# Patient Record
Sex: Male | Born: 1950 | Race: Black or African American | Hispanic: No | Smoking: Current some day smoker
Health system: Southern US, Community
[De-identification: ages and names within clinical notes are randomized; demographics above are authoritative.]

## PROBLEM LIST (undated history)

## (undated) DIAGNOSIS — I82402 Acute embolism and thrombosis of unspecified deep veins of left lower extremity: Secondary | ICD-10-CM

## (undated) DIAGNOSIS — R0902 Hypoxemia: Secondary | ICD-10-CM

## (undated) DIAGNOSIS — M549 Dorsalgia, unspecified: Secondary | ICD-10-CM

## (undated) DIAGNOSIS — G8929 Other chronic pain: Secondary | ICD-10-CM

## (undated) DIAGNOSIS — J439 Emphysema, unspecified: Secondary | ICD-10-CM

## (undated) DIAGNOSIS — G4700 Insomnia, unspecified: Secondary | ICD-10-CM

## (undated) DIAGNOSIS — J9620 Acute and chronic respiratory failure, unspecified whether with hypoxia or hypercapnia: Secondary | ICD-10-CM

## (undated) DIAGNOSIS — M25559 Pain in unspecified hip: Secondary | ICD-10-CM

## (undated) DIAGNOSIS — S069X9A Unspecified intracranial injury with loss of consciousness of unspecified duration, initial encounter: Secondary | ICD-10-CM

## (undated) DIAGNOSIS — M109 Gout, unspecified: Secondary | ICD-10-CM

## (undated) DIAGNOSIS — J302 Other seasonal allergic rhinitis: Secondary | ICD-10-CM

## (undated) DIAGNOSIS — I2510 Atherosclerotic heart disease of native coronary artery without angina pectoris: Secondary | ICD-10-CM

## (undated) DIAGNOSIS — K5900 Constipation, unspecified: Secondary | ICD-10-CM

## (undated) DIAGNOSIS — C801 Malignant (primary) neoplasm, unspecified: Secondary | ICD-10-CM

## (undated) DIAGNOSIS — Z87442 Personal history of urinary calculi: Secondary | ICD-10-CM

## (undated) DIAGNOSIS — G4730 Sleep apnea, unspecified: Secondary | ICD-10-CM

## (undated) DIAGNOSIS — N400 Enlarged prostate without lower urinary tract symptoms: Secondary | ICD-10-CM

## (undated) DIAGNOSIS — M1990 Unspecified osteoarthritis, unspecified site: Secondary | ICD-10-CM

## (undated) DIAGNOSIS — R0600 Dyspnea, unspecified: Secondary | ICD-10-CM

## (undated) DIAGNOSIS — Z87891 Personal history of nicotine dependence: Secondary | ICD-10-CM

## (undated) DIAGNOSIS — I2699 Other pulmonary embolism without acute cor pulmonale: Secondary | ICD-10-CM

## (undated) DIAGNOSIS — F419 Anxiety disorder, unspecified: Secondary | ICD-10-CM

## (undated) DIAGNOSIS — Z9981 Dependence on supplemental oxygen: Secondary | ICD-10-CM

## (undated) DIAGNOSIS — N2889 Other specified disorders of kidney and ureter: Secondary | ICD-10-CM

## (undated) DIAGNOSIS — D649 Anemia, unspecified: Secondary | ICD-10-CM

## (undated) DIAGNOSIS — J9601 Acute respiratory failure with hypoxia: Secondary | ICD-10-CM

## (undated) DIAGNOSIS — R310 Gross hematuria: Secondary | ICD-10-CM

## (undated) DIAGNOSIS — R0603 Acute respiratory distress: Secondary | ICD-10-CM

## (undated) DIAGNOSIS — I10 Essential (primary) hypertension: Secondary | ICD-10-CM

## (undated) DIAGNOSIS — I700 Atherosclerosis of aorta: Secondary | ICD-10-CM

## (undated) DIAGNOSIS — J441 Chronic obstructive pulmonary disease with (acute) exacerbation: Secondary | ICD-10-CM

## (undated) DIAGNOSIS — K219 Gastro-esophageal reflux disease without esophagitis: Secondary | ICD-10-CM

## (undated) DIAGNOSIS — J189 Pneumonia, unspecified organism: Secondary | ICD-10-CM

## (undated) DIAGNOSIS — R7303 Prediabetes: Secondary | ICD-10-CM

## (undated) HISTORY — DX: Other pulmonary embolism without acute cor pulmonale: I26.99

## (undated) HISTORY — DX: Gross hematuria: R31.0

## (undated) HISTORY — DX: Chronic obstructive pulmonary disease with (acute) exacerbation: J44.1

## (undated) HISTORY — DX: Essential (primary) hypertension: I10

## (undated) HISTORY — DX: Sleep apnea, unspecified: G47.30

## (undated) HISTORY — DX: Other specified disorders of kidney and ureter: N28.89

## (undated) HISTORY — DX: Other seasonal allergic rhinitis: J30.2

## (undated) HISTORY — DX: Hypoxemia: R09.02

## (undated) HISTORY — DX: Atherosclerosis of aorta: I70.0

## (undated) HISTORY — DX: Acute respiratory failure with hypoxia: J96.01

## (undated) HISTORY — DX: Anxiety disorder, unspecified: F41.9

## (undated) HISTORY — DX: Personal history of nicotine dependence: Z87.891

## (undated) HISTORY — DX: Acute and chronic respiratory failure, unspecified whether with hypoxia or hypercapnia: J96.20

## (undated) HISTORY — DX: Atherosclerotic heart disease of native coronary artery without angina pectoris: I25.10

## (undated) HISTORY — DX: Gastro-esophageal reflux disease without esophagitis: K21.9

## (undated) HISTORY — DX: Acute respiratory distress: R06.03

## (undated) HISTORY — DX: Acute embolism and thrombosis of unspecified deep veins of left lower extremity: I82.402

## (undated) HISTORY — DX: Benign prostatic hyperplasia without lower urinary tract symptoms: N40.0

## (undated) HISTORY — DX: Gout, unspecified: M10.9

---

## 1974-07-02 HISTORY — PX: MANDIBLE SURGERY: SHX707

## 1975-07-03 HISTORY — PX: HERNIA REPAIR: SHX51

## 2014-04-22 ENCOUNTER — Emergency Department (HOSPITAL_COMMUNITY)
Admission: EM | Admit: 2014-04-22 | Discharge: 2014-04-22 | Discharge status: Absent without leave | Payer: No Typology Code available for payment source | Source: Home / Self Care | Attending: Emergency Medicine | Admitting: Emergency Medicine

## 2014-04-22 ENCOUNTER — Emergency Department (HOSPITAL_COMMUNITY): Payer: No Typology Code available for payment source

## 2014-04-22 ENCOUNTER — Emergency Department (HOSPITAL_COMMUNITY)
Admission: EM | Admit: 2014-04-22 | Discharge: 2014-04-22 | Disposition: A | Discharge status: Home / Self Care | Payer: No Typology Code available for payment source | Attending: Emergency Medicine | Admitting: Emergency Medicine

## 2014-04-22 ENCOUNTER — Encounter (HOSPITAL_COMMUNITY): Payer: Self-pay | Admitting: Emergency Medicine

## 2014-04-22 DIAGNOSIS — R079 Chest pain, unspecified: Secondary | ICD-10-CM | POA: Insufficient documentation

## 2014-04-22 DIAGNOSIS — J441 Chronic obstructive pulmonary disease with (acute) exacerbation: Secondary | ICD-10-CM | POA: Insufficient documentation

## 2014-04-22 DIAGNOSIS — Z8739 Personal history of other diseases of the musculoskeletal system and connective tissue: Secondary | ICD-10-CM | POA: Diagnosis not present

## 2014-04-22 DIAGNOSIS — G8929 Other chronic pain: Secondary | ICD-10-CM | POA: Insufficient documentation

## 2014-04-22 DIAGNOSIS — Z720 Tobacco use: Secondary | ICD-10-CM | POA: Diagnosis not present

## 2014-04-22 DIAGNOSIS — R0602 Shortness of breath: Secondary | ICD-10-CM | POA: Diagnosis present

## 2014-04-22 HISTORY — DX: Unspecified osteoarthritis, unspecified site: M19.90

## 2014-04-22 HISTORY — DX: Other chronic pain: G89.29

## 2014-04-22 HISTORY — DX: Pain in unspecified hip: M25.559

## 2014-04-22 HISTORY — DX: Dorsalgia, unspecified: M54.9

## 2014-04-22 LAB — I-STAT TROPONIN, ED
Troponin i, poc: 0 ng/mL (ref 0.00–0.08)
Troponin i, poc: 0 ng/mL (ref 0.00–0.08)

## 2014-04-22 LAB — BASIC METABOLIC PANEL
Anion gap: 13 (ref 5–15)
BUN: 16 mg/dL (ref 6–23)
CALCIUM: 9.2 mg/dL (ref 8.4–10.5)
CO2: 19 mEq/L (ref 19–32)
CREATININE: 1.2 mg/dL (ref 0.50–1.35)
Chloride: 102 mEq/L (ref 96–112)
GFR calc Af Amer: 73 mL/min — ABNORMAL LOW (ref >90)
GFR, EST NON AFRICAN AMERICAN: 63 mL/min — AB (ref >90)
GLUCOSE: 113 mg/dL — AB (ref 70–99)
Potassium: 4.4 mEq/L (ref 3.7–5.3)
Sodium: 134 mEq/L — ABNORMAL LOW (ref 137–147)

## 2014-04-22 LAB — CBC
HEMATOCRIT: 48.7 % (ref 39.0–52.0)
Hemoglobin: 16.6 g/dL (ref 13.0–17.0)
MCH: 30 pg (ref 26.0–34.0)
MCHC: 34.1 g/dL (ref 30.0–36.0)
MCV: 87.9 fL (ref 78.0–100.0)
Platelets: 134 10*3/uL — ABNORMAL LOW (ref 150–400)
RBC: 5.54 MIL/uL (ref 4.22–5.81)
RDW: 13.4 % (ref 11.5–15.5)
WBC: 4.1 10*3/uL (ref 4.0–10.5)

## 2014-04-22 MED ORDER — AZITHROMYCIN 250 MG PO TABS: 250.0000 mg | ORAL_TABLET | Freq: Every day | ORAL | Status: DC

## 2014-04-22 MED ORDER — ALBUTEROL (5 MG/ML) CONTINUOUS INHALATION SOLN
10.0000 mg/h | INHALATION_SOLUTION | Freq: Once | RESPIRATORY_TRACT | Status: AC
  Administered 2014-04-22: 10 mg/h via RESPIRATORY_TRACT
  Filled 2014-04-22: qty 20

## 2014-04-22 MED ORDER — ALBUTEROL SULFATE HFA 108 (90 BASE) MCG/ACT IN AERS
INHALATION_SPRAY | RESPIRATORY_TRACT | Status: AC
  Filled 2014-04-22: qty 6.7

## 2014-04-22 MED ORDER — IPRATROPIUM BROMIDE 0.02 % IN SOLN: 0.5000 mg | Freq: Once | RESPIRATORY_TRACT | Status: DC

## 2014-04-22 MED ORDER — PREDNISONE 20 MG PO TABS
60.0000 mg | ORAL_TABLET | Freq: Once | ORAL | Status: AC
  Administered 2014-04-22: 60 mg via ORAL
  Filled 2014-04-22: qty 3

## 2014-04-22 MED ORDER — PREDNISONE 20 MG PO TABS: 60.0000 mg | ORAL_TABLET | Freq: Once | ORAL | Status: DC

## 2014-04-22 MED ORDER — IPRATROPIUM BROMIDE 0.02 % IN SOLN
0.5000 mg | Freq: Once | RESPIRATORY_TRACT | Status: AC
  Administered 2014-04-22: 0.5 mg via RESPIRATORY_TRACT
  Filled 2014-04-22: qty 2.5

## 2014-04-22 MED ORDER — ALBUTEROL (5 MG/ML) CONTINUOUS INHALATION SOLN: 10.0000 mg/h | INHALATION_SOLUTION | Freq: Once | RESPIRATORY_TRACT | Status: DC

## 2014-04-22 MED ORDER — PREDNISONE 50 MG PO TABS: 50.0000 mg | ORAL_TABLET | Freq: Every day | ORAL | Status: DC

## 2014-04-22 NOTE — Discharge Instructions (Signed)

## 2014-04-22 NOTE — ED Notes (Signed)
Did a EKG when patient arrived and shoe it to the MD however I had to redo it

## 2014-04-22 NOTE — ED Notes (Addendum)
Pt reports productive cough, SOB, and left side chest pain for 2 weeks. Pt reports when he states "sometimes sees black spots." Pt reports hx of strained chest wall muscle in 1997/98.

## 2014-04-22 NOTE — Progress Notes (Signed)
Pt confirms pcp is [facility] [place] Dr Dorrene German

## 2014-04-22 NOTE — ED Notes (Signed)
Respiratory called and verbalizes will administer breathing treatment.

## 2014-04-22 NOTE — ED Notes (Signed)
Pt states he was on his way to the [place] for his appt but called them to tell them that he was having CP and SOB x 2 wks.  PT was advised to come to the ER.  Denies NVD.  Pt appears to be in NAD.  Breathing easily in triage.  Completing sentences.

## 2014-04-22 NOTE — ED Notes (Signed)
Pt states he [facility] way to [facility] when he called them and told them that he has been having CP and SOB x 2 wks.  Pt states that they told him to come to the ER instead.
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
## 2014-04-24 NOTE — ED Provider Notes (Signed)
CSN: [id number]     Arrival date & time 04/22/14  1030 History   First MD Initiated Contact with Patient 04/22/14 1036     Chief Complaint  Patient presents with  . Chest Pain  . Shortness of Breath     (Consider location/radiation/quality/duration/timing/severity/associated sxs/prior Treatment) HPI Comments: Pt comes to the ER with cc of chest pain and dib. He is an active smoker, heavy. Pt has no known CAD hx. Reports that over the past few days, he has been having some wheezing and dib, worse with inspiration. There is chest pain - midsternal, worse with inspiration. + cough. No fevers, chills.  Patient is a 63 y.o. male presenting with chest pain and shortness of breath. The history is provided by the patient.  Chest Pain Associated symptoms: cough and shortness of breath   Associated symptoms: no abdominal pain   Shortness of Breath Associated symptoms: chest pain and cough   Associated symptoms: no abdominal pain     Past Medical History  Diagnosis Date  . Chronic back pain   . Chronic hip pain   . Arthritis    History reviewed. No pertinent past surgical history. History reviewed. No pertinent family history. History  Substance Use Topics  . Smoking status: Current Every Day Smoker  . Smokeless tobacco: Not on file  . Alcohol Use: Yes    Review of Systems  Constitutional: Negative for activity change and appetite change.  Respiratory: Positive for cough and shortness of breath.   Cardiovascular: Positive for chest pain.  Gastrointestinal: Negative for abdominal pain.  Genitourinary: Negative for dysuria.  Allergic/Immunologic: Negative for immunocompromised state.      Allergies  Review of patient's allergies indicates no known allergies.  Home Medications   Prior to Admission medications   Not on File   BP 151/98  Pulse 99  Temp(Src) 97.4 F (36.3 C) (Oral)  Resp 18  SpO2 100% Physical Exam  Nursing note and vitals reviewed. Constitutional: He  is oriented to person, place, and time. He appears well-developed.  HENT:  Head: Normocephalic and atraumatic.  Eyes: Conjunctivae and EOM are normal. Pupils are equal, round, and reactive to light.  Neck: Normal range of motion. Neck supple. No JVD present.  Cardiovascular: Normal rate and regular rhythm.   Pulmonary/Chest: Effort normal. He has wheezes. He has no rales.  Abdominal: Soft. Bowel sounds are normal. He exhibits no distension. There is no tenderness. There is no rebound and no guarding.  Neurological: He is alert and oriented to person, place, and time.  Skin: Skin is warm.    ED Course  Procedures (including critical care time) Labs Review Labs Reviewed - No data to display  Imaging Review No results found.   EKG Interpretation   Date/Time:  Thursday April 22 2014 10:34:22 EDT Ventricular Rate:  101 PR Interval:  153 QRS Duration: 84 QT Interval:  353 QTC Calculation: 457 R Axis:   -64 Text Interpretation:  Sinus tachycardia Biatrial enlargement Abnormal  R-wave progression, late transition Nonspecific T abnrm, anterolateral  leads Confirmed by Kathrynn Humble, MD, Thelma Comp [id number]) on 04/22/2014 10:37:34 AM      MDM   Final diagnoses:  None  COPD exacerbation  PT comes in with cc of dyspnea. With wheezing, and pleuritic type chest pain and neg CXR, appears to be COPD - and in our patient's case, new diagnosis. Doctors are at the [place]. PT given breathing tx here, and his lung exam improved. No resp distress, hypoxia. Will treat as  COPD, and return precautions discusses. A albuterol inhaler was provided, along with COPD exacerbation meds.    Varney Biles, MD 04/24/14 [phone]

## 2015-05-14 ENCOUNTER — Emergency Department (HOSPITAL_COMMUNITY): Payer: No Typology Code available for payment source

## 2015-05-14 ENCOUNTER — Emergency Department (HOSPITAL_COMMUNITY)
Admission: EM | Admit: 2015-05-14 | Discharge: 2015-05-15 | Disposition: A | Discharge status: Home / Self Care | Payer: No Typology Code available for payment source | Attending: Emergency Medicine | Admitting: Emergency Medicine

## 2015-05-14 ENCOUNTER — Encounter (HOSPITAL_COMMUNITY): Payer: Self-pay | Admitting: Emergency Medicine

## 2015-05-14 DIAGNOSIS — S20212A Contusion of left front wall of thorax, initial encounter: Secondary | ICD-10-CM | POA: Insufficient documentation

## 2015-05-14 DIAGNOSIS — G8929 Other chronic pain: Secondary | ICD-10-CM | POA: Insufficient documentation

## 2015-05-14 DIAGNOSIS — Y9289 Other specified places as the place of occurrence of the external cause: Secondary | ICD-10-CM | POA: Insufficient documentation

## 2015-05-14 DIAGNOSIS — Z7952 Long term (current) use of systemic steroids: Secondary | ICD-10-CM | POA: Insufficient documentation

## 2015-05-14 DIAGNOSIS — Y9389 Activity, other specified: Secondary | ICD-10-CM | POA: Insufficient documentation

## 2015-05-14 DIAGNOSIS — Z79899 Other long term (current) drug therapy: Secondary | ICD-10-CM | POA: Insufficient documentation

## 2015-05-14 DIAGNOSIS — Y998 Other external cause status: Secondary | ICD-10-CM | POA: Insufficient documentation

## 2015-05-14 DIAGNOSIS — Z720 Tobacco use: Secondary | ICD-10-CM | POA: Insufficient documentation

## 2015-05-14 DIAGNOSIS — W500XXA Accidental hit or strike by another person, initial encounter: Secondary | ICD-10-CM | POA: Insufficient documentation

## 2015-05-14 DIAGNOSIS — J441 Chronic obstructive pulmonary disease with (acute) exacerbation: Secondary | ICD-10-CM | POA: Insufficient documentation

## 2015-05-14 LAB — BASIC METABOLIC PANEL
Anion gap: 10 (ref 5–15)
BUN: 15 mg/dL (ref 6–20)
CO2: 22 mmol/L (ref 22–32)
Calcium: 9.4 mg/dL (ref 8.9–10.3)
Chloride: 103 mmol/L (ref 101–111)
Creatinine, Ser: 1.1 mg/dL (ref 0.61–1.24)
GFR calc Af Amer: >60 mL/min (ref >60)
GFR calc non Af Amer: >60 mL/min (ref >60)
Glucose, Bld: 119 mg/dL — ABNORMAL HIGH (ref 65–99)
POTASSIUM: 4.8 mmol/L (ref 3.5–5.1)
Sodium: 135 mmol/L (ref 135–145)

## 2015-05-14 LAB — CBC
HCT: 48.1 % (ref 39.0–52.0)
HEMOGLOBIN: 16.9 g/dL (ref 13.0–17.0)
MCH: 31.6 pg (ref 26.0–34.0)
MCHC: 35.1 g/dL (ref 30.0–36.0)
MCV: 89.9 fL (ref 78.0–100.0)
Platelets: 153 10*3/uL (ref 150–400)
RBC: 5.35 MIL/uL (ref 4.22–5.81)
RDW: 14.3 % (ref 11.5–15.5)
WBC: 4.2 10*3/uL (ref 4.0–10.5)

## 2015-05-14 LAB — I-STAT TROPONIN, ED: Troponin i, poc: 0 ng/mL (ref 0.00–0.08)

## 2015-05-14 MED ORDER — KETOROLAC TROMETHAMINE 60 MG/2ML IM SOLN
60.0000 mg | Freq: Once | INTRAMUSCULAR | Status: AC
  Administered 2015-05-15: 60 mg via INTRAMUSCULAR
  Filled 2015-05-14: qty 2

## 2015-05-14 MED ORDER — ALBUTEROL SULFATE (2.5 MG/3ML) 0.083% IN NEBU
5.0000 mg | INHALATION_SOLUTION | Freq: Once | RESPIRATORY_TRACT | Status: AC
  Administered 2015-05-14: 5 mg via RESPIRATORY_TRACT
  Filled 2015-05-14: qty 6

## 2015-05-14 MED ORDER — PREDNISONE 20 MG PO TABS
60.0000 mg | ORAL_TABLET | Freq: Once | ORAL | Status: AC
  Administered 2015-05-15: 60 mg via ORAL
  Filled 2015-05-14: qty 3

## 2015-05-14 MED ORDER — METHOCARBAMOL 500 MG PO TABS
1000.0000 mg | ORAL_TABLET | Freq: Once | ORAL | Status: AC
  Administered 2015-05-15: 1000 mg via ORAL
  Filled 2015-05-14: qty 2

## 2015-05-14 MED ORDER — IPRATROPIUM BROMIDE 0.02 % IN SOLN
0.5000 mg | Freq: Once | RESPIRATORY_TRACT | Status: AC
  Administered 2015-05-14: 0.5 mg via RESPIRATORY_TRACT
  Filled 2015-05-14: qty 2.5
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
## 2015-05-14 NOTE — ED Notes (Signed)
Pt from home c/o chest pain x 3 weeks. Pt in lobby "passing out in lobby" while sitting in a wheelchair pt awakes to sternal rubs. Pt was taken back to triage room and "passes out" again and but proceeds to scoot feet and bottom out of wheel chair. Pt arouses to name being called and asking if he had alcohol. He loudly states "I only had 6 drinks today" and stands self up and sits in chair. Pt answering questions sarcastically. He reports he was seen at [facility] on Wednesday for same.

## 2015-05-15 ENCOUNTER — Encounter (HOSPITAL_COMMUNITY): Payer: Self-pay | Admitting: Emergency Medicine

## 2015-05-15 MED ORDER — PREDNISONE 20 MG PO TABS: ORAL_TABLET | ORAL | Status: DC

## 2015-05-15 MED ORDER — MELOXICAM 7.5 MG PO TABS: 7.5000 mg | ORAL_TABLET | Freq: Every day | ORAL | Status: DC

## 2015-05-15 MED ORDER — DOXYCYCLINE HYCLATE 100 MG PO CAPS: 100.0000 mg | ORAL_CAPSULE | Freq: Two times a day (BID) | ORAL | Status: DC

## 2015-05-15 MED ORDER — IPRATROPIUM-ALBUTEROL 18-103 MCG/ACT IN AERO: 2.0000 | INHALATION_SPRAY | RESPIRATORY_TRACT | Status: DC | PRN

## 2015-05-18 ENCOUNTER — Encounter (HOSPITAL_COMMUNITY): Payer: Self-pay | Admitting: Emergency Medicine

## 2015-05-18 ENCOUNTER — Emergency Department (HOSPITAL_COMMUNITY)
Admission: EM | Admit: 2015-05-18 | Discharge: 2015-05-18 | Disposition: A | Discharge status: Home / Self Care | Payer: No Typology Code available for payment source | Attending: Emergency Medicine | Admitting: Emergency Medicine

## 2015-05-18 ENCOUNTER — Emergency Department (HOSPITAL_COMMUNITY): Payer: No Typology Code available for payment source

## 2015-05-18 DIAGNOSIS — S29001A Unspecified injury of muscle and tendon of front wall of thorax, initial encounter: Secondary | ICD-10-CM | POA: Insufficient documentation

## 2015-05-18 DIAGNOSIS — Z79899 Other long term (current) drug therapy: Secondary | ICD-10-CM | POA: Insufficient documentation

## 2015-05-18 DIAGNOSIS — R062 Wheezing: Secondary | ICD-10-CM

## 2015-05-18 DIAGNOSIS — Z7951 Long term (current) use of inhaled steroids: Secondary | ICD-10-CM | POA: Insufficient documentation

## 2015-05-18 DIAGNOSIS — R0781 Pleurodynia: Secondary | ICD-10-CM

## 2015-05-18 DIAGNOSIS — W51XXXA Accidental striking against or bumped into by another person, initial encounter: Secondary | ICD-10-CM | POA: Insufficient documentation

## 2015-05-18 DIAGNOSIS — Y9289 Other specified places as the place of occurrence of the external cause: Secondary | ICD-10-CM | POA: Insufficient documentation

## 2015-05-18 DIAGNOSIS — Y998 Other external cause status: Secondary | ICD-10-CM | POA: Insufficient documentation

## 2015-05-18 DIAGNOSIS — Z791 Long term (current) use of non-steroidal anti-inflammatories (NSAID): Secondary | ICD-10-CM | POA: Insufficient documentation

## 2015-05-18 DIAGNOSIS — M1990 Unspecified osteoarthritis, unspecified site: Secondary | ICD-10-CM | POA: Insufficient documentation

## 2015-05-18 DIAGNOSIS — G8929 Other chronic pain: Secondary | ICD-10-CM | POA: Insufficient documentation

## 2015-05-18 DIAGNOSIS — Z7952 Long term (current) use of systemic steroids: Secondary | ICD-10-CM | POA: Insufficient documentation

## 2015-05-18 DIAGNOSIS — F17200 Nicotine dependence, unspecified, uncomplicated: Secondary | ICD-10-CM | POA: Insufficient documentation

## 2015-05-18 DIAGNOSIS — Y9389 Activity, other specified: Secondary | ICD-10-CM | POA: Insufficient documentation

## 2015-05-18 DIAGNOSIS — J439 Emphysema, unspecified: Secondary | ICD-10-CM | POA: Insufficient documentation

## 2015-05-18 LAB — BASIC METABOLIC PANEL
ANION GAP: 10 (ref 5–15)
BUN: 23 mg/dL — AB (ref 6–20)
CHLORIDE: 105 mmol/L (ref 101–111)
CO2: 24 mmol/L (ref 22–32)
Calcium: 9.8 mg/dL (ref 8.9–10.3)
Creatinine, Ser: 1.48 mg/dL — ABNORMAL HIGH (ref 0.61–1.24)
GFR calc Af Amer: 56 mL/min — ABNORMAL LOW (ref >60)
GFR calc non Af Amer: 49 mL/min — ABNORMAL LOW (ref >60)
GLUCOSE: 186 mg/dL — AB (ref 65–99)
POTASSIUM: 4.4 mmol/L (ref 3.5–5.1)
Sodium: 139 mmol/L (ref 135–145)

## 2015-05-18 LAB — CBC
HEMATOCRIT: 48.7 % (ref 39.0–52.0)
HEMOGLOBIN: 16.4 g/dL (ref 13.0–17.0)
MCH: 30.8 pg (ref 26.0–34.0)
MCHC: 33.7 g/dL (ref 30.0–36.0)
MCV: 91.4 fL (ref 78.0–100.0)
Platelets: 149 10*3/uL — ABNORMAL LOW (ref 150–400)
RBC: 5.33 MIL/uL (ref 4.22–5.81)
RDW: 14.2 % (ref 11.5–15.5)
WBC: 6.6 10*3/uL (ref 4.0–10.5)

## 2015-05-18 LAB — I-STAT TROPONIN, ED: Troponin i, poc: 0 ng/mL (ref 0.00–0.08)

## 2015-05-18 MED ORDER — HYDROCODONE-ACETAMINOPHEN 5-325 MG PO TABS
2.0000 | ORAL_TABLET | Freq: Once | ORAL | Status: AC
  Administered 2015-05-18: 2 via ORAL
  Filled 2015-05-18: qty 2

## 2015-05-18 MED ORDER — ALBUTEROL SULFATE (2.5 MG/3ML) 0.083% IN NEBU
5.0000 mg | INHALATION_SOLUTION | Freq: Once | RESPIRATORY_TRACT | Status: AC
  Administered 2015-05-18: 5 mg via RESPIRATORY_TRACT
  Filled 2015-05-18: qty 6

## 2015-05-18 MED ORDER — PREDNISONE 20 MG PO TABS
60.0000 mg | ORAL_TABLET | Freq: Once | ORAL | Status: AC
  Administered 2015-05-18: 60 mg via ORAL
  Filled 2015-05-18: qty 3

## 2015-05-18 MED ORDER — IPRATROPIUM BROMIDE 0.02 % IN SOLN
0.5000 mg | Freq: Once | RESPIRATORY_TRACT | Status: AC
  Administered 2015-05-18: 0.5 mg via RESPIRATORY_TRACT
  Filled 2015-05-18: qty 2.5

## 2015-05-18 MED ORDER — HYDROCODONE-ACETAMINOPHEN 5-325 MG PO TABS: 1.0000 | ORAL_TABLET | Freq: Four times a day (QID) | ORAL | Status: DC | PRN
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
## 2015-05-18 NOTE — Discharge Instructions (Signed)
Bronchospasm, Adult A bronchospasm is when the tubes that carry air in and out of your lungs (airways) spasm or tighten. During a bronchospasm it is hard to breathe. This is because the airways get smaller. A bronchospasm can be triggered by:  Allergies. These may be to animals, pollen, food, or mold.  Infection. This is a common cause of bronchospasm.  Exercise.  Irritants. These include pollution, cigarette smoke, strong odors, aerosol sprays, and paint fumes.  Weather changes.  Stress.  Being emotional. HOME CARE   Always have a plan for getting help. Know when to call your doctor and local emergency services (911 in the [place] Know where you can get emergency care.  Only take medicines as told by your doctor.  If you were prescribed an inhaler or nebulizer machine, ask your doctor how to use it correctly. Always use a spacer with your inhaler if you were given one.  Stay calm during an attack. Try to relax and breathe more slowly.  Control your home environment:  Change your heating and air conditioning filter at least once a month.  Limit your use of fireplaces and wood stoves.  Do not  smoke. Do not  allow smoking in your home.  Avoid perfumes and fragrances.  Get rid of pests (such as roaches and mice) and their droppings.  Throw away plants if you see mold on them.  Keep your house clean and dust free.  Replace carpet with wood, tile, or vinyl flooring. Carpet can trap dander and dust.  Use allergy-proof pillows, mattress covers, and box spring covers.  Wash bed sheets and blankets every week in hot water. Dry them in a dryer.  Use blankets that are made of polyester or cotton.  Wash hands frequently. GET HELP IF:  You have muscle aches.  You have chest pain.  The thick spit you spit or cough up (sputum) changes from clear or white to yellow, green, gray, or bloody.  The thick spit you spit or cough up gets thicker.  There are problems that may be  related to the medicine you are given such as:  A rash.  Itching.  Swelling.  Trouble breathing. GET HELP RIGHT AWAY IF:  You feel you cannot breathe or catch your breath.  You cannot stop coughing.  Your treatment is not helping you breathe better.  You have very bad chest pain. MAKE SURE YOU:   Understand these instructions.  Will watch your condition.  Will get help right away if you are not doing well or get worse.   This information is not intended to replace advice given to you by your health care provider. Make sure you discuss any questions you have with your health care provider.   Document Released: 04/15/2009 Document Revised: 07/09/2014 Document Reviewed: 12/09/2012 Elsevier Interactive Patient Education 2016 [employer]. Rib Fracture A rib fracture is a break or crack in one of the bones of the ribs. The ribs are a group of long, curved bones that wrap around your chest and attach to your spine. They protect your lungs and other organs in the chest cavity. A broken or cracked rib is often painful, but most do not cause other problems. Most rib fractures heal on their own over time. However, rib fractures can be more serious if multiple ribs are broken or if broken ribs move out of place and push against other structures. CAUSES   A direct blow to the chest. For example, this could happen during contact sports,  a car accident, or a fall against a hard object.  Repetitive movements with high force, such as pitching a baseball or having severe coughing spells. SYMPTOMS   Pain when you breathe in or cough.  Pain when someone presses on the injured area. DIAGNOSIS  Your caregiver will perform a physical exam. Various imaging tests may be ordered to confirm the diagnosis and to look for related injuries. These tests may include a chest X-ray, computed tomography (CT), magnetic resonance imaging (MRI), or a bone scan. TREATMENT  Rib fractures usually heal on their  own in 1-3 months. The longer healing period is often associated with a continued cough or other aggravating activities. During the healing period, pain control is very important. Medication is usually given to control pain. Hospitalization or surgery may be needed for more severe injuries, such as those in which multiple ribs are broken or the ribs have moved out of place.  HOME CARE INSTRUCTIONS   Avoid strenuous activity and any activities or movements that cause pain. Be careful during activities and avoid bumping the injured rib.  Gradually increase activity as directed by your caregiver.  Only take over-the-counter or prescription medications as directed by your caregiver. Do not take other medications without asking your caregiver first.  Apply ice to the injured area for the first 1-2 days after you have been treated or as directed by your caregiver. Applying ice helps to reduce inflammation and pain.  Put ice in a plastic bag.  Place a towel between your skin and the bag.   Leave the ice on for 15-20 minutes at a time, every 2 hours while you are awake.  Perform deep breathing as directed by your caregiver. This will help prevent pneumonia, which is a common complication of a broken rib. Your caregiver may instruct you to:  Take deep breaths several times a day.  Try to cough several times a day, holding a pillow against the injured area.  Use a device called an incentive spirometer to practice deep breathing several times a day.  Drink enough fluids to keep your urine clear or pale yellow. This will help you avoid constipation.   Do not wear a rib belt or binder. These restrict breathing, which can lead to pneumonia.  SEEK IMMEDIATE MEDICAL CARE IF:   You have a fever.   You have difficulty breathing or shortness of breath.   You develop a continual cough, or you cough up thick or bloody sputum.  You feel sick to your stomach (nausea), throw up (vomit), or have  abdominal pain.   You have worsening pain not controlled with medications.  MAKE SURE YOU:  Understand these instructions.  Will watch your condition.  Will get help right away if you are not doing well or get worse.   This information is not intended to replace advice given to you by your health care provider. Make sure you discuss any questions you have with your health care provider.   Document Released: 06/18/2005 Document Revised: 02/18/2013 Document Reviewed: 08/20/2012 Elsevier Interactive Patient Education [employer].

## 2015-05-18 NOTE — ED Notes (Signed)
AVS explained in detail. Knows to rest/use ice/use heat. Knows not to drive/drink ETOH with Vicodin prescription. No other c/c. RR even/unlabored. Ambulatory with steady gait.

## 2016-06-11 DIAGNOSIS — R938 Abnormal findings on diagnostic imaging of other specified body structures: Secondary | ICD-10-CM | POA: Diagnosis not present

## 2016-06-11 DIAGNOSIS — I10 Essential (primary) hypertension: Secondary | ICD-10-CM | POA: Diagnosis not present

## 2016-06-11 DIAGNOSIS — J449 Chronic obstructive pulmonary disease, unspecified: Secondary | ICD-10-CM | POA: Diagnosis not present

## 2016-06-11 DIAGNOSIS — Z720 Tobacco use: Secondary | ICD-10-CM | POA: Diagnosis not present

## 2016-06-11 DIAGNOSIS — M1990 Unspecified osteoarthritis, unspecified site: Secondary | ICD-10-CM | POA: Diagnosis not present

## 2016-07-18 ENCOUNTER — Ambulatory Visit: Payer: No Typology Code available for payment source | Admitting: Internal Medicine

## 2016-11-29 ENCOUNTER — Emergency Department (HOSPITAL_COMMUNITY)
Admission: EM | Admit: 2016-11-29 | Discharge: 2016-11-29 | Disposition: A | Discharge status: Home / Self Care | Payer: Medicare HMO | Attending: Emergency Medicine | Admitting: Emergency Medicine

## 2016-11-29 ENCOUNTER — Emergency Department (HOSPITAL_COMMUNITY): Payer: Medicare HMO

## 2016-11-29 ENCOUNTER — Encounter (HOSPITAL_COMMUNITY): Payer: Self-pay

## 2016-11-29 DIAGNOSIS — F17200 Nicotine dependence, unspecified, uncomplicated: Secondary | ICD-10-CM | POA: Diagnosis not present

## 2016-11-29 DIAGNOSIS — R1013 Epigastric pain: Secondary | ICD-10-CM

## 2016-11-29 DIAGNOSIS — R0789 Other chest pain: Secondary | ICD-10-CM | POA: Diagnosis not present

## 2016-11-29 DIAGNOSIS — K30 Functional dyspepsia: Secondary | ICD-10-CM | POA: Insufficient documentation

## 2016-11-29 DIAGNOSIS — R079 Chest pain, unspecified: Secondary | ICD-10-CM | POA: Diagnosis not present

## 2016-11-29 DIAGNOSIS — R0602 Shortness of breath: Secondary | ICD-10-CM | POA: Diagnosis not present

## 2016-11-29 DIAGNOSIS — Z720 Tobacco use: Secondary | ICD-10-CM | POA: Diagnosis not present

## 2016-11-29 DIAGNOSIS — J441 Chronic obstructive pulmonary disease with (acute) exacerbation: Secondary | ICD-10-CM

## 2016-11-29 LAB — BASIC METABOLIC PANEL
Anion gap: 7 (ref 5–15)
BUN: 10 mg/dL (ref 6–20)
CO2: 24 mmol/L (ref 22–32)
CREATININE: 1.13 mg/dL (ref 0.61–1.24)
Calcium: 9 mg/dL (ref 8.9–10.3)
Chloride: 107 mmol/L (ref 101–111)
Glucose, Bld: 118 mg/dL — ABNORMAL HIGH (ref 65–99)
Potassium: 4.3 mmol/L (ref 3.5–5.1)
SODIUM: 138 mmol/L (ref 135–145)

## 2016-11-29 LAB — HEPATIC FUNCTION PANEL
ALBUMIN: 3.7 g/dL (ref 3.5–5.0)
ALK PHOS: 41 U/L (ref 38–126)
ALT: 22 U/L (ref 17–63)
AST: 29 U/L (ref 15–41)
Bilirubin, Direct: <0.1 mg/dL — ABNORMAL LOW (ref 0.1–0.5)
TOTAL PROTEIN: 7.3 g/dL (ref 6.5–8.1)
Total Bilirubin: 0.5 mg/dL (ref 0.3–1.2)

## 2016-11-29 LAB — I-STAT TROPONIN, ED: TROPONIN I, POC: 0 ng/mL (ref 0.00–0.08)

## 2016-11-29 LAB — CBC
HCT: 43.7 % (ref 39.0–52.0)
Hemoglobin: 14.3 g/dL (ref 13.0–17.0)
MCH: 30.5 pg (ref 26.0–34.0)
MCHC: 32.7 g/dL (ref 30.0–36.0)
MCV: 93.2 fL (ref 78.0–100.0)
Platelets: 122 10*3/uL — ABNORMAL LOW (ref 150–400)
RBC: 4.69 MIL/uL (ref 4.22–5.81)
RDW: 14.6 % (ref 11.5–15.5)
WBC: 5.7 10*3/uL (ref 4.0–10.5)

## 2016-11-29 MED ORDER — PREDNISONE 10 MG PO TABS: 60.0000 mg | ORAL_TABLET | Freq: Every day | ORAL | 0 refills | Status: AC

## 2016-11-29 MED ORDER — GI COCKTAIL ~~LOC~~
30.0000 mL | Freq: Once | ORAL | Status: AC
  Administered 2016-11-29: 30 mL via ORAL
  Filled 2016-11-29: qty 30

## 2016-11-29 MED ORDER — PREDNISONE 20 MG PO TABS
60.0000 mg | ORAL_TABLET | Freq: Once | ORAL | Status: AC
  Administered 2016-11-29: 60 mg via ORAL
  Filled 2016-11-29: qty 3

## 2016-11-29 MED ORDER — IPRATROPIUM-ALBUTEROL 0.5-2.5 (3) MG/3ML IN SOLN
3.0000 mL | Freq: Once | RESPIRATORY_TRACT | Status: AC
Start: 2016-11-29 — End: 2016-11-29
  Administered 2016-11-29: 3 mL via RESPIRATORY_TRACT
  Filled 2016-11-29: qty 3

## 2016-11-29 MED ORDER — PANTOPRAZOLE SODIUM 20 MG PO TBEC: 40.0000 mg | DELAYED_RELEASE_TABLET | Freq: Every day | ORAL | 0 refills | Status: DC

## 2016-11-29 NOTE — ED Provider Notes (Signed)
[facility] DEPT Provider Note   CSN: [id number] Arrival date & time: 11/29/16  1231     History   Chief Complaint Chief Complaint  Patient presents with  . Chest Pain    HPI Gregory Christensen is a 66 y.o. male.  Patient with a history of COPD. Not well controlled. Currently on Symbicort and albuterol. Reports that he has been coughing more recently, wheezing. Uses inhalers with improvement in symptoms. Change in mucous production. Denies any fevers or chills. Over the last 4 days, the patient has had right sided chest pain. Sharp in nature, radiates into his back. Reproducible. He also mentions that the pain is worse after he eats, and gets better after taking Pepcid. Denies any nausea, vomiting, diaphoresis. He reports that the chest pain has been constant since it first started 4 days ago.   The history is provided by the patient and a relative.  Illness  This is a new problem. Episode onset: 4 days. The problem occurs constantly. The problem has not changed since onset.Associated symptoms include chest pain and shortness of breath. Pertinent negatives include no abdominal pain and no headaches. Relieved by: pepcid, not eating.    Past Medical History:  Diagnosis Date  . Arthritis   . Chronic back pain   . Chronic hip pain     There are no active problems to display for this patient.   No past surgical history on file.     Home Medications    Prior to Admission medications   Medication Sig Start Date End Date Taking? Authorizing Provider  acetaminophen (TYLENOL) 500 MG tablet Take 1,000 mg by mouth every 6 (six) hours as needed for moderate pain or headache.    [provider]  albuterol (PROVENTIL HFA;VENTOLIN HFA) 108 (90 BASE) MCG/ACT inhaler Inhale 2 puffs into the lungs every 6 (six) hours as needed for wheezing or shortness of breath.    [provider]  albuterol-ipratropium (COMBIVENT) 18-103 MCG/ACT inhaler Inhale 2 puffs into the lungs  every 4 (four) hours as needed for wheezing or shortness of breath. 05/15/15   Palumbo, April, MD  azithromycin (ZITHROMAX) 250 MG tablet Take 1 tablet (250 mg total) by mouth daily. Take first 2 tablets together, then 1 every day until finished. Patient not taking: Reported on 05/18/2015 04/22/14   Varney Biles, MD  budesonide-formoterol [facility]) 160-4.5 MCG/ACT inhaler Inhale 1 puff into the lungs 2 (two) times daily.    [provider]  cetirizine (ZYRTEC) 10 MG tablet Take 10 mg by mouth daily as needed for allergies.    [provider]  doxycycline (VIBRAMYCIN) 100 MG capsule Take 1 capsule (100 mg total) by mouth 2 (two) times daily. One po bid x 7 days 05/15/15   Palumbo, April, MD  fluticasone [facility]) 50 MCG/ACT nasal spray Place 1 spray into both nostrils daily as needed for allergies or rhinitis.    [provider]  HYDROcodone-acetaminophen (NORCO/VICODIN) 5-325 MG tablet Take 1-2 tablets by mouth every 6 (six) hours as needed. 05/18/15   Montine Circle, PA-C  ibuprofen (ADVIL,MOTRIN) 200 MG tablet Take 800 mg by mouth every 6 (six) hours as needed for moderate pain.    [provider]  meloxicam (MOBIC) 7.5 MG tablet Take 1 tablet (7.5 mg total) by mouth daily. 05/15/15   Palumbo, April, MD  methocarbamol (ROBAXIN) 500 MG tablet Take 500 mg by mouth at bedtime as needed for muscle spasms.    [provider]  pantoprazole (PROTONIX) 20 MG  tablet Take 2 tablets (40 mg total) by mouth daily. 11/29/16 12/29/16  Maryan Puls, MD  predniSONE (DELTASONE) 10 MG tablet Take 6 tablets (60 mg total) by mouth daily. 11/29/16 12/04/16  Maryan Puls, MD    Family History No family history on file.  Social History Social History  Substance Use Topics  . Smoking status: Current Every Day Smoker  . Smokeless tobacco: Never Used  . Alcohol use Yes     Allergies   Patient has no known allergies.   Review of Systems Review of Systems    Constitutional: Negative for chills and fever.  Respiratory: Positive for cough, shortness of breath and wheezing.   Cardiovascular: Positive for chest pain. Negative for palpitations and leg swelling.  Gastrointestinal: Negative for abdominal pain, blood in stool, diarrhea, nausea and vomiting.  Genitourinary: Negative for dysuria and frequency.  Neurological: Negative for light-headedness and headaches.  All other systems reviewed and are negative.    Physical Exam Updated Vital Signs BP (!) 150/91 (BP Location: Right Arm)   Pulse 60   Temp 97.6 F (36.4 C) (Oral)   Resp 19   Ht 6\' 1"  (1.854 m)   Wt 69.9 kg (154 lb 1.6 oz)   SpO2 97%   BMI 20.33 kg/m   Physical Exam  Constitutional: He appears well-developed and well-nourished.  HENT:  Head: Normocephalic and atraumatic.  Eyes: Conjunctivae are normal.  Neck: Neck supple.  Cardiovascular: Normal rate and regular rhythm.   No murmur heard. Pulmonary/Chest: Effort normal. No respiratory distress. He has wheezes. He has no rhonchi.  Abdominal: Soft. There is no tenderness. There is no rebound, no guarding, no tenderness at McBurney's point and negative Murphy's sign.  Musculoskeletal: He exhibits no edema.       Right lower leg: He exhibits no swelling and no edema.       Left lower leg: He exhibits no swelling and no edema.  Neurological: He is alert.  Skin: Skin is warm and dry.  Psychiatric: He has a normal mood and affect.  Nursing note and vitals reviewed.    ED Treatments / Results  Labs (all labs ordered are listed, but only abnormal results are displayed) Labs Reviewed  BASIC METABOLIC PANEL - Abnormal; Notable for the following:       Result Value   Glucose, Bld 118 (*)    All other components within normal limits  CBC - Abnormal; Notable for the following:    Platelets 122 (*)    All other components within normal limits  HEPATIC FUNCTION PANEL  I-STAT TROPOININ, ED    EKG  EKG  Interpretation  Date/Time:  Thursday Nov 29 2016 13:28:28 EDT Ventricular Rate:  76 PR Interval:    QRS Duration: 96 QT Interval:  404 QTC Calculation: 455 R Axis:   59 Text Interpretation:  Sinus rhythm No significant change since last tracing Confirmed by [facility] MD, [place] ([id number]) on 11/29/2016 2:56:28 PM       Radiology Dg Chest Portable 1 View  Result Date: 11/29/2016 CLINICAL DATA:  Midline to right sided CP with right arm numbness and posterior left neck pain x 1030 am today, SOB x 3 years, HTN, smoker EXAM: PORTABLE CHEST 1 VIEW COMPARISON:  05/18/2015 FINDINGS: The heart size and mediastinal contours are within normal limits. Both lungs are clear. The visualized skeletal structures are unremarkable. IMPRESSION: No active disease. Electronically Signed   By: Kathreen Devoid   On: 11/29/2016 13:03    Procedures Procedures (including  critical care time)  Medications Ordered in ED Medications  gi cocktail (Maalox,Lidocaine,Donnatal) (30 mLs Oral Given 11/29/16 1326)  ipratropium-albuterol (DUONEB) 0.5-2.5 (3) MG/3ML nebulizer solution 3 mL (3 mLs Nebulization Given 11/29/16 1326)  predniSONE (DELTASONE) tablet 60 mg (60 mg Oral Given 11/29/16 1326)     Initial Impression / Assessment and Plan / ED Course  I have reviewed the triage vital signs and the nursing notes.  Pertinent labs & imaging results that were available during my care of the patient were reviewed by me and considered in my medical decision making (see chart for details).     Pain has been constant for the last 4 days. Sharp in nature. Nonexertional. Worse after eating. Better after taking Pepcid. Feel patient's pain is likely related to dyspepsia. Give the patient a GI cocktail here with improvement in symptoms. We'll start him on Protonix.  Patient was also wheezing diffusely here. Denies shortness of breath at this time. The patient some DuoNeb times and steroids with improvement in symptoms. Chest x-ray  without evidence of focal consolidation. Doubt pneumonia. Encouraged him to follow up with his primary care doctor to change his COPD medications, and gave him a prescription for prednisone. He also does have some tenderness to palpation over his right chest wall. Likely musculoskeletal in nature in the setting of cough and a COPD exacerbation. Given the symptoms of dyspepsia, did not encouraged Motrin, encouraged him to use Tylenol.  EKG here without evidence of ischemic changes or interval abnormalities. Troponin is negative. Given that the patient reports he has had constant pain for 4 days, will not pursue a delta troponin. Doubt ACS in this patient.   CBC without leukocytosis or anemia. Electrolytes within normal limits. Chest x-ray without evidence of pneumothorax or pneumonia. Mediastinum is narrow. Doubt dissection. Description of symptoms without pleuritic component, and vital signs including heart rate and respiratory rate are normal. Doubt pulmonary embolism.  Final diagnoses:  Chest pain, unspecified type  COPD exacerbation (HCC)  Dyspepsia    New Prescriptions New Prescriptions   PANTOPRAZOLE (PROTONIX) 20 MG TABLET    Take 2 tablets (40 mg total) by mouth daily.   PREDNISONE (DELTASONE) 10 MG TABLET    Take 6 tablets (60 mg total) by mouth daily.     Maryan Puls, MD 11/29/16 1551    Gareth Morgan, MD 12/02/16 2224

## 2016-11-29 NOTE — ED Triage Notes (Signed)
Pt reports intermittent right sided chest pain that's described as a sharp stabbing pain.  Pain is non-radiating and pt describes it as gas pain.  Pt given 3 nitro by [facility] and bp dropped to 60 systolic.  Pt given 500cc NS PTA and bp normalized.  PT A&Ox4

## 2017-04-15 ENCOUNTER — Other Ambulatory Visit: Payer: Self-pay | Admitting: Physical Medicine and Rehabilitation

## 2017-04-15 DIAGNOSIS — J449 Chronic obstructive pulmonary disease, unspecified: Secondary | ICD-10-CM

## 2017-05-02 ENCOUNTER — Ambulatory Visit: Payer: Disability Insurance | Attending: Physical Medicine and Rehabilitation

## 2017-05-02 ENCOUNTER — Ambulatory Visit: Payer: No Typology Code available for payment source

## 2017-05-02 DIAGNOSIS — J449 Chronic obstructive pulmonary disease, unspecified: Secondary | ICD-10-CM | POA: Insufficient documentation

## 2017-05-02 MED ORDER — ALBUTEROL SULFATE (2.5 MG/3ML) 0.083% IN NEBU
2.5000 mg | INHALATION_SOLUTION | Freq: Once | RESPIRATORY_TRACT | Status: AC
  Administered 2017-05-02: 2.5 mg via RESPIRATORY_TRACT
  Filled 2017-05-02: qty 3

## 2017-08-23 ENCOUNTER — Other Ambulatory Visit: Payer: Self-pay | Admitting: Internal Medicine

## 2017-08-23 DIAGNOSIS — M4726 Other spondylosis with radiculopathy, lumbar region: Secondary | ICD-10-CM

## 2017-09-03 ENCOUNTER — Ambulatory Visit
Admission: RE | Admit: 2017-09-03 | Discharge: 2017-09-03 | Disposition: A | Discharge status: Home / Self Care | Payer: No Typology Code available for payment source | Source: Ambulatory Visit | Attending: Internal Medicine | Admitting: Internal Medicine

## 2017-09-03 DIAGNOSIS — M4726 Other spondylosis with radiculopathy, lumbar region: Secondary | ICD-10-CM

## 2017-10-18 ENCOUNTER — Emergency Department (HOSPITAL_COMMUNITY): Payer: Medicare HMO

## 2017-10-18 ENCOUNTER — Inpatient Hospital Stay (HOSPITAL_COMMUNITY)
Admission: EM | Admit: 2017-10-18 | Discharge: 2017-10-24 | DRG: 191 | Disposition: A | Discharge status: Home / Self Care | Payer: Medicare HMO | Attending: Internal Medicine | Admitting: Internal Medicine

## 2017-10-18 ENCOUNTER — Encounter (HOSPITAL_COMMUNITY): Payer: Self-pay | Admitting: Emergency Medicine

## 2017-10-18 DIAGNOSIS — Z7951 Long term (current) use of inhaled steroids: Secondary | ICD-10-CM

## 2017-10-18 DIAGNOSIS — Z791 Long term (current) use of non-steroidal anti-inflammatories (NSAID): Secondary | ICD-10-CM

## 2017-10-18 DIAGNOSIS — I129 Hypertensive chronic kidney disease with stage 1 through stage 4 chronic kidney disease, or unspecified chronic kidney disease: Secondary | ICD-10-CM | POA: Diagnosis present

## 2017-10-18 DIAGNOSIS — N401 Enlarged prostate with lower urinary tract symptoms: Secondary | ICD-10-CM | POA: Diagnosis not present

## 2017-10-18 DIAGNOSIS — R338 Other retention of urine: Secondary | ICD-10-CM | POA: Diagnosis not present

## 2017-10-18 DIAGNOSIS — J441 Chronic obstructive pulmonary disease with (acute) exacerbation: Principal | ICD-10-CM | POA: Diagnosis present

## 2017-10-18 DIAGNOSIS — Z79899 Other long term (current) drug therapy: Secondary | ICD-10-CM | POA: Diagnosis not present

## 2017-10-18 DIAGNOSIS — I10 Essential (primary) hypertension: Secondary | ICD-10-CM | POA: Diagnosis not present

## 2017-10-18 DIAGNOSIS — F17210 Nicotine dependence, cigarettes, uncomplicated: Secondary | ICD-10-CM | POA: Diagnosis not present

## 2017-10-18 DIAGNOSIS — R0603 Acute respiratory distress: Secondary | ICD-10-CM

## 2017-10-18 DIAGNOSIS — J9621 Acute and chronic respiratory failure with hypoxia: Secondary | ICD-10-CM | POA: Diagnosis not present

## 2017-10-18 DIAGNOSIS — N182 Chronic kidney disease, stage 2 (mild): Secondary | ICD-10-CM | POA: Diagnosis not present

## 2017-10-18 DIAGNOSIS — N179 Acute kidney failure, unspecified: Secondary | ICD-10-CM | POA: Diagnosis present

## 2017-10-18 DIAGNOSIS — E1159 Type 2 diabetes mellitus with other circulatory complications: Secondary | ICD-10-CM

## 2017-10-18 DIAGNOSIS — R0902 Hypoxemia: Secondary | ICD-10-CM | POA: Diagnosis not present

## 2017-10-18 DIAGNOSIS — R0602 Shortness of breath: Secondary | ICD-10-CM | POA: Diagnosis not present

## 2017-10-18 DIAGNOSIS — R7989 Other specified abnormal findings of blood chemistry: Secondary | ICD-10-CM

## 2017-10-18 DIAGNOSIS — J9611 Chronic respiratory failure with hypoxia: Secondary | ICD-10-CM | POA: Diagnosis not present

## 2017-10-18 HISTORY — DX: Chronic obstructive pulmonary disease with (acute) exacerbation: J44.1

## 2017-10-18 LAB — BASIC METABOLIC PANEL
ANION GAP: 9 (ref 5–15)
BUN: 15 mg/dL (ref 6–20)
CO2: 22 mmol/L (ref 22–32)
Calcium: 9 mg/dL (ref 8.9–10.3)
Chloride: 106 mmol/L (ref 101–111)
Creatinine, Ser: 1.14 mg/dL (ref 0.61–1.24)
GFR calc non Af Amer: >60 mL/min (ref >60)
Glucose, Bld: 109 mg/dL — ABNORMAL HIGH (ref 65–99)
POTASSIUM: 4.3 mmol/L (ref 3.5–5.1)
Sodium: 137 mmol/L (ref 135–145)

## 2017-10-18 LAB — I-STAT TROPONIN, ED: Troponin i, poc: 0 ng/mL (ref 0.00–0.08)

## 2017-10-18 LAB — CBC
HEMATOCRIT: 42.5 % (ref 39.0–52.0)
HEMOGLOBIN: 14.2 g/dL (ref 13.0–17.0)
MCH: 30.1 pg (ref 26.0–34.0)
MCHC: 33.4 g/dL (ref 30.0–36.0)
MCV: 90 fL (ref 78.0–100.0)
Platelets: 174 10*3/uL (ref 150–400)
RBC: 4.72 MIL/uL (ref 4.22–5.81)
RDW: 14.8 % (ref 11.5–15.5)
WBC: 5.3 10*3/uL (ref 4.0–10.5)

## 2017-10-18 MED ORDER — IPRATROPIUM BROMIDE 0.02 % IN SOLN
1.0000 mg | Freq: Once | RESPIRATORY_TRACT | Status: AC
  Administered 2017-10-18: 1 mg via RESPIRATORY_TRACT

## 2017-10-18 MED ORDER — AZITHROMYCIN 250 MG PO TABS
250.0000 mg | ORAL_TABLET | Freq: Every day | ORAL | Status: DC
  Administered 2017-10-18 – 2017-10-22 (×5): 250 mg via ORAL
  Filled 2017-10-18 (×5): qty 1

## 2017-10-18 MED ORDER — SODIUM CHLORIDE 0.9 % IJ SOLN
INTRAMUSCULAR | Status: AC
Start: 2017-10-18 — End: 2017-10-19
  Filled 2017-10-18: qty 50

## 2017-10-18 MED ORDER — ENOXAPARIN SODIUM 40 MG/0.4ML ~~LOC~~ SOLN
40.0000 mg | SUBCUTANEOUS | Status: DC
  Administered 2017-10-18 – 2017-10-23 (×6): 40 mg via SUBCUTANEOUS
  Filled 2017-10-18 (×6): qty 0.4

## 2017-10-18 MED ORDER — MORPHINE SULFATE (PF) 4 MG/ML IV SOLN
4.0000 mg | Freq: Once | INTRAVENOUS | Status: AC
  Administered 2017-10-18: 4 mg via INTRAVENOUS
  Filled 2017-10-18: qty 1

## 2017-10-18 MED ORDER — SODIUM CHLORIDE 3 % IN NEBU
4.0000 mL | INHALATION_SOLUTION | Freq: Three times a day (TID) | RESPIRATORY_TRACT | Status: AC
  Administered 2017-10-18 – 2017-10-21 (×8): 4 mL via RESPIRATORY_TRACT
  Filled 2017-10-18 (×9): qty 4

## 2017-10-18 MED ORDER — SODIUM CHLORIDE 0.9 % IV SOLN
Freq: Once | INTRAVENOUS | Status: AC
  Administered 2017-10-18: 12:00:00 via INTRAVENOUS

## 2017-10-18 MED ORDER — IOPAMIDOL (ISOVUE-370) INJECTION 76%
INTRAVENOUS | Status: AC
  Filled 2017-10-18: qty 100

## 2017-10-18 MED ORDER — IPRATROPIUM-ALBUTEROL 0.5-2.5 (3) MG/3ML IN SOLN: 3.0000 mL | RESPIRATORY_TRACT | Status: DC | PRN

## 2017-10-18 MED ORDER — SODIUM CHLORIDE 0.9 % IV SOLN: Freq: Once | INTRAVENOUS | Status: DC

## 2017-10-18 MED ORDER — BUTALBITAL-APAP-CAFFEINE 50-325-40 MG PO TABS
2.0000 | ORAL_TABLET | Freq: Once | ORAL | Status: AC
  Administered 2017-10-18: 2 via ORAL
  Filled 2017-10-18: qty 2

## 2017-10-18 MED ORDER — ACETAMINOPHEN 325 MG PO TABS
650.0000 mg | ORAL_TABLET | Freq: Four times a day (QID) | ORAL | Status: DC | PRN
Start: 2017-10-18 — End: 2017-10-24
  Administered 2017-10-19 – 2017-10-21 (×6): 650 mg via ORAL
  Filled 2017-10-18 (×6): qty 2

## 2017-10-18 MED ORDER — GUAIFENESIN ER 600 MG PO TB12
1200.0000 mg | ORAL_TABLET | Freq: Two times a day (BID) | ORAL | Status: DC
  Administered 2017-10-18 – 2017-10-24 (×12): 1200 mg via ORAL
  Filled 2017-10-18 (×13): qty 2

## 2017-10-18 MED ORDER — ALBUTEROL (5 MG/ML) CONTINUOUS INHALATION SOLN
10.0000 mg/h | INHALATION_SOLUTION | Freq: Once | RESPIRATORY_TRACT | Status: AC
  Administered 2017-10-18: 10 mg/h via RESPIRATORY_TRACT

## 2017-10-18 MED ORDER — ALBUTEROL SULFATE (2.5 MG/3ML) 0.083% IN NEBU
5.0000 mg | INHALATION_SOLUTION | Freq: Once | RESPIRATORY_TRACT | Status: AC
  Administered 2017-10-18: 5 mg via RESPIRATORY_TRACT
  Filled 2017-10-18: qty 6

## 2017-10-18 MED ORDER — ALBUTEROL (5 MG/ML) CONTINUOUS INHALATION SOLN: 10.0000 mg/h | INHALATION_SOLUTION | Freq: Once | RESPIRATORY_TRACT | Status: DC

## 2017-10-18 MED ORDER — ALBUTEROL (5 MG/ML) CONTINUOUS INHALATION SOLN
10.0000 mg/h | INHALATION_SOLUTION | RESPIRATORY_TRACT | Status: DC
  Administered 2017-10-18: 10 mg/h via RESPIRATORY_TRACT
  Filled 2017-10-18: qty 20

## 2017-10-18 MED ORDER — METHYLPREDNISOLONE SODIUM SUCC 125 MG IJ SOLR
125.0000 mg | Freq: Once | INTRAMUSCULAR | Status: AC
  Administered 2017-10-18: 125 mg via INTRAVENOUS
  Filled 2017-10-18: qty 2

## 2017-10-18 MED ORDER — IPRATROPIUM-ALBUTEROL 0.5-2.5 (3) MG/3ML IN SOLN
3.0000 mL | Freq: Four times a day (QID) | RESPIRATORY_TRACT | Status: DC
  Administered 2017-10-18 – 2017-10-23 (×21): 3 mL via RESPIRATORY_TRACT
  Filled 2017-10-18 (×21): qty 3

## 2017-10-18 MED ORDER — IOPAMIDOL (ISOVUE-370) INJECTION 76%
100.0000 mL | Freq: Once | INTRAVENOUS | Status: AC | PRN
  Administered 2017-10-18: 100 mL via INTRAVENOUS

## 2017-10-18 MED ORDER — MAGNESIUM SULFATE 2 GM/50ML IV SOLN
2.0000 g | Freq: Once | INTRAVENOUS | Status: AC
  Administered 2017-10-18: 2 g via INTRAVENOUS
  Filled 2017-10-18: qty 50

## 2017-10-18 MED ORDER — METHYLPREDNISOLONE SODIUM SUCC 125 MG IJ SOLR
60.0000 mg | Freq: Three times a day (TID) | INTRAMUSCULAR | Status: DC
  Administered 2017-10-18 – 2017-10-23 (×14): 60 mg via INTRAVENOUS
  Filled 2017-10-18 (×14): qty 2

## 2017-10-18 MED ORDER — ONDANSETRON HCL 4 MG/2ML IJ SOLN
4.0000 mg | Freq: Once | INTRAMUSCULAR | Status: AC
  Administered 2017-10-18: 4 mg via INTRAVENOUS
  Filled 2017-10-18: qty 2

## 2017-10-18 NOTE — ED Triage Notes (Signed)
Pt comes in very SOB. Wife states he came in off porch into house and was SOB then again from car into hospital.

## 2017-10-18 NOTE — ED Notes (Signed)
Patient assisted by myself and Martin,NT to a standing position so he could use restroom. Patient seemed very short of breath.

## 2017-10-18 NOTE — H&P (Signed)
History and Physical  Gregory Christensen DPO:[id number] DOB: [birth year] DOA: 10/18/2017  Referring physician: Dr Ellender Hose PCP: Leighton Ruff, MD  Outpatient Specialists:  Patient coming from: Home Chief Complaint: Sudden shortness of breath early this morning  HPI: Gregory Christensen is a 67 y.o. male with medical history significant for chronic tobacco use, COPD, who presented to [facility] ED with complaints of sudden onset dyspnea since 3 AM this morning.  Upon presentation to the ED a CTA PE was done which was negative.  Patient was given DuoNeb treatments with mild improvement of his symptoms.  Significant wheezes on auscultation.  Chest x-ray done in the ED and and personally reviewed revealed no sign of lobular infiltrates.  Admits to persistent nonproductive cough and dyspnea at rest.  Denies any chest pain.  Admitted for acute COPD exacerbation.  ED course: Accelerated hypertension.  EKG personally reviewed revealed sinus rhythm with rate of 83.  Afebrile with no leukocytosis.  Negative troponin x1.  O2 saturation post breathing treatment 98% on 2 L.  No use of O2 supplementation at home.  Review of Systems: Review of system as noted in the HPI. All other systems reviewed and are negative.  Past Medical History:  Diagnosis Date  . Arthritis   . Chronic back pain   . Chronic hip pain   . COPD (chronic obstructive pulmonary disease) ([place])    History reviewed. No pertinent surgical history.  Social History:  reports that he has been smoking cigarettes.  He has never used smokeless tobacco. He reports that he drinks alcohol. He reports that he does not use drugs.   No Known Allergies  No family history on file.    Prior to Admission medications   Medication Sig Start Date End Date Taking? Authorizing Provider  acetaminophen (TYLENOL) 500 MG tablet Take 1,000 mg by mouth every 6 (six) hours as needed for moderate pain or headache.   Yes [provider]  albuterol (PROVENTIL  HFA;VENTOLIN HFA) 108 (90 BASE) MCG/ACT inhaler Inhale 2 puffs into the lungs every 6 (six) hours as needed for wheezing or shortness of breath.   Yes [provider]  amLODipine (NORVASC) 2.5 MG tablet Take 2.5 mg by mouth daily.   Yes [provider]  budesonide-formoterol (SYMBICORT) 160-4.5 MCG/ACT inhaler Inhale 1 puff into the lungs 2 (two) times daily.   Yes [provider]  cetirizine (ZYRTEC) 10 MG tablet Take 10 mg by mouth daily as needed for allergies.   Yes [provider]  diclofenac (VOLTAREN) 75 MG EC tablet Take 75 mg by mouth 2 (two) times daily.   Yes [provider]  fluticasone (FLONASE) 50 MCG/ACT nasal spray Place 1 spray into both nostrils daily as needed for allergies or rhinitis.   Yes [provider]  gabapentin (NEURONTIN) 300 MG capsule Take 300 mg by mouth 3 (three) times daily.   Yes [provider]  ibuprofen (ADVIL,MOTRIN) 200 MG tablet Take 800 mg by mouth every 6 (six) hours as needed for moderate pain.   Yes [provider]  meloxicam (MOBIC) 7.5 MG tablet Take 1 tablet (7.5 mg total) by mouth daily. 05/15/15  Yes Palumbo, April, MD  methocarbamol (ROBAXIN) 500 MG tablet Take 500 mg by mouth at bedtime as needed for muscle spasms.   Yes [provider]  tamsulosin (FLOMAX) 0.4 MG CAPS capsule Take 0.4 mg by mouth daily after supper.   Yes [provider]  albuterol-ipratropium (COMBIVENT) 18-103 MCG/ACT inhaler Inhale 2 puffs into  the lungs every 4 (four) hours as needed for wheezing or shortness of breath. Patient not taking: Reported on 10/18/2017 05/15/15   Palumbo, April, MD  azithromycin (ZITHROMAX) 250 MG tablet Take 1 tablet (250 mg total) by mouth daily. Take first 2 tablets together, then 1 every day until finished. Patient not taking: Reported on 05/18/2015 04/22/14   Varney Biles, MD  doxycycline (VIBRAMYCIN) 100 MG capsule Take 1 capsule (100 mg total) by mouth 2  (two) times daily. One po bid x 7 days Patient not taking: Reported on 10/18/2017 05/15/15   Palumbo, April, MD  HYDROcodone-acetaminophen (NORCO/VICODIN) 5-325 MG tablet Take 1-2 tablets by mouth every 6 (six) hours as needed. Patient not taking: Reported on 10/18/2017 05/18/15   Montine Circle, PA-C  pantoprazole (PROTONIX) 20 MG tablet Take 2 tablets (40 mg total) by mouth daily. 11/29/16 12/29/16  Maryan Puls, MD    Physical Exam: BP (!) 170/89 (BP Location: Left Arm)   Pulse 95   Temp (!) 97.4 F (36.3 C) (Oral)   Resp 16   Ht 6\' 1"  (1.854 m)   Wt 69.9 kg (154 lb)   SpO2 99%   BMI 20.32 kg/m   General: 67 year old African-American male well-developed well-nourished.  Mildly uncomfortable due to dyspnea.  Alert and oriented x3 Eyes: Anicteric sclera ENT: Mucous membranes dry with no erythema or exudates Neck: No JVD or thyromegaly Cardiovascular: Regular rate and rhythm with no rubs or gallops. Respiratory: Diffuse wheezes bilaterally Abdomen: Soft nontender nondistended normal bowel sounds x4 Skin: No ulcerative lesions Musculoskeletal: No focal motor deficits.  No lower extremity edema. Psychiatric: Mood is appropriate for condition and setting. Neurologic: Alert and oriented x3.  No focal motor deficit.          Labs on Admission:  Basic Metabolic Panel: Recent Labs  Lab 10/18/17 1015  NA 137  K 4.3  CL 106  CO2 22  GLUCOSE 109*  BUN 15  CREATININE 1.14  CALCIUM 9.0   Liver Function Tests: No results for input(s): AST, ALT, ALKPHOS, BILITOT, PROT, ALBUMIN in the last 168 hours. No results for input(s): LIPASE, AMYLASE in the last 168 hours. No results for input(s): AMMONIA in the last 168 hours. CBC: Recent Labs  Lab 10/18/17 1015  WBC 5.3  HGB 14.2  HCT 42.5  MCV 90.0  PLT 174   Cardiac Enzymes: No results for input(s): CKTOTAL, CKMB, CKMBINDEX, TROPONINI in the last 168 hours.  BNP (last 3 results) No results for input(s): BNP in the last  8760 hours.  ProBNP (last 3 results) No results for input(s): PROBNP in the last 8760 hours.  CBG: No results for input(s): GLUCAP in the last 168 hours.  Radiological Exams on Admission: Dg Chest 2 View  Result Date: 10/18/2017 CLINICAL DATA:  Shortness of breath and chest pain EXAM: CHEST - 2 VIEW COMPARISON:  Nov 29, 2016. FINDINGS: There is no edema or consolidation. The heart size and pulmonary vascularity are normal. No adenopathy. No bone lesions. No pneumothorax. IMPRESSION: No edema or consolidation. Electronically Signed   By: Lowella Grip III M.D.   On: 10/18/2017 09:57   Ct Angio Chest Pe W And/or Wo Contrast  Result Date: 10/18/2017 CLINICAL DATA:  Short of breath. EXAM: CT ANGIOGRAPHY CHEST WITH CONTRAST TECHNIQUE: Multidetector CT imaging of the chest was performed using the standard protocol during bolus administration of intravenous contrast. Multiplanar CT image reconstructions and MIPs were obtained to evaluate the vascular anatomy. CONTRAST:  129mL ISOVUE-370 IOPAMIDOL (ISOVUE-370)  INJECTION 76% COMPARISON:  Chest radiograph earlier today. FINDINGS: Considerable respiratory motion. Cardiovascular: Satisfactory opacification of the pulmonary arteries to the segmental level. No evidence of pulmonary embolism. Normal heart size. No pericardial effusion. Aortic atherosclerosis. Coronary artery calcification. Mediastinum/Nodes: No enlarged mediastinal, hilar, or axillary lymph nodes. Thyroid gland, trachea, and esophagus demonstrate no significant findings. Lungs/Pleura: Lungs are clear. No pleural effusion or pneumothorax. Emphysema. Upper Abdomen: No acute abnormality. Musculoskeletal: No chest wall abnormality. No acute or significant osseous findings. Review of the MIP images confirms the above findings. IMPRESSION: Considerable respiratory motion. No evidence of segmental pulmonary embolus. No pneumonia or pneumothorax. Aortic Atherosclerosis (ICD10-I70.0) and Emphysema  (ICD10-J43.9). Electronically Signed   By: Staci Righter M.D.   On: 10/18/2017 14:26    EKG: Independently reviewed.  Personally reviewed and reveals sinus rhythm with rate of 83.  Assessment/Plan Present on Admission: . COPD exacerbation (HCC)  Active Problems:   COPD exacerbation (HCC)  Acute COPD exacerbation secondary to persistent tobacco use vs others Afebrile no leukocytosis Continue DuoNeb every 6 hours and every 2 hours as needed Continue IV steroids Solu-Medrol 60 mg 3 times daily PO azithromycin times 5 days Pulmonary toilet with hypersaline nebs 3 times daily and chest PT 3 times daily  Acute hypoxic respiratory failure secondary to acute COPD exacerbation O2 supplementation to maintain O2 saturation 92% or greater Management as stated above  Tobacco use disorder Tobacco cessation counseling Nicotine patch  Accelerated hypertension Continue home medications IV hydralazine 10 mg every 6 hours as needed for systolic blood pressure >335 or dBP>105   DVT prophylaxis: Lovenox subcu daily  Code Status: Full code  Family Communication: None at bedside  Disposition Plan: Admit to [place] called: None  Admission status: Inpatient    Kayleen Memos MD [facility] Pager [phone]  If 7PM-7AM, please contact night-coverage [email or web address] Password [facility]  10/18/2017, 5:12 PM

## 2017-10-18 NOTE — ED Provider Notes (Signed)
[facility] DEPT Provider Note   CSN: [id number] Arrival date & time: 10/18/17  6734     History   Chief Complaint Chief Complaint  Patient presents with  . Shortness of Breath    HPI Gregory Christensen is a 67 y.o. male.  HPI 67 year old male with past medical history of COPD and chronic pain here with shortness of breath but the patient states he woke up around 3:00 this morning with severe shortness of breath and wheezing.  He believes this was due to the weather changes.  He states that his symptoms started fairly acutely.  He woke up from sleep when outside when the symptoms began.  He does continue to smoke and was smoking last night.  He denies any fevers or sputum production.  He is tried inhaler without significant relief.  Denies any associated symptoms.  He has some mild, right-sided chest pain after coughing, but this is not atypical for him.  Has not recently been on steroids.  No alleviating factors other than brief relief after the inhaler  Past Medical History:  Diagnosis Date  . Arthritis   . Chronic back pain   . Chronic hip pain   . COPD (chronic obstructive pulmonary disease) (HCC)     There are no active problems to display for this patient.   History reviewed. No pertinent surgical history.      Home Medications    Prior to Admission medications   Medication Sig Start Date End Date Taking? Authorizing Provider  acetaminophen (TYLENOL) 500 MG tablet Take 1,000 mg by mouth every 6 (six) hours as needed for moderate pain or headache.   Yes [provider]  albuterol (PROVENTIL HFA;VENTOLIN HFA) 108 (90 BASE) MCG/ACT inhaler Inhale 2 puffs into the lungs every 6 (six) hours as needed for wheezing or shortness of breath.   Yes [provider]  amLODipine (NORVASC) 2.5 MG tablet Take 2.5 mg by mouth daily.   Yes [provider]  budesonide-formoterol (SYMBICORT) 160-4.5 MCG/ACT inhaler Inhale 1 puff into  the lungs 2 (two) times daily.   Yes [provider]  cetirizine (ZYRTEC) 10 MG tablet Take 10 mg by mouth daily as needed for allergies.   Yes [provider]  diclofenac (VOLTAREN) 75 MG EC tablet Take 75 mg by mouth 2 (two) times daily.   Yes [provider]  fluticasone (FLONASE) 50 MCG/ACT nasal spray Place 1 spray into both nostrils daily as needed for allergies or rhinitis.   Yes [provider]  gabapentin (NEURONTIN) 300 MG capsule Take 300 mg by mouth 3 (three) times daily.   Yes [provider]  ibuprofen (ADVIL,MOTRIN) 200 MG tablet Take 800 mg by mouth every 6 (six) hours as needed for moderate pain.   Yes [provider]  meloxicam (MOBIC) 7.5 MG tablet Take 1 tablet (7.5 mg total) by mouth daily. 05/15/15  Yes Palumbo, April, MD  methocarbamol (ROBAXIN) 500 MG tablet Take 500 mg by mouth at bedtime as needed for muscle spasms.   Yes [provider]  tamsulosin (FLOMAX) 0.4 MG CAPS capsule Take 0.4 mg by mouth daily after supper.   Yes [provider]  albuterol-ipratropium (COMBIVENT) 18-103 MCG/ACT inhaler Inhale 2 puffs into the lungs every 4 (four) hours as needed for wheezing or shortness of breath. Patient not taking: Reported on 10/18/2017 05/15/15   Palumbo, April, MD  azithromycin (ZITHROMAX) 250 MG tablet Take 1 tablet (250 mg total) by mouth daily. Take first  2 tablets together, then 1 every day until finished. Patient not taking: Reported on 05/18/2015 04/22/14   Varney Biles, MD  doxycycline (VIBRAMYCIN) 100 MG capsule Take 1 capsule (100 mg total) by mouth 2 (two) times daily. One po bid x 7 days Patient not taking: Reported on 10/18/2017 05/15/15   Palumbo, April, MD  HYDROcodone-acetaminophen (NORCO/VICODIN) 5-325 MG tablet Take 1-2 tablets by mouth every 6 (six) hours as needed. Patient not taking: Reported on 10/18/2017 05/18/15   Montine Circle, PA-C  pantoprazole (PROTONIX) 20 MG tablet Take 2  tablets (40 mg total) by mouth daily. 11/29/16 12/29/16  Maryan Puls, MD    Family History No family history on file.  Social History Social History   Tobacco Use  . Smoking status: Current Every Day Smoker    Types: Cigarettes  . Smokeless tobacco: Never Used  Substance Use Topics  . Alcohol use: Yes  . Drug use: No     Allergies   Patient has no known allergies.   Review of Systems Review of Systems  Constitutional: Positive for fatigue.  Respiratory: Positive for cough, shortness of breath and wheezing.   All other systems reviewed and are negative.    Physical Exam Updated Vital Signs BP (!) 158/88   Pulse 99   Resp 19   Ht 6\' 1"  (1.854 m)   Wt 69.9 kg (154 lb)   SpO2 98%   BMI 20.32 kg/m   Physical Exam  Constitutional: He is oriented to person, place, and time. He appears well-developed and well-nourished. He appears distressed.  HENT:  Head: Normocephalic and atraumatic.  Eyes: Conjunctivae are normal.  Neck: Neck supple.  Cardiovascular: Normal rate, regular rhythm and normal heart sounds. Exam reveals no friction rub.  No murmur heard. Pulmonary/Chest: Tachypnea noted. He is in respiratory distress. He has decreased breath sounds. He has wheezes. He has no rales.  Abdominal: He exhibits no distension.  Musculoskeletal: He exhibits no edema.  Neurological: He is alert and oriented to person, place, and time. He exhibits normal muscle tone.  Skin: Skin is warm. Capillary refill takes less than 2 seconds.  Psychiatric: He has a normal mood and affect.  Nursing note and vitals reviewed.    ED Treatments / Results  Labs (all labs ordered are listed, but only abnormal results are displayed) Labs Reviewed  BASIC METABOLIC PANEL - Abnormal; Notable for the following components:      Result Value   Glucose, Bld 109 (*)    All other components within normal limits  CBC  I-STAT TROPONIN, ED    EKG EKG Interpretation  Date/Time:  Friday October 18 2017 09:07:48 EDT Ventricular Rate:  93 PR Interval:    QRS Duration: 99 QT Interval:  372 QTC Calculation: 463 R Axis:   59 Text Interpretation:  Sinus rhythm Consider right atrial enlargement No significant change since last tracing Confirmed by Duffy Bruce [phone]) on 10/18/2017 9:21:34 AM Also confirmed by Duffy Bruce [phone]), editor Lynder Parents [phone])  on 10/18/2017 1:33:09 PM   Radiology Dg Chest 2 View  Result Date: 10/18/2017 CLINICAL DATA:  Shortness of breath and chest pain EXAM: CHEST - 2 VIEW COMPARISON:  Nov 29, 2016. FINDINGS: There is no edema or consolidation. The heart size and pulmonary vascularity are normal. No adenopathy. No bone lesions. No pneumothorax. IMPRESSION: No edema or consolidation. Electronically Signed   By: Lowella Grip III M.D.   On: 10/18/2017 09:57   Ct Angio Chest Pe W And/or Wo  Contrast  Result Date: 10/18/2017 CLINICAL DATA:  Short of breath. EXAM: CT ANGIOGRAPHY CHEST WITH CONTRAST TECHNIQUE: Multidetector CT imaging of the chest was performed using the standard protocol during bolus administration of intravenous contrast. Multiplanar CT image reconstructions and MIPs were obtained to evaluate the vascular anatomy. CONTRAST:  154mL ISOVUE-370 IOPAMIDOL (ISOVUE-370) INJECTION 76% COMPARISON:  Chest radiograph earlier today. FINDINGS: Considerable respiratory motion. Cardiovascular: Satisfactory opacification of the pulmonary arteries to the segmental level. No evidence of pulmonary embolism. Normal heart size. No pericardial effusion. Aortic atherosclerosis. Coronary artery calcification. Mediastinum/Nodes: No enlarged mediastinal, hilar, or axillary lymph nodes. Thyroid gland, trachea, and esophagus demonstrate no significant findings. Lungs/Pleura: Lungs are clear. No pleural effusion or pneumothorax. Emphysema. Upper Abdomen: No acute abnormality. Musculoskeletal: No chest wall abnormality. No acute or significant osseous findings.  Review of the MIP images confirms the above findings. IMPRESSION: Considerable respiratory motion. No evidence of segmental pulmonary embolus. No pneumonia or pneumothorax. Aortic Atherosclerosis (ICD10-I70.0) and Emphysema (ICD10-J43.9). Electronically Signed   By: Staci Righter M.D.   On: 10/18/2017 14:26    Procedures .Critical Care Performed by: Duffy Bruce, MD Authorized by: Duffy Bruce, MD   Critical care provider statement:    Critical care time (minutes):  35   Critical care time was exclusive of:  Separately billable procedures and treating other patients and teaching time   Critical care was necessary to treat or prevent imminent or life-threatening deterioration of the following conditions:  Respiratory failure and circulatory failure   Critical care was time spent personally by me on the following activities:  Development of treatment plan with patient or surrogate, discussions with consultants, evaluation of patient's response to treatment, examination of patient, obtaining history from patient or surrogate, ordering and performing treatments and interventions, ordering and review of laboratory studies, ordering and review of radiographic studies, pulse oximetry, re-evaluation of patient's condition and review of old charts   I assumed direction of critical care for this patient from another provider in my specialty: no     (including critical care time)  Medications Ordered in ED Medications  albuterol (PROVENTIL,VENTOLIN) solution continuous neb (10 mg/hr Nebulization New Bag/Given 10/18/17 1207)  sodium chloride 0.9 % injection (has no administration in time range)  iopamidol (ISOVUE-370) 76 % injection (has no administration in time range)  albuterol (PROVENTIL) (2.5 MG/3ML) 0.083% nebulizer solution 5 mg (5 mg Nebulization Given 10/18/17 0902)  methylPREDNISolone sodium succinate (SOLU-MEDROL) 125 mg/2 mL injection 125 mg (125 mg Intravenous Given 10/18/17 1014)    magnesium sulfate IVPB 2 g 50 mL (0 g Intravenous Stopped 10/18/17 1158)  albuterol (PROVENTIL,VENTOLIN) solution continuous neb (10 mg/hr Nebulization Given by Other 10/18/17 1014)  ipratropium (ATROVENT) nebulizer solution 1 mg (1 mg Nebulization Given by Other 10/18/17 1014)  morphine 4 MG/ML injection 4 mg (4 mg Intravenous Given 10/18/17 1225)  ondansetron (ZOFRAN) injection 4 mg (4 mg Intravenous Given 10/18/17 1225)  0.9 %  sodium chloride infusion ( Intravenous New Bag/Given 10/18/17 1225)  iopamidol (ISOVUE-370) 76 % injection 100 mL (100 mLs Intravenous Contrast Given 10/18/17 1402)     Initial Impression / Assessment and Plan / ED Course  I have reviewed the triage vital signs and the nursing notes.  Pertinent labs & imaging results that were available during my care of the patient were reviewed by me and considered in my medical decision making (see chart for details).  Clinical Course as of Oct 18 1524  Fri Oct 18, 6353  3959 67 year old male here with  severe shortness of breath.  On exam, he is markedly tachypneic with diffuse wheezing and increased work of breathing.  Will place on a continuous nebulizer with steroids.  No sputum production, fever, or signs of pneumonia.  Chest x-ray reviewed by myself and shows no acute normality.  His EKG is nonischemic.  No signs of alternative etiologies such as PE or pneumonia.   [CI]  1256 Patient with persistent profound hypoxia as well as increased work of breathing after 2 continuous nebs.  While he seems to be mentating well with no signs of CO2 retention clinically, concern for possible PE given the degree of his hypoxia.  Will check CT angios.  Otherwise, will plan to need admission.  His work of breathing does seem to be improved after the recent treatment, so will continue to monitor.  Is been given steroids and magnesium.   [CI]    Clinical Course User Index [CI] Duffy Bruce, MD    CT angio neg. Suspect COPD exacerbation. Admit  to medicine. Improved after nebs x 2.   Final Clinical Impressions(s) / ED Diagnoses   Final diagnoses:  COPD exacerbation [facility])  Respiratory distress  Hypoxia    ED Discharge Orders    None       Duffy Bruce, MD 10/18/17 1526

## 2017-10-19 ENCOUNTER — Other Ambulatory Visit: Payer: Self-pay

## 2017-10-19 DIAGNOSIS — J9621 Acute and chronic respiratory failure with hypoxia: Secondary | ICD-10-CM

## 2017-10-19 DIAGNOSIS — F17210 Nicotine dependence, cigarettes, uncomplicated: Secondary | ICD-10-CM

## 2017-10-19 LAB — CBC
HCT: 41.1 % (ref 39.0–52.0)
Hemoglobin: 13.7 g/dL (ref 13.0–17.0)
MCH: 30.2 pg (ref 26.0–34.0)
MCHC: 33.3 g/dL (ref 30.0–36.0)
MCV: 90.5 fL (ref 78.0–100.0)
Platelets: 173 10*3/uL (ref 150–400)
RBC: 4.54 MIL/uL (ref 4.22–5.81)
RDW: 14.9 % (ref 11.5–15.5)
WBC: 7.2 10*3/uL (ref 4.0–10.5)

## 2017-10-19 LAB — COMPREHENSIVE METABOLIC PANEL
ALBUMIN: 4 g/dL (ref 3.5–5.0)
ALT: 17 U/L (ref 17–63)
ANION GAP: 10 (ref 5–15)
AST: 26 U/L (ref 15–41)
Alkaline Phosphatase: 59 U/L (ref 38–126)
BUN: 14 mg/dL (ref 6–20)
CO2: 23 mmol/L (ref 22–32)
Calcium: 9.3 mg/dL (ref 8.9–10.3)
Chloride: 105 mmol/L (ref 101–111)
Creatinine, Ser: 1.14 mg/dL (ref 0.61–1.24)
GLUCOSE: 147 mg/dL — AB (ref 65–99)
POTASSIUM: 4.6 mmol/L (ref 3.5–5.1)
Sodium: 138 mmol/L (ref 135–145)
TOTAL PROTEIN: 8.3 g/dL — AB (ref 6.5–8.1)
Total Bilirubin: 0.6 mg/dL (ref 0.3–1.2)

## 2017-10-19 LAB — RESPIRATORY PANEL BY PCR
ADENOVIRUS-RVPPCR: NOT DETECTED
Bordetella pertussis: NOT DETECTED
CORONAVIRUS HKU1-RVPPCR: NOT DETECTED
CORONAVIRUS OC43-RVPPCR: NOT DETECTED
Chlamydophila pneumoniae: NOT DETECTED
Coronavirus 229E: NOT DETECTED
Coronavirus NL63: NOT DETECTED
Influenza A: NOT DETECTED
Influenza B: NOT DETECTED
METAPNEUMOVIRUS-RVPPCR: NOT DETECTED
Mycoplasma pneumoniae: NOT DETECTED
PARAINFLUENZA VIRUS 1-RVPPCR: NOT DETECTED
PARAINFLUENZA VIRUS 2-RVPPCR: NOT DETECTED
PARAINFLUENZA VIRUS 3-RVPPCR: NOT DETECTED
Parainfluenza Virus 4: NOT DETECTED
RHINOVIRUS / ENTEROVIRUS - RVPPCR: NOT DETECTED
Respiratory Syncytial Virus: NOT DETECTED

## 2017-10-19 LAB — EXPECTORATED SPUTUM ASSESSMENT W REFEX TO RESP CULTURE

## 2017-10-19 LAB — EXPECTORATED SPUTUM ASSESSMENT W GRAM STAIN, RFLX TO RESP C

## 2017-10-19 LAB — MRSA PCR SCREENING: MRSA by PCR: NEGATIVE

## 2017-10-19 MED ORDER — MONTELUKAST SODIUM 10 MG PO TABS
10.0000 mg | ORAL_TABLET | Freq: Every day | ORAL | Status: DC
  Administered 2017-10-19 – 2017-10-23 (×5): 10 mg via ORAL
  Filled 2017-10-19 (×5): qty 1

## 2017-10-19 MED ORDER — FLUTICASONE PROPIONATE 50 MCG/ACT NA SUSP
1.0000 | Freq: Every day | NASAL | Status: DC | PRN
Start: 2017-10-19 — End: 2017-10-24
  Filled 2017-10-19: qty 16

## 2017-10-19 MED ORDER — TAMSULOSIN HCL 0.4 MG PO CAPS
0.4000 mg | ORAL_CAPSULE | Freq: Every day | ORAL | Status: DC
  Administered 2017-10-19 – 2017-10-21 (×3): 0.4 mg via ORAL
  Filled 2017-10-19 (×3): qty 1

## 2017-10-19 MED ORDER — GABAPENTIN 300 MG PO CAPS
300.0000 mg | ORAL_CAPSULE | Freq: Three times a day (TID) | ORAL | Status: DC
  Administered 2017-10-19 – 2017-10-24 (×16): 300 mg via ORAL
  Filled 2017-10-19 (×16): qty 1

## 2017-10-19 MED ORDER — AMLODIPINE BESYLATE 5 MG PO TABS
2.5000 mg | ORAL_TABLET | Freq: Every day | ORAL | Status: DC
  Administered 2017-10-19 – 2017-10-21 (×3): 2.5 mg via ORAL
  Filled 2017-10-19 (×3): qty 1

## 2017-10-19 MED ORDER — LIP MEDEX EX OINT
TOPICAL_OINTMENT | CUTANEOUS | Status: DC | PRN
  Administered 2017-10-19: 19:00:00 via TOPICAL
  Filled 2017-10-19: qty 7

## 2017-10-19 NOTE — Progress Notes (Addendum)
PROGRESS NOTE  Gregory Christensen VQQ:[id number] DOB: 09-01-1950 DOA: 10/18/2017 PCP: Leighton Ruff, MD  HPI/Recap of past 24 hours:  He continue to have diffuse wheezing, dry cough   Assessment/Plan: Active Problems:   COPD exacerbation (HCC)   Hypoxia   Respiratory distress   Essential hypertension  Acute hypoxic respiratory failure due to COPD exacerbation -Chest x-ray no acute findings, no fever, no leukocytosis, troponin negative -MRSA screen negative, respiratory viral panel negative, sputum culture in process -Remains significant bilateral wheezing, continued Zithromax, steroid, nebulizer, Mucinex -Wean oxygen as tolerated -Report brother and pollen cause copd Exacerbation, start singulair. -He is advised to have pulmonology follow-up on outpatient basis.    Smoking cessation education provided greater than 3 minutes He reported he has cut down from 4 packs a day to 1 pack a day  HTN: continue home med norvasc  BPH continue flomax  Code Status: full  Family Communication: patient   Disposition Plan: home in 1-2 days pending cr and improvement in wheezing   Consultants:  none  Procedures:  none  Antibiotics:  As above   Objective: BP (!) 144/89 (BP Location: Left Arm)   Pulse 74   Temp 98 F (36.7 C) (Oral)   Resp 18   Ht 6\' 1"  (1.854 m)   Wt 69.9 kg (154 lb)   SpO2 97%   BMI 20.32 kg/m   Intake/Output Summary (Last 24 hours) at 10/19/2017 1043 Last data filed at 10/19/2017 0854 Gross per 24 hour  Intake 930 ml  Output 1375 ml  Net -445 ml   Filed Weights   10/18/17 0856  Weight: 69.9 kg (154 lb)    Exam: Patient is examined daily including today on 10/19/2017, exams remain the same as of yesterday except that has changed    General:  NAD  Cardiovascular: RRR  Respiratory: diffuse wheezing bilaterally  Abdomen: Soft/ND/NT, positive BS  Musculoskeletal: No Edema  Neuro: alert, oriented   Data Reviewed: Basic Metabolic  Panel: Recent Labs  Lab 10/18/17 1015 10/19/17 0624  NA 137 138  K 4.3 4.6  CL 106 105  CO2 22 23  GLUCOSE 109* 147*  BUN 15 14  CREATININE 1.14 1.14  CALCIUM 9.0 9.3   Liver Function Tests: Recent Labs  Lab 10/19/17 0624  AST 26  ALT 17  ALKPHOS 59  BILITOT 0.6  PROT 8.3*  ALBUMIN 4.0   No results for input(s): LIPASE, AMYLASE in the last 168 hours. No results for input(s): AMMONIA in the last 168 hours. CBC: Recent Labs  Lab 10/18/17 1015 10/19/17 0624  WBC 5.3 7.2  HGB 14.2 13.7  HCT 42.5 41.1  MCV 90.0 90.5  PLT 174 173   Cardiac Enzymes:   No results for input(s): CKTOTAL, CKMB, CKMBINDEX, TROPONINI in the last 168 hours. BNP (last 3 results) No results for input(s): BNP in the last 8760 hours.  ProBNP (last 3 results) No results for input(s): PROBNP in the last 8760 hours.  CBG: No results for input(s): GLUCAP in the last 168 hours.  No results found for this or any previous visit (from the past 240 hour(s)).   Studies: Ct Angio Chest Pe W And/or Wo Contrast  Result Date: 10/18/2017 CLINICAL DATA:  Short of breath. EXAM: CT ANGIOGRAPHY CHEST WITH CONTRAST TECHNIQUE: Multidetector CT imaging of the chest was performed using the standard protocol during bolus administration of intravenous contrast. Multiplanar CT image reconstructions and MIPs were obtained to evaluate the vascular anatomy. CONTRAST:  161mL ISOVUE-370 IOPAMIDOL (  ISOVUE-370) INJECTION 76% COMPARISON:  Chest radiograph earlier today. FINDINGS: Considerable respiratory motion. Cardiovascular: Satisfactory opacification of the pulmonary arteries to the segmental level. No evidence of pulmonary embolism. Normal heart size. No pericardial effusion. Aortic atherosclerosis. Coronary artery calcification. Mediastinum/Nodes: No enlarged mediastinal, hilar, or axillary lymph nodes. Thyroid gland, trachea, and esophagus demonstrate no significant findings. Lungs/Pleura: Lungs are clear. No pleural  effusion or pneumothorax. Emphysema. Upper Abdomen: No acute abnormality. Musculoskeletal: No chest wall abnormality. No acute or significant osseous findings. Review of the MIP images confirms the above findings. IMPRESSION: Considerable respiratory motion. No evidence of segmental pulmonary embolus. No pneumonia or pneumothorax. Aortic Atherosclerosis (ICD10-I70.0) and Emphysema (ICD10-J43.9). Electronically Signed   By: Staci Righter M.D.   On: 10/18/2017 14:26    Scheduled Meds: . azithromycin  250 mg Oral QHS  . enoxaparin (LOVENOX) injection  40 mg Subcutaneous Q24H  . guaiFENesin  1,200 mg Oral BID  . ipratropium-albuterol  3 mL Nebulization Q6H  . methylPREDNISolone (SOLU-MEDROL) injection  60 mg Intravenous Q8H  . sodium chloride HYPERTONIC  4 mL Nebulization TID    Continuous Infusions: . sodium chloride 75 mL/hr at 10/18/17 2016  . albuterol 10 mg/hr (10/18/17 1207)     Time spent: 30mins I have personally reviewed and interpreted on  10/19/2017 daily labs, tele strips, imagings as discussed above under date review session and assessment and plans.  I reviewed all nursing notes,  vitals, pertinent old records  I have discussed plan of care as described above with RN , patient  on 10/19/2017   Florencia Reasons MD, PhD  [facility] Pager [phone]. If 7PM-7AM, please contact night-coverage at [email or web address], password [facility] 10/19/2017, 10:43 AM  LOS: 1 day

## 2017-10-20 DIAGNOSIS — J9611 Chronic respiratory failure with hypoxia: Secondary | ICD-10-CM

## 2017-10-20 LAB — URINALYSIS, ROUTINE W REFLEX MICROSCOPIC
BILIRUBIN URINE: NEGATIVE
Glucose, UA: NEGATIVE mg/dL
Ketones, ur: NEGATIVE mg/dL
NITRITE: NEGATIVE
Protein, ur: NEGATIVE mg/dL
SPECIFIC GRAVITY, URINE: 1.011 (ref 1.005–1.030)
pH: 6 (ref 5.0–8.0)

## 2017-10-20 LAB — MAGNESIUM: Magnesium: 2.3 mg/dL (ref 1.7–2.4)

## 2017-10-20 LAB — BASIC METABOLIC PANEL
ANION GAP: 9 (ref 5–15)
BUN: 20 mg/dL (ref 6–20)
CO2: 23 mmol/L (ref 22–32)
Calcium: 9.4 mg/dL (ref 8.9–10.3)
Chloride: 102 mmol/L (ref 101–111)
Creatinine, Ser: 1.36 mg/dL — ABNORMAL HIGH (ref 0.61–1.24)
GFR calc Af Amer: >60 mL/min (ref >60)
GFR calc non Af Amer: 53 mL/min — ABNORMAL LOW (ref >60)
Glucose, Bld: 164 mg/dL — ABNORMAL HIGH (ref 65–99)
POTASSIUM: 4.2 mmol/L (ref 3.5–5.1)
SODIUM: 134 mmol/L — AB (ref 135–145)

## 2017-10-20 MED ORDER — SODIUM CHLORIDE 0.9 % IV SOLN
INTRAVENOUS | Status: AC
  Administered 2017-10-20 – 2017-10-21 (×2): via INTRAVENOUS

## 2017-10-20 MED ORDER — SODIUM CHLORIDE 0.9 % IV BOLUS
1000.0000 mL | Freq: Once | INTRAVENOUS | Status: AC
  Administered 2017-10-20: 1000 mL via INTRAVENOUS

## 2017-10-20 NOTE — Progress Notes (Signed)
PROGRESS NOTE  Gregory Christensen [id number]:[id number] DOB: [birth year] DOA: 10/18/2017 PCP: Leighton Ruff, MD  HPI/Recap of past 24 hours:  Remain wheezing, on oxygen, denies chest pain, no edema, no fever Report cough is dry,  Assessment/Plan: Active Problems:   COPD exacerbation (HCC)   Hypoxia   Respiratory distress   Essential hypertension  Acute hypoxic respiratory failure due to COPD exacerbation -Chest x-ray no acute findings, no fever, no leukocytosis, troponin negative -MRSA screen negative, respiratory viral panel in process, sputum culture in process -Remains significant bilateral wheezing, continued Zithromax, steroid, nebulizer, Mucinex -Wean oxygen as tolerated -Report brother and pollen cause copd Exacerbation, start singulair. -He is advised to have pulmonology follow-up on outpatient basis.   AKI on CKDII -will get ua, patient denies urinary symptom -start hydration, repeat labs in am Renal dosing meds  Smoking cessation education provided greater than 3 minutes He reported he has cut down from 4 packs a day to 1 pack a day  HTN: continue home med norvasc  BPH continue flomax  Code Status: full  Family Communication: patient   Disposition Plan: home in 1-2 days   Consultants:  none  Procedures:  none  Antibiotics:  As above   Objective: BP 128/84 (BP Location: Right Arm)   Pulse 91   Temp 98 F (36.7 C) (Oral)   Resp 18   Ht 6\' 1"  (1.854 m)   Wt 69.9 kg (154 lb)   SpO2 94%   BMI 20.32 kg/m   Intake/Output Summary (Last 24 hours) at 10/20/2017 1153 Last data filed at 10/20/2017 1056 Gross per 24 hour  Intake 11228.75 ml  Output 1950 ml  Net 9278.75 ml   Filed Weights   10/18/17 0856  Weight: 69.9 kg (154 lb)    Exam: Patient is examined daily including today on 10/20/2017, exams remain the same as of yesterday except that has changed    General:  NAD  Cardiovascular: RRR  Respiratory: diffuse wheezing  bilaterally  Abdomen: Soft/ND/NT, positive BS  Musculoskeletal: No Edema  Neuro: alert, oriented   Data Reviewed: Basic Metabolic Panel: Recent Labs  Lab 10/18/17 1015 10/19/17 0624 10/20/17 0550  NA 137 138 134*  K 4.3 4.6 4.2  CL 106 105 102  CO2 22 23 23   GLUCOSE 109* 147* 164*  BUN 15 14 20   CREATININE 1.14 1.14 1.36*  CALCIUM 9.0 9.3 9.4  MG  --   --  2.3   Liver Function Tests: Recent Labs  Lab 10/19/17 0624  AST 26  ALT 17  ALKPHOS 59  BILITOT 0.6  PROT 8.3*  ALBUMIN 4.0   No results for input(s): LIPASE, AMYLASE in the last 168 hours. No results for input(s): AMMONIA in the last 168 hours. CBC: Recent Labs  Lab 10/18/17 1015 10/19/17 0624  WBC 5.3 7.2  HGB 14.2 13.7  HCT 42.5 41.1  MCV 90.0 90.5  PLT 174 173   Cardiac Enzymes:   No results for input(s): CKTOTAL, CKMB, CKMBINDEX, TROPONINI in the last 168 hours. BNP (last 3 results) No results for input(s): BNP in the last 8760 hours.  ProBNP (last 3 results) No results for input(s): PROBNP in the last 8760 hours.  CBG: No results for input(s): GLUCAP in the last 168 hours.  Recent Results (from the past 240 hour(s))  Culture, expectorated sputum-assessment     Status: None   Collection Time: 10/19/17 10:09 AM  Result Value Ref Range Status   Specimen Description SPUTUM  Final   Special  Requests NONE  Final   Sputum evaluation   Final    THIS SPECIMEN IS ACCEPTABLE FOR SPUTUM CULTURE Performed at [facility] [address], [place], [place] [address]    Report Status 10/19/2017 FINAL  Final  Culture, respiratory (NON-Expectorated)     Status: None (Preliminary result)   Collection Time: 10/19/17 10:09 AM  Result Value Ref Range Status   Specimen Description   Final    SPUTUM Performed at [facility] [address] [place], [place] [address]    Special Requests   Final    NONE Reflexed from [id number] Performed at [facility] [address], [place], [place] [address]    Gram Stain   Final    RARE WBC PRESENT, PREDOMINANTLY PMN FEW SQUAMOUS EPITHELIAL CELLS PRESENT FEW GRAM POSITIVE COCCI    Culture   Final    CULTURE REINCUBATED FOR BETTER GROWTH Performed at [facility], [place] [address] [place], [place] [address]    Report Status PENDING  Incomplete  Respiratory Panel by PCR     Status: None   Collection Time: 10/19/17 11:07 AM  Result Value Ref Range Status   Adenovirus NOT DETECTED NOT DETECTED Final   Coronavirus 229E NOT DETECTED NOT DETECTED Final   Coronavirus HKU1 NOT DETECTED NOT DETECTED Final   Coronavirus NL63 NOT DETECTED NOT DETECTED Final   Coronavirus OC43 NOT DETECTED NOT DETECTED Final   Metapneumovirus NOT DETECTED NOT DETECTED Final   Rhinovirus / Enterovirus NOT DETECTED NOT DETECTED Final   Influenza A NOT DETECTED NOT DETECTED Final   Influenza B NOT DETECTED NOT DETECTED Final   Parainfluenza Virus 1 NOT DETECTED NOT DETECTED Final   Parainfluenza Virus 2 NOT DETECTED NOT DETECTED Final   Parainfluenza Virus 3 NOT DETECTED NOT DETECTED Final   Parainfluenza Virus 4 NOT DETECTED NOT DETECTED Final   Respiratory Syncytial Virus NOT DETECTED NOT DETECTED Final   Bordetella pertussis NOT DETECTED NOT DETECTED Final   Chlamydophila pneumoniae NOT DETECTED NOT DETECTED Final   Mycoplasma pneumoniae NOT DETECTED NOT DETECTED Final    Comment: Performed at [facility], [place] [address] [place], [place] [address]  MRSA PCR Screening     Status: None   Collection Time: 10/19/17 11:07 AM  Result Value Ref Range Status   MRSA by PCR NEGATIVE NEGATIVE Final    Comment:        The GeneXpert MRSA Assay (FDA approved for NASAL specimens only), is one component of a comprehensive MRSA colonization surveillance program. It is not intended to diagnose MRSA infection nor to guide or monitor treatment for MRSA infections. Performed at [facility], [place] [address], [place], [place] [address]      Studies: No results found.  Scheduled Meds: . amLODipine  2.5 mg Oral Daily  . azithromycin  250 mg Oral QHS  . enoxaparin (LOVENOX) injection  40 mg Subcutaneous Q24H  . gabapentin  300 mg Oral TID  . guaiFENesin  1,200 mg Oral BID  . ipratropium-albuterol  3 mL Nebulization Q6H  . methylPREDNISolone (SOLU-MEDROL) injection  60 mg Intravenous Q8H  . montelukast  10 mg Oral QHS  . sodium chloride HYPERTONIC  4 mL Nebulization TID  . tamsulosin  0.4 mg Oral QPC supper    Continuous Infusions: . sodium chloride Stopped (10/19/17 1511)  . sodium chloride 75 mL/hr at 10/20/17 1054  . albuterol 10 mg/hr (10/18/17 1207)  Time spent: 64mins I have personally reviewed and interpreted on  10/20/2017 daily labs, tele strips, imagings as discussed above under date review session and assessment and plans.  I reviewed all nursing notes,  vitals, pertinent old records  I have discussed plan of care as described above with RN , patient  on 10/20/2017   Florencia Reasons MD, PhD  [facility] Pager [phone]. If 7PM-7AM, please contact night-coverage at [email or web address], password [facility] 10/20/2017, 11:53 AM  LOS: 2 days

## 2017-10-21 ENCOUNTER — Inpatient Hospital Stay (HOSPITAL_COMMUNITY): Payer: Medicare HMO

## 2017-10-21 LAB — BASIC METABOLIC PANEL
ANION GAP: 11 (ref 5–15)
BUN: 19 mg/dL (ref 6–20)
CHLORIDE: 104 mmol/L (ref 101–111)
CO2: 21 mmol/L — AB (ref 22–32)
CREATININE: 1.64 mg/dL — AB (ref 0.61–1.24)
Calcium: 8.8 mg/dL — ABNORMAL LOW (ref 8.9–10.3)
GFR calc non Af Amer: 42 mL/min — ABNORMAL LOW (ref >60)
GFR, EST AFRICAN AMERICAN: 49 mL/min — AB (ref >60)
Glucose, Bld: 124 mg/dL — ABNORMAL HIGH (ref 65–99)
POTASSIUM: 4.5 mmol/L (ref 3.5–5.1)
Sodium: 136 mmol/L (ref 135–145)

## 2017-10-21 MED ORDER — TAMSULOSIN HCL 0.4 MG PO CAPS
0.8000 mg | ORAL_CAPSULE | Freq: Every day | ORAL | Status: DC
  Administered 2017-10-22 – 2017-10-23 (×2): 0.8 mg via ORAL
  Filled 2017-10-21 (×2): qty 2

## 2017-10-21 MED ORDER — SODIUM CHLORIDE 0.9 % IV SOLN
INTRAVENOUS | Status: AC
  Administered 2017-10-21: 16:00:00 via INTRAVENOUS

## 2017-10-21 MED ORDER — HYDRALAZINE HCL 20 MG/ML IJ SOLN
10.0000 mg | Freq: Four times a day (QID) | INTRAMUSCULAR | Status: DC | PRN
  Administered 2017-10-21: 10 mg via INTRAVENOUS
  Filled 2017-10-21: qty 1

## 2017-10-21 MED ORDER — AMLODIPINE BESYLATE 5 MG PO TABS
5.0000 mg | ORAL_TABLET | Freq: Every day | ORAL | Status: DC
  Administered 2017-10-22 – 2017-10-23 (×2): 5 mg via ORAL
  Filled 2017-10-21 (×2): qty 1

## 2017-10-21 MED ORDER — SODIUM CHLORIDE 0.9 % IV SOLN
1.0000 g | INTRAVENOUS | Status: DC
  Administered 2017-10-21 – 2017-10-22 (×2): 1 g via INTRAVENOUS
  Filled 2017-10-21: qty 1
  Filled 2017-10-21: qty 10
  Filled 2017-10-21: qty 1

## 2017-10-21 NOTE — Progress Notes (Signed)
PROGRESS NOTE  Gregory Christensen HER:[id number] DOB: 08-12-1950 DOA: 10/18/2017 PCP: Leighton Ruff, MD  HPI/Recap of past 24 hours:   there is no significant change in diffuse wheezing and dyspnea His blood pressure is elevated Cr worsening He  denies chest pain, no edema, no fever Report cough is dry,  Assessment/Plan: Active Problems:   COPD exacerbation (HCC)   Hypoxia   Respiratory distress   Essential hypertension  Acute hypoxic respiratory failure due to COPD exacerbation -Chest x-ray no acute findings, no fever, no leukocytosis, troponin negative -CTA on admission on PE -MRSA screen negative, respiratory viral panel in process, sputum culture in process -Remains significant bilateral wheezing, continued Zithromax, steroid, nebulizer, Mucinex -Wean oxygen as tolerated -Report brother and pollen cause copd Exacerbation, start singulair. -not improving, will add rocephin, continue zithromax -He is advised to have pulmonology follow-up on outpatient basis.   AKI on CKDII -cr worsening -ua unremarkable, patient denies urinary symptom -he received cta initially on presentation, contrast nephropathy? -continue hydration, repeat labs in am -Renal dosing meds  Smoking cessation education provided  He reported he has cut down from 4 packs a day to 1 pack a day  HTN: continue home med norvasc bp elevated, increase norvasc, add prn hydralazine  BPH has urinary retention, increase flomax  Code Status: full  Family Communication: patient   Disposition Plan: not ready to discharge,    Consultants:  none  Procedures:  none  Antibiotics:  As above   Objective: BP (!) 178/101   Pulse (!) 56   Temp 97.6 F (36.4 C)   Resp 17   Ht 6\' 1"  (1.854 m)   Wt 69.9 kg (154 lb)   SpO2 99%   BMI 20.32 kg/m   Intake/Output Summary (Last 24 hours) at 10/21/2017 1654 Last data filed at 10/21/2017 1602 Gross per 24 hour  Intake 1601.5 ml  Output 1800 ml  Net  -198.5 ml   Filed Weights   10/18/17 0856  Weight: 69.9 kg (154 lb)    Exam: Patient is examined daily including today on 10/21/2017, exams remain the same as of yesterday except that has changed    General:  NAD  Cardiovascular: RRR  Respiratory: diffuse wheezing bilaterally  Abdomen: Soft/ND/NT, positive BS  Musculoskeletal: No Edema  Neuro: alert, oriented   Data Reviewed: Basic Metabolic Panel: Recent Labs  Lab 10/18/17 1015 10/19/17 0624 10/20/17 0550 10/21/17 0556  NA 137 138 134* 136  K 4.3 4.6 4.2 4.5  CL 106 105 102 104  CO2 22 23 23  21*  GLUCOSE 109* 147* 164* 124*  BUN 15 14 20 19   CREATININE 1.14 1.14 1.36* 1.64*  CALCIUM 9.0 9.3 9.4 8.8*  MG  --   --  2.3  --    Liver Function Tests: Recent Labs  Lab 10/19/17 0624  AST 26  ALT 17  ALKPHOS 59  BILITOT 0.6  PROT 8.3*  ALBUMIN 4.0   No results for input(s): LIPASE, AMYLASE in the last 168 hours. No results for input(s): AMMONIA in the last 168 hours. CBC: Recent Labs  Lab 10/18/17 1015 10/19/17 0624  WBC 5.3 7.2  HGB 14.2 13.7  HCT 42.5 41.1  MCV 90.0 90.5  PLT 174 173   Cardiac Enzymes:   No results for input(s): CKTOTAL, CKMB, CKMBINDEX, TROPONINI in the last 168 hours. BNP (last 3 results) No results for input(s): BNP in the last 8760 hours.  ProBNP (last 3 results) No results for input(s): PROBNP in the last  8760 hours.  CBG: No results for input(s): GLUCAP in the last 168 hours.  Recent Results (from the past 240 hour(s))  Culture, expectorated sputum-assessment     Status: None   Collection Time: 10/19/17 10:09 AM  Result Value Ref Range Status   Specimen Description SPUTUM  Final   Special Requests NONE  Final   Sputum evaluation   Final    THIS SPECIMEN IS ACCEPTABLE FOR SPUTUM CULTURE Performed at [facility], [place] [address], [place], [place] [address]    Report Status 10/19/2017 FINAL  Final  Culture, respiratory (NON-Expectorated)      Status: None (Preliminary result)   Collection Time: 10/19/17 10:09 AM  Result Value Ref Range Status   Specimen Description   Final    SPUTUM Performed at [facility] [address], [place], [place] [address]    Special Requests   Final    NONE Reflexed from [id number] Performed at [facility] [address] [place], [place] [address]    Gram Stain   Final    RARE WBC PRESENT, PREDOMINANTLY PMN FEW SQUAMOUS EPITHELIAL CELLS PRESENT FEW GRAM POSITIVE COCCI    Culture   Final    CULTURE REINCUBATED FOR BETTER GROWTH Performed at [facility], [place] [address], [place], [place] [address]    Report Status PENDING  Incomplete  Respiratory Panel by PCR     Status: None   Collection Time: 10/19/17 11:07 AM  Result Value Ref Range Status   Adenovirus NOT DETECTED NOT DETECTED Final   Coronavirus 229E NOT DETECTED NOT DETECTED Final   Coronavirus HKU1 NOT DETECTED NOT DETECTED Final   Coronavirus NL63 NOT DETECTED NOT DETECTED Final   Coronavirus OC43 NOT DETECTED NOT DETECTED Final   Metapneumovirus NOT DETECTED NOT DETECTED Final   Rhinovirus / Enterovirus NOT DETECTED NOT DETECTED Final   Influenza A NOT DETECTED NOT DETECTED Final   Influenza B NOT DETECTED NOT DETECTED Final   Parainfluenza Virus 1 NOT DETECTED NOT DETECTED Final   Parainfluenza Virus 2 NOT DETECTED NOT DETECTED Final   Parainfluenza Virus 3 NOT DETECTED NOT DETECTED Final   Parainfluenza Virus 4 NOT DETECTED NOT DETECTED Final   Respiratory Syncytial Virus NOT DETECTED NOT DETECTED Final   Bordetella pertussis NOT DETECTED NOT DETECTED Final   Chlamydophila pneumoniae NOT DETECTED NOT DETECTED Final   Mycoplasma pneumoniae NOT DETECTED NOT DETECTED Final    Comment: Performed at [facility], [place] [address], [place], [place] [address]  MRSA PCR Screening     Status: None   Collection Time: 10/19/17 11:07 AM  Result Value Ref Range Status   MRSA by PCR  NEGATIVE NEGATIVE Final    Comment:        The GeneXpert MRSA Assay (FDA approved for NASAL specimens only), is one component of a comprehensive MRSA colonization surveillance program. It is not intended to diagnose MRSA infection nor to guide or monitor treatment for MRSA infections. Performed at [facility], [place] [address], [place],  [address]      Studies: US Renal  Result Date: 10/21/2017 CLINICAL DATA:  Elevated creatinine. EXAM: RENAL / URINARY TRACT ULTRASOUND COMPLETE COMPARISON:  None. FINDINGS: Right Kidney: Length: 10.6 cm. Echogenicity within normal limits. No mass or hydronephrosis visualized. Left Kidney: Length: 10.7 cm. Echogenicity within normal limits. No mass or hydronephrosis visualized. Bladder: Appears normal for degree of bladder distention. The prostate gland is mildly enlarged 4.8 x  3.6 x 4.4 cm. IMPRESSION: Negative for hydronephrosis.  No acute abnormality. Prostatomegaly. Electronically Signed   By: Inge Rise M.D.   On: 10/21/2017 12:39    Scheduled Meds: . [START ON 10/22/2017] amLODipine  5 mg Oral Daily  . azithromycin  250 mg Oral QHS  . enoxaparin (LOVENOX) injection  40 mg Subcutaneous Q24H  . gabapentin  300 mg Oral TID  . guaiFENesin  1,200 mg Oral BID  . ipratropium-albuterol  3 mL Nebulization Q6H  . methylPREDNISolone (SOLU-MEDROL) injection  60 mg Intravenous Q8H  . montelukast  10 mg Oral QHS  . tamsulosin  0.4 mg Oral QPC supper    Continuous Infusions: . sodium chloride Stopped (10/19/17 1511)  . sodium chloride 75 mL/hr at 10/21/17 1602  . albuterol 10 mg/hr (10/18/17 1207)  . cefTRIAXone (ROCEPHIN)  IV 1 g (10/21/17 1602)     Time spent: 26mins I have personally reviewed and interpreted on  10/21/2017 daily labs, tele strips, imagings as discussed above under date review session and assessment and plans.  I reviewed all nursing notes,  vitals, pertinent old records  I have discussed plan of  care as described above with RN , patient  on 10/21/2017   Florencia Reasons MD, PhD  [facility] Pager [phone]. If 7PM-7AM, please contact night-coverage at [email or web address], password [facility] 10/21/2017, 4:54 PM  LOS: 3 days

## 2017-10-22 LAB — CBC WITH DIFFERENTIAL/PLATELET
BASOS ABS: 0 10*3/uL (ref 0.0–0.1)
BASOS PCT: 0 %
EOS ABS: 0 10*3/uL (ref 0.0–0.7)
EOS PCT: 0 %
HCT: 44 % (ref 39.0–52.0)
Hemoglobin: 14.5 g/dL (ref 13.0–17.0)
Lymphocytes Relative: 10 %
Lymphs Abs: 0.8 10*3/uL (ref 0.7–4.0)
MCH: 29.8 pg (ref 26.0–34.0)
MCHC: 33 g/dL (ref 30.0–36.0)
MCV: 90.3 fL (ref 78.0–100.0)
Monocytes Absolute: 0.5 10*3/uL (ref 0.1–1.0)
Monocytes Relative: 7 %
NEUTROS ABS: 6.3 10*3/uL (ref 1.7–7.7)
Neutrophils Relative %: 83 %
PLATELETS: 192 10*3/uL (ref 150–400)
RBC: 4.87 MIL/uL (ref 4.22–5.81)
RDW: 14.5 % (ref 11.5–15.5)
WBC: 7.6 10*3/uL (ref 4.0–10.5)

## 2017-10-22 LAB — PSA: PROSTATIC SPECIFIC ANTIGEN: 2.8 ng/mL (ref 0.00–4.00)

## 2017-10-22 LAB — CULTURE, RESPIRATORY W GRAM STAIN

## 2017-10-22 LAB — CULTURE, RESPIRATORY: CULTURE: NORMAL

## 2017-10-22 LAB — BASIC METABOLIC PANEL
ANION GAP: 11 (ref 5–15)
BUN: 20 mg/dL (ref 6–20)
CALCIUM: 9.4 mg/dL (ref 8.9–10.3)
CO2: 23 mmol/L (ref 22–32)
Chloride: 102 mmol/L (ref 101–111)
Creatinine, Ser: 1.11 mg/dL (ref 0.61–1.24)
Glucose, Bld: 123 mg/dL — ABNORMAL HIGH (ref 65–99)
Potassium: 4 mmol/L (ref 3.5–5.1)
SODIUM: 136 mmol/L (ref 135–145)

## 2017-10-22 MED ORDER — LORATADINE 10 MG PO TABS
10.0000 mg | ORAL_TABLET | Freq: Every day | ORAL | Status: DC
  Administered 2017-10-22 – 2017-10-24 (×3): 10 mg via ORAL
  Filled 2017-10-22 (×3): qty 1

## 2017-10-22 NOTE — Care Management Important Message (Signed)
Important Message  Patient Details  Name: Gregory Christensen MRN: [id number] Date of Birth: 02/13/1951   Medicare Important Message Given:  Yes    Kerin Salen 10/22/2017, 1:06 [place] Message  Patient Details  Name: Gregory Christensen MRN: [id number] Date of Birth: 30-Mar-1951   Medicare Important Message Given:  Yes    Kerin Salen 10/22/2017, 1:06 PM

## 2017-10-22 NOTE — Progress Notes (Signed)
PROGRESS NOTE  Gregory Christensen NTI:[id number] DOB: [birth year] DOA: 10/18/2017 PCP: Leighton Ruff, MD  HPI/Recap of past 24 hours:   there is no significant change in diffuse wheezing and dyspnea, nonproductive cough His blood pressure is elevated Cr improved He  denies chest pain, no edema, no fever   Assessment/Plan: Active Problems:   COPD exacerbation (HCC)   Hypoxia   Respiratory distress   Essential hypertension  Acute hypoxic respiratory failure due to COPD exacerbation -Chest x-ray no acute findings, no fever, no leukocytosis, troponin negative -CTA on admission on PE -MRSA screen negative, respiratory viral panel in process, sputum culture in process -Remains significant bilateral wheezing, continued Zithromax, steroid, nebulizer, Mucinex -Wean oxygen as tolerated -Report brother and pollen cause copd Exacerbation, start singulair. -not improving, started on rocephin on 4/22, continue zithromax -He is advised to have pulmonology follow-up on outpatient basis.   AKI on CKDII --ua unremarkable, patient denies urinary symptom -he received cta initially on presentation, contrast nephropathy? -cr peaked at 1.64, trending down, continue hydration, repeat labs in am -Renal dosing meds  Smoking cessation education provided  He reported he has cut down from 4 packs a day to 1 pack a day  HTN: continue home med norvasc bp elevated, increase norvasc, add prn hydralazine  BPH has urinary retention, increase flomax  Code Status: full  Family Communication: patient   Disposition Plan: not ready to discharge,    Consultants:  none  Procedures:  none  Antibiotics:  As above   Objective: BP (!) 161/94 (BP Location: Left Arm) Comment: nurse notified, recheck.  Pulse 77   Temp 98 F (36.7 C)   Resp (!) 21   Ht 6\' 1"  (1.854 m)   Wt 69.9 kg (154 lb)   SpO2 99%   BMI 20.32 kg/m   Intake/Output Summary (Last 24 hours) at 10/22/2017 1050 Last data filed  at 10/22/2017 0745 Gross per 24 hour  Intake 1841.25 ml  Output 1275 ml  Net 566.25 ml   Filed Weights   10/18/17 0856  Weight: 69.9 kg (154 lb)    Exam: Patient is examined daily including today on 10/22/2017, exams remain the same as of yesterday except that has changed    General:  NAD  Cardiovascular: RRR  Respiratory: diffuse wheezing bilaterally  Abdomen: Soft/ND/NT, positive BS  Musculoskeletal: No Edema  Neuro: alert, oriented   Data Reviewed: Basic Metabolic Panel: Recent Labs  Lab 10/18/17 1015 10/19/17 0624 10/20/17 0550 10/21/17 0556 10/22/17 0635  NA 137 138 134* 136 136  K 4.3 4.6 4.2 4.5 4.0  CL 106 105 102 104 102  CO2 22 23 23  21* 23  GLUCOSE 109* 147* 164* 124* 123*  BUN 15 14 20 19 20   CREATININE 1.14 1.14 1.36* 1.64* 1.11  CALCIUM 9.0 9.3 9.4 8.8* 9.4  MG  --   --  2.3  --   --    Liver Function Tests: Recent Labs  Lab 10/19/17 0624  AST 26  ALT 17  ALKPHOS 59  BILITOT 0.6  PROT 8.3*  ALBUMIN 4.0   No results for input(s): LIPASE, AMYLASE in the last 168 hours. No results for input(s): AMMONIA in the last 168 hours. CBC: Recent Labs  Lab 10/18/17 1015 10/19/17 0624 10/22/17 0635  WBC 5.3 7.2 7.6  NEUTROABS  --   --  6.3  HGB 14.2 13.7 14.5  HCT 42.5 41.1 44.0  MCV 90.0 90.5 90.3  PLT 174 173 192   Cardiac Enzymes:  No results for input(s): CKTOTAL, CKMB, CKMBINDEX, TROPONINI in the last 168 hours. BNP (last 3 results) No results for input(s): BNP in the last 8760 hours.  ProBNP (last 3 results) No results for input(s): PROBNP in the last 8760 hours.  CBG: No results for input(s): GLUCAP in the last 168 hours.  Recent Results (from the past 240 hour(s))  Culture, expectorated sputum-assessment     Status: None   Collection Time: 10/19/17 10:09 AM  Result Value Ref Range Status   Specimen Description SPUTUM  Final   Special Requests NONE  Final   Sputum evaluation   Final    THIS SPECIMEN IS ACCEPTABLE FOR  SPUTUM CULTURE Performed at [facility], [place] [address], [place], [place] [address]    Report Status 10/19/2017 FINAL  Final  Culture, respiratory (NON-Expectorated)     Status: None (Preliminary result)   Collection Time: 10/19/17 10:09 AM  Result Value Ref Range Status   Specimen Description   Final    SPUTUM Performed at [facility] [address], [place], [place] [address]    Special Requests   Final    NONE Reflexed from [id number] Performed at [facility] [address], [place], [place] [address]    Gram Stain   Final    RARE WBC PRESENT, PREDOMINANTLY PMN FEW SQUAMOUS EPITHELIAL CELLS PRESENT FEW GRAM POSITIVE COCCI    Culture   Final    CULTURE REINCUBATED FOR BETTER GROWTH Performed at [facility], [place] [address], [place], [place] [address]    Report Status PENDING  Incomplete  Respiratory Panel by PCR     Status: None   Collection Time: 10/19/17 11:07 AM  Result Value Ref Range Status   Adenovirus NOT DETECTED NOT DETECTED Final   Coronavirus 229E NOT DETECTED NOT DETECTED Final   Coronavirus HKU1 NOT DETECTED NOT DETECTED Final   Coronavirus NL63 NOT DETECTED NOT DETECTED Final   Coronavirus OC43 NOT DETECTED NOT DETECTED Final   Metapneumovirus NOT DETECTED NOT DETECTED Final   Rhinovirus / Enterovirus NOT DETECTED NOT DETECTED Final   Influenza A NOT DETECTED NOT DETECTED Final   Influenza B NOT DETECTED NOT DETECTED Final   Parainfluenza Virus 1 NOT DETECTED NOT DETECTED Final   Parainfluenza Virus 2 NOT DETECTED NOT DETECTED Final   Parainfluenza Virus 3 NOT DETECTED NOT DETECTED Final   Parainfluenza Virus 4 NOT DETECTED NOT DETECTED Final   Respiratory Syncytial Virus NOT DETECTED NOT DETECTED Final   Bordetella pertussis NOT DETECTED NOT DETECTED Final   Chlamydophila pneumoniae NOT DETECTED NOT DETECTED Final   Mycoplasma pneumoniae NOT DETECTED NOT DETECTED Final    Comment: Performed  at [facility], [place] [address], [place], [place] [address]  MRSA PCR Screening     Status: None   Collection Time: 10/19/17 11:07 AM  Result Value Ref Range Status   MRSA by PCR NEGATIVE NEGATIVE Final    Comment:        The GeneXpert MRSA Assay (FDA approved for NASAL specimens only), is one component of a comprehensive MRSA colonization surveillance program. It is not intended to diagnose MRSA infection nor to guide or monitor treatment for MRSA infections. Performed at [facility], [place] [address], [place], [place] [address]      Studies: US Renal  Result Date: 10/21/2017 CLINICAL DATA:  Elevated creatinine. EXAM: RENAL / URINARY TRACT ULTRASOUND COMPLETE COMPARISON:  None. FINDINGS: Right Kidney: Length: 10.6  cm. Echogenicity within normal limits. No mass or hydronephrosis visualized. Left Kidney: Length: 10.7 cm. Echogenicity within normal limits. No mass or hydronephrosis visualized. Bladder: Appears normal for degree of bladder distention. The prostate gland is mildly enlarged 4.8 x 3.6 x 4.4 cm. IMPRESSION: Negative for hydronephrosis.  No acute abnormality. Prostatomegaly. Electronically Signed   By: Inge Rise M.D.   On: 10/21/2017 12:39    Scheduled Meds: . amLODipine  5 mg Oral Daily  . azithromycin  250 mg Oral QHS  . enoxaparin (LOVENOX) injection  40 mg Subcutaneous Q24H  . gabapentin  300 mg Oral TID  . guaiFENesin  1,200 mg Oral BID  . ipratropium-albuterol  3 mL Nebulization Q6H  . loratadine  10 mg Oral Daily  . methylPREDNISolone (SOLU-MEDROL) injection  60 mg Intravenous Q8H  . montelukast  10 mg Oral QHS  . tamsulosin  0.8 mg Oral QPC supper    Continuous Infusions: . sodium chloride Stopped (10/19/17 1511)  . albuterol 10 mg/hr (10/18/17 1207)  . cefTRIAXone (ROCEPHIN)  IV Stopped (10/21/17 1632)     Time spent: 67mins I have personally reviewed and interpreted on  10/22/2017 daily labs, tele strips, imagings as  discussed above under date review session and assessment and plans.  I reviewed all nursing notes,  vitals, pertinent old records  I have discussed plan of care as described above with RN , patient  on 10/22/2017   Florencia Reasons MD, PhD  [facility] Pager [phone]. If 7PM-7AM, please contact night-coverage at [email or web address], password [facility] 10/22/2017, 10:50 AM  LOS: 4 days

## 2017-10-23 DIAGNOSIS — R0902 Hypoxemia: Secondary | ICD-10-CM

## 2017-10-23 DIAGNOSIS — I10 Essential (primary) hypertension: Secondary | ICD-10-CM

## 2017-10-23 DIAGNOSIS — N179 Acute kidney failure, unspecified: Secondary | ICD-10-CM

## 2017-10-23 DIAGNOSIS — J441 Chronic obstructive pulmonary disease with (acute) exacerbation: Principal | ICD-10-CM

## 2017-10-23 LAB — BASIC METABOLIC PANEL
Anion gap: 13 (ref 5–15)
BUN: 20 mg/dL (ref 6–20)
CALCIUM: 9 mg/dL (ref 8.9–10.3)
CO2: 22 mmol/L (ref 22–32)
CREATININE: 1.13 mg/dL (ref 0.61–1.24)
Chloride: 99 mmol/L — ABNORMAL LOW (ref 101–111)
GFR calc Af Amer: >60 mL/min (ref >60)
GFR calc non Af Amer: >60 mL/min (ref >60)
GLUCOSE: 175 mg/dL — AB (ref 65–99)
Potassium: 3.8 mmol/L (ref 3.5–5.1)
SODIUM: 134 mmol/L — AB (ref 135–145)

## 2017-10-23 MED ORDER — METHYLPREDNISOLONE SODIUM SUCC 40 MG IJ SOLR
40.0000 mg | Freq: Three times a day (TID) | INTRAMUSCULAR | Status: DC
  Administered 2017-10-23 – 2017-10-24 (×4): 40 mg via INTRAVENOUS
  Filled 2017-10-23 (×4): qty 1

## 2017-10-23 MED ORDER — AMLODIPINE BESYLATE 10 MG PO TABS
10.0000 mg | ORAL_TABLET | Freq: Every day | ORAL | Status: DC
  Administered 2017-10-24: 10 mg via ORAL
  Filled 2017-10-23: qty 1

## 2017-10-23 MED ORDER — IPRATROPIUM-ALBUTEROL 0.5-2.5 (3) MG/3ML IN SOLN
3.0000 mL | Freq: Three times a day (TID) | RESPIRATORY_TRACT | Status: DC
  Administered 2017-10-24: 3 mL via RESPIRATORY_TRACT
  Filled 2017-10-23: qty 3

## 2017-10-23 MED ORDER — MOMETASONE FURO-FORMOTEROL FUM 100-5 MCG/ACT IN AERO
2.0000 | INHALATION_SPRAY | Freq: Two times a day (BID) | RESPIRATORY_TRACT | Status: DC
  Administered 2017-10-23 – 2017-10-24 (×3): 2 via RESPIRATORY_TRACT
  Filled 2017-10-23: qty 8.8

## 2017-10-23 MED ORDER — IPRATROPIUM-ALBUTEROL 0.5-2.5 (3) MG/3ML IN SOLN: 3.0000 mL | RESPIRATORY_TRACT | Status: DC | PRN

## 2017-10-23 NOTE — Evaluation (Signed)
Physical Therapy Evaluation-1x Patient Details Name: Gregory Christensen MRN: [id number] DOB: Nov 10, 1950 Today's Date: 10/23/2017   History of Present Illness  67 yo male admitted with COPD exac. Hx of COPD, chronic pain, arthritis, CKD  Clinical Impression  On eval, pt was Supervision level assist for mobility. He walked ~150 feet while using a straight cane. O2 sats 97% on RA at rest, 93% on RA during ambulation. Dyspnea 2/4 and audible wheezing noted during ambulation. Do not anticipate any f/u PT needs. Recommend daily ambulation in hallway with nursing supervision. 1x eval. Will sign off.     Follow Up Recommendations No PT follow up    Equipment Recommendations  None recommended by PT    Recommendations for Other Services       Precautions / Restrictions        Mobility  Bed Mobility Overal bed mobility: Modified Independent                Transfers Overall transfer level: Modified independent                  Ambulation/Gait Ambulation/Gait assistance: Supervision Ambulation Distance (Feet): 150 Feet Assistive device: Straight cane Gait Pattern/deviations: Step-through pattern     General Gait Details: cues for pacing. 1 standing rest break. O2 sats 93% on RA, dyspnea 2/4. Audible wheezing noted as well.   Stairs            Wheelchair Mobility    Modified Rankin (Stroke Patients Only)       Balance Overall balance assessment: Mild deficits observed, not formally tested                                           Pertinent Vitals/Pain Pain Assessment: Faces Faces Pain Scale: Hurts little more Pain Location: bil LEs (chronic pain/arthritis) Pain Descriptors / Indicators: Sore;Aching Pain Intervention(s): Monitored during session    Home Living Family/patient expects to be discharged to:: Private residence Living Arrangements: Parent(mom-"40 years old")   Type of Home: House Home Access: Stairs to enter Entrance  Stairs-Rails: None Entrance Stairs-Number of Steps: 1-2 small steps Home Layout: One level Home Equipment: Cane - single point Additional Comments: pt has borrowed cane from his mom???    Prior Function Level of Independence: Independent with assistive device(s)         Comments: uses cane sometimes. can walk without it.     Hand Dominance        Extremity/Trunk Assessment   Upper Extremity Assessment Upper Extremity Assessment: Overall WFL for tasks assessed    Lower Extremity Assessment Lower Extremity Assessment: Overall WFL for tasks assessed    Cervical / Trunk Assessment Cervical / Trunk Assessment: Normal  Communication   Communication: No difficulties  Cognition Arousal/Alertness: Awake/alert Behavior During Therapy: WFL for tasks assessed/performed Overall Cognitive Status: Within Functional Limits for tasks assessed                                        General Comments      Exercises     Assessment/Plan    PT Assessment Patent does not need any further PT services  PT Problem List         PT Treatment Interventions      PT Goals (Current goals can be  found in the Care Plan section)  Acute Rehab PT Goals Patient Stated Goal: to get better PT Goal Formulation: All assessment and education complete, DC therapy    Frequency     Barriers to discharge        Co-evaluation               AM-PAC PT "6 Clicks" Daily Activity  Outcome Measure Difficulty turning over in bed (including adjusting bedclothes, sheets and blankets)?: None Difficulty moving from lying on back to sitting on the side of the bed? : None Difficulty sitting down on and standing up from a chair with arms (e.g., wheelchair, bedside commode, etc,.)?: None Help needed moving to and from a bed to chair (including a wheelchair)?: None Help needed walking in hospital room?: None Help needed climbing 3-5 steps with a railing? : A Little 6 Click Score:  23    End of Session   Activity Tolerance: Patient tolerated treatment well Patient left: in bed;with call bell/phone within reach        Time: 1417-1435 PT Time Calculation (min) (ACUTE ONLY): 18 min   Charges:   PT Evaluation $PT Eval Moderate Complexity: 1 Mod     PT G Codes:          Weston Anna, MPT Pager: [phone]

## 2017-10-23 NOTE — Progress Notes (Signed)
Patient ID: Gregory Christensen, male   DOB: 1951/01/20, 67 y.o.   MRN: [id number]  PROGRESS NOTE    Gregory Christensen  YQM:[id number] DOB: 09/11/1950 DOA: 10/18/2017 PCP: Leighton Ruff, MD   Brief Narrative:  67 year old male with history of chronic tobacco use, COPD presented on 10/18/2017 with shortness of breath.  CTA was negative for PE.  He was admitted with acute COPD exacerbation and started on intravenous Solu-Medrol.  Assessment & Plan:   Active Problems:   COPD exacerbation (HCC)   Hypoxia   Respiratory distress   Essential hypertension   COPD exacerbation causing hypoxia -Acute hypoxic restaurant failure has been ruled out -Currently requiring 1 L oxygen via nasal cannula.  Wean off as able -Still wheezing but patient feels a little better.  Decrease Solu-Medrol to 40 mg IV every 8 hours.  Continue duo nebs and Singulair.  Add Dulera -Discontinue antibiotics as admission CT chest was negative for infiltrates -Outpatient follow-up with pulmonary  Acute kidney injury on chronic kidney disease stage II -Resolved.  Creatinine stable.  Renal ultrasound was negative for hydronephrosis -Repeat a.m. labs  Chronic tobacco abuse -Counseled about tobacco cessation  Hypertension -Monitor blood pressure.  Increase amlodipine to 10 mill grams daily.  Blood pressure still elevated  BPH -Continue Flomax    DVT prophylaxis: Lovenox Code Status: Full  family Communication: None at bedside Disposition Plan: Home probably in a.m.  Consultants: None  Procedures: None  Antimicrobials:  Anti-infectives (From admission, onward)   Start     Dose/Rate Route Frequency Ordered Stop   10/21/17 1200  cefTRIAXone (ROCEPHIN) 1 g in sodium chloride 0.9 % 100 mL IVPB  Status:  Discontinued     1 g 200 mL/hr over 30 Minutes Intravenous Every 24 hours 10/21/17 0941 10/23/17 0902   10/18/17 2200  azithromycin (ZITHROMAX) tablet 250 mg  Status:  Discontinued     250 mg Oral Daily at bedtime  10/18/17 1933 10/23/17 0902         Subjective: Patient seen and examined at bedside.  He feels slightly better than yesterday but is still short of breath and coughing.  No overnight fever or vomiting  Objective: Vitals:   10/22/17 2044 10/23/17 0153 10/23/17 0524 10/23/17 0806  BP: (!) 175/87  (!) 152/88   Pulse: 88  92   Resp: 19  18   Temp: 97.7 F (36.5 C)  98.2 F (36.8 C)   TempSrc: Oral  Oral   SpO2: 99% 99% 99% 99%  Weight:      Height:        Intake/Output Summary (Last 24 hours) at 10/23/2017 1144 Last data filed at 10/23/2017 0524 Gross per 24 hour  Intake 580 ml  Output 1850 ml  Net -1270 ml   Filed Weights   10/18/17 0856  Weight: 69.9 kg (154 lb)    Examination:  General exam: Appears calm and comfortable  Respiratory system: Bilateral decreased breath sound at bases with scattered mild wheezing Cardiovascular system: S1 & S2 heard, rate controlled  gastrointestinal system: Abdomen is nondistended, soft and nontender. Normal bowel sounds heard. Central nervous system: Alert and oriented. No focal neurological deficits. Moving extremities Extremities: No cyanosis, clubbing, edema  Skin: No rashes, lesions or ulcers Psychiatry: Judgement and insight appear normal. Mood & affect appropriate.     Data Reviewed: I have personally reviewed following labs and imaging studies  CBC: Recent Labs  Lab 10/18/17 1015 10/19/17 0624 10/22/17 0635  WBC 5.3 7.2 7.6  NEUTROABS  --   --  6.3  HGB 14.2 13.7 14.5  HCT 42.5 41.1 44.0  MCV 90.0 90.5 90.3  PLT 174 173 196   Basic Metabolic Panel: Recent Labs  Lab 10/19/17 0624 10/20/17 0550 10/21/17 0556 10/22/17 0635 10/23/17 0618  NA 138 134* 136 136 134*  K 4.6 4.2 4.5 4.0 3.8  CL 105 102 104 102 99*  CO2 23 23 21* 23 22  GLUCOSE 147* 164* 124* 123* 175*  BUN 14 20 19 20 20   CREATININE 1.14 1.36* 1.64* 1.11 1.13  CALCIUM 9.3 9.4 8.8* 9.4 9.0  MG  --  2.3  --   --   --    GFR: Estimated  Creatinine Clearance: 63.6 mL/min (by C-G formula based on SCr of 1.13 mg/dL). Liver Function Tests: Recent Labs  Lab 10/19/17 0624  AST 26  ALT 17  ALKPHOS 59  BILITOT 0.6  PROT 8.3*  ALBUMIN 4.0   No results for input(s): LIPASE, AMYLASE in the last 168 hours. No results for input(s): AMMONIA in the last 168 hours. Coagulation Profile: No results for input(s): INR, PROTIME in the last 168 hours. Cardiac Enzymes: No results for input(s): CKTOTAL, CKMB, CKMBINDEX, TROPONINI in the last 168 hours. BNP (last 3 results) No results for input(s): PROBNP in the last 8760 hours. HbA1C: No results for input(s): HGBA1C in the last 72 hours. CBG: No results for input(s): GLUCAP in the last 168 hours. Lipid Profile: No results for input(s): CHOL, HDL, LDLCALC, TRIG, CHOLHDL, LDLDIRECT in the last 72 hours. Thyroid Function Tests: No results for input(s): TSH, T4TOTAL, FREET4, T3FREE, THYROIDAB in the last 72 hours. Anemia Panel: No results for input(s): VITAMINB12, FOLATE, FERRITIN, TIBC, IRON, RETICCTPCT in the last 72 hours. Sepsis Labs: No results for input(s): PROCALCITON, LATICACIDVEN in the last 168 hours.  Recent Results (from the past 240 hour(s))  Culture, expectorated sputum-assessment     Status: None   Collection Time: 10/19/17 10:09 AM  Result Value Ref Range Status   Specimen Description SPUTUM  Final   Special Requests NONE  Final   Sputum evaluation   Final    THIS SPECIMEN IS ACCEPTABLE FOR SPUTUM CULTURE Performed at [facility], [place] [address] [place], [place] [address]    Report Status 10/19/2017 FINAL  Final  Culture, respiratory (NON-Expectorated)     Status: None   Collection Time: 10/19/17 10:09 AM  Result Value Ref Range Status   Specimen Description   Final    SPUTUM Performed at [facility] [address], [place], [place] [address]    Special Requests   Final    NONE Reflexed from [id number] Performed at  [facility] [address], [place], [place] [address]    Gram Stain   Final    RARE WBC PRESENT, PREDOMINANTLY PMN FEW SQUAMOUS EPITHELIAL CELLS PRESENT FEW GRAM POSITIVE COCCI    Culture   Final    MODERATE Consistent with normal respiratory flora. Performed at [facility], [place] [address], [place], [place] [address]    Report Status 10/22/2017 FINAL  Final  Respiratory Panel by PCR     Status: None   Collection Time: 10/19/17 11:07 AM  Result Value Ref Range Status   Adenovirus NOT DETECTED NOT DETECTED Final   Coronavirus 229E NOT DETECTED NOT DETECTED Final   Coronavirus HKU1 NOT DETECTED NOT DETECTED Final   Coronavirus NL63 NOT DETECTED NOT DETECTED Final   Coronavirus OC43 NOT DETECTED NOT DETECTED Final  Metapneumovirus NOT DETECTED NOT DETECTED Final   Rhinovirus / Enterovirus NOT DETECTED NOT DETECTED Final   Influenza A NOT DETECTED NOT DETECTED Final   Influenza B NOT DETECTED NOT DETECTED Final   Parainfluenza Virus 1 NOT DETECTED NOT DETECTED Final   Parainfluenza Virus 2 NOT DETECTED NOT DETECTED Final   Parainfluenza Virus 3 NOT DETECTED NOT DETECTED Final   Parainfluenza Virus 4 NOT DETECTED NOT DETECTED Final   Respiratory Syncytial Virus NOT DETECTED NOT DETECTED Final   Bordetella pertussis NOT DETECTED NOT DETECTED Final   Chlamydophila pneumoniae NOT DETECTED NOT DETECTED Final   Mycoplasma pneumoniae NOT DETECTED NOT DETECTED Final    Comment: Performed at [facility], [place] [address], [place], [place] [address]  MRSA PCR Screening     Status: None   Collection Time: 10/19/17 11:07 AM  Result Value Ref Range Status   MRSA by PCR NEGATIVE NEGATIVE Final    Comment:        The GeneXpert MRSA Assay (FDA approved for NASAL specimens only), is one component of a comprehensive MRSA colonization surveillance program. It is not intended to diagnose MRSA infection nor to guide or monitor treatment for MRSA  infections. Performed at [facility], [place] [address], [place], [place] [address]          Radiology Studies: US Renal  Result Date: 10/21/2017 CLINICAL DATA:  Elevated creatinine. EXAM: RENAL / URINARY TRACT ULTRASOUND COMPLETE COMPARISON:  None. FINDINGS: Right Kidney: Length: 10.6 cm. Echogenicity within normal limits. No mass or hydronephrosis visualized. Left Kidney: Length: 10.7 cm. Echogenicity within normal limits. No mass or hydronephrosis visualized. Bladder: Appears normal for degree of bladder distention. The prostate gland is mildly enlarged 4.8 x 3.6 x 4.4 cm. IMPRESSION: Negative for hydronephrosis.  No acute abnormality. Prostatomegaly. Electronically Signed   By: Inge Rise M.D.   On: 10/21/2017 12:39        Scheduled Meds: . amLODipine  5 mg Oral Daily  . enoxaparin (LOVENOX) injection  40 mg Subcutaneous Q24H  . gabapentin  300 mg Oral TID  . guaiFENesin  1,200 mg Oral BID  . ipratropium-albuterol  3 mL Nebulization Q6H  . loratadine  10 mg Oral Daily  . methylPREDNISolone (SOLU-MEDROL) injection  40 mg Intravenous Q8H  . montelukast  10 mg Oral QHS  . tamsulosin  0.8 mg Oral QPC supper   Continuous Infusions: . albuterol 10 mg/hr (10/18/17 1207)     LOS: 5 days        Aline [place], MD [facility] Pager [phone]  If 7PM-7AM, please contact night-coverage [email or web address] Password [facility] [place] 10/23/2017, 11:44 AM

## 2017-10-24 LAB — CBC WITH DIFFERENTIAL/PLATELET
BASOS ABS: 0 10*3/uL (ref 0.0–0.1)
Basophils Relative: 0 %
Eosinophils Absolute: 0 10*3/uL (ref 0.0–0.7)
Eosinophils Relative: 0 %
HEMATOCRIT: 42.4 % (ref 39.0–52.0)
Hemoglobin: 14 g/dL (ref 13.0–17.0)
LYMPHS PCT: 14 %
Lymphs Abs: 1.2 10*3/uL (ref 0.7–4.0)
MCH: 30 pg (ref 26.0–34.0)
MCHC: 33 g/dL (ref 30.0–36.0)
MCV: 90.8 fL (ref 78.0–100.0)
MONO ABS: 0.9 10*3/uL (ref 0.1–1.0)
Monocytes Relative: 10 %
NEUTROS ABS: 7 10*3/uL (ref 1.7–7.7)
NEUTROS PCT: 76 %
Platelets: 218 10*3/uL (ref 150–400)
RBC: 4.67 MIL/uL (ref 4.22–5.81)
RDW: 14.7 % (ref 11.5–15.5)
WBC: 9.1 10*3/uL (ref 4.0–10.5)

## 2017-10-24 LAB — MAGNESIUM: Magnesium: 2.7 mg/dL — ABNORMAL HIGH (ref 1.7–2.4)

## 2017-10-24 LAB — BASIC METABOLIC PANEL
Anion gap: 11 (ref 5–15)
BUN: 21 mg/dL — ABNORMAL HIGH (ref 6–20)
CO2: 25 mmol/L (ref 22–32)
Calcium: 9.3 mg/dL (ref 8.9–10.3)
Chloride: 100 mmol/L — ABNORMAL LOW (ref 101–111)
Creatinine, Ser: 1.32 mg/dL — ABNORMAL HIGH (ref 0.61–1.24)
GFR, EST NON AFRICAN AMERICAN: 55 mL/min — AB (ref >60)
Glucose, Bld: 107 mg/dL — ABNORMAL HIGH (ref 65–99)
POTASSIUM: 4.2 mmol/L (ref 3.5–5.1)
Sodium: 136 mmol/L (ref 135–145)

## 2017-10-24 MED ORDER — PREDNISONE 20 MG PO TABS: 40.0000 mg | ORAL_TABLET | Freq: Every day | ORAL | 0 refills | Status: AC

## 2017-10-24 MED ORDER — IPRATROPIUM-ALBUTEROL 0.5-2.5 (3) MG/3ML IN SOLN: 3.0000 mL | Freq: Two times a day (BID) | RESPIRATORY_TRACT | Status: DC

## 2017-10-24 MED ORDER — AMLODIPINE BESYLATE 10 MG PO TABS: 10.0000 mg | ORAL_TABLET | Freq: Every day | ORAL | 0 refills | Status: DC

## 2017-10-24 MED ORDER — MONTELUKAST SODIUM 10 MG PO TABS: 10.0000 mg | ORAL_TABLET | Freq: Every day | ORAL | 0 refills | Status: DC

## 2017-10-24 MED ORDER — GUAIFENESIN ER 600 MG PO TB12: 1200.0000 mg | ORAL_TABLET | Freq: Two times a day (BID) | ORAL | 0 refills | Status: AC

## 2017-10-24 NOTE — Discharge Summary (Signed)
Physician Discharge Summary  Gregory Christensen WJX:[id number] DOB: [birth year] DOA: 10/18/2017  PCP: Leighton Ruff, MD  Admit date: 10/18/2017 Discharge date: 10/24/2017  Admitted From: Home Disposition:  Home  Recommendations for Outpatient Follow-up:  1. Follow up with PCP in 1 week 2. Outpatient evaluation and follow-up with pulmonary  Home Health: No Equipment/Devices: None Discharge Condition: Stable CODE STATUS: Full Diet recommendation: Heart Healthy   Brief/Interim Summary: 67 year old male with history of chronic tobacco use, COPD presented on 10/18/2017 with shortness of breath.  CTA was negative for PE.  He was admitted with acute COPD exacerbation and started on intravenous Solu-Medrol.  Patient had a long hospitalization and slow recovery.  CT chest was negative for infiltrates.  Initially patient was started on antibiotics which were discontinued.  Respiratory status improved.  Currently on room air.  He will be discharged home on oral prednisone with outpatient follow-up with primary care provider and may need outpatient pulmonary evaluation.    Discharge Diagnoses:  Active Problems:   COPD exacerbation (HCC)   Hypoxia   Respiratory distress   Essential hypertension  COPD exacerbation causing hypoxia -Acute hypoxic respiratory failure has been ruled out -Hypoxia has resolved.  Currently on room air -Still wheezing but much better.    Currently on Solu-Medrol to 40 mg IV every 8 hours.   -Discharged on oral prednisone 40 mg daily for 7 days.  Continue Symbicort, Singulair and as needed albuterol at home -Discontinued antibiotics as admission CT chest was negative for infiltrates -Outpatient follow-up with pulmonary  Acute kidney injury on chronic kidney disease stage II -Resolved.  Creatinine stable.  Renal ultrasound was negative for hydronephrosis -Patient follow-up with primary care provider  Chronic tobacco abuse -Counseled about tobacco  cessation  Hypertension -Outpatient follow-up.  Continue amlodipine  BPH -Continue Flomax    Discharge Instructions  Discharge Instructions    Ambulatory referral to Pulmonology   Complete by:  As directed    COPD exacerbation   Call MD for:  difficulty breathing, headache or visual disturbances   Complete by:  As directed    Call MD for:  extreme fatigue   Complete by:  As directed    Call MD for:  hives   Complete by:  As directed    Call MD for:  persistant dizziness or light-headedness   Complete by:  As directed    Call MD for:  persistant nausea and vomiting   Complete by:  As directed    Call MD for:  severe uncontrolled pain   Complete by:  As directed    Call MD for:  temperature >100.4   Complete by:  As directed    Diet - low sodium heart healthy   Complete by:  As directed    Increase activity slowly   Complete by:  As directed      Allergies as of 10/24/2017   No Known Allergies     Medication List    STOP taking these medications   albuterol-ipratropium 18-103 MCG/ACT inhaler Commonly known as:  COMBIVENT   azithromycin 250 MG tablet Commonly known as:  ZITHROMAX   diclofenac 75 MG EC tablet Commonly known as:  VOLTAREN   doxycycline 100 MG capsule Commonly known as:  VIBRAMYCIN   HYDROcodone-acetaminophen 5-325 MG tablet Commonly known as:  NORCO/VICODIN   ibuprofen 200 MG tablet Commonly known as:  ADVIL,MOTRIN   meloxicam 7.5 MG tablet Commonly known as:  MOBIC   pantoprazole 20 MG tablet Commonly known as:  PROTONIX  TAKE these medications   acetaminophen 500 MG tablet Commonly known as:  TYLENOL Take 1,000 mg by mouth every 6 (six) hours as needed for moderate pain or headache.   albuterol 108 (90 Base) MCG/ACT inhaler Commonly known as:  PROVENTIL HFA;VENTOLIN HFA Inhale 2 puffs into the lungs every 6 (six) hours as needed for wheezing or shortness of breath.   amLODipine 10 MG tablet Commonly known as:   NORVASC Take 1 tablet (10 mg total) by mouth daily. Start taking on:  10/25/2017 What changed:    medication strength  how much to take   budesonide-formoterol 160-4.5 MCG/ACT inhaler Commonly known as:  SYMBICORT Inhale 1 puff into the lungs 2 (two) times daily.   cetirizine 10 MG tablet Commonly known as:  ZYRTEC Take 10 mg by mouth daily as needed for allergies.   fluticasone 50 MCG/ACT nasal spray Commonly known as:  FLONASE Place 1 spray into both nostrils daily as needed for allergies or rhinitis.   gabapentin 300 MG capsule Commonly known as:  NEURONTIN Take 300 mg by mouth 3 (three) times daily.   guaiFENesin 600 MG 12 hr tablet Commonly known as:  MUCINEX Take 2 tablets (1,200 mg total) by mouth 2 (two) times daily for 10 days.   methocarbamol 500 MG tablet Commonly known as:  ROBAXIN Take 500 mg by mouth at bedtime as needed for muscle spasms.   montelukast 10 MG tablet Commonly known as:  SINGULAIR Take 1 tablet (10 mg total) by mouth at bedtime.   predniSONE 20 MG tablet Commonly known as:  DELTASONE Take 2 tablets (40 mg total) by mouth daily for 7 days.   tamsulosin 0.4 MG Caps capsule Commonly known as:  FLOMAX Take 0.4 mg by mouth daily after supper.      Follow-up Information    Leighton Ruff, MD. Schedule an appointment as soon as possible for a visit in 1 week(s).   Specialty:  Family Medicine Contact information: [place] [address] [phone]          No Known Allergies  Consultations:  None   Procedures/Studies: Dg Chest 2 View  Result Date: 10/18/2017 CLINICAL DATA:  Shortness of breath and chest pain EXAM: CHEST - 2 VIEW COMPARISON:  Nov 29, 2016. FINDINGS: There is no edema or consolidation. The heart size and pulmonary vascularity are normal. No adenopathy. No bone lesions. No pneumothorax. IMPRESSION: No edema or consolidation. Electronically Signed   By: Lowella Grip III M.D.   On:  10/18/2017 09:57   Ct Angio Chest Pe W And/or Wo Contrast  Result Date: 10/18/2017 CLINICAL DATA:  Short of breath. EXAM: CT ANGIOGRAPHY CHEST WITH CONTRAST TECHNIQUE: Multidetector CT imaging of the chest was performed using the standard protocol during bolus administration of intravenous contrast. Multiplanar CT image reconstructions and MIPs were obtained to evaluate the vascular anatomy. CONTRAST:  116mL ISOVUE-370 IOPAMIDOL (ISOVUE-370) INJECTION 76% COMPARISON:  Chest radiograph earlier today. FINDINGS: Considerable respiratory motion. Cardiovascular: Satisfactory opacification of the pulmonary arteries to the segmental level. No evidence of pulmonary embolism. Normal heart size. No pericardial effusion. Aortic atherosclerosis. Coronary artery calcification. Mediastinum/Nodes: No enlarged mediastinal, hilar, or axillary lymph nodes. Thyroid gland, trachea, and esophagus demonstrate no significant findings. Lungs/Pleura: Lungs are clear. No pleural effusion or pneumothorax. Emphysema. Upper Abdomen: No acute abnormality. Musculoskeletal: No chest wall abnormality. No acute or significant osseous findings. Review of the MIP images confirms the above findings. IMPRESSION: Considerable respiratory motion. No evidence of segmental pulmonary embolus.  No pneumonia or pneumothorax. Aortic Atherosclerosis (ICD10-I70.0) and Emphysema (ICD10-J43.9). Electronically Signed   By: Staci Righter M.D.   On: 10/18/2017 14:26   US Renal  Result Date: 10/21/2017 CLINICAL DATA:  Elevated creatinine. EXAM: RENAL / URINARY TRACT ULTRASOUND COMPLETE COMPARISON:  None. FINDINGS: Right Kidney: Length: 10.6 cm. Echogenicity within normal limits. No mass or hydronephrosis visualized. Left Kidney: Length: 10.7 cm. Echogenicity within normal limits. No mass or hydronephrosis visualized. Bladder: Appears normal for degree of bladder distention. The prostate gland is mildly enlarged 4.8 x 3.6 x 4.4 cm. IMPRESSION: Negative for  hydronephrosis.  No acute abnormality. Prostatomegaly. Electronically Signed   By: Inge Rise M.D.   On: 10/21/2017 12:39     Subjective: Patient seen and examined at bedside.  He feels much better and wants to go home.  His breathing has improved.  No overnight fever, nausea or vomiting.  Discharge Exam: Vitals:   10/24/17 0451 10/24/17 0804  BP: (!) 158/92   Pulse: 75 90  Resp: (!) 22 18  Temp: 98 F (36.7 C)   SpO2: 98% 98%   Vitals:   10/23/17 1925 10/23/17 1941 10/24/17 0451 10/24/17 0804  BP:  (!) 152/81 (!) 158/92   Pulse:  91 75 90  Resp:  (!) 21 (!) 22 18  Temp:  98.7 F (37.1 C) 98 F (36.7 C)   TempSrc:  Oral Oral   SpO2: 97% 97% 98% 98%  Weight:      Height:        General: Pt is alert, awake, not in acute distress Cardiovascular: Rate controlled, S1/S2 + Respiratory: Bilateral decreased breath sounds at bases with very minimal wheezing Abdominal: Soft, NT, ND, bowel sounds + Extremities: no edema, no cyanosis    The results of significant diagnostics from this hospitalization (including imaging, microbiology, ancillary and laboratory) are listed below for reference.     Microbiology: Recent Results (from the past 240 hour(s))  Culture, expectorated sputum-assessment     Status: None   Collection Time: 10/19/17 10:09 AM  Result Value Ref Range Status   Specimen Description SPUTUM  Final   Special Requests NONE  Final   Sputum evaluation   Final    THIS SPECIMEN IS ACCEPTABLE FOR SPUTUM CULTURE Performed at [facility], [place] [address] [place], [place] [address]    Report Status 10/19/2017 FINAL  Final  Culture, respiratory (NON-Expectorated)     Status: None   Collection Time: 10/19/17 10:09 AM  Result Value Ref Range Status   Specimen Description   Final    SPUTUM Performed at [place] [address], [place], [place] [address]    Special Requests   Final    NONE Reflexed from  [phone] Performed at [place] [address] [place], [place] [address]    Gram Stain   Final    RARE WBC PRESENT, PREDOMINANTLY PMN FEW SQUAMOUS EPITHELIAL CELLS PRESENT FEW GRAM POSITIVE COCCI    Culture   Final    MODERATE Consistent with normal respiratory flora. Performed at [facility], [place] [address], [place], [place] [address]    Report Status 10/22/2017 FINAL  Final  Respiratory Panel by PCR     Status: None   Collection Time: 10/19/17 11:07 AM  Result Value Ref Range Status   Adenovirus NOT DETECTED NOT DETECTED Final   Coronavirus 229E NOT DETECTED NOT DETECTED Final   Coronavirus HKU1 NOT DETECTED NOT DETECTED Final  Coronavirus NL63 NOT DETECTED NOT DETECTED Final   Coronavirus OC43 NOT DETECTED NOT DETECTED Final   Metapneumovirus NOT DETECTED NOT DETECTED Final   Rhinovirus / Enterovirus NOT DETECTED NOT DETECTED Final   Influenza A NOT DETECTED NOT DETECTED Final   Influenza B NOT DETECTED NOT DETECTED Final   Parainfluenza Virus 1 NOT DETECTED NOT DETECTED Final   Parainfluenza Virus 2 NOT DETECTED NOT DETECTED Final   Parainfluenza Virus 3 NOT DETECTED NOT DETECTED Final   Parainfluenza Virus 4 NOT DETECTED NOT DETECTED Final   Respiratory Syncytial Virus NOT DETECTED NOT DETECTED Final   Bordetella pertussis NOT DETECTED NOT DETECTED Final   Chlamydophila pneumoniae NOT DETECTED NOT DETECTED Final   Mycoplasma pneumoniae NOT DETECTED NOT DETECTED Final    Comment: Performed at [facility], [place] [address] [place], [place] [address]  MRSA PCR Screening     Status: None   Collection Time: 10/19/17 11:07 AM  Result Value Ref Range Status   MRSA by PCR NEGATIVE NEGATIVE Final    Comment:        The GeneXpert MRSA Assay (FDA approved for NASAL specimens only), is one component of a comprehensive MRSA colonization surveillance program. It is not intended to diagnose MRSA infection nor to guide or monitor treatment  for MRSA infections. Performed at [facility], [place] [address], [place], [place] [address]      Labs: BNP (last 3 results) No results for input(s): BNP in the last 8760 hours. Basic Metabolic Panel: Recent Labs  Lab 10/20/17 0550 10/21/17 0556 10/22/17 0635 10/23/17 0618 10/24/17 0602  NA 134* 136 136 134* 136  K 4.2 4.5 4.0 3.8 4.2  CL 102 104 102 99* 100*  CO2 23 21* 23 22 25   GLUCOSE 164* 124* 123* 175* 107*  BUN 20 19 20 20  21*  CREATININE 1.36* 1.64* 1.11 1.13 1.32*  CALCIUM 9.4 8.8* 9.4 9.0 9.3  MG 2.3  --   --   --  2.7*   Liver Function Tests: Recent Labs  Lab 10/19/17 0624  AST 26  ALT 17  ALKPHOS 59  BILITOT 0.6  PROT 8.3*  ALBUMIN 4.0   No results for input(s): LIPASE, AMYLASE in the last 168 hours. No results for input(s): AMMONIA in the last 168 hours. CBC: Recent Labs  Lab 10/18/17 1015 10/19/17 0624 10/22/17 0635 10/24/17 0602  WBC 5.3 7.2 7.6 9.1  NEUTROABS  --   --  6.3 7.0  HGB 14.2 13.7 14.5 14.0  HCT 42.5 41.1 44.0 42.4  MCV 90.0 90.5 90.3 90.8  PLT 174 173 192 218   Cardiac Enzymes: No results for input(s): CKTOTAL, CKMB, CKMBINDEX, TROPONINI in the last 168 hours. BNP: Invalid input(s): POCBNP CBG: No results for input(s): GLUCAP in the last 168 hours. D-Dimer No results for input(s): DDIMER in the last 72 hours. Hgb A1c No results for input(s): HGBA1C in the last 72 hours. Lipid Profile No results for input(s): CHOL, HDL, LDLCALC, TRIG, CHOLHDL, LDLDIRECT in the last 72 hours. Thyroid function studies No results for input(s): TSH, T4TOTAL, T3FREE, THYROIDAB in the last 72 hours.  Invalid input(s): FREET3 Anemia work up No results for input(s): VITAMINB12, FOLATE, FERRITIN, TIBC, IRON, RETICCTPCT in the last 72 hours. Urinalysis    Component Value Date/Time   COLORURINE YELLOW 10/20/2017 [place] 10/20/2017 0824   LABSPEC 1.011 10/20/2017 0824   PHURINE 6.0 10/20/2017 0824    GLUCOSEU NEGATIVE 10/20/2017 9678  HGBUR SMALL (A) 10/20/2017 0824   BILIRUBINUR NEGATIVE 10/20/2017 0824   KETONESUR NEGATIVE 10/20/2017 0824   PROTEINUR NEGATIVE 10/20/2017 0824   NITRITE NEGATIVE 10/20/2017 0824   LEUKOCYTESUR LARGE (A) 10/20/2017 0824   Sepsis Labs Invalid input(s): PROCALCITONIN,  WBC,  LACTICIDVEN Microbiology Recent Results (from the past 240 hour(s))  Culture, expectorated sputum-assessment     Status: None   Collection Time: 10/19/17 10:09 AM  Result Value Ref Range Status   Specimen Description SPUTUM  Final   Special Requests NONE  Final   Sputum evaluation   Final    THIS SPECIMEN IS ACCEPTABLE FOR SPUTUM CULTURE Performed at [facility], [place] [address] [place], [place] [address]    Report Status 10/19/2017 FINAL  Final  Culture, respiratory (NON-Expectorated)     Status: None   Collection Time: 10/19/17 10:09 AM  Result Value Ref Range Status   Specimen Description   Final    SPUTUM Performed at [place] [address] [place], [place] [address]    Special Requests   Final    NONE Reflexed from [id number] Performed at [place] [address] [place], [place] [address]    Gram Stain   Final    RARE WBC PRESENT, PREDOMINANTLY PMN FEW SQUAMOUS EPITHELIAL CELLS PRESENT FEW GRAM POSITIVE COCCI    Culture   Final    MODERATE Consistent with normal respiratory flora. Performed at [facility], [place] [address], [place], [place] [address]    Report Status 10/22/2017 FINAL  Final  Respiratory Panel by PCR     Status: None   Collection Time: 10/19/17 11:07 AM  Result Value Ref Range Status   Adenovirus NOT DETECTED NOT DETECTED Final   Coronavirus 229E NOT DETECTED NOT DETECTED Final   Coronavirus HKU1 NOT DETECTED NOT DETECTED Final   Coronavirus NL63 NOT DETECTED NOT DETECTED Final   Coronavirus OC43 NOT DETECTED NOT DETECTED Final   Metapneumovirus NOT DETECTED NOT  DETECTED Final   Rhinovirus / Enterovirus NOT DETECTED NOT DETECTED Final   Influenza A NOT DETECTED NOT DETECTED Final   Influenza B NOT DETECTED NOT DETECTED Final   Parainfluenza Virus 1 NOT DETECTED NOT DETECTED Final   Parainfluenza Virus 2 NOT DETECTED NOT DETECTED Final   Parainfluenza Virus 3 NOT DETECTED NOT DETECTED Final   Parainfluenza Virus 4 NOT DETECTED NOT DETECTED Final   Respiratory Syncytial Virus NOT DETECTED NOT DETECTED Final   Bordetella pertussis NOT DETECTED NOT DETECTED Final   Chlamydophila pneumoniae NOT DETECTED NOT DETECTED Final   Mycoplasma pneumoniae NOT DETECTED NOT DETECTED Final    Comment: Performed at [facility], [place] [address], [place], [place] [address]  MRSA PCR Screening     Status: None   Collection Time: 10/19/17 11:07 AM  Result Value Ref Range Status   MRSA by PCR NEGATIVE NEGATIVE Final    Comment:        The GeneXpert MRSA Assay (FDA approved for NASAL specimens only), is one component of a comprehensive MRSA colonization surveillance program. It is not intended to diagnose MRSA infection nor to guide or monitor treatment for MRSA infections. Performed at [facility], [place] [address] [place], [place] [address]      Time coordinating discharge: 35 minutes  SIGNED:   Aline August, MD  [facility] 10/24/2017, 10:37 AM Pager: [phone]  If 7PM-7AM, please contact night-coverage [email or web address] Password [email or web address]

## 2017-10-24 NOTE — Progress Notes (Signed)
SATURATION QUALIFICATIONS: (This note is used to comply with regulatory documentation for home oxygen)  Patient Saturations on Room Air at Rest = 97%  Patient Saturations on Room Air while Ambulating = 95%    Please briefly explain why patient needs home oxygen: Patient does not need home oxygen 

## 2017-10-24 NOTE — Progress Notes (Signed)
Patient verbalized understanding of discharge instructions. Patient is stable at discharge. Patient given AVS.

## 2017-10-31 DIAGNOSIS — E878 Other disorders of electrolyte and fluid balance, not elsewhere classified: Secondary | ICD-10-CM | POA: Diagnosis not present

## 2017-10-31 DIAGNOSIS — J441 Chronic obstructive pulmonary disease with (acute) exacerbation: Secondary | ICD-10-CM | POA: Diagnosis not present

## 2017-10-31 DIAGNOSIS — J449 Chronic obstructive pulmonary disease, unspecified: Secondary | ICD-10-CM | POA: Diagnosis not present

## 2017-10-31 DIAGNOSIS — Z87891 Personal history of nicotine dependence: Secondary | ICD-10-CM | POA: Diagnosis not present

## 2017-11-28 ENCOUNTER — Encounter: Payer: Self-pay | Admitting: Pulmonary Disease

## 2017-11-28 ENCOUNTER — Ambulatory Visit (INDEPENDENT_AMBULATORY_CARE_PROVIDER_SITE_OTHER): Payer: Medicare HMO | Admitting: Pulmonary Disease

## 2017-11-28 ENCOUNTER — Other Ambulatory Visit: Payer: No Typology Code available for payment source

## 2017-11-28 VITALS — BP 138/78 | HR 85 | Ht 73.0 in | Wt 162.2 lb

## 2017-11-28 DIAGNOSIS — J449 Chronic obstructive pulmonary disease, unspecified: Secondary | ICD-10-CM | POA: Diagnosis not present

## 2017-11-28 NOTE — Progress Notes (Signed)
Gregory Christensen    [id number]    December 22, 1950  Primary Care Physician:Barnes, Benjamine Mola, MD  Referring Physician: Aline August, MD [place], [place] [address]  Chief complaint: Consult for COPD  HPI: 67 year old with history of COPD [CAT Score 29], CKD, hypertension.  Admitted in April 2019 for COPD exacerbation treated with steroids.  He was initially on antibiotics which was stopped after CTA did not show any infiltrates or pulmonary embolism. Maintained on Symbicort and Spiriva.  After discharge she has been referred to pulmonary for further management.  He has heavy smoking history.  Quit after this hospitalization and has not started yet. States that he has chronic dyspnea on exertion, cough with sputum production. He usually follows up with the [facility] for all his medical care.  Pets: No pets, birds, farm animals Occupation: Retired Geographical information systems officer Exposures: No known exposures, no mold, hot tub, Jacuzzi Smoking history: 5200-pack-year smoking history.  Quit in April 2019 Travel history: No significant travel  Outpatient Encounter Medications as of 11/28/2017  Medication Sig  . acetaminophen (TYLENOL) 500 MG tablet Take 1,000 mg by mouth every 6 (six) hours as needed for moderate pain or headache.  . albuterol (PROVENTIL HFA;VENTOLIN HFA) 108 (90 BASE) MCG/ACT inhaler Inhale 2 puffs into the lungs every 6 (six) hours as needed for wheezing or shortness of breath.  Marland Kitchen amLODipine (NORVASC) 10 MG tablet Take 1 tablet (10 mg total) by mouth daily.  . budesonide-formoterol (SYMBICORT) 160-4.5 MCG/ACT inhaler Inhale 1 puff into the lungs 2 (two) times daily.  . cetirizine (ZYRTEC) 10 MG tablet Take 10 mg by mouth daily as needed for allergies.  . fluticasone (FLONASE) 50 MCG/ACT nasal spray Place 1 spray into both nostrils daily as needed for allergies or rhinitis.  Marland Kitchen gabapentin (NEURONTIN) 300 MG capsule Take 300 mg by mouth 3 (three) times daily.    . methocarbamol (ROBAXIN) 500 MG tablet Take 500 mg by mouth at bedtime as needed for muscle spasms.  . montelukast (SINGULAIR) 10 MG tablet Take 1 tablet (10 mg total) by mouth at bedtime.  . tamsulosin (FLOMAX) 0.4 MG CAPS capsule Take 0.4 mg by mouth daily after supper.  . Tiotropium Bromide Monohydrate (SPIRIVA RESPIMAT) 1.25 MCG/ACT AERS Inhale 2 puffs into the lungs daily.   No facility-administered encounter medications on file as of 11/28/2017.     Allergies as of 11/28/2017  . (No Known Allergies)    Past Medical History:  Diagnosis Date  . Arthritis   . Chronic back pain   . Chronic hip pain   . COPD (chronic obstructive pulmonary disease) (HCC)     No past surgical history on file.  No family history on file.  Social History   Socioeconomic History  . Marital status: Divorced    Spouse name: Not on file  . Number of children: Not on file  . Years of education: Not on file  . Highest education level: Not on file  Occupational History  . Not on file  Social Needs  . Financial resource strain: Not on file  . Food insecurity:    Worry: Not on file    Inability: Not on file  . Transportation needs:    Medical: Not on file    Non-medical: Not on file  Tobacco Use  . Smoking status: Former Smoker    Packs/day: 1.00    Years: 26.00    Pack years: 26.00  Types: Cigarettes    Last attempt to quit: 10/18/2017    Years since quitting: 0.1  . Smokeless tobacco: Never Used  Substance and Sexual Activity  . Alcohol use: Yes  . Drug use: No  . Sexual activity: Not on file  Lifestyle  . Physical activity:    Days per week: Not on file    Minutes per session: Not on file  . Stress: Not on file  Relationships  . Social connections:    Talks on phone: Not on file    Gets together: Not on file    Attends religious service: Not on file    Active member of club or organization: Not on file    Attends meetings of clubs or organizations: Not on file     Relationship status: Not on file  . Intimate partner violence:    Fear of current or ex partner: Not on file    Emotionally abused: Not on file    Physically abused: Not on file    Forced sexual activity: Not on file  Other Topics Concern  . Not on file  Social History Narrative  . Not on file    Review of systems: Review of Systems  Constitutional: Negative for fever and chills.  HENT: Negative.   Eyes: Negative for blurred vision.  Respiratory: as per HPI  Cardiovascular: Negative for chest pain and palpitations.  Gastrointestinal: Negative for vomiting, diarrhea, blood per rectum. Genitourinary: Negative for dysuria, urgency, frequency and hematuria.  Musculoskeletal: Negative for myalgias, back pain and joint pain.  Skin: Negative for itching and rash.  Neurological: Negative for dizziness, tremors, focal weakness, seizures and loss of consciousness.  Endo/Heme/Allergies: Negative for environmental allergies.  Psychiatric/Behavioral: Negative for depression, suicidal ideas and hallucinations.  All other systems reviewed and are negative.  Physical Exam: Blood pressure 138/78, pulse 85, height 6\' 1"  (1.854 m), weight 162 lb 3.2 oz (73.6 kg), SpO2 98 %. Gen:      No acute distress HEENT:  EOMI, sclera anicteric Neck:     No masses; no thyromegaly Lungs:    Clear to auscultation bilaterally; normal respiratory effort CV:         Regular rate and rhythm; no murmurs Abd:      + bowel sounds; soft, non-tender; no palpable masses, no distension Ext:    No edema; adequate peripheral perfusion Skin:      Warm and dry; no rash Neuro: alert and oriented x 3 Psych: normal mood and affect  Data Reviewed: CT angio 10/18/2017- no pulmonary embolism, severe paraseptal and centrilobular emphysema.  I have reviewed the images personally  Assessment:  Severe COPD, Emphysema Review of CT scan shows severe emphysematous changes and he has a extensive smoking history He had a recent  hospitalization for COPD exacerbation.  Symptoms are currently stable on Symbicort and Spiriva.  We will continue the same Check PFTs, CBC differential, alpha-1 antitrypsin levels and phenotype He did not desat on exertion today.  Health maintenance States that he is up-to-date with his immunizations at the [facility].  Plan/Recommendations: - Continue Symbicort, Spiriva - Check PFTs, CBC, alpha-1 antitrypsin levels and phenotype  Marshell Garfinkel MD [facility] 11/28/2017, 10:44 AM  CC: Aline August, MD

## 2017-11-28 NOTE — Patient Instructions (Signed)
Continue your Symbicort and Spiriva We will check alpha-1 antitrypsin levels, phenotype and schedule you for pulmonary function test We will check your oxygen levels on exertion today Check with your primary care at the [facility] or at [facility] about your pneumonia vaccination Follow-up in 3 months.

## 2017-12-03 DIAGNOSIS — Z23 Encounter for immunization: Secondary | ICD-10-CM | POA: Diagnosis not present

## 2017-12-03 DIAGNOSIS — J449 Chronic obstructive pulmonary disease, unspecified: Secondary | ICD-10-CM | POA: Diagnosis not present

## 2017-12-03 DIAGNOSIS — I10 Essential (primary) hypertension: Secondary | ICD-10-CM | POA: Diagnosis not present

## 2017-12-03 DIAGNOSIS — Z0000 Encounter for general adult medical examination without abnormal findings: Secondary | ICD-10-CM | POA: Diagnosis not present

## 2017-12-03 DIAGNOSIS — Z87891 Personal history of nicotine dependence: Secondary | ICD-10-CM | POA: Diagnosis not present

## 2017-12-03 LAB — ALPHA-1 ANTITRYPSIN PHENOTYPE: A1 ANTITRYPSIN SER: 121 mg/dL (ref 83–199)

## 2018-02-27 ENCOUNTER — Ambulatory Visit: Payer: No Typology Code available for payment source | Admitting: Pulmonary Disease

## 2018-04-09 DIAGNOSIS — J449 Chronic obstructive pulmonary disease, unspecified: Secondary | ICD-10-CM | POA: Diagnosis not present

## 2018-04-09 DIAGNOSIS — M5416 Radiculopathy, lumbar region: Secondary | ICD-10-CM | POA: Diagnosis not present

## 2018-04-09 DIAGNOSIS — F17210 Nicotine dependence, cigarettes, uncomplicated: Secondary | ICD-10-CM | POA: Diagnosis not present

## 2018-04-09 DIAGNOSIS — Z7951 Long term (current) use of inhaled steroids: Secondary | ICD-10-CM | POA: Diagnosis not present

## 2018-04-09 DIAGNOSIS — Z79899 Other long term (current) drug therapy: Secondary | ICD-10-CM | POA: Diagnosis not present

## 2018-04-09 DIAGNOSIS — M47896 Other spondylosis, lumbar region: Secondary | ICD-10-CM | POA: Diagnosis not present

## 2018-04-09 DIAGNOSIS — M79605 Pain in left leg: Secondary | ICD-10-CM | POA: Diagnosis not present

## 2018-04-09 DIAGNOSIS — M5136 Other intervertebral disc degeneration, lumbar region: Secondary | ICD-10-CM | POA: Diagnosis not present

## 2018-04-09 DIAGNOSIS — M25552 Pain in left hip: Secondary | ICD-10-CM | POA: Diagnosis not present

## 2018-04-09 DIAGNOSIS — M545 Low back pain: Secondary | ICD-10-CM | POA: Diagnosis not present

## 2018-04-09 DIAGNOSIS — M160 Bilateral primary osteoarthritis of hip: Secondary | ICD-10-CM | POA: Diagnosis not present

## 2018-05-02 ENCOUNTER — Encounter (HOSPITAL_COMMUNITY): Payer: Self-pay

## 2018-05-02 ENCOUNTER — Inpatient Hospital Stay (HOSPITAL_COMMUNITY): Payer: Medicare HMO

## 2018-05-02 ENCOUNTER — Inpatient Hospital Stay (HOSPITAL_COMMUNITY)
Admission: EM | Admit: 2018-05-02 | Discharge: 2018-05-04 | DRG: 190 | Disposition: A | Discharge status: Home / Self Care | Payer: Medicare HMO | Attending: Internal Medicine | Admitting: Internal Medicine

## 2018-05-02 ENCOUNTER — Emergency Department (HOSPITAL_COMMUNITY): Payer: Medicare HMO

## 2018-05-02 ENCOUNTER — Other Ambulatory Visit: Payer: Self-pay

## 2018-05-02 DIAGNOSIS — F17210 Nicotine dependence, cigarettes, uncomplicated: Secondary | ICD-10-CM | POA: Diagnosis present

## 2018-05-02 DIAGNOSIS — R402362 Coma scale, best motor response, obeys commands, at arrival to emergency department: Secondary | ICD-10-CM | POA: Diagnosis not present

## 2018-05-02 DIAGNOSIS — R0603 Acute respiratory distress: Secondary | ICD-10-CM

## 2018-05-02 DIAGNOSIS — J441 Chronic obstructive pulmonary disease with (acute) exacerbation: Principal | ICD-10-CM | POA: Diagnosis present

## 2018-05-02 DIAGNOSIS — I10 Essential (primary) hypertension: Secondary | ICD-10-CM | POA: Diagnosis present

## 2018-05-02 DIAGNOSIS — Z7951 Long term (current) use of inhaled steroids: Secondary | ICD-10-CM | POA: Diagnosis not present

## 2018-05-02 DIAGNOSIS — R402142 Coma scale, eyes open, spontaneous, at arrival to emergency department: Secondary | ICD-10-CM | POA: Diagnosis not present

## 2018-05-02 DIAGNOSIS — R402252 Coma scale, best verbal response, oriented, at arrival to emergency department: Secondary | ICD-10-CM | POA: Diagnosis not present

## 2018-05-02 DIAGNOSIS — E1159 Type 2 diabetes mellitus with other circulatory complications: Secondary | ICD-10-CM | POA: Diagnosis present

## 2018-05-02 DIAGNOSIS — F17200 Nicotine dependence, unspecified, uncomplicated: Secondary | ICD-10-CM | POA: Diagnosis not present

## 2018-05-02 DIAGNOSIS — R0689 Other abnormalities of breathing: Secondary | ICD-10-CM | POA: Diagnosis not present

## 2018-05-02 DIAGNOSIS — R0602 Shortness of breath: Secondary | ICD-10-CM | POA: Diagnosis not present

## 2018-05-02 DIAGNOSIS — J9601 Acute respiratory failure with hypoxia: Secondary | ICD-10-CM | POA: Diagnosis not present

## 2018-05-02 DIAGNOSIS — J9621 Acute and chronic respiratory failure with hypoxia: Secondary | ICD-10-CM | POA: Diagnosis not present

## 2018-05-02 DIAGNOSIS — Z79899 Other long term (current) drug therapy: Secondary | ICD-10-CM | POA: Diagnosis not present

## 2018-05-02 DIAGNOSIS — J80 Acute respiratory distress syndrome: Secondary | ICD-10-CM | POA: Diagnosis not present

## 2018-05-02 DIAGNOSIS — R062 Wheezing: Secondary | ICD-10-CM | POA: Diagnosis not present

## 2018-05-02 HISTORY — DX: Emphysema, unspecified: J43.9

## 2018-05-02 LAB — BASIC METABOLIC PANEL
ANION GAP: 8 (ref 5–15)
BUN: 12 mg/dL (ref 8–23)
CO2: 24 mmol/L (ref 22–32)
Calcium: 9.1 mg/dL (ref 8.9–10.3)
Chloride: 105 mmol/L (ref 98–111)
Creatinine, Ser: 1.13 mg/dL (ref 0.61–1.24)
GFR calc Af Amer: >60 mL/min (ref >60)
GFR calc non Af Amer: >60 mL/min (ref >60)
GLUCOSE: 112 mg/dL — AB (ref 70–99)
POTASSIUM: 3.6 mmol/L (ref 3.5–5.1)
Sodium: 137 mmol/L (ref 135–145)

## 2018-05-02 LAB — CBC WITH DIFFERENTIAL/PLATELET
ABS IMMATURE GRANULOCYTES: 0.02 10*3/uL (ref 0.00–0.07)
Basophils Absolute: 0 10*3/uL (ref 0.0–0.1)
Basophils Relative: 0 %
Eosinophils Absolute: 0.2 10*3/uL (ref 0.0–0.5)
Eosinophils Relative: 3 %
HEMATOCRIT: 44.7 % (ref 39.0–52.0)
Hemoglobin: 14.1 g/dL (ref 13.0–17.0)
IMMATURE GRANULOCYTES: 0 %
LYMPHS PCT: 21 %
Lymphs Abs: 1.4 10*3/uL (ref 0.7–4.0)
MCH: 29.1 pg (ref 26.0–34.0)
MCHC: 31.5 g/dL (ref 30.0–36.0)
MCV: 92.2 fL (ref 80.0–100.0)
MONO ABS: 0.6 10*3/uL (ref 0.1–1.0)
MONOS PCT: 9 %
NEUTROS ABS: 4.4 10*3/uL (ref 1.7–7.7)
NEUTROS PCT: 67 %
PLATELETS: 190 10*3/uL (ref 150–400)
RBC: 4.85 MIL/uL (ref 4.22–5.81)
RDW: 14.2 % (ref 11.5–15.5)
WBC: 6.7 10*3/uL (ref 4.0–10.5)
nRBC: 0 % (ref 0.0–0.2)

## 2018-05-02 LAB — D-DIMER, QUANTITATIVE (NOT AT ARMC): D DIMER QUANT: 2.8 ug{FEU}/mL — AB (ref 0.00–0.50)

## 2018-05-02 LAB — INFLUENZA PANEL BY PCR (TYPE A & B)
INFLAPCR: NEGATIVE
INFLBPCR: NEGATIVE

## 2018-05-02 LAB — TROPONIN I: Troponin I: <0.03 ng/mL (ref <0.03)

## 2018-05-02 MED ORDER — NICOTINE 21 MG/24HR TD PT24
21.0000 mg | MEDICATED_PATCH | Freq: Every day | TRANSDERMAL | Status: DC
  Administered 2018-05-03 – 2018-05-04 (×2): 21 mg via TRANSDERMAL
  Filled 2018-05-02 (×2): qty 1

## 2018-05-02 MED ORDER — GABAPENTIN 300 MG PO CAPS
300.0000 mg | ORAL_CAPSULE | Freq: Two times a day (BID) | ORAL | Status: DC
  Administered 2018-05-02 – 2018-05-04 (×4): 300 mg via ORAL
  Filled 2018-05-02 (×4): qty 1

## 2018-05-02 MED ORDER — ALBUTEROL SULFATE (2.5 MG/3ML) 0.083% IN NEBU: 3.0000 mL | INHALATION_SOLUTION | Freq: Four times a day (QID) | RESPIRATORY_TRACT | Status: DC | PRN

## 2018-05-02 MED ORDER — FLUTICASONE PROPIONATE 50 MCG/ACT NA SUSP
1.0000 | Freq: Two times a day (BID) | NASAL | Status: DC
  Administered 2018-05-03 – 2018-05-04 (×2): 1 via NASAL
  Filled 2018-05-02: qty 16

## 2018-05-02 MED ORDER — ACETAMINOPHEN 500 MG PO TABS: 1000.0000 mg | ORAL_TABLET | Freq: Four times a day (QID) | ORAL | Status: DC | PRN

## 2018-05-02 MED ORDER — TAMSULOSIN HCL 0.4 MG PO CAPS
0.4000 mg | ORAL_CAPSULE | Freq: Every day | ORAL | Status: DC
  Administered 2018-05-03: 0.4 mg via ORAL
  Filled 2018-05-02: qty 1

## 2018-05-02 MED ORDER — SODIUM CHLORIDE 0.9 % IJ SOLN
INTRAMUSCULAR | Status: AC
  Filled 2018-05-02: qty 50

## 2018-05-02 MED ORDER — LEVOFLOXACIN IN D5W 500 MG/100ML IV SOLN
500.0000 mg | INTRAVENOUS | Status: DC
  Administered 2018-05-02 – 2018-05-03 (×2): 500 mg via INTRAVENOUS
  Filled 2018-05-02 (×3): qty 100

## 2018-05-02 MED ORDER — AMLODIPINE BESYLATE 10 MG PO TABS
10.0000 mg | ORAL_TABLET | Freq: Every day | ORAL | Status: DC
  Administered 2018-05-03 – 2018-05-04 (×2): 10 mg via ORAL
  Filled 2018-05-02 (×2): qty 1

## 2018-05-02 MED ORDER — ALBUTEROL (5 MG/ML) CONTINUOUS INHALATION SOLN
INHALATION_SOLUTION | RESPIRATORY_TRACT | Status: AC
  Administered 2018-05-02: 17:00:00
  Filled 2018-05-02: qty 20

## 2018-05-02 MED ORDER — MONTELUKAST SODIUM 10 MG PO TABS
10.0000 mg | ORAL_TABLET | Freq: Every day | ORAL | Status: DC
  Administered 2018-05-02 – 2018-05-03 (×2): 10 mg via ORAL
  Filled 2018-05-02 (×2): qty 1

## 2018-05-02 MED ORDER — MOMETASONE FURO-FORMOTEROL FUM 200-5 MCG/ACT IN AERO
2.0000 | INHALATION_SPRAY | Freq: Two times a day (BID) | RESPIRATORY_TRACT | Status: DC
  Administered 2018-05-02 – 2018-05-04 (×4): 2 via RESPIRATORY_TRACT
  Filled 2018-05-02: qty 8.8

## 2018-05-02 MED ORDER — SODIUM CHLORIDE 0.9% FLUSH: 3.0000 mL | INTRAVENOUS | Status: DC | PRN

## 2018-05-02 MED ORDER — SODIUM CHLORIDE 0.9% FLUSH
3.0000 mL | Freq: Two times a day (BID) | INTRAVENOUS | Status: DC
  Administered 2018-05-02 – 2018-05-04 (×3): 3 mL via INTRAVENOUS

## 2018-05-02 MED ORDER — MAGNESIUM SULFATE 2 GM/50ML IV SOLN
2.0000 g | Freq: Once | INTRAVENOUS | Status: AC
  Administered 2018-05-02: 2 g via INTRAVENOUS
  Filled 2018-05-02: qty 50

## 2018-05-02 MED ORDER — ENOXAPARIN SODIUM 40 MG/0.4ML ~~LOC~~ SOLN
40.0000 mg | Freq: Every day | SUBCUTANEOUS | Status: DC
  Administered 2018-05-02 – 2018-05-03 (×2): 40 mg via SUBCUTANEOUS
  Filled 2018-05-02 (×2): qty 0.4

## 2018-05-02 MED ORDER — IOPAMIDOL (ISOVUE-370) INJECTION 76%
INTRAVENOUS | Status: AC
  Filled 2018-05-02: qty 100

## 2018-05-02 MED ORDER — SODIUM CHLORIDE 0.9 % IV SOLN
250.0000 mL | INTRAVENOUS | Status: DC | PRN
  Administered 2018-05-02: 250 mL via INTRAVENOUS

## 2018-05-02 MED ORDER — METHOCARBAMOL 500 MG PO TABS
500.0000 mg | ORAL_TABLET | Freq: Every evening | ORAL | Status: DC | PRN
  Administered 2018-05-03: 500 mg via ORAL
  Filled 2018-05-02: qty 1

## 2018-05-02 MED ORDER — IOPAMIDOL (ISOVUE-370) INJECTION 76%
100.0000 mL | Freq: Once | INTRAVENOUS | Status: AC | PRN
  Administered 2018-05-02: 100 mL via INTRAVENOUS

## 2018-05-02 MED ORDER — IPRATROPIUM-ALBUTEROL 0.5-2.5 (3) MG/3ML IN SOLN
3.0000 mL | Freq: Four times a day (QID) | RESPIRATORY_TRACT | Status: DC
  Administered 2018-05-02 – 2018-05-04 (×7): 3 mL via RESPIRATORY_TRACT
  Filled 2018-05-02 (×6): qty 3

## 2018-05-02 MED ORDER — PREDNISONE 20 MG PO TABS
40.0000 mg | ORAL_TABLET | Freq: Every day | ORAL | Status: DC
  Administered 2018-05-03: 40 mg via ORAL
  Filled 2018-05-02: qty 2

## 2018-05-02 MED ORDER — ALBUTEROL (5 MG/ML) CONTINUOUS INHALATION SOLN
10.0000 mg/h | INHALATION_SOLUTION | RESPIRATORY_TRACT | Status: DC
  Filled 2018-05-02: qty 20

## 2018-05-02 MED ORDER — LORATADINE 10 MG PO TABS
10.0000 mg | ORAL_TABLET | Freq: Every day | ORAL | Status: DC
  Administered 2018-05-03 – 2018-05-04 (×2): 10 mg via ORAL
  Filled 2018-05-02 (×2): qty 1

## 2018-05-02 NOTE — ED Provider Notes (Signed)
[place] DEPT Provider Note   CSN: [id number] Arrival date & time: 05/02/18  1543     History   Chief Complaint Chief Complaint  Patient presents with  . Shortness of Breath    HPI Gregory Christensen is a 67 y.o. male.  He has a history of COPD and continues to smoke.  He does not use home oxygen.  He is complaining of increased shortness of breath that started around 1 AM this morning.  He is tried to use his inhalers without any improvement.  He says his chest feels tight and he is been coughing but has been nonproductive.  No fevers no chills no chest pain no abdominal pain nausea vomiting leg swelling.  EMS found him he was in moderate respiratory distress and gave him 125 of Solu-Medrol 10 of albuterol and 1 mg of Atrovent.  The patient is improving upon arrival.  The history is provided by the patient.  Shortness of Breath  This is a recurrent problem. The problem occurs intermittently.The current episode started 12 to 24 hours ago. The problem has been rapidly improving. Associated symptoms include cough and wheezing. Pertinent negatives include no fever, no headaches, no rhinorrhea, no sore throat, no neck pain, no sputum production, no hemoptysis, no chest pain, no syncope, no vomiting, no abdominal pain, no rash, no leg pain and no leg swelling. The problem's precipitants include weather/humidity. He has tried inhaled steroids and beta-agonist inhalers for the symptoms. The treatment provided mild relief.    Past Medical History:  Diagnosis Date  . Arthritis   . Chronic back pain   . Chronic hip pain   . COPD (chronic obstructive pulmonary disease) [facility])     Patient Active Problem List   Diagnosis Date Noted  . COPD exacerbation ([place]) 10/18/2017  . Hypoxia   . Respiratory distress   . Essential hypertension     No past surgical history on file.      Home Medications    Prior to Admission medications   Medication Sig Start Date End  Date Taking? Authorizing Provider  acetaminophen (TYLENOL) 500 MG tablet Take 1,000 mg by mouth every 6 (six) hours as needed for moderate pain or headache.    [provider]  albuterol (PROVENTIL HFA;VENTOLIN HFA) 108 (90 BASE) MCG/ACT inhaler Inhale 2 puffs into the lungs every 6 (six) hours as needed for wheezing or shortness of breath.    [provider]  amLODipine (NORVASC) 10 MG tablet Take 1 tablet (10 mg total) by mouth daily. 10/25/17   Aline August, MD  budesonide-formoterol (SYMBICORT) 160-4.5 MCG/ACT inhaler Inhale 1 puff into the lungs 2 (two) times daily.    [provider]  cetirizine (ZYRTEC) 10 MG tablet Take 10 mg by mouth daily as needed for allergies.    [provider]  fluticasone (FLONASE) 50 MCG/ACT nasal spray Place 1 spray into both nostrils daily as needed for allergies or rhinitis.    [provider]  gabapentin (NEURONTIN) 300 MG capsule Take 300 mg by mouth 3 (three) times daily.    [provider]  methocarbamol (ROBAXIN) 500 MG tablet Take 500 mg by mouth at bedtime as needed for muscle spasms.    [provider]  montelukast (SINGULAIR) 10 MG tablet Take 1 tablet (10 mg total) by mouth at bedtime. 10/24/17   Aline August, MD  tamsulosin (FLOMAX) 0.4 MG CAPS capsule Take 0.4 mg by mouth daily after supper.    [provider]  Tiotropium Bromide Monohydrate (SPIRIVA RESPIMAT) 1.25 MCG/ACT AERS Inhale 2 puffs into the lungs daily.    [provider]    Family History No family history on file.  Social History Social History   Tobacco Use  . Smoking status: Former Smoker    Packs/day: 1.00    Years: 26.00    Pack years: 26.00    Types: Cigarettes    Last attempt to quit: 10/18/2017    Years since quitting: 0.5  . Smokeless tobacco: Never Used  Substance Use Topics  . Alcohol use: Yes  . Drug use: No     Allergies   Patient has no known allergies.   Review of  Systems Review of Systems  Constitutional: Negative for fever.  HENT: Negative for rhinorrhea and sore throat.   Eyes: Negative for visual disturbance.  Respiratory: Positive for cough, shortness of breath and wheezing. Negative for hemoptysis and sputum production.   Cardiovascular: Negative for chest pain, leg swelling and syncope.  Gastrointestinal: Negative for abdominal pain and vomiting.  Genitourinary: Negative for dysuria.  Musculoskeletal: Negative for neck pain.  Skin: Negative for rash.  Neurological: Negative for headaches.     Physical Exam Updated Vital Signs Ht 6\' 1"  (1.854 m)   Wt 72.6 kg   BMI 21.11 kg/m   Physical Exam  Constitutional: He appears well-developed and well-nourished.  HENT:  Head: Normocephalic and atraumatic.  Eyes: Conjunctivae are normal.  Neck: Neck supple.  Cardiovascular: Normal rate and regular rhythm.  No murmur heard. Pulmonary/Chest: Accessory muscle usage present. No stridor. Tachypnea noted. He has wheezes.  Abdominal: Soft. There is no tenderness.  Musculoskeletal: He exhibits no edema.       Right lower leg: Normal. He exhibits no tenderness and no edema.       Left lower leg: Normal. He exhibits no tenderness and no edema.  Neurological: He is alert. He has normal strength. No sensory deficit. GCS eye subscore is 4. GCS verbal subscore is 5. GCS motor subscore is 6.  Skin: Skin is warm and dry. Capillary refill takes less than 2 seconds.  Psychiatric: He has a normal mood and affect.  Nursing note and vitals reviewed.    ED Treatments / Results  Labs (all labs ordered are listed, but only abnormal results are displayed) Labs Reviewed  BASIC METABOLIC PANEL - Abnormal; Notable for the following components:      Result Value   Glucose, Bld 112 (*)    All other components within normal limits  CBC WITH DIFFERENTIAL/PLATELET  D-DIMER, QUANTITATIVE (NOT AT [facility])  INFLUENZA PANEL BY PCR (TYPE A & B)  TROPONIN I     EKG EKG Interpretation  Date/Time:  Friday May 02 2018 16:28:42 EDT Ventricular Rate:  90 PR Interval:    QRS Duration: 84 QT Interval:  379 QTC Calculation: 464 R Axis:   26 Text Interpretation:  Sinus rhythm Borderline T abnormalities, anterior leads similar to prior 4/19 Confirmed by Aletta Edouard [phone]) on 05/02/2018 4:41:31 PM   Radiology Dg Chest 2 View  Result Date: 05/02/2018 CLINICAL DATA:  EMS reports from home, increasing SOB x several days, hx of COPD and emphysema. Smoker, HTN EXAM: CHEST - 2 VIEW COMPARISON:  10/18/2017 FINDINGS: The heart size and mediastinal contours are within normal limits. Both lungs are clear. The visualized skeletal structures are unremarkable. IMPRESSION: No active cardiopulmonary disease. Electronically Signed   By: Nolon Nations M.D.   On: 05/02/2018 16:40    Procedures Procedures (including  critical care time)  Medications Ordered in ED Medications  albuterol (PROVENTIL,VENTOLIN) solution continuous neb (10 mg/hr Nebulization Not Given 05/02/18 1654)  levofloxacin (LEVAQUIN) IVPB 500 mg (has no administration in time range)  magnesium sulfate IVPB 2 g 50 mL (0 g Intravenous Stopped 05/02/18 1725)  albuterol (PROVENTIL, VENTOLIN) (5 MG/ML) 0.5% continuous inhalation solution (  Given 05/02/18 1654)     Initial Impression / Assessment and Plan / ED Course  I have reviewed the triage vital signs and the nursing notes.  Pertinent labs & imaging results that were available during my care of the patient were reviewed by me and considered in my medical decision making (see chart for details).  Clinical Course as of May 03 2107  Fri May 02, 2018  1658 Respiratory therapy started the patient on another hour-long neb after he returned from x-ray because he was more short of breath and needed to go on a couple liters of oxygen.   [MB]  1806 Reevaluated patient.  He still is dyspneic and is on a continuous neb.  He says he feels about  the same.  His work-up is been fairly unremarkable and I hope that he will be able to be discharged when his breathing is eased up.   [MB]  9480 Reevaluated patient.  He still is quite tachypneic and currently requiring oxygen.  I think he may need to be admitted so we will have the nurse during ambulation trial and see what his sats do.   [MB]  1952 Off oxygen patient desatted into the 80s just trying to stand at the side of the bed to urinate.  I have paged the hospitalist for admission.   [MB]  2015 Reviewed with tried hospitalist who is accepted the patient for admission.   [MB]    Clinical Course User Index [MB] Hayden Rasmussen, MD     Final Clinical Impressions(s) / ED Diagnoses   Final diagnoses:  COPD exacerbation [facility])    ED Discharge Orders    None       Hayden Rasmussen, MD 05/02/18 2108

## 2018-05-02 NOTE — ED Notes (Signed)
Called transport for update. Transport states she is busy and unsure when she will arrive.

## 2018-05-02 NOTE — ED Notes (Signed)
Transport called to take patient upstairs 

## 2018-05-02 NOTE — ED Notes (Signed)
Bed: UL84 Expected date:  Expected time:  Means of arrival:  Comments: EMS- 67 yo male, resp distress

## 2018-05-02 NOTE — H&P (Addendum)
History and Physical    Gregory Christensen DEY:[id number] DOB: [birth year] DOA: 05/02/2018  PCP: Gregory Ruff, MD  Patient coming from: home   Chief Complaint: shortness of breath  HPI: Gregory Christensen is a 67 y.o. male with medical history significant for copd,tobacco abuse,hypertension,presenting with above  Says symptoms began yesterday relatively abruptly (per ED provider patient said symptoms began about a week ago). Says developed relatively significant shortness of breath yesterday, at rest but worse with ambulation. Denies fevers, denies cough or increased sputumproduction. Not on oxygen at home. Continues to smoke 1 ppd; says iscompliant with home meds including inhalers. Does  Have some mild chest pressure in center ofchest, does not radiate. Says had some left calf swelling about a week ago that resolved with baclofen. No hemoptysis. Denies recent antibiotics. Denies drugs; occasional etoh.  In ED after treatment per ED provider pt developed severe sob when got up to ambulate, unable to ambulate a significant distance,with o2 sat dropping to 80s.  ED Course: magnesium, albuterol. Received solumedrol via ems. Cxr.   Review of Systems: As per HPI otherwise 10 point review of systems negative.    Past Medical History:  Diagnosis Date  . Arthritis   . Chronic back pain   . Chronic hip pain   . COPD (chronic obstructive pulmonary disease) ([place])   . Emphysema lung ([place])     History reviewed. No pertinent surgical history.   reports that he quit smoking about 6 months ago. His smoking use included cigarettes. He has a 26.00 pack-year smoking history. He has never used smokeless tobacco. He reports that he drinks alcohol. He reports that he does not use drugs.  No Known Allergies  History reviewed. No pertinent family history.  Prior to Admission medications   Medication Sig Start Date End Date Taking? Authorizing Provider  acetaminophen (TYLENOL) 500 MG tablet Take 1,000 mg  by mouth every 6 (six) hours as needed for moderate pain or headache.   Yes [provider]  albuterol (PROVENTIL HFA;VENTOLIN HFA) 108 (90 BASE) MCG/ACT inhaler Inhale 2 puffs into the lungs every 6 (six) hours as needed for wheezing or shortness of breath.   Yes [provider]  amLODipine (NORVASC) 10 MG tablet Take 1 tablet (10 mg total) by mouth daily. 10/25/17  Yes Aline August, MD  budesonide-formoterol (SYMBICORT) 160-4.5 MCG/ACT inhaler Inhale 1 puff into the lungs 2 (two) times daily.   Yes [provider]  cetirizine (ZYRTEC) 10 MG tablet Take 10 mg by mouth daily.    Yes [provider]  fluticasone (FLONASE) 50 MCG/ACT nasal spray Place 1 spray into both nostrils 2 (two) times daily.    Yes [provider]  gabapentin (NEURONTIN) 300 MG capsule Take 300 mg by mouth 2 (two) times daily.    Yes [provider]  methocarbamol (ROBAXIN) 500 MG tablet Take 500 mg by mouth at bedtime as needed for muscle spasms.   Yes [provider]  montelukast (SINGULAIR) 10 MG tablet Take 1 tablet (10 mg total) by mouth at bedtime. 10/24/17  Yes Aline August, MD  tamsulosin (FLOMAX) 0.4 MG CAPS capsule Take 0.4 mg by mouth daily after supper.   Yes [provider]  Tiotropium Bromide Monohydrate (SPIRIVA RESPIMAT) 1.25 MCG/ACT AERS Inhale 2 puffs into the lungs daily.   Yes [provider]    Physical Exam: Vitals:   05/02/18 1735 05/02/18 1800 05/02/18 1830 05/02/18 1844  BP: (!) 184/85 (!) 158/85 (!) 161/94 (!) 161/94  Pulse:  89 97 99 99  Resp: 20   20  Temp:      TempSrc:      SpO2: 96% 94% 94% 94%  Weight:      Height:        Constitutional: No acute distress Head: Atraumatic Eyes: Conjunctiva clear ENM: Moist mucous membranes. poordentition.  Neck: Supple Respiratory: expiratory wheezes throughout,no rhonchi, no focal findings Cardiovascular: Regular rate and rhythm. No murmurs/rubs/gallops. Abdomen:  Non-tender, non-distended. No masses. No rebound or guarding. Positive bowel sounds. Musculoskeletal: No joint deformity upper and lower extremities. Normal ROM, no contractures. Normal muscle tone.  Skin: No rashes, lesions, or ulcers.  Extremities: No peripheral edema. Palpable peripheral pulses. Neurologic: Alert, moving all 4 extremities. Psychiatric: Normal insight and judgement.   Labs on Admission: I have personally reviewed following labs and imaging studies  CBC: Recent Labs  Lab 05/02/18 1619  WBC 6.7  NEUTROABS 4.4  HGB 14.1  HCT 44.7  MCV 92.2  PLT 638   Basic Metabolic Panel: Recent Labs  Lab 05/02/18 1619  NA 137  K 3.6  CL 105  CO2 24  GLUCOSE 112*  BUN 12  CREATININE 1.13  CALCIUM 9.1   GFR: Estimated Creatinine Clearance: 66 mL/min (by C-G formula based on SCr of 1.13 mg/dL). Liver Function Tests: No results for input(s): AST, ALT, ALKPHOS, BILITOT, PROT, ALBUMIN in the last 168 hours. No results for input(s): LIPASE, AMYLASE in the last 168 hours. No results for input(s): AMMONIA in the last 168 hours. Coagulation Profile: No results for input(s): INR, PROTIME in the last 168 hours. Cardiac Enzymes: No results for input(s): CKTOTAL, CKMB, CKMBINDEX, TROPONINI in the last 168 hours. BNP (last 3 results) No results for input(s): PROBNP in the last 8760 hours. HbA1C: No results for input(s): HGBA1C in the last 72 hours. CBG: No results for input(s): GLUCAP in the last 168 hours. Lipid Profile: No results for input(s): CHOL, HDL, LDLCALC, TRIG, CHOLHDL, LDLDIRECT in the last 72 hours. Thyroid Function Tests: No results for input(s): TSH, T4TOTAL, FREET4, T3FREE, THYROIDAB in the last 72 hours. Anemia Panel: No results for input(s): VITAMINB12, FOLATE, FERRITIN, TIBC, IRON, RETICCTPCT in the last 72 hours. Urine analysis:    Component Value Date/Time   COLORURINE YELLOW 10/20/2017 0824   APPEARANCEUR CLEAR 10/20/2017 0824   LABSPEC 1.011  10/20/2017 0824   PHURINE 6.0 10/20/2017 0824   GLUCOSEU NEGATIVE 10/20/2017 0824   HGBUR SMALL (A) 10/20/2017 0824   BILIRUBINUR NEGATIVE 10/20/2017 0824   KETONESUR NEGATIVE 10/20/2017 0824   PROTEINUR NEGATIVE 10/20/2017 0824   NITRITE NEGATIVE 10/20/2017 0824   LEUKOCYTESUR LARGE (A) 10/20/2017 0824    Radiological Exams on Admission: Dg Chest 2 View  Result Date: 05/02/2018 CLINICAL DATA:  EMS reports from home, increasing SOB x several days, hx of COPD and emphysema. Smoker, HTN EXAM: CHEST - 2 VIEW COMPARISON:  10/18/2017 FINDINGS: The heart size and mediastinal contours are within normal limits. Both lungs are clear. The visualized skeletal structures are unremarkable. IMPRESSION: No active cardiopulmonary disease. Electronically Signed   By: Nolon Nations M.D.   On: 05/02/2018 16:40    EKG: Independently reviewed. NSR, LAD, TWI V2  Assessment/Plan Active Problems:   COPD exacerbation (H[facility])   Respiratory distress   Essential hypertension   # COPD exacerbation - pt with significantly increased shortness of breath and wheezing, though without increased cough or sputum production. CXR unremarkable. Hemodynamically stable. EKG w/ TWI v2. Does complain of some mild chest pressure. Also reports  leg swelling recently,though no signs of DVT on exam today. Severe SOB withambulation is main indication for admission. Not hypoxic at rest. - copd treatment with duonebs, continue home laba/steroid, daily prednisone, and levofloxacin - wells score is low risk, will check d-dimer, CTA if elevated for age - given chest pressure and not clear-cut signs of copd exacerbation also checking troponin - monitor o2, supplemental oxygen if needed - f/u flu panel - if not already received, consider influenza and pneumococcal vaccination prior to d/c - smoking cessation counseling  **Addendum 21:40 - D dimer elevated, order CTA chest PE protocol  # HTN - here bp mildly elevated - ctm, resume home  meds  # tobacco abuse - nicotine patch  # health care maintenance - f/u hiv, hcv  DVT prophylaxis: lovenox Code Status: full  Family Communication: daughter tiffany Castrejon [phone]  Disposition Plan: tbd  Consults called: none  Admission status: med/surg    Desma Maxim MD [facility] Pager [phone]  If 7PM-7AM, please contact night-coverage [email or web address] Password [facility]  05/02/2018, 8:35 PM

## 2018-05-02 NOTE — ED Triage Notes (Signed)
EMS reports from home, increasing SOB x several days, hx of COPD and emphysema.  BP 140/100 HR 100 RR 24 Sp02 100 8 ltrs  10mg  Albuterol 1mg  Atrovent 125mg  Solumedrol enroute  18 LAC

## 2018-05-02 NOTE — ED Notes (Addendum)
Patient at 87% attempting to use urinal. Unable to ambulate further. Audible wheezing. EDP notified.

## 2018-05-02 NOTE — Progress Notes (Signed)
Pharmacy Antibiotic Note  Gregory Christensen is a 67 y.o. male admitted on 05/02/2018 with severe SOB.  Pt with medical history significant for copd,tobacco abuse,hypertension. Pharmacy has been consulted for levaquin dosing for COPD exacerabation.  Plan: levaquin 500mg  IV q24h Follow renal function and clinical course   Height: 6\' 1"  (185.4 cm) Weight: 160 lb (72.6 kg) IBW/kg (Calculated) : 79.9  Temp (24hrs), Avg:97.8 F (36.6 C), Min:97.8 F (36.6 C), Max:97.8 F (36.6 C)  Recent Labs  Lab 05/02/18 1619  WBC 6.7  CREATININE 1.13    Estimated Creatinine Clearance: 66 mL/min (by C-G formula based on SCr of 1.13 mg/dL).    No Known Allergies  Antimicrobials this admission: 11/1 levaquin >> Dose adjustments this admission:   Microbiology results:  Thank you for allowing pharmacy to be a part of this patient's care.  Dolly Rias RPh 05/02/2018, 8:49 PM Pager [phone]

## 2018-05-02 NOTE — ED Notes (Signed)
Patient transported to CT 

## 2018-05-02 NOTE — ED Notes (Signed)
ED TO INPATIENT HANDOFF REPORT  Name/Age/Gender Gregory Christensen 67 y.o. male  Code Status Code Status History    Date Active Date Inactive Code Status Order ID Comments User Context   10/18/2017 1932 10/24/2017 1449 Full Code [id number]  Kayleen Memos, DO Inpatient      Home/SNF/Other Home  Chief Complaint Respiratory Distress  Level of Care/Admitting Diagnosis ED Disposition    ED Disposition Condition [facility] Area: [facility] [[id number]]  Level of Care: Med-Surg [16]  Diagnosis: COPD exacerbation [facility]) [[id number]]  Admitting Physician: Eston Esters  Attending Physician: Eston Esters  Estimated length of stay: past midnight tomorrow  Certification:: I certify this patient will need inpatient services for at least 2 midnights  PT Class (Do Not Modify): Inpatient [101]  PT Acc Code (Do Not Modify): Private [1]       Medical History Past Medical History:  Diagnosis Date  . Arthritis   . Chronic back pain   . Chronic hip pain   . COPD (chronic obstructive pulmonary disease) ([place])   . Emphysema lung ([place])     Allergies No Known Allergies  IV Location/Drains/Wounds Patient Lines/Drains/Airways Status   Active Line/Drains/Airways    Name:   Placement date:   Placement time:   Site:   Days:   Peripheral IV 05/02/18 Left Antecubital   05/02/18    1558    Antecubital   less than 1          Labs/Imaging Results for orders placed or performed during the hospital encounter of 05/02/18 (from the past 48 hour(s))  Basic metabolic panel     Status: Abnormal   Collection Time: 05/02/18  4:19 PM  Result Value Ref Range   Sodium 137 135 - 145 mmol/L   Potassium 3.6 3.5 - 5.1 mmol/L   Chloride 105 98 - 111 mmol/L   CO2 24 22 - 32 mmol/L   Glucose, Bld 112 (H) 70 - 99 mg/dL   BUN 12 8 - 23 mg/dL   Creatinine, Ser 1.13 0.61 - 1.24 mg/dL   Calcium 9.1 8.9 - 10.3 mg/dL   GFR calc non Af Amer >60 >60 mL/min   GFR calc Af Amer >60 >60 mL/min    Comment: (NOTE) The eGFR has been calculated using the CKD EPI equation. This calculation has not been validated in all clinical situations. eGFR's persistently <60 mL/min signify possible Chronic Kidney Disease.    Anion gap 8 5 - 15    Comment: Performed at [facility], [place] [address] [place], [place] [address]  CBC with Differential/Platelet     Status: None   Collection Time: 05/02/18  4:19 PM  Result Value Ref Range   WBC 6.7 4.0 - 10.5 K/uL   RBC 4.85 4.22 - 5.81 MIL/uL   Hemoglobin 14.1 13.0 - 17.0 g/dL   HCT 44.7 39.0 - 52.0 %   MCV 92.2 80.0 - 100.0 fL   MCH 29.1 26.0 - 34.0 pg   MCHC 31.5 30.0 - 36.0 g/dL   RDW 14.2 11.5 - 15.5 %   Platelets 190 150 - 400 K/uL   nRBC 0.0 0.0 - 0.2 %   Neutrophils Relative % 67 %   Neutro Abs 4.4 1.7 - 7.7 K/uL   Lymphocytes Relative 21 %   Lymphs Abs 1.4 0.7 - 4.0 K/uL   Monocytes Relative 9 %   Monocytes Absolute 0.6 0.1 - 1.0 K/uL  Eosinophils Relative 3 %   Eosinophils Absolute 0.2 0.0 - 0.5 K/uL   Basophils Relative 0 %   Basophils Absolute 0.0 0.0 - 0.1 K/uL   Immature Granulocytes 0 %   Abs Immature Granulocytes 0.02 0.00 - 0.07 K/uL    Comment: Performed at [facility], [place] [address], [place], [place] [address]   Dg Chest 2 View  Result Date: 05/02/2018 CLINICAL DATA:  EMS reports from home, increasing SOB x several days, hx of COPD and emphysema. Smoker, HTN EXAM: CHEST - 2 VIEW COMPARISON:  10/18/2017 FINDINGS: The heart size and mediastinal contours are within normal limits. Both lungs are clear. The visualized skeletal structures are unremarkable. IMPRESSION: No active cardiopulmonary disease. Electronically Signed   By: Nolon Nations M.D.   On: 05/02/2018 16:40    Pending Labs Unresulted Labs (From admission, onward)    Start     Ordered   05/02/18 2032  Influenza panel by PCR (type A & B)  Once,   R     05/02/18 2031   05/02/18 2025   D-dimer, quantitative (not at [facility])  Add-on,   R     05/02/18 2024   Signed and Held  Troponin I  STAT,   R     Signed and Held   Signed and Held  HIV antibody (Routine Testing)  Once,   R     Signed and Held   Signed and Held  CBC  (enoxaparin (LOVENOX)    CrCl >/= 30 ml/min)  Once,   R    Comments:  Baseline for enoxaparin therapy IF NOT ALREADY DRAWN.  Notify MD if PLT < 100 K.    Signed and Held   Signed and Held  Creatinine, serum  (enoxaparin (LOVENOX)    CrCl >/= 30 ml/min)  Once,   R    Comments:  Baseline for enoxaparin therapy IF NOT ALREADY DRAWN.    Signed and Held   Signed and Held  Creatinine, serum  (enoxaparin (LOVENOX)    CrCl >/= 30 ml/min)  Weekly,   R    Comments:  while on enoxaparin therapy    Signed and Held          Vitals/Pain Today's Vitals   05/02/18 1800 05/02/18 1830 05/02/18 1844 05/02/18 1957  BP: (!) 158/85 (!) 161/94 (!) 161/94   Pulse: 97 99 99   Resp:   20   Temp:      TempSrc:      SpO2: 94% 94% 94%   Weight:      Height:      PainSc:    8     Isolation Precautions No active isolations  Medications Medications  albuterol (PROVENTIL,VENTOLIN) solution continuous neb (10 mg/hr Nebulization Not Given 05/02/18 1654)  magnesium sulfate IVPB 2 g 50 mL (0 g Intravenous Stopped 05/02/18 1725)  albuterol (PROVENTIL, VENTOLIN) (5 MG/ML) 0.5% continuous inhalation solution (  Given 05/02/18 1654)    Mobility walks

## 2018-05-03 LAB — HIV ANTIBODY (ROUTINE TESTING W REFLEX): HIV Screen 4th Generation wRfx: NONREACTIVE

## 2018-05-03 MED ORDER — ALUM & MAG HYDROXIDE-SIMETH 200-200-20 MG/5ML PO SUSP
30.0000 mL | Freq: Four times a day (QID) | ORAL | Status: DC | PRN
  Administered 2018-05-03: 30 mL via ORAL
  Filled 2018-05-03: qty 30

## 2018-05-03 MED ORDER — LIP MEDEX EX OINT
TOPICAL_OINTMENT | CUTANEOUS | Status: DC | PRN
  Administered 2018-05-03: 18:00:00 via TOPICAL

## 2018-05-03 MED ORDER — METHYLPREDNISOLONE SODIUM SUCC 40 MG IJ SOLR
40.0000 mg | Freq: Every day | INTRAMUSCULAR | Status: DC
  Filled 2018-05-03: qty 1

## 2018-05-03 MED ORDER — LIP MEDEX EX OINT
TOPICAL_OINTMENT | CUTANEOUS | Status: AC
  Filled 2018-05-03: qty 7

## 2018-05-03 NOTE — Progress Notes (Signed)
PROGRESS NOTE    Gregory Christensen  HQI:[id number] DOB: 04-24-1951 DOA: 05/02/2018 PCP: Leighton Ruff, MD    Brief Narrative:  67 y.o. male with medical history significant for copd,tobacco abuse,hypertension,presenting with above  Says symptoms began yesterday relatively abruptly (per ED provider patient said symptoms began about a week ago). Says developed relatively significant shortness of breath yesterday, at rest but worse with ambulation. Denies fevers, denies cough or increased sputumproduction. Not on oxygen at home. Continues to smoke 1 ppd; says iscompliant with home meds including inhalers. Does  Have some mild chest pressure in center ofchest, does not radiate. Says had some left calf swelling about a week ago that resolved with baclofen. No hemoptysis. Denies recent antibiotics. Denies drugs; occasional etoh.  In ED after treatment per ED provider pt developed severe sob when got up to ambulate, unable to ambulate a significant distance,with o2 sat dropping to 80s  Assessment & Plan:   Active Problems:   COPD exacerbation ([place])   Respiratory distress   Essential hypertension  # COPD exacerbation - Pt presented with marked wheezing and sob, still remains on supplemental O2 - Remains with marked wheezing on exam and subjective sob over baseline -Will continue on scheduled bronchodilators and will increase steroids to scheduled IV solumedrol daily - continue to wean O2 as tolerated -CTA chest reviewed, findings of emphysema. No PE  # HTN - here bp mildly elevated - Continue home meds as tolerated  # tobacco abuse - Continued on nicotine patch - Cessation done face to face. Patient reports feeling motivated to quit  # health care maintenance -hiv, hcv serologies pending  DVT prophylaxis: Lovenox subQ Code Status: Full Family Communication: Pt in room, family not at bedside Disposition Plan: Possible d/c in 24-48hrs when weaned off O2  Consultants:      Procedures:     Antimicrobials: Anti-infectives (From admission, onward)   Start     Dose/Rate Route Frequency Ordered Stop   05/02/18 2100  levofloxacin (LEVAQUIN) IVPB 500 mg     500 mg 100 mL/hr over 60 Minutes Intravenous Every 24 hours 05/02/18 2051         Subjective: Reports feeling better, however still sob and wheezing  Objective: Vitals:   05/03/18 0448 05/03/18 0724 05/03/18 0727 05/03/18 1426  BP: 118/69   136/76  Pulse: 90   96  Resp: 18   18  Temp: 97.6 F (36.4 C)   97.8 F (36.6 C)  TempSrc: Oral   Oral  SpO2: 98% 98% 98% 100%  Weight:      Height:        Intake/Output Summary (Last 24 hours) at 05/03/2018 1502 Last data filed at 05/03/2018 0800 Gross per 24 hour  Intake 392.48 ml  Output 700 ml  Net -307.52 ml   Filed Weights   05/02/18 1557  Weight: 72.6 kg    Examination:  General exam: Appears calm and comfortable  Respiratory system: Normal resp effort, end-expiratory wheezing Cardiovascular system: S1 & S2 heard, RRR. Gastrointestinal system: Abdomen is nondistended, soft and nontender. No organomegaly or masses felt. Normal bowel sounds heard. Central nervous system: Alert and oriented. No focal neurological deficits. Extremities: Symmetric 5 x 5 power. Skin: No rashes, lesions Psychiatry: Judgement and insight appear normal. Mood & affect appropriate.   Data Reviewed: I have personally reviewed following labs and imaging studies  CBC: Recent Labs  Lab 05/02/18 1619  WBC 6.7  NEUTROABS 4.4  HGB 14.1  HCT 44.7  MCV 92.2  PLT 235   Basic Metabolic Panel: Recent Labs  Lab 05/02/18 1619  NA 137  K 3.6  CL 105  CO2 24  GLUCOSE 112*  BUN 12  CREATININE 1.13  CALCIUM 9.1   GFR: Estimated Creatinine Clearance: 66 mL/min (by C-G formula based on SCr of 1.13 mg/dL). Liver Function Tests: No results for input(s): AST, ALT, ALKPHOS, BILITOT, PROT, ALBUMIN in the last 168 hours. No results for input(s): LIPASE,  AMYLASE in the last 168 hours. No results for input(s): AMMONIA in the last 168 hours. Coagulation Profile: No results for input(s): INR, PROTIME in the last 168 hours. Cardiac Enzymes: Recent Labs  Lab 05/02/18 2117  TROPONINI <0.03   BNP (last 3 results) No results for input(s): PROBNP in the last 8760 hours. HbA1C: No results for input(s): HGBA1C in the last 72 hours. CBG: No results for input(s): GLUCAP in the last 168 hours. Lipid Profile: No results for input(s): CHOL, HDL, LDLCALC, TRIG, CHOLHDL, LDLDIRECT in the last 72 hours. Thyroid Function Tests: No results for input(s): TSH, T4TOTAL, FREET4, T3FREE, THYROIDAB in the last 72 hours. Anemia Panel: No results for input(s): VITAMINB12, FOLATE, FERRITIN, TIBC, IRON, RETICCTPCT in the last 72 hours. Sepsis Labs: No results for input(s): PROCALCITON, LATICACIDVEN in the last 168 hours.  No results found for this or any previous visit (from the past 240 hour(s)).   Radiology Studies: Dg Chest 2 View  Result Date: 05/02/2018 CLINICAL DATA:  EMS reports from home, increasing SOB x several days, hx of COPD and emphysema. Smoker, HTN EXAM: CHEST - 2 VIEW COMPARISON:  10/18/2017 FINDINGS: The heart size and mediastinal contours are within normal limits. Both lungs are clear. The visualized skeletal structures are unremarkable. IMPRESSION: No active cardiopulmonary disease. Electronically Signed   By: Nolon Nations M.D.   On: 05/02/2018 16:40   Ct Angio Chest Pe W Or Wo Contrast  Result Date: 05/02/2018 CLINICAL DATA:  67 y/o  M; shortness of breath and elevated D-dimer. EXAM: CT ANGIOGRAPHY CHEST WITH CONTRAST TECHNIQUE: Multidetector CT imaging of the chest was performed using the standard protocol during bolus administration of intravenous contrast. Multiplanar CT image reconstructions and MIPs were obtained to evaluate the vascular anatomy. CONTRAST:  140mL ISOVUE-370 IOPAMIDOL (ISOVUE-370) INJECTION 76% COMPARISON:  10/18/2017  CTA chest. FINDINGS: Cardiovascular: Satisfactory opacification of the pulmonary arteries to the segmental level. No evidence of pulmonary embolism. Normal heart size. No pericardial effusion. Mild aortic and coronary artery calcific atherosclerosis. Mediastinum/Nodes: No enlarged mediastinal, hilar, or axillary lymph nodes. Thyroid gland, trachea, and esophagus demonstrate no significant findings. Lungs/Pleura: Lungs are clear. No pleural effusion or pneumothorax. Moderate to severe emphysema of the lungs. Upper Abdomen: No acute abnormality. Musculoskeletal: No chest wall abnormality. No acute or significant osseous findings. Multiple chronic left anterior rib fractures. Review of the MIP images confirms the above findings. IMPRESSION: 1. No pulmonary embolus identified.  No acute pulmonary process. 2. Moderate to severe emphysema of the lungs. 3. Mild aortic and coronary artery calcific atherosclerosis. Aortic Atherosclerosis (ICD10-I70.0) and Emphysema (ICD10-J43.9). Electronically Signed   By: Kristine Garbe M.D.   On: 05/02/2018 22:04    Scheduled Meds: . amLODipine  10 mg Oral Daily  . enoxaparin (LOVENOX) injection  40 mg Subcutaneous QHS  . fluticasone  1 spray Each Nare BID  . gabapentin  300 mg Oral BID  . ipratropium-albuterol  3 mL Nebulization Q6H  . loratadine  10 mg Oral Daily  . mometasone-formoterol  2 puff Inhalation BID  .  montelukast  10 mg Oral QHS  . nicotine  21 mg Transdermal Daily  . predniSONE  40 mg Oral Q breakfast  . sodium chloride flush  3 mL Intravenous Q12H  . tamsulosin  0.4 mg Oral QPC supper   Continuous Infusions: . sodium chloride Stopped (05/03/18 0031)  . levofloxacin (LEVAQUIN) IV Stopped (05/03/18 0017)     LOS: 1 day   Marylu Lund, MD [facility] Pager On Amion  If 7PM-7AM, please contact night-coverage 05/03/2018, 3:02 PM

## 2018-05-04 DIAGNOSIS — F17200 Nicotine dependence, unspecified, uncomplicated: Secondary | ICD-10-CM

## 2018-05-04 DIAGNOSIS — J9621 Acute and chronic respiratory failure with hypoxia: Secondary | ICD-10-CM

## 2018-05-04 LAB — HCV INTERPRETATION

## 2018-05-04 LAB — HCV AB W REFLEX TO QUANT PCR: HCV Ab: <0.1 s/co ratio (ref 0.0–0.9)

## 2018-05-04 MED ORDER — ALBUTEROL SULFATE HFA 108 (90 BASE) MCG/ACT IN AERS: 2.0000 | INHALATION_SPRAY | RESPIRATORY_TRACT | 2 refills | Status: DC | PRN

## 2018-05-04 MED ORDER — PREDNISONE 10 MG PO TABS: 10.0000 mg | ORAL_TABLET | Freq: Every day | ORAL | 0 refills | Status: DC

## 2018-05-04 MED ORDER — AZITHROMYCIN 250 MG PO TABS: ORAL_TABLET | ORAL | 0 refills | Status: DC

## 2018-05-04 NOTE — Discharge Summary (Signed)
Physician Discharge Summary  [name] WEX:[id number] DOB: [birth year] DOA: 05/02/2018  PCP: Leighton Ruff, MD  Admit date: 05/02/2018 Discharge date: 05/04/2018  Time spent: 45 minutes  Recommendations for Outpatient Follow-up:  -To be discharged home today. -We will have oxygen assessment to see if he qualifies for home oxygen, if he does we will arrange prior to discharge. -Advised to follow-up with PCP in 2 weeks. -We will complete a course of a azithromycin and a prednisone taper for his COPD exacerbation.  Discharge Diagnoses:  Active Problems:   COPD exacerbation ([place])   Respiratory distress   Essential hypertension   Discharge Condition: Stable and improved  Filed Weights   05/02/18 1557  Weight: 72.6 kg    History of present illness:  As per Dr. Si Raider on 11/1: Gregory Christensen is a 67 y.o. male with medical history significant for copd,tobacco abuse,hypertension,presenting with above  Says symptoms began yesterday relatively abruptly (per ED provider patient said symptoms began about a week ago). Says developed relatively significant shortness of breath yesterday, at rest but worse with ambulation. Denies fevers, denies cough or increased sputumproduction. Not on oxygen at home. Continues to smoke 1 ppd; says iscompliant with home meds including inhalers. Does  Have some mild chest pressure in center ofchest, does not radiate. Says had some left calf swelling about a week ago that resolved with baclofen. No hemoptysis. Denies recent antibiotics. Denies drugs; occasional etoh.  In ED after treatment per ED provider pt developed severe sob when got up to ambulate, unable to ambulate a significant distance,with o2 sat dropping to 80s.  Hospital Course:   Acute hypoxemic respiratory failure -Due to COPD with acute exacerbation. -He has clinically improved, he states he is back at his baseline. -Will continue home Spiriva and Symbicort. -Will continue course of  azithromycin for 5 days and a prednisone taper. -Have continued to encourage tobacco cessation. -Will perform oxygen assessment prior to discharge to see if he warrants home oxygen, so we will arrange.  Tobacco abuse -I have discussed tobacco cessation with the patient.  I have counseled the patient regarding the negative impacts of continued tobacco use including but not limited to lung cancer, COPD, and cardiovascular disease.  I have discussed alternatives to tobacco and modalities that may help facilitate tobacco cessation including but not limited to biofeedback, hypnosis, and medications.  Total time spent with tobacco counseling was 4 minutes.    Procedures:  None  Consultations:  None  Discharge Instructions  Discharge Instructions    Diet - low sodium heart healthy   Complete by:  As directed    Increase activity slowly   Complete by:  As directed      Allergies as of 05/04/2018   No Known Allergies     Medication List    TAKE these medications   acetaminophen 500 MG tablet Commonly known as:  TYLENOL Take 1,000 mg by mouth every 6 (six) hours as needed for moderate pain or headache.   albuterol 108 (90 Base) MCG/ACT inhaler Commonly known as:  PROVENTIL HFA;VENTOLIN HFA Inhale 2 puffs into the lungs every 2 (two) hours as needed for wheezing or shortness of breath. What changed:  when to take this   amLODipine 10 MG tablet Commonly known as:  NORVASC Take 1 tablet (10 mg total) by mouth daily.   azithromycin 250 MG tablet Commonly known as:  ZITHROMAX Use one tablet daily.   budesonide-formoterol 160-4.5 MCG/ACT inhaler Commonly known as:  SYMBICORT Inhale 1  puff into the lungs 2 (two) times daily.   cetirizine 10 MG tablet Commonly known as:  ZYRTEC Take 10 mg by mouth daily.   fluticasone 50 MCG/ACT nasal spray Commonly known as:  FLONASE Place 1 spray into both nostrils 2 (two) times daily.   gabapentin 300 MG capsule Commonly known as:   NEURONTIN Take 300 mg by mouth 2 (two) times daily.   methocarbamol 500 MG tablet Commonly known as:  ROBAXIN Take 500 mg by mouth at bedtime as needed for muscle spasms.   montelukast 10 MG tablet Commonly known as:  SINGULAIR Take 1 tablet (10 mg total) by mouth at bedtime.   predniSONE 10 MG tablet Commonly known as:  DELTASONE Take 1 tablet (10 mg total) by mouth daily with breakfast. Take 6 tablets today and then decrease by 1 tablet daily until none are left.   SPIRIVA RESPIMAT 1.25 MCG/ACT Aers Generic drug:  Tiotropium Bromide Monohydrate Inhale 2 puffs into the lungs daily.   tamsulosin 0.4 MG Caps capsule Commonly known as:  FLOMAX Take 0.4 mg by mouth daily after supper.      No Known Allergies Follow-up Information    Leighton Ruff, MD. Schedule an appointment as soon as possible for a visit in 2 week(s).   Specialty:  Family Medicine Contact information: [address] [phone]            The results of significant diagnostics from this hospitalization (including imaging, microbiology, ancillary and laboratory) are listed below for reference.    Significant Diagnostic Studies: Dg Chest 2 View  Result Date: 05/02/2018 CLINICAL DATA:  EMS reports from home, increasing SOB x several days, hx of COPD and emphysema. Smoker, HTN EXAM: CHEST - 2 VIEW COMPARISON:  10/18/2017 FINDINGS: The heart size and mediastinal contours are within normal limits. Both lungs are clear. The visualized skeletal structures are unremarkable. IMPRESSION: No active cardiopulmonary disease. Electronically Signed   By: Nolon Nations M.D.   On: 05/02/2018 16:40   Ct Angio Chest Pe W Or Wo Contrast  Result Date: 05/02/2018 CLINICAL DATA:  67 y/o  M; shortness of breath and elevated D-dimer. EXAM: CT ANGIOGRAPHY CHEST WITH CONTRAST TECHNIQUE: Multidetector CT imaging of the chest was performed using the standard protocol during bolus administration of  intravenous contrast. Multiplanar CT image reconstructions and MIPs were obtained to evaluate the vascular anatomy. CONTRAST:  138mL ISOVUE-370 IOPAMIDOL (ISOVUE-370) INJECTION 76% COMPARISON:  10/18/2017 CTA chest. FINDINGS: Cardiovascular: Satisfactory opacification of the pulmonary arteries to the segmental level. No evidence of pulmonary embolism. Normal heart size. No pericardial effusion. Mild aortic and coronary artery calcific atherosclerosis. Mediastinum/Nodes: No enlarged mediastinal, hilar, or axillary lymph nodes. Thyroid gland, trachea, and esophagus demonstrate no significant findings. Lungs/Pleura: Lungs are clear. No pleural effusion or pneumothorax. Moderate to severe emphysema of the lungs. Upper Abdomen: No acute abnormality. Musculoskeletal: No chest wall abnormality. No acute or significant osseous findings. Multiple chronic left anterior rib fractures. Review of the MIP images confirms the above findings. IMPRESSION: 1. No pulmonary embolus identified.  No acute pulmonary process. 2. Moderate to severe emphysema of the lungs. 3. Mild aortic and coronary artery calcific atherosclerosis. Aortic Atherosclerosis (ICD10-I70.0) and Emphysema (ICD10-J43.9). Electronically Signed   By: Kristine Garbe M.D.   On: 05/02/2018 22:04    Microbiology: No results found for this or any previous visit (from the past 240 hour(s)).   Labs: Basic Metabolic Panel: Recent Labs  Lab 05/02/18 1619  NA 137  K 3.6  CL 105  CO2 24  GLUCOSE 112*  BUN 12  CREATININE 1.13  CALCIUM 9.1   Liver Function Tests: No results for input(s): AST, ALT, ALKPHOS, BILITOT, PROT, ALBUMIN in the last 168 hours. No results for input(s): LIPASE, AMYLASE in the last 168 hours. No results for input(s): AMMONIA in the last 168 hours. CBC: Recent Labs  Lab 05/02/18 1619  WBC 6.7  NEUTROABS 4.4  HGB 14.1  HCT 44.7  MCV 92.2  PLT 190   Cardiac Enzymes: Recent Labs  Lab 05/02/18 2117  TROPONINI <0.03    BNP: BNP (last 3 results) No results for input(s): BNP in the last 8760 hours.  ProBNP (last 3 results) No results for input(s): PROBNP in the last 8760 hours.  CBG: No results for input(s): GLUCAP in the last 168 hours.     Signed:  Lelon Frohlich  [facility] Pager: [phone] 05/04/2018, 11:02 AM

## 2018-05-04 NOTE — Progress Notes (Signed)
Discharge instructions given. Pt verbalized understanding and all questions were answered.  

## 2018-05-04 NOTE — Progress Notes (Signed)
SATURATION QUALIFICATIONS:   Patient Saturations on Room Air at Rest = 98%  Patient Saturations on Room Air while Ambulating = 96% 

## 2018-05-06 DIAGNOSIS — I700 Atherosclerosis of aorta: Secondary | ICD-10-CM | POA: Diagnosis not present

## 2018-05-06 DIAGNOSIS — I10 Essential (primary) hypertension: Secondary | ICD-10-CM | POA: Diagnosis not present

## 2018-05-06 DIAGNOSIS — I2510 Atherosclerotic heart disease of native coronary artery without angina pectoris: Secondary | ICD-10-CM | POA: Diagnosis not present

## 2018-05-06 DIAGNOSIS — Z87891 Personal history of nicotine dependence: Secondary | ICD-10-CM | POA: Diagnosis not present

## 2018-05-06 DIAGNOSIS — J449 Chronic obstructive pulmonary disease, unspecified: Secondary | ICD-10-CM | POA: Diagnosis not present

## 2018-05-31 ENCOUNTER — Other Ambulatory Visit: Payer: Self-pay

## 2018-05-31 ENCOUNTER — Emergency Department (HOSPITAL_COMMUNITY): Payer: Medicare HMO

## 2018-05-31 ENCOUNTER — Encounter (HOSPITAL_COMMUNITY): Payer: Self-pay

## 2018-05-31 ENCOUNTER — Inpatient Hospital Stay (HOSPITAL_COMMUNITY)
Admission: EM | Admit: 2018-05-31 | Discharge: 2018-06-04 | DRG: 175 | Disposition: A | Discharge status: Home Health | Payer: Medicare HMO | Attending: Internal Medicine | Admitting: Internal Medicine

## 2018-05-31 DIAGNOSIS — I2699 Other pulmonary embolism without acute cor pulmonale: Secondary | ICD-10-CM | POA: Diagnosis not present

## 2018-05-31 DIAGNOSIS — N1330 Unspecified hydronephrosis: Secondary | ICD-10-CM | POA: Diagnosis not present

## 2018-05-31 DIAGNOSIS — F17211 Nicotine dependence, cigarettes, in remission: Secondary | ICD-10-CM | POA: Diagnosis present

## 2018-05-31 DIAGNOSIS — Z79899 Other long term (current) drug therapy: Secondary | ICD-10-CM | POA: Diagnosis not present

## 2018-05-31 DIAGNOSIS — J9601 Acute respiratory failure with hypoxia: Secondary | ICD-10-CM | POA: Diagnosis not present

## 2018-05-31 DIAGNOSIS — C61 Malignant neoplasm of prostate: Secondary | ICD-10-CM | POA: Diagnosis present

## 2018-05-31 DIAGNOSIS — I82402 Acute embolism and thrombosis of unspecified deep veins of left lower extremity: Secondary | ICD-10-CM

## 2018-05-31 DIAGNOSIS — N182 Chronic kidney disease, stage 2 (mild): Secondary | ICD-10-CM | POA: Diagnosis present

## 2018-05-31 DIAGNOSIS — I129 Hypertensive chronic kidney disease with stage 1 through stage 4 chronic kidney disease, or unspecified chronic kidney disease: Secondary | ICD-10-CM | POA: Diagnosis present

## 2018-05-31 DIAGNOSIS — I152 Hypertension secondary to endocrine disorders: Secondary | ICD-10-CM | POA: Diagnosis present

## 2018-05-31 DIAGNOSIS — I82432 Acute embolism and thrombosis of left popliteal vein: Secondary | ICD-10-CM | POA: Diagnosis not present

## 2018-05-31 DIAGNOSIS — R0602 Shortness of breath: Secondary | ICD-10-CM | POA: Diagnosis not present

## 2018-05-31 DIAGNOSIS — I10 Essential (primary) hypertension: Secondary | ICD-10-CM | POA: Diagnosis not present

## 2018-05-31 DIAGNOSIS — J431 Panlobular emphysema: Secondary | ICD-10-CM | POA: Diagnosis present

## 2018-05-31 DIAGNOSIS — Z791 Long term (current) use of non-steroidal anti-inflammatories (NSAID): Secondary | ICD-10-CM

## 2018-05-31 DIAGNOSIS — Z6821 Body mass index (BMI) 21.0-21.9, adult: Secondary | ICD-10-CM

## 2018-05-31 DIAGNOSIS — R0902 Hypoxemia: Secondary | ICD-10-CM | POA: Diagnosis present

## 2018-05-31 DIAGNOSIS — Z79890 Hormone replacement therapy (postmenopausal): Secondary | ICD-10-CM | POA: Diagnosis not present

## 2018-05-31 DIAGNOSIS — M545 Low back pain: Secondary | ICD-10-CM | POA: Diagnosis present

## 2018-05-31 DIAGNOSIS — N179 Acute kidney failure, unspecified: Secondary | ICD-10-CM | POA: Diagnosis present

## 2018-05-31 DIAGNOSIS — J441 Chronic obstructive pulmonary disease with (acute) exacerbation: Secondary | ICD-10-CM | POA: Diagnosis not present

## 2018-05-31 DIAGNOSIS — R627 Adult failure to thrive: Secondary | ICD-10-CM | POA: Diagnosis present

## 2018-05-31 DIAGNOSIS — Z7951 Long term (current) use of inhaled steroids: Secondary | ICD-10-CM | POA: Diagnosis not present

## 2018-05-31 DIAGNOSIS — I82442 Acute embolism and thrombosis of left tibial vein: Secondary | ICD-10-CM | POA: Diagnosis not present

## 2018-05-31 DIAGNOSIS — Z66 Do not resuscitate: Secondary | ICD-10-CM | POA: Diagnosis present

## 2018-05-31 DIAGNOSIS — Z716 Tobacco abuse counseling: Secondary | ICD-10-CM | POA: Diagnosis not present

## 2018-05-31 DIAGNOSIS — R079 Chest pain, unspecified: Secondary | ICD-10-CM | POA: Diagnosis not present

## 2018-05-31 DIAGNOSIS — E1159 Type 2 diabetes mellitus with other circulatory complications: Secondary | ICD-10-CM | POA: Diagnosis present

## 2018-05-31 DIAGNOSIS — R0603 Acute respiratory distress: Secondary | ICD-10-CM

## 2018-05-31 DIAGNOSIS — G8929 Other chronic pain: Secondary | ICD-10-CM | POA: Diagnosis present

## 2018-05-31 DIAGNOSIS — I82412 Acute embolism and thrombosis of left femoral vein: Secondary | ICD-10-CM | POA: Diagnosis not present

## 2018-05-31 DIAGNOSIS — R0789 Other chest pain: Secondary | ICD-10-CM | POA: Diagnosis not present

## 2018-05-31 HISTORY — DX: Other pulmonary embolism without acute cor pulmonale: I26.99

## 2018-05-31 LAB — CBC WITH DIFFERENTIAL/PLATELET
ABS IMMATURE GRANULOCYTES: 0.04 10*3/uL (ref 0.00–0.07)
Basophils Absolute: 0.1 10*3/uL (ref 0.0–0.1)
Basophils Relative: 1 %
Eosinophils Absolute: 0.2 10*3/uL (ref 0.0–0.5)
Eosinophils Relative: 3 %
HEMATOCRIT: 44.2 % (ref 39.0–52.0)
HEMOGLOBIN: 13.9 g/dL (ref 13.0–17.0)
Immature Granulocytes: 1 %
LYMPHS PCT: 22 %
Lymphs Abs: 1.7 10*3/uL (ref 0.7–4.0)
MCH: 29.9 pg (ref 26.0–34.0)
MCHC: 31.4 g/dL (ref 30.0–36.0)
MCV: 95.1 fL (ref 80.0–100.0)
MONO ABS: 0.8 10*3/uL (ref 0.1–1.0)
MONOS PCT: 10 %
NEUTROS ABS: 5.1 10*3/uL (ref 1.7–7.7)
Neutrophils Relative %: 63 %
Platelets: 234 10*3/uL (ref 150–400)
RBC: 4.65 MIL/uL (ref 4.22–5.81)
RDW: 13.5 % (ref 11.5–15.5)
WBC: 7.9 10*3/uL (ref 4.0–10.5)
nRBC: 0 % (ref 0.0–0.2)

## 2018-05-31 LAB — COMPREHENSIVE METABOLIC PANEL
ALT: 18 U/L (ref 0–44)
AST: 19 U/L (ref 15–41)
Albumin: 3.8 g/dL (ref 3.5–5.0)
Alkaline Phosphatase: 81 U/L (ref 38–126)
Anion gap: 9 (ref 5–15)
BILIRUBIN TOTAL: 0.7 mg/dL (ref 0.3–1.2)
BUN: 14 mg/dL (ref 8–23)
CHLORIDE: 109 mmol/L (ref 98–111)
CO2: 24 mmol/L (ref 22–32)
CREATININE: 1.13 mg/dL (ref 0.61–1.24)
Calcium: 9.1 mg/dL (ref 8.9–10.3)
GFR calc Af Amer: >60 mL/min (ref >60)
Glucose, Bld: 98 mg/dL (ref 70–99)
Potassium: 4 mmol/L (ref 3.5–5.1)
Sodium: 142 mmol/L (ref 135–145)
TOTAL PROTEIN: 8.2 g/dL — AB (ref 6.5–8.1)

## 2018-05-31 LAB — D-DIMER, QUANTITATIVE (NOT AT ARMC): D DIMER QUANT: 2.62 ug{FEU}/mL — AB (ref 0.00–0.50)

## 2018-05-31 LAB — HEPARIN LEVEL (UNFRACTIONATED): HEPARIN UNFRACTIONATED: 0.69 [IU]/mL (ref 0.30–0.70)

## 2018-05-31 LAB — PROTIME-INR
INR: 1.12
Prothrombin Time: 14.3 seconds (ref 11.4–15.2)

## 2018-05-31 LAB — I-STAT TROPONIN, ED: Troponin i, poc: 0 ng/mL (ref 0.00–0.08)

## 2018-05-31 LAB — APTT: aPTT: 34 seconds (ref 24–36)

## 2018-05-31 MED ORDER — IPRATROPIUM-ALBUTEROL 0.5-2.5 (3) MG/3ML IN SOLN: 3.0000 mL | RESPIRATORY_TRACT | Status: DC | PRN

## 2018-05-31 MED ORDER — GABAPENTIN 300 MG PO CAPS
300.0000 mg | ORAL_CAPSULE | Freq: Two times a day (BID) | ORAL | Status: DC
  Administered 2018-05-31 – 2018-06-04 (×8): 300 mg via ORAL
  Filled 2018-05-31 (×8): qty 1

## 2018-05-31 MED ORDER — TIOTROPIUM BROMIDE MONOHYDRATE 1.25 MCG/ACT IN AERS: 2.0000 | INHALATION_SPRAY | Freq: Every day | RESPIRATORY_TRACT | Status: DC

## 2018-05-31 MED ORDER — FLUTICASONE PROPIONATE 50 MCG/ACT NA SUSP
1.0000 | Freq: Two times a day (BID) | NASAL | Status: DC
  Administered 2018-05-31 – 2018-06-03 (×7): 1 via NASAL
  Filled 2018-05-31: qty 16

## 2018-05-31 MED ORDER — METHYLPREDNISOLONE SODIUM SUCC 125 MG IJ SOLR
125.0000 mg | Freq: Once | INTRAMUSCULAR | Status: AC
  Administered 2018-05-31: 125 mg via INTRAVENOUS
  Filled 2018-05-31: qty 2

## 2018-05-31 MED ORDER — METHOCARBAMOL 500 MG PO TABS: 500.0000 mg | ORAL_TABLET | Freq: Every evening | ORAL | Status: DC | PRN

## 2018-05-31 MED ORDER — ALBUTEROL (5 MG/ML) CONTINUOUS INHALATION SOLN
15.0000 mg/h | INHALATION_SOLUTION | RESPIRATORY_TRACT | Status: DC
  Administered 2018-05-31: 15 mg/h via RESPIRATORY_TRACT
  Filled 2018-05-31: qty 20

## 2018-05-31 MED ORDER — SODIUM CHLORIDE (PF) 0.9 % IJ SOLN
INTRAMUSCULAR | Status: AC
  Filled 2018-05-31: qty 50

## 2018-05-31 MED ORDER — MELATONIN 3 MG PO TABS
3.0000 mg | ORAL_TABLET | Freq: Every day | ORAL | Status: DC
  Administered 2018-05-31 – 2018-06-03 (×4): 3 mg via ORAL
  Filled 2018-05-31 (×4): qty 1

## 2018-05-31 MED ORDER — IOPAMIDOL (ISOVUE-370) INJECTION 76%
100.0000 mL | Freq: Once | INTRAVENOUS | Status: AC | PRN
  Administered 2018-05-31: 100 mL via INTRAVENOUS

## 2018-05-31 MED ORDER — METHYLPREDNISOLONE SODIUM SUCC 125 MG IJ SOLR
125.0000 mg | Freq: Two times a day (BID) | INTRAMUSCULAR | Status: DC
  Administered 2018-05-31 – 2018-06-04 (×8): 125 mg via INTRAVENOUS
  Filled 2018-05-31 (×8): qty 2

## 2018-05-31 MED ORDER — AZITHROMYCIN 250 MG PO TABS
500.0000 mg | ORAL_TABLET | Freq: Every day | ORAL | Status: AC
  Administered 2018-06-01 – 2018-06-04 (×4): 500 mg via ORAL
  Filled 2018-05-31 (×4): qty 2

## 2018-05-31 MED ORDER — LORATADINE 10 MG PO TABS
10.0000 mg | ORAL_TABLET | Freq: Every day | ORAL | Status: DC
  Administered 2018-06-01 – 2018-06-04 (×4): 10 mg via ORAL
  Filled 2018-05-31 (×4): qty 1

## 2018-05-31 MED ORDER — TAMSULOSIN HCL 0.4 MG PO CAPS
0.4000 mg | ORAL_CAPSULE | Freq: Every day | ORAL | Status: DC
  Administered 2018-05-31 – 2018-06-03 (×4): 0.4 mg via ORAL
  Filled 2018-05-31 (×4): qty 1

## 2018-05-31 MED ORDER — HEPARIN BOLUS VIA INFUSION
2300.0000 [IU] | Freq: Once | INTRAVENOUS | Status: AC
  Administered 2018-05-31: 2300 [IU] via INTRAVENOUS
  Filled 2018-05-31: qty 2300

## 2018-05-31 MED ORDER — DICLOFENAC SODIUM 75 MG PO TBEC
75.0000 mg | DELAYED_RELEASE_TABLET | Freq: Two times a day (BID) | ORAL | Status: DC
  Administered 2018-05-31 – 2018-06-02 (×5): 75 mg via ORAL
  Filled 2018-05-31 (×5): qty 1

## 2018-05-31 MED ORDER — UMECLIDINIUM BROMIDE 62.5 MCG/INH IN AEPB
1.0000 | INHALATION_SPRAY | Freq: Every day | RESPIRATORY_TRACT | Status: DC
  Administered 2018-06-01: 1 via RESPIRATORY_TRACT
  Filled 2018-05-31: qty 7

## 2018-05-31 MED ORDER — ACETAMINOPHEN 500 MG PO TABS
1000.0000 mg | ORAL_TABLET | Freq: Four times a day (QID) | ORAL | Status: DC | PRN
  Administered 2018-06-03 – 2018-06-04 (×3): 1000 mg via ORAL
  Filled 2018-05-31 (×3): qty 2

## 2018-05-31 MED ORDER — MONTELUKAST SODIUM 10 MG PO TABS
10.0000 mg | ORAL_TABLET | Freq: Every day | ORAL | Status: DC
  Administered 2018-05-31 – 2018-06-03 (×4): 10 mg via ORAL
  Filled 2018-05-31 (×4): qty 1

## 2018-05-31 MED ORDER — AMLODIPINE BESYLATE 5 MG PO TABS
2.5000 mg | ORAL_TABLET | Freq: Every day | ORAL | Status: DC
  Administered 2018-05-31 – 2018-06-04 (×5): 2.5 mg via ORAL
  Filled 2018-05-31 (×5): qty 1

## 2018-05-31 MED ORDER — SODIUM CHLORIDE 0.9 % IV SOLN
500.0000 mg | INTRAVENOUS | Status: AC
  Administered 2018-05-31: 500 mg via INTRAVENOUS
  Filled 2018-05-31: qty 500

## 2018-05-31 MED ORDER — HEPARIN (PORCINE) 25000 UT/250ML-% IV SOLN
1400.0000 [IU]/h | INTRAVENOUS | Status: DC
  Administered 2018-05-31: 1400 [IU]/h via INTRAVENOUS
  Filled 2018-05-31: qty 250

## 2018-05-31 MED ORDER — IOPAMIDOL (ISOVUE-370) INJECTION 76%
INTRAVENOUS | Status: AC
  Filled 2018-05-31: qty 100

## 2018-05-31 MED ORDER — IPRATROPIUM-ALBUTEROL 0.5-2.5 (3) MG/3ML IN SOLN
3.0000 mL | Freq: Once | RESPIRATORY_TRACT | Status: AC
  Administered 2018-05-31: 3 mL via RESPIRATORY_TRACT
  Filled 2018-05-31: qty 3

## 2018-05-31 NOTE — ED Provider Notes (Signed)
Emergency Department Provider Note   I have reviewed the triage vital signs and the nursing notes.   HISTORY  Chief Complaint Chest Pain   HPI Gregory Christensen is a 67 y.o. male with PMH of COPD and arthritis presents to the emergency department with acute onset right-sided chest pain.  Patient has associated COPD symptoms and has been taking his albuterol inhaler.  Patient woke up shortly before 3 AM with acute onset pain.  He has never had pain like this before.  Patient states he feels like he is having trouble getting a deep breath because of discomfort.  No fevers or chills.  No abdominal pain, nausea, vomiting, diarrhea.  No radiation of symptoms or other modifying factors.  Past Medical History:  Diagnosis Date  . Arthritis   . Chronic back pain   . Chronic hip pain   . COPD (chronic obstructive pulmonary disease) ([place])   . Emphysema lung [facility])     Patient Active Problem List   Diagnosis Date Noted  . COPD exacerbation ([place]) 10/18/2017  . Hypoxia   . Respiratory distress   . Essential hypertension     History reviewed. No pertinent surgical history.   Allergies Patient has no known allergies.  No family history on file.  Social History Social History   Tobacco Use  . Smoking status: Former Smoker    Packs/day: 1.00    Years: 26.00    Pack years: 26.00    Types: Cigarettes    Last attempt to quit: 10/18/2017    Years since quitting: 0.6  . Smokeless tobacco: Never Used  Substance Use Topics  . Alcohol use: Yes  . Drug use: No    Review of Systems  Constitutional: No fever/chills Eyes: No visual changes. ENT: No sore throat. Cardiovascular: Positive chest pain. Respiratory: Positive shortness of breath. Gastrointestinal: No abdominal pain.  No nausea, no vomiting.  No diarrhea.  No constipation. Genitourinary: Negative for dysuria. Musculoskeletal: Negative for back pain. Skin: Negative for rash. Neurological: Negative for headaches, focal  weakness or numbness.  10-point ROS otherwise negative.  ____________________________________________   PHYSICAL EXAM:  VITAL SIGNS: ED Triage Vitals  Enc Vitals Group     BP 05/31/18 0800 136/83     Pulse Rate 05/31/18 0800 94     Resp 05/31/18 0800 20     SpO2 05/31/18 0800 97 %     Weight 05/31/18 0800 160 lb (72.6 kg)     Height 05/31/18 0800 6\' 1"  (1.854 m)     Pain Score 05/31/18 0758 6   Constitutional: Alert and oriented. Well appearing and in no acute distress. Eyes: Conjunctivae are normal.  Head: Atraumatic. Nose: No congestion/rhinnorhea. Mouth/Throat: Mucous membranes are moist.  Oropharynx non-erythematous. Neck: No stridor.   Cardiovascular: Normal rate, regular rhythm. Good peripheral circulation. Grossly normal heart sounds.   Respiratory: Increased respiratory effort.  No retractions. Lungs with bilateral end-expiratory wheezing. Gastrointestinal: Soft and nontender. No distention.  Musculoskeletal: No lower extremity tenderness nor edema. No gross deformities of extremities. Neurologic:  Normal speech and language. No gross focal neurologic deficits are appreciated.  Skin:  Skin is warm, dry and intact. No rash noted.  ____________________________________________   LABS (all labs ordered are listed, but only abnormal results are displayed)  Labs Reviewed  COMPREHENSIVE METABOLIC PANEL - Abnormal; Notable for the following components:      Result Value   Total Protein 8.2 (*)    All other components within normal limits  D-DIMER, QUANTITATIVE (  NOT AT [facility]) - Abnormal; Notable for the following components:   D-Dimer, Quant 2.62 (*)    All other components within normal limits  CBC WITH DIFFERENTIAL/PLATELET  PROTIME-INR  APTT  HEPARIN LEVEL (UNFRACTIONATED)  I-STAT TROPONIN, ED   ____________________________________________  EKG   EKG Interpretation  Date/Time:  Saturday May 31 2018 07:56:15 EST Ventricular Rate:  92 PR Interval:      QRS Duration: 81 QT Interval:  370 QTC Calculation: 458 R Axis:   14 Text Interpretation:  Sinus rhythm Consider right atrial enlargement Borderline repolarization abnormality No STEMI.  Confirmed by Nanda Quinton [phone]) on 05/31/2018 8:32:15 AM      ____________________________________________  RADIOLOGY  Dg Chest 2 View  Result Date: 05/31/2018 CLINICAL DATA:  "sharp" right-sided chest pains at ~0300 hours today. He also tells me he has "COPD" and had a three day hospitalization for COPD at the beginning of this month. He also tells me he had a nebulizer at home "but I'm out of that medication". He states he is not O2 dependent. He is short of breath; but not dyspneic. EXAM: CHEST - 2 VIEW COMPARISON:  none FINDINGS: Lungs are clear. Heart size and mediastinal contours are within normal limits. No effusion.  No pneumothorax. Visualized bones unremarkable. IMPRESSION: No acute cardiopulmonary disease. Electronically Signed   By: Lucrezia Europe M.D.   On: 05/31/2018 09:06   Ct Angio Chest Pe W And/or Wo Contrast  Result Date: 05/31/2018 CLINICAL DATA:  Chest pain EXAM: CT ANGIOGRAPHY CHEST WITH CONTRAST TECHNIQUE: Multidetector CT imaging of the chest was performed using the standard protocol during bolus administration of intravenous contrast. Multiplanar CT image reconstructions and MIPs were obtained to evaluate the vascular anatomy. CONTRAST:  11mL ISOVUE-370 IOPAMIDOL (ISOVUE-370) INJECTION 76% COMPARISON:  05/02/2018 FINDINGS: Cardiovascular: The study is positive for acute pulmonary thromboembolism. There is low-density filling defect extending from the right upper lobe lobar pulmonary artery branch extending into segmental branches. Pulmonary arterial tree is otherwise free of emboli. No evidence of aortic aneurysm. No obvious aortic dissection or acute intramural hematoma. Atherosclerotic vascular calcifications of the aorta and great vessels are noted. Mild 3 vessel coronary artery  calcification. The heart is normal in size. Mediastinum/Nodes: No abnormal mediastinal adenopathy or pericardial effusion. Lungs/Pleura: Severe panlobular emphysema throughout the lungs. Reticulonodular opacities in the anterior right middle lobe and right upper lobe are likely related to scarring. No mass or consolidation. No pneumothorax. No pleural effusion. Upper Abdomen: Mild left hydronephrosis without delayed nephrogram phase is of unknown significance. Musculoskeletal: No vertebral compression deformity. C6-7 degenerative disc disease is partially imaged. Review of the MIP images confirms the above findings. IMPRESSION: The study is positive for lobar and segmental right upper lobe pulmonary thromboembolism. Critical Value/emergent results were called by telephone at the time of interpretation on 05/31/2018 at 10:29 am to Dr. Nanda Quinton , who verbally acknowledged these results. Mild left hydronephrosis of unknown significance. Aortic Atherosclerosis (ICD10-I70.0) and Emphysema (ICD10-J43.9). Electronically Signed   By: Marybelle Killings M.D.   On: 05/31/2018 10:29    ____________________________________________   PROCEDURES  Procedure(s) performed:   .Critical Care Performed by: Margette Fast, MD Authorized by: Margette Fast, MD   Critical care provider statement:    Critical care time (minutes):  35   Critical care time was exclusive of:  Separately billable procedures and treating other patients and teaching time   Critical care was necessary to treat or prevent imminent or life-threatening deterioration of the following conditions:  Respiratory failure   Critical care was time spent personally by me on the following activities:  Blood draw for specimens, development of treatment plan with patient or surrogate, evaluation of patient's response to treatment, examination of patient, obtaining history from patient or surrogate, ordering and performing treatments and interventions, ordering  and review of laboratory studies, ordering and review of radiographic studies, pulse oximetry, re-evaluation of patient's condition and review of old charts   I assumed direction of critical care for this patient from another provider in my specialty: no       ____________________________________________   INITIAL IMPRESSION / [place] / ED COURSE  Pertinent labs & imaging results that were available during my care of the patient were reviewed by me and considered in my medical decision making (see chart for details).  Patient presents to the emergency department for evaluation of acute onset chest pain.  Shortness of breath is near baseline with significant wheezing.  His pain is pleuritic and right-sided.  Admission for PE is somewhat elevated.  I will add on a d-dimer in addition to troponin and obtain a chest x-ray.  Patient will receive a DuoNeb with his wheezing.   10:35 AM Lab work reviewed.  Patient with normal troponin but elevated d-dimer similar to his prior levels.  CT angios obtained with normal chest x-ray which does show acute PE on the right which corresponds with the patient's area of discomfort.  Patient is feeling better after continuous nebulizer.  I feel that most of his shortness of breath is related to COPD exacerbation rather than acute PE although this may be contributing.  Plan for heparin here and admit for further management and evaluation of PE as well as COPD. Wheezing is decreased with neb treatments.   Discussed patient's case with Hospitalist to request admission. Patient and family (if present) updated with plan. Care transferred to Hospitalist service.  I reviewed all nursing notes, vitals, pertinent old records, EKGs, labs, imaging (as available).  ____________________________________________  FINAL CLINICAL IMPRESSION(S) / ED DIAGNOSES  Final diagnoses:  Acute pulmonary embolism without acute cor pulmonale, unspecified pulmonary embolism  type (HCC)  COPD exacerbation ([place])     MEDICATIONS GIVEN DURING THIS VISIT:  Medications  sodium chloride (PF) 0.9 % injection (0 mLs  Hold 05/31/18 1034)  iopamidol (ISOVUE-370) 76 % injection (  Hold 05/31/18 1035)  albuterol (PROVENTIL,VENTOLIN) solution continuous neb (15 mg/hr Nebulization New Bag/Given 05/31/18 1018)  heparin ADULT infusion 100 units/mL (25000 units/228mL sodium chloride 0.45%) (1,400 Units/hr Intravenous New Bag/Given 05/31/18 1102)  ipratropium-albuterol (DUONEB) 0.5-2.5 (3) MG/3ML nebulizer solution 3 mL (3 mLs Nebulization Given 05/31/18 0832)  iopamidol (ISOVUE-370) 76 % injection 100 mL (100 mLs Intravenous Contrast Given 05/31/18 1001)  methylPREDNISolone sodium succinate (SOLU-MEDROL) 125 mg/2 mL injection 125 mg (125 mg Intravenous Given 05/31/18 0957)  heparin bolus via infusion 2,300 Units (2,300 Units Intravenous Bolus from Bag 05/31/18 1103)    Note:  This document was prepared using Dragon voice recognition software and may include unintentional dictation errors.  Nanda Quinton, MD Emergency Medicine    , [facility], MD 05/31/18 1106

## 2018-05-31 NOTE — ED Notes (Signed)
Hospitalist at bedside 

## 2018-05-31 NOTE — Progress Notes (Signed)
ANTICOAGULATION CONSULT NOTE - Follow Up Consult  Pharmacy Consult for Heparin Indication: pulmonary embolus  No Known Allergies  Patient Measurements: Height: 6\' 1"  (185.4 cm) Weight: 160 lb (72.6 kg) IBW/kg (Calculated) : 79.9 Heparin Dosing Weight: TBW  Vital Signs: Temp: 98.2 F (36.8 C) (11/30 1244) Temp Source: Oral (11/30 1244) BP: 156/80 (11/30 1244) Pulse Rate: 100 (11/30 1244)  Labs: Recent Labs    05/31/18 0827  HGB 13.9  HCT 44.2  PLT 234  APTT 34  LABPROT 14.3  INR 1.12  CREATININE 1.13    Estimated Creatinine Clearance: 66 mL/min (by C-G formula based on SCr of 1.13 mg/dL).   Medications:  Infusions:  . heparin 1,400 Units/hr (05/31/18 1102)    Assessment: Gregory Christensen admitted on 11/30 with acute onset of right-sided chest pain 05/31/18. CTA revealed PE.  PMH of COPD, arthritis, of note recent CTA (05/02/18) did not show any evidence of PE.  No PTA anticoagulation.  Pharmacy is consulted to dose Heparin IV.  Today, 05/31/2018: Baseline APTT and INR WNL First heparin level 0.69, therapeutic on heparin at 1400 units/hr. CBC:  Hgb 13.9, Plt 234 No bleeding or complications reported.  Goal of Therapy:  Heparin level 0.3-0.7 units/ml Monitor platelets by anticoagulation protocol: Yes   Plan:   Continue heparin IV infusion at 1400 units/hr  Heparin level in 6 hours to confirm therapeutic rate.    Daily heparin level and CBC when stable.  [name] PharmD, BCPS Pager [phone] 05/31/2018 5:16 PM

## 2018-05-31 NOTE — ED Notes (Signed)
ED TO INPATIENT HANDOFF REPORT  Name/Age/Gender Gregory Christensen 67 y.o. male  Code Status Code Status History    Date Active Date Inactive Code Status Order ID Comments User Context   05/02/2018 2211 05/04/2018 1735 Full Code [id number]  Gwynne Edinger, MD Inpatient   10/18/2017 1932 10/24/2017 1449 Full Code [id number]  Kayleen Memos, DO Inpatient    Advance Directive Documentation     Most Recent Value  Type of Advance Directive  Healthcare Power of Attorney  Pre-existing out of facility DNR order (yellow form or pink MOST form)  -  "MOST" Form in Place?  -      Home/SNF/Other Home  Chief Complaint chest pain/shob  Level of Care/Admitting Diagnosis ED Disposition    ED Disposition Condition [place]: [place] [[id number]]  Level of Care: Telemetry [5]  Admit to tele based on following criteria: Other see comments  Comments: Pulmonary embolism  Diagnosis: Pulmonary embolism [facility]) [[id number]]  Admitting Physician: Astrid Drafts  Attending Physician: Cristal Deer [[id number]]  Estimated length of stay: 3 - 4 days  Certification:: I certify this patient will need inpatient services for at least 2 midnights  PT Class (Do Not Modify): Inpatient [101]  PT Acc Code (Do Not Modify): Private [1]       Medical History Past Medical History:  Diagnosis Date  . Arthritis   . Chronic back pain   . Chronic hip pain   . COPD (chronic obstructive pulmonary disease) ([place])   . Emphysema lung ([place])     Allergies No Known Allergies  IV Location/Drains/Wounds Patient Lines/Drains/Airways Status   Active Line/Drains/Airways    Name:   Placement date:   Placement time:   Site:   Days:   Peripheral IV 05/31/18 Right Forearm   05/31/18    0942    Forearm   less than 1   Peripheral IV 05/31/18 Right;Upper Arm   05/31/18    1055    Arm   less than 1          Labs/Imaging Results for orders placed or performed during the hospital  encounter of 05/31/18 (from the past 48 hour(s))  Comprehensive metabolic panel     Status: Abnormal   Collection Time: 05/31/18  8:27 AM  Result Value Ref Range   Sodium 142 135 - 145 mmol/L   Potassium 4.0 3.5 - 5.1 mmol/L   Chloride 109 98 - 111 mmol/L   CO2 24 22 - 32 mmol/L   Glucose, Bld 98 70 - 99 mg/dL   BUN 14 8 - 23 mg/dL   Creatinine, Ser 1.13 0.61 - 1.24 mg/dL   Calcium 9.1 8.9 - 10.3 mg/dL   Total Protein 8.2 (H) 6.5 - 8.1 g/dL   Albumin 3.8 3.5 - 5.0 g/dL   AST 19 15 - 41 U/L   ALT 18 0 - 44 U/L   Alkaline Phosphatase 81 38 - 126 U/L   Total Bilirubin 0.7 0.3 - 1.2 mg/dL   GFR calc non Af Amer >60 >60 mL/min   GFR calc Af Amer >60 >60 mL/min   Anion gap 9 5 - 15    Comment: Performed at [facility], [place] [address] [place], [place] [address]  CBC with Differential     Status: None   Collection Time: 05/31/18  8:27 AM  Result Value Ref Range   WBC 7.9 4.0 - 10.5 K/uL   RBC 4.65 4.22 -  5.81 MIL/uL   Hemoglobin 13.9 13.0 - 17.0 g/dL   HCT 44.2 39.0 - 52.0 %   MCV 95.1 80.0 - 100.0 fL   MCH 29.9 26.0 - 34.0 pg   MCHC 31.4 30.0 - 36.0 g/dL   RDW 13.5 11.5 - 15.5 %   Platelets 234 150 - 400 K/uL   nRBC 0.0 0.0 - 0.2 %   Neutrophils Relative % 63 %   Neutro Abs 5.1 1.7 - 7.7 K/uL   Lymphocytes Relative 22 %   Lymphs Abs 1.7 0.7 - 4.0 K/uL   Monocytes Relative 10 %   Monocytes Absolute 0.8 0.1 - 1.0 K/uL   Eosinophils Relative 3 %   Eosinophils Absolute 0.2 0.0 - 0.5 K/uL   Basophils Relative 1 %   Basophils Absolute 0.1 0.0 - 0.1 K/uL   Immature Granulocytes 1 %   Abs Immature Granulocytes 0.04 0.00 - 0.07 K/uL    Comment: Performed at [facility], [place] [address], [place], [place] [address]  D-dimer, quantitative     Status: Abnormal   Collection Time: 05/31/18  8:27 AM  Result Value Ref Range   D-Dimer, Quant 2.62 (H) 0.00 - 0.50 ug/mL-FEU    Comment: (NOTE) At the manufacturer cut-off of 0.50 ug/mL FEU, this assay  has been documented to exclude PE with a sensitivity and negative predictive value of 97 to 99%.  At this time, this assay has not been approved by the FDA to exclude DVT/VTE. Results should be correlated with clinical presentation. Performed at [facility], [place] [address], [place], [place] [address]   Protime-INR     Status: None   Collection Time: 05/31/18  8:27 AM  Result Value Ref Range   Prothrombin Time 14.3 11.4 - 15.2 seconds   INR 1.12     Comment: Performed at [facility], [place] [address] [place], [place] [address]  APTT     Status: None   Collection Time: 05/31/18  8:27 AM  Result Value Ref Range   aPTT 34 24 - 36 seconds    Comment: Performed at [facility], [place] [address], [place], [place] [address]  I-stat troponin, ED     Status: None   Collection Time: 05/31/18  8:29 AM  Result Value Ref Range   Troponin i, poc 0.00 0.00 - 0.08 ng/mL   Comment 3            Comment: Due to the release kinetics of cTnI, a negative result within the first hours of the onset of symptoms does not rule out myocardial infarction with certainty. If myocardial infarction is still suspected, repeat the test at appropriate intervals.    Dg Chest 2 View  Result Date: 05/31/2018 CLINICAL DATA:  "sharp" right-sided chest pains at ~0300 hours today. He also tells me he has "COPD" and had a three day hospitalization for COPD at the beginning of this month. He also tells me he had a nebulizer at home "but I'm out of that medication". He states he is not O2 dependent. He is short of breath; but not dyspneic. EXAM: CHEST - 2 VIEW COMPARISON:  none FINDINGS: Lungs are clear. Heart size and mediastinal contours are within normal limits. No effusion.  No pneumothorax. Visualized bones unremarkable. IMPRESSION: No acute cardiopulmonary disease. Electronically Signed   By: Lucrezia Europe M.D.   On: 05/31/2018 09:06   Ct Angio Chest Pe W And/or Wo  Contrast  Result Date: 05/31/2018  CLINICAL DATA:  Chest pain EXAM: CT ANGIOGRAPHY CHEST WITH CONTRAST TECHNIQUE: Multidetector CT imaging of the chest was performed using the standard protocol during bolus administration of intravenous contrast. Multiplanar CT image reconstructions and MIPs were obtained to evaluate the vascular anatomy. CONTRAST:  13mL ISOVUE-370 IOPAMIDOL (ISOVUE-370) INJECTION 76% COMPARISON:  05/02/2018 FINDINGS: Cardiovascular: The study is positive for acute pulmonary thromboembolism. There is low-density filling defect extending from the right upper lobe lobar pulmonary artery branch extending into segmental branches. Pulmonary arterial tree is otherwise free of emboli. No evidence of aortic aneurysm. No obvious aortic dissection or acute intramural hematoma. Atherosclerotic vascular calcifications of the aorta and great vessels are noted. Mild 3 vessel coronary artery calcification. The heart is normal in size. Mediastinum/Nodes: No abnormal mediastinal adenopathy or pericardial effusion. Lungs/Pleura: Severe panlobular emphysema throughout the lungs. Reticulonodular opacities in the anterior right middle lobe and right upper lobe are likely related to scarring. No mass or consolidation. No pneumothorax. No pleural effusion. Upper Abdomen: Mild left hydronephrosis without delayed nephrogram phase is of unknown significance. Musculoskeletal: No vertebral compression deformity. C6-7 degenerative disc disease is partially imaged. Review of the MIP images confirms the above findings. IMPRESSION: The study is positive for lobar and segmental right upper lobe pulmonary thromboembolism. Critical Value/emergent results were called by telephone at the time of interpretation on 05/31/2018 at 10:29 am to Dr. Nanda Quinton , who verbally acknowledged these results. Mild left hydronephrosis of unknown significance. Aortic Atherosclerosis (ICD10-I70.0) and Emphysema (ICD10-J43.9). Electronically Signed    By: Marybelle Killings M.D.   On: 05/31/2018 10:29   EKG Interpretation  Date/Time:  Saturday May 31 2018 07:56:15 EST Ventricular Rate:  92 PR Interval:    QRS Duration: 81 QT Interval:  370 QTC Calculation: 458 R Axis:   14 Text Interpretation:  Sinus rhythm Consider right atrial enlargement Borderline repolarization abnormality No STEMI.  Confirmed by Nanda Quinton [phone] on 05/31/2018 8:32:15 AM   Pending Labs Unresulted Labs (From admission, onward)    Start     Ordered   05/31/18 1700  Heparin level (unfractionated)  Once-Timed,   R     05/31/18 1043   Signed and Held  HIV antibody  Once,   R     Signed and Held   Signed and Held  Basic metabolic panel  Tomorrow morning,   R     Signed and Held   Signed and Held  CBC  Tomorrow morning,   R     Signed and Held          Vitals/Pain Today's Vitals   05/31/18 1049 05/31/18 1100 05/31/18 1130 05/31/18 1132  BP: 138/83 140/87 (!) 147/78 (!) 147/78  Pulse: (!) 101 (!) 103 (!) 101 (!) 103  Resp: (!) 22 (!) 26 16 (!) 23  SpO2: 100% 100% 100% 100%  Weight:      Height:      PainSc:        Isolation Precautions No active isolations  Medications Medications  sodium chloride (PF) 0.9 % injection (0 mLs  Hold 05/31/18 1034)  iopamidol (ISOVUE-370) 76 % injection (  Hold 05/31/18 1035)  albuterol (PROVENTIL,VENTOLIN) solution continuous neb (15 mg/hr Nebulization New Bag/Given 05/31/18 1018)  heparin ADULT infusion 100 units/mL (25000 units/292mL sodium chloride 0.45%) (1,400 Units/hr Intravenous New Bag/Given 05/31/18 1102)  ipratropium-albuterol (DUONEB) 0.5-2.5 (3) MG/3ML nebulizer solution 3 mL (3 mLs Nebulization Given 05/31/18 0832)  iopamidol (ISOVUE-370) 76 % injection 100 mL (100 mLs Intravenous Contrast Given 05/31/18 1001)  methylPREDNISolone sodium succinate (SOLU-MEDROL) 125 mg/2 mL injection 125 mg (125 mg Intravenous Given 05/31/18 0957)  heparin bolus via infusion 2,300 Units (2,300 Units Intravenous Bolus  from Bag 05/31/18 1103)    Mobility Walks

## 2018-05-31 NOTE — ED Notes (Signed)
Patient has verbally stated and confirmed with Hospitalist at bedside that patient is a DO NOT RESUSCITATE.

## 2018-05-31 NOTE — H&P (Signed)
History and Physical  Gregory Christensen XTK:[id number] DOB: Mar 11, 1951 DOA: 05/31/2018  Referring physician: Dr. Laverta Baltimore PCP: Leighton Ruff, MD  Outpatient Specialists: None Patient coming from: Home & is able to ambulate yes  Chief Complaint: Chest pain  HPI: Gregory Christensen is a 67 y.o. male with medical history significant for COPD not on home O2, former 26-year pack history who recently quit tobacco use, arthritis chronic low back pain who presented to the emergency room this morning because he stated that about 3 AM he was woken up by a very sharp pain to the right side he has never had a pain like that before he has some shortness of breath and he had quite some wheezing.  He stated that he has a COPD and he has been using his inhaler regularly he denies any fever or chills no abdominal pain nausea vomiting diarrhea.  Chest  pain is present when he takes a deep breath.  His last admission to the emergency room was May 02, 2018 when he presented with shortness of breath a CT scan then was negative for PE even though his d-dimer was 2.8   ED Course: In the emergency room he was given continuous nebulizer treatment for 3 hours.  IV Solu-Medrol was also started.  D-dimer was positive at 6 2 but is noted to be less than it was when he was last seen 3 weeks ago May 02, 2018 it was 2.8 and CT scan was negative for PE troponin I was less than 0.  03  Review of Systems:  Pt complains of right-sided chest pain wheezing coughing shortness of breath   Pt denies any fever or chills no abdominal pain nausea vomiting or diarrhea no dysuria..  Review of systems are otherwise negative   Past Medical History:  Diagnosis Date  . Arthritis   . Chronic back pain   . Chronic hip pain   . COPD (chronic obstructive pulmonary disease) ([place])   . Emphysema lung ([place])    History reviewed. No pertinent surgical history.  Social History:  reports that he quit smoking about 7 months ago. His smoking use  included cigarettes. He has a 26.00 pack-year smoking history. He has never used smokeless tobacco. He reports that he drinks alcohol. He reports that he does not use drugs.   No Known Allergies  No family history on file.    Prior to Admission medications   Medication Sig Start Date End Date Taking? Authorizing Provider  acetaminophen (TYLENOL) 500 MG tablet Take 1,000 mg by mouth every 6 (six) hours as needed for moderate pain or headache.   Yes [provider]  albuterol (PROVENTIL HFA;VENTOLIN HFA) 108 (90 Base) MCG/ACT inhaler Inhale 2 puffs into the lungs every 2 (two) hours as needed for wheezing or shortness of breath. 05/04/18  Yes Isaac Bliss, Rayford Halsted, MD  Albuterol Sulfate (PROAIR HFA IN) Inhale 2 puffs into the lungs every 4 (four) hours as needed (wheezing and SOB).   Yes [provider]  amLODipine (NORVASC) 2.5 MG tablet Take 2.5 mg by mouth daily.   Yes [provider]  budesonide-formoterol (SYMBICORT) 160-4.5 MCG/ACT inhaler Inhale 1 puff into the lungs 2 (two) times daily.   Yes [provider]  cetirizine (ZYRTEC) 10 MG tablet Take 10 mg by mouth daily.    Yes [provider]  diclofenac (VOLTAREN) 75 MG EC tablet Take 75 mg by mouth 2 (two) times daily.   Yes [provider]  fluticasone (FLONASE) 50  MCG/ACT nasal spray Place 1 spray into both nostrils 2 (two) times daily.    Yes [provider]  gabapentin (NEURONTIN) 300 MG capsule Take 300 mg by mouth 2 (two) times daily.    Yes [provider]  Melatonin 3 MG TABS Take 3 mg by mouth at bedtime.   Yes [provider]  methocarbamol (ROBAXIN) 500 MG tablet Take 500 mg by mouth at bedtime as needed for muscle spasms.   Yes [provider]  tamsulosin (FLOMAX) 0.4 MG CAPS capsule Take 0.4 mg by mouth daily after supper.   Yes [provider]  Tiotropium Bromide Monohydrate (SPIRIVA RESPIMAT) 1.25 MCG/ACT AERS Inhale 2  puffs into the lungs daily.   Yes [provider]  amLODipine (NORVASC) 10 MG tablet Take 1 tablet (10 mg total) by mouth daily. Patient not taking: Reported on 05/31/2018 10/25/17   Aline August, MD  azithromycin (ZITHROMAX) 250 MG tablet Use one tablet daily. Patient not taking: Reported on 05/31/2018 05/04/18   Isaac Bliss, Rayford Halsted, MD  montelukast (SINGULAIR) 10 MG tablet Take 1 tablet (10 mg total) by mouth at bedtime. Patient not taking: Reported on 05/31/2018 10/24/17   Aline August, MD  predniSONE (DELTASONE) 10 MG tablet Take 1 tablet (10 mg total) by mouth daily with breakfast. Take 6 tablets today and then decrease by 1 tablet daily until none are left. Patient not taking: Reported on 05/31/2018 05/04/18   Isaac Bliss, Rayford Halsted, MD    Physical Exam: BP 138/83   Pulse (!) 101   Resp (!) 22   Ht 6\' 1"  (1.854 m)   Wt 72.6 kg   SpO2 100%   BMI 21.11 kg/m   Exam:  . General: 67 y.o. year-old male well developed well nourished in no acute distress.  Alert and oriented x3. . Cardiovascular: Regular rate and rhythm with no rubs or gallops.  No thyromegaly or JVD noted.   Marland Kitchen Respiratory: Bilateral wheezing throughout all lung fields.  Increased inspiratory effort. . Abdomen: Soft nontender nondistended with normal bowel sounds x4 quadrants. . Musculoskeletal: No lower extremity edema. 2/4 pulses in all 4 extremities.  Slight tenderness on the right chest wall pain is increased with deep breathing . Skin: No ulcerative lesions noted or rashes, . Psychiatry: Mood is appropriate for condition and setting           Labs on Admission:  Basic Metabolic Panel: Recent Labs  Lab 05/31/18 0827  NA 142  K 4.0  CL 109  CO2 24  GLUCOSE 98  BUN 14  CREATININE 1.13  CALCIUM 9.1   Liver Function Tests: Recent Labs  Lab 05/31/18 0827  AST 19  ALT 18  ALKPHOS 81  BILITOT 0.7  PROT 8.2*  ALBUMIN 3.8   No results for input(s): LIPASE, AMYLASE in the last  168 hours. No results for input(s): AMMONIA in the last 168 hours. CBC: Recent Labs  Lab 05/31/18 0827  WBC 7.9  NEUTROABS 5.1  HGB 13.9  HCT 44.2  MCV 95.1  PLT 234   Cardiac Enzymes: No results for input(s): CKTOTAL, CKMB, CKMBINDEX, TROPONINI in the last 168 hours.  BNP (last 3 results) No results for input(s): BNP in the last 8760 hours.  ProBNP (last 3 results) No results for input(s): PROBNP in the last 8760 hours.  CBG: No results for input(s): GLUCAP in the last 168 hours.  Radiological Exams on Admission: Dg Chest 2 View  Result Date: 05/31/2018 CLINICAL DATA:  "sharp"  right-sided chest pains at ~0300 hours today. He also tells me he has "COPD" and had a three day hospitalization for COPD at the beginning of this month. He also tells me he had a nebulizer at home "but I'm out of that medication". He states he is not O2 dependent. He is short of breath; but not dyspneic. EXAM: CHEST - 2 VIEW COMPARISON:  none FINDINGS: Lungs are clear. Heart size and mediastinal contours are within normal limits. No effusion.  No pneumothorax. Visualized bones unremarkable. IMPRESSION: No acute cardiopulmonary disease. Electronically Signed   By: Lucrezia Europe M.D.   On: 05/31/2018 09:06   Ct Angio Chest Pe W And/or Wo Contrast  Result Date: 05/31/2018 CLINICAL DATA:  Chest pain EXAM: CT ANGIOGRAPHY CHEST WITH CONTRAST TECHNIQUE: Multidetector CT imaging of the chest was performed using the standard protocol during bolus administration of intravenous contrast. Multiplanar CT image reconstructions and MIPs were obtained to evaluate the vascular anatomy. CONTRAST:  170mL ISOVUE-370 IOPAMIDOL (ISOVUE-370) INJECTION 76% COMPARISON:  05/02/2018 FINDINGS: Cardiovascular: The study is positive for acute pulmonary thromboembolism. There is low-density filling defect extending from the right upper lobe lobar pulmonary artery branch extending into segmental branches. Pulmonary arterial tree is otherwise  free of emboli. No evidence of aortic aneurysm. No obvious aortic dissection or acute intramural hematoma. Atherosclerotic vascular calcifications of the aorta and great vessels are noted. Mild 3 vessel coronary artery calcification. The heart is normal in size. Mediastinum/Nodes: No abnormal mediastinal adenopathy or pericardial effusion. Lungs/Pleura: Severe panlobular emphysema throughout the lungs. Reticulonodular opacities in the anterior right middle lobe and right upper lobe are likely related to scarring. No mass or consolidation. No pneumothorax. No pleural effusion. Upper Abdomen: Mild left hydronephrosis without delayed nephrogram phase is of unknown significance. Musculoskeletal: No vertebral compression deformity. C6-7 degenerative disc disease is partially imaged. Review of the MIP images confirms the above findings. IMPRESSION: The study is positive for lobar and segmental right upper lobe pulmonary thromboembolism. Critical Value/emergent results were called by telephone at the time of interpretation on 05/31/2018 at 10:29 am to Dr. Nanda Quinton , who verbally acknowledged these results. Mild left hydronephrosis of unknown significance. Aortic Atherosclerosis (ICD10-I70.0) and Emphysema (ICD10-J43.9). Electronically Signed   By: Marybelle Killings M.D.   On: 05/31/2018 10:29    EKG: Independently reviewed.  Per ER MD no STEMI left atrial enlargement  Assessment/Plan Present on Admission: . Pulmonary embolism ([place]) . COPD exacerbation ([place]) . Hypoxia . Respiratory distress . Essential hypertension  Principal Problem:   Pulmonary embolism ([facility]) Active Problems:   COPD exacerbation ([facility])   Hypoxia   Respiratory distress   Essential hypertension  1.  Right-sided pulmonary embolism.  Of note is that his CTA that was done May 02, 2018 did not show any evidence of PE.  Patient has been started on IV heparin per pharmacy  2.  COPD exacerbation patient will be started on continuous neb  this shortness of breath is much improved ,we will continue with the nebulizer treatment as needed Solu-Medrol IV was started in the emergency room I will continue with that and I will add Zithromax and ceftriaxone for COPD exacerbation O2 nasal cannula to maintain oxygen saturation of above 90  3.  Hypertension continue home medication  4.  Low back pain ,chronic.  Continue Robaxin gabapentin and Tylenol per home  5.  Tachycardia, most likely from nebulizer treatment and his presentation of pulmonary embolism with shortness of breath we will continue to monitor  Severity of  Illness: The appropriate patient status for this patient is INPATIENT. Inpatient status is judged to be reasonable and necessary in order to provide the required intensity of service to ensure the patient's safety. The patient's presenting symptoms, physical exam findings, and initial radiographic and laboratory data in the context of their chronic comorbidities is felt to place them at high risk for further clinical deterioration. Furthermore, it is not anticipated that the patient will be medically stable for discharge from the hospital within 2 midnights of admission. The following factors support the patient status of inpatient.   " The patient's presenting symptoms include chest pain shortness of breath hypoxia pulmonary embolism. " The worrisome physical exam findings include pulmonary embolism COPD exacerbation. " The initial radiographic and laboratory data are worrisome because of pulmonary embolism COPD exacerbation. " The chronic co-morbidities include OPD.   * I certify that at the point of admission it is my clinical judgment that the patient will require inpatient hospital care spanning beyond 2 midnights from the point of admission due to high intensity of service, high risk for further deterioration and high frequency of surveillance required.*    DVT prophylaxis: Strength full dose heparin for PE  Code  Status: DNR his daughter Jonelle Sidle Ginty who is at bedside is his power of attorney.  Patient and daughter confirm he wants to be made DNR  Family Communication: Daughter Tiffany Helf who is at bedside  Disposition Plan: Home when stable  Consults called: None  Admission status: Inpatient    Cristal Deer MD [facility] Pager [phone]  If 7PM-7AM, please contact night-coverage [email or web address] Password [email or web address]  05/31/2018, 11:30 AM

## 2018-05-31 NOTE — Progress Notes (Signed)
ANTICOAGULATION CONSULT NOTE - Initial Consult  Pharmacy Consult for heparin Indication: pulmonary embolus  No Known Allergies  Patient Measurements: Height: 6\' 1"  (185.4 cm) Weight: 160 lb (72.6 kg) IBW/kg (Calculated) : 79.9 Heparin Dosing Weight: 72.6 kg  Vital Signs: BP: 136/83 (11/30 0800) Pulse Rate: 94 (11/30 0800)  Labs: Recent Labs    05/31/18 0827  HGB 13.9  HCT 44.2  PLT 234  CREATININE 1.13    Estimated Creatinine Clearance: 66 mL/min (by C-G formula based on SCr of 1.13 mg/dL).   Medical History: Past Medical History:  Diagnosis Date  . Arthritis   . Chronic back pain   . Chronic hip pain   . COPD (chronic obstructive pulmonary disease) ([place])   . Emphysema lung ([facility])     Medications:   (Not in a hospital admission) Scheduled:  . heparin  2,300 Units Intravenous Once  . iopamidol      . sodium chloride (PF)       Infusions:  . albuterol 15 mg/hr (05/31/18 1018)  . heparin      Assessment: 67yo M with acute onset of right-sided chest pain 05/31/18. CTA revealed PE.    Goal of Therapy:  Heparin level 0.3-0.7 units/ml Monitor platelets by anticoagulation protocol: Yes   Plan:  Draw baseline aPTT and PT/INR. Give heparin 2300 units IV bolus then start infusion at 1400 units/hr. Check heparin level in 6hrs and daily thereafter. Check CBC q24h while on heparin. F/u daily.  Romeo Rabon, PharmD. Mobile: [phone]. 05/31/2018,10:39 AM.

## 2018-05-31 NOTE — ED Triage Notes (Signed)
He tells me he was awakened by "sharp" right-sided chest pains at ~0300 hours today. He also tells me he has "COPD" and had a three day hospitalization for COPD at the beginning of this month. He also tells me he had a nebulizer at home "but I'm out of that medication". He states he is not O2 dependent. He is short of breath; but not dyspneic. RN meets pt. In room and performs v.s/EKG.

## 2018-05-31 NOTE — Progress Notes (Signed)
PT Cancellation Note  Patient Details Name: Gregory Christensen MRN: [id number] DOB: 09-16-1950   Cancelled Treatment:    Reason Eval/Treat Not Completed: Medical issues which prohibited therapy; started on Heparin today, will defer until therapeutic level.    [facility] 05/31/2018, 2:46 PM

## 2018-06-01 ENCOUNTER — Inpatient Hospital Stay (HOSPITAL_COMMUNITY): Payer: Medicare HMO

## 2018-06-01 DIAGNOSIS — I2699 Other pulmonary embolism without acute cor pulmonale: Secondary | ICD-10-CM

## 2018-06-01 DIAGNOSIS — R079 Chest pain, unspecified: Secondary | ICD-10-CM

## 2018-06-01 DIAGNOSIS — J9601 Acute respiratory failure with hypoxia: Secondary | ICD-10-CM

## 2018-06-01 LAB — CBC
HEMATOCRIT: 41 % (ref 39.0–52.0)
Hemoglobin: 12.6 g/dL — ABNORMAL LOW (ref 13.0–17.0)
MCH: 29.2 pg (ref 26.0–34.0)
MCHC: 30.7 g/dL (ref 30.0–36.0)
MCV: 94.9 fL (ref 80.0–100.0)
NRBC: 0 % (ref 0.0–0.2)
Platelets: 232 10*3/uL (ref 150–400)
RBC: 4.32 MIL/uL (ref 4.22–5.81)
RDW: 13.6 % (ref 11.5–15.5)
WBC: 11.1 10*3/uL — ABNORMAL HIGH (ref 4.0–10.5)

## 2018-06-01 LAB — BASIC METABOLIC PANEL
Anion gap: 9 (ref 5–15)
BUN: 17 mg/dL (ref 8–23)
CO2: 21 mmol/L — AB (ref 22–32)
Calcium: 9.1 mg/dL (ref 8.9–10.3)
Chloride: 109 mmol/L (ref 98–111)
Creatinine, Ser: 1.2 mg/dL (ref 0.61–1.24)
GFR calc Af Amer: >60 mL/min (ref >60)
GFR calc non Af Amer: >60 mL/min (ref >60)
Glucose, Bld: 180 mg/dL — ABNORMAL HIGH (ref 70–99)
Potassium: 4.7 mmol/L (ref 3.5–5.1)
Sodium: 139 mmol/L (ref 135–145)

## 2018-06-01 LAB — ECHOCARDIOGRAM COMPLETE
Height: 73 in
WEIGHTICAEL: 2488 [oz_av]

## 2018-06-01 LAB — HEPARIN LEVEL (UNFRACTIONATED)
Heparin Unfractionated: 0.62 IU/mL (ref 0.30–0.70)
Heparin Unfractionated: 0.73 IU/mL — ABNORMAL HIGH (ref 0.30–0.70)
Heparin Unfractionated: 0.79 IU/mL — ABNORMAL HIGH (ref 0.30–0.70)

## 2018-06-01 LAB — HIV ANTIBODY (ROUTINE TESTING W REFLEX): HIV Screen 4th Generation wRfx: NONREACTIVE

## 2018-06-01 MED ORDER — IPRATROPIUM-ALBUTEROL 0.5-2.5 (3) MG/3ML IN SOLN
3.0000 mL | Freq: Four times a day (QID) | RESPIRATORY_TRACT | Status: DC
  Administered 2018-06-01 – 2018-06-03 (×9): 3 mL via RESPIRATORY_TRACT
  Filled 2018-06-01 (×9): qty 3

## 2018-06-01 MED ORDER — HEPARIN (PORCINE) 25000 UT/250ML-% IV SOLN
1200.0000 [IU]/h | INTRAVENOUS | Status: DC
  Administered 2018-06-01: 1300 [IU]/h via INTRAVENOUS
  Administered 2018-06-01 – 2018-06-02 (×2): 1200 [IU]/h via INTRAVENOUS
  Filled 2018-06-01 (×3): qty 250

## 2018-06-01 NOTE — Evaluation (Signed)
Physical Therapy Evaluation Patient Details Name: Gregory Christensen MRN: [id number] DOB: 20-Oct-1950 Today's Date: 06/01/2018   History of Present Illness  Gregory Christensen is a 67 y.o. male with medical history significant for COPD not on home O2, former 26-year pack history who recently quit tobacco use, arthritis chronic low back pain admitted due to SOB R side pain found to be positive for R PE and L LE DVT.  Clinical Impression  Patient presents close to functional baseline without current need for PT.  Utilized IV pole to ambulate this session with positive dyspnea and wheezing, though SpO2 90% most of the time ambulating on RA.  Normally at home uses cane intermittently.  Feel safe for d/c home without follow up PT needs and no acute PT needs.  Did encourage ambulation in hallway to maximize safety and cardiorespiratory endurance.  Also recommend RN check SpO2 when ambulating to ensure no home O2 needs.    Follow Up Recommendations No PT follow up    Equipment Recommendations  None recommended by PT    Recommendations for Other Services       Precautions / Restrictions Precautions Precautions: None      Mobility  Bed Mobility Overal bed mobility: Modified Independent                Transfers Overall transfer level: Modified independent                  Ambulation/Gait Ambulation/Gait assistance: Independent Gait Distance (Feet): 200 Feet Assistive device: IV Pole Gait Pattern/deviations: Step-through pattern;Decreased stride length     General Gait Details: shortening steps due to IV pole.  Noted SpO2 on RA throughout ambulation 89-91% though dyspneic and wheezing.   Stairs            Wheelchair Mobility    Modified Rankin (Stroke Patients Only)       Balance Overall balance assessment: No apparent balance deficits (not formally assessed)                                           Pertinent Vitals/Pain Pain Assessment:  No/denies pain    Home Living Family/patient expects to be discharged to:: Private residence Living Arrangements: Parent Available Help at Discharge: Family;Available PRN/intermittently Type of Home: House Home Access: Stairs to enter Entrance Stairs-Rails: None Entrance Stairs-Number of Steps: 1 Home Layout: One level Home Equipment: Cane - single point(mother has multiple walkers, seat in shower, etc)      Prior Function Level of Independence: Independent with assistive device(s)         Comments: uses cane sometimes. can walk without it.     Hand Dominance        Extremity/Trunk Assessment   Upper Extremity Assessment Upper Extremity Assessment: Overall WFL for tasks assessed    Lower Extremity Assessment Lower Extremity Assessment: Overall WFL for tasks assessed       Communication   Communication: No difficulties  Cognition Arousal/Alertness: Awake/alert Behavior During Therapy: WFL for tasks assessed/performed Overall Cognitive Status: Within Functional Limits for tasks assessed                                        General Comments      Exercises     Assessment/Plan    PT Assessment  Patent does not need any further PT services  PT Problem List         PT Treatment Interventions      PT Goals (Current goals can be found in the Care Plan section)  Acute Rehab PT Goals PT Goal Formulation: All assessment and education complete, DC therapy    Frequency     Barriers to discharge        Co-evaluation               AM-PAC PT "6 Clicks" Mobility  Outcome Measure Help needed turning from your back to your side while in a flat bed without using bedrails?: None Help needed moving from lying on your back to sitting on the side of a flat bed without using bedrails?: None Help needed moving to and from a bed to a chair (including a wheelchair)?: None Help needed standing up from a chair using your arms (e.g., wheelchair or  bedside chair)?: None Help needed to walk in hospital room?: A Little Help needed climbing 3-5 steps with a railing? : A Little 6 Click Score: 22    End of Session Equipment Utilized During Treatment: Gait belt Activity Tolerance: Patient tolerated treatment well Patient left: in bed;with call bell/phone within reach   PT Visit Diagnosis: Muscle weakness (generalized) (M62.81)    Time: 1624-4695 PT Time Calculation (min) (ACUTE ONLY): 20 min   Charges:   PT Evaluation $PT Eval Low Complexity: [place], PT Acute Rehabilitation Services [phone] 06/01/2018   Reginia Naas 06/01/2018, 5:03 PM

## 2018-06-01 NOTE — Progress Notes (Signed)
Nutrition Brief Note  RD consulted via COPD gold protocol.  Patient's weight is stable. Patient is eating 100% of meals.  Wt Readings from Last 15 Encounters:  06/01/18 70.5 kg  05/02/18 72.6 kg  11/28/17 73.6 kg  10/18/17 69.9 kg  11/29/16 69.9 kg    Body mass index is 20.52 kg/m. Patient meets criteria for normal based on current BMI.   Current diet order is CHO modified, patient is consuming approximately 100% of meals at this time. Labs and medications reviewed.   No nutrition interventions warranted at this time. If nutrition issues arise, please consult RD.   Clayton Bibles, MS, RD, [place] Dietitian Pager: [phone] After Hours Pager: [phone]

## 2018-06-01 NOTE — Progress Notes (Signed)
  Echocardiogram 2D Echocardiogram has been performed.  Gregory Christensen M 06/01/2018, 2:02 PM

## 2018-06-01 NOTE — Progress Notes (Signed)
ANTICOAGULATION CONSULT NOTE - Follow Up Consult  Pharmacy Consult for Heparin Indication: pulmonary embolus  No Known Allergies  Patient Measurements: Height: 6\' 1"  (185.4 cm) Weight: 160 lb (72.6 kg) IBW/kg (Calculated) : 79.9 Heparin Dosing Weight: TBW  Vital Signs: Temp: 97.8 F (36.6 C) (11/30 2145) Temp Source: Oral (11/30 2145) BP: 133/72 (11/30 2145) Pulse Rate: 80 (11/30 2145)  Labs: Recent Labs    05/31/18 0827 05/31/18 1650 05/31/18 2335  HGB 13.9  --   --   HCT 44.2  --   --   PLT 234  --   --   APTT 34  --   --   LABPROT 14.3  --   --   INR 1.12  --   --   HEPARINUNFRC  --  0.69 0.79*  CREATININE 1.13  --   --     Estimated Creatinine Clearance: 66 mL/min (by C-G formula based on SCr of 1.13 mg/dL).   Medications:  Infusions:  . heparin      Assessment: 42 yoM admitted on 11/30 with acute onset of right-sided chest pain 05/31/18. CTA revealed PE.  PMH of COPD, arthritis, of note recent CTA (05/02/18) did not show any evidence of PE.  No PTA anticoagulation.  Pharmacy is consulted to dose Heparin IV.  11/30 Baseline APTT and INR WNL First heparin level 0.69, therapeutic on heparin at 1400 units/hr. CBC:  Hgb 13.9, Plt 234 No bleeding or complications reported. 2349 HL=0.79 slightly above goal, no infusion or bleeding issues per RN.   Goal of Therapy:  Heparin level 0.3-0.7 units/ml Monitor platelets by anticoagulation protocol: Yes   Plan:   Decrease heparin drip to 1300 units/hr  Recheck HL in 6 hours   Daily heparin level and CBC when stable.   Dorrene German 06/01/2018 12:42 AM

## 2018-06-01 NOTE — Progress Notes (Signed)
  Echocardiogram 2D Echocardiogram has been performed.  Gregory Christensen M 06/01/2018, 2:03 PM

## 2018-06-01 NOTE — Progress Notes (Addendum)
PROGRESS NOTE  Gregory Christensen EQA:[id number] DOB: 01/01/1951 DOA: 05/31/2018 PCP: Leighton Ruff, MD  HPI/Recap of past 24 hours:  Wheezing, denies chest pain No fever Daughter at bedside  Assessment/Plan: Principal Problem:   Pulmonary embolism ([place]) Active Problems:   COPD exacerbation ([place])   Hypoxia   Respiratory distress   Essential hypertension   Acute hypoxic respiratory failure -Due to PE and COPD exacerbation -Report baseline not O2 dependent, suspect will need home O2  PE/DVT -Reports h/o left leg edema in 12/2017 , venous US "DVT located in the left proximal femoral vein to the popliteal vein and also extends to the Posterior Tibial Vein." -troponin negative -continue heparin drip Get echocardiogram   COPD exacerbation -[facility] -He run out his nebs meds in 07/2017 -Has diffuse wheezing, continue steroid, /zithromax, scheduled nebs   Mild left hydronephrosis of unknown significance. Denies flank pain, denies urinary symptom PVR 73cc 27mins after voiding 450cc Cr unremarkable Continue home meds flomax   Smoking I have discussed tobacco cessation with the patient.  I have counseled the patient regarding the negative impacts of continued tobacco use including but not limited to lung cancer, COPD, and cardiovascular disease.  I have discussed alternatives to tobacco and modalities that may help facilitate tobacco cessation including but not limited to biofeedback, hypnosis, and medications.  Total time spent with tobacco counseling was 4 minutes.   Alcohol Denies h/o alcohol withdrawal , will monitor  FTT: PT eval  Per daughter's reports, patient has prostate cancer, he is followed by urology at [facility], he has an appointment with urology  December. 9th  Code Status: DNR  Family Communication: patient and daughter at bedside  Disposition Plan: home in 1-2 days Likely will need home health and home o2 when medically ready to  discharge   Consultants:  none  Procedures:  none  Antibiotics:  zithromax   Objective: BP (!) 141/81 (BP Location: Left Arm)   Pulse 72   Temp 97.8 F (36.6 C) (Oral)   Resp 18   Ht 6\' 1"  (1.854 m)   Wt 70.5 kg   SpO2 98%   BMI 20.52 kg/m   Intake/Output Summary (Last 24 hours) at 06/01/2018 1129 Last data filed at 06/01/2018 0602 Gross per 24 hour  Intake -  Output 601 ml  Net -601 ml   Filed Weights   05/31/18 0800 06/01/18 0602  Weight: 72.6 kg 70.5 kg    Exam: Patient is examined daily including today on 06/01/2018, exams remain the same as of yesterday except that has changed    General:  NAD  Cardiovascular: RRR  Respiratory: diffuse wheezing bilaterally  Abdomen: Soft/ND/NT, positive BS  Musculoskeletal: No Edema  Neuro: alert, oriented   Data Reviewed: Basic Metabolic Panel: Recent Labs  Lab 05/31/18 0827 06/01/18 0549  NA 142 139  K 4.0 4.7  CL 109 109  CO2 24 21*  GLUCOSE 98 180*  BUN 14 17  CREATININE 1.13 1.20  CALCIUM 9.1 9.1   Liver Function Tests: Recent Labs  Lab 05/31/18 0827  AST 19  ALT 18  ALKPHOS 81  BILITOT 0.7  PROT 8.2*  ALBUMIN 3.8   No results for input(s): LIPASE, AMYLASE in the last 168 hours. No results for input(s): AMMONIA in the last 168 hours. CBC: Recent Labs  Lab 05/31/18 0827 06/01/18 0549  WBC 7.9 11.1*  NEUTROABS 5.1  --   HGB 13.9 12.6*  HCT 44.2 41.0  MCV 95.1 94.9  PLT  234 232   Cardiac Enzymes:   No results for input(s): CKTOTAL, CKMB, CKMBINDEX, TROPONINI in the last 168 hours. BNP (last 3 results) No results for input(s): BNP in the last 8760 hours.  ProBNP (last 3 results) No results for input(s): PROBNP in the last 8760 hours.  CBG: No results for input(s): GLUCAP in the last 168 hours.  No results found for this or any previous visit (from the past 240 hour(s)).   Studies: No results found.  Scheduled Meds: . amLODipine  2.5 mg Oral Daily  . azithromycin  500  mg Oral Daily  . diclofenac  75 mg Oral BID  . fluticasone  1 spray Each Nare BID  . gabapentin  300 mg Oral BID  . loratadine  10 mg Oral Daily  . Melatonin  3 mg Oral QHS  . methylPREDNISolone (SOLU-MEDROL) injection  125 mg Intravenous Q12H  . montelukast  10 mg Oral QHS  . tamsulosin  0.4 mg Oral QPC supper  . umeclidinium bromide  1 puff Inhalation Daily    Continuous Infusions: . heparin 1,200 Units/hr (06/01/18 0948)     Time spent: 39mins I have personally reviewed and interpreted on  06/01/2018 daily labs, tele strips, imagings as discussed above under date review session and assessment and plans.  I reviewed all nursing notes, pharmacy notes,  vitals, pertinent old records  I have discussed plan of care as described above with RN , patient and family on 06/01/2018   Florencia Reasons MD, PhD  [facility] Pager [phone]. If 7PM-7AM, please contact night-coverage at [email or web address], password [email or web address] 06/01/2018, 11:29 AM  LOS: 1 day

## 2018-06-01 NOTE — Progress Notes (Addendum)
ANTICOAGULATION CONSULT NOTE - Follow Up Consult  Pharmacy Consult for Heparin Indication: pulmonary embolus /DVT  No Known Allergies  Patient Measurements: Height: 6\' 1"  (185.4 cm) Weight: 155 lb 8 oz (70.5 kg) IBW/kg (Calculated) : 79.9 Heparin Dosing Weight: TBW  Vital Signs: Temp: 97.8 F (36.6 C) (12/01 0602) Temp Source: Oral (12/01 0602) BP: 141/81 (12/01 0602) Pulse Rate: 72 (12/01 0602)  Labs: Recent Labs    05/31/18 0827 05/31/18 1650 05/31/18 2335 06/01/18 0549 06/01/18 0746  HGB 13.9  --   --  12.6*  --   HCT 44.2  --   --  41.0  --   PLT 234  --   --  232  --   APTT 34  --   --   --   --   LABPROT 14.3  --   --   --   --   INR 1.12  --   --   --   --   HEPARINUNFRC  --  0.69 0.79*  --  0.73*  CREATININE 1.13  --   --  1.20  --     Estimated Creatinine Clearance: 60.4 mL/min (by C-G formula based on SCr of 1.2 mg/dL).   Medications:  Infusions:  . heparin 1,300 Units/hr (06/01/18 0108)    Assessment: 16 yoM admitted on 11/30 with acute onset of right-sided chest pain 05/31/18. CTA revealed PE.  PMH of COPD, arthritis, of note recent CTA (05/02/18) did not show any evidence of PE.  No PTA anticoagulation.  Pharmacy is consulted to dose Heparin IV.  Baseline APTT and INR WNL.  Today, 06/01/2018:  Heparin level remains slightly above goal range (0.73) on 1300 units/hr.  CBC:  WNL  No bleeding or complications reported by RN  Goal of Therapy:  Heparin level 0.3-0.7 units/ml Monitor platelets by anticoagulation protocol: Yes   Plan:   Decrease heparin IV infusion to 1200 units/hr  Heparin level in 6 hours    Daily heparin level and CBC when stable.  Netta Cedars, PharmD, BCPS Pager: [phone] 06/01/2018 8:57 AM  Addendum:   Heparin level WNL (0.61) on 1200 units/hr.  Continue current rate & f/u AM Labs.   F/U plans to transition to oral anticoagulation Netta Cedars, PharmD, BCPS 06/01/2018@4 :56 PM

## 2018-06-01 NOTE — Progress Notes (Signed)
Pt voided 450 ml using urinal- 10 mins later PVR shower 73 ml in bladder. MD Erlinda Hong paged to be made aware.

## 2018-06-01 NOTE — Progress Notes (Addendum)
Evidence of DVT located in the left proximal femoral vein to the popliteal vein and also extends to the Posterior Tibial Vein. No evidence of DVT within the right lower extremity. No cystic structure found in the popliteal fossa bilaterally.   Darlina Sicilian RDCS

## 2018-06-02 LAB — BASIC METABOLIC PANEL
Anion gap: 10 (ref 5–15)
BUN: 18 mg/dL (ref 8–23)
CO2: 21 mmol/L — ABNORMAL LOW (ref 22–32)
Calcium: 8.8 mg/dL — ABNORMAL LOW (ref 8.9–10.3)
Chloride: 107 mmol/L (ref 98–111)
Creatinine, Ser: 1.25 mg/dL — ABNORMAL HIGH (ref 0.61–1.24)
GFR calc non Af Amer: 60 mL/min — ABNORMAL LOW (ref >60)
Glucose, Bld: 172 mg/dL — ABNORMAL HIGH (ref 70–99)
Potassium: 4.2 mmol/L (ref 3.5–5.1)
Sodium: 138 mmol/L (ref 135–145)

## 2018-06-02 LAB — CBC
HCT: 35.4 % — ABNORMAL LOW (ref 39.0–52.0)
HEMOGLOBIN: 11.1 g/dL — AB (ref 13.0–17.0)
MCH: 28.3 pg (ref 26.0–34.0)
MCHC: 31.4 g/dL (ref 30.0–36.0)
MCV: 90.3 fL (ref 80.0–100.0)
Platelets: 229 10*3/uL (ref 150–400)
RBC: 3.92 MIL/uL — ABNORMAL LOW (ref 4.22–5.81)
RDW: 13.5 % (ref 11.5–15.5)
WBC: 10.1 10*3/uL (ref 4.0–10.5)
nRBC: 0 % (ref 0.0–0.2)

## 2018-06-02 LAB — HEPARIN LEVEL (UNFRACTIONATED): Heparin Unfractionated: 0.55 IU/mL (ref 0.30–0.70)

## 2018-06-02 LAB — TSH: TSH: 0.466 u[IU]/mL (ref 0.350–4.500)

## 2018-06-02 MED ORDER — GUAIFENESIN ER 600 MG PO TB12
600.0000 mg | ORAL_TABLET | Freq: Two times a day (BID) | ORAL | Status: DC
  Administered 2018-06-02 – 2018-06-04 (×5): 600 mg via ORAL
  Filled 2018-06-02 (×5): qty 1

## 2018-06-02 MED ORDER — SODIUM CHLORIDE 0.9 % IV SOLN
INTRAVENOUS | Status: DC
  Administered 2018-06-02 (×2): via INTRAVENOUS

## 2018-06-02 NOTE — Progress Notes (Signed)
ANTICOAGULATION CONSULT NOTE - Follow Up Consult  Pharmacy Consult for Heparin Indication: pulmonary embolus /DVT  No Known Allergies  Patient Measurements: Height: 6\' 1"  (185.4 cm) Weight: 157 lb 1.6 oz (71.3 kg) IBW/kg (Calculated) : 79.9 Heparin Dosing Weight: TBW  Vital Signs: Temp: 98.3 F (36.8 C) (12/02 0707) Temp Source: Oral (12/02 0707) BP: 169/83 (12/02 0707) Pulse Rate: 69 (12/02 0707)  Labs: Recent Labs    05/31/18 0827  06/01/18 0549 06/01/18 0746 06/01/18 1603 06/02/18 0554  HGB 13.9  --  12.6*  --   --  11.1*  HCT 44.2  --  41.0  --   --  35.4*  PLT 234  --  232  --   --  229  APTT 34  --   --   --   --   --   LABPROT 14.3  --   --   --   --   --   INR 1.12  --   --   --   --   --   HEPARINUNFRC  --    < >  --  0.73* 0.62 0.55  CREATININE 1.13  --  1.20  --   --  1.25*   < > = values in this interval not displayed.    Estimated Creatinine Clearance: 58.6 mL/min (A) (by C-G formula based on SCr of 1.25 mg/dL (H)).   Medications:  Infusions:  . sodium chloride 75 mL/hr at 06/02/18 0827  . heparin 1,200 Units/hr (06/01/18 2238)    Assessment: 89 yoM admitted on 11/30 with acute onset of right-sided chest pain 05/31/18. CTA revealed PE.  PMH of COPD, arthritis, of note recent CTA (05/02/18) did not show any evidence of PE.  No PTA anticoagulation.  Pharmacy is consulted to dose Heparin IV.  Baseline APTT and INR WNL.  Today, 06/02/2018:  Heparin level is therapeutic at 0.55 on 1200 units/hr.  CBC:  WNL, stable  Scr 1.25 mg/dl, stable   No bleeding or complications reported by RN  Goal of Therapy:  Heparin level 0.3-0.7 units/ml Monitor platelets by anticoagulation protocol: Yes   Plan:   Continue heparin IV infusion at 1200 units/hr  Daily heparin level and CBC when stable.  Monitor for signs and symptoms of bleeding   Royetta Asal, PharmD, BCPS Pager [phone] 06/02/2018 11:40 AM

## 2018-06-02 NOTE — Progress Notes (Signed)
PROGRESS NOTE  Gregory Christensen CNO:[id number] DOB: [birth year] DOA: 05/31/2018 PCP: Leighton Ruff, MD  HPI/Recap of past 24 hours:  Continue Wheezing,  Reports some  chest pain and left leg pain, no hypoxia on 2liter at rest No fever Daughter at bedside  Assessment/Plan: Principal Problem:   Pulmonary embolism ([place]) Active Problems:   COPD exacerbation ([place])   Hypoxia   Respiratory distress   Essential hypertension   Acute hypoxemic respiratory failure ([place])   Acute hypoxic respiratory failure -Due to PE and COPD exacerbation -Report baseline not O2 dependent, suspect will need home O2 -will need to ambulate to check o2 sats tomorow  PE/DVT -Reports h/o left leg edema in 12/2017 , venous US "DVT located in the left proximal femoral vein to the popliteal vein and also extends to the Posterior Tibial Vein." -troponin negative -echocardiogram lvef wnl, grade I diastolic dysfunction, right ventrical normal in size -continue heparin drip today, plan to transition to eliquis tomorrow if cr stable  COPD exacerbation -[facility] -He run out his nebs meds in 07/2017 -Has diffuse wheezing, continue steroid, zithromax, scheduled nebs   Cr trending up, clinically dry, will start hydration, d/c home meds diclofenac repeat bmp in am  Mild left hydronephrosis of unknown significance. Denies flank pain, denies urinary symptom PVR 73cc 69mins after voiding 450cc Cr unremarkable Continue home meds flomax   Smoking Cessation education provided  Alcohol Denies h/o alcohol withdrawal , will monitor  FTT: PT eval  Per daughter's reports, patient has prostate cancer, he is followed by urology at [facility], he has an appointment with urology  December. 9th  Code Status: DNR  Family Communication: patient and daughter at bedside  Disposition Plan: home , hopefully tomorrow, it is his [birth year] this [birth year] Likely will need  home o2 when medically ready to  discharge   Consultants:  none  Procedures:  none  Antibiotics:  zithromax   Objective: BP (!) 169/83 (BP Location: Left Arm)   Pulse 69   Temp 98.3 F (36.8 C) (Oral)   Resp 18   Ht 6\' 1"  (1.854 m)   Wt 71.3 kg   SpO2 98%   BMI 20.73 kg/m   Intake/Output Summary (Last 24 hours) at 06/02/2018 1143 Last data filed at 06/02/2018 2947 Gross per 24 hour  Intake 859.07 ml  Output 2025 ml  Net -1165.93 ml   Filed Weights   05/31/18 0800 06/01/18 0602 06/02/18 0707  Weight: 72.6 kg 70.5 kg 71.3 kg    Exam: Patient is examined daily including today on 06/02/2018, exams remain the same as of yesterday except that has changed    General:  NAD  Cardiovascular: RRR  Respiratory: diffuse wheezing bilaterally  Abdomen: Soft/ND/NT, positive BS  Musculoskeletal: No Edema  Neuro: alert, oriented   Data Reviewed: Basic Metabolic Panel: Recent Labs  Lab 05/31/18 0827 06/01/18 0549 06/02/18 0554  NA 142 139 138  K 4.0 4.7 4.2  CL 109 109 107  CO2 24 21* 21*  GLUCOSE 98 180* 172*  BUN 14 17 18   CREATININE 1.13 1.20 1.25*  CALCIUM 9.1 9.1 8.8*   Liver Function Tests: Recent Labs  Lab 05/31/18 0827  AST 19  ALT 18  ALKPHOS 81  BILITOT 0.7  PROT 8.2*  ALBUMIN 3.8   No results for input(s): LIPASE, AMYLASE in the last 168 hours. No results for input(s): AMMONIA in the last 168 hours. CBC: Recent Labs  Lab 05/31/18 0827 06/01/18 0549 06/02/18 0554  WBC  7.9 11.1* 10.1  NEUTROABS 5.1  --   --   HGB 13.9 12.6* 11.1*  HCT 44.2 41.0 35.4*  MCV 95.1 94.9 90.3  PLT 234 232 229   Cardiac Enzymes:   No results for input(s): CKTOTAL, CKMB, CKMBINDEX, TROPONINI in the last 168 hours. BNP (last 3 results) No results for input(s): BNP in the last 8760 hours.  ProBNP (last 3 results) No results for input(s): PROBNP in the last 8760 hours.  CBG: No results for input(s): GLUCAP in the last 168 hours.  No results found for this or any previous visit  (from the past 240 hour(s)).   Studies: Vas [place] Lower Extremity Venous (dvt)  Result Date: 06/01/2018  Lower Venous Study Indications: Pulmonary embolism.  Risk Factors: Confirmed PE tobacco use. Anticoagulation: Heparin. Performing Technologist: Darlina Sicilian, M  Examination Guidelines: A complete evaluation includes B-mode imaging, spectral Doppler, color Doppler, and power Doppler as needed of all accessible portions of each vessel. Bilateral testing is considered an integral part of a complete examination. Limited examinations for reoccurring indications may be performed as noted.  Right Venous Findings: +---------+---------------+---------+-----------+----------+-------+          CompressibilityPhasicitySpontaneityPropertiesSummary +---------+---------------+---------+-----------+----------+-------+ CFV      Full           Yes      Yes                          +---------+---------------+---------+-----------+----------+-------+ SFJ      Full                                                 +---------+---------------+---------+-----------+----------+-------+ FV Prox  Full           Yes      Yes                          +---------+---------------+---------+-----------+----------+-------+ FV Mid   Full           Yes      Yes                          +---------+---------------+---------+-----------+----------+-------+ FV DistalFull           Yes      Yes                          +---------+---------------+---------+-----------+----------+-------+ POP      Full           Yes      Yes                          +---------+---------------+---------+-----------+----------+-------+ PTV      Full           Yes      Yes                          +---------+---------------+---------+-----------+----------+-------+ PERO     Full           Yes      Yes                          +---------+---------------+---------+-----------+----------+-------+ Gastroc  Full                                                  +---------+---------------+---------+-----------+----------+-------+  Left Venous Findings: +---------+---------------+---------+-----------+------------------+-------+          CompressibilityPhasicitySpontaneityProperties        Summary +---------+---------------+---------+-----------+------------------+-------+ CFV      Full           Yes      Yes                                  +---------+---------------+---------+-----------+------------------+-------+ SFJ      Full                                                         +---------+---------------+---------+-----------+------------------+-------+ FV Prox  Partial        Yes      Yes        softly echogenic          +---------+---------------+---------+-----------+------------------+-------+ FV Mid   None           No       No         softly echogenic          +---------+---------------+---------+-----------+------------------+-------+ FV DistalNone           No       No         softly echogenic          +---------+---------------+---------+-----------+------------------+-------+ POP      None           Yes      Yes        brightly echogenic        +---------+---------------+---------+-----------+------------------+-------+ PTV      None                               softly echogenic          +---------+---------------+---------+-----------+------------------+-------+ PERO     Full                                                         +---------+---------------+---------+-----------+------------------+-------+    Summary: Right: There is no evidence of deep vein thrombosis in the lower extremity. No cystic structure found in the popliteal fossa. Left: Findings consistent with acute deep vein thrombosis involving the left femoral vein, left popliteal vein, and left posterior tibial vein. No cystic structure found in the popliteal fossa.  *See  table(s) above for measurements and observations. Electronically signed by Servando Snare MD on 06/01/2018 at 8:55:07 PM.    Final     Scheduled Meds: . amLODipine  2.5 mg Oral Daily  . azithromycin  500 mg Oral Daily  . fluticasone  1 spray Each Nare BID  . gabapentin  300 mg Oral BID  . ipratropium-albuterol  3 mL Nebulization Q6H  . loratadine  10 mg  Oral Daily  . Melatonin  3 mg Oral QHS  . methylPREDNISolone (SOLU-MEDROL) injection  125 mg Intravenous Q12H  . montelukast  10 mg Oral QHS  . tamsulosin  0.4 mg Oral QPC supper    Continuous Infusions: . sodium chloride 75 mL/hr at 06/02/18 0827  . heparin 1,200 Units/hr (06/01/18 2238)     Time spent: 69mins I have personally reviewed and interpreted on  06/02/2018 daily labs, tele strips, imagings as discussed above under date review session and assessment and plans.  I reviewed all nursing notes, pharmacy notes,  vitals, pertinent old records  I have discussed plan of care as described above with RN , patient and family on 06/02/2018   Florencia Reasons MD, PhD  [facility] Pager [phone]. If 7PM-7AM, please contact night-coverage at [email or web address], password [facility] 06/02/2018, 11:43 AM  LOS: 2 days

## 2018-06-02 NOTE — Evaluation (Signed)
Occupational Therapy Evaluation Patient Details Name: Gregory Christensen MRN: [id number] DOB: 04-19-1951 Today's Date: 06/02/2018    History of Present Illness arrell Brossart is a 67 y.o. male with medical history significant for COPD not on home O2, former 26-year pack history who recently quit tobacco use, arthritis chronic low back pain admitted due to SOB R side pain found to be positive for R PE and L LE DVT.   Clinical Impression   Pt admitted with SOB. Pt currently with functional limitations due to the deficits listed below (see OT Problem List).  Pt will benefit from skilled OT to increase their safety and independence with ADL and functional mobility for ADL to facilitate discharge to venue listed below.      Follow Up Recommendations  No OT follow up    Equipment Recommendations  None recommended by OT    Recommendations for Other Services       Precautions / Restrictions Precautions Precautions: Fall      Mobility Bed Mobility Overal bed mobility: Modified Independent                Transfers Overall transfer level: Needs assistance Equipment used: None(IV pole) Transfers: Sit to/from Stand;Stand Pivot Transfers Sit to Stand: Supervision Stand pivot transfers: Supervision            Balance Overall balance assessment: No apparent balance deficits (not formally assessed)                                         ADL either performed or assessed with clinical judgement   ADL Overall ADL's : Needs assistance/impaired Eating/Feeding: Independent   Grooming: Supervision/safety;Standing;Wash/dry face   Upper Body Bathing: Set up;Sitting   Lower Body Bathing: Minimal assistance;Cueing for safety;Sit to/from stand   Upper Body Dressing : Set up;Sitting   Lower Body Dressing: Minimal assistance;Cueing for safety;Sit to/from stand   Toilet Transfer: Min guard;Comfort height toilet;Ambulation Toilet Transfer Details (indicate cue type and  reason): IV pole Toileting- Clothing Manipulation and Hygiene: Min guard;Sit to/from stand         General ADL Comments: VC for energy conservation and to take brakes as needed.  Pt did agree to go in bathroom rather than [facility]     Vision         Perception     Praxis      Pertinent Vitals/Pain Pain Assessment: No/denies pain           Communication Communication Communication: No difficulties   Cognition Arousal/Alertness: Awake/alert Behavior During Therapy: WFL for tasks assessed/performed Overall Cognitive Status: Within Functional Limits for tasks assessed                                                Home Living Family/patient expects to be discharged to:: Private residence Living Arrangements: Parent Available Help at Discharge: Family;Available PRN/intermittently Type of Home: House Home Access: Stairs to enter [employer] of Steps: 1 Entrance Stairs-Rails: None Home Layout: One level               Home Equipment: Cane - single point(mother has multiple walkers, seat in shower, etc)          Prior Functioning/Environment Level of Independence: Independent with assistive device(s)  Comments: uses cane sometimes. can walk without it.        OT Problem List: Cardiopulmonary status limiting activity      OT Treatment/Interventions: Self-care/ADL training;Patient/family education;Therapeutic activities;Energy conservation    OT Goals(Current goals can be found in the care plan section) Acute Rehab OT Goals Patient Stated Goal: home to watch [employer] OT Goal Formulation: With patient Time For Goal Achievement: 06/16/18 Potential to Achieve Goals: Good  OT Frequency: Min 2X/week   Barriers to D/C: Decreased caregiver support          Co-evaluation              AM-PAC OT "6 Clicks" Daily Activity     Outcome Measure Help from another person eating meals?: None Help from another person taking  care of personal grooming?: None Help from another person toileting, which includes using toliet, bedpan, or urinal?: A Little Help from another person bathing (including washing, rinsing, drying)?: A Little Help from another person to put on and taking off regular upper body clothing?: None Help from another person to put on and taking off regular lower body clothing?: A Little 6 Click Score: 21   End of Session Equipment Utilized During Treatment: Oxygen Nurse Communication: Mobility status  Activity Tolerance: Patient limited by fatigue Patient left: in chair  OT Visit Diagnosis: Unsteadiness on feet (R26.81)                Time: 4742-5956 OT Time Calculation (min): 18 min Charges:  OT General Charges $OT Visit: 1 Visit OT Evaluation $OT Eval Low Complexity: 1 Low  ;[name], OT Acute Rehabilitation Services [phone] Office- [phone]     Shakiyla Kook, Edwena Felty D 06/02/2018, 4:28 PM

## 2018-06-02 NOTE — Plan of Care (Signed)

## 2018-06-03 DIAGNOSIS — I82402 Acute embolism and thrombosis of unspecified deep veins of left lower extremity: Secondary | ICD-10-CM

## 2018-06-03 LAB — CBC
HCT: 35.2 % — ABNORMAL LOW (ref 39.0–52.0)
Hemoglobin: 11 g/dL — ABNORMAL LOW (ref 13.0–17.0)
MCH: 28.9 pg (ref 26.0–34.0)
MCHC: 31.3 g/dL (ref 30.0–36.0)
MCV: 92.4 fL (ref 80.0–100.0)
NRBC: 0 % (ref 0.0–0.2)
Platelets: 207 10*3/uL (ref 150–400)
RBC: 3.81 MIL/uL — AB (ref 4.22–5.81)
RDW: 13.6 % (ref 11.5–15.5)
WBC: 11.8 10*3/uL — ABNORMAL HIGH (ref 4.0–10.5)

## 2018-06-03 LAB — HEPARIN LEVEL (UNFRACTIONATED): Heparin Unfractionated: 0.39 IU/mL (ref 0.30–0.70)

## 2018-06-03 LAB — BASIC METABOLIC PANEL
ANION GAP: 8 (ref 5–15)
BUN: 16 mg/dL (ref 8–23)
CO2: 22 mmol/L (ref 22–32)
Calcium: 8.6 mg/dL — ABNORMAL LOW (ref 8.9–10.3)
Chloride: 107 mmol/L (ref 98–111)
Creatinine, Ser: 1.02 mg/dL (ref 0.61–1.24)
GFR calc non Af Amer: >60 mL/min (ref >60)
Glucose, Bld: 165 mg/dL — ABNORMAL HIGH (ref 70–99)
Potassium: 4 mmol/L (ref 3.5–5.1)
SODIUM: 137 mmol/L (ref 135–145)

## 2018-06-03 LAB — PSA: Prostatic Specific Antigen: 2.82 ng/mL (ref 0.00–4.00)

## 2018-06-03 MED ORDER — APIXABAN 5 MG PO TABS
10.0000 mg | ORAL_TABLET | Freq: Two times a day (BID) | ORAL | Status: DC
  Administered 2018-06-03 – 2018-06-04 (×3): 10 mg via ORAL
  Filled 2018-06-03 (×3): qty 2

## 2018-06-03 MED ORDER — IPRATROPIUM-ALBUTEROL 0.5-2.5 (3) MG/3ML IN SOLN
3.0000 mL | Freq: Three times a day (TID) | RESPIRATORY_TRACT | Status: DC
  Administered 2018-06-03 – 2018-06-04 (×2): 3 mL via RESPIRATORY_TRACT
  Filled 2018-06-03 (×2): qty 3

## 2018-06-03 MED ORDER — SODIUM CHLORIDE 0.9 % IV SOLN
1.0000 g | INTRAVENOUS | Status: DC
  Administered 2018-06-03: 1 g via INTRAVENOUS
  Filled 2018-06-03: qty 1
  Filled 2018-06-03: qty 10

## 2018-06-03 NOTE — Discharge Instructions (Signed)
Information on my medicine - ELIQUIS (apixaban)  This medication education was reviewed with me or my healthcare representative as part of my discharge preparation.  The pharmacist that spoke with me during my hospital stay was:  Avontae Burkhead, [facility]  Why was Eliquis prescribed for you? Eliquis was prescribed to treat blood clots that may have been found in the veins of your legs (deep vein thrombosis) or in your lungs (pulmonary embolism) and to reduce the risk of them occurring again.  What do You need to know about Eliquis ? The starting dose is 10 mg (two 5 mg tablets) taken TWICE daily for the FIRST SEVEN (7) DAYS, then on (enter date) 06/10/2018  the dose is reduced to ONE 5 mg tablet taken TWICE daily.  Eliquis may be taken with or without food.   Try to take the dose about the same time in the morning and in the evening. If you have difficulty swallowing the tablet whole please discuss with your pharmacist how to take the medication safely.  Take Eliquis exactly as prescribed and DO NOT stop taking Eliquis without talking to the doctor who prescribed the medication.  Stopping may increase your risk of developing a new blood clot.  Refill your prescription before you run out.  After discharge, you should have regular check-up appointments with your healthcare provider that is prescribing your Eliquis.    What do you do if you miss a dose? If a dose of ELIQUIS is not taken at the scheduled time, take it as soon as possible on the same day and twice-daily administration should be resumed. The dose should not be doubled to make up for a missed dose.  Important Safety Information A possible side effect of Eliquis is bleeding. You should call your healthcare provider right away if you experience any of the following: ? Bleeding from an injury or your nose that does not stop. ? Unusual colored urine (red or dark brown) or unusual colored stools (red or black). ? Unusual bruising  for unknown reasons. ? A serious fall or if you hit your head (even if there is no bleeding).  Some medicines may interact with Eliquis and might increase your risk of bleeding or clotting while on Eliquis. To help avoid this, consult your healthcare provider or pharmacist prior to using any new prescription or non-prescription medications, including herbals, vitamins, non-steroidal anti-inflammatory drugs (NSAIDs) and supplements.  This website has more information on Eliquis (apixaban): http://[email or web address].com/eliquis/home

## 2018-06-03 NOTE — Progress Notes (Signed)
ANTICOAGULATION CONSULT NOTE - Follow Up Consult  Pharmacy Consult for Heparin >> Eliquis  Indication: pulmonary embolus /DVT  No Known Allergies  Patient Measurements: Height: 6\' 1"  (185.4 cm) Weight: 159 lb (72.1 kg) IBW/kg (Calculated) : 79.9 Heparin Dosing Weight: TBW  Vital Signs: Temp: 98.4 F (36.9 C) (12/03 0533) Temp Source: Oral (12/03 0533) BP: 156/91 (12/03 0533) Pulse Rate: 71 (12/03 0533)  Labs: Recent Labs    06/01/18 0549  06/01/18 1603 06/02/18 0554 06/03/18 0549  HGB 12.6*  --   --  11.1* 11.0*  HCT 41.0  --   --  35.4* 35.2*  PLT 232  --   --  229 207  HEPARINUNFRC  --    < > 0.62 0.55 0.39  CREATININE 1.20  --   --  1.25* 1.02   < > = values in this interval not displayed.    Estimated Creatinine Clearance: 72.6 mL/min (by C-G formula based on SCr of 1.02 mg/dL).   Medications:  Infusions:    Assessment: 61 yoM admitted on 11/30 with acute onset of right-sided chest pain 05/31/18. CTA revealed PE.  PMH of COPD, arthritis, of note recent CTA (05/02/18) did not show any evidence of PE.  No PTA anticoagulation.  Pharmacy is consulted to dose Heparin IV.  Baseline APTT and INR WNL.  Today, 06/03/2018:  Heparin level is therapeutic at 0.39 on 1200 units/hr.  CBC:  Hgb 11.1, plt 207   Scr 1.02 mg/dl, improved, WNL   Liver function WNL   No bleeding or complications reported by RN  Goal of Therapy:  Heparin level 0.3-0.7 units/ml Monitor platelets by anticoagulation protocol: Yes   Plan:   Stop heparin and start apixaban 10 mg PO BID for 7 days, then apixaban 5 mg PO BID   Suggest stopping diclofenac upon discharge due to increased bleeding risk   Follow CBC, renal function   Monitor for signs and symptoms of bleeding   Royetta Asal, PharmD, BCPS Pager [phone] 06/03/2018 9:16 AM

## 2018-06-03 NOTE — Progress Notes (Signed)
SATURATION QUALIFICATIONS: (This note is used to comply with regulatory documentation for home oxygen)  Patient Saturations on Room Air at Rest = 90 %  Patient Saturations on Room Air while Ambulating =88%  Patient Saturations on 2 Liters of oxygen while Ambulating =95 %  Please briefly explain why patient needs home oxygen:Increased shortness of breath and increased heart rate with ambulation

## 2018-06-03 NOTE — Care Management Note (Signed)
Case Management Note  Patient Details  Name: Gregory Christensen MRN: [id number] Date of Birth: [birth year]  Subjective/Objective: PE, COPD. Qualifies for home 02-await home 02 order,& d/c. Benefit check for eliquis-await response.                   Action/Plan:dc home.  Expected Discharge Date:  06/03/18               Expected Discharge Plan:  Home/Self Care  In-House Referral:     Discharge planning Services  CM Consult  Post Acute Care Choice:    Choice offered to:     DME Arranged:    DME Agency:     HH Arranged:    HH Agency:     Status of Service:  In process, will continue to follow  If discussed at Long Length of Stay Meetings, dates discussed:    Additional Comments:  Dessa Phi, RN 06/03/2018, 10:50 AM

## 2018-06-03 NOTE — Consult Note (Addendum)
   [facility] CM Inpatient Consult   06/03/2018  Gregory Christensen Jan 13, 1951 [id number]    Patient screened for potential [facility] Care Management program services.  Chart reviewed. Noted patient is eligible/enrolled in the [place]. This program provides case management services. Therefore, [facility] Care Management not appropriate at this time.  Discussed with inpatient RNCM.   Marthenia Rolling, MSN-Ed, RN,BSN [facility] Liaison [phone]

## 2018-06-03 NOTE — Progress Notes (Signed)
Occupational Therapy Treatment Patient Details Name: Gregory Christensen MRN: [id number] DOB: 1950-12-26 Today's Date: 06/03/2018    History of present illness Mr.  Gregory Christensen is a 67 y.o. male with medical history significant for COPD not on home O2, former 26-year pack history who recently quit tobacco use, arthritis chronic low back pain admitted due to SOB R side pain found to be positive for R PE and L LE DVT.      Follow Up Recommendations  No OT follow up    Equipment Recommendations  None recommended by OT    Recommendations for Other Services      Precautions / Restrictions Precautions Precautions: Fall       Mobility Bed Mobility Overal bed mobility: Modified Independent                Transfers Overall transfer level: Needs assistance Equipment used: None(IV pole) Transfers: Sit to/from Stand;Stand Pivot Transfers Sit to Stand: Supervision Stand pivot transfers: Supervision            Balance Overall balance assessment: No apparent balance deficits (not formally assessed)                                         ADL either performed or assessed with clinical judgement   ADL Overall ADL's : Needs assistance/impaired Eating/Feeding: Independent   Grooming: Supervision/safety;Standing;Wash/dry face   Upper Body Bathing: Sitting;Supervision/ safety   Lower Body Bathing: Cueing for safety;Cueing for sequencing;Supervison/ safety   Upper Body Dressing : Supervision/safety;Sitting   Lower Body Dressing: Sit to/from stand;Supervision/safety   Toilet Transfer: Comfort height toilet;Ambulation;Supervision/safety Toilet Transfer Details (indicate cue type and reason): IV pole Toileting- Clothing Manipulation and Hygiene: Sit to/from stand;Supervision/safety       Functional mobility during ADLs: Supervision/safety General ADL Comments: Pt agreed to 2 sets of sit to stand ( 5 reps)  Oxygen sats stayed 99 but pt wheezing- RN aware      Vision Patient Visual Report: No change from baseline            Cognition Arousal/Alertness: Awake/alert Behavior During Therapy: WFL for tasks assessed/performed Overall Cognitive Status: Within Functional Limits for tasks assessed                                                          Frequency  Min 2X/week        Progress Toward Goals  OT Goals(current goals can now be found in the care plan section)  Progress towards OT goals: Progressing toward goals     Plan Discharge plan remains appropriate    Co-evaluation                 AM-PAC OT "6 Clicks" Daily Activity     Outcome Measure   Help from another person eating meals?: None Help from another person taking care of personal grooming?: None Help from another person toileting, which includes using toliet, bedpan, or urinal?: A Little Help from another person bathing (including washing, rinsing, drying)?: A Little Help from another person to put on and taking off regular upper body clothing?: None Help from another person to put on and taking off regular lower body clothing?: A Little 6 Click Score: 21  End of Session Equipment Utilized During Treatment: Oxygen  OT Visit Diagnosis: Unsteadiness on feet (R26.81)   Activity Tolerance Patient limited by fatigue   Patient Left in chair   Nurse Communication Mobility status        Time: 6751-9824 OT Time Calculation (min): 10 min  Charges: OT General Charges $OT Visit: 1 Visit OT Treatments $Self Care/Home Management : 8-22 mins  Gregory Christensen, [place] [phone] Office- [phone]      Gregory Christensen, Gregory Christensen 06/03/2018, 2:19 PM

## 2018-06-03 NOTE — Plan of Care (Signed)
  Problem: Clinical Measurements: Goal: Ability to maintain clinical measurements within normal limits will improve Outcome: Progressing Goal: Will remain free from infection Outcome: Progressing Goal: Respiratory complications will improve Outcome: Progressing   

## 2018-06-03 NOTE — Progress Notes (Signed)
PROGRESS NOTE  Gregory Christensen NWG:[id number] DOB: 1951/05/18 DOA: 05/31/2018 PCP: Leighton Ruff, MD  Brief Summary:   Pt presents with right-sided chest pain, wheezing ,coughing, shortness of breath  Found to have PE/DVT and copd exacerbation   HPI/Recap of past 24 hours:  Continue to have significant Wheezing,  chest pain and left leg pain has imprvoed, no hypoxia on 2liter at rest No fever   Assessment/Plan: Principal Problem:   Pulmonary embolism (HCC) Active Problems:   COPD exacerbation ([place])   Hypoxia   Respiratory distress   Essential hypertension   Acute hypoxemic respiratory failure (HCC)   Acute hypoxic respiratory failure -Due to PE and COPD exacerbation -Report baseline not O2 dependent, suspect will need home O2 -will need to ambulate to check o2 sats tomorow  Acute PE/DVT -Reports h/o left leg edema in 12/2017 , venous US "DVT located in the left proximal femoral vein to the popliteal vein and also extends to the Posterior Tibial Vein." -troponin negative -echocardiogram lvef wnl, grade I diastolic dysfunction, right ventrical normal in size -he is started on heparin drip on admission, transition to eliquis  On 12/3  Tele unremarkable, will d/c tele.  COPD exacerbation -[facility] -He run out his nebs meds in 07/2017, request refills prior to discharge -continue to have diffuse wheezing, continue steroid, zithromax, scheduled nebs, add on rocephin suspect inadequate abx coverage  AKI on CKDII Cr peaked at 1.25, trending down with gentle hydration   d/c home meds diclofenac repeat bmp in am, renal dosing meds  Mild left hydronephrosis of unknown significance. Denies flank pain, denies urinary symptom PVR 73cc 7mins after voiding 450cc Cr unremarkable Continue home meds flomax Per daughter's reports, patient has prostate cancer, he is [facility] at [facility], he has an appointment with urology  December. 9th psa  2.82   Smoking Cessation education provided  Alcohol Denies h/o alcohol withdrawal ,  No withdrawal symptom so far  FTT: did well with PT eval   Code Status: DNR  Family Communication: patient and daughter at bedside on 12/1 and 12/2  Disposition Plan: home , hopefully tomorrow,  his [birth year] is this [birth year] Likely will need  home o2 when medically ready to discharge   Consultants:  none  Procedures:  none  Antibiotics:  zithromax/rocephin   Objective: BP (!) 155/88   Pulse 71   Temp 98.4 F (36.9 C) (Oral)   Resp 20   Ht 6\' 1"  (1.854 m)   Wt 72.1 kg   SpO2 96%   BMI 20.98 kg/m   Intake/Output Summary (Last 24 hours) at 06/03/2018 1012 Last data filed at 06/03/2018 0915 Gross per 24 hour  Intake 2030.47 ml  Output 2150 ml  Net -119.53 ml   Filed Weights   06/01/18 0602 06/02/18 0707 06/03/18 0533  Weight: 70.5 kg 71.3 kg 72.1 kg    Exam: Patient is examined daily including today on 06/03/2018, exams remain the same as of yesterday except that has changed    General:  NAD  Cardiovascular: RRR  Respiratory: diffuse wheezing bilaterally  Abdomen: Soft/ND/NT, positive BS  Musculoskeletal: No Edema  Neuro: alert, oriented   Data Reviewed: Basic Metabolic Panel: Recent Labs  Lab 05/31/18 0827 06/01/18 0549 06/02/18 0554 06/03/18 0549  NA 142 139 138 137  K 4.0 4.7 4.2 4.0  CL 109 109 107 107  CO2 24 21* 21* 22  GLUCOSE 98 180* 172* 165*  BUN 14 17 18 16   CREATININE 1.13  1.20 1.25* 1.02  CALCIUM 9.1 9.1 8.8* 8.6*   Liver Function Tests: Recent Labs  Lab 05/31/18 0827  AST 19  ALT 18  ALKPHOS 81  BILITOT 0.7  PROT 8.2*  ALBUMIN 3.8   No results for input(s): LIPASE, AMYLASE in the last 168 hours. No results for input(s): AMMONIA in the last 168 hours. CBC: Recent Labs  Lab 05/31/18 0827 06/01/18 0549 06/02/18 0554 06/03/18 0549  WBC 7.9 11.1* 10.1 11.8*  NEUTROABS 5.1  --   --   --   HGB 13.9 12.6* 11.1* 11.0*   HCT 44.2 41.0 35.4* 35.2*  MCV 95.1 94.9 90.3 92.4  PLT 234 232 229 207   Cardiac Enzymes:   No results for input(s): CKTOTAL, CKMB, CKMBINDEX, TROPONINI in the last 168 hours. BNP (last 3 results) No results for input(s): BNP in the last 8760 hours.  ProBNP (last 3 results) No results for input(s): PROBNP in the last 8760 hours.  CBG: No results for input(s): GLUCAP in the last 168 hours.  No results found for this or any previous visit (from the past 240 hour(s)).   Studies: No results found.  Scheduled Meds: . amLODipine  2.5 mg Oral Daily  . apixaban  10 mg Oral BID  . azithromycin  500 mg Oral Daily  . fluticasone  1 spray Each Nare BID  . gabapentin  300 mg Oral BID  . guaiFENesin  600 mg Oral BID  . ipratropium-albuterol  3 mL Nebulization Q6H  . loratadine  10 mg Oral Daily  . Melatonin  3 mg Oral QHS  . methylPREDNISolone (SOLU-MEDROL) injection  125 mg Intravenous Q12H  . montelukast  10 mg Oral QHS  . tamsulosin  0.4 mg Oral QPC supper    Continuous Infusions:    Time spent: 44mins I have personally reviewed and interpreted on  06/03/2018 daily labs, tele strips, imagings as discussed above under date review session and assessment and plans.  I reviewed all nursing notes, pharmacy notes,  vitals, pertinent old records  I have discussed plan of care as described above with RN , patient  on 06/03/2018   Florencia Reasons MD, PhD  [facility] Pager [phone]. If 7PM-7AM, please contact night-coverage at [email or web address], password [facility] 06/03/2018, 10:12 AM  LOS: 3 days

## 2018-06-04 LAB — BASIC METABOLIC PANEL
Anion gap: 9 (ref 5–15)
BUN: 19 mg/dL (ref 8–23)
CO2: 27 mmol/L (ref 22–32)
Calcium: 9.2 mg/dL (ref 8.9–10.3)
Chloride: 101 mmol/L (ref 98–111)
Creatinine, Ser: 1.08 mg/dL (ref 0.61–1.24)
GFR calc non Af Amer: >60 mL/min (ref >60)
Glucose, Bld: 160 mg/dL — ABNORMAL HIGH (ref 70–99)
Potassium: 4 mmol/L (ref 3.5–5.1)
Sodium: 137 mmol/L (ref 135–145)

## 2018-06-04 LAB — CBC
HCT: 40.4 % (ref 39.0–52.0)
HEMOGLOBIN: 12.8 g/dL — AB (ref 13.0–17.0)
MCH: 29.1 pg (ref 26.0–34.0)
MCHC: 31.7 g/dL (ref 30.0–36.0)
MCV: 91.8 fL (ref 80.0–100.0)
Platelets: 208 10*3/uL (ref 150–400)
RBC: 4.4 MIL/uL (ref 4.22–5.81)
RDW: 13.4 % (ref 11.5–15.5)
WBC: 12 10*3/uL — ABNORMAL HIGH (ref 4.0–10.5)
nRBC: 0 % (ref 0.0–0.2)

## 2018-06-04 LAB — MAGNESIUM: Magnesium: 2.4 mg/dL (ref 1.7–2.4)

## 2018-06-04 MED ORDER — AMOXICILLIN-POT CLAVULANATE 875-125 MG PO TABS: 1.0000 | ORAL_TABLET | Freq: Two times a day (BID) | ORAL | 0 refills | Status: AC

## 2018-06-04 MED ORDER — PREDNISONE 20 MG PO TABS: 40.0000 mg | ORAL_TABLET | Freq: Every day | ORAL | 0 refills | Status: AC

## 2018-06-04 MED ORDER — ALBUTEROL SULFATE (2.5 MG/3ML) 0.083% IN NEBU: 2.5000 mg | INHALATION_SOLUTION | Freq: Four times a day (QID) | RESPIRATORY_TRACT | 12 refills | Status: DC | PRN

## 2018-06-04 MED ORDER — APIXABAN 5 MG PO TABS: 5.0000 mg | ORAL_TABLET | Freq: Two times a day (BID) | ORAL | 1 refills | Status: DC

## 2018-06-04 MED ORDER — GUAIFENESIN ER 600 MG PO TB12: 600.0000 mg | ORAL_TABLET | Freq: Two times a day (BID) | ORAL | 0 refills | Status: DC

## 2018-06-04 MED ORDER — MONTELUKAST SODIUM 10 MG PO TABS: 10.0000 mg | ORAL_TABLET | Freq: Every day | ORAL | 0 refills | Status: DC

## 2018-06-04 MED ORDER — APIXABAN 5 MG PO TABS: 10.0000 mg | ORAL_TABLET | Freq: Two times a day (BID) | ORAL | 0 refills | Status: DC

## 2018-06-04 NOTE — Care Management Important Message (Signed)
Important Message  Patient Details  Name: Stephane Niemann MRN: [id number] Date of Birth: 07/08/1950   Medicare Important Message Given:  Yes    Kerin Salen 06/04/2018, 11:59 AMImportant Message  Patient Details  Name: Ryota Treece MRN: [id number] Date of Birth: 28-Nov-1950   Medicare Important Message Given:  Yes    Kerin Salen 06/04/2018, 11:59 AM

## 2018-06-04 NOTE — Plan of Care (Signed)
Plan of care reviewed and discussed with the patient. 

## 2018-06-04 NOTE — Progress Notes (Signed)
Talked with pts daughter about his medication and Eliquis schedule. All questions answered concerning medications and pt's discharge.

## 2018-06-04 NOTE — Care Management Note (Signed)
Case Management Note  Patient Details  Name: Gregory Christensen MRN: [id number] Date of Birth: 1950/09/01  Subjective/Objective: Home 02 ordered-[facility] rep Santiago Glad aware of d/c, & home 02 to deliver to rm prior d/c.                   Action/Plan:dc home w/home 02   Expected Discharge Date:  06/03/18               Expected Discharge Plan:  Home/Self Care  In-House Referral:     Discharge planning Services  CM Consult  Post Acute Care Choice:    Choice offered to:  Patient  DME Arranged:  Oxygen DME Agency:  [place]:    [facility] Agency:     Status of Service:  Completed, signed off  If discussed at [place] of Stay Meetings, dates discussed:    Additional Comments:  Dessa Phi, RN 06/04/2018, 12:18 PM

## 2018-06-04 NOTE — Discharge Summary (Signed)
Physician Discharge Summary  Haytham Rinehimer RKY:[id number] DOB: [birth year] DOA: 05/31/2018  PCP: Leighton Ruff, MD  Admit date: 05/31/2018 Discharge date: 06/04/2018  Admitted From: Home  Disposition:  Home   Recommendations for Outpatient Follow-up:  1. Follow up with PCP in 1-2 weeks 2. Please obtain BMP/CBC in one week 3. Follow up. with urology   Home Health: yes Equipment/Devices: Oxygen.   Discharge Condition: stable.  CODE STATUS: full code.  Diet recommendation: Heart Healthy  Brief/Interim Summary: Pt presents withright-sided chest pain, wheezing ,coughing, shortness of breath, Found to have PE/DVT and copd exacerbation.  He was started on IV heparin to treat acute PE.  Subsequently he was transitioned to Eliquis.  Prior to his status has remained stable. She was also noticed to have COPD exacerbation, give IV Solu-Medrol, nebulizer treatment.  Day of discharge he is feeling better no significant wheezing on lung exam.  he will be discharged on prednisone taper  Acute hypoxic respiratory failure -Due to PE and COPD exacerbation -Report baseline not O2 dependent, suspect will need home O2 Continue to have hypoxemia on ambulation.  Will arrange home oxygen.  his breathing almost back to baseline  Acute PE/DVT -Reports h/o left leg edema in 12/2017 , venous US "DVT located in the left proximal femoral vein to the popliteal vein and also extends to the Posterior Tibial Vein." -troponin negative -echocardiogram lvef wnl, grade I diastolic dysfunction, right ventrical normal in size -he is started on heparin drip on admission, transition to eliquis  On 12/3 He has remained stable plan to discharge today.  [facility] exacerbation -[facility] -He run out his nebs meds in 07/2017, request refills prior to discharge -Improved he will be discharged on prednisone, nebulizer treatment.  Augmentin.  AKI on CKDII Cr peaked at 1.25, trending down with gentle  hydration   d/c home meds diclofenac repeat bmp in am, renal dosing meds Resolved.  Mild left hydronephrosis of unknown significance. Denies flank pain, denies urinary symptom PVR 73cc 18mins after voiding 450cc Cr unremarkable Continue home meds flomax Per daughter's reports, patient has prostate cancer, he is followed by urology at [facility], he has an appointment with urology  December. 9th psa 2.82 Needs to follow-up with urology  Smoking Cessation education provided  Alcohol Denies h/o alcohol withdrawal ,  No withdrawal symptom so far  FTT: did well with PT eval  Discharge Diagnoses:  Principal Problem:   Pulmonary embolism ([place]) Active Problems:   COPD exacerbation ([place])   Hypoxia   Respiratory distress   Essential hypertension   Acute hypoxemic respiratory failure ([facility])   Acute deep vein thrombosis (DVT) of left lower extremity ([place])    Discharge Instructions   Allergies as of 06/04/2018   No Known Allergies     Medication List    STOP taking these medications   azithromycin 250 MG tablet Commonly known as:  ZITHROMAX   diclofenac 75 MG EC tablet Commonly known as:  VOLTAREN   PROAIR HFA IN Replaced by:  albuterol (2.5 MG/3ML) 0.083% nebulizer solution You also have another medication with the same name that you need to continue taking as instructed.     TAKE these medications   acetaminophen 500 MG tablet Commonly known as:  TYLENOL Take 1,000 mg by mouth every 6 (six) hours as needed for moderate pain or headache.   albuterol 108 (90 Base) MCG/ACT inhaler Commonly known as:  PROVENTIL HFA;VENTOLIN HFA Inhale 2 puffs into the lungs every 2 (two) hours as  needed for wheezing or shortness of breath. What changed:    Another medication with the same name was added. Make sure you understand how and when to take each.  Another medication with the same name was removed. Continue taking this medication, and follow the directions you see here.    albuterol (2.5 MG/3ML) 0.083% nebulizer solution Commonly known as:  PROVENTIL Take 3 mLs (2.5 mg total) by nebulization every 6 (six) hours as needed for wheezing or shortness of breath. What changed:  You were already taking a medication with the same name, and this prescription was added. Make sure you understand how and when to take each. Replaces:  PROAIR HFA IN   amLODipine 2.5 MG tablet Commonly known as:  NORVASC Take 2.5 mg by mouth daily. What changed:  Another medication with the same name was removed. Continue taking this medication, and follow the directions you see here.   amoxicillin-clavulanate 875-125 MG tablet Commonly known as:  AUGMENTIN Take 1 tablet by mouth 2 (two) times daily for 5 days.   apixaban 5 MG Tabs tablet Commonly known as:  ELIQUIS Take 2 tablets (10 mg total) by mouth 2 (two) times daily.   apixaban 5 MG Tabs tablet Commonly known as:  ELIQUIS Take 1 tablet (5 mg total) by mouth 2 (two) times daily. Start taking on:  06/10/2018   budesonide-formoterol 160-4.5 MCG/ACT inhaler Commonly known as:  SYMBICORT Inhale 1 puff into the lungs 2 (two) times daily.   cetirizine 10 MG tablet Commonly known as:  ZYRTEC Take 10 mg by mouth daily.   fluticasone 50 MCG/ACT nasal spray Commonly known as:  FLONASE Place 1 spray into both nostrils 2 (two) times daily.   gabapentin 300 MG capsule Commonly known as:  NEURONTIN Take 300 mg by mouth 2 (two) times daily.   guaiFENesin 600 MG 12 hr tablet Commonly known as:  MUCINEX Take 1 tablet (600 mg total) by mouth 2 (two) times daily.   Melatonin 3 MG Tabs Take 3 mg by mouth at bedtime.   methocarbamol 500 MG tablet Commonly known as:  ROBAXIN Take 500 mg by mouth at bedtime as needed for muscle spasms.   montelukast 10 MG tablet Commonly known as:  SINGULAIR Take 1 tablet (10 mg total) by mouth at bedtime.   predniSONE 20 MG tablet Commonly known as:  DELTASONE Take 2 tablets (40 mg total)  by mouth daily for 5 days. What changed:    medication strength  how much to take  when to take this  additional instructions   SPIRIVA RESPIMAT 1.25 MCG/ACT Aers Generic drug:  Tiotropium Bromide Monohydrate Inhale 2 puffs into the lungs daily.   tamsulosin 0.4 MG Caps capsule Commonly known as:  FLOMAX Take 0.4 mg by mouth daily after supper.            Durable Medical Equipment  (From admission, onward)         Start     Ordered   06/04/18 1211  For home use only DME oxygen  Once    Question Answer Comment  Mode or (Route) Nasal cannula   Liters per Minute 2   Frequency Continuous (stationary and portable oxygen unit needed)   Oxygen delivery system Gas      06/04/18 1211         Follow-up [place] Follow up.   Why:  home oxygen Contact information: [address] [place] [address]  [phone]          No Known Allergies  Consultations:  None   Procedures/Studies: Dg Chest 2 View  Result Date: 05/31/2018 CLINICAL DATA:  "sharp" right-sided chest pains at ~0300 hours today. He also tells me he has "COPD" and had a three day hospitalization for COPD at the beginning of this month. He also tells me he had a nebulizer at home "but I'm out of that medication". He states he is not O2 dependent. He is short of breath; but not dyspneic. EXAM: CHEST - 2 VIEW COMPARISON:  none FINDINGS: Lungs are clear. Heart size and mediastinal contours are within normal limits. No effusion.  No pneumothorax. Visualized bones unremarkable. IMPRESSION: No acute cardiopulmonary disease. Electronically Signed   By: Lucrezia Europe M.D.   On: 05/31/2018 09:06   Ct Angio Chest Pe W And/or Wo Contrast  Result Date: 05/31/2018 CLINICAL DATA:  Chest pain EXAM: CT ANGIOGRAPHY CHEST WITH CONTRAST TECHNIQUE: Multidetector CT imaging of the chest was performed using the standard protocol during bolus administration of intravenous  contrast. Multiplanar CT image reconstructions and MIPs were obtained to evaluate the vascular anatomy. CONTRAST:  128mL ISOVUE-370 IOPAMIDOL (ISOVUE-370) INJECTION 76% COMPARISON:  05/02/2018 FINDINGS: Cardiovascular: The study is positive for acute pulmonary thromboembolism. There is low-density filling defect extending from the right upper lobe lobar pulmonary artery branch extending into segmental branches. Pulmonary arterial tree is otherwise free of emboli. No evidence of aortic aneurysm. No obvious aortic dissection or acute intramural hematoma. Atherosclerotic vascular calcifications of the aorta and great vessels are noted. Mild 3 vessel coronary artery calcification. The heart is normal in size. Mediastinum/Nodes: No abnormal mediastinal adenopathy or pericardial effusion. Lungs/Pleura: Severe panlobular emphysema throughout the lungs. Reticulonodular opacities in the anterior right middle lobe and right upper lobe are likely related to scarring. No mass or consolidation. No pneumothorax. No pleural effusion. Upper Abdomen: Mild left hydronephrosis without delayed nephrogram phase is of unknown significance. Musculoskeletal: No vertebral compression deformity. C6-7 degenerative disc disease is partially imaged. Review of the MIP images confirms the above findings. IMPRESSION: The study is positive for lobar and segmental right upper lobe pulmonary thromboembolism. Critical Value/emergent results were called by telephone at the time of interpretation on 05/31/2018 at 10:29 am to Dr. Nanda Quinton , who verbally acknowledged these results. Mild left hydronephrosis of unknown significance. Aortic Atherosclerosis (ICD10-I70.0) and Emphysema (ICD10-J43.9). Electronically Signed   By: Marybelle Killings M.D.   On: 05/31/2018 10:29   Vas [place] Lower Extremity Venous (dvt)  Result Date: 06/01/2018  Lower Venous Study Indications: Pulmonary embolism.  Risk Factors: Confirmed PE tobacco use. Anticoagulation: Heparin.  Performing Technologist: Darlina Sicilian, M  Examination Guidelines: A complete evaluation includes B-mode imaging, spectral Doppler, color Doppler, and power Doppler as needed of all accessible portions of each vessel. Bilateral testing is considered an integral part of a complete examination. Limited examinations for reoccurring indications may be performed as noted.  Right Venous Findings: +---------+---------------+---------+-----------+----------+-------+          CompressibilityPhasicitySpontaneityPropertiesSummary +---------+---------------+---------+-----------+----------+-------+ CFV      Full           Yes      Yes                          +---------+---------------+---------+-----------+----------+-------+ SFJ      Full                                                 +---------+---------------+---------+-----------+----------+-------+  FV Prox  Full           Yes      Yes                          +---------+---------------+---------+-----------+----------+-------+ FV Mid   Full           Yes      Yes                          +---------+---------------+---------+-----------+----------+-------+ FV DistalFull           Yes      Yes                          +---------+---------------+---------+-----------+----------+-------+ POP      Full           Yes      Yes                          +---------+---------------+---------+-----------+----------+-------+ PTV      Full           Yes      Yes                          +---------+---------------+---------+-----------+----------+-------+ PERO     Full           Yes      Yes                          +---------+---------------+---------+-----------+----------+-------+ Gastroc  Full                                                 +---------+---------------+---------+-----------+----------+-------+  Left Venous Findings: +---------+---------------+---------+-----------+------------------+-------+           CompressibilityPhasicitySpontaneityProperties        Summary +---------+---------------+---------+-----------+------------------+-------+ CFV      Full           Yes      Yes                                  +---------+---------------+---------+-----------+------------------+-------+ SFJ      Full                                                         +---------+---------------+---------+-----------+------------------+-------+ FV Prox  Partial        Yes      Yes        softly echogenic          +---------+---------------+---------+-----------+------------------+-------+ FV Mid   None           No       No         softly echogenic          +---------+---------------+---------+-----------+------------------+-------+ FV DistalNone           No       No         softly echogenic          +---------+---------------+---------+-----------+------------------+-------+  POP      None           Yes      Yes        brightly echogenic        +---------+---------------+---------+-----------+------------------+-------+ PTV      None                               softly echogenic          +---------+---------------+---------+-----------+------------------+-------+ PERO     Full                                                         +---------+---------------+---------+-----------+------------------+-------+    Summary: Right: There is no evidence of deep vein thrombosis in the lower extremity. No cystic structure found in the popliteal fossa. Left: Findings consistent with acute deep vein thrombosis involving the left femoral vein, left popliteal vein, and left posterior tibial vein. No cystic structure found in the popliteal fossa.  *See table(s) above for measurements and observations. Electronically signed by Servando Snare MD on 06/01/2018 at 8:55:07 PM.    Final       Subjective: Breathing improved  Discharge Exam: Vitals:   06/04/18 0505 06/04/18 0746   BP: (!) 149/92   Pulse: 98   Resp: 18   Temp: 98.3 F (36.8 C)   SpO2: 98% 98%   Vitals:   06/03/18 2147 06/04/18 0505 06/04/18 0634 06/04/18 0746  BP: (!) 141/80 (!) 149/92    Pulse: 82 98    Resp: 18 18    Temp: 99 F (37.2 C) 98.3 F (36.8 C)    TempSrc: Oral Oral    SpO2: 100% 98%  98%  Weight:   73.7 kg   Height:        General: Pt is alert, awake, not in acute distress Cardiovascular: RRR, S1/S2 +, no rubs, no gallops Respiratory: CTA bilaterally, no significant wheezing Abdominal: Soft, NT, ND, bowel sounds + Extremities: no edema, no cyanosis    The results of significant diagnostics from this hospitalization (including imaging, microbiology, ancillary and laboratory) are listed below for reference.     Microbiology: No results found for this or any previous visit (from the past 240 hour(s)).   Labs: BNP (last 3 results) No results for input(s): BNP in the last 8760 hours. Basic Metabolic Panel: Recent Labs  Lab 05/31/18 0827 06/01/18 0549 06/02/18 0554 06/03/18 0549 06/04/18 0532  NA 142 139 138 137 137  K 4.0 4.7 4.2 4.0 4.0  CL 109 109 107 107 101  CO2 24 21* 21* 22 27  GLUCOSE 98 180* 172* 165* 160*  BUN 14 17 18 16 19   CREATININE 1.13 1.20 1.25* 1.02 1.08  CALCIUM 9.1 9.1 8.8* 8.6* 9.2  MG  --   --   --   --  2.4   Liver Function Tests: Recent Labs  Lab 05/31/18 0827  AST 19  ALT 18  ALKPHOS 81  BILITOT 0.7  PROT 8.2*  ALBUMIN 3.8   No results for input(s): LIPASE, AMYLASE in the last 168 hours. No results for input(s): AMMONIA in the last 168 hours. CBC: Recent Labs  Lab 05/31/18 0827 06/01/18 0549 06/02/18 0554 06/03/18 0549 06/04/18 0532  WBC 7.9 11.1* 10.1  11.8* 12.0*  NEUTROABS 5.1  --   --   --   --   HGB 13.9 12.6* 11.1* 11.0* 12.8*  HCT 44.2 41.0 35.4* 35.2* 40.4  MCV 95.1 94.9 90.3 92.4 91.8  PLT 234 232 229 207 208   Cardiac Enzymes: No results for input(s): CKTOTAL, CKMB, CKMBINDEX, TROPONINI in the last  168 hours. BNP: Invalid input(s): POCBNP CBG: No results for input(s): GLUCAP in the last 168 hours. D-Dimer No results for input(s): DDIMER in the last 72 hours. Hgb A1c No results for input(s): HGBA1C in the last 72 hours. Lipid Profile No results for input(s): CHOL, HDL, LDLCALC, TRIG, CHOLHDL, LDLDIRECT in the last 72 hours. Thyroid function studies Recent Labs    06/02/18 0554  TSH 0.466   Anemia work up No results for input(s): VITAMINB12, FOLATE, FERRITIN, TIBC, IRON, RETICCTPCT in the last 72 hours. Urinalysis    Component Value Date/Time   COLORURINE YELLOW 10/20/2017 0824   APPEARANCEUR CLEAR 10/20/2017 0824   LABSPEC 1.011 10/20/2017 0824   PHURINE 6.0 10/20/2017 0824   GLUCOSEU NEGATIVE 10/20/2017 0824   HGBUR SMALL (A) 10/20/2017 0824   BILIRUBINUR NEGATIVE 10/20/2017 0824   KETONESUR NEGATIVE 10/20/2017 0824   PROTEINUR NEGATIVE 10/20/2017 0824   NITRITE NEGATIVE 10/20/2017 0824   LEUKOCYTESUR LARGE (A) 10/20/2017 0824   Sepsis Labs Invalid input(s): PROCALCITONIN,  WBC,  LACTICIDVEN Microbiology No results found for this or any previous visit (from the past 240 hour(s)).   Time coordinating discharge: 35 minutes  SIGNED:   Elmarie Shiley, MD  [facility] 06/04/2018, 12:21 PM Pager   If 7PM-7AM, please contact night-coverage [email or web address] Password [email or web address]

## 2018-06-06 ENCOUNTER — Encounter (INDEPENDENT_AMBULATORY_CARE_PROVIDER_SITE_OTHER): Payer: Self-pay

## 2018-06-10 DIAGNOSIS — I82409 Acute embolism and thrombosis of unspecified deep veins of unspecified lower extremity: Secondary | ICD-10-CM | POA: Diagnosis not present

## 2018-06-10 DIAGNOSIS — J449 Chronic obstructive pulmonary disease, unspecified: Secondary | ICD-10-CM | POA: Diagnosis not present

## 2018-06-10 DIAGNOSIS — Z23 Encounter for immunization: Secondary | ICD-10-CM | POA: Diagnosis not present

## 2018-06-10 DIAGNOSIS — I2699 Other pulmonary embolism without acute cor pulmonale: Secondary | ICD-10-CM | POA: Diagnosis not present

## 2018-06-10 DIAGNOSIS — N179 Acute kidney failure, unspecified: Secondary | ICD-10-CM | POA: Diagnosis not present

## 2018-06-18 DIAGNOSIS — R7989 Other specified abnormal findings of blood chemistry: Secondary | ICD-10-CM | POA: Diagnosis not present

## 2018-06-18 DIAGNOSIS — E878 Other disorders of electrolyte and fluid balance, not elsewhere classified: Secondary | ICD-10-CM | POA: Diagnosis not present

## 2018-07-05 DIAGNOSIS — J441 Chronic obstructive pulmonary disease with (acute) exacerbation: Secondary | ICD-10-CM | POA: Diagnosis not present

## 2018-07-21 ENCOUNTER — Telehealth (HOSPITAL_COMMUNITY): Payer: Self-pay

## 2018-07-21 NOTE — Telephone Encounter (Signed)
Paper referral received from [place] with authorization number [id number] and diagnosis of emphysema, unspecified. Clinical review of pt follow up appt on 06/30/18 Pulmonary office note. Pt appropriate for scheduling for Pulmonary rehab.  Will forward to support staff for scheduling and verification of insurance eligibility/benefits with pt consent.   Joycelyn Man, RN, BSN Cardiac and Pulmonary Rehab Nurse

## 2018-08-01 ENCOUNTER — Emergency Department (HOSPITAL_COMMUNITY): Payer: Medicare HMO

## 2018-08-01 ENCOUNTER — Inpatient Hospital Stay (HOSPITAL_COMMUNITY)
Admission: EM | Admit: 2018-08-01 | Discharge: 2018-08-04 | DRG: 190 | Disposition: A | Discharge status: Home / Self Care | Payer: Medicare HMO | Attending: Internal Medicine | Admitting: Internal Medicine

## 2018-08-01 ENCOUNTER — Encounter (HOSPITAL_COMMUNITY): Payer: Self-pay

## 2018-08-01 DIAGNOSIS — J45909 Unspecified asthma, uncomplicated: Secondary | ICD-10-CM | POA: Diagnosis not present

## 2018-08-01 DIAGNOSIS — Z7951 Long term (current) use of inhaled steroids: Secondary | ICD-10-CM | POA: Diagnosis not present

## 2018-08-01 DIAGNOSIS — Z9981 Dependence on supplemental oxygen: Secondary | ICD-10-CM

## 2018-08-01 DIAGNOSIS — G8929 Other chronic pain: Secondary | ICD-10-CM | POA: Diagnosis present

## 2018-08-01 DIAGNOSIS — M1990 Unspecified osteoarthritis, unspecified site: Secondary | ICD-10-CM | POA: Diagnosis present

## 2018-08-01 DIAGNOSIS — Z79890 Hormone replacement therapy (postmenopausal): Secondary | ICD-10-CM | POA: Diagnosis not present

## 2018-08-01 DIAGNOSIS — J441 Chronic obstructive pulmonary disease with (acute) exacerbation: Secondary | ICD-10-CM | POA: Diagnosis not present

## 2018-08-01 DIAGNOSIS — Z79899 Other long term (current) drug therapy: Secondary | ICD-10-CM

## 2018-08-01 DIAGNOSIS — M25559 Pain in unspecified hip: Secondary | ICD-10-CM | POA: Diagnosis present

## 2018-08-01 DIAGNOSIS — Z7901 Long term (current) use of anticoagulants: Secondary | ICD-10-CM

## 2018-08-01 DIAGNOSIS — R0602 Shortness of breath: Secondary | ICD-10-CM | POA: Diagnosis not present

## 2018-08-01 DIAGNOSIS — Z86711 Personal history of pulmonary embolism: Secondary | ICD-10-CM | POA: Diagnosis not present

## 2018-08-01 DIAGNOSIS — R0789 Other chest pain: Secondary | ICD-10-CM | POA: Diagnosis not present

## 2018-08-01 DIAGNOSIS — J9621 Acute and chronic respiratory failure with hypoxia: Secondary | ICD-10-CM | POA: Diagnosis present

## 2018-08-01 DIAGNOSIS — F17210 Nicotine dependence, cigarettes, uncomplicated: Secondary | ICD-10-CM | POA: Diagnosis present

## 2018-08-01 DIAGNOSIS — N400 Enlarged prostate without lower urinary tract symptoms: Secondary | ICD-10-CM | POA: Diagnosis not present

## 2018-08-01 DIAGNOSIS — M549 Dorsalgia, unspecified: Secondary | ICD-10-CM | POA: Diagnosis not present

## 2018-08-01 DIAGNOSIS — E1159 Type 2 diabetes mellitus with other circulatory complications: Secondary | ICD-10-CM | POA: Diagnosis present

## 2018-08-01 DIAGNOSIS — J9620 Acute and chronic respiratory failure, unspecified whether with hypoxia or hypercapnia: Secondary | ICD-10-CM | POA: Diagnosis present

## 2018-08-01 DIAGNOSIS — I10 Essential (primary) hypertension: Secondary | ICD-10-CM | POA: Diagnosis present

## 2018-08-01 DIAGNOSIS — Z86718 Personal history of other venous thrombosis and embolism: Secondary | ICD-10-CM | POA: Diagnosis not present

## 2018-08-01 LAB — CBC WITH DIFFERENTIAL/PLATELET
Abs Immature Granulocytes: 0.02 10*3/uL (ref 0.00–0.07)
Basophils Absolute: 0 10*3/uL (ref 0.0–0.1)
Basophils Relative: 0 %
Eosinophils Absolute: 0.2 10*3/uL (ref 0.0–0.5)
Eosinophils Relative: 3 %
HEMATOCRIT: 47.1 % (ref 39.0–52.0)
Hemoglobin: 14.4 g/dL (ref 13.0–17.0)
Immature Granulocytes: 0 %
LYMPHS ABS: 2.3 10*3/uL (ref 0.7–4.0)
Lymphocytes Relative: 32 %
MCH: 27.3 pg (ref 26.0–34.0)
MCHC: 30.6 g/dL (ref 30.0–36.0)
MCV: 89.4 fL (ref 80.0–100.0)
Monocytes Absolute: 0.7 10*3/uL (ref 0.1–1.0)
Monocytes Relative: 10 %
Neutro Abs: 3.8 10*3/uL (ref 1.7–7.7)
Neutrophils Relative %: 55 %
Platelets: 322 10*3/uL (ref 150–400)
RBC: 5.27 MIL/uL (ref 4.22–5.81)
RDW: 14.6 % (ref 11.5–15.5)
WBC: 7.1 10*3/uL (ref 4.0–10.5)
nRBC: 0 % (ref 0.0–0.2)

## 2018-08-01 LAB — I-STAT TROPONIN, ED: Troponin i, poc: 0.01 ng/mL (ref 0.00–0.08)

## 2018-08-01 LAB — BASIC METABOLIC PANEL
Anion gap: 10 (ref 5–15)
BUN: 11 mg/dL (ref 8–23)
CO2: 25 mmol/L (ref 22–32)
Calcium: 9.6 mg/dL (ref 8.9–10.3)
Chloride: 103 mmol/L (ref 98–111)
Creatinine, Ser: 1.36 mg/dL — ABNORMAL HIGH (ref 0.61–1.24)
GFR calc Af Amer: >60 mL/min (ref >60)
GFR calc non Af Amer: 53 mL/min — ABNORMAL LOW (ref >60)
Glucose, Bld: 108 mg/dL — ABNORMAL HIGH (ref 70–99)
Potassium: 4 mmol/L (ref 3.5–5.1)
Sodium: 138 mmol/L (ref 135–145)

## 2018-08-01 MED ORDER — IOPAMIDOL (ISOVUE-370) INJECTION 76%
INTRAVENOUS | Status: AC
  Filled 2018-08-01: qty 100

## 2018-08-01 MED ORDER — MAGNESIUM SULFATE 2 GM/50ML IV SOLN
2.0000 g | Freq: Once | INTRAVENOUS | Status: AC
  Administered 2018-08-01: 2 g via INTRAVENOUS
  Filled 2018-08-01: qty 50

## 2018-08-01 MED ORDER — ALBUTEROL (5 MG/ML) CONTINUOUS INHALATION SOLN
10.0000 mg/h | INHALATION_SOLUTION | Freq: Once | RESPIRATORY_TRACT | Status: AC
  Administered 2018-08-01: 10 mg/h via RESPIRATORY_TRACT
  Filled 2018-08-01: qty 40

## 2018-08-01 MED ORDER — SODIUM CHLORIDE (PF) 0.9 % IJ SOLN
INTRAMUSCULAR | Status: AC
  Filled 2018-08-01: qty 50

## 2018-08-01 MED ORDER — IOPAMIDOL (ISOVUE-370) INJECTION 76%
100.0000 mL | Freq: Once | INTRAVENOUS | Status: AC | PRN
  Administered 2018-08-02: 100 mL via INTRAVENOUS

## 2018-08-01 NOTE — ED Provider Notes (Signed)
[place] DEPT Provider Note   CSN: [id number] Arrival date & time: 08/01/18  2002     History   Chief Complaint Chief Complaint  Patient presents with  . Respiratory Distress  . COPD  . Asthma    HPI Gregory Christensen is a 68 y.o. male with h/o PE and LLE DVT compliant with Eloquis, COPD and chronic respiratory failure on 2 L [place] at home, continued tobacco use, HTN, is here via EMS for SOB. Sudden onset around 330-4 pm today while he was sitting on his cough watching TV approx 30 min after smoking a cigarette. Constant, moderate to severe. Associated with diffuse chest tightness, cough w/o sputum.  He reports a "knot" in his lower sternum/epigastrium that is sore, gradually getting bigger since June. He has mild, chronic LLE swelling and leg pain since being diagnosed with DVT last year. He received albuterol, duoneb and solumedrol by EMS.  Slight hypoxia noted with transfer. He denies fevers, chills, body aches, LE edema. States he was discharged from hospital in December 2019 with breathing treatments but his daughter never picked up his prescriptions. He has not do routine breathing treatments at home since discharge from hospital last month.    HPI  Past Medical History:  Diagnosis Date  . Arthritis   . Chronic back pain   . Chronic hip pain   . COPD (chronic obstructive pulmonary disease) ([place])   . Emphysema lung [facility])     Patient Active Problem List   Diagnosis Date Noted  . Acute deep vein thrombosis (DVT) of left lower extremity ([place])   . Acute hypoxemic respiratory failure ([place])   . Pulmonary embolism ([place]) 05/31/2018  . COPD exacerbation ([place]) 10/18/2017  . Hypoxia   . Respiratory distress   . Essential hypertension     No past surgical history on file.      Home Medications    Prior to Admission medications   Medication Sig Start Date End Date Taking? Authorizing Provider  acetaminophen (TYLENOL) 500 MG tablet Take 1,000 mg by  mouth every 6 (six) hours as needed for moderate pain or headache.   Yes [provider]  albuterol (PROVENTIL HFA;VENTOLIN HFA) 108 (90 Base) MCG/ACT inhaler Inhale 2 puffs into the lungs every 2 (two) hours as needed for wheezing or shortness of breath. 05/04/18  Yes Isaac Bliss, Rayford Halsted, MD  amLODipine (NORVASC) 2.5 MG tablet Take 2.5 mg by mouth daily.   Yes [provider]  apixaban (ELIQUIS) 5 MG TABS tablet Take 1 tablet (5 mg total) by mouth 2 (two) times daily. 06/10/18  Yes Regalado, Belkys A, MD  budesonide-formoterol (SYMBICORT) 160-4.5 MCG/ACT inhaler Inhale 1 puff into the lungs 2 (two) times daily.   Yes [provider]  cetirizine (ZYRTEC) 10 MG tablet Take 10 mg by mouth daily.    Yes [provider]  fluticasone (FLONASE) 50 MCG/ACT nasal spray Place 1 spray into both nostrils 2 (two) times daily.    Yes [provider]  gabapentin (NEURONTIN) 300 MG capsule Take 300 mg by mouth 2 (two) times daily.    Yes [provider]  guaiFENesin (MUCINEX) 600 MG 12 hr tablet Take 1 tablet (600 mg total) by mouth 2 (two) times daily. 06/04/18  Yes Regalado, Belkys A, MD  Melatonin 3 MG TABS Take 3 mg by mouth at bedtime as needed (sleep).    Yes [provider]  montelukast (SINGULAIR) 10 MG tablet Take 1 tablet (10 mg total)  by mouth at bedtime. 06/04/18  Yes Regalado, Belkys A, MD  tamsulosin (FLOMAX) 0.4 MG CAPS capsule Take 0.4 mg by mouth daily after supper.   Yes [provider]  Tiotropium Bromide Monohydrate (SPIRIVA RESPIMAT) 1.25 MCG/ACT AERS Inhale 2 puffs into the lungs daily.   Yes [provider]  albuterol (PROVENTIL) (2.5 MG/3ML) 0.083% nebulizer solution Take 3 mLs (2.5 mg total) by nebulization every 6 (six) hours as needed for wheezing or shortness of breath. 06/04/18   Regalado, Belkys A, MD  apixaban (ELIQUIS) 5 MG TABS tablet Take 2 tablets (10 mg total) by mouth 2 (two) times daily. Patient  not taking: Reported on 08/01/2018 06/04/18   Elmarie Shiley, MD    Family History No family history on file.  Social History Social History   Tobacco Use  . Smoking status: Former Smoker    Packs/day: 1.00    Years: 26.00    Pack years: 26.00    Types: Cigarettes    Last attempt to quit: 10/18/2017    Years since quitting: 0.7  . Smokeless tobacco: Never Used  Substance Use Topics  . Alcohol use: Yes  . Drug use: No     Allergies   Patient has no known allergies.   Review of Systems Review of Systems  Respiratory: Positive for cough, chest tightness and shortness of breath.   Cardiovascular: Positive for leg swelling (LLE, chronic).  Hematological: Bruises/bleeds easily.  All other systems reviewed and are negative.    Physical Exam Updated Vital Signs BP (!) 155/93   Pulse (!) 112   Temp 97.6 F (36.4 C) (Oral)   Resp 20   SpO2 (!) 89%   Physical Exam Vitals signs and nursing note reviewed.  Constitutional:      Appearance: He is well-developed.     Comments: Non toxic.  HENT:     Head: Normocephalic and atraumatic.     Nose: Nose normal.  Eyes:     Conjunctiva/sclera: Conjunctivae normal.     Pupils: Pupils are equal, round, and reactive to light.  Neck:     Musculoskeletal: Normal range of motion.  Cardiovascular:     Rate and Rhythm: Normal rate and regular rhythm.     Heart sounds: Normal heart sounds.  Pulmonary:     [place]: Respiratory distress present.     Breath sounds: Wheezing present.     Comments: Diffuse inspiratory/expiratory wheezing in all lung fields.  Mild respiratory distress. Currently doing duoneb.  Speaking in 2-3 word sentences.  No retractions or belly breathing.   Abdominal:     General: Bowel sounds are normal.     Palpations: Abdomen is soft.     Tenderness: There is no abdominal tenderness.     Comments: Mild low sternum/epigastric tenderness however I do not palpable any skin abnormalities, subcutaneous nodules or  obvious deformities. Negative murphy's.   Musculoskeletal: Normal range of motion.  Skin:    General: Skin is warm and dry.     Capillary Refill: Capillary refill takes less than 2 seconds.  Neurological:     Mental Status: He is alert and oriented to person, place, and time.  Psychiatric:        Behavior: Behavior normal.        Thought Content: Thought content normal.        Judgment: Judgment normal.      ED Treatments / Results  Labs (all labs ordered are listed, but only abnormal results are displayed) Labs Reviewed  BASIC METABOLIC PANEL - Abnormal; Notable for the following components:      Result Value   Glucose, Bld 108 (*)    Creatinine, Ser 1.36 (*)    GFR calc non Af Amer 53 (*)    All other components within normal limits  CBC WITH DIFFERENTIAL/PLATELET  I-STAT TROPONIN, ED    EKG EKG Interpretation  Date/Time:  Friday August 01 2018 20:14:48 EST Ventricular Rate:  94 PR Interval:    QRS Duration: 97 QT Interval:  372 QTC Calculation: 466 R Axis:   62 Text Interpretation:  Sinus rhythm Biatrial enlargement No acute changes No significant change since last tracing Confirmed by Varney Biles [id number]) on 08/01/2018 9:22:07 PM   Radiology Dg Chest 2 View  Result Date: 08/01/2018 CLINICAL DATA:  Shortness of breath, COPD, asthma. EXAM: CHEST - 2 VIEW COMPARISON:  05/31/2018 FINDINGS: Heart size and pulmonary vascularity are normal. Emphysematous changes and scattered fibrosis in the lungs. Linear nodular scarring or infiltrative changes in the right mid lung are progressing since previous study. Short time frame suggest acute process, possibly pneumonia or atelectasis. Scattered calcified granulomas. No blunting of costophrenic angles. No pneumothorax. Mediastinal contours appear intact. IMPRESSION: 1. Emphysematous changes and fibrosis in the lungs. 2. Increasing linear nodular infiltration or atelectasis in the right mid lung. Electronically Signed   By:  Lucienne Capers M.D.   On: 08/01/2018 21:16    Procedures .Critical Care Performed by: Kinnie Feil, PA-C Authorized by: Kinnie Feil, PA-C   Critical care provider statement:    Critical care time (minutes):  45   Critical care was necessary to treat or prevent imminent or life-threatening deterioration of the following conditions:  Respiratory failure   Critical care was time spent personally by me on the following activities:  Discussions with consultants, evaluation of patient's response to treatment, examination of patient, ordering and performing treatments and interventions, ordering and review of laboratory studies, ordering and review of radiographic studies, pulse oximetry, re-evaluation of patient's condition, obtaining history from patient or surrogate and review of old charts   I assumed direction of critical care for this patient from another provider in my specialty: no     (including critical care time)  Medications Ordered in ED Medications  iopamidol (ISOVUE-370) 76 % injection 100 mL (has no administration in time range)  sodium chloride (PF) 0.9 % injection (has no administration in time range)  iopamidol (ISOVUE-370) 76 % injection (has no administration in time range)  albuterol (PROVENTIL,VENTOLIN) solution continuous neb (10 mg/hr Nebulization Given 08/01/18 2140)  magnesium sulfate IVPB 2 g 50 mL (0 g Intravenous Stopped 08/01/18 2258)     Initial Impression / Assessment and Plan / ED Course  I have reviewed the triage vital signs and the nursing notes.  Pertinent labs & imaging results that were available during my care of the patient were reviewed by me and considered in my medical decision making (see chart for details).  Clinical Course as of Aug 01 2352  Fri Aug 01, 2018  2126 FINDINGS: Heart size and pulmonary vascularity are normal. Emphysematous changes and scattered fibrosis in the lungs. Linear nodular scarring or infiltrative changes in  the right mid lung are progressing since previous study. Short time frame suggest acute process, possibly pneumonia or atelectasis. Scattered calcified granulomas. No blunting of costophrenic angles. No pneumothorax. Mediastinal contours appear intact.  DG Chest 2 View [CG]  2334 EMT walked pt without home 2 L [place], SpO2 dropped to 89%,  pt noted to be dyspnic after walk without oxygen.  I have asked tech to place pt on [place]. Will reassess.   [CG]    Clinical Course User Index [CG] Kinnie Feil, PA-C    Highest on differential diagnosis is acute COPD exacerbation.  Upon arrival patient is DuoNeb and unclear if there is hypoxia.  No hypoxia noted by EMS.  Mild respiratory distress.  Sudden onset of symptoms with diffuse wheezing and prior cigarette use suggests COPD exacerbation.  He has no constitutional symptoms and I doubt pneumonia.  He has chest tightness and this does not sound typical of ACS.  Has been compliant with Eliquis and has no pleuritic chest pain. This could be pulmonary infarct from PE burden.  Last echo with normal EF, he does not look clinically fluid overloaded and new onset HF exacerbation.    Labs unremarkable.  EKG without ischemia.  Troponin undetectable.  Chest x-ray with vague increased scarring versus infiltrate in the right midlung in the area of known PE.  Given clinical picture, we will treat for acute COPD exacerbation.  Plan to reassess, ambulate and determine if further imaging is necessary.  2340: Patient has had multiple interventions for COPD exacerbation, wheezing has minimally improved but he is still tachypneic, tachycardic and with increased work of breathing.  Given his age, advanced disease, risk we will speak to hospitalist for admission for acute COPD exacerbation.  Patient is in agreement with this. Final Clinical Impressions(s) / ED Diagnoses   Final diagnoses:  COPD exacerbation [facility])    ED Discharge Orders    None       Kinnie Feil,  PA-C 08/01/18 Concepcion, Ankit, MD 08/03/18 [phone]

## 2018-08-01 NOTE — ED Notes (Signed)
Bed: WA17 Expected date:  Expected time:  Means of arrival:  Comments: EMS 68yo male from home SOB/breathing tx

## 2018-08-01 NOTE — ED Triage Notes (Addendum)
Pt comes to ed via ems, SOB today around 3pm after smoking. Pt on home oxygen and hx of COPD and asthma. Pt was given 1 5 albuterol neb, 1 duo neb 125 solumedrol, 20 in left hand.  99 percent on on neb 10 liters. bp 146/100 hr tachy.  Hx of PE and on blood thinner. Hx of HTN

## 2018-08-01 NOTE — ED Notes (Signed)
In xray

## 2018-08-01 NOTE — ED Notes (Signed)
I found a patient was not on supplemental oxygen before I took him for a walk.

## 2018-08-02 ENCOUNTER — Other Ambulatory Visit: Payer: Self-pay

## 2018-08-02 ENCOUNTER — Encounter (HOSPITAL_COMMUNITY): Payer: Self-pay | Admitting: Internal Medicine

## 2018-08-02 DIAGNOSIS — I10 Essential (primary) hypertension: Secondary | ICD-10-CM

## 2018-08-02 DIAGNOSIS — J441 Chronic obstructive pulmonary disease with (acute) exacerbation: Principal | ICD-10-CM

## 2018-08-02 LAB — BASIC METABOLIC PANEL
Anion gap: 10 (ref 5–15)
BUN: 12 mg/dL (ref 8–23)
CALCIUM: 8.9 mg/dL (ref 8.9–10.3)
CO2: 21 mmol/L — ABNORMAL LOW (ref 22–32)
Chloride: 102 mmol/L (ref 98–111)
Creatinine, Ser: 1.35 mg/dL — ABNORMAL HIGH (ref 0.61–1.24)
GFR calc non Af Amer: 54 mL/min — ABNORMAL LOW (ref >60)
Glucose, Bld: 170 mg/dL — ABNORMAL HIGH (ref 70–99)
Potassium: 4.7 mmol/L (ref 3.5–5.1)
Sodium: 133 mmol/L — ABNORMAL LOW (ref 135–145)

## 2018-08-02 LAB — CBC
HCT: 41.9 % (ref 39.0–52.0)
Hemoglobin: 12.9 g/dL — ABNORMAL LOW (ref 13.0–17.0)
MCH: 28 pg (ref 26.0–34.0)
MCHC: 30.8 g/dL (ref 30.0–36.0)
MCV: 91.1 fL (ref 80.0–100.0)
PLATELETS: 246 10*3/uL (ref 150–400)
RBC: 4.6 MIL/uL (ref 4.22–5.81)
RDW: 14.7 % (ref 11.5–15.5)
WBC: 5.7 10*3/uL (ref 4.0–10.5)
nRBC: 0 % (ref 0.0–0.2)

## 2018-08-02 LAB — TROPONIN I: Troponin I: <0.03 ng/mL (ref <0.03)

## 2018-08-02 MED ORDER — TAMSULOSIN HCL 0.4 MG PO CAPS
0.4000 mg | ORAL_CAPSULE | Freq: Every day | ORAL | Status: DC
  Administered 2018-08-02 – 2018-08-03 (×2): 0.4 mg via ORAL
  Filled 2018-08-02 (×2): qty 1

## 2018-08-02 MED ORDER — ONDANSETRON HCL 4 MG/2ML IJ SOLN: 4.0000 mg | Freq: Four times a day (QID) | INTRAMUSCULAR | Status: DC | PRN

## 2018-08-02 MED ORDER — AMLODIPINE BESYLATE 5 MG PO TABS
2.5000 mg | ORAL_TABLET | Freq: Every day | ORAL | Status: DC
  Administered 2018-08-02 – 2018-08-04 (×3): 2.5 mg via ORAL
  Filled 2018-08-02 (×3): qty 1

## 2018-08-02 MED ORDER — IPRATROPIUM BROMIDE 0.02 % IN SOLN: 0.5000 mg | RESPIRATORY_TRACT | Status: DC

## 2018-08-02 MED ORDER — IPRATROPIUM-ALBUTEROL 0.5-2.5 (3) MG/3ML IN SOLN
3.0000 mL | RESPIRATORY_TRACT | Status: DC
  Administered 2018-08-02 – 2018-08-04 (×13): 3 mL via RESPIRATORY_TRACT
  Filled 2018-08-02 (×12): qty 3

## 2018-08-02 MED ORDER — ALBUTEROL SULFATE (2.5 MG/3ML) 0.083% IN NEBU: 2.5000 mg | INHALATION_SOLUTION | RESPIRATORY_TRACT | Status: DC | PRN

## 2018-08-02 MED ORDER — PANTOPRAZOLE SODIUM 40 MG PO TBEC
40.0000 mg | DELAYED_RELEASE_TABLET | Freq: Every day | ORAL | Status: DC
  Administered 2018-08-02 – 2018-08-04 (×3): 40 mg via ORAL
  Filled 2018-08-02 (×3): qty 1

## 2018-08-02 MED ORDER — METHYLPREDNISOLONE SODIUM SUCC 40 MG IJ SOLR
40.0000 mg | Freq: Two times a day (BID) | INTRAMUSCULAR | Status: DC
  Administered 2018-08-02 – 2018-08-03 (×3): 40 mg via INTRAVENOUS
  Filled 2018-08-02 (×3): qty 1

## 2018-08-02 MED ORDER — MELATONIN 3 MG PO TABS
3.0000 mg | ORAL_TABLET | Freq: Every evening | ORAL | Status: DC | PRN
  Filled 2018-08-02: qty 1

## 2018-08-02 MED ORDER — ACETAMINOPHEN 325 MG PO TABS: 650.0000 mg | ORAL_TABLET | Freq: Four times a day (QID) | ORAL | Status: DC | PRN

## 2018-08-02 MED ORDER — ACETAMINOPHEN 650 MG RE SUPP: 650.0000 mg | Freq: Four times a day (QID) | RECTAL | Status: DC | PRN

## 2018-08-02 MED ORDER — LORATADINE 10 MG PO TABS
10.0000 mg | ORAL_TABLET | Freq: Every day | ORAL | Status: DC
  Administered 2018-08-02 – 2018-08-04 (×3): 10 mg via ORAL
  Filled 2018-08-02 (×3): qty 1

## 2018-08-02 MED ORDER — ONDANSETRON HCL 4 MG PO TABS: 4.0000 mg | ORAL_TABLET | Freq: Four times a day (QID) | ORAL | Status: DC | PRN

## 2018-08-02 MED ORDER — IPRATROPIUM-ALBUTEROL 0.5-2.5 (3) MG/3ML IN SOLN: 3.0000 mL | Freq: Four times a day (QID) | RESPIRATORY_TRACT | Status: DC

## 2018-08-02 MED ORDER — MONTELUKAST SODIUM 10 MG PO TABS
10.0000 mg | ORAL_TABLET | Freq: Every day | ORAL | Status: DC
  Administered 2018-08-02 – 2018-08-03 (×2): 10 mg via ORAL
  Filled 2018-08-02 (×2): qty 1

## 2018-08-02 MED ORDER — GUAIFENESIN ER 600 MG PO TB12
600.0000 mg | ORAL_TABLET | Freq: Two times a day (BID) | ORAL | Status: DC
  Administered 2018-08-02 – 2018-08-04 (×5): 600 mg via ORAL
  Filled 2018-08-02 (×5): qty 1

## 2018-08-02 MED ORDER — APIXABAN 5 MG PO TABS
5.0000 mg | ORAL_TABLET | Freq: Two times a day (BID) | ORAL | Status: DC
  Administered 2018-08-02 – 2018-08-04 (×5): 5 mg via ORAL
  Filled 2018-08-02 (×6): qty 1

## 2018-08-02 MED ORDER — IPRATROPIUM-ALBUTEROL 0.5-2.5 (3) MG/3ML IN SOLN
RESPIRATORY_TRACT | Status: AC
  Filled 2018-08-02: qty 3

## 2018-08-02 MED ORDER — GABAPENTIN 300 MG PO CAPS
300.0000 mg | ORAL_CAPSULE | Freq: Two times a day (BID) | ORAL | Status: DC
  Administered 2018-08-02 – 2018-08-04 (×5): 300 mg via ORAL
  Filled 2018-08-02 (×5): qty 1

## 2018-08-02 MED ORDER — FLUTICASONE PROPIONATE 50 MCG/ACT NA SUSP
1.0000 | Freq: Two times a day (BID) | NASAL | Status: DC
  Administered 2018-08-02 – 2018-08-04 (×5): 1 via NASAL
  Filled 2018-08-02 (×2): qty 16

## 2018-08-02 MED ORDER — BUDESONIDE 0.25 MG/2ML IN SUSP
0.2500 mg | Freq: Two times a day (BID) | RESPIRATORY_TRACT | Status: DC
  Administered 2018-08-02 – 2018-08-04 (×4): 0.25 mg via RESPIRATORY_TRACT
  Filled 2018-08-02 (×4): qty 2

## 2018-08-02 MED ORDER — ALBUTEROL SULFATE (2.5 MG/3ML) 0.083% IN NEBU: 2.5000 mg | INHALATION_SOLUTION | RESPIRATORY_TRACT | Status: DC

## 2018-08-02 NOTE — ED Notes (Signed)
Placed to hospital bed

## 2018-08-02 NOTE — H&P (Signed)
History and Physical    Guerino Stoke PJK:[id number] DOB: Jan 21, 1951 DOA: 08/01/2018  PCP: Leighton Ruff, MD  Patient coming from: Home.  Chief Complaint: Shortness of breath.  HPI: Gregory Christensen is a 68 y.o. 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  Has been having wheezing and productive cough some pleuritic type of chest pain.  Denies any fever chills nausea vomiting abdominal pain.  Has been compliant with his medications.  ED Course: In the ER patient had a CAT scan of the chest which was negative for any new pulmonary embolism and did see resolution of previous pulmonary embolism.  EKG shows normal sinus rhythm with nonspecific ST changes.  Troponin was negative.  Patient was continuing to wheeze despite nebulizer treatment and admitted for acute exacerbation of COPD.  Review of Systems: As per HPI, rest all negative.   Past Medical History:  Diagnosis Date  . Arthritis   . Chronic back pain   . Chronic hip pain   . COPD (chronic obstructive pulmonary disease) ([place])   . Emphysema lung ([place])     History reviewed. No pertinent surgical history.   reports that he quit smoking about 9 months ago. His smoking use included cigarettes. He has a 26.00 pack-year smoking history. He has never used smokeless tobacco. He reports current alcohol use. He reports that he does not use drugs.  No Known Allergies  History reviewed. No pertinent family history.  Prior to Admission medications   Medication Sig Start Date End Date Taking? Authorizing Provider  acetaminophen (TYLENOL) 500 MG tablet Take 1,000 mg by mouth every 6 (six) hours as needed for moderate pain or headache.   Yes [provider]  albuterol (PROVENTIL HFA;VENTOLIN HFA) 108 (90 Base) MCG/ACT inhaler Inhale 2 puffs into the lungs every 2 (two) hours as needed for wheezing or shortness of  breath. 05/04/18  Yes Isaac Bliss, Rayford Halsted, MD  amLODipine (NORVASC) 2.5 MG tablet Take 2.5 mg by mouth daily.   Yes [provider]  apixaban (ELIQUIS) 5 MG TABS tablet Take 1 tablet (5 mg total) by mouth 2 (two) times daily. 06/10/18  Yes Regalado, Belkys A, MD  budesonide-formoterol (SYMBICORT) 160-4.5 MCG/ACT inhaler Inhale 1 puff into the lungs 2 (two) times daily.   Yes [provider]  cetirizine (ZYRTEC) 10 MG tablet Take 10 mg by mouth daily.    Yes [provider]  fluticasone (FLONASE) 50 MCG/ACT nasal spray Place 1 spray into both nostrils 2 (two) times daily.    Yes [provider]  gabapentin (NEURONTIN) 300 MG capsule Take 300 mg by mouth 2 (two) times daily.    Yes [provider]  guaiFENesin (MUCINEX) 600 MG 12 hr tablet Take 1 tablet (600 mg total) by mouth 2 (two) times daily. 06/04/18  Yes Regalado, Belkys A, MD  Melatonin 3 MG TABS Take 3 mg by mouth at bedtime as needed (sleep).    Yes [provider]  montelukast (SINGULAIR) 10 MG tablet Take 1 tablet (10 mg total) by mouth at bedtime. 06/04/18  Yes Regalado, Belkys A, MD  tamsulosin (FLOMAX) 0.4 MG CAPS capsule Take 0.4 mg by mouth daily after supper.   Yes [provider]  Tiotropium Bromide Monohydrate (SPIRIVA RESPIMAT) 1.25 MCG/ACT AERS Inhale 2 puffs into the lungs daily.   Yes [provider]  albuterol (PROVENTIL) (2.5 MG/3ML) 0.083% nebulizer  solution Take 3 mLs (2.5 mg total) by nebulization every 6 (six) hours as needed for wheezing or shortness of breath. 06/04/18   Regalado, Belkys A, MD  apixaban (ELIQUIS) 5 MG TABS tablet Take 2 tablets (10 mg total) by mouth 2 (two) times daily. Patient not taking: Reported on 08/01/2018 06/04/18   Elmarie Shiley, MD    Physical Exam: Vitals:   08/02/18 0000 08/02/18 0157 08/02/18 0241 08/02/18 0247  BP: (!) 137/97 (!) 155/88 (!) 165/106 133/73  Pulse: 97 91 88   Resp: (!) 22 16 19    Temp:        TempSrc:      SpO2: 98% 99% 99%       Constitutional: Moderately built and nourished. Vitals:   08/02/18 0000 08/02/18 0157 08/02/18 0241 08/02/18 0247  BP: (!) 137/97 (!) 155/88 (!) 165/106 133/73  Pulse: 97 91 88   Resp: (!) 22 16 19    Temp:      TempSrc:      SpO2: 98% 99% 99%    Eyes: Anicteric no pallor. ENMT: No discharge from the ears eyes nose and mouth. Neck: No mass felt.  No neck rigidity no JVD appreciated. Respiratory: Bilateral expiratory wheeze and no crepitations. Cardiovascular: S1-S2 heard. Abdomen: Soft nontender bowel sounds present. Musculoskeletal: No edema.  No joint effusion. Skin: No rash. Neurologic: Alert awake oriented to time place and person.  Moves all extremities. Psychiatric: Appears normal.  Normal affect.   Labs on Admission: I have personally reviewed following labs and imaging studies  CBC: Recent Labs  Lab 08/01/18 2053  WBC 7.1  NEUTROABS 3.8  HGB 14.4  HCT 47.1  MCV 89.4  PLT 275   Basic Metabolic Panel: Recent Labs  Lab 08/01/18 2053  NA 138  K 4.0  CL 103  CO2 25  GLUCOSE 108*  BUN 11  CREATININE 1.36*  CALCIUM 9.6   GFR: CrCl cannot be calculated (Unknown ideal weight.). Liver Function Tests: No results for input(s): AST, ALT, ALKPHOS, BILITOT, PROT, ALBUMIN in the last 168 hours. No results for input(s): LIPASE, AMYLASE in the last 168 hours. No results for input(s): AMMONIA in the last 168 hours. Coagulation Profile: No results for input(s): INR, PROTIME in the last 168 hours. Cardiac Enzymes: No results for input(s): CKTOTAL, CKMB, CKMBINDEX, TROPONINI in the last 168 hours. BNP (last 3 results) No results for input(s): PROBNP in the last 8760 hours. HbA1C: No results for input(s): HGBA1C in the last 72 hours. CBG: No results for input(s): GLUCAP in the last 168 hours. Lipid Profile: No results for input(s): CHOL, HDL, LDLCALC, TRIG, CHOLHDL, LDLDIRECT in the last 72 hours. Thyroid Function  Tests: No results for input(s): TSH, T4TOTAL, FREET4, T3FREE, THYROIDAB in the last 72 hours. Anemia Panel: No results for input(s): VITAMINB12, FOLATE, FERRITIN, TIBC, IRON, RETICCTPCT in the last 72 hours. Urine analysis:    Component Value Date/Time   COLORURINE YELLOW 10/20/2017 0824   APPEARANCEUR CLEAR 10/20/2017 0824   LABSPEC 1.011 10/20/2017 0824   PHURINE 6.0 10/20/2017 0824   GLUCOSEU NEGATIVE 10/20/2017 0824   HGBUR SMALL (A) 10/20/2017 0824   BILIRUBINUR NEGATIVE 10/20/2017 0824   KETONESUR NEGATIVE 10/20/2017 0824   PROTEINUR NEGATIVE 10/20/2017 0824   NITRITE NEGATIVE 10/20/2017 0824   LEUKOCYTESUR LARGE (A) 10/20/2017 0824   Sepsis Labs: @LABRCNTIP (procalcitonin:4,lacticidven:4) )No results found for this or any previous visit (from the past 240 hour(s)).   Radiological Exams on Admission: Dg Chest 2 View  Result Date: 08/01/2018  CLINICAL DATA:  Shortness of breath, COPD, asthma. EXAM: CHEST - 2 VIEW COMPARISON:  05/31/2018 FINDINGS: Heart size and pulmonary vascularity are normal. Emphysematous changes and scattered fibrosis in the lungs. Linear nodular scarring or infiltrative changes in the right mid lung are progressing since previous study. Short time frame suggest acute process, possibly pneumonia or atelectasis. Scattered calcified granulomas. No blunting of costophrenic angles. No pneumothorax. Mediastinal contours appear intact. IMPRESSION: 1. Emphysematous changes and fibrosis in the lungs. 2. Increasing linear nodular infiltration or atelectasis in the right mid lung. Electronically Signed   By: Lucienne Capers M.D.   On: 08/01/2018 21:16   Ct Angio Chest Pe W And/or Wo Contrast  Result Date: 08/02/2018 CLINICAL DATA:  COPD and asthma with dyspnea around 3 p.m. after smoking. EXAM: CT ANGIOGRAPHY CHEST WITH CONTRAST TECHNIQUE: Multidetector CT imaging of the chest was performed using the standard protocol during bolus administration of intravenous contrast.  Multiplanar CT image reconstructions and MIPs were obtained to evaluate the vascular anatomy. CONTRAST:  187mL ISOVUE-370 IOPAMIDOL (ISOVUE-370) INJECTION 76% COMPARISON:  05/31/2018 chest CT FINDINGS: Cardiovascular: Conventional branch pattern of the great vessels. Resolution of previously noted filling defect extending into the right upper lobe lobar pulmonary arterial branch and segmental branch vessels. No large central pulmonary embolus is identified. Hypodensity extending into the right lower lobe pulmonary arteries are felt artifactual due to motion and left arm streak artifacts as better visualized on the reformatted images. No obvious aortic dissection. No aortic aneurysm. Atherosclerosis of the aorta and great vessels. Three-vessel coronary arteriosclerosis. Normal heart size without pericardial effusion or thickening. Mediastinum/Nodes: No enlarged mediastinal, hilar, or axillary lymph nodes. Thyroid gland, trachea, and esophagus demonstrate no significant findings. Lungs/Pleura: Panlobular emphysema throughout both lungs. New foci of subsegmental atelectasis and/or scarring the inferior right upper lobe abutting Boodram fissure and extending to the pleura. Atelectasis is also noted medially within the right middle lobe. No effusion or pneumothorax. Upper Abdomen: Dilatation and ectasia of the left included renal collecting system consistent previously described mild left-sided hydronephrosis. Musculoskeletal: No acute nor aggressive osseous lesions of the bony thorax. Lower cervical spondylosis. Review of the MIP images confirms the above findings. IMPRESSION: 1. Resolution of previously noted right upper lobe lobar pulmonary embolus. No large central pulmonary embolus is identified. 2. Panlobular emphysema with new areas of subsegmental atelectasis and/or scarring in the right upper lobe and right middle lobe. 3. Respiratory motion and left arm streak artifact simulating pulmonary emboli to the right  lower lobe are identified. No acute pulmonary embolus is currently noted. Aortic Atherosclerosis (ICD10-I70.0) and Emphysema (ICD10-J43.9). Electronically Signed   By: Ashley Royalty M.D.   On: 08/02/2018 00:39    EKG: Independently reviewed.  Normal sinus rhythm with right atrial enlargement and nonspecific ST changes anterior leads.  Assessment/Plan Principal Problem:   COPD exacerbation (HCC) Active Problems:   Essential hypertension    1. Acute respiratory failure with hypoxia secondary to COPD exacerbation -patient continues to be wheezing and short of breath for which we will be admitting and keep patient on nebulizer albuterol Atrovent Pulmicort and IV steroids. 2. Hypertension on amlodipine. 3. Pleuritic type of chest pain negative for any new PE.  We will cycle cardiac markers. 4. Recent PE and DVT on apixaban which will be continued. 5. BPH on tamsulosin.   DVT prophylaxis: Apixaban. Code Status: Full code. Family Communication: Discussed with patient. Disposition Plan: Home. Consults called: None. Admission status: Observation.   Rise Patience MD [facility] Pager  [place] [phone].  If 7PM-7AM, please contact night-coverage [email or web address] Password [facility]  08/02/2018, 3:25 AM

## 2018-08-02 NOTE — ED Notes (Signed)
attempted to walk pt to bathroom on 2 liters

## 2018-08-02 NOTE — ED Notes (Signed)
ED TO INPATIENT HANDOFF REPORT  Name/Age/Gender Gregory Christensen 68 y.o. male  Code Status    Code Status Orders  (From admission, onward)         Start     Ordered   08/02/18 0326  Do not attempt resuscitation (DNR)  Continuous    Question Answer Comment  In the event of cardiac or respiratory ARREST Do not call a "code blue"   In the event of cardiac or respiratory ARREST Do not perform Intubation, CPR, defibrillation or ACLS   In the event of cardiac or respiratory ARREST Use medication by any route, position, wound care, and other measures to relive pain and suffering. May use oxygen, suction and manual treatment of airway obstruction as needed for comfort.   Comments DNR      08/02/18 0325        Code Status History    Date Active Date Inactive Code Status Order ID Comments User Context   05/31/2018 1258 06/04/2018 1718 DNR [id number]  Cristal Deer, MD Inpatient   05/02/2018 2211 05/04/2018 1735 Full Code [id number]  Gwynne Edinger, MD Inpatient   10/18/2017 1932 10/24/2017 1449 Full Code [id number]  Kayleen Memos, DO Inpatient      Home/SNF/Other Home  Chief Complaint resp distress  Level of Care/Admitting Diagnosis ED Disposition    ED Disposition Condition Comment   [facility]: [facility] [[id number]]  Level of Care: Telemetry [5]  Admit to tele based on following criteria: Monitor for Ischemic changes  Diagnosis: COPD exacerbation [facility]) [834196]  Admitting Physician: Rise Patience [id number]  Attending Physician: Rise Patience [email or web address]  PT Class (Do Not Modify): Observation [104]  PT Acc Code (Do Not Modify): Observation [10022]       Medical History Past Medical History:  Diagnosis Date  . Arthritis   . Chronic back pain   . Chronic hip pain   . COPD (chronic obstructive pulmonary disease) ([place])   . Emphysema lung ([place])     Allergies No Known Allergies  IV Location/Drains/Wounds Patient  Lines/Drains/Airways Status   Active Line/Drains/Airways    Name:   Placement date:   Placement time:   Site:   Days:   Peripheral IV 08/01/18 Left Hand   08/01/18    2032    Hand   1          Labs/Imaging Results for orders placed or performed during the hospital encounter of 08/01/18 (from the past 48 hour(s))  CBC with Differential     Status: None   Collection Time: 08/01/18  8:53 PM  Result Value Ref Range   WBC 7.1 4.0 - 10.5 K/uL   RBC 5.27 4.22 - 5.81 MIL/uL   Hemoglobin 14.4 13.0 - 17.0 g/dL   HCT 47.1 39.0 - 52.0 %   MCV 89.4 80.0 - 100.0 fL   MCH 27.3 26.0 - 34.0 pg   MCHC 30.6 30.0 - 36.0 g/dL   RDW 14.6 11.5 - 15.5 %   Platelets 322 150 - 400 K/uL   nRBC 0.0 0.0 - 0.2 %   Neutrophils Relative % 55 %   Neutro Abs 3.8 1.7 - 7.7 K/uL   Lymphocytes Relative 32 %   Lymphs Abs 2.3 0.7 - 4.0 K/uL   Monocytes Relative 10 %   Monocytes Absolute 0.7 0.1 - 1.0 K/uL   Eosinophils Relative 3 %   Eosinophils Absolute 0.2 0.0 - 0.5 K/uL   Basophils Relative 0 %  Basophils Absolute 0.0 0.0 - 0.1 K/uL   Immature Granulocytes 0 %   Abs Immature Granulocytes 0.02 0.00 - 0.07 K/uL    Comment: Performed at [facility], [place] [address] [place], [place] [address]  Basic metabolic panel     Status: Abnormal   Collection Time: 08/01/18  8:53 PM  Result Value Ref Range   Sodium 138 135 - 145 mmol/L   Potassium 4.0 3.5 - 5.1 mmol/L   Chloride 103 98 - 111 mmol/L   CO2 25 22 - 32 mmol/L   Glucose, Bld 108 (H) 70 - 99 mg/dL   BUN 11 8 - 23 mg/dL   Creatinine, Ser 1.36 (H) 0.61 - 1.24 mg/dL   Calcium 9.6 8.9 - 10.3 mg/dL   GFR calc non Af Amer 53 (L) >60 mL/min   GFR calc Af Amer >60 >60 mL/min   Anion gap 10 5 - 15    Comment: Performed at [facility], [place] [address], [place], [place] [address]  I-Stat Troponin, ED (not at [facility])     Status: None   Collection Time: 08/01/18  9:00 PM  Result Value Ref Range   Troponin i, poc 0.01 0.00 -  0.08 ng/mL   Comment 3            Comment: Due to the release kinetics of cTnI, a negative result within the first hours of the onset of symptoms does not rule out myocardial infarction with certainty. If myocardial infarction is still suspected, repeat the test at appropriate intervals.   Basic metabolic panel     Status: Abnormal   Collection Time: 08/02/18  4:45 AM  Result Value Ref Range   Sodium 133 (L) 135 - 145 mmol/L   Potassium 4.7 3.5 - 5.1 mmol/L    Comment: SLIGHT HEMOLYSIS   Chloride 102 98 - 111 mmol/L   CO2 21 (L) 22 - 32 mmol/L   Glucose, Bld 170 (H) 70 - 99 mg/dL   BUN 12 8 - 23 mg/dL   Creatinine, Ser 1.35 (H) 0.61 - 1.24 mg/dL   Calcium 8.9 8.9 - 10.3 mg/dL   GFR calc non Af Amer 54 (L) >60 mL/min   GFR calc Af Amer >60 >60 mL/min   Anion gap 10 5 - 15    Comment: Performed at [facility], [place] [address], [place], [place] [address]  CBC     Status: Abnormal   Collection Time: 08/02/18  4:45 AM  Result Value Ref Range   WBC 5.7 4.0 - 10.5 K/uL   RBC 4.60 4.22 - 5.81 MIL/uL   Hemoglobin 12.9 (L) 13.0 - 17.0 g/dL   HCT 41.9 39.0 - 52.0 %   MCV 91.1 80.0 - 100.0 fL   MCH 28.0 26.0 - 34.0 pg   MCHC 30.8 30.0 - 36.0 g/dL   RDW 14.7 11.5 - 15.5 %   Platelets 246 150 - 400 K/uL   nRBC 0.0 0.0 - 0.2 %    Comment: Performed at [facility], [place] [address] [place], [place] [address]  Troponin I - Once     Status: None   Collection Time: 08/02/18  4:45 AM  Result Value Ref Range   Troponin I <0.03 <0.03 ng/mL    Comment: Performed at [facility], [place] [address], [place],  [address]   Dg Chest 2 View  Result Date: 08/01/2018 CLINICAL DATA:  Shortness of breath, COPD, asthma. EXAM: CHEST -  2 VIEW COMPARISON:  05/31/2018 FINDINGS: Heart size and pulmonary vascularity are normal. Emphysematous changes and scattered fibrosis in the lungs. Linear nodular scarring or infiltrative changes in the right mid  lung are progressing since previous study. Short time frame suggest acute process, possibly pneumonia or atelectasis. Scattered calcified granulomas. No blunting of costophrenic angles. No pneumothorax. Mediastinal contours appear intact. IMPRESSION: 1. Emphysematous changes and fibrosis in the lungs. 2. Increasing linear nodular infiltration or atelectasis in the right mid lung. Electronically Signed   By: Lucienne Capers M.D.   On: 08/01/2018 21:16   Ct Angio Chest Pe W And/or Wo Contrast  Result Date: 08/02/2018 CLINICAL DATA:  COPD and asthma with dyspnea around 3 p.m. after smoking. EXAM: CT ANGIOGRAPHY CHEST WITH CONTRAST TECHNIQUE: Multidetector CT imaging of the chest was performed using the standard protocol during bolus administration of intravenous contrast. Multiplanar CT image reconstructions and MIPs were obtained to evaluate the vascular anatomy. CONTRAST:  159mL ISOVUE-370 IOPAMIDOL (ISOVUE-370) INJECTION 76% COMPARISON:  05/31/2018 chest CT FINDINGS: Cardiovascular: Conventional branch pattern of the great vessels. Resolution of previously noted filling defect extending into the right upper lobe lobar pulmonary arterial branch and segmental branch vessels. No large central pulmonary embolus is identified. Hypodensity extending into the right lower lobe pulmonary arteries are felt artifactual due to motion and left arm streak artifacts as better visualized on the reformatted images. No obvious aortic dissection. No aortic aneurysm. Atherosclerosis of the aorta and great vessels. Three-vessel coronary arteriosclerosis. Normal heart size without pericardial effusion or thickening. Mediastinum/Nodes: No enlarged mediastinal, hilar, or axillary lymph nodes. Thyroid gland, trachea, and esophagus demonstrate no significant findings. Lungs/Pleura: Panlobular emphysema throughout both lungs. New foci of subsegmental atelectasis and/or scarring the inferior right upper lobe abutting [place] fissure and  extending to the pleura. Atelectasis is also noted medially within the right middle lobe. No effusion or pneumothorax. Upper Abdomen: Dilatation and ectasia of the left included renal collecting system consistent previously described mild left-sided hydronephrosis. Musculoskeletal: No acute nor aggressive osseous lesions of the bony thorax. Lower cervical spondylosis. Review of the MIP images confirms the above findings. IMPRESSION: 1. Resolution of previously noted right upper lobe lobar pulmonary embolus. No large central pulmonary embolus is identified. 2. Panlobular emphysema with new areas of subsegmental atelectasis and/or scarring in the right upper lobe and right middle lobe. 3. Respiratory motion and left arm streak artifact simulating pulmonary emboli to the right lower lobe are identified. No acute pulmonary embolus is currently noted. Aortic Atherosclerosis (ICD10-I70.0) and Emphysema (ICD10-J43.9). Electronically Signed   By: Ashley Royalty M.D.   On: 08/02/2018 00:39   EKG Interpretation  Date/Time:  Friday August 01 2018 21:02:08 EST Ventricular Rate:  94 PR Interval:    QRS Duration: 86 QT Interval:  368 QTC Calculation: 461 R Axis:   57 Text Interpretation:  Sinus rhythm Right atrial enlargement Minimal ST depression, anterior leads Confirmed by Milton Ferguson [phone]) on 08/02/2018 11:38:26 AM   Pending Labs Unresulted Labs (From admission, onward)   None      Vitals/Pain Today's Vitals   08/02/18 1130 08/02/18 1145 08/02/18 1228 08/02/18 1300  BP: 106/75  (!) 154/90 120/66  Pulse: 75 79 90 91  Resp: 18 18 20 17   Temp:      TempSrc:      SpO2: 97% 100% 100% 97%  PainSc:        Isolation Precautions No active isolations  Medications Medications  sodium chloride (PF) 0.9 % injection (has no administration  in time range)  iopamidol (ISOVUE-370) 76 % injection (has no administration in time range)  amLODipine (NORVASC) tablet 2.5 mg (2.5 mg Oral Given 08/02/18 1024)   tamsulosin (FLOMAX) capsule 0.4 mg (has no administration in time range)  apixaban (ELIQUIS) tablet 5 mg (5 mg Oral Given 08/02/18 1210)  Melatonin TABS 3 mg (has no administration in time range)  gabapentin (NEURONTIN) capsule 300 mg (300 mg Oral Given 08/02/18 1023)  loratadine (CLARITIN) tablet 10 mg (10 mg Oral Given 08/02/18 1023)  fluticasone (FLONASE) 50 MCG/ACT nasal spray 1 spray (1 spray Each Nare Given 08/02/18 1025)  guaiFENesin (MUCINEX) 12 hr tablet 600 mg (600 mg Oral Given 08/02/18 1023)  montelukast (SINGULAIR) tablet 10 mg (has no administration in time range)  acetaminophen (TYLENOL) tablet 650 mg (has no administration in time range)    Or  acetaminophen (TYLENOL) suppository 650 mg (has no administration in time range)  ondansetron (ZOFRAN) tablet 4 mg (has no administration in time range)    Or  ondansetron (ZOFRAN) injection 4 mg (has no administration in time range)  albuterol (PROVENTIL) (2.5 MG/3ML) 0.083% nebulizer solution 2.5 mg (has no administration in time range)  budesonide (PULMICORT) nebulizer solution 0.25 mg (0.25 mg Nebulization Given 08/02/18 0806)  methylPREDNISolone sodium succinate (SOLU-MEDROL) 40 mg/mL injection 40 mg (40 mg Intravenous Given 08/02/18 0834)  pantoprazole (PROTONIX) EC tablet 40 mg (40 mg Oral Given 08/02/18 1023)  ipratropium-albuterol (DUONEB) 0.5-2.5 (3) MG/3ML nebulizer solution 3 mL (3 mLs Nebulization Given 08/02/18 1145)  ipratropium-albuterol (DUONEB) 0.5-2.5 (3) MG/3ML nebulizer solution (  Not Given 08/02/18 0433)  albuterol (PROVENTIL,VENTOLIN) solution continuous neb (10 mg/hr Nebulization Given 08/01/18 2140)  magnesium sulfate IVPB 2 g 50 mL (0 g Intravenous Stopped 08/01/18 2258)  iopamidol (ISOVUE-370) 76 % injection 100 mL (100 mLs Intravenous Contrast Given 08/02/18 0013)    Mobility walks

## 2018-08-02 NOTE — Progress Notes (Signed)
Patient was admitted today with COPD exacerbation.  Patient continues to smoke cigarettes.  As by H&P done earlier today-"Gregory Christensen is a 68 y.o. 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  Has been having wheezing and productive cough some pleuritic type of chest pain.  Denies any fever chills nausea vomiting abdominal pain.  Has been compliant with his medications.  ED Course: In the ER patient had a CAT scan of the chest which was negative for any new pulmonary embolism and did see resolution of previous pulmonary embolism.  EKG shows normal sinus rhythm with nonspecific ST changes.  Troponin was negative.  Patient was continuing to wheeze despite nebulizer treatment and admitted for acute exacerbation of COPD".  Will optimize COPD management.

## 2018-08-02 NOTE — Progress Notes (Signed)
RN got report from ED at 1345. Patient came to [place] at 62. Alert and oriented x4. Vital signs was taken. Call light was in patient's reach.

## 2018-08-03 DIAGNOSIS — F17210 Nicotine dependence, cigarettes, uncomplicated: Secondary | ICD-10-CM | POA: Diagnosis present

## 2018-08-03 DIAGNOSIS — J441 Chronic obstructive pulmonary disease with (acute) exacerbation: Secondary | ICD-10-CM | POA: Diagnosis present

## 2018-08-03 DIAGNOSIS — Z79899 Other long term (current) drug therapy: Secondary | ICD-10-CM | POA: Diagnosis not present

## 2018-08-03 DIAGNOSIS — M549 Dorsalgia, unspecified: Secondary | ICD-10-CM | POA: Diagnosis present

## 2018-08-03 DIAGNOSIS — J9620 Acute and chronic respiratory failure, unspecified whether with hypoxia or hypercapnia: Secondary | ICD-10-CM | POA: Diagnosis present

## 2018-08-03 DIAGNOSIS — M25559 Pain in unspecified hip: Secondary | ICD-10-CM | POA: Diagnosis present

## 2018-08-03 DIAGNOSIS — M1990 Unspecified osteoarthritis, unspecified site: Secondary | ICD-10-CM | POA: Diagnosis present

## 2018-08-03 DIAGNOSIS — N400 Enlarged prostate without lower urinary tract symptoms: Secondary | ICD-10-CM | POA: Diagnosis present

## 2018-08-03 DIAGNOSIS — Z9981 Dependence on supplemental oxygen: Secondary | ICD-10-CM | POA: Diagnosis not present

## 2018-08-03 DIAGNOSIS — Z7951 Long term (current) use of inhaled steroids: Secondary | ICD-10-CM | POA: Diagnosis not present

## 2018-08-03 DIAGNOSIS — Z86718 Personal history of other venous thrombosis and embolism: Secondary | ICD-10-CM | POA: Diagnosis not present

## 2018-08-03 DIAGNOSIS — J9621 Acute and chronic respiratory failure with hypoxia: Secondary | ICD-10-CM | POA: Diagnosis present

## 2018-08-03 DIAGNOSIS — Z86711 Personal history of pulmonary embolism: Secondary | ICD-10-CM | POA: Diagnosis not present

## 2018-08-03 DIAGNOSIS — Z7901 Long term (current) use of anticoagulants: Secondary | ICD-10-CM | POA: Diagnosis not present

## 2018-08-03 DIAGNOSIS — I10 Essential (primary) hypertension: Secondary | ICD-10-CM | POA: Diagnosis present

## 2018-08-03 DIAGNOSIS — Z79890 Hormone replacement therapy (postmenopausal): Secondary | ICD-10-CM | POA: Diagnosis not present

## 2018-08-03 DIAGNOSIS — G8929 Other chronic pain: Secondary | ICD-10-CM | POA: Diagnosis present

## 2018-08-03 HISTORY — DX: Acute and chronic respiratory failure, unspecified whether with hypoxia or hypercapnia: J96.20

## 2018-08-03 LAB — RENAL FUNCTION PANEL
Albumin: 3.8 g/dL (ref 3.5–5.0)
Anion gap: 8 (ref 5–15)
BUN: 20 mg/dL (ref 8–23)
CO2: 23 mmol/L (ref 22–32)
Calcium: 9.3 mg/dL (ref 8.9–10.3)
Chloride: 103 mmol/L (ref 98–111)
Creatinine, Ser: 1.57 mg/dL — ABNORMAL HIGH (ref 0.61–1.24)
GFR calc Af Amer: 52 mL/min — ABNORMAL LOW (ref >60)
GFR calc non Af Amer: 45 mL/min — ABNORMAL LOW (ref >60)
Glucose, Bld: 172 mg/dL — ABNORMAL HIGH (ref 70–99)
Phosphorus: 2.9 mg/dL (ref 2.5–4.6)
Potassium: 4.9 mmol/L (ref 3.5–5.1)
Sodium: 134 mmol/L — ABNORMAL LOW (ref 135–145)

## 2018-08-03 MED ORDER — LIP MEDEX EX OINT
TOPICAL_OINTMENT | CUTANEOUS | Status: DC | PRN
  Filled 2018-08-03: qty 7

## 2018-08-03 MED ORDER — METHYLPREDNISOLONE SODIUM SUCC 125 MG IJ SOLR
60.0000 mg | Freq: Three times a day (TID) | INTRAMUSCULAR | Status: DC
  Administered 2018-08-03 – 2018-08-04 (×3): 60 mg via INTRAVENOUS
  Filled 2018-08-03 (×2): qty 2

## 2018-08-03 NOTE — Progress Notes (Signed)
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  Patient presented with shortness of breath and wheezing.  Patient continues to smoke cigarettes.  No associated fever and chills.  Patient has some productive cough.  Patient was admitted for further assessment and management of acute on chronic respiratory failure and COPD with exacerbation.      ED Course: In the ER patient had a CAT scan of the chest which was negative for any new pulmonary embolism and did see resolution of previous pulmonary embolism.  EKG shows normal sinus rhythm with nonspecific ST changes.  Troponin was negative.  Patient was continuing to wheeze despite nebulizer treatment and admitted for acute exacerbation of COPD.  08/03/2018: Patient is slowly improving, but continues to have symptoms.  Patient is still wheezing.  We will continue current management.  Likely, patient may be able to be discharged back on at the next 1 to 2 days.   Assessment & Plan:   Principal Problem:   COPD exacerbation ([place]) Active Problems:   Essential hypertension   Respiratory failure, acute-on-chronic (HCC)  Acute respiratory failure with hypoxia/COPD with exacerbation: Increase the dose of IV Solu-Medrol from 40 mg twice daily to 60 mg every 8 hourly.   Continue neb treatment.   Start flutter valve device therapy.   Continue supplemental oxygen.   Further management will depend on hospital course.    Hypertension: Optimize.  Continue amlodipine.   Pleuritic type of chest pain: CT chest is negative for any new PE.   Continue apixaban. History of PE and DVT noted.  BPH: Continue tamsulosin.  DVT prophylaxis: Apixaban. Code Status: Full code. Family Communication:  Disposition  Plan: Home. Consults called: None.  Procedures:   None.  Antimicrobials:   None   Subjective: Shortness of breath Wheezing. No fever or chills.  Objective: Vitals:   08/03/18 0837 08/03/18 1125 08/03/18 1323 08/03/18 1517  BP:   (!) 146/78   Pulse:  89 89   Resp:  17 20   Temp:   97.9 F (36.6 C)   TempSrc:   Oral   SpO2: 98% 99% 99% 98%  Weight:      Height:        Intake/Output Summary (Last 24 hours) at 08/03/2018 1537 Last data filed at 08/03/2018 1300 Gross per 24 hour  Intake 960 ml  Output 1350 ml  Net -390 ml   Filed Weights   08/02/18 1421  Weight: 76.9 kg    Examination:  General exam: Patient is not in significant distress.  Patient is awake and alert.   Respiratory system: Decreased air entry with expiratory wheeze.   Cardiovascular system: S1 & S2 heard, RRR. No JVD, murmurs, rubs, gallops or clicks. No pedal edema. Gastrointestinal system: Abdomen is nondistended, soft and nontender. No organomegaly or masses felt. Normal bowel sounds heard. Central nervous system: Alert and oriented. No focal neurological deficits. Extremities: No leg edema.  Data Reviewed: I have personally reviewed following labs and imaging studies  CBC: Recent Labs  Lab 08/01/18 2053 08/02/18 0445  WBC 7.1 5.7  NEUTROABS 3.8  --   HGB 14.4 12.9*  HCT 47.1 41.9  MCV 89.4 91.1  PLT 322 841   Basic Metabolic Panel: Recent Labs  Lab 08/01/18 2053 08/02/18 0445  08/03/18 0530  NA 138 133* 134*  K 4.0 4.7 4.9  CL 103 102 103  CO2 25 21* 23  GLUCOSE 108* 170* 172*  BUN 11 12 20   CREATININE 1.36* 1.35* 1.57*  CALCIUM 9.6 8.9 9.3  PHOS  --   --  2.9   GFR: Estimated Creatinine Clearance: 49.7 mL/min (A) (by C-G formula based on SCr of 1.57 mg/dL (H)). Liver Function Tests: Recent Labs  Lab 08/03/18 0530  ALBUMIN 3.8   No results for input(s): LIPASE, AMYLASE in the last 168 hours. No results for input(s): AMMONIA in the last 168 hours. Coagulation  Profile: No results for input(s): INR, PROTIME in the last 168 hours. Cardiac Enzymes: Recent Labs  Lab 08/02/18 0445  TROPONINI <0.03   BNP (last 3 results) No results for input(s): PROBNP in the last 8760 hours. HbA1C: No results for input(s): HGBA1C in the last 72 hours. CBG: No results for input(s): GLUCAP in the last 168 hours. Lipid Profile: No results for input(s): CHOL, HDL, LDLCALC, TRIG, CHOLHDL, LDLDIRECT in the last 72 hours. Thyroid Function Tests: No results for input(s): TSH, T4TOTAL, FREET4, T3FREE, THYROIDAB in the last 72 hours. Anemia Panel: No results for input(s): VITAMINB12, FOLATE, FERRITIN, TIBC, IRON, RETICCTPCT in the last 72 hours. Urine analysis:    Component Value Date/Time   COLORURINE YELLOW 10/20/2017 0824   APPEARANCEUR CLEAR 10/20/2017 0824   LABSPEC 1.011 10/20/2017 0824   PHURINE 6.0 10/20/2017 0824   GLUCOSEU NEGATIVE 10/20/2017 0824   HGBUR SMALL (A) 10/20/2017 0824   BILIRUBINUR NEGATIVE 10/20/2017 0824   KETONESUR NEGATIVE 10/20/2017 0824   PROTEINUR NEGATIVE 10/20/2017 0824   NITRITE NEGATIVE 10/20/2017 0824   LEUKOCYTESUR LARGE (A) 10/20/2017 0824   Sepsis Labs: @LABRCNTIP (procalcitonin:4,lacticidven:4)  )No results found for this or any previous visit (from the past 240 hour(s)).       Radiology Studies: Dg Chest 2 View  Result Date: 08/01/2018 CLINICAL DATA:  Shortness of breath, COPD, asthma. EXAM: CHEST - 2 VIEW COMPARISON:  05/31/2018 FINDINGS: Heart size and pulmonary vascularity are normal. Emphysematous changes and scattered fibrosis in the lungs. Linear nodular scarring or infiltrative changes in the right mid lung are progressing since previous study. Short time frame suggest acute process, possibly pneumonia or atelectasis. Scattered calcified granulomas. No blunting of costophrenic angles. No pneumothorax. Mediastinal contours appear intact. IMPRESSION: 1. Emphysematous changes and fibrosis in the lungs. 2.  Increasing linear nodular infiltration or atelectasis in the right mid lung. Electronically Signed   By: Lucienne Capers M.D.   On: 08/01/2018 21:16   Ct Angio Chest Pe W And/or Wo Contrast  Result Date: 08/02/2018 CLINICAL DATA:  COPD and asthma with dyspnea around 3 p.m. after smoking. EXAM: CT ANGIOGRAPHY CHEST WITH CONTRAST TECHNIQUE: Multidetector CT imaging of the chest was performed using the standard protocol during bolus administration of intravenous contrast. Multiplanar CT image reconstructions and MIPs were obtained to evaluate the vascular anatomy. CONTRAST:  126mL ISOVUE-370 IOPAMIDOL (ISOVUE-370) INJECTION 76% COMPARISON:  05/31/2018 chest CT FINDINGS: Cardiovascular: Conventional branch pattern of the great vessels. Resolution of previously noted filling defect extending into the right upper lobe lobar pulmonary arterial branch and segmental branch vessels. No large central pulmonary embolus is identified. Hypodensity extending into the right lower lobe pulmonary arteries are felt artifactual due to motion and left arm streak artifacts as better visualized on the reformatted images. No obvious aortic dissection. No aortic aneurysm. Atherosclerosis of the aorta and great vessels. Three-vessel coronary arteriosclerosis. Normal heart size  without pericardial effusion or thickening. Mediastinum/Nodes: No enlarged mediastinal, hilar, or axillary lymph nodes. Thyroid gland, trachea, and esophagus demonstrate no significant findings. Lungs/Pleura: Panlobular emphysema throughout both lungs. New foci of subsegmental atelectasis and/or scarring the inferior right upper lobe abutting [place] fissure and extending to the pleura. Atelectasis is also noted medially within the right middle lobe. No effusion or pneumothorax. Upper Abdomen: Dilatation and ectasia of the left included renal collecting system consistent previously described mild left-sided hydronephrosis. Musculoskeletal: No acute nor aggressive  osseous lesions of the bony thorax. Lower cervical spondylosis. Review of the MIP images confirms the above findings. IMPRESSION: 1. Resolution of previously noted right upper lobe lobar pulmonary embolus. No large central pulmonary embolus is identified. 2. Panlobular emphysema with new areas of subsegmental atelectasis and/or scarring in the right upper lobe and right middle lobe. 3. Respiratory motion and left arm streak artifact simulating pulmonary emboli to the right lower lobe are identified. No acute pulmonary embolus is currently noted. Aortic Atherosclerosis (ICD10-I70.0) and Emphysema (ICD10-J43.9). Electronically Signed   By: Ashley Royalty M.D.   On: 08/02/2018 00:39        Scheduled Meds: . amLODipine  2.5 mg Oral Daily  . apixaban  5 mg Oral BID  . budesonide (PULMICORT) nebulizer solution  0.25 mg Nebulization BID  . fluticasone  1 spray Each Nare BID  . gabapentin  300 mg Oral BID  . guaiFENesin  600 mg Oral BID  . ipratropium-albuterol  3 mL Nebulization Q4H  . loratadine  10 mg Oral Daily  . methylPREDNISolone (SOLU-MEDROL) injection  60 mg Intravenous Q8H  . montelukast  10 mg Oral QHS  . pantoprazole  40 mg Oral Daily  . tamsulosin  0.4 mg Oral QPC supper   Continuous Infusions:   LOS: 0 days    Time spent: 25 Minutes.    Dana Allan, MD  [facility] Pager #: [phone] 7PM-7AM contact night coverage as above

## 2018-08-04 DIAGNOSIS — J9621 Acute and chronic respiratory failure with hypoxia: Secondary | ICD-10-CM

## 2018-08-04 LAB — INFLUENZA PANEL BY PCR (TYPE A & B)
Influenza A By PCR: NEGATIVE
Influenza B By PCR: NEGATIVE

## 2018-08-04 MED ORDER — PREDNISONE 20 MG PO TABS: 40.0000 mg | ORAL_TABLET | Freq: Every day | ORAL | 0 refills | Status: DC

## 2018-08-04 MED ORDER — MONTELUKAST SODIUM 10 MG PO TABS: 10.0000 mg | ORAL_TABLET | Freq: Every day | ORAL | 1 refills | Status: DC

## 2018-08-04 MED ORDER — PREDNISONE 20 MG PO TABS: 40.0000 mg | ORAL_TABLET | Freq: Every day | ORAL | Status: DC

## 2018-08-04 MED ORDER — BUDESONIDE-FORMOTEROL FUMARATE 160-4.5 MCG/ACT IN AERO: 1.0000 | INHALATION_SPRAY | Freq: Two times a day (BID) | RESPIRATORY_TRACT | 12 refills | Status: DC

## 2018-08-04 MED ORDER — IPRATROPIUM-ALBUTEROL 0.5-2.5 (3) MG/3ML IN SOLN: 3.0000 mL | Freq: Three times a day (TID) | RESPIRATORY_TRACT | Status: DC

## 2018-08-04 MED ORDER — ALBUTEROL SULFATE HFA 108 (90 BASE) MCG/ACT IN AERS: 2.0000 | INHALATION_SPRAY | RESPIRATORY_TRACT | 2 refills | Status: DC | PRN

## 2018-08-04 MED ORDER — ALBUTEROL SULFATE (2.5 MG/3ML) 0.083% IN NEBU: 2.5000 mg | INHALATION_SOLUTION | Freq: Four times a day (QID) | RESPIRATORY_TRACT | 12 refills | Status: DC | PRN

## 2018-08-04 MED ORDER — PANTOPRAZOLE SODIUM 40 MG PO TBEC: 40.0000 mg | DELAYED_RELEASE_TABLET | Freq: Every day | ORAL | 0 refills | Status: DC

## 2018-08-04 MED ORDER — FLUTICASONE PROPIONATE 50 MCG/ACT NA SUSP: 1.0000 | Freq: Two times a day (BID) | NASAL | 2 refills | Status: DC

## 2018-08-04 NOTE — Consult Note (Signed)
   [facility] CM Inpatient Consult   08/04/2018  Kraven Lowrey June 22, 1951 [id number]    Patient screened for potential [facility] Care Management services due to unplanned readmission risk score of 22% (high) and multiple hospitalizations.   Chart reviewed. Noted Mr. Shvartsman is eligible for [facility] Special Needs Program that offers case management services thru his [facility] insurance. Therefore, [facility] Care Management not appropriate at this time.   Marthenia Rolling, MSN-Ed, RN,BSN [facility] Liaison [phone]

## 2018-08-04 NOTE — Discharge Summary (Signed)
Physician Discharge Summary  Cian Costanzo MRN: [id number] DOB/AGE: October 21, 1950 68 y.o.  PCP: Leighton Ruff, MD   Admit date: 08/01/2018 Discharge date: 08/04/2018  Discharge Diagnoses:    Principal Problem:   COPD exacerbation [facility]) Active Problems:   Essential hypertension   Respiratory failure, acute-on-chronic ([place])    Follow-up recommendations Follow-up with PCP in 3-5 days , including all  additional recommended appointments as below Follow-up CBC, CMP in 3-5 days Chronically oxygen dependent on 2 L at baseline      Allergies as of 08/04/2018   No Known Allergies     Medication List    STOP taking these medications   cetirizine 10 MG tablet Commonly known as:  ZYRTEC   guaiFENesin 600 MG 12 hr tablet Commonly known as:  MUCINEX     TAKE these medications   acetaminophen 500 MG tablet Commonly known as:  TYLENOL Take 1,000 mg by mouth every 6 (six) hours as needed for moderate pain or headache.   albuterol 108 (90 Base) MCG/ACT inhaler Commonly known as:  PROVENTIL HFA;VENTOLIN HFA Inhale 2 puffs into the lungs every 2 (two) hours as needed for wheezing or shortness of breath.   albuterol (2.5 MG/3ML) 0.083% nebulizer solution Commonly known as:  PROVENTIL Take 3 mLs (2.5 mg total) by nebulization every 6 (six) hours as needed for wheezing or shortness of breath.   amLODipine 2.5 MG tablet Commonly known as:  NORVASC Take 2.5 mg by mouth daily.   apixaban 5 MG Tabs tablet Commonly known as:  ELIQUIS Take 1 tablet (5 mg total) by mouth 2 (two) times daily. What changed:  Another medication with the same name was removed. Continue taking this medication, and follow the directions you see here.   budesonide-formoterol 160-4.5 MCG/ACT inhaler Commonly known as:  SYMBICORT Inhale 1 puff into the lungs 2 (two) times daily. What changed:  when to take this   fluticasone 50 MCG/ACT nasal spray Commonly known as:  FLONASE Place 1 spray into both  nostrils 2 (two) times daily.   gabapentin 300 MG capsule Commonly known as:  NEURONTIN Take 300 mg by mouth 2 (two) times daily.   Melatonin 3 MG Tabs Take 3 mg by mouth at bedtime as needed (sleep).   montelukast 10 MG tablet Commonly known as:  SINGULAIR Take 1 tablet (10 mg total) by mouth at bedtime.   pantoprazole 40 MG tablet Commonly known as:  PROTONIX Take 1 tablet (40 mg total) by mouth daily. Start taking on:  August 05, 2018   predniSONE 20 MG tablet Commonly known as:  DELTASONE Take 2 tablets (40 mg total) by mouth daily with breakfast. Start taking on:  August 05, 2018   SPIRIVA RESPIMAT 1.25 MCG/ACT Aers Generic drug:  Tiotropium Bromide Monohydrate Inhale 2 puffs into the lungs daily.   tamsulosin 0.4 MG Caps capsule Commonly known as:  FLOMAX Take 0.4 mg by mouth daily after supper.        Discharge Condition:  stable  Discharge Instructions Get Medicines reviewed and adjusted: Please take all your medications with you for your next visit with your Primary MD  Please request your Primary MD to go over all hospital tests and procedure/radiological results at the follow up, please ask your Primary MD to get all Hospital records sent to his/her office.  If you experience worsening of your admission symptoms, develop shortness of breath, life threatening emergency, suicidal or homicidal thoughts you must seek medical attention immediately by calling 911 or calling  your MD immediately  if symptoms less severe.  You must read complete instructions/literature along with all the possible adverse reactions/side effects for all the Medicines you take and that have been prescribed to you. Take any new Medicines after you have completely understood and accpet all the possible adverse reactions/side effects.   Do not drive when taking Pain medications.   Do not take more than prescribed Pain, Sleep and Anxiety Medications  Special Instructions: If you have  smoked or chewed Tobacco  in the last 2 yrs please stop smoking, stop any regular Alcohol  and or any Recreational drug use.  Wear Seat belts while driving.  Please note  You were cared for by a hospitalist during your hospital stay. Once you are discharged, your primary care physician will handle any further medical issues. Please note that NO REFILLS for any discharge medications will be authorized once you are discharged, as it is imperative that you return to your primary care physician (or establish a relationship with a primary care physician if you do not have one) for your aftercare needs so that they can reassess your need for medications and monitor your lab values.     No Known Allergies    Disposition: Discharge disposition: 01-Home or Self Care        Consults: none    Significant Diagnostic Studies:  Dg Chest 2 View  Result Date: 08/01/2018 CLINICAL DATA:  Shortness of breath, COPD, asthma. EXAM: CHEST - 2 VIEW COMPARISON:  05/31/2018 FINDINGS: Heart size and pulmonary vascularity are normal. Emphysematous changes and scattered fibrosis in the lungs. Linear nodular scarring or infiltrative changes in the right mid lung are progressing since previous study. Short time frame suggest acute process, possibly pneumonia or atelectasis. Scattered calcified granulomas. No blunting of costophrenic angles. No pneumothorax. Mediastinal contours appear intact. IMPRESSION: 1. Emphysematous changes and fibrosis in the lungs. 2. Increasing linear nodular infiltration or atelectasis in the right mid lung. Electronically Signed   By: Lucienne Capers M.D.   On: 08/01/2018 21:16   Ct Angio Chest Pe W And/or Wo Contrast  Result Date: 08/02/2018 CLINICAL DATA:  COPD and asthma with dyspnea around 3 p.m. after smoking. EXAM: CT ANGIOGRAPHY CHEST WITH CONTRAST TECHNIQUE: Multidetector CT imaging of the chest was performed using the standard protocol during bolus administration of intravenous  contrast. Multiplanar CT image reconstructions and MIPs were obtained to evaluate the vascular anatomy. CONTRAST:  136mL ISOVUE-370 IOPAMIDOL (ISOVUE-370) INJECTION 76% COMPARISON:  05/31/2018 chest CT FINDINGS: Cardiovascular: Conventional branch pattern of the great vessels. Resolution of previously noted filling defect extending into the right upper lobe lobar pulmonary arterial branch and segmental branch vessels. No large central pulmonary embolus is identified. Hypodensity extending into the right lower lobe pulmonary arteries are felt artifactual due to motion and left arm streak artifacts as better visualized on the reformatted images. No obvious aortic dissection. No aortic aneurysm. Atherosclerosis of the aorta and great vessels. Three-vessel coronary arteriosclerosis. Normal heart size without pericardial effusion or thickening. Mediastinum/Nodes: No enlarged mediastinal, hilar, or axillary lymph nodes. Thyroid gland, trachea, and esophagus demonstrate no significant findings. Lungs/Pleura: Panlobular emphysema throughout both lungs. New foci of subsegmental atelectasis and/or scarring the inferior right upper lobe abutting [place] fissure and extending to the pleura. Atelectasis is also noted medially within the right middle lobe. No effusion or pneumothorax. Upper Abdomen: Dilatation and ectasia of the left included renal collecting system consistent previously described mild left-sided hydronephrosis. Musculoskeletal: No acute nor aggressive osseous lesions of  the bony thorax. Lower cervical spondylosis. Review of the MIP images confirms the above findings. IMPRESSION: 1. Resolution of previously noted right upper lobe lobar pulmonary embolus. No large central pulmonary embolus is identified. 2. Panlobular emphysema with new areas of subsegmental atelectasis and/or scarring in the right upper lobe and right middle lobe. 3. Respiratory motion and left arm streak artifact simulating pulmonary emboli to the  right lower lobe are identified. No acute pulmonary embolus is currently noted. Aortic Atherosclerosis (ICD10-I70.0) and Emphysema (ICD10-J43.9). Electronically Signed   By: Ashley Royalty M.D.   On: 08/02/2018 00:39      Filed Weights   08/02/18 1421  Weight: 76.9 kg     Microbiology: No results found for this or any previous visit (from the past 240 hour(s)).     Blood Culture    Component Value Date/Time   SDES SPUTUM 10/19/2017 1009   SDES  10/19/2017 1009    SPUTUM Performed at [facility], [place] [address], [place], [place] [address]    [place] 10/19/2017 1009   SPECREQUEST  10/19/2017 1009    NONE Reflexed from [id number] Performed at [facility], [place] [address] [place], [place] [address]    CULT  10/19/2017 1009    MODERATE Consistent with normal respiratory flora. Performed at [facility], [place] [address], [place], [place] [address]    REPTSTATUS 10/19/2017 FINAL 10/19/2017 1009   REPTSTATUS 10/22/2017 FINAL 10/19/2017 1009      Labs: Results for orders placed or performed during the hospital encounter of 08/01/18 (from the past 48 hour(s))  Renal function panel     Status: Abnormal   Collection Time: 08/03/18  5:30 AM  Result Value Ref Range   Sodium 134 (L) 135 - 145 mmol/L   Potassium 4.9 3.5 - 5.1 mmol/L   Chloride 103 98 - 111 mmol/L   CO2 23 22 - 32 mmol/L   Glucose, Bld 172 (H) 70 - 99 mg/dL   BUN 20 8 - 23 mg/dL   Creatinine, Ser 1.57 (H) 0.61 - 1.24 mg/dL   Calcium 9.3 8.9 - 10.3 mg/dL   Phosphorus 2.9 2.5 - 4.6 mg/dL   Albumin 3.8 3.5 - 5.0 g/dL   GFR calc non Af Amer 45 (L) >60 mL/min   GFR calc Af Amer 52 (L) >60 mL/min   Anion gap 8 5 - 15    Comment: Performed at [facility], [place] [address] [place], [place] [address]  Influenza panel by PCR (type A & B)     Status: None   Collection Time: 08/04/18 12:30 PM  Result Value Ref Range   Influenza A By PCR NEGATIVE NEGATIVE    Influenza B By PCR NEGATIVE NEGATIVE    Comment: (NOTE) The Xpert Xpress Flu assay is intended as an aid in the diagnosis of  influenza and should not be used as a sole basis for treatment.  This  assay is FDA approved for nasopharyngeal swab specimens only. Nasal  washings and aspirates are unacceptable for Xpert Xpress Flu testing. Performed at [facility], [place] [address] [place], [place] [address]      Lipid Panel  No results found for: CHOL, TRIG, HDL, CHOLHDL, VLDL, LDLCALC, LDLDIRECT   No results found for: HGBA1C   Lab Results  Component Value Date   CREATININE 1.57 (H) 08/03/2018     HPI :   Keyshon Claude is a 68 y.o. male with history of  COPD, hypertension, recent diagnosis of PE/DVT on apixaban presents to the ER with complaints of shortness of breath over the last 24 hours.  Has been having wheezing and productive cough some pleuritic type of chest pain.  Denies any fever chills nausea vomiting abdominal pain.  Has been compliant with his medications.  ED Course: In the ER patient had a CAT scan of the chest which was negative for any new pulmonary embolism and did see resolution of previous pulmonary embolism.  EKG shows normal sinus rhythm with nonspecific ST changes.  Troponin was negative.  Patient was continuing to wheeze despite nebulizer treatment and admitted for acute exacerbation of COPD.  HOSPITAL COURSE  Acute respiratory failure with hypoxia/COPD with exacerbation: Patient treated with  IV Solu-Medrol  60 mg every 8 hourly.  , now on oral prednisone 50 mg 5 days Continue neb treatment.   refills provided for all inhalers and nebulizers Start flutter valve device therapy.   Continue supplemental oxygen.  he on 2 L at baseline. Currently on 2 L at the time of discharge Influenza panel negative   Hypertension: Optimize.  Continue amlodipine.   Pleuritic type of chest pain: CT chest is negative for any new PE.  Continue  apixaban. History of PE and DVT noted.  BPH: Continue tamsulosin.   Discharge Exam  Blood pressure (!) 145/83, pulse 90, temperature 98.2 F (36.8 C), temperature source Oral, resp. rate 17, height 6\' 1"  (1.854 m), weight 76.9 kg, SpO2 98 %.  Cardiovascular system: S1 & S2 heard, RRR. No JVD, murmurs, rubs, gallops or clicks. No pedal edema. Gastrointestinal system: Abdomen is nondistended, soft and nontender. No organomegaly or masses felt. Normal bowel sounds heard. Central nervous system: Alert and oriented. No focal neurological deficits. Extremities: No leg edema.      Signed: Reyne Dumas 08/04/2018, 1:49 PM      Time needed to  prepare  discharge, discussed with the patient and family 35 minutes

## 2018-08-05 ENCOUNTER — Telehealth (HOSPITAL_COMMUNITY): Payer: Self-pay

## 2018-08-05 DIAGNOSIS — J441 Chronic obstructive pulmonary disease with (acute) exacerbation: Secondary | ICD-10-CM | POA: Diagnosis not present

## 2018-08-05 NOTE — Telephone Encounter (Signed)
Pt is covered through [place], [id number].

## 2018-08-05 NOTE — Telephone Encounter (Signed)
Called patient to see if he was interested in participating in the Pulmonary Rehab Program. Patient stated yes. Patient will come in for orientation on 08/18/2018 @ 1130AM and will attend the 130PM exercise class. Went over insurance, patient verbalized understanding.   Mailed homework package.

## 2018-08-07 DIAGNOSIS — Z720 Tobacco use: Secondary | ICD-10-CM | POA: Diagnosis not present

## 2018-08-07 DIAGNOSIS — E871 Hypo-osmolality and hyponatremia: Secondary | ICD-10-CM | POA: Diagnosis not present

## 2018-08-07 DIAGNOSIS — J441 Chronic obstructive pulmonary disease with (acute) exacerbation: Secondary | ICD-10-CM | POA: Diagnosis not present

## 2018-08-18 ENCOUNTER — Emergency Department (HOSPITAL_COMMUNITY): Payer: Medicare HMO

## 2018-08-18 ENCOUNTER — Encounter (HOSPITAL_COMMUNITY): Payer: Self-pay

## 2018-08-18 ENCOUNTER — Ambulatory Visit (HOSPITAL_COMMUNITY): Payer: No Typology Code available for payment source

## 2018-08-18 ENCOUNTER — Observation Stay (HOSPITAL_COMMUNITY)
Admission: EM | Admit: 2018-08-18 | Discharge: 2018-08-19 | Disposition: A | Discharge status: Home / Self Care | Payer: Medicare HMO | Attending: Internal Medicine | Admitting: Internal Medicine

## 2018-08-18 ENCOUNTER — Other Ambulatory Visit: Payer: Self-pay

## 2018-08-18 DIAGNOSIS — Z79899 Other long term (current) drug therapy: Secondary | ICD-10-CM | POA: Diagnosis not present

## 2018-08-18 DIAGNOSIS — R0602 Shortness of breath: Secondary | ICD-10-CM | POA: Diagnosis not present

## 2018-08-18 DIAGNOSIS — Z66 Do not resuscitate: Secondary | ICD-10-CM | POA: Insufficient documentation

## 2018-08-18 DIAGNOSIS — I2699 Other pulmonary embolism without acute cor pulmonale: Secondary | ICD-10-CM | POA: Insufficient documentation

## 2018-08-18 DIAGNOSIS — N400 Enlarged prostate without lower urinary tract symptoms: Secondary | ICD-10-CM | POA: Insufficient documentation

## 2018-08-18 DIAGNOSIS — R079 Chest pain, unspecified: Secondary | ICD-10-CM | POA: Diagnosis not present

## 2018-08-18 DIAGNOSIS — J9601 Acute respiratory failure with hypoxia: Secondary | ICD-10-CM | POA: Insufficient documentation

## 2018-08-18 DIAGNOSIS — I2510 Atherosclerotic heart disease of native coronary artery without angina pectoris: Secondary | ICD-10-CM | POA: Diagnosis not present

## 2018-08-18 DIAGNOSIS — Z9981 Dependence on supplemental oxygen: Secondary | ICD-10-CM | POA: Diagnosis not present

## 2018-08-18 DIAGNOSIS — Z7901 Long term (current) use of anticoagulants: Secondary | ICD-10-CM | POA: Diagnosis not present

## 2018-08-18 DIAGNOSIS — J441 Chronic obstructive pulmonary disease with (acute) exacerbation: Secondary | ICD-10-CM | POA: Diagnosis not present

## 2018-08-18 DIAGNOSIS — D649 Anemia, unspecified: Secondary | ICD-10-CM | POA: Insufficient documentation

## 2018-08-18 DIAGNOSIS — E1159 Type 2 diabetes mellitus with other circulatory complications: Secondary | ICD-10-CM | POA: Diagnosis present

## 2018-08-18 DIAGNOSIS — F17210 Nicotine dependence, cigarettes, uncomplicated: Secondary | ICD-10-CM | POA: Diagnosis not present

## 2018-08-18 DIAGNOSIS — I10 Essential (primary) hypertension: Secondary | ICD-10-CM | POA: Insufficient documentation

## 2018-08-18 LAB — CBC WITH DIFFERENTIAL/PLATELET
ABS IMMATURE GRANULOCYTES: 0.04 10*3/uL (ref 0.00–0.07)
Basophils Absolute: 0 10*3/uL (ref 0.0–0.1)
Basophils Relative: 0 %
Eosinophils Absolute: 0.1 10*3/uL (ref 0.0–0.5)
Eosinophils Relative: 1 %
HCT: 39.9 % (ref 39.0–52.0)
HEMOGLOBIN: 12 g/dL — AB (ref 13.0–17.0)
IMMATURE GRANULOCYTES: 1 %
LYMPHS PCT: 20 %
Lymphs Abs: 1.7 10*3/uL (ref 0.7–4.0)
MCH: 27 pg (ref 26.0–34.0)
MCHC: 30.1 g/dL (ref 30.0–36.0)
MCV: 89.9 fL (ref 80.0–100.0)
Monocytes Absolute: 0.7 10*3/uL (ref 0.1–1.0)
Monocytes Relative: 8 %
Neutro Abs: 6.1 10*3/uL (ref 1.7–7.7)
Neutrophils Relative %: 70 %
Platelets: 164 10*3/uL (ref 150–400)
RBC: 4.44 MIL/uL (ref 4.22–5.81)
RDW: 15.3 % (ref 11.5–15.5)
WBC: 8.5 10*3/uL (ref 4.0–10.5)
nRBC: 0 % (ref 0.0–0.2)

## 2018-08-18 LAB — TROPONIN I: Troponin I: <0.03 ng/mL (ref <0.03)

## 2018-08-18 LAB — BASIC METABOLIC PANEL
Anion gap: 9 (ref 5–15)
BUN: 13 mg/dL (ref 8–23)
CHLORIDE: 107 mmol/L (ref 98–111)
CO2: 22 mmol/L (ref 22–32)
Calcium: 8.9 mg/dL (ref 8.9–10.3)
Creatinine, Ser: 1.37 mg/dL — ABNORMAL HIGH (ref 0.61–1.24)
GFR calc Af Amer: >60 mL/min (ref >60)
GFR calc non Af Amer: 53 mL/min — ABNORMAL LOW (ref >60)
Glucose, Bld: 129 mg/dL — ABNORMAL HIGH (ref 70–99)
Potassium: 4.1 mmol/L (ref 3.5–5.1)
Sodium: 138 mmol/L (ref 135–145)

## 2018-08-18 LAB — I-STAT TROPONIN, ED: Troponin i, poc: 0.01 ng/mL (ref 0.00–0.08)

## 2018-08-18 LAB — MAGNESIUM: MAGNESIUM: 2.2 mg/dL (ref 1.7–2.4)

## 2018-08-18 MED ORDER — IPRATROPIUM-ALBUTEROL 0.5-2.5 (3) MG/3ML IN SOLN: 3.0000 mL | RESPIRATORY_TRACT | Status: DC

## 2018-08-18 MED ORDER — ALBUTEROL SULFATE (2.5 MG/3ML) 0.083% IN NEBU
5.0000 mg | INHALATION_SOLUTION | Freq: Once | RESPIRATORY_TRACT | Status: AC
  Administered 2018-08-18: 5 mg via RESPIRATORY_TRACT
  Filled 2018-08-18: qty 6

## 2018-08-18 MED ORDER — SODIUM CHLORIDE 0.9 % IV SOLN
100.0000 mg | Freq: Two times a day (BID) | INTRAVENOUS | Status: DC
  Filled 2018-08-18 (×2): qty 100

## 2018-08-18 MED ORDER — MELATONIN 3 MG PO TABS
3.0000 mg | ORAL_TABLET | Freq: Every evening | ORAL | Status: DC | PRN
  Filled 2018-08-18: qty 1

## 2018-08-18 MED ORDER — LORAZEPAM 2 MG/ML IJ SOLN
0.5000 mg | Freq: Once | INTRAMUSCULAR | Status: AC
  Administered 2018-08-18: 0.5 mg via INTRAVENOUS
  Filled 2018-08-18: qty 1

## 2018-08-18 MED ORDER — METHYLPREDNISOLONE SODIUM SUCC 40 MG IJ SOLR
40.0000 mg | Freq: Two times a day (BID) | INTRAMUSCULAR | Status: DC
  Administered 2018-08-18 – 2018-08-19 (×3): 40 mg via INTRAVENOUS
  Filled 2018-08-18 (×3): qty 1

## 2018-08-18 MED ORDER — IPRATROPIUM BROMIDE 0.02 % IN SOLN
0.5000 mg | Freq: Once | RESPIRATORY_TRACT | Status: AC
  Administered 2018-08-18: 0.5 mg via RESPIRATORY_TRACT
  Filled 2018-08-18: qty 2.5

## 2018-08-18 MED ORDER — IPRATROPIUM BROMIDE 0.02 % IN SOLN
0.5000 mg | RESPIRATORY_TRACT | Status: DC
  Administered 2018-08-18: 0.5 mg via RESPIRATORY_TRACT
  Filled 2018-08-18: qty 2.5

## 2018-08-18 MED ORDER — ALBUTEROL SULFATE (2.5 MG/3ML) 0.083% IN NEBU
2.5000 mg | INHALATION_SOLUTION | RESPIRATORY_TRACT | Status: DC
  Administered 2018-08-18: 2.5 mg via RESPIRATORY_TRACT
  Filled 2018-08-18: qty 3

## 2018-08-18 MED ORDER — IPRATROPIUM-ALBUTEROL 0.5-2.5 (3) MG/3ML IN SOLN
3.0000 mL | RESPIRATORY_TRACT | Status: DC
  Administered 2018-08-18 (×3): 3 mL via RESPIRATORY_TRACT
  Filled 2018-08-18 (×4): qty 3

## 2018-08-18 MED ORDER — ACETAMINOPHEN 650 MG RE SUPP: 650.0000 mg | Freq: Four times a day (QID) | RECTAL | Status: DC | PRN

## 2018-08-18 MED ORDER — AMLODIPINE BESYLATE 5 MG PO TABS
2.5000 mg | ORAL_TABLET | Freq: Every day | ORAL | Status: DC
  Administered 2018-08-18 – 2018-08-19 (×2): 2.5 mg via ORAL
  Filled 2018-08-18 (×2): qty 1

## 2018-08-18 MED ORDER — SODIUM CHLORIDE 0.9 % IV SOLN
100.0000 mg | Freq: Two times a day (BID) | INTRAVENOUS | Status: DC
  Administered 2018-08-18 – 2018-08-19 (×2): 100 mg via INTRAVENOUS
  Filled 2018-08-18 (×3): qty 100

## 2018-08-18 MED ORDER — ACETAMINOPHEN 325 MG PO TABS: 650.0000 mg | ORAL_TABLET | Freq: Four times a day (QID) | ORAL | Status: DC | PRN

## 2018-08-18 MED ORDER — APIXABAN 5 MG PO TABS
5.0000 mg | ORAL_TABLET | Freq: Two times a day (BID) | ORAL | Status: DC
  Administered 2018-08-18 – 2018-08-19 (×3): 5 mg via ORAL
  Filled 2018-08-18 (×4): qty 1

## 2018-08-18 MED ORDER — ALBUTEROL SULFATE (2.5 MG/3ML) 0.083% IN NEBU: 2.5000 mg | INHALATION_SOLUTION | RESPIRATORY_TRACT | Status: DC | PRN

## 2018-08-18 MED ORDER — PANTOPRAZOLE SODIUM 40 MG PO TBEC
40.0000 mg | DELAYED_RELEASE_TABLET | Freq: Every day | ORAL | Status: DC
  Administered 2018-08-18 – 2018-08-19 (×2): 40 mg via ORAL
  Filled 2018-08-18 (×2): qty 1

## 2018-08-18 MED ORDER — FLUTICASONE PROPIONATE 50 MCG/ACT NA SUSP
1.0000 | Freq: Two times a day (BID) | NASAL | Status: DC
  Administered 2018-08-18 – 2018-08-19 (×3): 1 via NASAL
  Filled 2018-08-18: qty 16

## 2018-08-18 MED ORDER — BUDESONIDE 0.25 MG/2ML IN SUSP
0.2500 mg | Freq: Two times a day (BID) | RESPIRATORY_TRACT | Status: DC
  Administered 2018-08-18 – 2018-08-19 (×3): 0.25 mg via RESPIRATORY_TRACT
  Filled 2018-08-18 (×3): qty 2

## 2018-08-18 MED ORDER — GABAPENTIN 300 MG PO CAPS
300.0000 mg | ORAL_CAPSULE | Freq: Two times a day (BID) | ORAL | Status: DC
  Administered 2018-08-18 – 2018-08-19 (×3): 300 mg via ORAL
  Filled 2018-08-18 (×3): qty 1

## 2018-08-18 MED ORDER — MONTELUKAST SODIUM 10 MG PO TABS
10.0000 mg | ORAL_TABLET | Freq: Every day | ORAL | Status: DC
  Administered 2018-08-18: 10 mg via ORAL
  Filled 2018-08-18 (×2): qty 1

## 2018-08-18 MED ORDER — ONDANSETRON HCL 4 MG PO TABS: 4.0000 mg | ORAL_TABLET | Freq: Four times a day (QID) | ORAL | Status: DC | PRN

## 2018-08-18 MED ORDER — TAMSULOSIN HCL 0.4 MG PO CAPS
0.4000 mg | ORAL_CAPSULE | Freq: Every day | ORAL | Status: DC
  Administered 2018-08-18: 0.4 mg via ORAL
  Filled 2018-08-18: qty 1

## 2018-08-18 MED ORDER — ONDANSETRON HCL 4 MG/2ML IJ SOLN: 4.0000 mg | Freq: Four times a day (QID) | INTRAMUSCULAR | Status: DC | PRN

## 2018-08-18 NOTE — H&P (Signed)
History and Physical    Gregory Christensen CHE:[id number] DOB: 06-Jan-1951 DOA: 08/18/2018  PCP: Leighton Ruff, MD  Patient coming from: Home.  Chief Complaint: Shortness of breath.  HPI: Gregory Christensen is a 68 y.o. male with history of COPD, pulmonary embolism, hypertension who was just recently admitted to the hospital and discharged on August 04, 2018 2 weeks ago states that he was doing fine until 2 days ago when he became short of breath with wheezing again.  Denies any chest pain has been exam productive cough denies any fever chills.  ED Course: In the ER patient was found to be wheezing despite multiple doses of nebulizer and also dose of steroids.  EKG was showing normal sinus rhythm chest x-ray was unremarkable.  Patient admitted for COPD exacerbation.  Review of Systems: As per HPI, rest all negative.   Past Medical History:  Diagnosis Date  . Arthritis   . Chronic back pain   . Chronic hip pain   . COPD (chronic obstructive pulmonary disease) ([place])   . Emphysema lung ([place])     History reviewed. No pertinent surgical history.   reports that he quit smoking about 10 months ago. His smoking use included cigarettes. He has a 26.00 pack-year smoking history. He has never used smokeless tobacco. He reports current alcohol use. He reports current drug use. Drug: Flunitrazepam.  No Known Allergies  History reviewed. No pertinent family history.  Prior to Admission medications   Medication Sig Start Date End Date Taking? Authorizing Provider  acetaminophen (TYLENOL) 500 MG tablet Take 1,000 mg by mouth every 6 (six) hours as needed for moderate pain or headache.    [provider]  albuterol (PROVENTIL HFA;VENTOLIN HFA) 108 (90 Base) MCG/ACT inhaler Inhale 2 puffs into the lungs every 2 (two) hours as needed for wheezing or shortness of breath. 08/04/18   Reyne Dumas, MD  albuterol (PROVENTIL) (2.5 MG/3ML) 0.083% nebulizer solution Take 3 mLs (2.5 mg total) by  nebulization every 6 (six) hours as needed for wheezing or shortness of breath. 08/04/18   Reyne Dumas, MD  amLODipine (NORVASC) 2.5 MG tablet Take 2.5 mg by mouth daily.    [provider]  apixaban (ELIQUIS) 5 MG TABS tablet Take 1 tablet (5 mg total) by mouth 2 (two) times daily. 06/10/18   Regalado, Belkys A, MD  budesonide-formoterol (SYMBICORT) 160-4.5 MCG/ACT inhaler Inhale 1 puff into the lungs 2 (two) times daily. 08/04/18   Reyne Dumas, MD  fluticasone (FLONASE) 50 MCG/ACT nasal spray Place 1 spray into both nostrils 2 (two) times daily. 08/04/18   Reyne Dumas, MD  gabapentin (NEURONTIN) 300 MG capsule Take 300 mg by mouth 2 (two) times daily.     [provider]  Melatonin 3 MG TABS Take 3 mg by mouth at bedtime as needed (sleep).     [provider]  montelukast (SINGULAIR) 10 MG tablet Take 1 tablet (10 mg total) by mouth at bedtime. 08/04/18   Reyne Dumas, MD  pantoprazole (PROTONIX) 40 MG tablet Take 1 tablet (40 mg total) by mouth daily. 08/05/18   Reyne Dumas, MD  predniSONE (DELTASONE) 20 MG tablet Take 2 tablets (40 mg total) by mouth daily with breakfast. 08/05/18   Reyne Dumas, MD  tamsulosin (FLOMAX) 0.4 MG CAPS capsule Take 0.4 mg by mouth daily after supper.    [provider]  Tiotropium Bromide Monohydrate (SPIRIVA RESPIMAT) 1.25 MCG/ACT AERS Inhale 2 puffs into the lungs daily.    [provider]  Physical Exam: Vitals:   08/18/18 0500 08/18/18 0525 08/18/18 0530 08/18/18 0559  BP: (!) 143/69  118/70 118/70  Pulse: (!) 102  94 96  Resp: 19  (!) 23 (!) 25  Temp:      TempSrc:      SpO2: 99% 98% 100% 98%      Constitutional: Moderately built and nourished. Vitals:   08/18/18 0500 08/18/18 0525 08/18/18 0530 08/18/18 0559  BP: (!) 143/69  118/70 118/70  Pulse: (!) 102  94 96  Resp: 19  (!) 23 (!) 25  Temp:      TempSrc:      SpO2: 99% 98% 100% 98%   Eyes: Nonicteric no pallor. ENMT: No discharge from the ears  eyes nose and mouth. Neck: No mass felt.  No neck rigidity. Respiratory: Bilateral expiratory wheeze heard.  No crepitation. Cardiovascular: S1-S2 heard. Abdomen: Soft nontender bowel sounds present. Musculoskeletal: No edema. Skin: No rash. Neurologic: Alert awake oriented to time place and person.  Moves all extremities. Psychiatric: Appears normal.  Normal affect.   Labs on Admission: I have personally reviewed following labs and imaging studies  CBC: Recent Labs  Lab 08/18/18 0409  WBC 8.5  NEUTROABS 6.1  HGB 12.0*  HCT 39.9  MCV 89.9  PLT 086   Basic Metabolic Panel: Recent Labs  Lab 08/18/18 0409  NA 138  K 4.1  CL 107  CO2 22  GLUCOSE 129*  BUN 13  CREATININE 1.37*  CALCIUM 8.9   GFR: CrCl cannot be calculated (Unknown ideal weight.). Liver Function Tests: No results for input(s): AST, ALT, ALKPHOS, BILITOT, PROT, ALBUMIN in the last 168 hours. No results for input(s): LIPASE, AMYLASE in the last 168 hours. No results for input(s): AMMONIA in the last 168 hours. Coagulation Profile: No results for input(s): INR, PROTIME in the last 168 hours. Cardiac Enzymes: No results for input(s): CKTOTAL, CKMB, CKMBINDEX, TROPONINI in the last 168 hours. BNP (last 3 results) No results for input(s): PROBNP in the last 8760 hours. HbA1C: No results for input(s): HGBA1C in the last 72 hours. CBG: No results for input(s): GLUCAP in the last 168 hours. Lipid Profile: No results for input(s): CHOL, HDL, LDLCALC, TRIG, CHOLHDL, LDLDIRECT in the last 72 hours. Thyroid Function Tests: No results for input(s): TSH, T4TOTAL, FREET4, T3FREE, THYROIDAB in the last 72 hours. Anemia Panel: No results for input(s): VITAMINB12, FOLATE, FERRITIN, TIBC, IRON, RETICCTPCT in the last 72 hours. Urine analysis:    Component Value Date/Time   COLORURINE YELLOW 10/20/2017 0824   APPEARANCEUR CLEAR 10/20/2017 0824   LABSPEC 1.011 10/20/2017 0824   PHURINE 6.0 10/20/2017 0824    GLUCOSEU NEGATIVE 10/20/2017 0824   HGBUR SMALL (A) 10/20/2017 0824   BILIRUBINUR NEGATIVE 10/20/2017 0824   KETONESUR NEGATIVE 10/20/2017 0824   PROTEINUR NEGATIVE 10/20/2017 0824   NITRITE NEGATIVE 10/20/2017 0824   LEUKOCYTESUR LARGE (A) 10/20/2017 0824   Sepsis Labs: @LABRCNTIP (procalcitonin:4,lacticidven:4) )No results found for this or any previous visit (from the past 240 hour(s)).   Radiological Exams on Admission: Dg Chest Portable 1 View  Result Date: 08/18/2018 CLINICAL DATA:  Shortness of breath and COPD EXAM: PORTABLE CHEST 1 VIEW COMPARISON:  08/01/2018 FINDINGS: Normal heart size and mediastinal contours. No acute infiltrate or edema. Mild scarring in the right mid lung which was evaluated by CT earlier this month. No effusion or pneumothorax. No acute osseous findings. IMPRESSION: Emphysema without acute superimposed finding. Electronically Signed   By: Monte Fantasia M.D.   On:  08/18/2018 04:21    EKG: Independently reviewed.  Normal sinus rhythm.  Assessment/Plan Active Problems:   COPD exacerbation ([facility])   Essential hypertension   Pulmonary embolism ([place])   Acute hypoxemic respiratory failure ([place])    1. Acute respiratory failure with hypoxia secondary to COPD exacerbation for which patient is placed on duo nebs Pulmicort and since patient has productive cough patient is placed on doxycycline.  Solu-Medrol 40 mg IV every 12. 2. Hypertension on amlodipine. 3. 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 4. Normocytic normochromic anemia -hemoglobin appears to be at baseline compared to oh 2 weeks ago.  Follow CBC further work-up as outpatient. 5. BPH on tamsulosin.   DVT prophylaxis: Apixaban. Code Status: Full code. Family Communication: Discussed with patient. Disposition Plan: Home. Consults called: None. Admission status: Observation.   Rise Patience MD [facility] Pager [phone].  If 7PM-7AM, please contact  night-coverage [email or web address] Password [facility]  08/18/2018, 6:35 AM

## 2018-08-18 NOTE — ED Provider Notes (Signed)
TIME SEEN: 3:46 AM  CHIEF COMPLAINT: COPD exacerbation  HPI: Patient is a 68 year old male with history of COPD on 2 L nasal cannula chronically who presents to the emergency department with a COPD exacerbation.  Patient comes in by EMS.  States he started feeling short of breath at 10:30 PM last night and having central chest pressure.  Mild dry cough.  No fever.  No lower extremity swelling or pain.  Given 2 duo nebs with EMS and 125 mg of IV Solu-Medrol.  ROS: See HPI Constitutional: no fever  Eyes: no drainage  ENT: no runny nose   Cardiovascular:   chest pain  Resp: SOB  GI: no vomiting GU: no dysuria Integumentary: no rash  Allergy: no hives  Musculoskeletal: no leg swelling  Neurological: no slurred speech ROS otherwise negative  PAST MEDICAL HISTORY/PAST SURGICAL HISTORY:  Past Medical History:  Diagnosis Date  . Arthritis   . Chronic back pain   . Chronic hip pain   . COPD (chronic obstructive pulmonary disease) ([place])   . Emphysema lung ([facility])     MEDICATIONS:  Prior to Admission medications   Medication Sig Start Date End Date Taking? Authorizing Provider  acetaminophen (TYLENOL) 500 MG tablet Take 1,000 mg by mouth every 6 (six) hours as needed for moderate pain or headache.    [provider]  albuterol (PROVENTIL HFA;VENTOLIN HFA) 108 (90 Base) MCG/ACT inhaler Inhale 2 puffs into the lungs every 2 (two) hours as needed for wheezing or shortness of breath. 08/04/18   Reyne Dumas, MD  albuterol (PROVENTIL) (2.5 MG/3ML) 0.083% nebulizer solution Take 3 mLs (2.5 mg total) by nebulization every 6 (six) hours as needed for wheezing or shortness of breath. 08/04/18   Reyne Dumas, MD  amLODipine (NORVASC) 2.5 MG tablet Take 2.5 mg by mouth daily.    [provider]  apixaban (ELIQUIS) 5 MG TABS tablet Take 1 tablet (5 mg total) by mouth 2 (two) times daily. 06/10/18   Regalado, Belkys A, MD  budesonide-formoterol (SYMBICORT) 160-4.5 MCG/ACT inhaler Inhale 1  puff into the lungs 2 (two) times daily. 08/04/18   Reyne Dumas, MD  fluticasone (FLONASE) 50 MCG/ACT nasal spray Place 1 spray into both nostrils 2 (two) times daily. 08/04/18   Reyne Dumas, MD  gabapentin (NEURONTIN) 300 MG capsule Take 300 mg by mouth 2 (two) times daily.     [provider]  Melatonin 3 MG TABS Take 3 mg by mouth at bedtime as needed (sleep).     [provider]  montelukast (SINGULAIR) 10 MG tablet Take 1 tablet (10 mg total) by mouth at bedtime. 08/04/18   Reyne Dumas, MD  pantoprazole (PROTONIX) 40 MG tablet Take 1 tablet (40 mg total) by mouth daily. 08/05/18   Reyne Dumas, MD  predniSONE (DELTASONE) 20 MG tablet Take 2 tablets (40 mg total) by mouth daily with breakfast. 08/05/18   Reyne Dumas, MD  tamsulosin (FLOMAX) 0.4 MG CAPS capsule Take 0.4 mg by mouth daily after supper.    [provider]  Tiotropium Bromide Monohydrate (SPIRIVA RESPIMAT) 1.25 MCG/ACT AERS Inhale 2 puffs into the lungs daily.    [provider]    ALLERGIES:  No Known Allergies  SOCIAL HISTORY:  Social History   Tobacco Use  . Smoking status: Former Smoker    Packs/day: 1.00    Years: 26.00    Pack years: 26.00    Types: Cigarettes    Last attempt to quit: 10/18/2017    Years  since quitting: 0.8  . Smokeless tobacco: Never Used  Substance Use Topics  . Alcohol use: Yes    FAMILY HISTORY: History reviewed. No pertinent family history.  EXAM: BP (!) 165/79 (BP Location: Right Arm)   Pulse 86   Temp 97.6 F (36.4 C) (Oral)   Resp 17   SpO2 100%  CONSTITUTIONAL: Alert and oriented and responds appropriately to questions. Well-appearing; well-nourished HEAD: Normocephalic EYES: Conjunctivae clear, pupils appear equal, EOMI ENT: normal nose; moist mucous membranes NECK: Supple, no meningismus, no nuchal rigidity, no LAD  CARD: RRR; S1 and S2 appreciated; no murmurs, no clicks, no rubs, no gallops RESP: Slightly diminished aeration at bases  bilaterally, mild scattered expiratory wheezes, no rhonchi or rales, speaking full sentences, currently receiving breathing treatment, no hypoxia ABD/GI: Normal bowel sounds; non-distended; soft, non-tender, no rebound, no guarding, no peritoneal signs, no hepatosplenomegaly BACK:  The back appears normal and is non-tender to palpation, there is no CVA tenderness EXT: Normal ROM in all joints; non-tender to palpation; no edema; normal capillary refill; no cyanosis, no calf tenderness or swelling    SKIN: Normal color for age and race; warm; no rash NEURO: Moves all extremities equally PSYCH: The patient's mood and manner are appropriate. Grooming and personal hygiene are appropriate.  MEDICAL DECISION MAKING: Patient here with COPD exacerbation.  Reports already feeling better.  Does have some chest discomfort is likely related to his COPD but will obtain troponin, chest x-ray and EKG.  Doubt ACS, PE or dissection.  Received steroids with EMS.  ED PROGRESS: Labs unremarkable.  Troponin negative.  Chest x-ray clear but shows emphysematous changes.  EKG shows no ischemic abnormality.  Will ambulate patient after breathing treatment in the ED.  5:10 AM  I went to reevaluate the patient and he actually sounds worse.  He now sounds tighter with diffuse expiratory wheezes.  He states he is feeling worse.  Will give another breathing treatment but I feel he will need admission for observation.  He agrees.   5:45 AM Discussed patient's case with hospitalist, Dr. Hal Hope.  I have recommended admission and patient (and family if present) agree with this plan. Admitting physician will place admission orders.   I reviewed all nursing notes, vitals, pertinent previous records, EKGs, lab and urine results, imaging (as available).     EKG Interpretation  Date/Time:  Monday August 18 2018 04:08:12 EST Ventricular Rate:  91 PR Interval:    QRS Duration: 83 QT Interval:  382 QTC Calculation: 470 R  Axis:   35 Text Interpretation:  Sinus rhythm No significant change since last tracing Confirmed by Advay Volante, Cyril Mourning [phone]) on 08/18/2018 4:39:45 AM          Joziah Dollins, Delice Bison, DO 08/18/18 9509

## 2018-08-18 NOTE — Plan of Care (Signed)
Patient admitted this am with copd ex.continue current management

## 2018-08-18 NOTE — ED Notes (Signed)
Bed: QS01 Expected date:  Expected time:  Means of arrival:  Comments: EMS COPD wheezing

## 2018-08-18 NOTE — ED Triage Notes (Signed)
Pt arrived due to a COPD exacerbation that started at roughly 3am. Attempted home albuterol with no relief. Pt had 2 deuoneb and 125 of solumedrol.

## 2018-08-18 NOTE — ED Notes (Signed)
ED TO INPATIENT HANDOFF REPORT  Name/Age/Gender Gregory Christensen 68 y.o. male  Code Status Code Status History    Date Active Date Inactive Code Status Order ID Comments User Context   08/02/2018 6301 08/04/2018 1902 DNR [id number]  Rise Patience, MD ED   05/31/2018 1258 06/04/2018 1718 DNR [id number]  Cristal Deer, MD Inpatient   05/02/2018 2211 05/04/2018 1735 Full Code [id number]  Gwynne Edinger, MD Inpatient   10/18/2017 1932 10/24/2017 1449 Full Code [id number]  Kayleen Memos, DO Inpatient    Questions for Most Recent Historical Code Status (Order [id number])    Question Answer Comment   In the event of cardiac or respiratory ARREST Do not call a "code blue"    In the event of cardiac or respiratory ARREST Do not perform Intubation, CPR, defibrillation or ACLS    In the event of cardiac or respiratory ARREST Use medication by any route, position, wound care, and other measures to relive pain and suffering. May use oxygen, suction and manual treatment of airway obstruction as needed for comfort.    Comments DNR       Home/SNF/Other Home  Chief Complaint copd  Level of Care/Admitting Diagnosis ED Disposition    ED Disposition Condition Comment   Admit  Hospital Area: [facility] [[id number]]  Level of Care: Telemetry [5]  Admit to tele based on following criteria: Monitor for Ischemic changes  Diagnosis: COPD exacerbation [facility]) [[id number]]  Admitting Physician: Rise Patience [phone]  Attending Physician: Rise Patience [email or web address]  PT Class (Do Not Modify): Observation [104]  PT Acc Code (Do Not Modify): Observation [10022]       Medical History Past Medical History:  Diagnosis Date  . Arthritis   . Chronic back pain   . Chronic hip pain   . COPD (chronic obstructive pulmonary disease) ([place])   . Emphysema lung ([place])     Allergies No Known Allergies  IV Location/Drains/Wounds Patient Lines/Drains/Airways Status   Active  Line/Drains/Airways    Name:   Placement date:   Placement time:   Site:   Days:   Peripheral IV 08/18/18 Left Forearm   08/18/18    0339    Forearm   less than 1          Labs/Imaging Results for orders placed or performed during the hospital encounter of 08/18/18 (from the past 48 hour(s))  CBC with Differential     Status: Abnormal   Collection Time: 08/18/18  4:09 AM  Result Value Ref Range   WBC 8.5 4.0 - 10.5 K/uL   RBC 4.44 4.22 - 5.81 MIL/uL   Hemoglobin 12.0 (L) 13.0 - 17.0 g/dL   HCT 39.9 39.0 - 52.0 %   MCV 89.9 80.0 - 100.0 fL   MCH 27.0 26.0 - 34.0 pg   MCHC 30.1 30.0 - 36.0 g/dL   RDW 15.3 11.5 - 15.5 %   Platelets 164 150 - 400 K/uL   nRBC 0.0 0.0 - 0.2 %   Neutrophils Relative % 70 %   Neutro Abs 6.1 1.7 - 7.7 K/uL   Lymphocytes Relative 20 %   Lymphs Abs 1.7 0.7 - 4.0 K/uL   Monocytes Relative 8 %   Monocytes Absolute 0.7 0.1 - 1.0 K/uL   Eosinophils Relative 1 %   Eosinophils Absolute 0.1 0.0 - 0.5 K/uL   Basophils Relative 0 %   Basophils Absolute 0.0 0.0 - 0.1 K/uL   Immature Granulocytes 1 %   Abs  Immature Granulocytes 0.04 0.00 - 0.07 K/uL    Comment: Performed at [facility], [place] [address], [place], [place] [address]  Basic metabolic panel     Status: Abnormal   Collection Time: 08/18/18  4:09 AM  Result Value Ref Range   Sodium 138 135 - 145 mmol/L   Potassium 4.1 3.5 - 5.1 mmol/L   Chloride 107 98 - 111 mmol/L   CO2 22 22 - 32 mmol/L   Glucose, Bld 129 (H) 70 - 99 mg/dL   BUN 13 8 - 23 mg/dL   Creatinine, Ser 1.37 (H) 0.61 - 1.24 mg/dL   Calcium 8.9 8.9 - 10.3 mg/dL   GFR calc non Af Amer 53 (L) >60 mL/min   GFR calc Af Amer >60 >60 mL/min   Anion gap 9 5 - 15    Comment: Performed at [facility], [place] [address], [place], [place] [address]  I-stat troponin, ED     Status: None   Collection Time: 08/18/18  4:13 AM  Result Value Ref Range   Troponin i, poc 0.01 0.00 - 0.08 ng/mL   Comment 3             Comment: Due to the release kinetics of cTnI, a negative result within the first hours of the onset of symptoms does not rule out myocardial infarction with certainty. If myocardial infarction is still suspected, repeat the test at appropriate intervals.    Dg Chest Portable 1 View  Result Date: 08/18/2018 CLINICAL DATA:  Shortness of breath and COPD EXAM: PORTABLE CHEST 1 VIEW COMPARISON:  08/01/2018 FINDINGS: Normal heart size and mediastinal contours. No acute infiltrate or edema. Mild scarring in the right mid lung which was evaluated by CT earlier this month. No effusion or pneumothorax. No acute osseous findings. IMPRESSION: Emphysema without acute superimposed finding. Electronically Signed   By: Monte Fantasia M.D.   On: 08/18/2018 04:21   EKG Interpretation  Date/Time:  Monday August 18 2018 04:08:12 EST Ventricular Rate:  91 PR Interval:    QRS Duration: 83 QT Interval:  382 QTC Calculation: 470 R Axis:   35 Text Interpretation:  Sinus rhythm No significant change since last tracing Confirmed by Pryor Curia [phone]) on 08/18/2018 4:39:45 AM   Pending Labs Unresulted Labs (From admission, onward)   None      Vitals/Pain Today's Vitals   08/18/18 0500 08/18/18 0525 08/18/18 0530 08/18/18 0559  BP: (!) 143/69  118/70 118/70  Pulse: (!) 102  94 96  Resp: 19  (!) 23 (!) 25  Temp:      TempSrc:      SpO2: 99% 98% 100% 98%  PainSc:        Isolation Precautions No active isolations  Medications Medications  albuterol (PROVENTIL) (2.5 MG/3ML) 0.083% nebulizer solution 5 mg (5 mg Nebulization Given 08/18/18 0414)  ipratropium (ATROVENT) nebulizer solution 0.5 mg (0.5 mg Nebulization Given 08/18/18 0407)  LORazepam (ATIVAN) injection 0.5 mg (0.5 mg Intravenous Given 08/18/18 0456)  albuterol (PROVENTIL) (2.5 MG/3ML) 0.083% nebulizer solution 5 mg (5 mg Nebulization Given 08/18/18 0525)    Mobility walks

## 2018-08-18 NOTE — ED Notes (Signed)
Patient very anxious and complaining of increased SOB-treatment just finished and patient placed on O2 2l/[place]-stood to use urinal-acutely anxious with his SOB-orders obtained and Ativan 0.5 mg IV administered

## 2018-08-19 DIAGNOSIS — J441 Chronic obstructive pulmonary disease with (acute) exacerbation: Secondary | ICD-10-CM | POA: Diagnosis not present

## 2018-08-19 DIAGNOSIS — I10 Essential (primary) hypertension: Secondary | ICD-10-CM | POA: Diagnosis not present

## 2018-08-19 DIAGNOSIS — J9601 Acute respiratory failure with hypoxia: Secondary | ICD-10-CM | POA: Diagnosis not present

## 2018-08-19 LAB — BASIC METABOLIC PANEL
Anion gap: 10 (ref 5–15)
BUN: 18 mg/dL (ref 8–23)
CALCIUM: 9.3 mg/dL (ref 8.9–10.3)
CHLORIDE: 104 mmol/L (ref 98–111)
CO2: 20 mmol/L — ABNORMAL LOW (ref 22–32)
Creatinine, Ser: 1.47 mg/dL — ABNORMAL HIGH (ref 0.61–1.24)
GFR calc Af Amer: 56 mL/min — ABNORMAL LOW (ref >60)
GFR, EST NON AFRICAN AMERICAN: 49 mL/min — AB (ref >60)
Glucose, Bld: 155 mg/dL — ABNORMAL HIGH (ref 70–99)
Potassium: 4.7 mmol/L (ref 3.5–5.1)
Sodium: 134 mmol/L — ABNORMAL LOW (ref 135–145)

## 2018-08-19 LAB — CBC
HCT: 39 % (ref 39.0–52.0)
Hemoglobin: 11.8 g/dL — ABNORMAL LOW (ref 13.0–17.0)
MCH: 27.1 pg (ref 26.0–34.0)
MCHC: 30.3 g/dL (ref 30.0–36.0)
MCV: 89.7 fL (ref 80.0–100.0)
Platelets: 191 10*3/uL (ref 150–400)
RBC: 4.35 MIL/uL (ref 4.22–5.81)
RDW: 15.3 % (ref 11.5–15.5)
WBC: 16.4 10*3/uL — ABNORMAL HIGH (ref 4.0–10.5)
nRBC: 0 % (ref 0.0–0.2)

## 2018-08-19 MED ORDER — IPRATROPIUM-ALBUTEROL 0.5-2.5 (3) MG/3ML IN SOLN
3.0000 mL | Freq: Four times a day (QID) | RESPIRATORY_TRACT | Status: DC
  Administered 2018-08-19: 3 mL via RESPIRATORY_TRACT
  Filled 2018-08-19: qty 3

## 2018-08-19 MED ORDER — DOXYCYCLINE HYCLATE 100 MG PO TABS: 100.0000 mg | ORAL_TABLET | Freq: Two times a day (BID) | ORAL | 0 refills | Status: DC

## 2018-08-19 MED ORDER — PREDNISONE 10 MG PO TABS: ORAL_TABLET | ORAL | 0 refills | Status: DC

## 2018-08-19 MED ORDER — DOXYCYCLINE HYCLATE 100 MG PO TABS: 100.0000 mg | ORAL_TABLET | Freq: Two times a day (BID) | ORAL | Status: DC

## 2018-08-19 NOTE — Discharge Summary (Signed)
Physician Discharge Summary  Gregory Christensen SWN:[id number] DOB: [birth year] DOA: 08/18/2018  PCP: Leighton Ruff, MD  Admit date: 08/18/2018 Discharge date: 08/19/2018  Admitted From: Home Disposition: Home Recommendations for Outpatient Follow-up:  1. Follow up with PCP in 1-2 weeks 2. Please obtain BMP/CBC in one week   Home Health: None Equipment/Devices patient has oxygen at home Discharge Condition stable and improved  CODE STATUS: DO NOT RESUSCITATE Diet recommendation cardiac diet  Brief/Interim Summary:68 y.o. male with history of COPD, pulmonary embolism, hypertension who was just recently admitted to the hospital and discharged on August 04, 2018 2 weeks ago states that he was doing fine until 2 days ago when he became short of breath with wheezing again.  Denies any chest pain has been exam productive cough denies any fever chills.  ED Course: In the ER patient was found to be wheezing despite multiple doses of nebulizer and also dose of steroids.  EKG was showing normal sinus rhythm chest x-ray was unremarkable.  Patient admitted for COPD exacerbation   Discharge Diagnoses:  Active Problems:   COPD exacerbation ([place])   Essential hypertension   Pulmonary embolism ([facility])   Acute hypoxemic respiratory failure ([facility])  #1 acute hypoxic respiratory failure secondary to COPD exacerbation.  Patient has had multiple recent hospital admissions for the same.  He continues to smoke.  Patient lives at home with his 27 year old mother.  He is anxious to go home today.  He  feels he is back to his baseline.  Will discharge patient on prednisone, doxycycline, Pulmicort and home nebulizer.  Patient was seen by physical therapy and did not feel the need to have home physical therapy.  #2 recent PE and DVT continue apixaban.  #3 hypertension on amlodipine  #4 normocytic normochromic anemia at baseline  5 history of BPH continue tamsulosin.  #6 tobacco abuse counseled with about  tobacco cessation.  Estimated body mass index is 21.41 kg/m as calculated from the following:   Height as of this encounter: 6\' 1"  (1.854 m).   Weight as of this encounter: 73.6 kg.  Discharge Instructions  Discharge Instructions    Call MD for:  difficulty breathing, headache or visual disturbances   Complete by:  As directed    Call MD for:  persistant nausea and vomiting   Complete by:  As directed    Call MD for:  severe uncontrolled pain   Complete by:  As directed    Call MD for:  temperature >100.4   Complete by:  As directed    Diet - low sodium heart healthy   Complete by:  As directed    Increase activity slowly   Complete by:  As directed      Allergies as of 08/19/2018   No Known Allergies     Medication List    STOP taking these medications   montelukast 10 MG tablet Commonly known as:  SINGULAIR     TAKE these medications   acetaminophen 500 MG tablet Commonly known as:  TYLENOL Take 1,000 mg by mouth 2 (two) times daily.   albuterol 108 (90 Base) MCG/ACT inhaler Commonly known as:  PROVENTIL HFA;VENTOLIN HFA Inhale 2 puffs into the lungs every 2 (two) hours as needed for wheezing or shortness of breath.   albuterol (2.5 MG/3ML) 0.083% nebulizer solution Commonly known as:  PROVENTIL Take 3 mLs (2.5 mg total) by nebulization every 6 (six) hours as needed for wheezing or shortness of breath.   amLODipine 2.5 MG tablet Commonly  known as:  NORVASC Take 2.5 mg by mouth daily.   apixaban 5 MG Tabs tablet Commonly known as:  ELIQUIS Take 1 tablet (5 mg total) by mouth 2 (two) times daily.   budesonide-formoterol 160-4.5 MCG/ACT inhaler Commonly known as:  SYMBICORT Inhale 1 puff into the lungs 2 (two) times daily.   diclofenac 75 MG EC tablet Commonly known as:  VOLTAREN Take 75 mg by mouth 2 (two) times daily as needed (shoulder pain).   doxycycline 100 MG tablet Commonly known as:  VIBRA-TABS Take 1 tablet (100 mg total) by mouth every 12  (twelve) hours.   fluticasone 50 MCG/ACT nasal spray Commonly known as:  FLONASE Place 1 spray into both nostrils 2 (two) times daily.   gabapentin 300 MG capsule Commonly known as:  NEURONTIN Take 300 mg by mouth 2 (two) times daily.   Melatonin 3 MG Tabs Take 3 mg by mouth at bedtime as needed (sleep).   pantoprazole 40 MG tablet Commonly known as:  PROTONIX Take 1 tablet (40 mg total) by mouth daily.   polyvinyl alcohol 1.4 % ophthalmic solution Commonly known as:  LIQUIFILM TEARS Place 1 drop into both eyes as needed for dry eyes.   predniSONE 10 MG tablet Commonly known as:  DELTASONE Take 30 mg daily for 3 days then 20 mg for the following 3 days and then 10 mg till done. What changed:    medication strength  how much to take  how to take this  when to take this  additional instructions   SPIRIVA RESPIMAT 1.25 MCG/ACT Aers Generic drug:  Tiotropium Bromide Monohydrate Inhale 2 puffs into the lungs daily.   tamsulosin 0.4 MG Caps capsule Commonly known as:  FLOMAX Take 0.4 mg by mouth daily after supper.      Follow-up Information    Leighton Ruff, MD Follow up.   Specialty:  Family Medicine Contact information: [place] [address] [phone]          No Known Allergies  Consultations: None  Procedures/Studies: Dg Chest 2 View  Result Date: 08/01/2018 CLINICAL DATA:  Shortness of breath, COPD, asthma. EXAM: CHEST - 2 VIEW COMPARISON:  05/31/2018 FINDINGS: Heart size and pulmonary vascularity are normal. Emphysematous changes and scattered fibrosis in the lungs. Linear nodular scarring or infiltrative changes in the right mid lung are progressing since previous study. Short time frame suggest acute process, possibly pneumonia or atelectasis. Scattered calcified granulomas. No blunting of costophrenic angles. No pneumothorax. Mediastinal contours appear intact. IMPRESSION: 1. Emphysematous changes and fibrosis in the  lungs. 2. Increasing linear nodular infiltration or atelectasis in the right mid lung. Electronically Signed   By: Lucienne Capers M.D.   On: 08/01/2018 21:16   Ct Angio Chest Pe W And/or Wo Contrast  Result Date: 08/02/2018 CLINICAL DATA:  COPD and asthma with dyspnea around 3 p.m. after smoking. EXAM: CT ANGIOGRAPHY CHEST WITH CONTRAST TECHNIQUE: Multidetector CT imaging of the chest was performed using the standard protocol during bolus administration of intravenous contrast. Multiplanar CT image reconstructions and MIPs were obtained to evaluate the vascular anatomy. CONTRAST:  157mL ISOVUE-370 IOPAMIDOL (ISOVUE-370) INJECTION 76% COMPARISON:  05/31/2018 chest CT FINDINGS: Cardiovascular: Conventional branch pattern of the great vessels. Resolution of previously noted filling defect extending into the right upper lobe lobar pulmonary arterial branch and segmental branch vessels. No large central pulmonary embolus is identified. Hypodensity extending into the right lower lobe pulmonary arteries are felt artifactual due to motion and left arm  streak artifacts as better visualized on the reformatted images. No obvious aortic dissection. No aortic aneurysm. Atherosclerosis of the aorta and great vessels. Three-vessel coronary arteriosclerosis. Normal heart size without pericardial effusion or thickening. Mediastinum/Nodes: No enlarged mediastinal, hilar, or axillary lymph nodes. Thyroid gland, trachea, and esophagus demonstrate no significant findings. Lungs/Pleura: Panlobular emphysema throughout both lungs. New foci of subsegmental atelectasis and/or scarring the inferior right upper lobe abutting Amerman fissure and extending to the pleura. Atelectasis is also noted medially within the right middle lobe. No effusion or pneumothorax. Upper Abdomen: Dilatation and ectasia of the left included renal collecting system consistent previously described mild left-sided hydronephrosis. Musculoskeletal: No acute nor  aggressive osseous lesions of the bony thorax. Lower cervical spondylosis. Review of the MIP images confirms the above findings. IMPRESSION: 1. Resolution of previously noted right upper lobe lobar pulmonary embolus. No large central pulmonary embolus is identified. 2. Panlobular emphysema with new areas of subsegmental atelectasis and/or scarring in the right upper lobe and right middle lobe. 3. Respiratory motion and left arm streak artifact simulating pulmonary emboli to the right lower lobe are identified. No acute pulmonary embolus is currently noted. Aortic Atherosclerosis (ICD10-I70.0) and Emphysema (ICD10-J43.9). Electronically Signed   By: Ashley Royalty M.D.   On: 08/02/2018 00:39   Dg Chest Portable 1 View  Result Date: 08/18/2018 CLINICAL DATA:  Shortness of breath and COPD EXAM: PORTABLE CHEST 1 VIEW COMPARISON:  08/01/2018 FINDINGS: Normal heart size and mediastinal contours. No acute infiltrate or edema. Mild scarring in the right mid lung which was evaluated by CT earlier this month. No effusion or pneumothorax. No acute osseous findings. IMPRESSION: Emphysema without acute superimposed finding. Electronically Signed   By: Monte Fantasia M.D.   On: 08/18/2018 04:21    (Echo, Carotid, EGD, Colonoscopy, ERCP)    Subjective: Patient resting in bed awake alert talking in full sentences on 1 L of oxygen at this time patient normally uses 2 L of oxygen at home.  He continues to smoke.  Counseled with him about cessation of smoking.  Discharge Exam: Vitals:   08/19/18 0520 08/19/18 0741  BP: (!) 152/95   Pulse: 86   Resp: 20   Temp: 98 F (36.7 C)   SpO2: 99% 96%   Vitals:   08/18/18 1934 08/18/18 2108 08/19/18 0520 08/19/18 0741  BP:  (!) 143/80 (!) 152/95   Pulse:  96 86   Resp:  20 20   Temp:  98.7 F (37.1 C) 98 F (36.7 C)   TempSrc:  Oral Oral   SpO2: 98% 100% 99% 96%  Weight:      Height:        General: Pt is alert, awake, not in acute distress Cardiovascular:  RRR, S1/S2 +, no rubs, no gallops Respiratory: CTA bilaterally, no wheezing, no rhonchi Abdominal: Soft, NT, ND, bowel sounds + Extremities: no edema, no cyanosis    The results of significant diagnostics from this hospitalization (including imaging, microbiology, ancillary and laboratory) are listed below for reference.     Microbiology: No results found for this or any previous visit (from the past 240 hour(s)).   Labs: BNP (last 3 results) No results for input(s): BNP in the last 8760 hours. Basic Metabolic Panel: Recent Labs  Lab 08/18/18 0409 08/18/18 0705 08/19/18 0528  NA 138  --  134*  K 4.1  --  4.7  CL 107  --  104  CO2 22  --  20*  GLUCOSE 129*  --  155*  BUN 13  --  18  CREATININE 1.37*  --  1.47*  CALCIUM 8.9  --  9.3  MG  --  2.2  --    Liver Function Tests: No results for input(s): AST, ALT, ALKPHOS, BILITOT, PROT, ALBUMIN in the last 168 hours. No results for input(s): LIPASE, AMYLASE in the last 168 hours. No results for input(s): AMMONIA in the last 168 hours. CBC: Recent Labs  Lab 08/18/18 0409 08/19/18 0528  WBC 8.5 16.4*  NEUTROABS 6.1  --   HGB 12.0* 11.8*  HCT 39.9 39.0  MCV 89.9 89.7  PLT 164 191   Cardiac Enzymes: Recent Labs  Lab 08/18/18 0705  TROPONINI <0.03   BNP: Invalid input(s): POCBNP CBG: No results for input(s): GLUCAP in the last 168 hours. D-Dimer No results for input(s): DDIMER in the last 72 hours. Hgb A1c No results for input(s): HGBA1C in the last 72 hours. Lipid Profile No results for input(s): CHOL, HDL, LDLCALC, TRIG, CHOLHDL, LDLDIRECT in the last 72 hours. Thyroid function studies No results for input(s): TSH, T4TOTAL, T3FREE, THYROIDAB in the last 72 hours.  Invalid input(s): FREET3 Anemia work up No results for input(s): VITAMINB12, FOLATE, FERRITIN, TIBC, IRON, RETICCTPCT in the last 72 hours. Urinalysis    Component Value Date/Time   COLORURINE YELLOW 10/20/2017 0824   APPEARANCEUR CLEAR  10/20/2017 0824   LABSPEC 1.011 10/20/2017 0824   PHURINE 6.0 10/20/2017 0824   GLUCOSEU NEGATIVE 10/20/2017 0824   HGBUR SMALL (A) 10/20/2017 0824   BILIRUBINUR NEGATIVE 10/20/2017 0824   KETONESUR NEGATIVE 10/20/2017 0824   PROTEINUR NEGATIVE 10/20/2017 0824   NITRITE NEGATIVE 10/20/2017 0824   LEUKOCYTESUR LARGE (A) 10/20/2017 0824   Sepsis Labs Invalid input(s): PROCALCITONIN,  WBC,  LACTICIDVEN Microbiology No results found for this or any previous visit (from the past 240 hour(s)).  34 min  SIGNED:   Georgette Shell, MD  [facility] 08/19/2018, 10:43 AM Pager   If 7PM-7AM, please contact night-coverage [email or web address] Password [email or web address]

## 2018-08-19 NOTE — Care Management Obs Status (Signed)
[facility] NOTIFICATION   Patient Details  Name: Gregory Christensen MRN: [id number] Date of Birth: [birth year]   Medicare Observation Status Notification Given:  Yes    Dessa Phi, RN 08/19/2018, 10:57 AM

## 2018-08-19 NOTE — Care Management Note (Signed)
Case Management Note  Patient Details  Name: Gregory Christensen MRN: [id number] Date of Birth: 1950/07/09  Subjective/Objective: COPD. From home. Has home 02-[facility] has travel tank-patient had concerns about his home 48 tanks-[facility] rep Santiago Glad has spoken to patient to resolve the issue. No further CM needs.                   Action/Plan:dc home.   Expected Discharge Date:  08/19/18               Expected Discharge Plan:  Home/Self Care  In-House Referral:     Discharge planning Services  CM Consult  Post Acute Care Choice:  Durable Medical Equipment(Active w/[facility] for home 02-has travel tank) Choice offered to:     DME Arranged:    DME Agency:     HH Arranged:    HH Agency:     Status of Service:  Completed, signed off  If discussed at [employer], dates discussed:    Additional Comments:  Dessa Phi, RN 08/19/2018, 11:19 AM

## 2018-08-19 NOTE — Evaluation (Signed)
Physical Therapy Evaluation Patient Details Name: Gregory Christensen MRN: [id number] DOB: January 22, 1951 Today's Date: 08/19/2018   History of Present Illness  Patient is a 68 y/o male presenting to the ED on 08/18/2018 with primary complaints of SOB. Of note, recent hospital admission for SOB and wheezing. PMH significant of COPD, pulmonary embolism, hypertension. Admitted for COPD, pulmonary embolism, hypertension.    Clinical Impression  Patient admitted with the above listed diagnosis. Patient reports Mod I with SPC prior to admission. Patient today ambulating in room and hallway without AD and on RA - no LOB or evidence of instability with mobility. SpO2 with mobility remaining at/above 92-94% on RA. Patient feels as if he is at baseline level of functioning. No further acute PT needs identified. PT to sign off.      Follow Up Recommendations No PT follow up    Equipment Recommendations  None recommended by PT    Recommendations for Other Services       Precautions / Restrictions Precautions Precautions: Fall Restrictions Weight Bearing Restrictions: No      Mobility  Bed Mobility Overal bed mobility: Modified Independent                Transfers Overall transfer level: Needs assistance Equipment used: None Transfers: Sit to/from Stand Sit to Stand: Supervision         General transfer comment: for safety  Ambulation/Gait Ambulation/Gait assistance: Supervision Gait Distance (Feet): 250 Feet Assistive device: None Gait Pattern/deviations: Step-through pattern;Decreased stride length Gait velocity: WNL   General Gait Details: steady pace of gait without LOB; Ambulating on RA with SpO2 >/= 92-94%  Stairs            Wheelchair Mobility    Modified Rankin (Stroke Patients Only)       Balance Overall balance assessment: Mild deficits observed, not formally tested                                           Pertinent Vitals/Pain Pain  Assessment: No/denies pain    Home Living Family/patient expects to be discharged to:: Private residence Living Arrangements: Parent Available Help at Discharge: Family;Available PRN/intermittently Type of Home: House Home Access: Stairs to enter Entrance Stairs-Rails: None Entrance Stairs-Number of Steps: 1 Home Layout: One level Home Equipment: Cane - single point      Prior Function Level of Independence: Independent with assistive device(s)         Comments: uses cane sometimes. can walk without it.     Hand Dominance        Extremity/Trunk Assessment   Upper Extremity Assessment Upper Extremity Assessment: Overall WFL for tasks assessed    Lower Extremity Assessment Lower Extremity Assessment: Overall WFL for tasks assessed    Cervical / Trunk Assessment Cervical / Trunk Assessment: Normal  Communication   Communication: No difficulties  Cognition Arousal/Alertness: Awake/alert Behavior During Therapy: WFL for tasks assessed/performed Overall Cognitive Status: Within Functional Limits for tasks assessed                                        General Comments General comments (skin integrity, edema, etc.): very motivated to return home    Exercises     Assessment/Plan    PT Assessment Patent does not need any further PT services  PT Problem List         PT Treatment Interventions      PT Goals (Current goals can be found in the Care Plan section)  Acute Rehab PT Goals PT Goal Formulation: All assessment and education complete, DC therapy    Frequency     Barriers to discharge        Co-evaluation               AM-PAC PT "6 Clicks" Mobility  Outcome Measure Help needed turning from your back to your side while in a flat bed without using bedrails?: None Help needed moving from lying on your back to sitting on the side of a flat bed without using bedrails?: None Help needed moving to and from a bed to a chair  (including a wheelchair)?: None Help needed standing up from a chair using your arms (e.g., wheelchair or bedside chair)?: A Little Help needed to walk in hospital room?: A Little Help needed climbing 3-5 steps with a railing? : A Little 6 Click Score: 21    End of Session Equipment Utilized During Treatment: Gait belt Activity Tolerance: Patient tolerated treatment well Patient left: in bed;with call bell/phone within reach Nurse Communication: Mobility status PT Visit Diagnosis: Unsteadiness on feet (R26.81)    Time: 3159-4585 PT Time Calculation (min) (ACUTE ONLY): 14 min   Charges:   PT Evaluation $PT Eval Moderate Complexity: 1 Mod          Lanney Gins, PT, DPT Supplemental Physical Therapist 08/19/18 9:19 AM Pager: [phone] Office: [phone]

## 2018-08-21 DIAGNOSIS — J449 Chronic obstructive pulmonary disease, unspecified: Secondary | ICD-10-CM | POA: Diagnosis not present

## 2018-08-24 ENCOUNTER — Encounter (HOSPITAL_COMMUNITY): Payer: Self-pay | Admitting: Emergency Medicine

## 2018-08-24 ENCOUNTER — Emergency Department (HOSPITAL_COMMUNITY)
Admission: EM | Admit: 2018-08-24 | Discharge: 2018-08-24 | Disposition: A | Discharge status: Home / Self Care | Payer: Medicare HMO | Attending: Emergency Medicine | Admitting: Emergency Medicine

## 2018-08-24 ENCOUNTER — Other Ambulatory Visit: Payer: Self-pay

## 2018-08-24 DIAGNOSIS — N3091 Cystitis, unspecified with hematuria: Secondary | ICD-10-CM

## 2018-08-24 DIAGNOSIS — Z79899 Other long term (current) drug therapy: Secondary | ICD-10-CM | POA: Insufficient documentation

## 2018-08-24 DIAGNOSIS — N3090 Cystitis, unspecified without hematuria: Secondary | ICD-10-CM | POA: Diagnosis not present

## 2018-08-24 DIAGNOSIS — I10 Essential (primary) hypertension: Secondary | ICD-10-CM | POA: Diagnosis not present

## 2018-08-24 DIAGNOSIS — Z7901 Long term (current) use of anticoagulants: Secondary | ICD-10-CM | POA: Insufficient documentation

## 2018-08-24 DIAGNOSIS — J449 Chronic obstructive pulmonary disease, unspecified: Secondary | ICD-10-CM | POA: Diagnosis not present

## 2018-08-24 DIAGNOSIS — R319 Hematuria, unspecified: Secondary | ICD-10-CM | POA: Diagnosis present

## 2018-08-24 DIAGNOSIS — Z87891 Personal history of nicotine dependence: Secondary | ICD-10-CM | POA: Diagnosis not present

## 2018-08-24 LAB — CBC WITH DIFFERENTIAL/PLATELET
Abs Immature Granulocytes: 0.12 10*3/uL — ABNORMAL HIGH (ref 0.00–0.07)
Basophils Absolute: 0 10*3/uL (ref 0.0–0.1)
Basophils Relative: 0 %
EOS ABS: 0 10*3/uL (ref 0.0–0.5)
Eosinophils Relative: 0 %
HCT: 39.4 % (ref 39.0–52.0)
Hemoglobin: 12 g/dL — ABNORMAL LOW (ref 13.0–17.0)
Immature Granulocytes: 2 %
Lymphocytes Relative: 15 %
Lymphs Abs: 1.2 10*3/uL (ref 0.7–4.0)
MCH: 27.8 pg (ref 26.0–34.0)
MCHC: 30.5 g/dL (ref 30.0–36.0)
MCV: 91.4 fL (ref 80.0–100.0)
Monocytes Absolute: 0.3 10*3/uL (ref 0.1–1.0)
Monocytes Relative: 4 %
NEUTROS PCT: 79 %
Neutro Abs: 6.2 10*3/uL (ref 1.7–7.7)
Platelets: 202 10*3/uL (ref 150–400)
RBC: 4.31 MIL/uL (ref 4.22–5.81)
RDW: 15.2 % (ref 11.5–15.5)
WBC: 7.9 10*3/uL (ref 4.0–10.5)
nRBC: 0 % (ref 0.0–0.2)

## 2018-08-24 LAB — URINALYSIS, ROUTINE W REFLEX MICROSCOPIC

## 2018-08-24 LAB — BASIC METABOLIC PANEL
Anion gap: 5 (ref 5–15)
BUN: 19 mg/dL (ref 8–23)
CALCIUM: 8.7 mg/dL — AB (ref 8.9–10.3)
CO2: 23 mmol/L (ref 22–32)
Chloride: 111 mmol/L (ref 98–111)
Creatinine, Ser: 1.41 mg/dL — ABNORMAL HIGH (ref 0.61–1.24)
GFR calc Af Amer: 59 mL/min — ABNORMAL LOW (ref >60)
GFR calc non Af Amer: 51 mL/min — ABNORMAL LOW (ref >60)
Glucose, Bld: 124 mg/dL — ABNORMAL HIGH (ref 70–99)
Potassium: 4.5 mmol/L (ref 3.5–5.1)
Sodium: 139 mmol/L (ref 135–145)

## 2018-08-24 LAB — URINALYSIS, MICROSCOPIC (REFLEX): RBC / HPF: >50 RBC/hpf (ref 0–5)

## 2018-08-24 MED ORDER — CEPHALEXIN 500 MG PO CAPS: 500.0000 mg | ORAL_CAPSULE | Freq: Two times a day (BID) | ORAL | 0 refills | Status: AC

## 2018-08-24 MED ORDER — CEPHALEXIN 250 MG PO CAPS
250.0000 mg | ORAL_CAPSULE | Freq: Once | ORAL | Status: AC
  Administered 2018-08-24: 250 mg via ORAL
  Filled 2018-08-24: qty 1

## 2018-08-24 NOTE — ED Triage Notes (Signed)
Pt reports around 11am today 'peed out a blood clot then had dark blood in my urine". Denies pain at this time. Pt takes Eliquis.

## 2018-08-24 NOTE — Discharge Instructions (Addendum)
Continue your eloquis. Thank you for allowing me to care for you today. Please return to the emergency department if you have new or worsening symptoms. Take your medications as instructed.

## 2018-08-24 NOTE — ED Provider Notes (Signed)
[facility] DEPT Provider Note   CSN: [id number] Arrival date & time: 08/24/18  1238    History   Chief Complaint Chief Complaint  Patient presents with  . Hematuria    HPI Gregory Christensen is a 68 y.o. male.     68 y/o AAM with PMH COPD, recent PE on eloquis, DVT, HTN, continues to smoke who presents to the ED for hematuria. Patient reports today around 1030 am he went to urinate and saw a large blood clot. Reports that since then his urine has been bright red. Denies any pain with urination, abdominal pain, fever, n/v/d, flank pain. Denies bleeding elsewhere or easy bruising.      Past Medical History:  Diagnosis Date  . Arthritis   . Chronic back pain   . Chronic hip pain   . COPD (chronic obstructive pulmonary disease) ([place])   . Emphysema lung [facility])     Patient Active Problem List   Diagnosis Date Noted  . Respiratory failure, acute-on-chronic ([place]) 08/03/2018  . Acute deep vein thrombosis (DVT) of left lower extremity ([place])   . Acute hypoxemic respiratory failure ([place])   . Pulmonary embolism ([place]) 05/31/2018  . COPD exacerbation ([place]) 10/18/2017  . Hypoxia   . Respiratory distress   . Essential hypertension     History reviewed. No pertinent surgical history.      Home Medications    Prior to Admission medications   Medication Sig Start Date End Date Taking? Authorizing Provider  acetaminophen (TYLENOL) 500 MG tablet Take 1,000 mg by mouth 2 (two) times daily.     [provider]  albuterol (PROVENTIL HFA;VENTOLIN HFA) 108 (90 Base) MCG/ACT inhaler Inhale 2 puffs into the lungs every 2 (two) hours as needed for wheezing or shortness of breath. 08/04/18   Reyne Dumas, MD  albuterol (PROVENTIL) (2.5 MG/3ML) 0.083% nebulizer solution Take 3 mLs (2.5 mg total) by nebulization every 6 (six) hours as needed for wheezing or shortness of breath. 08/04/18   Reyne Dumas, MD  amLODipine (NORVASC) 2.5 MG tablet Take 2.5 mg by  mouth daily.    [provider]  apixaban (ELIQUIS) 5 MG TABS tablet Take 1 tablet (5 mg total) by mouth 2 (two) times daily. 06/10/18   Regalado, Belkys A, MD  budesonide-formoterol (SYMBICORT) 160-4.5 MCG/ACT inhaler Inhale 1 puff into the lungs 2 (two) times daily. 08/04/18   Reyne Dumas, MD  cephALEXin (KEFLEX) 500 MG capsule Take 1 capsule (500 mg total) by mouth 2 (two) times daily for 5 days. 08/24/18 08/29/18  Alveria Apley, PA-C  diclofenac (VOLTAREN) 75 MG EC tablet Take 75 mg by mouth 2 (two) times daily as needed (shoulder pain).    [provider]  doxycycline (VIBRA-TABS) 100 MG tablet Take 1 tablet (100 mg total) by mouth every 12 (twelve) hours. 08/19/18   Georgette Shell, MD  fluticasone [facility]) 50 MCG/ACT nasal spray Place 1 spray into both nostrils 2 (two) times daily. 08/04/18   Reyne Dumas, MD  gabapentin (NEURONTIN) 300 MG capsule Take 300 mg by mouth 2 (two) times daily.     [provider]  Melatonin 3 MG TABS Take 3 mg by mouth at bedtime as needed (sleep).     [provider]  pantoprazole (PROTONIX) 40 MG tablet Take 1 tablet (40 mg total) by mouth daily. 08/05/18   Reyne Dumas, MD  polyvinyl alcohol (LIQUIFILM TEARS) 1.4 % ophthalmic solution Place 1 drop into both eyes as needed for dry  eyes.    [provider]  predniSONE (DELTASONE) 10 MG tablet Take 30 mg daily for 3 days then 20 mg for the following 3 days and then 10 mg till done. 08/19/18   Georgette Shell, MD  tamsulosin (FLOMAX) 0.4 MG CAPS capsule Take 0.4 mg by mouth daily after supper.    [provider]  Tiotropium Bromide Monohydrate (SPIRIVA RESPIMAT) 1.25 MCG/ACT AERS Inhale 2 puffs into the lungs daily.    [provider]    Family History No family history on file.  Social History Social History   Tobacco Use  . Smoking status: Former Smoker    Packs/day: 1.00    Years: 26.00    Pack years: 26.00    Types: Cigarettes     Last attempt to quit: 10/18/2017    Years since quitting: 0.8  . Smokeless tobacco: Never Used  Substance Use Topics  . Alcohol use: Yes  . Drug use: Yes    Types: Flunitrazepam     Allergies   Patient has no known allergies.   Review of Systems Review of Systems  Constitutional: Negative.   HENT: Negative for nosebleeds.   Respiratory: Negative for cough and shortness of breath.   Gastrointestinal: Negative for nausea and vomiting.  Genitourinary: Positive for hematuria. Negative for decreased urine volume, difficulty urinating, discharge, dysuria, flank pain, frequency, genital sores, penile swelling, testicular pain and urgency.  Musculoskeletal: Negative for arthralgias and back pain.  Neurological: Negative for dizziness, light-headedness and headaches.  Hematological: Bruises/bleeds easily.     Physical Exam Updated Vital Signs BP (!) 153/100   Pulse 62   Temp 98.1 F (36.7 C) (Oral)   Resp 18   SpO2 100%   Physical Exam Vitals signs and nursing note reviewed.  Constitutional:      Appearance: Normal appearance.  HENT:     Head: Normocephalic and atraumatic.     Nose: Nose normal.     Mouth/Throat:     Mouth: Mucous membranes are moist.  Eyes:     Conjunctiva/sclera: Conjunctivae normal.     Pupils: Pupils are equal, round, and reactive to light.  Cardiovascular:     Rate and Rhythm: Normal rate and regular rhythm.  Pulmonary:     Effort: Pulmonary effort is normal.     Breath sounds: Normal breath sounds. No wheezing, rhonchi or rales.  Abdominal:     General: Abdomen is flat. Bowel sounds are normal. There is no distension.     Tenderness: There is no abdominal tenderness. There is no right CVA tenderness or left CVA tenderness.  Musculoskeletal:     Right lower leg: No edema.     Left lower leg: No edema.  Skin:    General: Skin is warm.     Capillary Refill: Capillary refill takes less than 2 seconds.  Neurological:     General: No focal deficit  present.     Mental Status: He is alert.  Psychiatric:        Mood and Affect: Mood normal.      ED Treatments / Results  Labs (all labs ordered are listed, but only abnormal results are displayed) Labs Reviewed  URINALYSIS, ROUTINE W REFLEX MICROSCOPIC - Abnormal; Notable for the following components:      Result Value   Color, Urine RED (*)    APPearance TURBID (*)    Glucose, UA   (*)    Value: TEST NOT REPORTED DUE TO COLOR INTERFERENCE OF URINE PIGMENT  Hgb urine dipstick   (*)    Value: TEST NOT REPORTED DUE TO COLOR INTERFERENCE OF URINE PIGMENT   Bilirubin Urine   (*)    Value: TEST NOT REPORTED DUE TO COLOR INTERFERENCE OF URINE PIGMENT   Ketones, ur   (*)    Value: TEST NOT REPORTED DUE TO COLOR INTERFERENCE OF URINE PIGMENT   Protein, ur   (*)    Value: TEST NOT REPORTED DUE TO COLOR INTERFERENCE OF URINE PIGMENT   Nitrite   (*)    Value: TEST NOT REPORTED DUE TO COLOR INTERFERENCE OF URINE PIGMENT   Leukocytes,Ua   (*)    Value: TEST NOT REPORTED DUE TO COLOR INTERFERENCE OF URINE PIGMENT   All other components within normal limits  URINALYSIS, MICROSCOPIC (REFLEX) - Abnormal; Notable for the following components:   Bacteria, UA RARE (*)    All other components within normal limits  CBC WITH DIFFERENTIAL/PLATELET - Abnormal; Notable for the following components:   Hemoglobin 12.0 (*)    Abs Immature Granulocytes 0.12 (*)    All other components within normal limits  BASIC METABOLIC PANEL - Abnormal; Notable for the following components:   Glucose, Bld 124 (*)    Creatinine, Ser 1.41 (*)    Calcium 8.7 (*)    GFR calc non Af Amer 51 (*)    GFR calc Af Amer 59 (*)    All other components within normal limits  URINE CULTURE    EKG None  Radiology No results found.  Procedures Procedures (including critical care time)  Medications Ordered in ED Medications  cephALEXin (KEFLEX) capsule 250 mg (has no administration in time range)     Initial  Impression / Assessment and Plan / ED Course  I have reviewed the triage vital signs and the nursing notes.  Pertinent labs & imaging results that were available during my care of the patient were reviewed by me and considered in my medical decision making (see chart for details).  Clinical Course as of Aug 24 1650  Sun Feb 23, 185  [id number] 68 year old male with COPD and recent pulmonary embolism on Eliquis here for hematuria.  No other symptoms.  His hemoglobin is stable at 7.  Urinalysis is hard to read given the blood in the urine but he does have few bacteria and white blood cells.  His basic metabolic panel is stable for him.  We will treat for hemorrhagic cystitis with Keflex and culture the urine.  Remain on Eliquis at this time and follow-up with urologist and primary care.   [KM]    Clinical Course User Index [KM] Alveria Apley, PA-C       Based on review of vitals, medical screening exam, lab work and/or imaging, there does not appear to be an acute, emergent etiology for the patient's symptoms. Counseled pt on good return precautions and encouraged both PCP and ED follow-up as needed.   Clinical Impression: 1. Hemorrhagic cystitis     Disposition: Discharge    This note was prepared with assistance of Dragon voice recognition software. Occasional wrong-word or sound-a-like substitutions may have occurred due to the inherent limitations of voice recognition software.   Final Clinical Impressions(s) / ED Diagnoses   Final diagnoses:  Hemorrhagic cystitis    ED Discharge Orders         Ordered    cephALEXin (KEFLEX) 500 MG capsule  2 times daily     08/24/18 1640  Alveria Apley, PA-C 08/24/18 1652    Tegeler, Gwenyth Allegra, MD 08/25/18 [phone]

## 2018-08-26 LAB — URINE CULTURE: Culture: NO GROWTH

## 2018-08-28 DIAGNOSIS — I10 Essential (primary) hypertension: Secondary | ICD-10-CM | POA: Diagnosis not present

## 2018-08-28 DIAGNOSIS — N400 Enlarged prostate without lower urinary tract symptoms: Secondary | ICD-10-CM

## 2018-08-28 DIAGNOSIS — J441 Chronic obstructive pulmonary disease with (acute) exacerbation: Secondary | ICD-10-CM | POA: Diagnosis not present

## 2018-08-28 DIAGNOSIS — I2510 Atherosclerotic heart disease of native coronary artery without angina pectoris: Secondary | ICD-10-CM | POA: Diagnosis not present

## 2018-08-28 DIAGNOSIS — M1990 Unspecified osteoarthritis, unspecified site: Secondary | ICD-10-CM | POA: Diagnosis not present

## 2018-08-28 DIAGNOSIS — J449 Chronic obstructive pulmonary disease, unspecified: Secondary | ICD-10-CM | POA: Diagnosis not present

## 2018-08-28 HISTORY — DX: Benign prostatic hyperplasia without lower urinary tract symptoms: N40.0

## 2018-09-01 DIAGNOSIS — R319 Hematuria, unspecified: Secondary | ICD-10-CM | POA: Diagnosis not present

## 2018-09-01 DIAGNOSIS — J449 Chronic obstructive pulmonary disease, unspecified: Secondary | ICD-10-CM | POA: Diagnosis not present

## 2018-09-03 DIAGNOSIS — J441 Chronic obstructive pulmonary disease with (acute) exacerbation: Secondary | ICD-10-CM | POA: Diagnosis not present

## 2018-09-08 ENCOUNTER — Telehealth (HOSPITAL_COMMUNITY): Payer: Self-pay

## 2018-09-08 DIAGNOSIS — R310 Gross hematuria: Secondary | ICD-10-CM

## 2018-09-08 DIAGNOSIS — N401 Enlarged prostate with lower urinary tract symptoms: Secondary | ICD-10-CM | POA: Diagnosis not present

## 2018-09-08 DIAGNOSIS — R351 Nocturia: Secondary | ICD-10-CM | POA: Diagnosis not present

## 2018-09-08 HISTORY — DX: Gross hematuria: R31.0

## 2018-09-08 NOTE — Telephone Encounter (Signed)
Rescheduled pt for pulmonary rehab orientation for 10/10/2018 @ 9:30am and exc @ 10:30am on tues and thurs. Gave pt chart to nurse navigator to call pt insurance. Tedra Senegal. Support Rep II

## 2018-09-12 DIAGNOSIS — N1339 Other hydronephrosis: Secondary | ICD-10-CM | POA: Diagnosis not present

## 2018-09-12 DIAGNOSIS — R310 Gross hematuria: Secondary | ICD-10-CM | POA: Diagnosis not present

## 2018-09-19 DIAGNOSIS — R8271 Bacteriuria: Secondary | ICD-10-CM | POA: Diagnosis not present

## 2018-09-19 DIAGNOSIS — Z86711 Personal history of pulmonary embolism: Secondary | ICD-10-CM | POA: Diagnosis not present

## 2018-09-19 DIAGNOSIS — R310 Gross hematuria: Secondary | ICD-10-CM | POA: Diagnosis not present

## 2018-09-19 DIAGNOSIS — C652 Malignant neoplasm of left renal pelvis: Secondary | ICD-10-CM | POA: Diagnosis not present

## 2018-09-25 ENCOUNTER — Other Ambulatory Visit: Payer: Self-pay | Admitting: Urology

## 2018-09-26 DIAGNOSIS — R0600 Dyspnea, unspecified: Secondary | ICD-10-CM | POA: Diagnosis not present

## 2018-09-26 DIAGNOSIS — C652 Malignant neoplasm of left renal pelvis: Secondary | ICD-10-CM | POA: Diagnosis not present

## 2018-10-01 ENCOUNTER — Telehealth (HOSPITAL_COMMUNITY): Payer: Self-pay | Admitting: *Deleted

## 2018-10-01 NOTE — Telephone Encounter (Signed)
Returned pt call from message left on 09/29/18. Pt called regarding getting set up for pulmonary rehab here at [facility].  Called and spoke to pt. Per pt,  He talked to the [place] community care regarding a "new" authorization since his earlier one this year had expired.  He told them that our department was closed due to Covid-19.  The [facility] staff indicated that they would hold on issuing an authorization until we reopened to patients so the dates would be up to date. Pt verbalized understanding. Cherre Huger, BSN Cardiac and Training and development officer

## 2018-10-04 DIAGNOSIS — J441 Chronic obstructive pulmonary disease with (acute) exacerbation: Secondary | ICD-10-CM | POA: Diagnosis not present

## 2018-10-07 ENCOUNTER — Telehealth (HOSPITAL_COMMUNITY): Payer: Self-pay | Admitting: *Deleted

## 2018-10-07 ENCOUNTER — Telehealth: Payer: Self-pay | Admitting: Cardiovascular Disease

## 2018-10-07 NOTE — Telephone Encounter (Signed)
Informed scheduling to only schedule in patient appointments with DOD.

## 2018-10-07 NOTE — Telephone Encounter (Signed)
° ° °  Urgent ref for pre op clearance has been added to Dr Kyla Balzarine schedule for 4/13. Please advise if changes need to be made

## 2018-10-07 NOTE — Telephone Encounter (Signed)
Received [facility] authorization from [place] - [id number] for pt to participate in Pulmonary Rehab from Dr. Gwenette Greet for emphysema.  Called and left pt message that a new authorization had been received.  Also advised pt that pulmonary rehab is closed to patients due to national recommendation for Covid-19.  Once we are able to schedule, pt will be contacted.  Cherre Huger, BSN Cardiac and Training and development officer

## 2018-10-10 ENCOUNTER — Ambulatory Visit (HOSPITAL_COMMUNITY): Payer: No Typology Code available for payment source

## 2018-10-10 ENCOUNTER — Telehealth: Payer: Self-pay | Admitting: Cardiovascular Disease

## 2018-10-10 NOTE — Telephone Encounter (Signed)
Spoke with patient who verified all demographics.  He does not have a computer or smart phone. Prefers phone visit. Patient does not have e-mail.

## 2018-10-13 ENCOUNTER — Ambulatory Visit: Payer: No Typology Code available for payment source | Admitting: Cardiovascular Disease

## 2018-10-13 ENCOUNTER — Encounter: Payer: Self-pay | Admitting: Cardiovascular Disease

## 2018-10-15 ENCOUNTER — Other Ambulatory Visit: Payer: Self-pay

## 2018-10-15 ENCOUNTER — Telehealth (INDEPENDENT_AMBULATORY_CARE_PROVIDER_SITE_OTHER): Payer: Medicare HMO | Admitting: Cardiovascular Disease

## 2018-10-15 ENCOUNTER — Encounter: Payer: Self-pay | Admitting: Cardiovascular Disease

## 2018-10-15 VITALS — BP 124/58 | HR 105 | Ht 73.0 in

## 2018-10-15 DIAGNOSIS — I2510 Atherosclerotic heart disease of native coronary artery without angina pectoris: Secondary | ICD-10-CM | POA: Insufficient documentation

## 2018-10-15 DIAGNOSIS — Z7189 Other specified counseling: Secondary | ICD-10-CM

## 2018-10-15 DIAGNOSIS — I2699 Other pulmonary embolism without acute cor pulmonale: Secondary | ICD-10-CM | POA: Diagnosis not present

## 2018-10-15 DIAGNOSIS — I2584 Coronary atherosclerosis due to calcified coronary lesion: Secondary | ICD-10-CM | POA: Diagnosis not present

## 2018-10-15 DIAGNOSIS — I10 Essential (primary) hypertension: Secondary | ICD-10-CM

## 2018-10-15 DIAGNOSIS — R0789 Other chest pain: Secondary | ICD-10-CM | POA: Diagnosis not present

## 2018-10-15 NOTE — Patient Instructions (Addendum)
Medication Instructions:  Your physician recommends that you continue on your current medications as directed. Please refer to the Current Medication list given to you today.  If you need a refill on your cardiac medications before your next appointment, please call your pharmacy.    Lab work: None Ordered    Testing/Procedures: Your physician has requested that you have a lexiscan myoview. For further information please visit [email or web address]. Please follow instruction sheet, as given.    Follow-Up: At [facility], you and your health needs are our priority.  As part of our continuing mission to provide you with exceptional heart care, we have created designated Provider Care Teams.  These Care Teams include your primary Cardiologist (physician) and Advanced Practice Providers (APPs -  Physician Assistants and Nurse Practitioners) who all work together to provide you with the care you need, when you need it. You will need a follow up appointment in:  3 months depending on the results of your myoview. You may see Mertie Moores, MD or one of the following Advanced Practice Providers on your designated Care Team: Richardson Dopp, PA-C [place], [place] . Daune Perch, NP

## 2018-10-15 NOTE — Progress Notes (Signed)
Virtual Visit via Telephone Note   This visit type was conducted due to national recommendations for restrictions regarding the COVID-19 Pandemic (e.g. social distancing) in an effort to limit this patient's exposure and mitigate transmission in our community.  Due to his co-morbid illnesses, this patient is at least at moderate risk for complications without adequate follow up.  This format is felt to be most appropriate for this patient at this time.  The patient did not have access to video technology/had technical difficulties with video requiring transitioning to audio format only (telephone).  All issues noted in this document were discussed and addressed.  No physical exam could be performed with this format.  Please refer to the patient's chart for his  consent to telehealth for [facility]   Evaluation Performed:  Follow-up visit  Date:  10/15/2018   ID:  Gregory Christensen, Gregory Christensen [birth year], MRN [id number]  Patient Location: Home Provider Location: Home  PCP:  Leighton Ruff, MD  Cardiologist:  Mertie Moores, MD  Electrophysiologist:  None   Chief Complaint:  Pre op clearence for renal mass. Has hx of chest pain   History of Present Illness:    Gregory Christensen is a 68 y.o. male with a history of chronic obstructive pulmonary disease, pulmonary embolism, hypertension.  He has had several hospitalizations over the past 6 months.  On May 31, 2018 he was admitted to the hospital after waking up in the middle of the night with a very sharp pain on the right side of his chest.  The pain was pleuritic.  He went to the emergency room.  CT angiogram was negative for pulmonary embolus but a d-dimer was 2.8.  The patient was started on heparin and was transitioned to Eliquis.  He was discharged to home on June 04, 2018.   Echocardiogram performed on June 01, 2018 reveals normal left ventricular systolic function.  He has an ejection fraction of 55 to 60%.  He has grade 1 diastolic  dysfunction.  He had a CT angio which revealed 3 V coronary art calcification  He has severe DOE walking 10-15 feet. Like someone sitting on his chest  Pressure like sensation Lasts for few minutes.  Associated with diaphoresis No syncope or presyncope  Has been present for the past 4-5 years   No recent lipid leves + family history of CAD Father , mother - HTN DM  Does not get any exercise Is on home O2  Also has orthostatic hypotension   Sees a pulmonologist at the [facility], MD   Previously installed shelving    No fever,   No cough, dyspnea is chronic  No aches   The patient does not have symptoms concerning for COVID-19 infection (fever, chills, cough, or new shortness of breath).    Past Medical History:  Diagnosis Date   Acute deep vein thrombosis (DVT) of left lower extremity (HCC)    Acute hypoxemic respiratory failure (H[facility])    Anxiety    Aortic atherosclerosis (HCC)    Arthritis    Benign prostatic hyperplasia 08/28/2018   UNSPECIFIED WHETHER LOWER URINARY TRACT SYMPTOMS PRESENT   CAD (coronary artery disease)    Chronic back pain    Chronic hip pain    COPD exacerbation (H[facility]) 10/18/2017   Emphysema lung ([place])    Essential hypertension    GERD (gastroesophageal reflux disease)    Gout    Gross hematuria 09/08/2018   History of tobacco abuse    Hypoxia  Pulmonary embolism ([place]) 05/31/2018   Renal mass    CONCERNING FOR RENAL CELL CARCINOMA DR. Lovena Neighbours   Respiratory distress    Respiratory failure, acute-on-chronic ([place]) 08/03/2018   Seasonal allergies    Sleep apnea    Past Surgical History:  Procedure Laterality Date   HERNIA REPAIR  1977   MANDIBLE SURGERY  1976   EXTENSIVE     Current Meds  Medication Sig   acetaminophen (TYLENOL) 500 MG tablet Take 1,000 mg by mouth 2 (two) times daily.    albuterol (PROVENTIL HFA;VENTOLIN HFA) 108 (90 Base) MCG/ACT inhaler Inhale 2 puffs into the lungs every 2  (two) hours as needed for wheezing or shortness of breath.   albuterol (PROVENTIL) (2.5 MG/3ML) 0.083% nebulizer solution Take 3 mLs (2.5 mg total) by nebulization every 6 (six) hours as needed for wheezing or shortness of breath.   amLODipine (NORVASC) 2.5 MG tablet Take 2.5 mg by mouth daily.   apixaban (ELIQUIS) 5 MG TABS tablet Take 1 tablet (5 mg total) by mouth 2 (two) times daily.   budesonide-formoterol (SYMBICORT) 160-4.5 MCG/ACT inhaler Inhale 1 puff into the lungs 2 (two) times daily.   fluticasone (FLONASE) 50 MCG/ACT nasal spray Place 1 spray into both nostrils 2 (two) times daily.   gabapentin (NEURONTIN) 300 MG capsule Take 300 mg by mouth 2 (two) times daily.    polyvinyl alcohol (LIQUIFILM TEARS) 1.4 % ophthalmic solution Place 1 drop into both eyes as needed for dry eyes.   Tiotropium Bromide Monohydrate (SPIRIVA RESPIMAT) 1.25 MCG/ACT AERS Inhale 2 puffs into the lungs daily.     Allergies:   Patient has no known allergies.   Social History   Tobacco Use   Smoking status: Former Smoker    Packs/day: 1.00    Years: 26.00    Pack years: 26.00    Types: Cigarettes    Last attempt to quit: 10/18/2017    Years since quitting: 0.9   Smokeless tobacco: Never Used  Substance Use Topics   Alcohol use: Yes   Drug use: Yes    Types: Flunitrazepam     Family Hx: The patient's family history includes Diabetes in his mother; Hypertension in his brother and sister.  ROS:   Please see the history of present illness.     All other systems reviewed and are negative.   Prior CV studies:   The following studies were reviewed today:    Labs/Other Tests and Data Reviewed:    EKG:  An ECG dated  08/18/18 was personally reviewed today and demonstrated:   NSR at 91,  nonspecific ST abn in ant leads.   Recent Labs: 05/31/2018: ALT 18 06/02/2018: TSH 0.466 08/18/2018: Magnesium 2.2 08/24/2018: BUN 19; Creatinine, Ser 1.41; Hemoglobin 12.0; Platelets 202; Potassium  4.5; Sodium 139   Recent Lipid Panel No results found for: CHOL, TRIG, HDL, CHOLHDL, LDLCALC, LDLDIRECT  Wt Readings from Last 3 Encounters:  08/18/18 162 lb 4.1 oz (73.6 kg)  08/02/18 169 lb 8.5 oz (76.9 kg)  06/04/18 162 lb 7.7 oz (73.7 kg)     Objective:    Vital Signs:  BP (!) 124/58 (BP Location: Left Arm, Patient Position: Sitting, Cuff Size: Normal)    Pulse (!) 105    Ht 6\' 1"  (1.854 m)    BMI 21.41 kg/m    No exam - telephone note   ASSESSMENT & PLAN:    1. CAD -  Has 3 V Coronary artery calcification.   Has symptoms worrisome  of angina .   He will need a Lexiscan myoview this week for further eval.  No recent lipid levels  Family hx of HTN and DM   2.  Hx of pulmonary embolus:  Continue eliquis . He will need to hold for 1-2 days prior to surgery   3.  COPD :  Plans per Dr. Gwenette Greet at the [facility] in [place]  4.     COVID-19 Education: The signs and symptoms of COVID-19 were discussed with the patient and how to seek care for testing (follow up with PCP or arrange E-visit).  The importance of social distancing was discussed today.  Time:   Today, I have spent  35  minutes with the patient with telehealth technology discussing the above problems.     Medication Adjustments/Labs and Tests Ordered: Current medicines are reviewed at length with the patient today.  Concerns regarding medicines are outlined above.   Tests Ordered: No orders of the defined types were placed in this encounter.   Medication Changes: No orders of the defined types were placed in this encounter.   Disposition:  Follow up in 4 month(s)  Signed, Mertie Moores, MD  10/15/2018 9:58 AM    [facility]

## 2018-10-17 ENCOUNTER — Other Ambulatory Visit: Payer: Self-pay

## 2018-10-17 ENCOUNTER — Ambulatory Visit (HOSPITAL_COMMUNITY): Payer: Medicare HMO | Attending: Internal Medicine

## 2018-10-17 DIAGNOSIS — I2510 Atherosclerotic heart disease of native coronary artery without angina pectoris: Secondary | ICD-10-CM | POA: Diagnosis not present

## 2018-10-17 DIAGNOSIS — I2584 Coronary atherosclerosis due to calcified coronary lesion: Secondary | ICD-10-CM | POA: Diagnosis not present

## 2018-10-17 DIAGNOSIS — R0789 Other chest pain: Secondary | ICD-10-CM | POA: Diagnosis not present

## 2018-10-17 DIAGNOSIS — I2699 Other pulmonary embolism without acute cor pulmonale: Secondary | ICD-10-CM | POA: Insufficient documentation

## 2018-10-17 DIAGNOSIS — I10 Essential (primary) hypertension: Secondary | ICD-10-CM | POA: Insufficient documentation

## 2018-10-17 LAB — MYOCARDIAL PERFUSION IMAGING
LV dias vol: 57 mL (ref 62–150)
LV sys vol: 16 mL
Peak HR: 111 {beats}/min
Rest HR: 72 {beats}/min
SDS: 1
SRS: 0
SSS: 1
TID: 0.9

## 2018-10-17 MED ORDER — REGADENOSON 0.4 MG/5ML IV SOLN
0.4000 mg | Freq: Once | INTRAVENOUS | Status: AC
  Administered 2018-10-17: 0.4 mg via INTRAVENOUS

## 2018-10-17 MED ORDER — TECHNETIUM TC 99M TETROFOSMIN IV KIT
10.5000 | PACK | Freq: Once | INTRAVENOUS | Status: AC | PRN
  Administered 2018-10-17: 10.5 via INTRAVENOUS
  Filled 2018-10-17: qty 11

## 2018-10-17 MED ORDER — TECHNETIUM TC 99M TETROFOSMIN IV KIT
32.8000 | PACK | Freq: Once | INTRAVENOUS | Status: AC | PRN
  Administered 2018-10-17: 32.8 via INTRAVENOUS
  Filled 2018-10-17: qty 33

## 2018-10-20 ENCOUNTER — Telehealth: Payer: Self-pay | Admitting: Cardiovascular Disease

## 2018-10-20 NOTE — Telephone Encounter (Signed)
Agree with note by Doreene Adas, PA He is at low risk for his upcoming procedure     Mertie Moores, MD  10/20/2018 3:02 PM    [facility] [place],  [place] [place], [place]  [address] Pager [phone] Phone: [phone]; Fax: [phone]

## 2018-10-20 NOTE — Telephone Encounter (Signed)
   Primary Cardiologist: Mertie Moores, MD  Chart reviewed as part of pre-operative protocol coverage. Patient was contacted 10/20/2018 in reference to pre-operative risk assessment for pending surgery as outlined below.  Gregory Christensen was last seen on 10/15/18 by Dr. Acie Fredrickson.  At that visit, patient described symptoms concerning for angina. As a result, he underwent nuclear stress testing which revealed no ischemia or scar.   He may hold eliquis 1-2 days prior to procedure.  Therefore, based on ACC/AHA guidelines, the patient would be at acceptable risk for the planned procedure without further cardiovascular testing.   I will route this recommendation to the requesting party via Epic fax function and remove from pre-op pool.  Please call with questions.  Tami Lin Duke, PA 10/20/2018, 1:25 PM

## 2018-10-20 NOTE — Telephone Encounter (Signed)
Per call from [facility] with [facility] -  she needs a call back to get pt's surgical clearance done.  Please give her a call.

## 2018-10-23 NOTE — Progress Notes (Signed)
10-20-18 ( Epic) Cardiac Clearance by Dr. Acie Fredrickson  10-17-18 (Epic) Stress  08-18-18 (Epic) EKG, CXR  06-01-18 (Epic) ECHO

## 2018-10-23 NOTE — Patient Instructions (Addendum)
Gregory Christensen  10/23/2018   Your procedure is scheduled on: 10-31-18    Report to [facility] Main  Entrance    Report to Admitting at 10:30 AM    Call this number if you have problems the morning of surgery [phone]    Remember: Do not eat food or drink liquids :After Midnight.    BRUSH YOUR TEETH MORNING OF SURGERY AND RINSE YOUR MOUTH OUT, NO CHEWING GUM CANDY OR MINTS.     Take these medicines the morning of surgery with A SIP OF WATER: Amlodipine (Norvasc), Gabapentin (Neurontin), and Pantoprazole (Protonix), Loratadine (Claritin). You may also bring and use your inhaler, eyedrops, and nasal spray                                 You may not have any metal on your body including hair pins and              piercings  Do not wear jewelry, cologne, lotions, powders or deodorant             Men may shave face and neck.   Do not bring valuables to the hospital. [place].  Contacts, dentures or bridgework may not be worn into surgery.  Leave suitcase in the car. After surgery it may be brought to your room.     Patients discharged the day of surgery will not be allowed to drive home. IF YOU ARE HAVING SURGERY AND GOING HOME THE SAME DAY, YOU MUST HAVE AN ADULT TO DRIVE YOU HOME AND BE WITH YOU FOR 24 HOURS. YOU MAY GO HOME BY TAXI OR UBER OR ORTHERWISE, BUT AN ADULT MUST ACCOMPANY YOU HOME AND STAY WITH YOU FOR 24 HOURS.    Special Instructions: Please follow your prep per your surgeon's instructions              Please read over the following fact sheets you were given: _____________________________________________________________________             [facility] - Preparing for Surgery Before surgery, you can play an important role.  Because skin is not sterile, your skin needs to be as free of germs as possible.  You can reduce the number of germs on your skin by washing with CHG (chlorahexidine  gluconate) soap before surgery.  CHG is an antiseptic cleaner which kills germs and bonds with the skin to continue killing germs even after washing. Please DO NOT use if you have an allergy to CHG or antibacterial soaps.  If your skin becomes reddened/irritated stop using the CHG and inform your nurse when you arrive at Short Stay. Do not shave (including legs and underarms) for at least 48 hours prior to the first CHG shower.  You may shave your face/neck. Please follow these instructions carefully:  1.  Shower with CHG Soap the night before surgery and the  morning of Surgery.  2.  If you choose to wash your hair, wash your hair first as usual with your  normal  shampoo.  3.  After you shampoo, rinse your hair and body thoroughly to remove the  shampoo.  4.  Use CHG as you would any other liquid soap.  You can apply chg directly  to the skin and wash                       Gently with a scrungie or clean washcloth.  5.  Apply the CHG Soap to your body ONLY FROM THE NECK DOWN.   Do not use on face/ open                           Wound or open sores. Avoid contact with eyes, ears mouth and genitals (private parts).                       Wash face,  Genitals (private parts) with your normal soap.             6.  Wash thoroughly, paying special attention to the area where your surgery  will be performed.  7.  Thoroughly rinse your body with warm water from the neck down.  8.  DO NOT shower/wash with your normal soap after using and rinsing off  the CHG Soap.                9.  Pat yourself dry with a clean towel.            10.  Wear clean pajamas.            11.  Place clean sheets on your bed the night of your first shower and do not  sleep with pets. Day of Surgery : Do not apply any lotions/deodorants the morning of surgery.  Please wear clean clothes to the hospital/surgery center.  FAILURE TO FOLLOW THESE INSTRUCTIONS MAY RESULT IN THE CANCELLATION OF YOUR  SURGERY PATIENT SIGNATURE_________________________________  NURSE SIGNATURE__________________________________  ________________________________________________________________________  WHAT IS A BLOOD TRANSFUSION? Blood Transfusion Information  A transfusion is the replacement of blood or some of its parts. Blood is made up of multiple cells which provide different functions.  Red blood cells carry oxygen and are used for blood loss replacement.  White blood cells fight against infection.  Platelets control bleeding.  Plasma helps clot blood.  Other blood products are available for specialized needs, such as hemophilia or other clotting disorders. BEFORE THE TRANSFUSION  Who gives blood for transfusions?   Healthy volunteers who are fully evaluated to make sure their blood is safe. This is blood bank blood. Transfusion therapy is the safest it has ever been in the practice of medicine. Before blood is taken from a donor, a complete history is taken to make sure that person has no history of diseases nor engages in risky social behavior (examples are intravenous drug use or sexual activity with multiple partners). The donor's travel history is screened to minimize risk of transmitting infections, such as malaria. The donated blood is tested for signs of infectious diseases, such as HIV and hepatitis. The blood is then tested to be sure it is compatible with you in order to minimize the chance of a transfusion reaction. If you or a relative donates blood, this is often done in anticipation of surgery and is not appropriate for emergency situations. It takes many days to process the donated blood. RISKS AND COMPLICATIONS Although transfusion therapy is very safe and saves many lives, the main dangers of transfusion include:   Getting an infectious disease.  Developing a transfusion reaction. This  is an allergic reaction to something in the blood you were given. Every precaution is taken to  prevent this. The decision to have a blood transfusion has been considered carefully by your caregiver before blood is given. Blood is not given unless the benefits outweigh the risks. AFTER THE TRANSFUSION  Right after receiving a blood transfusion, you will usually feel much better and more energetic. This is especially true if your red blood cells have gotten low (anemic). The transfusion raises the level of the red blood cells which carry oxygen, and this usually causes an energy increase.  The nurse administering the transfusion will monitor you carefully for complications. HOME CARE INSTRUCTIONS  No special instructions are needed after a transfusion. You may find your energy is better. Speak with your caregiver about any limitations on activity for underlying diseases you may have. SEEK MEDICAL CARE IF:   Your condition is not improving after your transfusion.  You develop redness or irritation at the intravenous (IV) site. SEEK IMMEDIATE MEDICAL CARE IF:  Any of the following symptoms occur over the next 12 hours:  Shaking chills.  You have a temperature by mouth above 102 F (38.9 C), not controlled by medicine.  Chest, back, or muscle pain.  People around you feel you are not acting correctly or are confused.  Shortness of breath or difficulty breathing.  Dizziness and fainting.  You get a rash or develop hives.  You have a decrease in urine output.  Your urine turns a dark color or changes to pink, red, or brown. Any of the following symptoms occur over the next 10 days:  You have a temperature by mouth above 102 F (38.9 C), not controlled by medicine.  Shortness of breath.  Weakness after normal activity.  The white part of the eye turns yellow (jaundice).  You have a decrease in the amount of urine or are urinating less often.  Your urine turns a dark color or changes to pink, red, or brown. Document Released: 06/15/2000 Document Revised: 09/10/2011  Document Reviewed: 02/02/2008 [facility] Patient Information 2014 [place], [place].  _______________________________________________________________________

## 2018-10-24 ENCOUNTER — Encounter (HOSPITAL_COMMUNITY): Payer: Self-pay

## 2018-10-24 ENCOUNTER — Other Ambulatory Visit: Payer: Self-pay

## 2018-10-24 ENCOUNTER — Inpatient Hospital Stay (HOSPITAL_COMMUNITY): Admission: RE | Admit: 2018-10-24 | Payer: Medicare HMO | Source: Ambulatory Visit

## 2018-10-24 ENCOUNTER — Encounter (HOSPITAL_COMMUNITY)
Admission: RE | Admit: 2018-10-24 | Discharge: 2018-10-24 | Disposition: A | Discharge status: Home / Self Care | Payer: Medicare HMO | Source: Ambulatory Visit | Attending: Urology | Admitting: Urology

## 2018-10-24 DIAGNOSIS — R1909 Other intra-abdominal and pelvic swelling, mass and lump: Secondary | ICD-10-CM | POA: Insufficient documentation

## 2018-10-24 DIAGNOSIS — Z01812 Encounter for preprocedural laboratory examination: Secondary | ICD-10-CM | POA: Diagnosis not present

## 2018-10-24 LAB — COMPREHENSIVE METABOLIC PANEL
ALT: 20 U/L (ref 0–44)
AST: 20 U/L (ref 15–41)
Albumin: 4 g/dL (ref 3.5–5.0)
Alkaline Phosphatase: 89 U/L (ref 38–126)
Anion gap: 7 (ref 5–15)
BUN: 17 mg/dL (ref 8–23)
CO2: 23 mmol/L (ref 22–32)
Calcium: 9.1 mg/dL (ref 8.9–10.3)
Chloride: 109 mmol/L (ref 98–111)
Creatinine, Ser: 1.75 mg/dL — ABNORMAL HIGH (ref 0.61–1.24)
GFR calc Af Amer: 46 mL/min — ABNORMAL LOW (ref >60)
GFR calc non Af Amer: 39 mL/min — ABNORMAL LOW (ref >60)
Glucose, Bld: 106 mg/dL — ABNORMAL HIGH (ref 70–99)
Potassium: 4.5 mmol/L (ref 3.5–5.1)
Sodium: 139 mmol/L (ref 135–145)
Total Bilirubin: 0.3 mg/dL (ref 0.3–1.2)
Total Protein: 7.8 g/dL (ref 6.5–8.1)

## 2018-10-24 LAB — PROTIME-INR
INR: 1.3 — ABNORMAL HIGH (ref 0.8–1.2)
Prothrombin Time: 16.2 seconds — ABNORMAL HIGH (ref 11.4–15.2)

## 2018-10-24 LAB — CBC
HCT: 37.3 % — ABNORMAL LOW (ref 39.0–52.0)
Hemoglobin: 11.3 g/dL — ABNORMAL LOW (ref 13.0–17.0)
MCH: 26.8 pg (ref 26.0–34.0)
MCHC: 30.3 g/dL (ref 30.0–36.0)
MCV: 88.6 fL (ref 80.0–100.0)
Platelets: 189 10*3/uL (ref 150–400)
RBC: 4.21 MIL/uL — ABNORMAL LOW (ref 4.22–5.81)
RDW: 14.7 % (ref 11.5–15.5)
WBC: 6.6 10*3/uL (ref 4.0–10.5)
nRBC: 0 % (ref 0.0–0.2)

## 2018-10-24 NOTE — Progress Notes (Addendum)
10-23-18 CMP and PT results routed to Dr. Lovena Neighbours for review  Addendum: Lab called to advise that pt has positive antibodies. Order to collect T&S on day of surgery. Order placed.

## 2018-10-28 ENCOUNTER — Telehealth: Payer: Self-pay | Admitting: Cardiovascular Disease

## 2018-10-28 NOTE — Progress Notes (Signed)
Anesthesia Chart Review   Case:  [id number] Date/Time:  10/31/18 1200   Procedures:      XI ROBOT ASSITED LAPAROSCOPIC NEPHROURETERECTOMY (Left ) - ONLY NEEDS 240 MIN FOR ALL PROCEDURES     CYSTOSCOPY/URETEROSCOPY/HOLMIUM LASER/STENT PLACEMENT (Left )     TRANSURETHRAL RESECTION OF URETERAL ORIFICE (Left )   Anesthesia type:  General   Pre-op diagnosis:  LEFT RENAL PELVIS MASS   Location:  [facility] ROOM 03 / [facility]   Surgeon:  Ceasar Mons, MD      DISCUSSION: 68 yo former smoker (26 pack years, 10/18/17) with h/o anxiety, HTN, DVT, PE (11/19 on Eliquis), GERD, COPD (on 2L home O2, [facility]), CAD, BPH, left renal pelvis mass concerning for renal cell carcinoma scheduled for above procedure 10/31/18 with Dr. Harrell Gave Lovena Neighbours.   Pt evaluated by cardiologist, Dr. Mertie Moores, via telemedicine visit 10/15/18 for pre-op clearance.  Lexiscan ordered.  Lexiscan 10/17/18 without ischemia or acute findings, low risk study.  Per Dr. Acie Fredrickson, "He is at low risk for his upcoming surgery."  Pt is to hold Eliquis 1-2 days prior to surgery.   Pt will begin Pulmonary Rehab prescribed by pulmonologist, Dr. Gwenette Greet, due to emphysema once appointments have resumed, currently not scheduling due to [place].   Pt can proceed with planned procedure barring acute status change.  VS: BP 108/70 (BP Location: Left Arm)   Pulse (!) 104   Temp 36.7 C (Oral)   Ht 6\' 1"  (1.854 m)   Wt 78.6 kg   SpO2 100%   BMI 22.86 kg/m   PROVIDERS: Leighton Ruff, MD is PCP   Mertie Moores, MD is Cardiologist   Danton Sewer, MD is pulmonologist  LABS: Labs reviewed: Acceptable for surgery. (all labs ordered are listed, but only abnormal results are displayed)  Labs Reviewed  CBC - Abnormal; Notable for the following components:      Result Value   RBC 4.21 (*)    Hemoglobin 11.3 (*)    HCT 37.3 (*)    All other components within normal limits  COMPREHENSIVE METABOLIC  PANEL - Abnormal; Notable for the following components:   Glucose, Bld 106 (*)    Creatinine, Ser 1.75 (*)    GFR calc non Af Amer 39 (*)    GFR calc Af Amer 46 (*)    All other components within normal limits  PROTIME-INR - Abnormal; Notable for the following components:   Prothrombin Time 16.2 (*)    INR 1.3 (*)    All other components within normal limits  TYPE AND SCREEN     IMAGES: Chest Xray 08/18/18 FINDINGS: Normal heart size and mediastinal contours. No acute infiltrate or edema. Mild scarring in the right mid lung which was evaluated by CT earlier this month. No effusion or pneumothorax. No acute osseous findings.  IMPRESSION: Emphysema without acute superimposed finding.  EKG: 08/18/18 Rate 91 bpm Sinus rhythm  No significant change since last tracing   CV: 10/17/2018  Nuclear stress EF: 71%.  Normal perfusion with minimal soft tissue attenuation. No ischemia or scar  This is a low risk study.  Echo 06/01/18 Study Conclusions  - Left ventricle: The cavity size was normal. There was mild focal   basal hypertrophy of the septum. Systolic function was normal.   The estimated ejection fraction was in the range of 55% to 60%.   Wall motion was normal; there were no regional wall motion   abnormalities. Doppler  parameters are consistent with abnormal   left ventricular relaxation (grade 1 diastolic dysfunction). - Right ventricle: The cavity size was normal. Wall thickness was   normal.  Past Medical History:  Diagnosis Date  . Acute deep vein thrombosis (DVT) of left lower extremity ([place])   . Acute hypoxemic respiratory failure ([place])   . Anxiety   . Aortic atherosclerosis ([place])   . Arthritis   . Benign prostatic hyperplasia 08/28/2018   UNSPECIFIED WHETHER LOWER URINARY TRACT SYMPTOMS PRESENT  . CAD (coronary artery disease)   . Chronic back pain   . Chronic hip pain   . COPD exacerbation ([place]) 10/18/2017  . Emphysema lung ([place])   . Essential  hypertension   . GERD (gastroesophageal reflux disease)   . Gout   . Gross hematuria 09/08/2018  . History of tobacco abuse   . Hypoxia   . Pulmonary embolism ([place]) 05/31/2018  . Renal mass    CONCERNING FOR RENAL CELL CARCINOMA DR. Lovena Neighbours  . Respiratory distress   . Respiratory failure, acute-on-chronic ([place]) 08/03/2018  . Seasonal allergies   . Sleep apnea     Past Surgical History:  Procedure Laterality Date  . HERNIA REPAIR  1977  . MANDIBLE SURGERY  1976   EXTENSIVE    MEDICATIONS: . loratadine (CLARITIN) 10 MG tablet  . acetaminophen (TYLENOL) 500 MG tablet  . albuterol (PROVENTIL HFA;VENTOLIN HFA) 108 (90 Base) MCG/ACT inhaler  . albuterol (PROVENTIL) (2.5 MG/3ML) 0.083% nebulizer solution  . amLODipine (NORVASC) 2.5 MG tablet  . apixaban (ELIQUIS) 5 MG TABS tablet  . budesonide-formoterol (SYMBICORT) 160-4.5 MCG/ACT inhaler  . fluticasone (FLONASE) 50 MCG/ACT nasal spray  . gabapentin (NEURONTIN) 300 MG capsule  . ketotifen (ZADITOR) 0.025 % ophthalmic solution  . montelukast (SINGULAIR) 10 MG tablet  . pantoprazole (PROTONIX) 40 MG tablet  . Tiotropium Bromide Monohydrate (SPIRIVA RESPIMAT) 1.25 MCG/ACT AERS   No current facility-administered medications for this encounter.     [facility] Pre-Surgical Testing [phone] 10/28/18 11:26 AM

## 2018-10-28 NOTE — Anesthesia Preprocedure Evaluation (Addendum)
Anesthesia Evaluation  Patient identified by MRN, date of birth, ID band Patient awake    Reviewed: Allergy & Precautions, NPO status , Patient's Chart, lab work & pertinent test results  Airway Mallampati: II  TM Distance: >3 FB Neck ROM: Full    Dental  (+) Edentulous Upper, Dental Advisory Given   Pulmonary former smoker,     + decreased breath sounds      Cardiovascular hypertension,  Rhythm:Regular Rate:Normal     Neuro/Psych    GI/Hepatic   Endo/Other    Renal/GU      Musculoskeletal   Abdominal   Peds  Hematology   Anesthesia Other Findings   Reproductive/Obstetrics                           Anesthesia Physical Anesthesia Plan  ASA: III  Anesthesia Plan: General   Post-op Pain Management:    Induction: Intravenous  PONV Risk Score and Plan: Ondansetron and Dexamethasone  Airway Management Planned: Oral ETT  Additional Equipment:   Intra-op Plan:   Post-operative Plan: Extubation in OR  Informed Consent: I have reviewed the patients History and Physical, chart, labs and discussed the procedure including the risks, benefits and alternatives for the proposed anesthesia with the patient or authorized representative who has indicated his/her understanding and acceptance.     Dental advisory given  Plan Discussed with: CRNA and Anesthesiologist  Anesthesia Plan Comments: (See PAT note 10/24/18, Konrad Felix, PA-C)       Anesthesia Quick Evaluation

## 2018-10-28 NOTE — Telephone Encounter (Signed)
New Message    Pt is calling because he is wondering when he needs to stop taking his blood thinner    Please call

## 2018-10-28 NOTE — Telephone Encounter (Signed)
Left message for patient to call back  

## 2018-10-30 ENCOUNTER — Telehealth (HOSPITAL_COMMUNITY): Payer: Self-pay | Admitting: *Deleted

## 2018-10-30 NOTE — Telephone Encounter (Signed)
Called and spoke to pt in regards to Pulmonary Rehab continued closure due to adherence of national recommendation for Covid -19 in group setting. Pt verbalized understanding.  Pt stated that he is having surgery tomorrow at [facility].  Pt is going to have a XI ROBOT ASSITED LAPAROSCOPIC NEPHROURETERECTOMY for renal mass to his left kidney.  Pt thinks he will stay overnight but he is not for sure.  Pt will have his mother who is 45 and his ex-wife with him for the procedure.  Advised pt that I would hold off on sending him chair exercises he could do at home while waiting to schedule Pulmonary rehab.  Will continue to follow pt and call in a week or two.  Pt appreciated the call. Cherre Huger, BSN Cardiac and Training and development officer

## 2018-10-30 NOTE — Telephone Encounter (Signed)
Spoke with patient who states he stopped his Eliquis on Tuesday because no one told him when to stop it. I advised that I left a message for him to call back on Tuesday. I advised him to restart Eliquis the day after surgery unless he is told otherwise by the surgeon. He verbalized understanding and thanked me for the call.

## 2018-10-31 ENCOUNTER — Inpatient Hospital Stay (HOSPITAL_COMMUNITY): Payer: Medicare HMO | Admitting: Physician Assistant

## 2018-10-31 ENCOUNTER — Inpatient Hospital Stay (HOSPITAL_COMMUNITY)
Admission: RE | Admit: 2018-10-31 | Discharge: 2018-11-02 | DRG: 657 | Disposition: A | Discharge status: Home / Self Care | Payer: Medicare HMO | Attending: Urology | Admitting: Urology

## 2018-10-31 ENCOUNTER — Encounter (HOSPITAL_COMMUNITY): Payer: Self-pay | Admitting: General Practice

## 2018-10-31 ENCOUNTER — Encounter (HOSPITAL_COMMUNITY)
Admission: RE | Disposition: A | Discharge status: Home / Self Care | Payer: Self-pay | Source: Home / Self Care | Attending: Urology

## 2018-10-31 DIAGNOSIS — J9610 Chronic respiratory failure, unspecified whether with hypoxia or hypercapnia: Secondary | ICD-10-CM | POA: Diagnosis present

## 2018-10-31 DIAGNOSIS — C652 Malignant neoplasm of left renal pelvis: Secondary | ICD-10-CM | POA: Diagnosis not present

## 2018-10-31 DIAGNOSIS — I700 Atherosclerosis of aorta: Secondary | ICD-10-CM | POA: Diagnosis not present

## 2018-10-31 DIAGNOSIS — J9601 Acute respiratory failure with hypoxia: Secondary | ICD-10-CM | POA: Diagnosis not present

## 2018-10-31 DIAGNOSIS — Z86711 Personal history of pulmonary embolism: Secondary | ICD-10-CM

## 2018-10-31 DIAGNOSIS — I2510 Atherosclerotic heart disease of native coronary artery without angina pectoris: Secondary | ICD-10-CM | POA: Diagnosis present

## 2018-10-31 DIAGNOSIS — J439 Emphysema, unspecified: Secondary | ICD-10-CM | POA: Diagnosis not present

## 2018-10-31 DIAGNOSIS — N2889 Other specified disorders of kidney and ureter: Secondary | ICD-10-CM | POA: Diagnosis not present

## 2018-10-31 DIAGNOSIS — Z87891 Personal history of nicotine dependence: Secondary | ICD-10-CM | POA: Diagnosis not present

## 2018-10-31 DIAGNOSIS — N289 Disorder of kidney and ureter, unspecified: Secondary | ICD-10-CM | POA: Diagnosis present

## 2018-10-31 DIAGNOSIS — I10 Essential (primary) hypertension: Secondary | ICD-10-CM | POA: Diagnosis not present

## 2018-10-31 DIAGNOSIS — N400 Enlarged prostate without lower urinary tract symptoms: Secondary | ICD-10-CM | POA: Diagnosis not present

## 2018-10-31 DIAGNOSIS — Z86718 Personal history of other venous thrombosis and embolism: Secondary | ICD-10-CM | POA: Diagnosis not present

## 2018-10-31 DIAGNOSIS — J441 Chronic obstructive pulmonary disease with (acute) exacerbation: Secondary | ICD-10-CM | POA: Diagnosis not present

## 2018-10-31 HISTORY — PX: ROBOT ASSITED LAPAROSCOPIC NEPHROURETERECTOMY: SHX6077

## 2018-10-31 HISTORY — PX: CYSTOSCOPY/URETEROSCOPY/HOLMIUM LASER/STENT PLACEMENT: SHX6546

## 2018-10-31 LAB — BPAM RBC
Blood Product Expiration Date: 202005152359
Unit Type and Rh: 7300

## 2018-10-31 LAB — TYPE AND SCREEN
ABO/RH(D): B POS
Antibody Screen: POSITIVE
PT AG Type: NEGATIVE
Unit division: 0

## 2018-10-31 LAB — CREATININE, SERUM
Creatinine, Ser: 1.9 mg/dL — ABNORMAL HIGH (ref 0.61–1.24)
GFR calc Af Amer: 41 mL/min — ABNORMAL LOW (ref >60)
GFR calc non Af Amer: 36 mL/min — ABNORMAL LOW (ref >60)

## 2018-10-31 LAB — CBC
HCT: 35.7 % — ABNORMAL LOW (ref 39.0–52.0)
Hemoglobin: 10.7 g/dL — ABNORMAL LOW (ref 13.0–17.0)
MCH: 26.6 pg (ref 26.0–34.0)
MCHC: 30 g/dL (ref 30.0–36.0)
MCV: 88.6 fL (ref 80.0–100.0)
Platelets: 160 10*3/uL (ref 150–400)
RBC: 4.03 MIL/uL — ABNORMAL LOW (ref 4.22–5.81)
RDW: 14.6 % (ref 11.5–15.5)
WBC: 9.7 10*3/uL (ref 4.0–10.5)
nRBC: 0 % (ref 0.0–0.2)

## 2018-10-31 SURGERY — NEPHROURETERECTOMY, ROBOT-ASSISTED, LAPAROSCOPIC
Anesthesia: General | Laterality: Left

## 2018-10-31 MED ORDER — HYDROCODONE-ACETAMINOPHEN 5-325 MG PO TABS: 1.0000 | ORAL_TABLET | Freq: Four times a day (QID) | ORAL | 0 refills | Status: DC | PRN

## 2018-10-31 MED ORDER — ALBUTEROL SULFATE HFA 108 (90 BASE) MCG/ACT IN AERS
2.0000 | INHALATION_SPRAY | RESPIRATORY_TRACT | Status: DC | PRN
  Administered 2018-10-31: 2 via RESPIRATORY_TRACT

## 2018-10-31 MED ORDER — OXYCODONE HCL 5 MG/5ML PO SOLN: 5.0000 mg | Freq: Once | ORAL | Status: DC | PRN

## 2018-10-31 MED ORDER — MOMETASONE FURO-FORMOTEROL FUM 200-5 MCG/ACT IN AERO
2.0000 | INHALATION_SPRAY | Freq: Two times a day (BID) | RESPIRATORY_TRACT | Status: DC
  Administered 2018-11-01 – 2018-11-02 (×3): 2 via RESPIRATORY_TRACT
  Filled 2018-10-31: qty 8.8

## 2018-10-31 MED ORDER — DEXAMETHASONE SODIUM PHOSPHATE 10 MG/ML IJ SOLN
INTRAMUSCULAR | Status: DC | PRN
  Administered 2018-10-31: 10 mg via INTRAVENOUS

## 2018-10-31 MED ORDER — SUGAMMADEX SODIUM 200 MG/2ML IV SOLN
INTRAVENOUS | Status: DC | PRN
  Administered 2018-10-31: 200 mg via INTRAVENOUS

## 2018-10-31 MED ORDER — BUPIVACAINE-EPINEPHRINE 0.25% -1:200000 IJ SOLN
INTRAMUSCULAR | Status: DC | PRN
  Administered 2018-10-31: 30 mL

## 2018-10-31 MED ORDER — ONDANSETRON HCL 4 MG/2ML IJ SOLN
4.0000 mg | Freq: Once | INTRAMUSCULAR | Status: AC | PRN
  Administered 2018-10-31: 4 mg via INTRAVENOUS

## 2018-10-31 MED ORDER — MAGNESIUM CITRATE PO SOLN: 1.0000 | Freq: Once | ORAL | Status: DC

## 2018-10-31 MED ORDER — FENTANYL CITRATE (PF) 100 MCG/2ML IJ SOLN
INTRAMUSCULAR | Status: AC
  Filled 2018-10-31: qty 4

## 2018-10-31 MED ORDER — ROCURONIUM BROMIDE 10 MG/ML (PF) SYRINGE
PREFILLED_SYRINGE | INTRAVENOUS | Status: AC
  Filled 2018-10-31: qty 10

## 2018-10-31 MED ORDER — DEXTROSE-NACL 5-0.45 % IV SOLN
INTRAVENOUS | Status: DC
  Administered 2018-10-31: 19:00:00 via INTRAVENOUS

## 2018-10-31 MED ORDER — SUGAMMADEX SODIUM 200 MG/2ML IV SOLN
INTRAVENOUS | Status: AC
  Filled 2018-10-31: qty 2

## 2018-10-31 MED ORDER — UMECLIDINIUM BROMIDE 62.5 MCG/INH IN AEPB
2.0000 | INHALATION_SPRAY | Freq: Every day | RESPIRATORY_TRACT | Status: DC
  Administered 2018-11-01 – 2018-11-02 (×2): 2 via RESPIRATORY_TRACT
  Filled 2018-10-31: qty 7

## 2018-10-31 MED ORDER — DEXAMETHASONE SODIUM PHOSPHATE 10 MG/ML IJ SOLN
INTRAMUSCULAR | Status: AC
  Filled 2018-10-31: qty 1

## 2018-10-31 MED ORDER — ALBUTEROL SULFATE HFA 108 (90 BASE) MCG/ACT IN AERS
INHALATION_SPRAY | RESPIRATORY_TRACT | Status: AC
  Filled 2018-10-31: qty 6.7

## 2018-10-31 MED ORDER — BUPIVACAINE LIPOSOME 1.3 % IJ SUSP
20.0000 mL | Freq: Once | INTRAMUSCULAR | Status: AC
  Administered 2018-10-31: 17:00:00 20 mL
  Filled 2018-10-31: qty 20

## 2018-10-31 MED ORDER — GABAPENTIN 300 MG PO CAPS
300.0000 mg | ORAL_CAPSULE | Freq: Two times a day (BID) | ORAL | Status: DC
  Administered 2018-10-31 – 2018-11-02 (×4): 300 mg via ORAL
  Filled 2018-10-31 (×4): qty 1

## 2018-10-31 MED ORDER — LACTATED RINGERS IV SOLN
INTRAVENOUS | Status: DC
  Administered 2018-10-31 (×2): via INTRAVENOUS

## 2018-10-31 MED ORDER — MIDAZOLAM HCL 5 MG/5ML IJ SOLN
INTRAMUSCULAR | Status: DC | PRN
  Administered 2018-10-31: 2 mg via INTRAVENOUS

## 2018-10-31 MED ORDER — CEFAZOLIN SODIUM-DEXTROSE 1-4 GM/50ML-% IV SOLN
1.0000 g | Freq: Three times a day (TID) | INTRAVENOUS | Status: AC
  Administered 2018-10-31 – 2018-11-01 (×2): 1 g via INTRAVENOUS
  Filled 2018-10-31 (×2): qty 50

## 2018-10-31 MED ORDER — LORATADINE 10 MG PO TABS
10.0000 mg | ORAL_TABLET | Freq: Every day | ORAL | Status: DC
  Administered 2018-11-01: 10 mg via ORAL
  Filled 2018-10-31 (×2): qty 1

## 2018-10-31 MED ORDER — CEFAZOLIN SODIUM-DEXTROSE 2-4 GM/100ML-% IV SOLN
2.0000 g | Freq: Once | INTRAVENOUS | Status: AC
  Administered 2018-10-31: 2 g via INTRAVENOUS
  Filled 2018-10-31: qty 100

## 2018-10-31 MED ORDER — ONDANSETRON HCL 4 MG/2ML IJ SOLN
INTRAMUSCULAR | Status: DC | PRN
  Administered 2018-10-31: 4 mg via INTRAVENOUS

## 2018-10-31 MED ORDER — LACTATED RINGERS IR SOLN
Status: DC | PRN
  Administered 2018-10-31: 1000 mL

## 2018-10-31 MED ORDER — FENTANYL CITRATE (PF) 100 MCG/2ML IJ SOLN
INTRAMUSCULAR | Status: AC
  Filled 2018-10-31: qty 2

## 2018-10-31 MED ORDER — FENTANYL CITRATE (PF) 250 MCG/5ML IJ SOLN
INTRAMUSCULAR | Status: AC
  Filled 2018-10-31: qty 5

## 2018-10-31 MED ORDER — OXYCODONE HCL 5 MG PO TABS: 5.0000 mg | ORAL_TABLET | Freq: Once | ORAL | Status: DC | PRN

## 2018-10-31 MED ORDER — SUCCINYLCHOLINE CHLORIDE 200 MG/10ML IV SOSY
PREFILLED_SYRINGE | INTRAVENOUS | Status: AC
  Filled 2018-10-31: qty 10

## 2018-10-31 MED ORDER — BUPIVACAINE-EPINEPHRINE (PF) 0.25% -1:200000 IJ SOLN
INTRAMUSCULAR | Status: AC
  Filled 2018-10-31: qty 30

## 2018-10-31 MED ORDER — AMLODIPINE BESYLATE 5 MG PO TABS
2.5000 mg | ORAL_TABLET | Freq: Every day | ORAL | Status: DC
  Administered 2018-11-01 – 2018-11-02 (×2): 2.5 mg via ORAL
  Filled 2018-10-31 (×2): qty 1

## 2018-10-31 MED ORDER — ACETAMINOPHEN 500 MG PO TABS
1000.0000 mg | ORAL_TABLET | Freq: Four times a day (QID) | ORAL | Status: AC
  Administered 2018-10-31 – 2018-11-01 (×3): 1000 mg via ORAL
  Filled 2018-10-31 (×3): qty 2

## 2018-10-31 MED ORDER — PROPOFOL 10 MG/ML IV BOLUS
INTRAVENOUS | Status: DC | PRN
  Administered 2018-10-31: 160 mg via INTRAVENOUS

## 2018-10-31 MED ORDER — BACITRACIN-NEOMYCIN-POLYMYXIN 400-5-5000 EX OINT: 1.0000 "application " | TOPICAL_OINTMENT | Freq: Three times a day (TID) | CUTANEOUS | Status: DC | PRN

## 2018-10-31 MED ORDER — LIDOCAINE 2% (20 MG/ML) 5 ML SYRINGE
INTRAMUSCULAR | Status: AC
  Filled 2018-10-31: qty 5

## 2018-10-31 MED ORDER — PROPOFOL 10 MG/ML IV BOLUS
INTRAVENOUS | Status: AC
  Filled 2018-10-31: qty 20

## 2018-10-31 MED ORDER — PROMETHAZINE HCL 12.5 MG PO TABS: 12.5000 mg | ORAL_TABLET | ORAL | 0 refills | Status: DC | PRN

## 2018-10-31 MED ORDER — BELLADONNA ALKALOIDS-OPIUM 16.2-60 MG RE SUPP: 1.0000 | Freq: Four times a day (QID) | RECTAL | Status: DC | PRN

## 2018-10-31 MED ORDER — DOCUSATE SODIUM 100 MG PO CAPS: 100.0000 mg | ORAL_CAPSULE | Freq: Two times a day (BID) | ORAL | Status: DC

## 2018-10-31 MED ORDER — OXYCODONE HCL 5 MG PO TABS
5.0000 mg | ORAL_TABLET | ORAL | Status: DC | PRN
  Administered 2018-11-01: 5 mg via ORAL
  Filled 2018-10-31: qty 1

## 2018-10-31 MED ORDER — FENTANYL CITRATE (PF) 100 MCG/2ML IJ SOLN
25.0000 ug | INTRAMUSCULAR | Status: DC | PRN
  Administered 2018-10-31 (×2): 50 ug via INTRAVENOUS

## 2018-10-31 MED ORDER — 0.9 % SODIUM CHLORIDE (POUR BTL) OPTIME
TOPICAL | Status: DC | PRN
  Administered 2018-10-31: 14:00:00 1000 mL

## 2018-10-31 MED ORDER — PHENYLEPHRINE 40 MCG/ML (10ML) SYRINGE FOR IV PUSH (FOR BLOOD PRESSURE SUPPORT)
PREFILLED_SYRINGE | INTRAVENOUS | Status: AC
  Filled 2018-10-31: qty 10

## 2018-10-31 MED ORDER — LIDOCAINE 2% (20 MG/ML) 5 ML SYRINGE
INTRAMUSCULAR | Status: DC | PRN
  Administered 2018-10-31: 80 mg via INTRAVENOUS

## 2018-10-31 MED ORDER — ONDANSETRON HCL 4 MG/2ML IJ SOLN: 4.0000 mg | INTRAMUSCULAR | Status: DC | PRN

## 2018-10-31 MED ORDER — HYDROMORPHONE HCL 1 MG/ML IJ SOLN
0.5000 mg | INTRAMUSCULAR | Status: DC | PRN
  Administered 2018-10-31: 1 mg via INTRAVENOUS
  Filled 2018-10-31: qty 1

## 2018-10-31 MED ORDER — DIPHENHYDRAMINE HCL 50 MG/ML IJ SOLN: 12.5000 mg | Freq: Four times a day (QID) | INTRAMUSCULAR | Status: DC | PRN

## 2018-10-31 MED ORDER — FENTANYL CITRATE (PF) 100 MCG/2ML IJ SOLN
INTRAMUSCULAR | Status: DC | PRN
  Administered 2018-10-31 (×7): 50 ug via INTRAVENOUS

## 2018-10-31 MED ORDER — HEPARIN SODIUM (PORCINE) 5000 UNIT/ML IJ SOLN
5000.0000 [IU] | Freq: Three times a day (TID) | INTRAMUSCULAR | Status: DC
  Administered 2018-10-31 – 2018-11-02 (×5): 5000 [IU] via SUBCUTANEOUS
  Filled 2018-10-31 (×5): qty 1

## 2018-10-31 MED ORDER — PHENYLEPHRINE 40 MCG/ML (10ML) SYRINGE FOR IV PUSH (FOR BLOOD PRESSURE SUPPORT)
PREFILLED_SYRINGE | INTRAVENOUS | Status: DC | PRN
  Administered 2018-10-31 (×3): 80 ug via INTRAVENOUS

## 2018-10-31 MED ORDER — ALBUTEROL SULFATE (2.5 MG/3ML) 0.083% IN NEBU
2.5000 mg | INHALATION_SOLUTION | Freq: Four times a day (QID) | RESPIRATORY_TRACT | Status: DC | PRN
  Administered 2018-10-31: 2.5 mg via RESPIRATORY_TRACT
  Filled 2018-10-31: qty 3

## 2018-10-31 MED ORDER — DOCUSATE SODIUM 100 MG PO CAPS
100.0000 mg | ORAL_CAPSULE | Freq: Two times a day (BID) | ORAL | Status: DC
  Administered 2018-10-31 – 2018-11-02 (×4): 100 mg via ORAL
  Filled 2018-10-31 (×4): qty 1

## 2018-10-31 MED ORDER — DIPHENHYDRAMINE HCL 12.5 MG/5ML PO ELIX: 12.5000 mg | ORAL_SOLUTION | Freq: Four times a day (QID) | ORAL | Status: DC | PRN

## 2018-10-31 MED ORDER — STERILE WATER FOR IRRIGATION IR SOLN
Status: DC | PRN
  Administered 2018-10-31: 1000 mL

## 2018-10-31 MED ORDER — SODIUM CHLORIDE (PF) 0.9 % IJ SOLN
INTRAMUSCULAR | Status: AC
  Filled 2018-10-31: qty 20

## 2018-10-31 MED ORDER — MIDAZOLAM HCL 2 MG/2ML IJ SOLN
INTRAMUSCULAR | Status: AC
  Filled 2018-10-31: qty 2

## 2018-10-31 MED ORDER — PANTOPRAZOLE SODIUM 40 MG PO TBEC
40.0000 mg | DELAYED_RELEASE_TABLET | Freq: Every day | ORAL | Status: DC
  Administered 2018-11-01 – 2018-11-02 (×2): 40 mg via ORAL
  Filled 2018-10-31 (×2): qty 1

## 2018-10-31 MED ORDER — ONDANSETRON HCL 4 MG/2ML IJ SOLN
INTRAMUSCULAR | Status: AC
  Filled 2018-10-31: qty 2

## 2018-10-31 MED ORDER — ROCURONIUM BROMIDE 10 MG/ML (PF) SYRINGE
PREFILLED_SYRINGE | INTRAVENOUS | Status: DC | PRN
  Administered 2018-10-31: 20 mg via INTRAVENOUS
  Administered 2018-10-31: 5 mg via INTRAVENOUS
  Administered 2018-10-31: 35 mg via INTRAVENOUS
  Administered 2018-10-31: 20 mg via INTRAVENOUS
  Administered 2018-10-31: 10 mg via INTRAVENOUS

## 2018-10-31 MED ORDER — SUCCINYLCHOLINE CHLORIDE 200 MG/10ML IV SOSY
PREFILLED_SYRINGE | INTRAVENOUS | Status: DC | PRN
  Administered 2018-10-31: 140 mg via INTRAVENOUS

## 2018-10-31 SURGICAL SUPPLY — 82 items
ADAPTER GOLDBERG URETERAL (ADAPTER) ×3 IMPLANT
BAG LAPAROSCOPIC 12 15 PORT 16 (BASKET) ×1 IMPLANT
BAG RETRIEVAL 12/15 (BASKET) ×2
BAG RETRIEVAL 12/15MM (BASKET) ×1
BAG URINE LEG 500ML (DRAIN) ×3 IMPLANT
BAG URO CATCHER STRL LF (MISCELLANEOUS) ×3 IMPLANT
BASKET ZERO TIP NITINOL 2.4FR (BASKET) IMPLANT
CATH FOLEY 3WAY 30CC 20FR (CATHETERS) ×3 IMPLANT
CATH URET 5FR 28IN OPEN ENDED (CATHETERS) ×3 IMPLANT
CATH URET DUAL LUMEN 6-10FR 50 (CATHETERS) ×3 IMPLANT
CHLORAPREP W/TINT 26 (MISCELLANEOUS) ×3 IMPLANT
CLIP VESOLOCK LG 6/CT PURPLE (CLIP) ×3 IMPLANT
CLIP VESOLOCK MED LG 6/CT (CLIP) ×3 IMPLANT
CLOTH BEACON ORANGE TIMEOUT ST (SAFETY) ×3 IMPLANT
COVER SURGICAL LIGHT HANDLE (MISCELLANEOUS) ×3 IMPLANT
COVER TIP SHEARS 8 DVNC (MISCELLANEOUS) ×1 IMPLANT
COVER TIP SHEARS 8MM DA VINCI (MISCELLANEOUS) ×2
COVER WAND RF STERILE (DRAPES) IMPLANT
CUTTER ECHEON FLEX ENDO 45 340 (ENDOMECHANICALS) ×3 IMPLANT
CUTTER FLEX LINEAR 45M (STAPLE) IMPLANT
DECANTER SPIKE VIAL GLASS SM (MISCELLANEOUS) ×3 IMPLANT
DERMABOND ADVANCED (GAUZE/BANDAGES/DRESSINGS) ×2
DERMABOND ADVANCED .7 DNX12 (GAUZE/BANDAGES/DRESSINGS) ×1 IMPLANT
DRAIN CHANNEL 15F RND FF 3/16 (WOUND CARE) ×3 IMPLANT
DRAPE ARM DVNC X/XI (DISPOSABLE) ×4 IMPLANT
DRAPE COLUMN DVNC XI (DISPOSABLE) ×1 IMPLANT
DRAPE DA VINCI XI ARM (DISPOSABLE) ×8
DRAPE DA VINCI XI COLUMN (DISPOSABLE) ×2
DRAPE INCISE IOBAN 66X45 STRL (DRAPES) ×3 IMPLANT
DRAPE LAPAROSCOPIC ABDOMINAL (DRAPES) ×3 IMPLANT
DRAPE SHEET LG 3/4 BI-LAMINATE (DRAPES) ×6 IMPLANT
DRSG TEGADERM 4X4.75 (GAUZE/BANDAGES/DRESSINGS) ×3 IMPLANT
ELECT PENCIL ROCKER SW 15FT (MISCELLANEOUS) ×3 IMPLANT
ELECT REM PT RETURN 15FT ADLT (MISCELLANEOUS) ×3 IMPLANT
EVACUATOR SILICONE 100CC (DRAIN) ×3 IMPLANT
EXTRACTOR STONE NITINOL NGAGE (UROLOGICAL SUPPLIES) IMPLANT
FIBER LASER FLEXIVA 365 (UROLOGICAL SUPPLIES) IMPLANT
FIBER LASER TRAC TIP (UROLOGICAL SUPPLIES) IMPLANT
GAUZE SPONGE 4X4 12PLY STRL (GAUZE/BANDAGES/DRESSINGS) ×3 IMPLANT
GLOVE BIO SURGEON STRL SZ 6.5 (GLOVE) ×2 IMPLANT
GLOVE BIO SURGEONS STRL SZ 6.5 (GLOVE) ×1
GLOVE BIOGEL M STRL SZ7.5 (GLOVE) ×6 IMPLANT
GOWN STRL REUS W/TWL LRG LVL3 (GOWN DISPOSABLE) ×15 IMPLANT
GOWN STRL REUS W/TWL XL LVL3 (GOWN DISPOSABLE) ×6 IMPLANT
GUIDEWIRE STR DUAL SENSOR (WIRE) IMPLANT
GUIDEWIRE ZIPWRE .038 STRAIGHT (WIRE) ×3 IMPLANT
IRRIG SUCT STRYKERFLOW 2 WTIP (MISCELLANEOUS)
IRRIGATION SUCT STRKRFLW 2 WTP (MISCELLANEOUS) IMPLANT
KIT BASIN OR (CUSTOM PROCEDURE TRAY) ×3 IMPLANT
KIT TURNOVER KIT A (KITS) ×6 IMPLANT
MANIFOLD NEPTUNE II (INSTRUMENTS) ×3 IMPLANT
NS IRRIG 1000ML POUR BTL (IV SOLUTION) ×3 IMPLANT
PACK CYSTO (CUSTOM PROCEDURE TRAY) ×3 IMPLANT
PROTECTOR NERVE ULNAR (MISCELLANEOUS) ×6 IMPLANT
RELOAD 45 VASCULAR/THIN (ENDOMECHANICALS) IMPLANT
SEAL CANN UNIV 5-8 DVNC XI (MISCELLANEOUS) ×4 IMPLANT
SEAL XI 5MM-8MM UNIVERSAL (MISCELLANEOUS) ×8
SHEATH URETERAL 12FRX35CM (MISCELLANEOUS) IMPLANT
SOLUTION ELECTROLUBE (MISCELLANEOUS) ×3 IMPLANT
STAPLE RELOAD 45 WHT (STAPLE) ×3 IMPLANT
STAPLE RELOAD 45MM WHITE (STAPLE) ×6
STENT URET 6FRX26 CONTOUR (STENTS) IMPLANT
SUT ETHILON 3 0 PS 1 (SUTURE) ×3 IMPLANT
SUT MNCRL AB 4-0 PS2 18 (SUTURE) ×6 IMPLANT
SUT PDS AB 1 CTX 36 (SUTURE) ×6 IMPLANT
SUT SILK 2 0 (SUTURE) ×2
SUT SILK 2-0 18XBRD TIE 12 (SUTURE) ×1 IMPLANT
SUT V-LOC BARB 180 2/0GR6 GS22 (SUTURE)
SUT VIC AB 2-0 SH 27 (SUTURE) ×2
SUT VIC AB 2-0 SH 27X BRD (SUTURE) ×1 IMPLANT
SUT VICRYL 0 UR6 27IN ABS (SUTURE) ×6 IMPLANT
SUT VLOC BARB 180 ABS3/0GR12 (SUTURE)
SUTURE V-LC BRB 180 2/0GR6GS22 (SUTURE) IMPLANT
SUTURE VLOC BRB 180 ABS3/0GR12 (SUTURE) IMPLANT
TOWEL OR NON WOVEN STRL DISP B (DISPOSABLE) ×3 IMPLANT
TRAY FOLEY MTR SLVR 16FR STAT (SET/KITS/TRAYS/PACK) IMPLANT
TRAY LAPAROSCOPIC (CUSTOM PROCEDURE TRAY) ×3 IMPLANT
TROCAR XCEL 12X100 BLDLESS (ENDOMECHANICALS) ×3 IMPLANT
TUBING CONNECTING 10 (TUBING) ×2 IMPLANT
TUBING CONNECTING 10' (TUBING) ×1
TUBING UROLOGY SET (TUBING) ×3 IMPLANT
WATER STERILE IRR 1000ML POUR (IV SOLUTION) ×6 IMPLANT

## 2018-10-31 NOTE — H&P (Signed)
Urology Preoperative H&P   Chief Complaint:  Left renal pelvis mass  History of Present Illness: Gregory Christensen is a 68 y.o. male with a 3 cm solid and enhancing left renal pelvis mass with features concerning for urothelial carcinoma.  He initially presented with left flank pain and gross hematuria.  Currently, he is voiding without difficulty.    CT UROGRAM (09/12/18) IMPRESSION:  1. Large, enhancing soft tissue lesion in the left renal pelvis  generates mild to moderate left hydronephrosis with evidence of  obstructive uropathy. Imaging features compatible with urothelial  neoplasm.  2. Small left para-aortic lymph nodes evident without abdominal  lymphadenopathy. No findings to suggest metastatic disease in the  abdomen/pelvis.  3.  Aortic Atherosclerois  4.  Emphysema.   Past Medical History:  Diagnosis Date  . Acute deep vein thrombosis (DVT) of left lower extremity ([place])   . Acute hypoxemic respiratory failure ([place])   . Anxiety   . Aortic atherosclerosis ([place])   . Arthritis   . Benign prostatic hyperplasia 08/28/2018   UNSPECIFIED WHETHER LOWER URINARY TRACT SYMPTOMS PRESENT  . CAD (coronary artery disease)   . Chronic back pain   . Chronic hip pain   . COPD exacerbation ([place]) 10/18/2017  . Emphysema lung ([place])   . Essential hypertension   . GERD (gastroesophageal reflux disease)   . Gout   . Gross hematuria 09/08/2018  . History of tobacco abuse   . Hypoxia   . Pulmonary embolism ([place]) 05/31/2018  . Renal mass    CONCERNING FOR RENAL CELL CARCINOMA DR. Lovena Neighbours  . Respiratory distress   . Respiratory failure, acute-on-chronic ([place]) 08/03/2018  . Seasonal allergies   . Sleep apnea     Past Surgical History:  Procedure Laterality Date  . HERNIA REPAIR  1977  . MANDIBLE SURGERY  1976   EXTENSIVE    Allergies: No Known Allergies  Family History  Problem Relation Age of Onset  . Diabetes Mother   . Hypertension Sister   . Hypertension Brother     Social  History:  reports that he quit smoking about a year ago. His smoking use included cigarettes. He has a 26.00 pack-year smoking history. He has never used smokeless tobacco. He reports current alcohol use of about 1.0 standard drinks of alcohol per week. He reports current drug use. Drug: Flunitrazepam.  ROS: A complete review of systems was performed.  All systems are negative except for pertinent findings as noted.  Physical Exam:  Vital signs in last 24 hours: Temp:  [98.2 F (36.8 C)] 98.2 F (36.8 C) (05/01 1009) Pulse Rate:  [100] 100 (05/01 1009) Resp:  [20] 20 (05/01 1009) BP: (129)/(71) 129/71 (05/01 1009) SpO2:  [96 %] 96 % (05/01 1009) Constitutional:  Alert and oriented, No acute distress Cardiovascular: Regular rate and rhythm, No JVD Respiratory: Normal respiratory effort, Lungs clear bilaterally GI: Abdomen is soft, nontender, nondistended, no abdominal masses GU: No CVA tenderness Lymphatic: No lymphadenopathy Neurologic: Grossly intact, no focal deficits Psychiatric: Normal mood and affect  Laboratory Data:  No results for input(s): WBC, HGB, HCT, PLT in the last 72 hours.  No results for input(s): NA, K, CL, GLUCOSE, BUN, CALCIUM, CREATININE in the last 72 hours.  Invalid input(s): CO3   No results found for this or any previous visit (from the past 24 hour(s)). No results found for this or any previous visit (from the past 240 hour(s)).  Renal Function: Recent Labs    10/24/18 1150  CREATININE  1.75*   Estimated Creatinine Clearance: 45.5 mL/min (A) (by C-G formula based on SCr of 1.75 mg/dL (H)).  Radiologic Imaging: No results found.  I independently reviewed the above imaging studies.  Assessment and Plan Gregory Christensen is a 68 y.o. male with a 3 cm solid and enhancing left renal pelvis mass with features concerning for urothelial carcinoma.    I reviewed imaging results and films with the patient personally. We discussed that the mass in question  has features concerning for malignancy. I explained the natural history of presumed urothelial carcinoma. I reviewed the AUA guidelines for evaluation and treatment of renal masses. The options of active surveillance, in situ tumor ablation, partial and radical nephrectomy was discussed. The risks of cystoscopy with left ureteroscopy, left ureteral stent placement and transurethral resection of the left ureteral orifice and robot assisted laparoscopic radical nephroureterectomy was discussed in detail including but not limited to: open conversion, infection of the skin/abdominal cavity, VTE, MI/CVA, lymphatic leak, injury to adjacent solid/hollow viscus organs, bleeding requiring a blood transfusion, catastrophic bleeding, hernia formation and other imponderables. The patient voices understanding and wishes to proceed.   Ellison Hughs, MD 10/31/2018, 10:30 AM  [facility] Pager: [phone]

## 2018-10-31 NOTE — Transfer of Care (Signed)
Immediate Anesthesia Transfer of Care Note  Patient: Gregory Christensen  Procedure(s) Performed: XI ROBOT ASSITED LAPAROSCOPIC NEPHROURETERECTOMY (Left ) CYSTOSCOPY/URETEROSCOPY (Left )  Patient Location: PACU  Anesthesia Type:General  Level of Consciousness: awake, alert  and oriented  Airway & Oxygen Therapy: Patient Spontanous Breathing and Patient connected to face mask oxygen  Post-op Assessment: Report given to RN and Post -op Vital signs reviewed and stable  Post vital signs: Reviewed and stable  Last Vitals:  Vitals Value Taken Time  BP    Temp    Pulse 75 10/31/2018  5:17 PM  Resp 17 10/31/2018  5:17 PM  SpO2 100 % 10/31/2018  5:17 PM  Vitals shown include unvalidated device data.  Last Pain:  Vitals:   10/31/18 1033  TempSrc:   PainSc: 6          Complications: No apparent anesthesia complications

## 2018-10-31 NOTE — Op Note (Signed)
Operative Note  Preoperative diagnosis:  1.  3 cm solid and enhancing left renal pelvis mass  Postoperative diagnosis: 1.  3 cm papillary left renal pelvis mass with features consistent with urothelial carcinoma  Procedure(s): 1.  Cystoscopy with left ureteroscopy and left ureteral stent placement 2.  Robot-assisted laparoscopic left nephroureterectomy  Surgeon: Ellison Hughs, MD  Assistants 1.  Debbrah Alar, PA-C An assistant was required for this surgical procedure.  The duties of the assistant included but were not limited to suctioning, passing suture, camera manipulation, retraction.  This procedure would not be able to be performed without an Environmental consultant.  2.  Burnis Kingfisher, MD PGY 4  Anesthesia:  General  Complications:  None  EBL: 100 mL  Specimens: 1.  Left kidney and ureter  Drains/Catheters: 1.  20 French Foley catheter with 20 mL in the balloon 2.  Left lower quadrant JP drain  Intraoperative findings:   1. Ureteroscopy revealed a large papillary left renal pelvis mass with features consistent with urothelial carcinoma.  No additional tumor or lesions were seen along the remaining distal aspects of the left ureter.   Indication:  Gregory Christensen is a 68 y.o. male with a 3 cm solid and enhancing left renal pelvis mass with features concerning for urothelial carcinoma.  He has been consented for the above procedures, voices understanding and wishes to proceed.  Description of procedure:  After informed consent was obtained, the patient was brought to the operating room and general endotracheal anesthesia was administered. The patient was then placed in the dorsolithotomy position and prepped and draped in the usual sterile fashion. A timeout was performed. A 23 French rigid cystoscope was then inserted into the urethral meatus and advanced into the bladder under direct vision. A complete bladder survey revealed no intravesical pathology.  A Glidewire was then  inserted into the left ureteral orifice and advanced up to the left renal pelvis.  A flexible ureteroscope was then advanced over the wire and into position within the left renal pelvis.  A full inspection of the left renal pelvis revealed a large papillary mass consistent with the solid and enhancing mass seen on his recent CT.  The flexible ureteroscope was then removed under direct vision, revealing no additional tumor along the remaining length of the left ureter.  A 5 French ureteral catheter was then advanced over the wire and into position within the left ureter.  A 20 French three-way Foley catheter was then inserted.  The ureteral catheter was then secured to the Foley catheter and a Goldberger apparatus was attached for drainage.   The patient was then placed in the right lateral decubitus position and prepped and draped in usual sterile fashion.  A timeout was performed.  An 8 mm incision was then made lateral to the left rectus muscle at the level of the left 12th rib.  Abdominal access was obtained via a Veress needle.  The abdominal cavity was then insufflated up to 15 mmHg.  An 8 mm port was then introduced into the abdominal cavity.  Inspection of the port entry site by the robotic camera revealed no adjacent organ injury.  We then placed 3 additional 8 mm robotic ports to triangulate the left renal hilum.  A 12 mm assistant port was then placed between the carmera port and 3rd robotic arm.  The white line of Toldt along the descending colon was incised sharply and the colon, along with its mesocolonic fat, was reflected medially until the aorta was  identified.  We then made a small window adjacent to the lower pole of the left kidney, identifying the left psoas muscle, left ureter and left gonadal vein.  The left ureter and gonadal vein were then reflected anteriorly allowing [place] to then incised the perihilar attachments using electrocautery.  We encountered a small lumbar vein adjacent to the  insertion of the left gonadal vein into the left renal vein.  This lumbar vein was ligated with hemo-lock clips in 2 places and incised sharply.  This provided [place] excellent exposure to the left renal hilum.    A 45 mm endovascular stapler was then used to ligate the left renal artery and then the left renal vein, achieving excellent hemostasis.  The remaining peri-renal attachments were then excised using accommodation of blunt dissection and electrocautery.  The left adrenal gland was spared.  The endovascular stapler was then used to ligate the left gonadal vein.  The left ureter was then traced down to the bladder hiatus, where the most distal possible aspects of the ureter was ligated using a hemo-lock clip.  Once the kidney and ureter was freely mobile, it was placed in Endo Catch bag to be be retrieved at the conclusion of the case.    The robot was then de-docked.  A left lower quadrant Gibson incision was then made and the left kidney and ureter was removed within the Endo Catch bag.  A JP drain was then inserted into the left lower quadrant robotic port and placed into position within the left retroperitoneum.  A drain stitch was placed on the JP drain to secured in place.  The fascia within the midline assistant port was then closed using an interrupted 0 Vicryl suture.  The fascia of the internal oblique was reapproximated using a 0 Vicryl in a running fashion.  The external oblique was then closed using a 0 PDS suture in a running fashion.  The subcutaneous tissue within the [facility] incision was then closed using a running 0 Vicryl suture.  All skin incisions were then closed using 4-0 Monocryl and then dressed with Dermabond.  The patient tolerated the procedure well and was transferred to the postanesthesia in stable condition.     Plan: DC Foley catheter on postop day 1.  Recheck labs in the morning.  SubQ heparin for DVT prophylaxis while in the hospital.  Restart Eliquis the day after  discharge.

## 2018-10-31 NOTE — Progress Notes (Signed)
Patient arrived to room 1404 via stretcher from PACU. Patient is drowsy but easily arousable. VSS. NAD. Bed is in the lowest and locked position with bed rails up times 3. Belongings and call bell within reach. Bed alarm is on.

## 2018-10-31 NOTE — Anesthesia Procedure Notes (Signed)
Procedure Name: Intubation Date/Time: 10/31/2018 1:33 PM Performed by: Aizley Stenseth, Clinical cytogeneticist D, CRNA Pre-anesthesia Checklist: Patient identified, Emergency Drugs available, Suction available and Patient being monitored Patient Re-evaluated:Patient Re-evaluated prior to induction Oxygen Delivery Method: Circle system utilized Preoxygenation: Pre-oxygenation with 100% oxygen Induction Type: IV induction and Rapid sequence Laryngoscope Size: Mac and 4 Grade View: Grade I Tube type: Oral Number of attempts: 1 Airway Equipment and Method: Stylet Placement Confirmation: ETT inserted through vocal cords under direct vision,  positive ETCO2 and breath sounds checked- equal and bilateral Secured at: 22 cm Tube secured with: Tape Dental Injury: Teeth and Oropharynx as per pre-operative assessment

## 2018-11-01 LAB — BASIC METABOLIC PANEL
Anion gap: 8 (ref 5–15)
BUN: 16 mg/dL (ref 8–23)
CO2: 22 mmol/L (ref 22–32)
Calcium: 8.4 mg/dL — ABNORMAL LOW (ref 8.9–10.3)
Chloride: 103 mmol/L (ref 98–111)
Creatinine, Ser: 1.95 mg/dL — ABNORMAL HIGH (ref 0.61–1.24)
GFR calc Af Amer: 40 mL/min — ABNORMAL LOW (ref >60)
GFR calc non Af Amer: 35 mL/min — ABNORMAL LOW (ref >60)
Glucose, Bld: 132 mg/dL — ABNORMAL HIGH (ref 70–99)
Potassium: 4.8 mmol/L (ref 3.5–5.1)
Sodium: 133 mmol/L — ABNORMAL LOW (ref 135–145)

## 2018-11-01 LAB — HEMOGLOBIN AND HEMATOCRIT, BLOOD
HCT: 34.7 % — ABNORMAL LOW (ref 39.0–52.0)
Hemoglobin: 10.4 g/dL — ABNORMAL LOW (ref 13.0–17.0)

## 2018-11-01 NOTE — Progress Notes (Signed)
Urology Progress Note   1 Day Post-Op s/p robotic left nephroureterectomy  Subjective: NAEON. Pain controlled with pain medications. Tolerating CLD. Passing flatus. Ambulating.   Objective: Vital signs in last 24 hours: Temp:  [97.5 F (36.4 C)-98.3 F (36.8 C)] 98.3 F (36.8 C) (05/02 0514) Pulse Rate:  [73-86] 80 (05/02 0514) Resp:  [12-20] 18 (05/02 0514) BP: (149-179)/(77-99) 156/92 (05/02 0514) SpO2:  [95 %-100 %] 97 % (05/02 0758) Weight:  [78.6 kg] 78.6 kg (05/01 1033)  Intake/Output from previous day: 05/01 0701 - 05/02 0700 In: 3390.5 [P.O.:510; I.V.:2880.5] Out: 1680 [Urine:1500; Drains:105; Blood:75] Intake/Output this shift: Total I/O In: -  Out: 625 [Urine:600; Drains:25]  Physical Exam:  General: Alert and oriented CV: HDS, regular rate Lungs: NWOB on RA Abdomen: Soft, appropriately tender. Mildly distended. Incisions c/d/i. GU: Foley in place draining clear yellow urine Ext: NT, No erythema  Lab Results: Recent Labs    10/31/18 1947 11/01/18 0500  HGB 10.7* 10.4*  HCT 35.7* 34.7*   BMET Recent Labs    10/31/18 1947 11/01/18 0500  NA  --  133*  K  --  4.8  CL  --  103  CO2  --  22  GLUCOSE  --  132*  BUN  --  16  CREATININE 1.90* 1.95*  CALCIUM  --  8.4*     Studies/Results: No results found.  Assessment/Plan:  68 y.o. male s/p robotic left nephroureterectomy.  Overall doing well post-op.   - Advance to regular diet. Patient instructed to self-regulate if he develops nausea. - Medlock IVF. - Continue JP drain - will monitor output following foley catheter removal.  - Continue multi-modal pain regimen. - Continue colace. - DVT Prophylaxis: SCDs, early ambulation (at least 3X today), [facility]  - Plan to monitor JP output following foley catheter removal and likely discharge home tomorrow.     LOS: 1 day

## 2018-11-01 NOTE — Progress Notes (Signed)
Upon assessment of pt, this RN drained 40cc, light pink output from JP drain. While attempting to recharge, bulb would not hold suction. Pt up to void and after returning to bed, JP drain came out. On call provider notified and orders received to remove sutures and dress. Pt tolerated well.

## 2018-11-02 LAB — BASIC METABOLIC PANEL
Anion gap: 8 (ref 5–15)
BUN: 20 mg/dL (ref 8–23)
CO2: 24 mmol/L (ref 22–32)
Calcium: 8.7 mg/dL — ABNORMAL LOW (ref 8.9–10.3)
Chloride: 104 mmol/L (ref 98–111)
Creatinine, Ser: 1.91 mg/dL — ABNORMAL HIGH (ref 0.61–1.24)
GFR calc Af Amer: 41 mL/min — ABNORMAL LOW (ref >60)
GFR calc non Af Amer: 35 mL/min — ABNORMAL LOW (ref >60)
Glucose, Bld: 97 mg/dL (ref 70–99)
Potassium: 3.9 mmol/L (ref 3.5–5.1)
Sodium: 136 mmol/L (ref 135–145)

## 2018-11-02 LAB — HEMOGLOBIN AND HEMATOCRIT, BLOOD
HCT: 35.4 % — ABNORMAL LOW (ref 39.0–52.0)
Hemoglobin: 10.8 g/dL — ABNORMAL LOW (ref 13.0–17.0)

## 2018-11-02 NOTE — Anesthesia Postprocedure Evaluation (Signed)
Anesthesia Post Note  Patient: Gregory Christensen  Procedure(s) Performed: XI ROBOT ASSITED LAPAROSCOPIC NEPHROURETERECTOMY (Left ) CYSTOSCOPY/URETEROSCOPY (Left )     Patient location during evaluation: PACU Anesthesia Type: General Level of consciousness: awake and alert Pain management: pain level controlled Vital Signs Assessment: post-procedure vital signs reviewed and stable Respiratory status: spontaneous breathing, nonlabored ventilation, respiratory function stable and patient connected to nasal cannula oxygen Cardiovascular status: blood pressure returned to baseline and stable Postop Assessment: no apparent nausea or vomiting Anesthetic complications: no    Last Vitals:  Vitals:   11/02/18 0627 11/02/18 0826  BP: (!) 177/95   Pulse: 79   Resp:    Temp:    SpO2:  94%    Last Pain:  Vitals:   11/02/18 1020  TempSrc:   PainSc: 0-No pain                 Hillary Schwegler COKER

## 2018-11-02 NOTE — Progress Notes (Signed)
Pt discharged from the unit via wheelchair. Discharge instructions were reviewed with pt at bedside. Post-surgery discharge instructions were reviewed with the pt. No questions or concerns at this time.

## 2018-11-02 NOTE — Discharge Summary (Signed)
[facility] Discharge Summary  Admit date: 10/31/2018  Discharge date and time: 11/02/18   Discharge to: Home  Discharge Service: Urology  Discharge Attending Physician:  Louis Meckel, MD  Discharge  Diagnoses: renal pelvis mass   Secondary Diagnosis: Active Problems:   Renal mass   OR Procedures: Procedure(s): XI ROBOT ASSITED LAPAROSCOPIC NEPHROURETERECTOMY CYSTOSCOPY/URETEROSCOPY 10/31/2018   Ancillary Procedures: None   Discharge Day Services: The patient was seen and examined by the Urology team both in the morning and immediately prior to discharge.  Vital signs and laboratory values were stable and within normal limits.  The physical exam was benign and unchanged and all surgical wounds were examined.  Discharge instructions were explained and all questions answered.  Subjective  No acute events overnight. Pain Controlled. No fever or chills.  Objective Patient Vitals for the past 8 hrs:  BP Temp Temp src Pulse Resp SpO2  11/02/18 0826 - - - - - 94 %  11/02/18 0627 (!) 177/95 - - 79 - -  11/02/18 0425 - 99 F (37.2 C) Oral 83 18 97 %   Total I/O In: 360 [P.O.:360] Out: -   General Appearance:        No acute distress Lungs:                       Normal work of breathing on room air Heart:                                Regular rate and rhythm Abdomen:                         Soft, non-tender, mildly distended Extremities:                      Warm and well perfused   Hospital Course:  The patient underwent cystoscopy, left ureteroscopy, and left robot assisted nephroureterectomy on 10/31/2018.  The patient tolerated the procedure well, was extubated in the OR, and afterwards was taken to the PACU for routine post-surgical care. When stable the patient was transferred to the floor.   The patient did well postoperatively.  The patient's diet was slowly advanced and at the time of discharge was tolerating a regular diet.  The patient was discharged home 2 Days  Post-Op, at which point was tolerating a regular solid diet, was able to void spontaneously, have adequate pain control with P.O. pain medication, and could ambulate without difficulty. The patient will follow up with [place] for post op check. His JP accidentally pulled out prior to discharge, but output in the prior 12 hours (after foley removal) was 40 mL. He was passing flatus at time of discharge. HE was instructed to start his eliquis day after discharge.   Condition at Discharge: Improved  Discharge Medications:  Allergies as of 11/02/2018   No Known Allergies     Medication List    TAKE these medications   acetaminophen 500 MG tablet Commonly known as:  TYLENOL Take 1,000 mg by mouth every 6 (six) hours as needed for moderate pain or headache.   albuterol 108 (90 Base) MCG/ACT inhaler Commonly known as:  VENTOLIN HFA Inhale 2 puffs into the lungs every 2 (two) hours as needed for wheezing or shortness of breath.   albuterol (2.5 MG/3ML) 0.083% nebulizer solution Commonly known as:  PROVENTIL Take 3 mLs (2.5  mg total) by nebulization every 6 (six) hours as needed for wheezing or shortness of breath.   amLODipine 2.5 MG tablet Commonly known as:  NORVASC Take 2.5 mg by mouth daily.   apixaban 5 MG Tabs tablet Commonly known as:  Eliquis Take 1 tablet (5 mg total) by mouth 2 (two) times daily.   budesonide-formoterol 160-4.5 MCG/ACT inhaler Commonly known as:  SYMBICORT Inhale 1 puff into the lungs 2 (two) times daily.   docusate sodium 100 MG capsule Commonly known as:  COLACE Take 1 capsule (100 mg total) by mouth 2 (two) times daily.   fluticasone 50 MCG/ACT nasal spray Commonly known as:  FLONASE Place 1 spray into both nostrils 2 (two) times daily. What changed:    when to take this  reasons to take this   gabapentin 300 MG capsule Commonly known as:  NEURONTIN Take 300 mg by mouth 2 (two) times daily.   HYDROcodone-acetaminophen 5-325 MG tablet Commonly known  as:  Norco Take 1-2 tablets by mouth every 6 (six) hours as needed for moderate pain or severe pain.   ketotifen 0.025 % ophthalmic solution Commonly known as:  ZADITOR Place 1 drop into both eyes 2 (two) times daily as needed (allergies).   loratadine 10 MG tablet Commonly known as:  CLARITIN Take 10 mg by mouth daily.   montelukast 10 MG tablet Commonly known as:  SINGULAIR Take 10 mg by mouth at bedtime.   pantoprazole 40 MG tablet Commonly known as:  PROTONIX Take 40 mg by mouth daily.   promethazine 12.5 MG tablet Commonly known as:  PHENERGAN Take 1 tablet (12.5 mg total) by mouth every 4 (four) hours as needed for nausea or vomiting.   Spiriva Respimat 1.25 MCG/ACT Aers Generic drug:  Tiotropium Bromide Monohydrate Inhale 2 puffs into the lungs daily.

## 2018-11-02 NOTE — Discharge Instructions (Signed)
1.  Activity:  You are encouraged to ambulate frequently (about every hour during waking hours) to help prevent blood clots from forming in your legs or lungs.  However, you should not engage in any heavy lifting (> 10-15 lbs), strenuous activity, or straining. 1. Diet: You should advance your diet as instructed by your physician.  It will be normal to have some bloating, nausea, and abdominal discomfort intermittently. 2. Prescriptions:  You will be provided a prescription for pain medication to take as needed.  If your pain is not severe enough to require the prescription pain medication, you may take extra strength Tylenol instead which will have less side effects.  You should also take a prescribed stool softener to avoid straining with bowel movements as the prescription pain medication may constipate you. DO NOT TAKE MORE THAN 4G of TYLENOL IN 24 hours. 3. Incisions: You may remove your dressing bandages 48 hours after surgery if not removed in the hospital.  You will either have some small staples or special tissue glue at each of the incision sites. Once the bandages are removed (if present), the incisions may stay open to air.  You may start showering (but not soaking or bathing in water) the 2nd day after surgery and the incisions simply need to be patted dry after the shower.  No additional care is needed. 4. What to call us about: You should call the office [phone]) if you develop fever > 101 or develop persistent vomiting.  You may resume aspirin, advil, aleve, vitamins, and supplements 7 days after surgery.  Restart your eliquis on the day after discharge.   Follow up with your primary care provider this week to have your blood pressure checked.

## 2018-11-03 DIAGNOSIS — J441 Chronic obstructive pulmonary disease with (acute) exacerbation: Secondary | ICD-10-CM | POA: Diagnosis not present

## 2018-11-04 LAB — TYPE AND SCREEN
ABO/RH(D): B POS
Antibody Screen: POSITIVE
Donor AG Type: NEGATIVE
Unit division: 0

## 2018-11-04 LAB — BPAM RBC
Blood Product Expiration Date: 202005152359
Unit Type and Rh: 7300

## 2018-11-05 DIAGNOSIS — C652 Malignant neoplasm of left renal pelvis: Secondary | ICD-10-CM | POA: Diagnosis not present

## 2018-11-05 DIAGNOSIS — R8279 Other abnormal findings on microbiological examination of urine: Secondary | ICD-10-CM | POA: Diagnosis not present

## 2018-11-05 DIAGNOSIS — N50812 Left testicular pain: Secondary | ICD-10-CM | POA: Diagnosis not present

## 2018-11-11 ENCOUNTER — Encounter (HOSPITAL_COMMUNITY): Payer: Self-pay | Admitting: Urology

## 2018-11-20 DIAGNOSIS — I10 Essential (primary) hypertension: Secondary | ICD-10-CM | POA: Diagnosis not present

## 2018-11-20 DIAGNOSIS — Z85528 Personal history of other malignant neoplasm of kidney: Secondary | ICD-10-CM | POA: Diagnosis not present

## 2018-11-20 DIAGNOSIS — J441 Chronic obstructive pulmonary disease with (acute) exacerbation: Secondary | ICD-10-CM | POA: Diagnosis not present

## 2018-11-20 DIAGNOSIS — R109 Unspecified abdominal pain: Secondary | ICD-10-CM | POA: Diagnosis not present

## 2018-11-20 DIAGNOSIS — N400 Enlarged prostate without lower urinary tract symptoms: Secondary | ICD-10-CM | POA: Diagnosis not present

## 2018-11-20 DIAGNOSIS — M1990 Unspecified osteoarthritis, unspecified site: Secondary | ICD-10-CM | POA: Diagnosis not present

## 2018-11-20 DIAGNOSIS — J449 Chronic obstructive pulmonary disease, unspecified: Secondary | ICD-10-CM | POA: Diagnosis not present

## 2018-11-20 DIAGNOSIS — I2510 Atherosclerotic heart disease of native coronary artery without angina pectoris: Secondary | ICD-10-CM | POA: Diagnosis not present

## 2018-11-20 DIAGNOSIS — C652 Malignant neoplasm of left renal pelvis: Secondary | ICD-10-CM | POA: Diagnosis not present

## 2018-11-27 DIAGNOSIS — N50812 Left testicular pain: Secondary | ICD-10-CM | POA: Diagnosis not present

## 2018-12-04 DIAGNOSIS — J441 Chronic obstructive pulmonary disease with (acute) exacerbation: Secondary | ICD-10-CM | POA: Diagnosis not present

## 2018-12-09 DIAGNOSIS — Z0000 Encounter for general adult medical examination without abnormal findings: Secondary | ICD-10-CM | POA: Diagnosis not present

## 2018-12-09 DIAGNOSIS — I10 Essential (primary) hypertension: Secondary | ICD-10-CM | POA: Diagnosis not present

## 2018-12-09 DIAGNOSIS — Z23 Encounter for immunization: Secondary | ICD-10-CM | POA: Diagnosis not present

## 2018-12-16 DIAGNOSIS — J449 Chronic obstructive pulmonary disease, unspecified: Secondary | ICD-10-CM | POA: Diagnosis not present

## 2018-12-16 DIAGNOSIS — M1990 Unspecified osteoarthritis, unspecified site: Secondary | ICD-10-CM | POA: Diagnosis not present

## 2018-12-16 DIAGNOSIS — Z23 Encounter for immunization: Secondary | ICD-10-CM | POA: Diagnosis not present

## 2018-12-16 DIAGNOSIS — N183 Chronic kidney disease, stage 3 (moderate): Secondary | ICD-10-CM | POA: Diagnosis not present

## 2018-12-16 DIAGNOSIS — N400 Enlarged prostate without lower urinary tract symptoms: Secondary | ICD-10-CM | POA: Diagnosis not present

## 2018-12-16 DIAGNOSIS — I10 Essential (primary) hypertension: Secondary | ICD-10-CM | POA: Diagnosis not present

## 2018-12-16 DIAGNOSIS — J441 Chronic obstructive pulmonary disease with (acute) exacerbation: Secondary | ICD-10-CM | POA: Diagnosis not present

## 2018-12-16 DIAGNOSIS — Z0000 Encounter for general adult medical examination without abnormal findings: Secondary | ICD-10-CM | POA: Diagnosis not present

## 2018-12-16 DIAGNOSIS — I2510 Atherosclerotic heart disease of native coronary artery without angina pectoris: Secondary | ICD-10-CM | POA: Diagnosis not present

## 2018-12-17 DIAGNOSIS — R310 Gross hematuria: Secondary | ICD-10-CM | POA: Diagnosis not present

## 2018-12-17 DIAGNOSIS — C652 Malignant neoplasm of left renal pelvis: Secondary | ICD-10-CM | POA: Diagnosis not present

## 2018-12-17 DIAGNOSIS — N50812 Left testicular pain: Secondary | ICD-10-CM | POA: Diagnosis not present

## 2018-12-22 ENCOUNTER — Emergency Department (HOSPITAL_COMMUNITY)
Admission: EM | Admit: 2018-12-22 | Discharge: 2018-12-23 | Disposition: A | Discharge status: Home / Self Care | Payer: No Typology Code available for payment source | Attending: Emergency Medicine | Admitting: Emergency Medicine

## 2018-12-22 ENCOUNTER — Emergency Department (HOSPITAL_COMMUNITY): Payer: No Typology Code available for payment source

## 2018-12-22 ENCOUNTER — Encounter (HOSPITAL_COMMUNITY): Payer: Self-pay | Admitting: Family Medicine

## 2018-12-22 ENCOUNTER — Other Ambulatory Visit: Payer: Self-pay

## 2018-12-22 DIAGNOSIS — N451 Epididymitis: Secondary | ICD-10-CM | POA: Insufficient documentation

## 2018-12-22 DIAGNOSIS — E7800 Pure hypercholesterolemia, unspecified: Secondary | ICD-10-CM | POA: Diagnosis not present

## 2018-12-22 DIAGNOSIS — N183 Chronic kidney disease, stage 3 (moderate): Secondary | ICD-10-CM | POA: Diagnosis not present

## 2018-12-22 DIAGNOSIS — Z79899 Other long term (current) drug therapy: Secondary | ICD-10-CM | POA: Diagnosis not present

## 2018-12-22 DIAGNOSIS — Z87891 Personal history of nicotine dependence: Secondary | ICD-10-CM | POA: Insufficient documentation

## 2018-12-22 DIAGNOSIS — Z905 Acquired absence of kidney: Secondary | ICD-10-CM | POA: Diagnosis not present

## 2018-12-22 DIAGNOSIS — J449 Chronic obstructive pulmonary disease, unspecified: Secondary | ICD-10-CM | POA: Diagnosis not present

## 2018-12-22 DIAGNOSIS — I2510 Atherosclerotic heart disease of native coronary artery without angina pectoris: Secondary | ICD-10-CM | POA: Insufficient documentation

## 2018-12-22 DIAGNOSIS — Z7901 Long term (current) use of anticoagulants: Secondary | ICD-10-CM | POA: Insufficient documentation

## 2018-12-22 DIAGNOSIS — R1030 Lower abdominal pain, unspecified: Secondary | ICD-10-CM

## 2018-12-22 DIAGNOSIS — Z85528 Personal history of other malignant neoplasm of kidney: Secondary | ICD-10-CM | POA: Diagnosis not present

## 2018-12-22 DIAGNOSIS — R1032 Left lower quadrant pain: Secondary | ICD-10-CM | POA: Diagnosis present

## 2018-12-22 LAB — CBC WITH DIFFERENTIAL/PLATELET
Abs Immature Granulocytes: 0.04 10*3/uL (ref 0.00–0.07)
Basophils Absolute: 0 10*3/uL (ref 0.0–0.1)
Basophils Relative: 1 %
Eosinophils Absolute: 0.1 10*3/uL (ref 0.0–0.5)
Eosinophils Relative: 2 %
HCT: 35.7 % — ABNORMAL LOW (ref 39.0–52.0)
Hemoglobin: 10.7 g/dL — ABNORMAL LOW (ref 13.0–17.0)
Immature Granulocytes: 1 %
Lymphocytes Relative: 23 %
Lymphs Abs: 1.5 10*3/uL (ref 0.7–4.0)
MCH: 25 pg — ABNORMAL LOW (ref 26.0–34.0)
MCHC: 30 g/dL (ref 30.0–36.0)
MCV: 83.4 fL (ref 80.0–100.0)
Monocytes Absolute: 0.6 10*3/uL (ref 0.1–1.0)
Monocytes Relative: 10 %
Neutro Abs: 4 10*3/uL (ref 1.7–7.7)
Neutrophils Relative %: 63 %
Platelets: 275 10*3/uL (ref 150–400)
RBC: 4.28 MIL/uL (ref 4.22–5.81)
RDW: 15.6 % — ABNORMAL HIGH (ref 11.5–15.5)
WBC: 6.3 10*3/uL (ref 4.0–10.5)
nRBC: 0 % (ref 0.0–0.2)

## 2018-12-22 LAB — URINALYSIS, ROUTINE W REFLEX MICROSCOPIC
Bacteria, UA: NONE SEEN
Bilirubin Urine: NEGATIVE
Glucose, UA: NEGATIVE mg/dL
Ketones, ur: NEGATIVE mg/dL
Leukocytes,Ua: NEGATIVE
Nitrite: NEGATIVE
Protein, ur: NEGATIVE mg/dL
Specific Gravity, Urine: 1.012 (ref 1.005–1.030)
pH: 5 (ref 5.0–8.0)

## 2018-12-22 LAB — COMPREHENSIVE METABOLIC PANEL
ALT: 22 U/L (ref 0–44)
AST: 28 U/L (ref 15–41)
Albumin: 3.7 g/dL (ref 3.5–5.0)
Alkaline Phosphatase: 93 U/L (ref 38–126)
Anion gap: 8 (ref 5–15)
BUN: 16 mg/dL (ref 8–23)
CO2: 20 mmol/L — ABNORMAL LOW (ref 22–32)
Calcium: 9.1 mg/dL (ref 8.9–10.3)
Chloride: 108 mmol/L (ref 98–111)
Creatinine, Ser: 2.11 mg/dL — ABNORMAL HIGH (ref 0.61–1.24)
GFR calc Af Amer: 36 mL/min — ABNORMAL LOW (ref >60)
GFR calc non Af Amer: 31 mL/min — ABNORMAL LOW (ref >60)
Glucose, Bld: 104 mg/dL — ABNORMAL HIGH (ref 70–99)
Potassium: 4.2 mmol/L (ref 3.5–5.1)
Sodium: 136 mmol/L (ref 135–145)
Total Bilirubin: 0.2 mg/dL — ABNORMAL LOW (ref 0.3–1.2)
Total Protein: 7.5 g/dL (ref 6.5–8.1)

## 2018-12-22 LAB — LIPASE, BLOOD: Lipase: 39 U/L (ref 11–51)

## 2018-12-22 MED ORDER — MORPHINE SULFATE (PF) 4 MG/ML IV SOLN
4.0000 mg | Freq: Once | INTRAVENOUS | Status: AC
  Administered 2018-12-22: 4 mg via INTRAVENOUS
  Filled 2018-12-22: qty 1

## 2018-12-22 MED ORDER — IOHEXOL 300 MG/ML  SOLN: 30.0000 mL | Freq: Once | INTRAMUSCULAR | Status: DC | PRN

## 2018-12-22 MED ORDER — ONDANSETRON HCL 4 MG/2ML IJ SOLN
4.0000 mg | Freq: Once | INTRAMUSCULAR | Status: AC
  Administered 2018-12-22: 4 mg via INTRAVENOUS
  Filled 2018-12-22: qty 2

## 2018-12-22 NOTE — ED Notes (Addendum)
Patient given oral contrast by CT

## 2018-12-22 NOTE — ED Notes (Signed)
US at bedside

## 2018-12-22 NOTE — ED Notes (Signed)
Pt aware that urine sample is needed.  

## 2018-12-22 NOTE — ED Triage Notes (Signed)
Patient is complaining of left abd pain that radiates to his left flank and swollen and painful left testicle. 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 Patient reports he is taking Levofloxacin for his swollen testicle with no improvement. Denies fever.

## 2018-12-23 ENCOUNTER — Emergency Department (HOSPITAL_COMMUNITY): Payer: No Typology Code available for payment source

## 2018-12-23 NOTE — ED Provider Notes (Signed)
[facility] DEPT Provider Note   CSN: [id number] Arrival date & time: 12/22/18  2006    History   Chief Complaint Chief Complaint  Patient presents with   Abdominal Pain   Testicle Pain    HPI Gregory Christensen is a 68 y.o. male history of DVT of left lower extremity, aortic atherosclerosis, benign prostatic hyperplasia, PE, renal mass concerning for renal cell carcinoma who presents for evaluation of left abdominal pain and left testicular pain that began about 7 PM this evening.  Patient states he feels like his left testicle is swollen warm.  He has not noticed any overlying erythema. He has had some one going left testicular pain and swelling but feels that it got acutely worse at 7pm this evening. He reports he was recently started on Levaquin for treatment of epydimitis.  He states he also has flank pain on the left side that radiates into the left abdomen.  No dysuria, hematuria.  No difficulty peeing.  He has not had any fevers, nausea/vomiting.  Patient states he is concerned because the abdominal pain is in a similar location where he had his left kidney removed.  Patient states he has had intermittent swelling of his testicle previously but feels like it is worsened today.  He is currently taking levofloxacin for evaluation of epididymitis.  Patient denies any fevers, chest pain, difficulty breathing, nausea/vomiting.  Patient has not noted any penile discharge, penile pain or rash.     The history is provided by the patient.    Past Medical History:  Diagnosis Date   Acute deep vein thrombosis (DVT) of left lower extremity (HCC)    Acute hypoxemic respiratory failure (HCC)    Anxiety    Aortic atherosclerosis (HCC)    Arthritis    Benign prostatic hyperplasia 08/28/2018   UNSPECIFIED WHETHER LOWER URINARY TRACT SYMPTOMS PRESENT   CAD (coronary artery disease)    Chronic back pain    Chronic hip pain    COPD exacerbation (HCC)  10/18/2017   Emphysema lung (HCC)    Essential hypertension    GERD (gastroesophageal reflux disease)    Gout    Gross hematuria 09/08/2018   History of tobacco abuse    Hypoxia    Pulmonary embolism ([place]) 05/31/2018   Renal mass    CONCERNING FOR RENAL CELL CARCINOMA DR. Lovena Neighbours   Respiratory distress    Respiratory failure, acute-on-chronic ([place]) 08/03/2018   Seasonal allergies    Sleep apnea     Patient Active Problem List   Diagnosis Date Noted   Renal mass 10/31/2018   Chest pressure 10/15/2018   Coronary artery calcification 10/15/2018   Respiratory failure, acute-on-chronic ([place]) 08/03/2018   Acute deep vein thrombosis (DVT) of left lower extremity (HCC)    Acute hypoxemic respiratory failure ([place])    Pulmonary embolism ([place]) 05/31/2018   COPD exacerbation ([place]) 10/18/2017   Hypoxia    Respiratory distress    Essential hypertension     Past Surgical History:  Procedure Laterality Date   CYSTOSCOPY/URETEROSCOPY/HOLMIUM LASER/STENT PLACEMENT Left 10/31/2018   Procedure: CYSTOSCOPY/URETEROSCOPY;  Surgeon: Ceasar Mons, MD;  Location: [facility];  Service: Urology;  Laterality: Left;   [name]   EXTENSIVE   ROBOT ASSITED LAPAROSCOPIC NEPHROURETERECTOMY Left 10/31/2018   Procedure: XI ROBOT ASSITED LAPAROSCOPIC NEPHROURETERECTOMY;  Surgeon: Ceasar Mons, MD;  Location: [facility];  Service: Urology;  Laterality: Left;  ONLY NEEDS 240 MIN FOR ALL  PROCEDURES        Home Medications    Prior to Admission medications   Medication Sig Start Date End Date Taking? Authorizing Provider  acetaminophen (TYLENOL) 500 MG tablet Take 1,000 mg by mouth every 6 (six) hours as needed for moderate pain or headache.    Yes [provider]  albuterol (PROVENTIL HFA;VENTOLIN HFA) 108 (90 Base) MCG/ACT inhaler Inhale 2 puffs into the lungs every 2 (two) hours as needed for wheezing or shortness of  breath. 08/04/18  Yes Reyne Dumas, MD  albuterol (PROVENTIL) (2.5 MG/3ML) 0.083% nebulizer solution Take 3 mLs (2.5 mg total) by nebulization every 6 (six) hours as needed for wheezing or shortness of breath. 08/04/18  Yes Reyne Dumas, MD  amLODipine (NORVASC) 2.5 MG tablet Take 2.5 mg by mouth daily.   Yes [provider]  apixaban (ELIQUIS) 5 MG TABS tablet Take 1 tablet (5 mg total) by mouth 2 (two) times daily. 06/10/18  Yes Regalado, Belkys A, MD  budesonide-formoterol (SYMBICORT) 160-4.5 MCG/ACT inhaler Inhale 1 puff into the lungs 2 (two) times daily. 08/04/18  Yes Reyne Dumas, MD  fluticasone (FLONASE) 50 MCG/ACT nasal spray Place 1 spray into both nostrils 2 (two) times daily. Patient taking differently: Place 1 spray into both nostrils 2 (two) times daily as needed for allergies.  08/04/18  Yes Reyne Dumas, MD  gabapentin (NEURONTIN) 300 MG capsule Take 300 mg by mouth 2 (two) times daily.    Yes [provider]  ketotifen (ZADITOR) 0.025 % ophthalmic solution Place 1 drop into both eyes 2 (two) times daily as needed (allergies).   Yes [provider]  levofloxacin (LEVAQUIN) 500 MG tablet Take 500 mg by mouth daily. 12/17/18  Yes [provider]  loratadine (CLARITIN) 10 MG tablet Take 10 mg by mouth daily.   Yes [provider]  montelukast (SINGULAIR) 10 MG tablet Take 10 mg by mouth at bedtime.   Yes [provider]  Tiotropium Bromide Monohydrate (SPIRIVA RESPIMAT) 1.25 MCG/ACT AERS Inhale 2 puffs into the lungs daily.   Yes [provider]  docusate sodium (COLACE) 100 MG capsule Take 1 capsule (100 mg total) by mouth 2 (two) times daily. Patient not taking: Reported on 12/22/2018 10/31/18   Debbrah Alar, PA-C  HYDROcodone-acetaminophen (NORCO) 5-325 MG tablet Take 1-2 tablets by mouth every 6 (six) hours as needed for moderate pain or severe pain. Patient not taking: Reported on 12/22/2018 10/31/18   Debbrah Alar, PA-C   promethazine (PHENERGAN) 12.5 MG tablet Take 1 tablet (12.5 mg total) by mouth every 4 (four) hours as needed for nausea or vomiting. Patient not taking: Reported on 12/22/2018 10/31/18   Debbrah Alar, PA-C    Family History Family History  Problem Relation Age of Onset   Diabetes Mother    Hypertension Sister    Hypertension Brother     Social History Social History   Tobacco Use   Smoking status: Former Smoker    Packs/day: 1.00    Years: 26.00    Pack years: 26.00    Types: Cigarettes    Quit date: 10/18/2017    Years since quitting: 1.1   Smokeless tobacco: Never Used  Substance Use Topics   Alcohol use: Yes    Comment: 2 beers a day    Drug use: Yes    Types: Flunitrazepam     Allergies   Patient has no known allergies.   Review of Systems Review of Systems  Constitutional: Negative for fever.  Respiratory: Negative for cough and shortness of breath.   Cardiovascular: Negative for chest pain.  Gastrointestinal: Positive for abdominal pain. Negative for nausea and vomiting.  Genitourinary: Positive for scrotal swelling and testicular pain. Negative for difficulty urinating, dysuria and hematuria.  Neurological: Negative for headaches.  All other systems reviewed and are negative.    Physical Exam Updated Vital Signs BP (!) 165/98    Pulse 93    Temp 97.9 F (36.6 C) (Oral)    Resp 20    Ht 6\' 1"  (1.854 m)    Wt 76.2 kg    SpO2 100%    BMI 22.18 kg/m   Physical Exam Vitals signs and nursing note reviewed.  Constitutional:      Appearance: Normal appearance. He is well-developed.  HENT:     Head: Normocephalic and atraumatic.  Eyes:     General: Lids are normal.     Conjunctiva/sclera: Conjunctivae normal.     Pupils: Pupils are equal, round, and reactive to light.  Neck:     Musculoskeletal: Full passive range of motion without pain.  Cardiovascular:     Rate and Rhythm: Normal rate and regular rhythm.     Pulses: Normal pulses.           Radial pulses are 2+ on the right side and 2+ on the left side.       Dorsalis pedis pulses are 2+ on the right side and 2+ on the left side.     Heart sounds: Normal heart sounds. No murmur. No friction rub. No gallop.   Pulmonary:     Effort: Pulmonary effort is normal.     Breath sounds: Normal breath sounds.     Comments: Lungs clear to auscultation bilaterally.  Symmetric chest rise.  No wheezing, rales, rhonchi. Abdominal:     Palpations: Abdomen is soft. Abdomen is not rigid.     Tenderness: There is abdominal tenderness in the left lower quadrant. There is left CVA tenderness. There is no guarding.     Hernia: There is no hernia in the left inguinal area or right inguinal area.     Comments: Abdomen soft, nondistended.  Tenderness noted to left lower quadrant as well as left CVA.  No rigidity, guarding.  Genitourinary:    Scrotum/Testes:        Right: Mass, tenderness or swelling not present.        Left: Tenderness and swelling present. Mass not present.     Epididymis:     Right: Normal.     Left: Tenderness present.     Comments: The exam was performed with a chaperone present. Normal male genitalia. No evidence of rash, ulcers or lesions.  Tenderness palpation in left testicle.  Tenderness palpation noted along the epididymis.  No overlying warmth, erythema.  He does appear slightly larger than the right.  No evidence of hernia bilaterally. Musculoskeletal: Normal range of motion.  Skin:    General: Skin is warm and dry.     Capillary Refill: Capillary refill takes less than 2 seconds.  Neurological:     Mental Status: He is alert and oriented to person, place, and time.  Psychiatric:        Speech: Speech normal.      ED Treatments / Results  Labs (all labs ordered are listed, but only abnormal results are displayed) Labs Reviewed  COMPREHENSIVE METABOLIC PANEL - Abnormal; Notable for the following components:      Result Value   CO2  20 (*)    Glucose, Bld 104 (*)     Creatinine, Ser 2.11 (*)    Total Bilirubin 0.2 (*)    GFR calc non Af Amer 31 (*)    GFR calc Af Amer 36 (*)    All other components within normal limits  CBC WITH DIFFERENTIAL/PLATELET - Abnormal; Notable for the following components:   Hemoglobin 10.7 (*)    HCT 35.7 (*)    MCH 25.0 (*)    RDW 15.6 (*)    All other components within normal limits  URINALYSIS, ROUTINE W REFLEX MICROSCOPIC - Abnormal; Notable for the following components:   Hgb urine dipstick SMALL (*)    All other components within normal limits  LIPASE, BLOOD    EKG None  Radiology US Scrotum W/doppler  Result Date: 12/22/2018 CLINICAL DATA:  Initial evaluation for acute testicular pain. EXAM: SCROTAL ULTRASOUND DOPPLER ULTRASOUND OF THE TESTICLES TECHNIQUE: Complete ultrasound examination of the testicles, epididymis, and other scrotal structures was performed. Color and spectral Doppler ultrasound were also utilized to evaluate blood flow to the testicles. COMPARISON:  None available. FINDINGS: Right testicle Measurements: 3.6 x 2.5 x 2.6 cm. No mass or microlithiasis visualized. Left testicle Measurements: 3.4 x 2.2 x 2.2 cm. No microlithiasis. 3-4 mm simple cyst noted, likely a small benign tunic albuginea cyst. No other concerning mass. Right epididymis:  Normal in size and appearance. Left epididymis: Left epididymis is heterogeneous in appearance with increased vascularity, suggesting acute epididymitis. Hydrocele:  None visualized. Varicocele:  None visualized. Pulsed Doppler interrogation of both testes demonstrates normal low resistance arterial and venous waveforms bilaterally. IMPRESSION: 1. Heterogeneous and hypervascular left epididymis, suggesting acute epididymitis. 2. Otherwise negative scrotal ultrasound. No evidence for torsion or other acute finding. Electronically Signed   By: Jeannine Boga M.D.   On: 12/22/2018 22:27    Procedures Procedures (including critical care time)  Medications  Ordered in ED Medications  iohexol (OMNIPAQUE) 300 MG/ML solution 30 mL (has no administration in time range)  morphine 4 MG/ML injection 4 mg (4 mg Intravenous Given 12/22/18 2148)  ondansetron (ZOFRAN) injection 4 mg (4 mg Intravenous Given 12/22/18 2147)     Initial Impression / Assessment and Plan / ED Course  I have reviewed the triage vital signs and the nursing notes.  Pertinent labs & imaging results that were available during my care of the patient were reviewed by me and considered in my medical decision making (see chart for details).        68 year old male past medical history of DVT of left lower extremity (on Eliquis), aortic atherosclerosis, benign prostatic hyperplasia, PE, renal mass concerning for renal cell carcinoma who presents for evaluation of left lower quadrant abdominal pain, left flank pain as well as of testicular pain and swelling.  He does report that the testicular pain and swelling has been an ongoing issue but he feels like it worsened tonight at 7 PM along with some left flank and left abdominal pain.  No fevers, nausea/vomiting, or urinary complaints.  He is currently on Levaquin for treatment of epididymitis.  He states he has been compliant with it.  History of radical left nephroureterectomy for concerns of renal cancer. Patient is afebrile, non-toxic appearing, sitting comfortably on examination table. Vital signs reviewed and stable.  On exam, he has tenderness palpation of left lower quadrant as well as the left testicle.  There is some mild overlying edema but no warmth or erythema noted.  He does have tenderness  overlying the epididymis.  Plan for labs, ultrasound, CT.  Review of records show that he was seen by Dr. Lovena Neighbours (Urology) 02/19/2019.  At that time, he had a large enhancing soft tissue lesion in the left renal that was concerning for evaluation of renal carcinoma.  Additionally, he had evidence of obstructive uropathy.  Urology recommended radical  left nephroureterectomy.   Lipase unremarkable.  CMP shows bicarb of 20, creatinine of 2.11.  BUN is unremarkable.  Review of records show that his baseline creatinine is between 1.9.  UA negative for any infectious etiology.  There is small amount of hemoglobin.  CBC shows no leukocytosis.  Hemoglobin stable 10.7.  Ultrasound shows left epididymitis otherwise no acute abnormalities.  Discussed results with patient. He reports improvement in pain.  Repeat abdominal exam is improved.  I did discuss with patient regarding epididymitis.  He states that he is currently being treated for Levaquin which was started on by his urologist.  He states he has been compliant with medication.  No indication to add to that treatment.  Instructed patient to finish Levaquin course as directed.  Given abdominal pain, will plan for CT abdomen pelvis. Discussed patient with Dr. Tomi Bamberger.  CT abdomen pelvis negative for any acute abnormalities, it is reasonable for patient be discharged home with plans to follow-up with urology.  Patient signed out to Charlann Lange, PA-C pending CT abd/pelvis.  If no acute or laterally seen.  Plan to discharge home with follow-up with urology.  He is already being treated for epididymitis with Levaquin.  No indication to change antibiotic therapy.   Portions of this note were generated with [employer]. Dictation errors may occur despite best attempts at proofreading.  Final Clinical Impressions(s) / ED Diagnoses   Final diagnoses:  Epididymitis  Lower abdominal pain    ED Discharge Orders    None       Desma Mcgregor 12/23/18 0235    Dorie Rank, MD 12/24/18 2023

## 2018-12-23 NOTE — ED Notes (Signed)
Pt verbalized discharge instructions and follow up care. Alert and ambulatory. No IV.  

## 2018-12-23 NOTE — ED Provider Notes (Signed)
H/o renal CA s/p nephrectomy, uretectomy Left testicular swelling, on Levaquin 7;00 tonight LLQ abdominal pain No fever, N, V, CP, SOB, urinary symptoms [place] - negative for torsion Cr baseline 1.89 now 2.1  Pending CT for diverticular dz, other  Re-evaluation: patient is sleeping and appears comfortable. CT results discussed. No acute finding in the abdomen or pelvis. VSS. He is considered appropriate for discharge home. He has been instructed to continue Levaquin but to follow up with urology as planned for treatment of continued epididymitis.     Charlann Lange, PA-C 12/23/18 Gay Filler, MD 12/24/18 2023

## 2018-12-23 NOTE — ED Notes (Signed)
Pt back from radiology 

## 2018-12-23 NOTE — Discharge Instructions (Addendum)
Continue taking antibiotic as directed.  Follow-up with the referred urologist.  Call their office and arrange for appointment.  Return the emergency department for any worsening pain, redness or swelling of the testicle, difficulty urinating, fever, nausea/vomiting or any other worsening concerning symptoms.

## 2018-12-25 DIAGNOSIS — N50812 Left testicular pain: Secondary | ICD-10-CM | POA: Diagnosis not present

## 2019-01-03 DIAGNOSIS — J441 Chronic obstructive pulmonary disease with (acute) exacerbation: Secondary | ICD-10-CM | POA: Diagnosis not present

## 2019-01-19 DIAGNOSIS — N183 Chronic kidney disease, stage 3 (moderate): Secondary | ICD-10-CM | POA: Diagnosis not present

## 2019-01-26 DIAGNOSIS — J441 Chronic obstructive pulmonary disease with (acute) exacerbation: Secondary | ICD-10-CM | POA: Diagnosis not present

## 2019-01-26 DIAGNOSIS — N183 Chronic kidney disease, stage 3 (moderate): Secondary | ICD-10-CM | POA: Diagnosis not present

## 2019-01-26 DIAGNOSIS — I2510 Atherosclerotic heart disease of native coronary artery without angina pectoris: Secondary | ICD-10-CM | POA: Diagnosis not present

## 2019-01-26 DIAGNOSIS — N400 Enlarged prostate without lower urinary tract symptoms: Secondary | ICD-10-CM | POA: Diagnosis not present

## 2019-01-26 DIAGNOSIS — I10 Essential (primary) hypertension: Secondary | ICD-10-CM | POA: Diagnosis not present

## 2019-01-26 DIAGNOSIS — M1990 Unspecified osteoarthritis, unspecified site: Secondary | ICD-10-CM | POA: Diagnosis not present

## 2019-01-26 DIAGNOSIS — J449 Chronic obstructive pulmonary disease, unspecified: Secondary | ICD-10-CM | POA: Diagnosis not present

## 2019-01-28 DIAGNOSIS — N50812 Left testicular pain: Secondary | ICD-10-CM | POA: Diagnosis not present

## 2019-01-28 DIAGNOSIS — R8279 Other abnormal findings on microbiological examination of urine: Secondary | ICD-10-CM | POA: Diagnosis not present

## 2019-02-03 DIAGNOSIS — J441 Chronic obstructive pulmonary disease with (acute) exacerbation: Secondary | ICD-10-CM | POA: Diagnosis not present

## 2019-02-04 DIAGNOSIS — I2510 Atherosclerotic heart disease of native coronary artery without angina pectoris: Secondary | ICD-10-CM | POA: Diagnosis not present

## 2019-02-04 DIAGNOSIS — N183 Chronic kidney disease, stage 3 (moderate): Secondary | ICD-10-CM | POA: Diagnosis not present

## 2019-02-04 DIAGNOSIS — J441 Chronic obstructive pulmonary disease with (acute) exacerbation: Secondary | ICD-10-CM | POA: Diagnosis not present

## 2019-02-04 DIAGNOSIS — M1990 Unspecified osteoarthritis, unspecified site: Secondary | ICD-10-CM | POA: Diagnosis not present

## 2019-02-04 DIAGNOSIS — J449 Chronic obstructive pulmonary disease, unspecified: Secondary | ICD-10-CM | POA: Diagnosis not present

## 2019-02-04 DIAGNOSIS — I10 Essential (primary) hypertension: Secondary | ICD-10-CM | POA: Diagnosis not present

## 2019-02-04 DIAGNOSIS — N400 Enlarged prostate without lower urinary tract symptoms: Secondary | ICD-10-CM | POA: Diagnosis not present

## 2019-02-11 DIAGNOSIS — N50812 Left testicular pain: Secondary | ICD-10-CM | POA: Diagnosis not present

## 2019-02-11 DIAGNOSIS — C652 Malignant neoplasm of left renal pelvis: Secondary | ICD-10-CM | POA: Diagnosis not present

## 2019-02-18 DIAGNOSIS — N453 Epididymo-orchitis: Secondary | ICD-10-CM | POA: Diagnosis not present

## 2019-02-18 DIAGNOSIS — N50812 Left testicular pain: Secondary | ICD-10-CM | POA: Diagnosis not present

## 2019-02-19 ENCOUNTER — Encounter (HOSPITAL_COMMUNITY): Payer: Self-pay | Admitting: Emergency Medicine

## 2019-02-19 ENCOUNTER — Emergency Department (HOSPITAL_COMMUNITY): Payer: No Typology Code available for payment source

## 2019-02-19 ENCOUNTER — Other Ambulatory Visit: Payer: Self-pay

## 2019-02-19 ENCOUNTER — Emergency Department (HOSPITAL_COMMUNITY)
Admission: EM | Admit: 2019-02-19 | Discharge: 2019-02-19 | Disposition: A | Discharge status: Home / Self Care | Payer: No Typology Code available for payment source | Attending: Emergency Medicine | Admitting: Emergency Medicine

## 2019-02-19 DIAGNOSIS — J449 Chronic obstructive pulmonary disease, unspecified: Secondary | ICD-10-CM | POA: Diagnosis not present

## 2019-02-19 DIAGNOSIS — I2510 Atherosclerotic heart disease of native coronary artery without angina pectoris: Secondary | ICD-10-CM | POA: Diagnosis not present

## 2019-02-19 DIAGNOSIS — Z7901 Long term (current) use of anticoagulants: Secondary | ICD-10-CM | POA: Diagnosis not present

## 2019-02-19 DIAGNOSIS — R1031 Right lower quadrant pain: Secondary | ICD-10-CM | POA: Diagnosis present

## 2019-02-19 DIAGNOSIS — N451 Epididymitis: Secondary | ICD-10-CM | POA: Diagnosis not present

## 2019-02-19 DIAGNOSIS — Z79899 Other long term (current) drug therapy: Secondary | ICD-10-CM | POA: Diagnosis not present

## 2019-02-19 DIAGNOSIS — Z87891 Personal history of nicotine dependence: Secondary | ICD-10-CM | POA: Diagnosis not present

## 2019-02-19 DIAGNOSIS — I10 Essential (primary) hypertension: Secondary | ICD-10-CM | POA: Insufficient documentation

## 2019-02-19 LAB — CBC WITH DIFFERENTIAL/PLATELET
Abs Immature Granulocytes: 0.04 10*3/uL (ref 0.00–0.07)
Basophils Absolute: 0 10*3/uL (ref 0.0–0.1)
Basophils Relative: 0 %
Eosinophils Absolute: 0.1 10*3/uL (ref 0.0–0.5)
Eosinophils Relative: 1 %
HCT: 40 % (ref 39.0–52.0)
Hemoglobin: 11.4 g/dL — ABNORMAL LOW (ref 13.0–17.0)
Immature Granulocytes: 1 %
Lymphocytes Relative: 9 %
Lymphs Abs: 0.8 10*3/uL (ref 0.7–4.0)
MCH: 23.4 pg — ABNORMAL LOW (ref 26.0–34.0)
MCHC: 28.5 g/dL — ABNORMAL LOW (ref 30.0–36.0)
MCV: 82 fL (ref 80.0–100.0)
Monocytes Absolute: 0.9 10*3/uL (ref 0.1–1.0)
Monocytes Relative: 11 %
Neutro Abs: 6.5 10*3/uL (ref 1.7–7.7)
Neutrophils Relative %: 78 %
Platelets: 203 10*3/uL (ref 150–400)
RBC: 4.88 MIL/uL (ref 4.22–5.81)
RDW: 16.4 % — ABNORMAL HIGH (ref 11.5–15.5)
WBC: 8.3 10*3/uL (ref 4.0–10.5)
nRBC: 0 % (ref 0.0–0.2)

## 2019-02-19 LAB — COMPREHENSIVE METABOLIC PANEL
ALT: 24 U/L (ref 0–44)
AST: 22 U/L (ref 15–41)
Albumin: 4.3 g/dL (ref 3.5–5.0)
Alkaline Phosphatase: 124 U/L (ref 38–126)
Anion gap: 12 (ref 5–15)
BUN: 19 mg/dL (ref 8–23)
CO2: 22 mmol/L (ref 22–32)
Calcium: 9.4 mg/dL (ref 8.9–10.3)
Chloride: 102 mmol/L (ref 98–111)
Creatinine, Ser: 2.25 mg/dL — ABNORMAL HIGH (ref 0.61–1.24)
GFR calc Af Amer: 34 mL/min — ABNORMAL LOW (ref >60)
GFR calc non Af Amer: 29 mL/min — ABNORMAL LOW (ref >60)
Glucose, Bld: 107 mg/dL — ABNORMAL HIGH (ref 70–99)
Potassium: 4.4 mmol/L (ref 3.5–5.1)
Sodium: 136 mmol/L (ref 135–145)
Total Bilirubin: 0.6 mg/dL (ref 0.3–1.2)
Total Protein: 8.7 g/dL — ABNORMAL HIGH (ref 6.5–8.1)

## 2019-02-19 LAB — URINALYSIS, ROUTINE W REFLEX MICROSCOPIC
Bilirubin Urine: NEGATIVE
Glucose, UA: NEGATIVE mg/dL
Hgb urine dipstick: NEGATIVE
Ketones, ur: NEGATIVE mg/dL
Leukocytes,Ua: NEGATIVE
Nitrite: NEGATIVE
Protein, ur: NEGATIVE mg/dL
Specific Gravity, Urine: 1.025 (ref 1.005–1.030)
pH: 5 (ref 5.0–8.0)

## 2019-02-19 LAB — LIPASE, BLOOD: Lipase: 26 U/L (ref 11–51)

## 2019-02-19 MED ORDER — MORPHINE SULFATE (PF) 4 MG/ML IV SOLN
4.0000 mg | Freq: Once | INTRAVENOUS | Status: AC
  Administered 2019-02-19: 4 mg via INTRAVENOUS
  Filled 2019-02-19: qty 1

## 2019-02-19 MED ORDER — ONDANSETRON HCL 4 MG/2ML IJ SOLN
4.0000 mg | Freq: Once | INTRAMUSCULAR | Status: AC
  Administered 2019-02-19: 4 mg via INTRAVENOUS
  Filled 2019-02-19: qty 2

## 2019-02-19 MED ORDER — DOXYCYCLINE HYCLATE 100 MG PO TABS
100.0000 mg | ORAL_TABLET | Freq: Once | ORAL | Status: AC
  Administered 2019-02-19: 100 mg via ORAL
  Filled 2019-02-19: qty 1

## 2019-02-19 MED ORDER — LIDOCAINE HCL (PF) 1 % IJ SOLN
INTRAMUSCULAR | Status: AC
  Administered 2019-02-19: 30 mL
  Filled 2019-02-19: qty 30

## 2019-02-19 MED ORDER — SODIUM CHLORIDE 0.9 % IV BOLUS
1000.0000 mL | Freq: Once | INTRAVENOUS | Status: AC
  Administered 2019-02-19: 18:00:00 1000 mL via INTRAVENOUS

## 2019-02-19 MED ORDER — DOXYCYCLINE HYCLATE 100 MG PO CAPS: 100.0000 mg | ORAL_CAPSULE | Freq: Two times a day (BID) | ORAL | 0 refills | Status: DC

## 2019-02-19 MED ORDER — HYDROCODONE-ACETAMINOPHEN 5-325 MG PO TABS: 1.0000 | ORAL_TABLET | ORAL | 0 refills | Status: DC | PRN

## 2019-02-19 MED ORDER — MORPHINE SULFATE (PF) 4 MG/ML IV SOLN
4.0000 mg | Freq: Once | INTRAVENOUS | Status: AC
  Administered 2019-02-19: 20:00:00 4 mg via INTRAVENOUS
  Filled 2019-02-19: qty 1

## 2019-02-19 MED ORDER — CEFTRIAXONE SODIUM 250 MG IJ SOLR
250.0000 mg | Freq: Once | INTRAMUSCULAR | Status: AC
  Administered 2019-02-19: 20:00:00 250 mg via INTRAMUSCULAR
  Filled 2019-02-19: qty 250

## 2019-02-19 NOTE — ED Triage Notes (Signed)
Pt reports still having left scrotum and left flank pains since surgery in May to have kidney removed. Reports was seen at Urology yesterday and started on new antibiotics.

## 2019-02-19 NOTE — ED Provider Notes (Signed)
[facility] DEPT Provider Note   CSN: [id number] Arrival date & time: 02/19/19  1600     History   Chief Complaint Chief Complaint  Patient presents with  . Testicle Pain  . Flank Pain    HPI Gregory Christensen is a 68 y.o. male.     Pt presents to the ED today with left lower abdominal pain and left testicular pain.  The pt said it's been hurting him for a few days.  He did go to urology yesterday and was given abx (Levaquin).  He does not know what infection he had.  He denies f/c.  He has had a prior left sided nephrectomy for RCC in May of 2020.  He said he's had pain on and off since then.     Past Medical History:  Diagnosis Date  . Acute deep vein thrombosis (DVT) of left lower extremity ([place])   . Acute hypoxemic respiratory failure ([place])   . Anxiety   . Aortic atherosclerosis ([place])   . Arthritis   . Benign prostatic hyperplasia 08/28/2018   UNSPECIFIED WHETHER LOWER URINARY TRACT SYMPTOMS PRESENT  . CAD (coronary artery disease)   . Chronic back pain   . Chronic hip pain   . COPD exacerbation ([place]) 10/18/2017  . Emphysema lung ([place])   . Essential hypertension   . GERD (gastroesophageal reflux disease)   . Gout   . Gross hematuria 09/08/2018  . History of tobacco abuse   . Hypoxia   . Pulmonary embolism ([place]) 05/31/2018  . Renal mass    CONCERNING FOR RENAL CELL CARCINOMA DR. Lovena Neighbours  . Respiratory distress   . Respiratory failure, acute-on-chronic ([place]) 08/03/2018  . Seasonal allergies   . Sleep apnea     Patient Active Problem List   Diagnosis Date Noted  . Renal mass 10/31/2018  . Chest pressure 10/15/2018  . Coronary artery calcification 10/15/2018  . Respiratory failure, acute-on-chronic ([place]) 08/03/2018  . Acute deep vein thrombosis (DVT) of left lower extremity ([place])   . Acute hypoxemic respiratory failure ([place])   . Pulmonary embolism ([place]) 05/31/2018  . COPD exacerbation ([place]) 10/18/2017  . Hypoxia   . Respiratory  distress   . Essential hypertension     Past Surgical History:  Procedure Laterality Date  . CYSTOSCOPY/URETEROSCOPY/HOLMIUM LASER/STENT PLACEMENT Left 10/31/2018   Procedure: CYSTOSCOPY/URETEROSCOPY;  Surgeon: Ceasar Mons, MD;  Location: [facility];  Service: Urology;  Laterality: Left;  . HERNIA REPAIR  1977  . MANDIBLE SURGERY  1976   EXTENSIVE  . ROBOT ASSITED LAPAROSCOPIC NEPHROURETERECTOMY Left 10/31/2018   Procedure: XI ROBOT ASSITED LAPAROSCOPIC NEPHROURETERECTOMY;  Surgeon: Ceasar Mons, MD;  Location: [facility];  Service: Urology;  Laterality: Left;  ONLY NEEDS 240 MIN FOR ALL PROCEDURES        Home Medications    Prior to Admission medications   Medication Sig Start Date End Date Taking? Authorizing Provider  acetaminophen (TYLENOL) 500 MG tablet Take 1,000 mg by mouth every 6 (six) hours as needed for moderate pain or headache.    Yes [provider]  albuterol (PROVENTIL HFA;VENTOLIN HFA) 108 (90 Base) MCG/ACT inhaler Inhale 2 puffs into the lungs every 2 (two) hours as needed for wheezing or shortness of breath. 08/04/18  Yes Reyne Dumas, MD  albuterol (PROVENTIL) (2.5 MG/3ML) 0.083% nebulizer solution Take 3 mLs (2.5 mg total) by nebulization every 6 (six) hours as needed for wheezing or shortness of breath. 08/04/18  Yes Reyne Dumas, MD  amLODipine (  NORVASC) 2.5 MG tablet Take 2.5 mg by mouth daily.   Yes [provider]  apixaban (ELIQUIS) 5 MG TABS tablet Take 1 tablet (5 mg total) by mouth 2 (two) times daily. 06/10/18  Yes Regalado, Belkys A, MD  budesonide-formoterol (SYMBICORT) 160-4.5 MCG/ACT inhaler Inhale 1 puff into the lungs 2 (two) times daily. 08/04/18  Yes Reyne Dumas, MD  fluticasone (FLONASE) 50 MCG/ACT nasal spray Place 1 spray into both nostrils 2 (two) times daily. Patient taking differently: Place 1 spray into both nostrils 2 (two) times daily as needed for allergies.  08/04/18  Yes Reyne Dumas, MD  gabapentin  (NEURONTIN) 300 MG capsule Take 300 mg by mouth 2 (two) times daily.    Yes [provider]  ketotifen (ZADITOR) 0.025 % ophthalmic solution Place 1 drop into both eyes 2 (two) times daily as needed (allergies).   Yes [provider]  loratadine (CLARITIN) 10 MG tablet Take 10 mg by mouth daily.   Yes [provider]  montelukast (SINGULAIR) 10 MG tablet Take 10 mg by mouth at bedtime.   Yes [provider]  Tiotropium Bromide Monohydrate (SPIRIVA RESPIMAT) 1.25 MCG/ACT AERS Inhale 2 puffs into the lungs daily.   Yes [provider]  docusate sodium (COLACE) 100 MG capsule Take 1 capsule (100 mg total) by mouth 2 (two) times daily. Patient not taking: Reported on 12/22/2018 10/31/18   Debbrah Alar, PA-C  HYDROcodone-acetaminophen (NORCO) 5-325 MG tablet Take 1-2 tablets by mouth every 6 (six) hours as needed for moderate pain or severe pain. Patient not taking: Reported on 12/22/2018 10/31/18   Debbrah Alar, PA-C  promethazine (PHENERGAN) 12.5 MG tablet Take 1 tablet (12.5 mg total) by mouth every 4 (four) hours as needed for nausea or vomiting. Patient not taking: Reported on 12/22/2018 10/31/18   Debbrah Alar, PA-C    Family History Family History  Problem Relation Age of Onset  . Diabetes Mother   . Hypertension Sister   . Hypertension Brother     Social History Social History   Tobacco Use  . Smoking status: Former Smoker    Packs/day: 1.00    Years: 26.00    Pack years: 26.00    Types: Cigarettes    Quit date: 10/18/2017    Years since quitting: 1.3  . Smokeless tobacco: Never Used  Substance Use Topics  . Alcohol use: Yes    Comment: 2 beers a day   . Drug use: Yes    Types: Flunitrazepam     Allergies   Patient has no known allergies.   Review of Systems Review of Systems  Gastrointestinal: Positive for abdominal pain.  All other systems reviewed and are negative.    Physical Exam Updated Vital Signs BP 135/86 (BP  Location: Right Arm)   Pulse 87   Temp 98.9 F (37.2 C)   Resp 19   Ht 6\' 1"  (1.854 m)   Wt 77.6 kg   SpO2 100%   BMI 22.56 kg/m   Physical Exam Vitals signs and nursing note reviewed.  Constitutional:      Appearance: Normal appearance.  HENT:     Head: Normocephalic and atraumatic.     Right Ear: External ear normal.     Left Ear: External ear normal.     Nose: Nose normal.     Mouth/Throat:     Mouth: Mucous membranes are dry.  Eyes:     Extraocular Movements: Extraocular movements intact.     Conjunctiva/sclera: Conjunctivae normal.  Pupils: Pupils are equal, round, and reactive to light.  Neck:     Musculoskeletal: Normal range of motion and neck supple.  Cardiovascular:     Rate and Rhythm: Normal rate and regular rhythm.     Pulses: Normal pulses.     Heart sounds: Normal heart sounds.  Pulmonary:     Effort: Pulmonary effort is normal.     Breath sounds: Normal breath sounds.  Abdominal:     Tenderness: There is abdominal tenderness in the left lower quadrant.  Genitourinary:    Comments: Left testicular pain.  No swelling or abnormal lie. Musculoskeletal: Normal range of motion.  Skin:    General: Skin is warm.     Capillary Refill: Capillary refill takes less than 2 seconds.  Neurological:     General: No focal deficit present.     Mental Status: He is alert and oriented to person, place, and time.  Psychiatric:        Mood and Affect: Mood normal.        Behavior: Behavior normal.      ED Treatments / Results  Labs (all labs ordered are listed, but only abnormal results are displayed) Labs Reviewed  CBC WITH DIFFERENTIAL/PLATELET - Abnormal; Notable for the following components:      Result Value   Hemoglobin 11.4 (*)    MCH 23.4 (*)    MCHC 28.5 (*)    RDW 16.4 (*)    All other components within normal limits  COMPREHENSIVE METABOLIC PANEL - Abnormal; Notable for the following components:   Glucose, Bld 107 (*)    Creatinine, Ser 2.25  (*)    Total Protein 8.7 (*)    GFR calc non Af Amer 29 (*)    GFR calc Af Amer 34 (*)    All other components within normal limits  URINE CULTURE  URINALYSIS, ROUTINE W REFLEX MICROSCOPIC  LIPASE, BLOOD    EKG None  Radiology Ct Abdomen Pelvis Wo Contrast  Result Date: 02/19/2019 CLINICAL DATA:  Abdominal pain history of left nephrectomy EXAM: CT ABDOMEN AND PELVIS WITHOUT CONTRAST TECHNIQUE: Multidetector CT imaging of the abdomen and pelvis was performed following the standard protocol without IV contrast. COMPARISON:  CT 12/23/2018 FINDINGS: Lower chest: Lung bases demonstrate no acute consolidation or effusion. Emphysematous disease with mild fibrosis at the right base. Normal heart size. Hepatobiliary: No focal liver abnormality is seen. No gallstones, gallbladder wall thickening, or biliary dilatation. Pancreas: Unremarkable. No pancreatic ductal dilatation or surrounding inflammatory changes. Spleen: Normal in size without focal abnormality. Adrenals/Urinary Tract: Right adrenal gland and kidney are normal. Patient is status post left nephrectomy. The urinary bladder is unremarkable Stomach/Bowel: Stomach is within normal limits. Appendix appears normal. No evidence of bowel wall thickening, distention, or inflammatory changes. Vascular/Lymphatic: Extensive aortic atherosclerosis without aneurysm. No significantly enlarged lymph nodes. Reproductive: Enlarged prostate with mass effect on the bladder. Skin thickening left scrotum with partially visible hydrocele. Other: Negative for free air or free fluid. Musculoskeletal: No acute or suspicious osseous abnormality. IMPRESSION: 1. Status post left nephrectomy. No CT evidence for acute intra-abdominal or pelvic abnormality 2. Skin thickening of the left scrotum with partially visible hydrocele 3. Enlarged prostate 4. Emphysematous disease Electronically Signed   By: Donavan Foil M.D.   On: 02/19/2019 19:30   US Scrotum W/doppler  Result  Date: 02/19/2019 CLINICAL DATA:  Left testicle pain since June EXAM: SCROTAL ULTRASOUND DOPPLER ULTRASOUND OF THE TESTICLES TECHNIQUE: Complete ultrasound examination of the testicles, epididymis, and  other scrotal structures was performed. Color and spectral Doppler ultrasound were also utilized to evaluate blood flow to the testicles. COMPARISON:  Ultrasound 12/25/2018, 12/22/2018 FINDINGS: Right testicle Measurements: 3.9 x 1.9 x 2.2 cm. No mass or microlithiasis visualized. Left testicle Measurements:  3.2 x 1.9 x 2.1 cm.  Small cyst measuring 4.4 mm. Right epididymis:  Normal in size and appearance. Left epididymis: Enlarged heterogeneous left epididymis with hyperemia. Hydrocele:  Small left hydrocele. Varicocele:  None visualized. Pulsed Doppler interrogation of both testes demonstrates normal low resistance arterial and venous waveforms bilaterally. IMPRESSION: 1. Negative for acute testicular torsion 2. Enlarged heterogeneous and hyperemic left epididymis suggesting epididymitis,. Slightly worsened as compared with scrotal ultrasound from June 2020. No abscess. Small left hydrocele containing scattered particular matter. Electronically Signed   By: Donavan Foil M.D.   On: 02/19/2019 19:18    Procedures Procedures (including critical care time)  Medications Ordered in ED Medications  cefTRIAXone (ROCEPHIN) injection 250 mg (has no administration in time range)  doxycycline (VIBRA-TABS) tablet 100 mg (has no administration in time range)  morphine 4 MG/ML injection 4 mg (has no administration in time range)  sodium chloride 0.9 % bolus 1,000 mL (1,000 mLs Intravenous New Bag/Given 02/19/19 1829)  morphine 4 MG/ML injection 4 mg (4 mg Intravenous Given 02/19/19 1831)  ondansetron (ZOFRAN) injection 4 mg (4 mg Intravenous Given 02/19/19 1830)     Initial Impression / Assessment and Plan / ED Course  I have reviewed the triage vital signs and the nursing notes.  Pertinent labs & imaging results  that were available during my care of the patient were reviewed by me and considered in my medical decision making (see chart for details).  US shows evidence of epididymitis which he had a few weeks ago.  He was put on levaquin then and said that did not help.  I am going to give him rocephin/doxy.  He is instructed to return if worse.  F/u with urology.  Final Clinical Impressions(s) / ED Diagnoses   Final diagnoses:  Epididymitis    ED Discharge Orders    None       Isla Pence, MD 02/19/19 [phone]

## 2019-02-19 NOTE — ED Notes (Signed)
Pt was verbalized discharge instructions. Pt had no further questions at this time. NAD. 

## 2019-02-19 NOTE — Discharge Instructions (Signed)
Stop Levaquin

## 2019-02-20 ENCOUNTER — Telehealth: Payer: Self-pay | Admitting: Nurse Practitioner

## 2019-02-20 LAB — URINE CULTURE: Culture: <10000 — AB

## 2019-02-20 NOTE — Telephone Encounter (Signed)
YOUR CARDIOLOGY TEAM HAS ARRANGED FOR AN E-VISIT FOR YOUR APPOINTMENT - PLEASE REVIEW IMPORTANT INFORMATION BELOW SEVERAL DAYS PRIOR TO YOUR APPOINTMENT  Due to the recent COVID-19 pandemic, we are transitioning in-person office visits to tele-medicine visits in an effort to decrease unnecessary exposure to our patients, their families, and staff. These visits are billed to your insurance just like a normal visit is. We also encourage you to sign up for MyChart if you have not already done so. You will need a smartphone if possible. For patients that do not have this, we can still complete the visit using a regular telephone but do prefer a smartphone to enable video when possible. You may have a family member that lives with you that can help. If possible, we also ask that you have a blood pressure cuff and scale at home to measure your blood pressure, heart rate and weight prior to your scheduled appointment. Patients with clinical needs that need an in-person evaluation and testing will still be able to come to the office if absolutely necessary. If you have any questions, feel free to call our office.     YOUR PROVIDER WILL BE USING THE FOLLOWING PLATFORM TO COMPLETE YOUR VISIT: Telephone   2-3 DAYS BEFORE YOUR APPOINTMENT  You will receive a telephone call from one of our [place] team members - your caller ID may say "Unknown caller." If this is a video visit, we will walk you through how to get the video launched on your phone. We will remind you check your blood pressure, heart rate and weight prior to your scheduled appointment. If you have an Apple Watch or Kardia, please upload any pertinent ECG strips the day before or morning of your appointment to [facility]. Our staff will also make sure you have reviewed the consent and agree to move forward with your scheduled tele-health visit.    THE DAY OF YOUR APPOINTMENT  Approximately 15 minutes prior to your scheduled appointment, you will  receive a telephone call from one of [facility] team - your caller ID may say "Unknown caller."  Our staff will confirm medications, vital signs for the day and any symptoms you may be experiencing. Please have this information available prior to the time of visit start. It may also be helpful for you to have a pad of paper and pen handy for any instructions given during your visit. They will also walk you through joining the smartphone meeting if this is a video visit.   CONSENT FOR TELE-HEALTH VISIT - PLEASE REVIEW  I hereby voluntarily request, consent and authorize [facility] and its employed or contracted physicians, physician assistants, nurse practitioners or other licensed health care professionals (the Practitioner), to provide me with telemedicine health care services (the "Services") as deemed necessary by the treating Practitioner. I acknowledge and consent to receive the Services by the Practitioner via telemedicine. I understand that the telemedicine visit will involve communicating with the Practitioner through live audiovisual communication technology and the disclosure of certain medical information by electronic transmission. I acknowledge that I have been given the opportunity to request an in-person assessment or other available alternative prior to the telemedicine visit and am voluntarily participating in the telemedicine visit.  I understand that I have the right to withhold or withdraw my consent to the use of telemedicine in the course of my care at any time, without affecting my right to future care or treatment, and that the Practitioner or I may terminate the telemedicine visit  at any time. I understand that I have the right to inspect all information obtained and/or recorded in the course of the telemedicine visit and may receive copies of available information for a reasonable fee.  I understand that some of the potential risks of receiving the Services via telemedicine  include:  [place]land Kitchen Delay or interruption in medical evaluation due to technological equipment failure or disruption; . Information transmitted may not be sufficient (e.g. poor resolution of images) to allow for appropriate medical decision making by the Practitioner; and/or  . In rare instances, security protocols could fail, causing a breach of personal health information.  Furthermore, I acknowledge that it is my responsibility to provide information about my medical history, conditions and care that is complete and accurate to the best of my ability. I acknowledge that Practitioner's advice, recommendations, and/or decision may be based on factors not within their control, such as incomplete or inaccurate data provided by me or distortions of diagnostic images or specimens that may result from electronic transmissions. I understand that the practice of medicine is not an exact science and that Practitioner makes no warranties or guarantees regarding treatment outcomes. I acknowledge that I will receive a copy of this consent concurrently upon execution via email to the email address I last provided but may also request a printed copy by calling the office of [facility].    I understand that my insurance will be billed for this visit.   I have read or had this consent read to me. . I understand the contents of this consent, which adequately explains the benefits and risks of the Services being provided via telemedicine.  . I have been provided ample opportunity to ask questions regarding this consent and the Services and have had my questions answered to my satisfaction. . I give my informed consent for the services to be provided through the use of telemedicine in my medical care  By participating in this telemedicine visit I agree to the above.

## 2019-02-21 ENCOUNTER — Emergency Department (HOSPITAL_COMMUNITY)
Admission: EM | Admit: 2019-02-21 | Discharge: 2019-02-21 | Disposition: A | Discharge status: Home / Self Care | Payer: No Typology Code available for payment source | Attending: Emergency Medicine | Admitting: Emergency Medicine

## 2019-02-21 ENCOUNTER — Other Ambulatory Visit: Payer: Self-pay

## 2019-02-21 ENCOUNTER — Emergency Department (HOSPITAL_COMMUNITY): Payer: No Typology Code available for payment source

## 2019-02-21 ENCOUNTER — Encounter (HOSPITAL_COMMUNITY): Payer: Self-pay | Admitting: Emergency Medicine

## 2019-02-21 DIAGNOSIS — I10 Essential (primary) hypertension: Secondary | ICD-10-CM | POA: Diagnosis not present

## 2019-02-21 DIAGNOSIS — Z79899 Other long term (current) drug therapy: Secondary | ICD-10-CM | POA: Insufficient documentation

## 2019-02-21 DIAGNOSIS — J449 Chronic obstructive pulmonary disease, unspecified: Secondary | ICD-10-CM | POA: Diagnosis not present

## 2019-02-21 DIAGNOSIS — Z87891 Personal history of nicotine dependence: Secondary | ICD-10-CM | POA: Insufficient documentation

## 2019-02-21 DIAGNOSIS — N451 Epididymitis: Secondary | ICD-10-CM | POA: Insufficient documentation

## 2019-02-21 DIAGNOSIS — I2510 Atherosclerotic heart disease of native coronary artery without angina pectoris: Secondary | ICD-10-CM | POA: Diagnosis not present

## 2019-02-21 DIAGNOSIS — Z7901 Long term (current) use of anticoagulants: Secondary | ICD-10-CM | POA: Diagnosis not present

## 2019-02-21 DIAGNOSIS — N50812 Left testicular pain: Secondary | ICD-10-CM | POA: Diagnosis present

## 2019-02-21 LAB — URINALYSIS, ROUTINE W REFLEX MICROSCOPIC
Bacteria, UA: NONE SEEN
Bilirubin Urine: NEGATIVE
Glucose, UA: NEGATIVE mg/dL
Ketones, ur: 5 mg/dL — AB
Leukocytes,Ua: NEGATIVE
Nitrite: NEGATIVE
Protein, ur: NEGATIVE mg/dL
Specific Gravity, Urine: 1.019 (ref 1.005–1.030)
pH: 5 (ref 5.0–8.0)

## 2019-02-21 LAB — CBC WITH DIFFERENTIAL/PLATELET
Abs Immature Granulocytes: 0.02 10*3/uL (ref 0.00–0.07)
Basophils Absolute: 0 10*3/uL (ref 0.0–0.1)
Basophils Relative: 0 %
Eosinophils Absolute: 0 10*3/uL (ref 0.0–0.5)
Eosinophils Relative: 0 %
HCT: 37.8 % — ABNORMAL LOW (ref 39.0–52.0)
Hemoglobin: 11.1 g/dL — ABNORMAL LOW (ref 13.0–17.0)
Immature Granulocytes: 0 %
Lymphocytes Relative: 13 %
Lymphs Abs: 0.7 10*3/uL (ref 0.7–4.0)
MCH: 23.9 pg — ABNORMAL LOW (ref 26.0–34.0)
MCHC: 29.4 g/dL — ABNORMAL LOW (ref 30.0–36.0)
MCV: 81.3 fL (ref 80.0–100.0)
Monocytes Absolute: 0.8 10*3/uL (ref 0.1–1.0)
Monocytes Relative: 15 %
Neutro Abs: 3.7 10*3/uL (ref 1.7–7.7)
Neutrophils Relative %: 72 %
Platelets: 182 10*3/uL (ref 150–400)
RBC: 4.65 MIL/uL (ref 4.22–5.81)
RDW: 16.5 % — ABNORMAL HIGH (ref 11.5–15.5)
WBC: 5.3 10*3/uL (ref 4.0–10.5)
nRBC: 0 % (ref 0.0–0.2)

## 2019-02-21 LAB — BASIC METABOLIC PANEL
Anion gap: 10 (ref 5–15)
BUN: 24 mg/dL — ABNORMAL HIGH (ref 8–23)
CO2: 22 mmol/L (ref 22–32)
Calcium: 9 mg/dL (ref 8.9–10.3)
Chloride: 102 mmol/L (ref 98–111)
Creatinine, Ser: 2.02 mg/dL — ABNORMAL HIGH (ref 0.61–1.24)
GFR calc Af Amer: 38 mL/min — ABNORMAL LOW (ref >60)
GFR calc non Af Amer: 33 mL/min — ABNORMAL LOW (ref >60)
Glucose, Bld: 95 mg/dL (ref 70–99)
Potassium: 4.4 mmol/L (ref 3.5–5.1)
Sodium: 134 mmol/L — ABNORMAL LOW (ref 135–145)

## 2019-02-21 MED ORDER — OXYCODONE-ACETAMINOPHEN 5-325 MG PO TABS
1.0000 | ORAL_TABLET | Freq: Once | ORAL | Status: AC
  Administered 2019-02-21: 15:00:00 1 via ORAL
  Filled 2019-02-21: qty 1

## 2019-02-21 NOTE — ED Notes (Signed)
[place] provider at bedside.

## 2019-02-21 NOTE — ED Triage Notes (Signed)
Patient is from home and complains of a painful left swollen testicle. He was recently Dx with epididymitis. On Wednesday he wen to the urologist and the hospital and was sent home from both with antibiotics. He reports pain continues.   EMS vitals: 156/80 BP 88 HR 18 Resp Rate 98% O2 stats on room air 97.7 Temp

## 2019-02-21 NOTE — Discharge Instructions (Signed)
Please take the Levaquin as prescribed until the prescription runs out.  Please call your urologist on Monday to schedule follow-up appointment to be seen ideally on Monday or Tuesday for close recheck.  If they are unable to see you promptly then I would recommend calling your primary doctor for close follow-up appointment.  If you feel your pain is worsening you develop fevers, worsening abdominal pain or other new concerning symptoms please return to the ER for reassessment.

## 2019-02-21 NOTE — ED Notes (Signed)
Chronic O2 user at 2L.

## 2019-02-21 NOTE — ED Provider Notes (Signed)
[facility] DEPT Provider Note   CSN: [id number] Arrival date & time: 02/21/19  1225     History   Chief Complaint Chief Complaint  Patient presents with   Testicle Pain   Groin Swelling    HPI Gregory Christensen is a 68 y.o. male.  Presents emergency department with a chief complaint of painful swollen left testicle.  Patient has had recurrent issues with left testicular swelling, epididymitis since he had a nephrectomy for renal cell carcinoma in May.  Most recently started having testicular pain for the past week.  Went to his urologist on Wednesday and was started on Levaquin for presumed epididymitis.  Patient states has not had any improvement since starting that antibiotic, took it on Wednesday.  Went to emergency department on Thursday, labs unremarkable, ultrasound negative for torsion but concerning for epididymitis.  Given dose of Rocephin and Rx for doxycycline.  Patient reports he has not been taking the Levaquin since Thursday but has been taking the doxycycline yesterday and today.  States his pain is 10 out of 10 in severity, shoots from left testicle to left lower abdomen.  Sharp, stabbing, no alleviating factors.  Took Tylenol couple days ago with mild improvement, has not taken anything for his pain today.  No fevers, rashes.     HPI  Past Medical History:  Diagnosis Date   Acute deep vein thrombosis (DVT) of left lower extremity (HCC)    Acute hypoxemic respiratory failure (HCC)    Anxiety    Aortic atherosclerosis (HCC)    Arthritis    Benign prostatic hyperplasia 08/28/2018   UNSPECIFIED WHETHER LOWER URINARY TRACT SYMPTOMS PRESENT   CAD (coronary artery disease)    Chronic back pain    Chronic hip pain    COPD exacerbation (HCC) 10/18/2017   Emphysema lung (HCC)    Essential hypertension    GERD (gastroesophageal reflux disease)    Gout    Gross hematuria 09/08/2018   History of tobacco abuse    Hypoxia     Pulmonary embolism ([place]) 05/31/2018   Renal mass    CONCERNING FOR RENAL CELL CARCINOMA DR. Lovena Neighbours   Respiratory distress    Respiratory failure, acute-on-chronic ([place]) 08/03/2018   Seasonal allergies    Sleep apnea     Patient Active Problem List   Diagnosis Date Noted   Renal mass 10/31/2018   Chest pressure 10/15/2018   Coronary artery calcification 10/15/2018   Respiratory failure, acute-on-chronic ([place]) 08/03/2018   Acute deep vein thrombosis (DVT) of left lower extremity (HCC)    Acute hypoxemic respiratory failure ([place])    Pulmonary embolism ([place]) 05/31/2018   COPD exacerbation ([place]) 10/18/2017   Hypoxia    Respiratory distress    Essential hypertension     Past Surgical History:  Procedure Laterality Date   CYSTOSCOPY/URETEROSCOPY/HOLMIUM LASER/STENT PLACEMENT Left 10/31/2018   Procedure: CYSTOSCOPY/URETEROSCOPY;  Surgeon: Ceasar Mons, MD;  Location: [facility];  Service: Urology;  Laterality: Left;   [place]   EXTENSIVE   ROBOT ASSITED LAPAROSCOPIC NEPHROURETERECTOMY Left 10/31/2018   Procedure: XI ROBOT ASSITED LAPAROSCOPIC NEPHROURETERECTOMY;  Surgeon: Ceasar Mons, MD;  Location: [facility];  Service: Urology;  Laterality: Left;  ONLY NEEDS 240 MIN FOR ALL PROCEDURES        Home Medications    Prior to Admission medications   Medication Sig Start Date End Date Taking? Authorizing Provider  acetaminophen (TYLENOL) 500 MG tablet Take 1,000 mg  by mouth every 6 (six) hours as needed for moderate pain or headache.     [provider]  albuterol (PROVENTIL HFA;VENTOLIN HFA) 108 (90 Base) MCG/ACT inhaler Inhale 2 puffs into the lungs every 2 (two) hours as needed for wheezing or shortness of breath. 08/04/18   Reyne Dumas, MD  albuterol (PROVENTIL) (2.5 MG/3ML) 0.083% nebulizer solution Take 3 mLs (2.5 mg total) by nebulization every 6 (six) hours as needed for wheezing or shortness of  breath. 08/04/18   Reyne Dumas, MD  amLODipine (NORVASC) 2.5 MG tablet Take 2.5 mg by mouth daily.    [provider]  apixaban (ELIQUIS) 5 MG TABS tablet Take 1 tablet (5 mg total) by mouth 2 (two) times daily. 06/10/18   Regalado, Belkys A, MD  budesonide-formoterol (SYMBICORT) 160-4.5 MCG/ACT inhaler Inhale 1 puff into the lungs 2 (two) times daily. 08/04/18   Reyne Dumas, MD  docusate sodium (COLACE) 100 MG capsule Take 1 capsule (100 mg total) by mouth 2 (two) times daily. Patient not taking: Reported on 12/22/2018 10/31/18   Debbrah Alar, PA-C  doxycycline (VIBRAMYCIN) 100 MG capsule Take 1 capsule (100 mg total) by mouth 2 (two) times daily. 02/19/19   Isla Pence, MD  fluticasone (FLONASE) 50 MCG/ACT nasal spray Place 1 spray into both nostrils 2 (two) times daily. Patient taking differently: Place 1 spray into both nostrils 2 (two) times daily as needed for allergies.  08/04/18   Reyne Dumas, MD  gabapentin (NEURONTIN) 300 MG capsule Take 300 mg by mouth 2 (two) times daily.     [provider]  HYDROcodone-acetaminophen (NORCO/VICODIN) 5-325 MG tablet Take 1 tablet by mouth every 4 (four) hours as needed. 02/19/19   Isla Pence, MD  ketotifen (ZADITOR) 0.025 % ophthalmic solution Place 1 drop into both eyes 2 (two) times daily as needed (allergies).    [provider]  loratadine (CLARITIN) 10 MG tablet Take 10 mg by mouth daily.    [provider]  montelukast (SINGULAIR) 10 MG tablet Take 10 mg by mouth at bedtime.    [provider]  promethazine (PHENERGAN) 12.5 MG tablet Take 1 tablet (12.5 mg total) by mouth every 4 (four) hours as needed for nausea or vomiting. Patient not taking: Reported on 12/22/2018 10/31/18   Debbrah Alar, PA-C  Tiotropium Bromide Monohydrate (SPIRIVA RESPIMAT) 1.25 MCG/ACT AERS Inhale 2 puffs into the lungs daily.    [provider]    Family History Family History  Problem Relation Age of Onset    Diabetes Mother    Hypertension Sister    Hypertension Brother     Social History Social History   Tobacco Use   Smoking status: Former Smoker    Packs/day: 1.00    Years: 26.00    Pack years: 26.00    Types: Cigarettes    Quit date: 10/18/2017    Years since quitting: 1.3   Smokeless tobacco: Never Used  Substance Use Topics   Alcohol use: Yes    Comment: 2 beers a day    Drug use: Yes    Types: Flunitrazepam     Allergies   Patient has no known allergies.   Review of Systems Review of Systems  Constitutional: Negative for chills and fever.  HENT: Negative for ear pain and sore throat.   Eyes: Negative for pain and visual disturbance.  Respiratory: Negative for cough and shortness of breath.   Cardiovascular: Negative for chest pain and palpitations.  Gastrointestinal: Negative for abdominal pain  and vomiting.  Genitourinary: Positive for scrotal swelling and testicular pain. Negative for dysuria and hematuria.  Musculoskeletal: Negative for arthralgias and back pain.  Skin: Negative for color change and rash.  Neurological: Negative for seizures and syncope.  All other systems reviewed and are negative.    Physical Exam Updated Vital Signs Ht 6\' 1"  (1.854 m)    Wt 77.6 kg    BMI 22.57 kg/m   Physical Exam Vitals signs and nursing note reviewed.  Constitutional:      Appearance: He is well-developed.  HENT:     Head: Normocephalic and atraumatic.  Eyes:     Conjunctiva/sclera: Conjunctivae normal.  Neck:     Musculoskeletal: Neck supple.  Cardiovascular:     Rate and Rhythm: Normal rate and regular rhythm.     Heart sounds: No murmur.  Pulmonary:     Effort: Pulmonary effort is normal. No respiratory distress.     Breath sounds: Normal breath sounds.  Abdominal:     Palpations: Abdomen is soft.     Tenderness: There is no abdominal tenderness.  Genitourinary:    Comments: Penis normal, right testicular normal and nontender, left testicle  somewhat swollen, exquisitely tender to palpation, no warmth or erythema noted, normal lie of both testicles Skin:    General: Skin is warm and dry.  Neurological:     Mental Status: He is alert.      ED Treatments / Results  Labs (all labs ordered are listed, but only abnormal results are displayed) Labs Reviewed - No data to display  EKG None  Radiology Ct Abdomen Pelvis Wo Contrast  Result Date: 02/19/2019 CLINICAL DATA:  Abdominal pain history of left nephrectomy EXAM: CT ABDOMEN AND PELVIS WITHOUT CONTRAST TECHNIQUE: Multidetector CT imaging of the abdomen and pelvis was performed following the standard protocol without IV contrast. COMPARISON:  CT 12/23/2018 FINDINGS: Lower chest: Lung bases demonstrate no acute consolidation or effusion. Emphysematous disease with mild fibrosis at the right base. Normal heart size. Hepatobiliary: No focal liver abnormality is seen. No gallstones, gallbladder wall thickening, or biliary dilatation. Pancreas: Unremarkable. No pancreatic ductal dilatation or surrounding inflammatory changes. Spleen: Normal in size without focal abnormality. Adrenals/Urinary Tract: Right adrenal gland and kidney are normal. Patient is status post left nephrectomy. The urinary bladder is unremarkable Stomach/Bowel: Stomach is within normal limits. Appendix appears normal. No evidence of bowel wall thickening, distention, or inflammatory changes. Vascular/Lymphatic: Extensive aortic atherosclerosis without aneurysm. No significantly enlarged lymph nodes. Reproductive: Enlarged prostate with mass effect on the bladder. Skin thickening left scrotum with partially visible hydrocele. Other: Negative for free air or free fluid. Musculoskeletal: No acute or suspicious osseous abnormality. IMPRESSION: 1. Status post left nephrectomy. No CT evidence for acute intra-abdominal or pelvic abnormality 2. Skin thickening of the left scrotum with partially visible hydrocele 3. Enlarged  prostate 4. Emphysematous disease Electronically Signed   By: Donavan Foil M.D.   On: 02/19/2019 19:30   US Scrotum W/doppler  Result Date: 02/19/2019 CLINICAL DATA:  Left testicle pain since June EXAM: SCROTAL ULTRASOUND DOPPLER ULTRASOUND OF THE TESTICLES TECHNIQUE: Complete ultrasound examination of the testicles, epididymis, and other scrotal structures was performed. Color and spectral Doppler ultrasound were also utilized to evaluate blood flow to the testicles. COMPARISON:  Ultrasound 12/25/2018, 12/22/2018 FINDINGS: Right testicle Measurements: 3.9 x 1.9 x 2.2 cm. No mass or microlithiasis visualized. Left testicle Measurements:  3.2 x 1.9 x 2.1 cm.  Small cyst measuring 4.4 mm. Right epididymis:  Normal in size  and appearance. Left epididymis: Enlarged heterogeneous left epididymis with hyperemia. Hydrocele:  Small left hydrocele. Varicocele:  None visualized. Pulsed Doppler interrogation of both testes demonstrates normal low resistance arterial and venous waveforms bilaterally. IMPRESSION: 1. Negative for acute testicular torsion 2. Enlarged heterogeneous and hyperemic left epididymis suggesting epididymitis,. Slightly worsened as compared with scrotal ultrasound from June 2020. No abscess. Small left hydrocele containing scattered particular matter. Electronically Signed   By: Donavan Foil M.D.   On: 02/19/2019 19:18    Procedures Procedures (including critical care time)  Medications Ordered in ED Medications - No data to display   Initial Impression / Assessment and Plan / ED Course  I have reviewed the triage vital signs and the nursing notes.  Pertinent labs & imaging results that were available during my care of the patient were reviewed by me and considered in my medical decision making (see chart for details).  Clinical Course as of Feb 20 1838  Sat Feb 21, 2019  1313 Janett Billow NT chaperoned GU exam   [RD]  1313 Completed initial assessment   [RD]  5277 Reviewed labs,  urine, [place] - no leukocytosis, no change in [place] from prior, no torsion - reviewed abx selection with pharmacy on call - recommends continuing levaquin, doxy does not provide additional coverage that levaquin wouldn't   [RD]    Clinical Course User Index [RD] Lucrezia Starch, MD       C66-year-old gentleman who presented to the emergency department with left testicular pain and swelling.  Recent diagnosis epididymitis, had been started on Levaquin by urologist earlier this week, then started on Rocephin and doxy yesterday in our ER.  Today repeated labs, no significant changes, no leukocytosis.  Repeated ultrasound, no significant change from prior on Thursday.  Suspect symptoms related to epididymitis.  I reviewed antibiotic selection with pharmacy, Levaquin would provide best coverage going forward particularly given his history of nephrectomy.  Patient otherwise well-appearing, soft abdomen, stable vital signs, afebrile.  Believe appropriate for continued outpatient management this time.  Recommend close recheck with primary doctor and urologist early next week.  Reviewed strict precautions with patient who is agreeable.  After the discussed management above, the patient was determined to be safe for discharge.  The patient was in agreement with this plan and all questions regarding their care were answered.  ED return precautions were discussed and the patient will return to the ED with any significant worsening of condition.    Final Clinical Impressions(s) / ED Diagnoses   Final diagnoses:  Epididymitis    ED Discharge Orders    None       Lucrezia Starch, MD 02/21/19 [phone]

## 2019-02-21 NOTE — ED Notes (Signed)
Patient wears O2 at home. When RN discussed staying connected to the O2 until his ride could come with his own O2, he declined and said, "I'll be alright." RN asked the patient again letting him now allowances could be made so he could stay on the O2. Patient again declined.

## 2019-02-23 DIAGNOSIS — N453 Epididymo-orchitis: Secondary | ICD-10-CM | POA: Diagnosis not present

## 2019-02-23 DIAGNOSIS — C652 Malignant neoplasm of left renal pelvis: Secondary | ICD-10-CM | POA: Diagnosis not present

## 2019-02-24 ENCOUNTER — Telehealth: Payer: Self-pay

## 2019-02-24 NOTE — Telephone Encounter (Signed)
meds reviewed

## 2019-02-25 ENCOUNTER — Other Ambulatory Visit: Payer: Self-pay

## 2019-02-25 ENCOUNTER — Telehealth (INDEPENDENT_AMBULATORY_CARE_PROVIDER_SITE_OTHER): Payer: Medicare HMO | Admitting: Cardiovascular Disease

## 2019-02-25 VITALS — BP 134/69 | HR 87 | Wt 171.0 lb

## 2019-02-25 DIAGNOSIS — I2584 Coronary atherosclerosis due to calcified coronary lesion: Secondary | ICD-10-CM

## 2019-02-25 DIAGNOSIS — I2510 Atherosclerotic heart disease of native coronary artery without angina pectoris: Secondary | ICD-10-CM | POA: Diagnosis not present

## 2019-02-25 DIAGNOSIS — I2699 Other pulmonary embolism without acute cor pulmonale: Secondary | ICD-10-CM | POA: Diagnosis not present

## 2019-02-25 DIAGNOSIS — Z7189 Other specified counseling: Secondary | ICD-10-CM

## 2019-02-25 NOTE — Patient Instructions (Signed)
Medication Instructions:  No changes If you need a refill on your cardiac medications before your next appointment, please call your pharmacy.   Lab work: none If you have labs (blood work) drawn today and your tests are completely normal, you will receive your results only by: Marland Kitchen MyChart Message (if you have MyChart) OR . A paper copy in the mail If you have any lab test that is abnormal or we need to change your treatment, we will call you to review the results.  Testing/Procedures: none  Follow-Up: Your physician recommends that you schedule a follow-up appointment as needed with Dr. Acie Fredrickson.  Any Other Special Instructions Will Be Listed Below (If Applicable).

## 2019-02-25 NOTE — Progress Notes (Signed)
Virtual Visit via Telephone Note   This visit type was conducted due to national recommendations for restrictions regarding the COVID-19 Pandemic (e.g. social distancing) in an effort to limit this patient's exposure and mitigate transmission in our community.  Due to his co-morbid illnesses, this patient is at least at moderate risk for complications without adequate follow up.  This format is felt to be most appropriate for this patient at this time.  The patient did not have access to video technology/had technical difficulties with video requiring transitioning to audio format only (telephone).  All issues noted in this document were discussed and addressed.  No physical exam could be performed with this format.  Please refer to the patient's chart for his  consent to telehealth for [facility]   Date:  02/25/2019   ID:  Gregory Christensen, DOB 11/01/1950, MRN [id number]  Patient Location: Home Provider Location: Home  PCP:  Leighton Ruff, MD  Cardiologist:  Mertie Moores, MD  Electrophysiologist:  None   Previous Notes Gregory Christensen is a 68 y.o. male with a history of chronic obstructive pulmonary disease, pulmonary embolism, hypertension.  He has had several hospitalizations over the past 6 months.  On May 31, 2018 he was admitted to the hospital after waking up in the middle of the night with a very sharp pain on the right side of his chest.  The pain was pleuritic.  He went to the emergency room.  CT angiogram was negative for pulmonary embolus but a d-dimer was 2.8.  The patient was started on heparin and was transitioned to Eliquis.  He was discharged to home on June 04, 2018.   Echocardiogram performed on June 01, 2018 reveals normal left ventricular systolic function.  He has an ejection fraction of 55 to 60%.  He has grade 1 diastolic dysfunction.  He had a CT angio which revealed 3 V coronary art calcification  He has severe DOE walking 10-15 feet. Like someone  sitting on his chest  Pressure like sensation Lasts for few minutes.  Associated with diaphoresis No syncope or presyncope  Has been present for the past 4-5 years   No recent lipid leves + family history of CAD Father , mother - HTN DM  Does not get any exercise Is on home O2  Also has orthostatic hypotension   Sees a pulmonologist at the [place], MD   Previously installed shelving    No fever,   No cough, dyspnea is chronic  No aches     Evaluation Performed:  Follow-Up Visit  Chief Complaint:  Dyspnea   Aug. 26, 2020     Gregory Christensen is a 68 y.o. male with dyspnea.  He was seen previously for pre op eval prior to nephrectomy for urothelial cancer   No cp or dyspnea.   \  He has 3 V coronary art calcification myoview on October 17, 2018 showed no ischemia or infarction.   Low risk  Hx of pulmonary embolus.  Managed through the [facility] [place] [facility] in [place]  Still smoking .   Advised him to stop    The patient does not have symptoms concerning for COVID-19 infection (fever, chills, cough, or new shortness of breath).    Past Medical History:  Diagnosis Date  . Acute deep vein thrombosis (DVT) of left lower extremity ([place])   . Acute hypoxemic respiratory failure ([place])   . Anxiety   . Aortic atherosclerosis ([place])   . Arthritis   .  Benign prostatic hyperplasia 08/28/2018   UNSPECIFIED WHETHER LOWER URINARY TRACT SYMPTOMS PRESENT  . CAD (coronary artery disease)   . Chronic back pain   . Chronic hip pain   . COPD exacerbation ([place]) 10/18/2017  . Emphysema lung ([place])   . Essential hypertension   . GERD (gastroesophageal reflux disease)   . Gout   . Gross hematuria 09/08/2018  . History of tobacco abuse   . Hypoxia   . Pulmonary embolism ([place]) 05/31/2018  . Renal mass    CONCERNING FOR RENAL CELL CARCINOMA DR. Lovena Neighbours  . Respiratory distress   . Respiratory failure, acute-on-chronic ([place]) 08/03/2018  . Seasonal allergies   .  Sleep apnea    Past Surgical History:  Procedure Laterality Date  . CYSTOSCOPY/URETEROSCOPY/HOLMIUM LASER/STENT PLACEMENT Left 10/31/2018   Procedure: CYSTOSCOPY/URETEROSCOPY;  Surgeon: Ceasar Mons, MD;  Location: [facility];  Service: Urology;  Laterality: Left;  . HERNIA REPAIR  1977  . MANDIBLE SURGERY  1976   EXTENSIVE  . ROBOT ASSITED LAPAROSCOPIC NEPHROURETERECTOMY Left 10/31/2018   Procedure: XI ROBOT ASSITED LAPAROSCOPIC NEPHROURETERECTOMY;  Surgeon: Ceasar Mons, MD;  Location[facility];  Service: Urology;  Laterality: Left;  ONLY NEEDS 240 MIN FOR ALL PROCEDURES     No outpatient medications have been marked as taking for the 02/25/19 encounter (Appointment) with Ofilia Rayon, Wonda Cheng, MD.     Allergies:   Patient has no known allergies.   Social History   Tobacco Use  . Smoking status: Former Smoker    Packs/day: 1.00    Years: 26.00    Pack years: 26.00    Types: Cigarettes    Quit date: 10/18/2017    Years since quitting: 1.3  . Smokeless tobacco: Never Used  Substance Use Topics  . Alcohol use: Yes    Comment: 2 beers a day   . Drug use: Yes    Types: Flunitrazepam     Family Hx: The patient's family history includes Diabetes in his mother; Hypertension in his brother and sister.  ROS:   Please see the history of present illness.     All other systems reviewed and are negative.   Prior CV studies:   The following studies were reviewed today:    Labs/Other Tests and Data Reviewed:    EKG:  No ECG reviewed.  Recent Labs: 06/02/2018: TSH 0.466 08/18/2018: Magnesium 2.2 02/19/2019: ALT 24 02/21/2019: BUN 24; Creatinine, Ser 2.02; Hemoglobin 11.1; Platelets 182; Potassium 4.4; Sodium 134   Recent Lipid Panel No results found for: CHOL, TRIG, HDL, CHOLHDL, LDLCALC, LDLDIRECT  Wt Readings from Last 3 Encounters:  02/21/19 171 lb 1.2 oz (77.6 kg)  02/19/19 171 lb (77.6 kg)  12/22/18 168 lb 1.6 oz (76.2 kg)     Objective:    Vital  Signs:  There were no vitals taken for this visit.     ASSESSMENT & PLAN:    1. Coronary artery calcifications: Gregory Christensen with incidentally  found to have coronary artery calcifications by CT scan.  I saw him as a virtual visit several months ago prior to kidney surgery.  Because of his chronic shortness of breath and the coronary calcifications we performed a Lexiscan Myoview study which revealed no evidence of ischemia and normal left ventricular function.  He was deemed to be low risk for his surgery.  He has had a surgery and did not have any cardiac complications.  He has had some continued problems with swelling in his testicles and inflammation.  He continues  to smoke.  I advised him to stop smoking.  At this point he will return to see his primary medical doctor.  I have offered to see him on an as-needed basis but at this point I do not think there is much that we have to offer on a regular basis.  2.  History of pulmonary embolus: Continue Eliquis.  This is being managed by the [facility].  COVID-19 Education: The signs and symptoms of COVID-19 were discussed with the patient and how to seek care for testing (follow up with PCP or arrange E-visit).  The importance of social distancing was discussed today.  Time:   Today, I have spent  18  minutes with the patient with telehealth technology discussing the above problems.     Medication Adjustments/Labs and Tests Ordered: Current medicines are reviewed at length with the patient today.  Concerns regarding medicines are outlined above.   Tests Ordered: No orders of the defined types were placed in this encounter.   Medication Changes: No orders of the defined types were placed in this encounter.   Follow Up: PRN   Signed, Mertie Moores, MD  02/25/2019 9:15 AM    [place]

## 2019-02-28 ENCOUNTER — Other Ambulatory Visit: Payer: Self-pay

## 2019-02-28 ENCOUNTER — Inpatient Hospital Stay (HOSPITAL_COMMUNITY)
Admission: EM | Admit: 2019-02-28 | Discharge: 2019-03-05 | DRG: 177 | Disposition: A | Discharge status: Home / Self Care | Payer: Medicare HMO | Attending: Internal Medicine | Admitting: Internal Medicine

## 2019-02-28 ENCOUNTER — Emergency Department (HOSPITAL_COMMUNITY): Payer: Medicare HMO

## 2019-02-28 ENCOUNTER — Encounter (HOSPITAL_COMMUNITY): Payer: Self-pay

## 2019-02-28 DIAGNOSIS — J302 Other seasonal allergic rhinitis: Secondary | ICD-10-CM | POA: Diagnosis present

## 2019-02-28 DIAGNOSIS — N184 Chronic kidney disease, stage 4 (severe): Secondary | ICD-10-CM | POA: Diagnosis present

## 2019-02-28 DIAGNOSIS — Z86711 Personal history of pulmonary embolism: Secondary | ICD-10-CM | POA: Diagnosis not present

## 2019-02-28 DIAGNOSIS — I2510 Atherosclerotic heart disease of native coronary artery without angina pectoris: Secondary | ICD-10-CM | POA: Diagnosis present

## 2019-02-28 DIAGNOSIS — Z960 Presence of urogenital implants: Secondary | ICD-10-CM | POA: Diagnosis present

## 2019-02-28 DIAGNOSIS — F1990 Other psychoactive substance use, unspecified, uncomplicated: Secondary | ICD-10-CM | POA: Diagnosis present

## 2019-02-28 DIAGNOSIS — Z87891 Personal history of nicotine dependence: Secondary | ICD-10-CM

## 2019-02-28 DIAGNOSIS — R300 Dysuria: Secondary | ICD-10-CM | POA: Diagnosis present

## 2019-02-28 DIAGNOSIS — Z86718 Personal history of other venous thrombosis and embolism: Secondary | ICD-10-CM

## 2019-02-28 DIAGNOSIS — F419 Anxiety disorder, unspecified: Secondary | ICD-10-CM | POA: Diagnosis present

## 2019-02-28 DIAGNOSIS — J9601 Acute respiratory failure with hypoxia: Secondary | ICD-10-CM | POA: Diagnosis not present

## 2019-02-28 DIAGNOSIS — Z833 Family history of diabetes mellitus: Secondary | ICD-10-CM

## 2019-02-28 DIAGNOSIS — M109 Gout, unspecified: Secondary | ICD-10-CM | POA: Diagnosis present

## 2019-02-28 DIAGNOSIS — I700 Atherosclerosis of aorta: Secondary | ICD-10-CM | POA: Diagnosis not present

## 2019-02-28 DIAGNOSIS — Z905 Acquired absence of kidney: Secondary | ICD-10-CM | POA: Diagnosis not present

## 2019-02-28 DIAGNOSIS — U071 COVID-19: Secondary | ICD-10-CM | POA: Diagnosis not present

## 2019-02-28 DIAGNOSIS — Z85528 Personal history of other malignant neoplasm of kidney: Secondary | ICD-10-CM

## 2019-02-28 DIAGNOSIS — Z906 Acquired absence of other parts of urinary tract: Secondary | ICD-10-CM

## 2019-02-28 DIAGNOSIS — J1289 Other viral pneumonia: Secondary | ICD-10-CM | POA: Diagnosis present

## 2019-02-28 DIAGNOSIS — J440 Chronic obstructive pulmonary disease with acute lower respiratory infection: Secondary | ICD-10-CM | POA: Diagnosis present

## 2019-02-28 DIAGNOSIS — R0602 Shortness of breath: Secondary | ICD-10-CM | POA: Diagnosis not present

## 2019-02-28 DIAGNOSIS — I129 Hypertensive chronic kidney disease with stage 1 through stage 4 chronic kidney disease, or unspecified chronic kidney disease: Secondary | ICD-10-CM | POA: Diagnosis present

## 2019-02-28 DIAGNOSIS — Z8249 Family history of ischemic heart disease and other diseases of the circulatory system: Secondary | ICD-10-CM

## 2019-02-28 DIAGNOSIS — K219 Gastro-esophageal reflux disease without esophagitis: Secondary | ICD-10-CM | POA: Diagnosis present

## 2019-02-28 DIAGNOSIS — Z79899 Other long term (current) drug therapy: Secondary | ICD-10-CM

## 2019-02-28 DIAGNOSIS — N2889 Other specified disorders of kidney and ureter: Secondary | ICD-10-CM | POA: Diagnosis present

## 2019-02-28 DIAGNOSIS — G4730 Sleep apnea, unspecified: Secondary | ICD-10-CM | POA: Diagnosis present

## 2019-02-28 DIAGNOSIS — G8929 Other chronic pain: Secondary | ICD-10-CM | POA: Diagnosis present

## 2019-02-28 DIAGNOSIS — N400 Enlarged prostate without lower urinary tract symptoms: Secondary | ICD-10-CM | POA: Diagnosis present

## 2019-02-28 DIAGNOSIS — R072 Precordial pain: Secondary | ICD-10-CM | POA: Diagnosis not present

## 2019-02-28 DIAGNOSIS — Z7951 Long term (current) use of inhaled steroids: Secondary | ICD-10-CM

## 2019-02-28 DIAGNOSIS — J988 Other specified respiratory disorders: Secondary | ICD-10-CM | POA: Diagnosis not present

## 2019-02-28 DIAGNOSIS — Z7289 Other problems related to lifestyle: Secondary | ICD-10-CM

## 2019-02-28 DIAGNOSIS — Z7901 Long term (current) use of anticoagulants: Secondary | ICD-10-CM

## 2019-02-28 DIAGNOSIS — J1282 Pneumonia due to coronavirus disease 2019: Secondary | ICD-10-CM | POA: Diagnosis present

## 2019-02-28 LAB — CBC WITH DIFFERENTIAL/PLATELET
Abs Immature Granulocytes: 0.02 10*3/uL (ref 0.00–0.07)
Basophils Absolute: 0 10*3/uL (ref 0.0–0.1)
Basophils Relative: 0 %
Eosinophils Absolute: 0 10*3/uL (ref 0.0–0.5)
Eosinophils Relative: 0 %
HCT: 34.8 % — ABNORMAL LOW (ref 39.0–52.0)
Hemoglobin: 10.4 g/dL — ABNORMAL LOW (ref 13.0–17.0)
Immature Granulocytes: 1 %
Lymphocytes Relative: 15 %
Lymphs Abs: 0.5 10*3/uL — ABNORMAL LOW (ref 0.7–4.0)
MCH: 23.6 pg — ABNORMAL LOW (ref 26.0–34.0)
MCHC: 29.9 g/dL — ABNORMAL LOW (ref 30.0–36.0)
MCV: 79.1 fL — ABNORMAL LOW (ref 80.0–100.0)
Monocytes Absolute: 0.3 10*3/uL (ref 0.1–1.0)
Monocytes Relative: 8 %
Neutro Abs: 2.4 10*3/uL (ref 1.7–7.7)
Neutrophils Relative %: 76 %
Platelets: 185 10*3/uL (ref 150–400)
RBC: 4.4 MIL/uL (ref 4.22–5.81)
RDW: 16.8 % — ABNORMAL HIGH (ref 11.5–15.5)
WBC: 3.1 10*3/uL — ABNORMAL LOW (ref 4.0–10.5)
nRBC: 0 % (ref 0.0–0.2)

## 2019-02-28 LAB — POCT I-STAT 7, (LYTES, BLD GAS, ICA,H+H)
Acid-base deficit: 6 mmol/L — ABNORMAL HIGH (ref 0.0–2.0)
Bicarbonate: 18.8 mmol/L — ABNORMAL LOW (ref 20.0–28.0)
Calcium, Ion: 1.22 mmol/L (ref 1.15–1.40)
HCT: 32 % — ABNORMAL LOW (ref 39.0–52.0)
Hemoglobin: 10.9 g/dL — ABNORMAL LOW (ref 13.0–17.0)
O2 Saturation: 99 %
Patient temperature: 98.2
Potassium: 4.3 mmol/L (ref 3.5–5.1)
Sodium: 136 mmol/L (ref 135–145)
TCO2: 20 mmol/L — ABNORMAL LOW (ref 22–32)
pCO2 arterial: 31.7 mmHg — ABNORMAL LOW (ref 32.0–48.0)
pH, Arterial: 7.38 (ref 7.350–7.450)
pO2, Arterial: 124 mmHg — ABNORMAL HIGH (ref 83.0–108.0)

## 2019-02-28 LAB — COMPREHENSIVE METABOLIC PANEL
ALT: 21 U/L (ref 0–44)
AST: 30 U/L (ref 15–41)
Albumin: 3.5 g/dL (ref 3.5–5.0)
Alkaline Phosphatase: 102 U/L (ref 38–126)
Anion gap: 12 (ref 5–15)
BUN: 16 mg/dL (ref 8–23)
CO2: 19 mmol/L — ABNORMAL LOW (ref 22–32)
Calcium: 8.8 mg/dL — ABNORMAL LOW (ref 8.9–10.3)
Chloride: 103 mmol/L (ref 98–111)
Creatinine, Ser: 2.04 mg/dL — ABNORMAL HIGH (ref 0.61–1.24)
GFR calc Af Amer: 38 mL/min — ABNORMAL LOW (ref >60)
GFR calc non Af Amer: 33 mL/min — ABNORMAL LOW (ref >60)
Glucose, Bld: 114 mg/dL — ABNORMAL HIGH (ref 70–99)
Potassium: 4.3 mmol/L (ref 3.5–5.1)
Sodium: 134 mmol/L — ABNORMAL LOW (ref 135–145)
Total Bilirubin: 0.4 mg/dL (ref 0.3–1.2)
Total Protein: 7.3 g/dL (ref 6.5–8.1)

## 2019-02-28 LAB — SARS CORONAVIRUS 2 BY RT PCR (HOSPITAL ORDER, PERFORMED IN ~~LOC~~ HOSPITAL LAB): SARS Coronavirus 2: POSITIVE — AB

## 2019-02-28 LAB — TROPONIN I (HIGH SENSITIVITY)
Troponin I (High Sensitivity): 4 ng/L (ref <18)
Troponin I (High Sensitivity): 7 ng/L (ref <18)

## 2019-02-28 MED ORDER — METHYLPREDNISOLONE SODIUM SUCC 125 MG IJ SOLR
125.0000 mg | Freq: Once | INTRAMUSCULAR | Status: AC
  Administered 2019-02-28: 20:00:00 125 mg via INTRAVENOUS
  Filled 2019-02-28: qty 2

## 2019-02-28 MED ORDER — ALBUTEROL SULFATE HFA 108 (90 BASE) MCG/ACT IN AERS
6.0000 | INHALATION_SPRAY | Freq: Once | RESPIRATORY_TRACT | Status: AC
  Administered 2019-02-28: 6 via RESPIRATORY_TRACT
  Filled 2019-02-28: qty 6.7

## 2019-02-28 NOTE — H&P (Signed)
History and Physical  Gregory Christensen URK:[id number] DOB: June 18, 1951 DOA: 02/28/2019  Referring physician: ER provider PCP: Leighton Ruff, MD  Outpatient Specialists:    Patient coming from: Home  Chief Complaint: Shortness of breath  HPI:  Patient is a 68 year old African-American male, poor historian, with past medical history significant for COPD, coronary artery disease, chronic kidney disease stage III, DVT and pulmonary embolism, renal mass and hypertension amongst other multiple medical and cardiac problems.  Patient presents with shortness of breath that started earlier today.  Patient is suggestible.  Patient endorses fever, chills, cough that is productive of dark phlegm.  No associated GI symptoms.  No headache or anosmia.  No neck pain, no GI symptoms or urinary symptoms.  Patient reported chest pain, but feels high sensitive troponin is negative and EKG revealed normal sinus rhythm.  On presentation to the hospital, patient was managed with IV Solu-Medrol and albuterol inhalers, with improvement in respiratory symptoms.  Chest pain remains intermittent.  COVID-19 test came back positive.  Chest x-ray is nonrevealing.  Hospitalist team has been asked to admit patient for further assessment and management.  ED Course: On presentation to the hospital, patient was reported to be in significant respiratory distress, afebrile, respiratory rate of 22/min, blood pressure of 113/59 mmHg.  Last O2 sat was 100% on supplemental oxygen via nasal cannula.  Pertinent labs reveal WBC of 3.1, hemoglobin of 10.4, sodium of 134, CO2 of 19, BUN of 16 and serum creatinine of 2.04 (at baseline).  Pertinent labs: As documented above.  EKG: Independently reviewed.   Imaging: independently reviewed.   Review of Systems:  Negative for fever, visual changes, sore throat, rash, new muscle aches, dysuria, bleeding, n/v/abdominal pain.  Past Medical History:  Diagnosis Date  . Acute deep vein thrombosis  (DVT) of left lower extremity ([place])   . Acute hypoxemic respiratory failure ([place])   . Anxiety   . Aortic atherosclerosis ([place])   . Arthritis   . Benign prostatic hyperplasia 08/28/2018   UNSPECIFIED WHETHER LOWER URINARY TRACT SYMPTOMS PRESENT  . CAD (coronary artery disease)   . Chronic back pain   . Chronic hip pain   . COPD exacerbation ([place]) 10/18/2017  . Emphysema lung ([place])   . Essential hypertension   . GERD (gastroesophageal reflux disease)   . Gout   . Gross hematuria 09/08/2018  . History of tobacco abuse   . Hypoxia   . Pulmonary embolism ([place]) 05/31/2018  . Renal mass    CONCERNING FOR RENAL CELL CARCINOMA DR. Lovena Neighbours  . Respiratory distress   . Respiratory failure, acute-on-chronic ([place]) 08/03/2018  . Seasonal allergies   . Sleep apnea     Past Surgical History:  Procedure Laterality Date  . CYSTOSCOPY/URETEROSCOPY/HOLMIUM LASER/STENT PLACEMENT Left 10/31/2018   Procedure: CYSTOSCOPY/URETEROSCOPY;  Surgeon: Ceasar Mons, MD;  Location: [facility];  Service: Urology;  Laterality: Left;  . HERNIA REPAIR  1977  . MANDIBLE SURGERY  1976   EXTENSIVE  . ROBOT ASSITED LAPAROSCOPIC NEPHROURETERECTOMY Left 10/31/2018   Procedure: XI ROBOT ASSITED LAPAROSCOPIC NEPHROURETERECTOMY;  Surgeon: Ceasar Mons, MD;  Location: [facility];  Service: Urology;  Laterality: Left;  ONLY NEEDS 240 MIN FOR ALL PROCEDURES     reports that he quit smoking about 16 months ago. His smoking use included cigarettes. He has a 26.00 pack-year smoking history. He has never used smokeless tobacco. He reports current alcohol use. He reports current drug use. Drug: Flunitrazepam.  No Known Allergies  Family History  Problem  Relation Age of Onset  . Diabetes Mother   . Hypertension Sister   . Hypertension Brother      Prior to Admission medications   Medication Sig Start Date End Date Taking? Authorizing Provider  acetaminophen (TYLENOL) 500 MG tablet Take 1,000 mg by mouth every  6 (six) hours as needed for moderate pain or headache.     [provider]  albuterol (PROVENTIL HFA;VENTOLIN HFA) 108 (90 Base) MCG/ACT inhaler Inhale 2 puffs into the lungs every 2 (two) hours as needed for wheezing or shortness of breath. 08/04/18   Reyne Dumas, MD  albuterol (PROVENTIL) (2.5 MG/3ML) 0.083% nebulizer solution Take 3 mLs (2.5 mg total) by nebulization every 6 (six) hours as needed for wheezing or shortness of breath. 08/04/18   Reyne Dumas, MD  amLODipine (NORVASC) 2.5 MG tablet Take 2.5 mg by mouth daily.    [provider]  apixaban (ELIQUIS) 5 MG TABS tablet Take 1 tablet (5 mg total) by mouth 2 (two) times daily. 06/10/18   Regalado, Belkys A, MD  budesonide-formoterol (SYMBICORT) 160-4.5 MCG/ACT inhaler Inhale 1 puff into the lungs 2 (two) times daily. 08/04/18   Reyne Dumas, MD  docusate sodium (COLACE) 100 MG capsule Take 1 capsule (100 mg total) by mouth 2 (two) times daily. 10/31/18   Debbrah Alar, PA-C  fluticasone (FLONASE) 50 MCG/ACT nasal spray Place 1 spray into both nostrils 2 (two) times daily. Patient taking differently: Place 1 spray into both nostrils 2 (two) times daily as needed for allergies.  08/04/18   Reyne Dumas, MD  gabapentin (NEURONTIN) 300 MG capsule Take 300 mg by mouth 2 (two) times daily.     [provider]  HYDROcodone-acetaminophen (NORCO/VICODIN) 5-325 MG tablet Take 1 tablet by mouth every 4 (four) hours as needed. 02/19/19   Isla Pence, MD  ketotifen (ZADITOR) 0.025 % ophthalmic solution Place 1 drop into both eyes 2 (two) times daily as needed (allergies).    [provider]  levofloxacin (LEVAQUIN) 250 MG tablet Take 250 mg by mouth daily. 02/18/19   [provider]  loratadine (CLARITIN) 10 MG tablet Take 10 mg by mouth daily.    [provider]  montelukast (SINGULAIR) 10 MG tablet Take 10 mg by mouth at bedtime.    [provider]  Tiotropium Bromide Monohydrate (SPIRIVA  RESPIMAT) 1.25 MCG/ACT AERS Inhale 2 puffs into the lungs daily.    [provider]    Physical Exam: Vitals:   02/28/19 2045 02/28/19 2100 02/28/19 2115 02/28/19 2130  BP: (!) 132/59 (!) 127/58 (!) 120/57 (!) 113/59  Pulse: 72 72 71 74  Resp: 18 (!) 22 (!) 21 (!) 22  Temp:      TempSrc:      SpO2: 98% 98% 99% 100%   Constitutional:  . Appears calm and comfortable Eyes:  . No pallor. No jaundice.  ENMT:  . external ears, nose appear normal Neck:  . Neck is supple. No JVD Respiratory:  Decreased air entry.   Cardiovascular:  . S1S2 . No LE extremity edema   Abdomen:  . Abdomen is soft and non tender. Organs are difficult to assess. Neurologic:  . Awake and alert. . Moves all limbs.  Wt Readings from Last 3 Encounters:  02/25/19 77.6 kg  02/21/19 77.6 kg  02/19/19 77.6 kg    I have personally reviewed following labs and imaging studies  Labs on Admission:  CBC: Recent Labs  Lab 02/28/19 1929  WBC 3.1*  NEUTROABS 2.4  HGB 10.4*  HCT 34.8*  MCV 79.1*  PLT 161   Basic Metabolic Panel: Recent Labs  Lab 02/28/19 1929  NA 134*  K 4.3  CL 103  CO2 19*  GLUCOSE 114*  BUN 16  CREATININE 2.04*  CALCIUM 8.8*   Liver Function Tests: Recent Labs  Lab 02/28/19 1929  AST 30  ALT 21  ALKPHOS 102  BILITOT 0.4  PROT 7.3  ALBUMIN 3.5   No results for input(s): LIPASE, AMYLASE in the last 168 hours. No results for input(s): AMMONIA in the last 168 hours. Coagulation Profile: No results for input(s): INR, PROTIME in the last 168 hours. Cardiac Enzymes: No results for input(s): CKTOTAL, CKMB, CKMBINDEX, TROPONINI in the last 168 hours. BNP (last 3 results) No results for input(s): PROBNP in the last 8760 hours. HbA1C: No results for input(s): HGBA1C in the last 72 hours. CBG: No results for input(s): GLUCAP in the last 168 hours. Lipid Profile: No results for input(s): CHOL, HDL, LDLCALC, TRIG, CHOLHDL, LDLDIRECT in the last 72 hours.  Thyroid Function Tests: No results for input(s): TSH, T4TOTAL, FREET4, T3FREE, THYROIDAB in the last 72 hours. Anemia Panel: No results for input(s): VITAMINB12, FOLATE, FERRITIN, TIBC, IRON, RETICCTPCT in the last 72 hours. Urine analysis:    Component Value Date/Time   COLORURINE YELLOW 02/21/2019 1257   APPEARANCEUR CLEAR 02/21/2019 1257   LABSPEC 1.019 02/21/2019 1257   PHURINE 5.0 02/21/2019 1257   GLUCOSEU NEGATIVE 02/21/2019 1257   HGBUR SMALL (A) 02/21/2019 1257   BILIRUBINUR NEGATIVE 02/21/2019 1257   KETONESUR 5 (A) 02/21/2019 1257   PROTEINUR NEGATIVE 02/21/2019 1257   NITRITE NEGATIVE 02/21/2019 1257   LEUKOCYTESUR NEGATIVE 02/21/2019 1257   Sepsis Labs: @LABRCNTIP (procalcitonin:4,lacticidven:4) ) Recent Results (from the past 240 hour(s))  Urine Culture     Status: Abnormal   Collection Time: 02/19/19  6:12 PM   Specimen: Urine, Random  Result Value Ref Range Status   Specimen Description   Final    URINE, RANDOM Performed at [facility], [place] [address] [place], [place] [address]    Special Requests   Final    NONE Performed at [facility], [place] [address] [place], [place] [address]    Culture (A)  Final    <10,000 COLONIES/mL INSIGNIFICANT GROWTH Performed at [place] [address] [place], [place] [address]    Report Status 02/20/2019 FINAL  Final  SARS Coronavirus 2 [facility] order, Performed in [facility] hospital lab) Nasopharyngeal Nasopharyngeal Swab     Status: Abnormal   Collection Time: 02/28/19  8:17 PM   Specimen: Nasopharyngeal Swab  Result Value Ref Range Status   SARS Coronavirus 2 POSITIVE (A) NEGATIVE Final    Comment: RESULT CALLED TO, READ BACK BY AND VERIFIED WITH: P. PEICKERT,RN 2136 02/28/2019 T. TYSOR (NOTE) If result is NEGATIVE SARS-CoV-2 target nucleic acids are NOT DETECTED. The SARS-CoV-2 RNA is generally detectable in upper and lower  respiratory specimens during the  acute phase of infection. The lowest  concentration of SARS-CoV-2 viral copies this assay can detect is 250  copies / mL. A negative result does not preclude SARS-CoV-2 infection  and should not be used as the sole basis for treatment or other  patient management decisions.  A negative result may occur with  improper specimen collection / handling, submission of specimen other  than nasopharyngeal swab, presence of viral mutation(s) within the  areas targeted by this assay, and inadequate number of viral copies  (<  250 copies / mL). A negative result must be combined with clinical  observations, patient history, and epidemiological information. If result is POSITIVE SARS-CoV-2 target nucleic acids are DETECTED . The SARS-CoV-2 RNA is generally detectable in upper and lower  respiratory specimens during the acute phase of infection.  Positive  results are indicative of active infection with SARS-CoV-2.  Clinical  correlation with patient history and other diagnostic information is  necessary to determine patient infection status.  Positive results do  not rule out bacterial infection or co-infection with other viruses. If result is PRESUMPTIVE POSTIVE SARS-CoV-2 nucleic acids MAY BE PRESENT.   A presumptive positive result was obtained on the submitted specimen  and confirmed on repeat testing.  While 2019 novel coronavirus  (SARS-CoV-2) nucleic acids may be present in the submitted sample  additional confirmatory testing may be necessary for epidemiological  and / or clinical management purposes  to differentiate between  SARS-CoV-2 and other Sarbecovirus currently known to infect humans.  If clinically indicated additional testing with an alternate test  methodology [id number] ) is advised. The SARS-CoV-2 RNA is generally  detectable in upper and lower respiratory specimens during the acute  phase of infection. The expected result is Negative. Fact Sheet for Patients:   StrictlyIdeas.no Fact Sheet for Healthcare Providers: BankingDealers.co.za This test is not yet approved or cleared by the [place] FDA and has been authorized for detection and/or diagnosis of SARS-CoV-2 by FDA under an Emergency Use Authorization (EUA).  This EUA will remain in effect (meaning this test can be used) for the duration of the COVID-19 declaration under Section 564(b)(1) of the Act, 21 U.S.C. section 360bbb-3(b)(1), unless the authorization is terminated or revoked sooner. Performed at [facility], [place] [address], [place], [place] [address]       Radiological Exams on Admission: Dg Chest Portable 1 View  Result Date: 02/28/2019 CLINICAL DATA:  68 year old male with chest pain. EXAM: PORTABLE CHEST 1 VIEW COMPARISON:  Chest radiograph dated 08/18/2018 FINDINGS: There is emphysematous changes of the lungs. No focal consolidation pleural effusion, pneumothorax. The cardiac silhouette is within normal limits. No acute osseous pathology. IMPRESSION: No active disease. Electronically Signed   By: Anner Crete M.D.   On: 02/28/2019 20:24    EKG: Independently reviewed.   Active Problems:   * No active hospital problems. *   Assessment/Plan Pneumonia due to severe acute respiratory syndrome coronavirus 2 (SARS-CoV-2): Admit patient for further assessment and management Supportive care IV steroids IV azithromycin Vitamin C and zinc Inflammatory markers daily Further management depend on hospital course  Possible COPD with exacerbation: IV steroids Combivent inhalers Flovent inhaler Further management will depend on hospital course  Chest pain: Atypical features First high-sensitivity troponin is normal. EKG reveals normal sinus rhythm. Follow serial troponin Further management depend on hospital course.  Chronic kidney disease stage III: This is at baseline. Continue to monitor closely.  History  of pulmonary embolism and DVT: Continue Eliquis  DVT prophylaxis: On Eliquis Code Status: Full code Family Communication:  Disposition Plan: Home eventually Consults called: None Admission status: Inpatient  Time spent: 65 minutes  Dana Allan, MD  [facility] Pager #: [phone] 7PM-7AM contact night coverage as above  02/28/2019, 11:17 PM

## 2019-02-28 NOTE — ED Triage Notes (Signed)
Patient arrived from [facility].   Pt reports onset of sharp midchest pain 50 minutes ago unrelieved by .4 of nitro. Pt reports left arm numbness  Pt normally on 2L and 324 aspirin.

## 2019-02-28 NOTE — ED Provider Notes (Signed)
Emergency Department Provider Note   I have reviewed the triage vital signs and the nursing notes.   HISTORY  Chief Complaint Chest Pain   HPI Gregory Christensen is a 68 y.o. male with PMH reviewed below presents to the emergency department for evaluation of lower sternum pain with shortness of breath.  Patient reports acute onset pain that is pinpoint in the center of the chest.  He reports some discomfort in the left arm as well which is somewhat similar.  He denies numbness despite the triage note describing it that way.  He took nitroglycerin with no improvement and so called EMS.  He remains on his 2 L nasal cannula oxygen and took 324 mg of aspirin prior to arrival.  Patient is anticoagulated with history of prior PE.  He denies any fevers or chills.  He has had increased wheezing.    Past Medical History:  Diagnosis Date  . Acute deep vein thrombosis (DVT) of left lower extremity ([place])   . Acute hypoxemic respiratory failure ([place])   . Anxiety   . Aortic atherosclerosis ([place])   . Arthritis   . Benign prostatic hyperplasia 08/28/2018   UNSPECIFIED WHETHER LOWER URINARY TRACT SYMPTOMS PRESENT  . CAD (coronary artery disease)   . Chronic back pain   . Chronic hip pain   . COPD exacerbation ([place]) 10/18/2017  . Emphysema lung ([place])   . Essential hypertension   . GERD (gastroesophageal reflux disease)   . Gout   . Gross hematuria 09/08/2018  . History of tobacco abuse   . Hypoxia   . Pulmonary embolism ([place]) 05/31/2018  . Renal mass    CONCERNING FOR RENAL CELL CARCINOMA DR. Lovena Neighbours  . Respiratory distress   . Respiratory failure, acute-on-chronic ([place]) 08/03/2018  . Seasonal allergies   . Sleep apnea     Patient Active Problem List   Diagnosis Date Noted  . Renal mass 10/31/2018  . Chest pressure 10/15/2018  . Coronary artery calcification 10/15/2018  . Respiratory failure, acute-on-chronic ([place]) 08/03/2018  . Acute deep vein thrombosis (DVT) of left lower extremity  ([place])   . Acute hypoxemic respiratory failure ([place])   . Pulmonary embolism ([place]) 05/31/2018  . COPD exacerbation ([place]) 10/18/2017  . Hypoxia   . Respiratory distress   . Essential hypertension     Past Surgical History:  Procedure Laterality Date  . CYSTOSCOPY/URETEROSCOPY/HOLMIUM LASER/STENT PLACEMENT Left 10/31/2018   Procedure: CYSTOSCOPY/URETEROSCOPY;  Surgeon: Ceasar Mons, MD;  [facility]: [facility];  Service: Urology;  Laterality: Left;  . HERNIA REPAIR  1977  . MANDIBLE SURGERY  1976   EXTENSIVE  . ROBOT ASSITED LAPAROSCOPIC NEPHROURETERECTOMY Left 10/31/2018   Procedure: XI ROBOT ASSITED LAPAROSCOPIC NEPHROURETERECTOMY;  Surgeon: Ceasar Mons, MD;  [facility];  Service: Urology;  Laterality: Left;  ONLY NEEDS 240 MIN FOR ALL PROCEDURES    Allergies Patient has no known allergies.  Family History  Problem Relation Age of Onset  . Diabetes Mother   . Hypertension Sister   . Hypertension Brother     Social History Social History   Tobacco Use  . Smoking status: Former Smoker    Packs/day: 1.00    Years: 26.00    Pack years: 26.00    Types: Cigarettes    Quit date: 10/18/2017    Years since quitting: 1.3  . Smokeless tobacco: Never Used  Substance Use Topics  . Alcohol use: Yes    Comment: 2 beers a day   . Drug use:  Yes    Types: Flunitrazepam    Review of Systems  Constitutional: No fever/chills Eyes: No visual changes. ENT: No sore throat. Cardiovascular: Positive chest pain. Respiratory: Positive shortness of breath. Gastrointestinal: No abdominal pain.  No nausea, no vomiting.  No diarrhea.  No constipation. Genitourinary: Negative for dysuria. Musculoskeletal: Negative for back pain. Positive left arm pain.  Skin: Negative for rash. Neurological: Negative for headaches, focal weakness or numbness.  10-point ROS otherwise negative.  ____________________________________________   PHYSICAL EXAM:  VITAL SIGNS: ED  Triage Vitals  Enc Vitals Group     BP 02/28/19 1928 135/86     Pulse Rate 02/28/19 1928 84     Resp 02/28/19 1928 20     Temp 02/28/19 1928 98.2 F (36.8 C)     Temp Source 02/28/19 1928 Oral   Constitutional: Alert and oriented. Well appearing and in no acute distress. Eyes: Conjunctivae are normal.  Head: Atraumatic. Nose: No congestion/rhinnorhea. Mouth/Throat: Mucous membranes are moist.  Neck: No stridor.  Cardiovascular: Normal rate, regular rhythm. Good peripheral circulation. Grossly normal heart sounds.   Respiratory: Increased respiratory effort.  No retractions. Lungs with bilateral end-expiratory wheezing.  Gastrointestinal: Soft and nontender. No distention.  Musculoskeletal: No lower extremity tenderness nor edema. No gross deformities of extremities. Neurologic:  Normal speech and language. No gross focal neurologic deficits are appreciated.  Skin:  Skin is warm, dry and intact. No rash noted.  ____________________________________________   LABS (all labs ordered are listed, but only abnormal results are displayed)  Labs Reviewed  SARS CORONAVIRUS 2 (HOSPITAL ORDER, [place] [facility]) - Abnormal; Notable for the following components:      Result Value   SARS Coronavirus 2 POSITIVE (*)    All other components within normal limits  COMPREHENSIVE METABOLIC PANEL - Abnormal; Notable for the following components:   Sodium 134 (*)    CO2 19 (*)    Glucose, Bld 114 (*)    Creatinine, Ser 2.04 (*)    Calcium 8.8 (*)    GFR calc non Af Amer 33 (*)    GFR calc Af Amer 38 (*)    All other components within normal limits  CBC WITH DIFFERENTIAL/PLATELET - Abnormal; Notable for the following components:   WBC 3.1 (*)    Hemoglobin 10.4 (*)    HCT 34.8 (*)    MCV 79.1 (*)    MCH 23.6 (*)    MCHC 29.9 (*)    RDW 16.8 (*)    Lymphs Abs 0.5 (*)    All other components within normal limits  I-STAT ARTERIAL BLOOD GAS, ED  TROPONIN I (HIGH  SENSITIVITY)  TROPONIN I (HIGH SENSITIVITY)   ____________________________________________  EKG   EKG Interpretation  Date/Time:  Saturday February 28 2019 19:25:28 EDT Ventricular Rate:  84 PR Interval:    QRS Duration: 84 QT Interval:  371 QTC Calculation: 439 R Axis:   24 Text Interpretation:  Sinus rhythm No STEMI  Confirmed by Nanda Quinton [phone]) on 02/28/2019 7:28:47 PM       ____________________________________________  RADIOLOGY  Dg Chest Portable 1 View  Result Date: 02/28/2019 CLINICAL DATA:  68 year old male with chest pain. EXAM: PORTABLE CHEST 1 VIEW COMPARISON:  Chest radiograph dated 08/18/2018 FINDINGS: There is emphysematous changes of the lungs. No focal consolidation pleural effusion, pneumothorax. The cardiac silhouette is within normal limits. No acute osseous pathology. IMPRESSION: No active disease. Electronically Signed   By: Anner Crete M.D.   On: 02/28/2019  20:24    ____________________________________________   PROCEDURES  Procedure(s) performed:   Procedures  CRITICAL CARE Performed by: Margette Fast Total critical care time: 30 minutes Critical care time was exclusive of separately billable procedures and treating other patients. Critical care was necessary to treat or prevent imminent or life-threatening deterioration. Critical care was time spent personally by me on the following activities: development of treatment plan with patient and/or surrogate as well as nursing, discussions with consultants, evaluation of patient's response to treatment, examination of patient, obtaining history from patient or surrogate, ordering and performing treatments and interventions, ordering and review of laboratory studies, ordering and review of radiographic studies, pulse oximetry and re-evaluation of patient's condition.  Nanda Quinton, MD Emergency Medicine  ____________________________________________   INITIAL IMPRESSION / ASSESSMENT AND PLAN  / ED COURSE  Pertinent labs & imaging results that were available during my care of the patient were reviewed by me and considered in my medical decision making (see chart for details).   Patient presents to the emergency department for evaluation of acute onset central chest pain with left arm discomfort.  Patient with associated shortness of breath.  He does have significant wheezing on exam.  No increased oxygen requirement from his baseline.  Patient had recent Myoview stress which was low risk after CT showed coronary calcifications.  He has history of PE and is anticoagulated.  He is compliant with his medications.  Shortness of breath may be more COPD related.  Plan for chest x-ray, COVID test, troponin.  Patient does have history of PE but being anticoagulated this is much less likely.  If symptoms continue may require CTA. Patient high risk overall so will not send d-dimer.   Patient's labs and imaging reviewed.  COVID test is positive.  Afebrile.  Patient continues to have tachypnea.  Plan for obs admit for albuterol overnight and monitoring. Patient at increased risk for deterioration.   Discussed patient's case with Hospitalist, Dr. Marthenia Rolling to request admission. Patient and family (if present) updated with plan. Care transferred to [facility] service.  I reviewed all nursing notes, vitals, pertinent old records, EKGs, labs, imaging (as available).  ____________________________________________  FINAL CLINICAL IMPRESSION(S) / ED DIAGNOSES  Final diagnoses:  COVID-19  SOB (shortness of breath)     MEDICATIONS GIVEN DURING THIS VISIT:  Medications  albuterol (VENTOLIN HFA) 108 (90 Base) MCG/ACT inhaler 6 puff (6 puffs Inhalation Given 02/28/19 2012)  methylPREDNISolone sodium succinate (SOLU-MEDROL) 125 mg/2 mL injection 125 mg (125 mg Intravenous Given 02/28/19 2013)    Note:  This document was prepared using Dragon voice recognition software and may include unintentional dictation  errors.  Nanda Quinton, MD Emergency Medicine    Long, [facility], MD 02/28/19 [phone]

## 2019-03-01 ENCOUNTER — Encounter (HOSPITAL_COMMUNITY): Payer: Self-pay

## 2019-03-01 ENCOUNTER — Inpatient Hospital Stay (HOSPITAL_COMMUNITY): Payer: Medicare HMO

## 2019-03-01 DIAGNOSIS — N184 Chronic kidney disease, stage 4 (severe): Secondary | ICD-10-CM | POA: Diagnosis not present

## 2019-03-01 DIAGNOSIS — J1289 Other viral pneumonia: Secondary | ICD-10-CM

## 2019-03-01 DIAGNOSIS — F419 Anxiety disorder, unspecified: Secondary | ICD-10-CM | POA: Diagnosis not present

## 2019-03-01 DIAGNOSIS — U071 COVID-19: Secondary | ICD-10-CM | POA: Diagnosis not present

## 2019-03-01 DIAGNOSIS — J988 Other specified respiratory disorders: Secondary | ICD-10-CM

## 2019-03-01 DIAGNOSIS — N400 Enlarged prostate without lower urinary tract symptoms: Secondary | ICD-10-CM | POA: Diagnosis not present

## 2019-03-01 DIAGNOSIS — J9601 Acute respiratory failure with hypoxia: Secondary | ICD-10-CM | POA: Diagnosis not present

## 2019-03-01 DIAGNOSIS — I2510 Atherosclerotic heart disease of native coronary artery without angina pectoris: Secondary | ICD-10-CM | POA: Diagnosis not present

## 2019-03-01 DIAGNOSIS — I700 Atherosclerosis of aorta: Secondary | ICD-10-CM | POA: Diagnosis not present

## 2019-03-01 DIAGNOSIS — J440 Chronic obstructive pulmonary disease with acute lower respiratory infection: Secondary | ICD-10-CM | POA: Diagnosis not present

## 2019-03-01 DIAGNOSIS — R0602 Shortness of breath: Secondary | ICD-10-CM

## 2019-03-01 LAB — D-DIMER, QUANTITATIVE
D-Dimer, Quant: 0.68 ug/mL-FEU — ABNORMAL HIGH (ref 0.00–0.50)
D-Dimer, Quant: 0.53 ug/mL-FEU — ABNORMAL HIGH (ref 0.00–0.50)

## 2019-03-01 LAB — C-REACTIVE PROTEIN
CRP: 3.5 mg/dL — ABNORMAL HIGH (ref <1.0)
CRP: 3.1 mg/dL — ABNORMAL HIGH (ref <1.0)

## 2019-03-01 LAB — TROPONIN I (HIGH SENSITIVITY)
Troponin I (High Sensitivity): 4 ng/L (ref <18)
Troponin I (High Sensitivity): 4 ng/L (ref <18)

## 2019-03-01 LAB — PHOSPHORUS: Phosphorus: 2.3 mg/dL — ABNORMAL LOW (ref 2.5–4.6)

## 2019-03-01 LAB — BRAIN NATRIURETIC PEPTIDE
B Natriuretic Peptide: 24.7 pg/mL (ref 0.0–100.0)
B Natriuretic Peptide: 25.3 pg/mL (ref 0.0–100.0)

## 2019-03-01 LAB — FERRITIN
Ferritin: 32 ng/mL (ref 24–336)
Ferritin: 33 ng/mL (ref 24–336)

## 2019-03-01 LAB — ABO/RH: ABO/RH(D): B POS

## 2019-03-01 LAB — PROCALCITONIN
Procalcitonin: <0.1 ng/mL
Procalcitonin: <0.1 ng/mL

## 2019-03-01 LAB — MAGNESIUM: Magnesium: 2.1 mg/dL (ref 1.7–2.4)

## 2019-03-01 LAB — FIBRINOGEN: Fibrinogen: 590 mg/dL — ABNORMAL HIGH (ref 210–475)

## 2019-03-01 MED ORDER — SODIUM CHLORIDE 0.9 % IV SOLN
1.0000 g | INTRAVENOUS | Status: DC
  Administered 2019-03-01 – 2019-03-04 (×4): 1 g via INTRAVENOUS
  Filled 2019-03-01 (×4): qty 10

## 2019-03-01 MED ORDER — METHYLPREDNISOLONE SODIUM SUCC 125 MG IJ SOLR
80.0000 mg | Freq: Two times a day (BID) | INTRAMUSCULAR | Status: DC
  Administered 2019-03-01 (×2): 80 mg via INTRAVENOUS
  Filled 2019-03-01 (×2): qty 2

## 2019-03-01 MED ORDER — ZINC SULFATE 220 (50 ZN) MG PO CAPS
220.0000 mg | ORAL_CAPSULE | Freq: Every day | ORAL | Status: DC
  Administered 2019-03-01 – 2019-03-05 (×5): 220 mg via ORAL
  Filled 2019-03-01 (×6): qty 1

## 2019-03-01 MED ORDER — SODIUM CHLORIDE 0.9 % IV SOLN
500.0000 mg | INTRAVENOUS | Status: DC
  Administered 2019-03-01 – 2019-03-05 (×5): 500 mg via INTRAVENOUS
  Filled 2019-03-01 (×5): qty 500

## 2019-03-01 MED ORDER — METHYLPREDNISOLONE SODIUM SUCC 40 MG IJ SOLR
40.0000 mg | Freq: Four times a day (QID) | INTRAMUSCULAR | Status: DC
  Administered 2019-03-01 (×2): 40 mg via INTRAVENOUS
  Filled 2019-03-01 (×2): qty 1

## 2019-03-01 MED ORDER — HYDRALAZINE HCL 20 MG/ML IJ SOLN
10.0000 mg | Freq: Four times a day (QID) | INTRAMUSCULAR | Status: DC | PRN
  Administered 2019-03-01: 10 mg via INTRAVENOUS
  Filled 2019-03-01 (×2): qty 1

## 2019-03-01 MED ORDER — AMLODIPINE BESYLATE 5 MG PO TABS
2.5000 mg | ORAL_TABLET | Freq: Every day | ORAL | Status: DC
  Administered 2019-03-01 – 2019-03-05 (×5): 2.5 mg via ORAL
  Filled 2019-03-01 (×6): qty 1

## 2019-03-01 MED ORDER — FLUTICASONE PROPIONATE HFA 220 MCG/ACT IN AERO
2.0000 | INHALATION_SPRAY | Freq: Two times a day (BID) | RESPIRATORY_TRACT | Status: DC
  Administered 2019-03-01 – 2019-03-05 (×9): 2 via RESPIRATORY_TRACT
  Filled 2019-03-01: qty 12

## 2019-03-01 MED ORDER — ORAL CARE MOUTH RINSE
15.0000 mL | Freq: Two times a day (BID) | OROMUCOSAL | Status: DC
  Administered 2019-03-03 – 2019-03-05 (×3): 15 mL via OROMUCOSAL

## 2019-03-01 MED ORDER — SODIUM CHLORIDE 0.9 % IV SOLN
100.0000 mg | INTRAVENOUS | Status: DC
  Administered 2019-03-02 – 2019-03-04 (×3): 100 mg via INTRAVENOUS
  Filled 2019-03-01 (×4): qty 20

## 2019-03-01 MED ORDER — METHYLPREDNISOLONE SODIUM SUCC 125 MG IJ SOLR: 60.0000 mg | Freq: Two times a day (BID) | INTRAMUSCULAR | Status: DC

## 2019-03-01 MED ORDER — HYDROCODONE-ACETAMINOPHEN 5-325 MG PO TABS
1.0000 | ORAL_TABLET | ORAL | Status: DC | PRN
  Administered 2019-03-01 – 2019-03-04 (×8): 1 via ORAL
  Filled 2019-03-01 (×8): qty 1

## 2019-03-01 MED ORDER — FLUTICASONE PROPIONATE 50 MCG/ACT NA SUSP
1.0000 | Freq: Two times a day (BID) | NASAL | Status: DC | PRN
  Administered 2019-03-02 – 2019-03-05 (×4): 1 via NASAL
  Filled 2019-03-01: qty 16

## 2019-03-01 MED ORDER — IPRATROPIUM-ALBUTEROL 20-100 MCG/ACT IN AERS
2.0000 | INHALATION_SPRAY | Freq: Four times a day (QID) | RESPIRATORY_TRACT | Status: DC
  Administered 2019-03-01 – 2019-03-05 (×16): 2 via RESPIRATORY_TRACT
  Filled 2019-03-01: qty 4

## 2019-03-01 MED ORDER — SODIUM CHLORIDE 0.9 % IV SOLN
200.0000 mg | Freq: Once | INTRAVENOUS | Status: AC
  Administered 2019-03-01: 200 mg via INTRAVENOUS
  Filled 2019-03-01: qty 40

## 2019-03-01 MED ORDER — PHENOL 1.4 % MT LIQD
1.0000 | OROMUCOSAL | Status: DC | PRN
  Administered 2019-03-01: 1 via OROMUCOSAL
  Filled 2019-03-01: qty 177

## 2019-03-01 MED ORDER — DOCUSATE SODIUM 100 MG PO CAPS
100.0000 mg | ORAL_CAPSULE | Freq: Two times a day (BID) | ORAL | Status: DC
  Administered 2019-03-01 – 2019-03-05 (×8): 100 mg via ORAL
  Filled 2019-03-01 (×8): qty 1

## 2019-03-01 MED ORDER — VITAMIN C 500 MG PO TABS
500.0000 mg | ORAL_TABLET | Freq: Every day | ORAL | Status: DC
  Administered 2019-03-01 – 2019-03-05 (×5): 500 mg via ORAL
  Filled 2019-03-01 (×6): qty 1

## 2019-03-01 MED ORDER — MONTELUKAST SODIUM 10 MG PO TABS
10.0000 mg | ORAL_TABLET | Freq: Every day | ORAL | Status: DC
  Administered 2019-03-01 – 2019-03-04 (×4): 10 mg via ORAL
  Filled 2019-03-01 (×4): qty 1

## 2019-03-01 MED ORDER — APIXABAN 5 MG PO TABS
5.0000 mg | ORAL_TABLET | Freq: Two times a day (BID) | ORAL | Status: DC
  Administered 2019-03-01 – 2019-03-05 (×9): 5 mg via ORAL
  Filled 2019-03-01 (×9): qty 1

## 2019-03-01 MED ORDER — ALBUTEROL SULFATE HFA 108 (90 BASE) MCG/ACT IN AERS
2.0000 | INHALATION_SPRAY | Freq: Four times a day (QID) | RESPIRATORY_TRACT | Status: DC | PRN
  Administered 2019-03-05: 09:00:00 2 via RESPIRATORY_TRACT
  Filled 2019-03-01: qty 6.7

## 2019-03-01 MED ORDER — LORATADINE 10 MG PO TABS
10.0000 mg | ORAL_TABLET | Freq: Every day | ORAL | Status: DC
  Administered 2019-03-01 – 2019-03-05 (×5): 10 mg via ORAL
  Filled 2019-03-01 (×6): qty 1

## 2019-03-01 NOTE — Progress Notes (Signed)
Patient unable to lay flat for CT chest.  States he cant breathe and will pass out if he lays down.  O2 sat 98-100% on 3l/[place] the duration of the transport.  Returned to room.  Back to bed safely.  MD made aware that the CT was not completed.

## 2019-03-01 NOTE — Progress Notes (Signed)
PROGRESS NOTE                                                                                                                                                                                                             Patient Demographics:    Gregory Christensen, is a 68 y.o. male, DOB - [birth year], WUJ:[id number]  Outpatient Primary MD for the patient is Gregory Ruff, MD    LOS - 1  Admit date - 02/28/2019    Chief Complaint  Patient presents with   Chest Pain       Brief Narrative   Patient is a 68 year old African-American male, poor historian, with past medical history significant for COPD, coronary artery disease, chronic kidney disease stage III, DVT and pulmonary embolism, renal mass and hypertension amongst other multiple medical and cardiac problems, apparently according to the patient he has been worked up for shortness of breath for at least 7 to 10 days, he even saw a cardiologist for the same reason.  Came to the ER a few times but was discharged home.  Was eventually diagnosed with COVID-19 pneumonitis and sent here for further treatment.   Subjective:    Gregory Christensen today has, No headache, No chest pain, No abdominal pain - No Nausea, No new weakness tingling or numbness, +ve productive cough and SOB.     Assessment  & Plan :      1. Acute Hypoxic Resp. Failure due to Acute Covid 19 Viral Pneumonitis during the ongoing 2020 Covid 19 Pandemic - currently on 3 lit [place], wheezing and short of breath.  Also has a productive cough.  Says he has been worked up for shortness of breath for 7 to 10 days now in the outpatient setting.  Chest x-ray is less remarkable, I highly suspect he has COVID-19 pneumonitis along with possible bacterial infection.  Will get CT noncontrast chest.  Place him on IV steroids since he is wheezing and does have history of underlying asthma.  Will start Remdisvir plus minus Actemra if  needed.  Actemra off label use - patient was told that if COVID-19 pneumonitis gets worse we might potentially use Actemra off label, she denies any known history of tuberculosis or hepatitis, understands the risks and benefits and wants to proceed with Actemra treatment if required.   [place]  03/01/19 0745  DDIMER 0.68*  FERRITIN 32  CRP 3.5*    Lab Results  Component Value Date   SARSCOV2NAA POSITIVE (A) 02/28/2019     Hepatic Function Latest Ref Rng & Units 02/28/2019 02/19/2019 12/22/2018  Total Protein 6.5 - 8.1 g/dL 7.3 8.7(H) 7.5  Albumin 3.5 - 5.0 g/dL 3.5 4.3 3.7  AST 15 - 41 U/L '30 22 28  '$ ALT 0 - 44 U/L '21 24 22  '$ Alk Phosphatase 38 - 126 U/L 102 124 93  Total Bilirubin 0.3 - 1.2 mg/dL 0.4 0.6 0.2(L)  Bilirubin, Direct 0.1 - 0.5 mg/dL - - -     No results found for: BNP    2.  History of kidney cancer, hydrocele.  Currently stable, had nephrectomy few months ago this year.  Under the care of urology.  Follow with urology post discharge.  3.  CKD 4.  Creatinine baseline around 2, has 1 kidney only.  Monitor.  5.  Asthma with wheezing.  Placed on steroids and monitor.  Chronically on 2 L nasal cannula oxygen which will be continued.  For now he is on 3 L.  6.  ? CAP.  Sputum Gram stain culture, strep pneumo urinary antigen, for now Rocephin and azithromycin and monitor.  7.  History of PE.  On Eliquis.  8.  Hypertension.  On Norvasc continue.  PRN hydralazine added for better control if needed.    Condition - Extremely Guarded  Family Communication  :  None  Code Status :  Full  Diet :   Diet Order            Diet Heart Room service appropriate? Yes; Fluid consistency: Thin; Fluid restriction: 1800 mL Fluid  Diet effective now               Disposition Plan  :  Stay inpt  Consults  :  None  Procedures  :    PUD Prophylaxis : None  DVT Prophylaxis  :  Eliquis  Lab Results  Component Value Date   PLT 185 02/28/2019     Inpatient Medications  Scheduled Meds:  amLODipine  2.5 mg Oral Daily   apixaban  5 mg Oral BID   fluticasone  2 puff Inhalation BID   Ipratropium-Albuterol  2 puff Inhalation QID   methylPREDNISolone (SOLU-MEDROL) injection  80 mg Intravenous Q12H   montelukast  10 mg Oral QHS   vitamin C  500 mg Oral Daily   zinc sulfate  220 mg Oral Daily   Continuous Infusions:  azithromycin Stopped (03/01/19 0807)   cefTRIAXone (ROCEPHIN)  IV     PRN Meds:.albuterol, fluticasone, HYDROcodone-acetaminophen  Antibiotics  :    Anti-infectives (From admission, onward)   Start     Dose/Rate Route Frequency Ordered Stop   03/01/19 1200  cefTRIAXone (ROCEPHIN) 1 g in sodium chloride 0.9 % 100 mL IVPB     1 g 200 mL/hr over 30 Minutes Intravenous Every 24 hours 03/01/19 1109 03/06/19 1159   03/01/19 0534  azithromycin (ZITHROMAX) 500 mg in sodium chloride 0.9 % 250 mL IVPB     500 mg 250 mL/hr over 60 Minutes Intravenous Every 24 hours 03/01/19 0534         Time Spent in minutes  30   Gregory Christensen M.D on 03/01/2019 at 11:17 AM  To page go to [email or web address] - password [email or web address]  [facility] -  Office  [phone]   See all Orders from today for further details  Objective:   Vitals:   03/01/19 0730 03/01/19 0807 03/01/19 0809 03/01/19 1013  BP: (!) 120/99 (!) 159/83 (!) 159/83 (!) 143/85  Pulse:  (!) 102 95 77  Resp: (!) 24 (!) 25 (!) 28 17  Temp:      TempSrc:      SpO2:   96% 100%    Wt Readings from Last 3 Encounters:  02/25/19 77.6 kg  02/21/19 77.6 kg  02/19/19 77.6 kg     Intake/Output Summary (Last 24 hours) at 03/01/2019 1117 Last data filed at 03/01/2019 0807 Gross per 24 hour  Intake 250 ml  Output --  Net 250 ml     Physical Exam - done by Dr Gregory Christensen  Awake Alert, Oriented X 3, No new F.N deficits, Normal affect [place].AT,PERRAL Supple Neck,No JVD, No cervical lymphadenopathy appriciated.  Symmetrical Chest wall movement, Good air  movement bilaterally, wheezing ++ RRR,No Gallops,Rubs or new Murmurs, No Parasternal Heave +ve B.Sounds, Abd Soft, No tenderness, No organomegaly appriciated, No rebound - guarding or rigidity. No Cyanosis, Clubbing or edema, No new Rash or bruise       Data Review:    CBC Recent Christensen  Lab 02/28/19 1929 02/28/19 2326  WBC 3.1*  --   HGB 10.4* 10.9*  HCT 34.8* 32.0*  PLT 185  --   MCV 79.1*  --   MCH 23.6*  --   MCHC 29.9*  --   RDW 16.8*  --   LYMPHSABS 0.5*  --   MONOABS 0.3  --   EOSABS 0.0  --   BASOSABS 0.0  --     Chemistries  Recent Christensen  Lab 02/28/19 1929 02/28/19 2326  NA 134* 136  K 4.3 4.3  CL 103  --   CO2 19*  --   GLUCOSE 114*  --   BUN 16  --   CREATININE 2.04*  --   CALCIUM 8.8*  --   AST 30  --   ALT 21  --   ALKPHOS 102  --   BILITOT 0.4  --    ------------------------------------------------------------------------------------------------------------------ No results for input(s): CHOL, HDL, LDLCALC, TRIG, CHOLHDL, LDLDIRECT in the last 72 hours.  No results found for: HGBA1C ------------------------------------------------------------------------------------------------------------------ No results for input(s): TSH, T4TOTAL, T3FREE, THYROIDAB in the last 72 hours.  Invalid input(s): FREET3  Cardiac Enzymes No results for input(s): CKMB, TROPONINI, MYOGLOBIN in the last 168 hours.  Invalid input(s): CK ------------------------------------------------------------------------------------------------------------------ No results found for: BNP  Micro Results Recent Results (from the past 240 hour(s))  Urine Culture     Status: Abnormal   Collection Time: 02/19/19  6:12 PM   Specimen: Urine, Random  Result Value Ref Range Status   Specimen Description   Final    URINE, RANDOM Performed at [facility] [address], [place], [place] [address]    Special Requests   Final    NONE Performed at [facility], [place] [address] [place], [place] [address]    Culture (A)  Final    <10,000 COLONIES/mL INSIGNIFICANT GROWTH Performed at [facility] [address], [place], [place] [address]    Report Status 02/20/2019 FINAL  Final  SARS Coronavirus 2 [facility] order, Performed in [facility] hospital lab) Nasopharyngeal Nasopharyngeal Swab     Status: Abnormal   Collection Time: 02/28/19  8:17 PM   Specimen: Nasopharyngeal Swab  Result Value Ref Range Status   SARS Coronavirus 2 POSITIVE (A) NEGATIVE Final  Comment: RESULT CALLED TO, READ BACK BY AND VERIFIED WITH: P. PEICKERT,RN 2136 02/28/2019 T. TYSOR (NOTE) If result is NEGATIVE SARS-CoV-2 target nucleic acids are NOT DETECTED. The SARS-CoV-2 RNA is generally detectable in upper and lower  respiratory specimens during the acute phase of infection. The lowest  concentration of SARS-CoV-2 viral copies this assay can detect is 250  copies / mL. A negative result does not preclude SARS-CoV-2 infection  and should not be used as the sole basis for treatment or other  patient management decisions.  A negative result may occur with  improper specimen collection / handling, submission of specimen other  than nasopharyngeal swab, presence of viral mutation(s) within the  areas targeted by this assay, and inadequate number of viral copies  (<250 copies / mL). A negative result must be combined with clinical  observations, patient history, and epidemiological information. If result is POSITIVE SARS-CoV-2 target nucleic acids are DETECTED . The SARS-CoV-2 RNA is generally detectable in upper and lower  respiratory specimens during the acute phase of infection.  Positive  results are indicative of active infection with SARS-CoV-2.  Clinical  correlation with patient history and other diagnostic information is  necessary to determine patient infection status.  Positive results do  not rule out bacterial infection or  co-infection with other viruses. If result is PRESUMPTIVE POSTIVE SARS-CoV-2 nucleic acids MAY BE PRESENT.   A presumptive positive result was obtained on the submitted specimen  and confirmed on repeat testing.  While 2019 novel coronavirus  (SARS-CoV-2) nucleic acids may be present in the submitted sample  additional confirmatory testing may be necessary for epidemiological  and / or clinical management purposes  to differentiate between  SARS-CoV-2 and other Sarbecovirus currently known to infect humans.  If clinically indicated additional testing with an alternate test  methodology [phone] ) is advised. The SARS-CoV-2 RNA is generally  detectable in upper and lower respiratory specimens during the acute  phase of infection. The expected result is Negative. Fact Sheet for Patients:  StrictlyIdeas.no Fact Sheet for Healthcare Providers: BankingDealers.co.za This test is not yet approved or cleared by the [place] FDA and has been authorized for detection and/or diagnosis of SARS-CoV-2 by FDA under an Emergency Use Authorization (EUA).  This EUA will remain in effect (meaning this test can be used) for the duration of the COVID-19 declaration under Section 564(b)(1) of the Act, 21 U.S.C. section 360bbb-3(b)(1), unless the authorization is terminated or revoked sooner. Performed at [facility], [place] [address], [place], [place] [address]     Radiology Reports    Dg Chest Portable 1 View  Result Date: 02/28/2019 CLINICAL DATA:  68 year old male with chest pain. EXAM: PORTABLE CHEST 1 VIEW COMPARISON:  Chest radiograph dated 08/18/2018 FINDINGS: There is emphysematous changes of the lungs. No focal consolidation pleural effusion, pneumothorax. The cardiac silhouette is within normal limits. No acute osseous pathology. IMPRESSION: No active disease. Electronically Signed   By: Anner Crete M.D.   On: 02/28/2019 20:24

## 2019-03-01 NOTE — Progress Notes (Addendum)
Patient was seen earlier this morning prior to transfer to [facility] and he was complaining of some scrotal swelling.  States he was still short of breath but was saturating well on oxygen.  He was wheezing pretty significantly on examination.  He will be transferred the care of Dr. Lala Lund at [facility] for further evaluation and management.

## 2019-03-01 NOTE — Progress Notes (Signed)
This nurse attempted to call patients emergency contact Tiffany with no success on either number call went straight to voicemail.

## 2019-03-01 NOTE — Progress Notes (Signed)
Pt was admitted for COVID. The initial CXR was neg, however, repeat showed LRTI. He is also on O2 support. He would qualify for remdesivir.   Scr 2.04, ALT wnl.   Remdesivir 200mg  IV x1 then 100mg  IV q24 x4 F/u with ALT  Onnie Boer, PharmD, BCIDP, AAHIVP, CPP Infectious Disease Pharmacist 03/01/2019 3:03 PM

## 2019-03-01 NOTE — Plan of Care (Signed)
  Problem: Education: Goal: Knowledge of risk factors and measures for prevention of condition will improve Outcome: Progressing   Problem: Coping: Goal: Psychosocial and spiritual needs will be supported Outcome: Progressing   Problem: Respiratory: Goal: Will maintain a patent airway Outcome: Progressing Goal: Complications related to the disease process, condition or treatment will be avoided or minimized Outcome: Progressing   Problem: Education: Goal: Knowledge of General Education information will improve Description: Including pain rating scale, medication(s)/side effects and non-pharmacologic comfort measures Outcome: Progressing   Problem: Health Behavior/Discharge Planning: Goal: Ability to manage health-related needs will improve Outcome: Progressing   Problem: Clinical Measurements: Goal: Ability to maintain clinical measurements within normal limits will improve Outcome: Progressing Goal: Diagnostic test results will improve Outcome: Progressing Goal: Respiratory complications will improve Outcome: Progressing Goal: Cardiovascular complication will be avoided Outcome: Progressing   Problem: Nutrition: Goal: Adequate nutrition will be maintained Outcome: Progressing   Problem: Safety: Goal: Ability to remain free from injury will improve Outcome: Progressing   Problem: Skin Integrity: Goal: Risk for impaired skin integrity will decrease Outcome: Progressing

## 2019-03-01 NOTE — ED Notes (Signed)
[facility] called for transport. 

## 2019-03-01 NOTE — Progress Notes (Signed)
1013  Admission complete.  Resp e/u.  O2 100% on 2L/[place].  Incentive spirometry and flutter valve given.  Educated on use.  Return demo satisfactory.    1150  Patient O2 sat 95% on home regimen 2lNC.  Per MD patient is de-satting and having difficulty breathing.  Upon assessment O2 sat 98% on 2L/[place] and patient states he is cold that is why he cant breath.  Orders to increase O2 to 3L/[place].  And a warm blanket was provided.

## 2019-03-01 NOTE — Progress Notes (Signed)
MEWS/VS Documentation      03/01/2019 1223 03/01/2019 1300 03/01/2019 1640 03/01/2019 1845   MEWS Score:  1  3  2  2    MEWS Score Color:  Green  Yellow  Yellow  Yellow   Resp:  (!) 21  (!) 22  (!) 21  (!) 28   Pulse:  81  (!) 111  (!) 108  94   BP:  (!) 153/83  (!) 158/76  (!) 174/81  (!) 143/64   Temp:  -  -  (!) 97.5 F (36.4 C)  98 F (36.7 C)   O2 Device:  -  -  Nasal Cannula  Nasal Cannula   O2 Flow Rate (L/min):  -  -  2 L/min  2 L/min

## 2019-03-02 LAB — COMPREHENSIVE METABOLIC PANEL
ALT: 21 U/L (ref 0–44)
AST: 29 U/L (ref 15–41)
Albumin: 3.6 g/dL (ref 3.5–5.0)
Alkaline Phosphatase: 99 U/L (ref 38–126)
Anion gap: 11 (ref 5–15)
BUN: 24 mg/dL — ABNORMAL HIGH (ref 8–23)
CO2: 20 mmol/L — ABNORMAL LOW (ref 22–32)
Calcium: 9 mg/dL (ref 8.9–10.3)
Chloride: 104 mmol/L (ref 98–111)
Creatinine, Ser: 1.62 mg/dL — ABNORMAL HIGH (ref 0.61–1.24)
GFR calc Af Amer: 50 mL/min — ABNORMAL LOW (ref >60)
GFR calc non Af Amer: 43 mL/min — ABNORMAL LOW (ref >60)
Glucose, Bld: 119 mg/dL — ABNORMAL HIGH (ref 70–99)
Potassium: 4.6 mmol/L (ref 3.5–5.1)
Sodium: 135 mmol/L (ref 135–145)
Total Bilirubin: 0.3 mg/dL (ref 0.3–1.2)
Total Protein: 7.7 g/dL (ref 6.5–8.1)

## 2019-03-02 LAB — URINALYSIS, ROUTINE W REFLEX MICROSCOPIC
Bilirubin Urine: NEGATIVE
Glucose, UA: NEGATIVE mg/dL
Ketones, ur: NEGATIVE mg/dL
Leukocytes,Ua: NEGATIVE
Nitrite: NEGATIVE
Protein, ur: 30 mg/dL — AB
Specific Gravity, Urine: 1.019 (ref 1.005–1.030)
pH: 6 (ref 5.0–8.0)

## 2019-03-02 LAB — CBC WITH DIFFERENTIAL/PLATELET
Abs Immature Granulocytes: 0.03 10*3/uL (ref 0.00–0.07)
Basophils Absolute: 0 10*3/uL (ref 0.0–0.1)
Basophils Relative: 0 %
Eosinophils Absolute: 0 10*3/uL (ref 0.0–0.5)
Eosinophils Relative: 0 %
HCT: 32.9 % — ABNORMAL LOW (ref 39.0–52.0)
Hemoglobin: 10 g/dL — ABNORMAL LOW (ref 13.0–17.0)
Immature Granulocytes: 1 %
Lymphocytes Relative: 14 %
Lymphs Abs: 0.5 10*3/uL — ABNORMAL LOW (ref 0.7–4.0)
MCH: 23.8 pg — ABNORMAL LOW (ref 26.0–34.0)
MCHC: 30.4 g/dL (ref 30.0–36.0)
MCV: 78.3 fL — ABNORMAL LOW (ref 80.0–100.0)
Monocytes Absolute: 0.3 10*3/uL (ref 0.1–1.0)
Monocytes Relative: 7 %
Neutro Abs: 2.9 10*3/uL (ref 1.7–7.7)
Neutrophils Relative %: 78 %
Platelets: 229 10*3/uL (ref 150–400)
RBC: 4.2 MIL/uL — ABNORMAL LOW (ref 4.22–5.81)
RDW: 17.2 % — ABNORMAL HIGH (ref 11.5–15.5)
WBC: 3.8 10*3/uL — ABNORMAL LOW (ref 4.0–10.5)
nRBC: 0 % (ref 0.0–0.2)

## 2019-03-02 LAB — FERRITIN: Ferritin: 35 ng/mL (ref 24–336)

## 2019-03-02 LAB — C-REACTIVE PROTEIN: CRP: 1.7 mg/dL — ABNORMAL HIGH (ref <1.0)

## 2019-03-02 LAB — LACTATE DEHYDROGENASE: LDH: 187 U/L (ref 98–192)

## 2019-03-02 LAB — MAGNESIUM: Magnesium: 2.2 mg/dL (ref 1.7–2.4)

## 2019-03-02 LAB — PROCALCITONIN: Procalcitonin: <0.1 ng/mL

## 2019-03-02 LAB — D-DIMER, QUANTITATIVE: D-Dimer, Quant: 0.68 ug/mL-FEU — ABNORMAL HIGH (ref 0.00–0.50)

## 2019-03-02 LAB — BRAIN NATRIURETIC PEPTIDE: B Natriuretic Peptide: 33.9 pg/mL (ref 0.0–100.0)

## 2019-03-02 LAB — PHOSPHORUS: Phosphorus: 2.9 mg/dL (ref 2.5–4.6)

## 2019-03-02 MED ORDER — METHYLPREDNISOLONE SODIUM SUCC 125 MG IJ SOLR
60.0000 mg | Freq: Two times a day (BID) | INTRAMUSCULAR | Status: DC
  Administered 2019-03-02 – 2019-03-04 (×6): 60 mg via INTRAVENOUS
  Filled 2019-03-02 (×7): qty 2

## 2019-03-02 MED ORDER — HYDRALAZINE HCL 50 MG PO TABS
50.0000 mg | ORAL_TABLET | Freq: Three times a day (TID) | ORAL | Status: DC
  Administered 2019-03-02 – 2019-03-05 (×8): 50 mg via ORAL
  Filled 2019-03-02 (×9): qty 1

## 2019-03-02 MED ORDER — ACETAMINOPHEN 325 MG PO TABS
650.0000 mg | ORAL_TABLET | Freq: Four times a day (QID) | ORAL | Status: DC | PRN
  Administered 2019-03-02 – 2019-03-04 (×5): 650 mg via ORAL
  Filled 2019-03-02 (×6): qty 2

## 2019-03-02 NOTE — Progress Notes (Signed)
Called and updated pt's daughter on plan of care.  She appreciated the update.

## 2019-03-02 NOTE — Plan of Care (Signed)
  Problem: Respiratory: Goal: Will maintain a patent airway Outcome: Progressing Goal: Complications related to the disease process, condition or treatment will be avoided or minimized Outcome: Progressing   

## 2019-03-02 NOTE — Progress Notes (Signed)
PROGRESS NOTE                                                                                                                                                                                                             Patient Demographics:    Gregory Christensen, is a 68 y.o. male, DOB - 1950/09/01, SWF:[id number]  Outpatient Primary MD for the patient is Leighton Ruff, MD    LOS - 2  Admit date - 02/28/2019    Chief Complaint  Patient presents with  . Chest Pain       Brief Narrative   Patient is a 68 year old African-American male, poor historian, with past medical history significant for COPD, coronary artery disease, chronic kidney disease stage III, DVT and pulmonary embolism, renal mass and hypertension amongst other multiple medical and cardiac problems, apparently according to the patient he has been worked up for shortness of breath for at least 7 to 10 days, he even saw a cardiologist for the same reason.  Came to the ER a few times but was discharged home.  Was eventually diagnosed with COVID-19 pneumonitis and sent here for further treatment.   Subjective:   Patient in bed, appears comfortable, denies any headache, no fever, no chest pain or pressure, no shortness of breath , no abdominal pain. No focal weakness.  Does complain of some dysuria.   Assessment  & Plan :      1. Acute Hypoxic Resp. Failure due to Acute Covid 19 Viral Pneumonitis during the ongoing 2020 Covid 19 Pandemic - currently on 2 lit [place], wheezing and shortness of breath have improved.  Continues to have some productive cough, apparently he has been worked up for shortness of breath for 7 to 10 days now in the outpatient setting.  Chest x-ray is less remarkable, I highly suspect he has COVID-19 pneumonitis along with possible bacterial infection.  Unfortunately patient could not tolerate CT noncontrast, clinically improved on steroid, Remdisvir and  antibiotic combination which will be continued.  Actemra off label use - patient was told that if COVID-19 pneumonitis gets worse we might potentially use Actemra off label, she denies any known history of tuberculosis or hepatitis, understands the risks and benefits and wants to proceed with Actemra treatment if required.   [place]    03/01/19 0745 03/01/19 1200  03/02/19 0245  DDIMER 0.68* 0.53* 0.68*  FERRITIN 32 33 35  LDH  --   --  187  CRP 3.5* 3.1* 1.7*    Lab Results  Component Value Date   SARSCOV2NAA POSITIVE (A) 02/28/2019     Hepatic Function Latest Ref Rng & Units 03/02/2019 02/28/2019 02/19/2019  Total Protein 6.5 - 8.1 g/dL 7.7 7.3 8.7(H)  Albumin 3.5 - 5.0 g/dL 3.6 3.5 4.3  AST 15 - 41 U/L _0 ALT 0 - 44 U/L _1 Alk Phosphatase 38 - 126 U/L 99 102 124  Total Bilirubin 0.3 - 1.2 mg/dL 0.3 0.4 0.6  Bilirubin, Direct 0.1 - 0.5 mg/dL - - -        Component Value Date/Time   BNP 33.9 03/02/2019 0245      2.  History of kidney cancer, hydrocele.  Currently stable, had nephrectomy few months ago this year.  Under the care of urology.  Follow with urology post discharge.  3.  CKD 4.  Creatinine baseline around 2, has 1 kidney only.  Monitor.  5.  Asthma with wheezing.  Placed on steroids and monitor.  Chronically on 2 L nasal cannula oxygen which will be continued.  For now he is on 3 L.  6.  ? CAP.  Sputum Gram stain culture, strep pneumo urinary antigen, for now Rocephin and azithromycin and monitor.  7.  History of PE.  On Eliquis.  8.  Hypertension.  On Norvasc added scheduled hydralazine along with as needed hydralazine for better control.  9.  Dysuria.  Check bladder scan, UA with urine culture.  Already on Rocephin which will be continued.    Condition - Fair  Family Communication  :  None  Code Status :  Full  Diet :   Diet Order            Diet Heart Room service appropriate? Yes; Fluid consistency: Thin;  Fluid restriction: 1800 mL Fluid  Diet effective now               Disposition Plan  :  Stay inpt  Consults  :  None  Procedures  :    PUD Prophylaxis : None  DVT Prophylaxis  :  Eliquis  Lab Results  Component Value Date   PLT 229 03/02/2019    Inpatient Medications  Scheduled Meds: . amLODipine  2.5 mg Oral Daily  . apixaban  5 mg Oral BID  . docusate sodium  100 mg Oral BID  . fluticasone  2 puff Inhalation BID  . Ipratropium-Albuterol  2 puff Inhalation QID  . loratadine  10 mg Oral Daily  . mouth rinse  15 mL Mouth Rinse BID  . methylPREDNISolone (SOLU-MEDROL) injection  80 mg Intravenous Q12H  . montelukast  10 mg Oral QHS  . vitamin C  500 mg Oral Daily  . zinc sulfate  220 mg Oral Daily   Continuous Infusions: . azithromycin 500 mg (03/02/19 0556)  . cefTRIAXone (ROCEPHIN)  IV Stopped (03/01/19 1252)  . remdesivir 100 mg in NS 250 mL     PRN Meds:.acetaminophen, albuterol, fluticasone, hydrALAZINE, HYDROcodone-acetaminophen, phenol  Antibiotics  :    Anti-infectives (From admission, onward)   Start     Dose/Rate Route Frequency Ordered Stop   03/02/19 1600  remdesivir 100 mg in sodium chloride 0.9 % 250 mL IVPB     100 mg 500 mL/hr over 30 Minutes Intravenous Every 24 hours 03/01/19 1453 03/06/19 1559  03/01/19 1600  remdesivir 200 mg in sodium chloride 0.9 % 250 mL IVPB     200 mg 500 mL/hr over 30 Minutes Intravenous Once 03/01/19 1453 03/01/19 1607   03/01/19 1200  cefTRIAXone (ROCEPHIN) 1 g in sodium chloride 0.9 % 100 mL IVPB     1 g 200 mL/hr over 30 Minutes Intravenous Every 24 hours 03/01/19 1109 03/06/19 1159   03/01/19 0534  azithromycin (ZITHROMAX) 500 mg in sodium chloride 0.9 % 250 mL IVPB     500 mg 250 mL/hr over 60 Minutes Intravenous Every 24 hours 03/01/19 0534         Time Spent in minutes  30   Lala Lund M.D on 03/02/2019 at 9:55 AM  To page go to [email or web address] - password [email or web address]  [facility] -  Office   [phone]   See all Orders from today for further details    Objective:   Vitals:   03/01/19 2000 03/02/19 0000 03/02/19 0615 03/02/19 0858  BP:    (!) 142/89  Pulse: 70  75 88  Resp: 20   (!) 24  Temp:  98 F (36.7 C)  98.5 F (36.9 C)  TempSrc:  Oral  Oral  SpO2: 99%  96% 93%  Weight:      Height:        Wt Readings from Last 3 Encounters:  03/01/19 77.5 kg  02/25/19 77.6 kg  02/21/19 77.6 kg     Intake/Output Summary (Last 24 hours) at 03/02/2019 0955 Last data filed at 03/01/2019 1800 Gross per 24 hour  Intake 710 ml  Output -  Net 710 ml     Physical Exam -  Awake Alert, Oriented X 3, No new F.N deficits, Normal affect Hood.AT,PERRAL Supple Neck,No JVD, No cervical lymphadenopathy appriciated.  Symmetrical Chest wall movement, Good air movement bilaterally, mild wheezing RRR,No Gallops, Rubs or new Murmurs, No Parasternal Heave +ve B.Sounds, Abd Soft, No tenderness, No organomegaly appriciated, No rebound - guarding or rigidity. No Cyanosis, Clubbing or edema, No new Rash or bruise   Data Review:    CBC Recent Labs  Lab 02/28/19 1929 02/28/19 2326 03/02/19 0245  WBC 3.1*  --  3.8*  HGB 10.4* 10.9* 10.0*  HCT 34.8* 32.0* 32.9*  PLT 185  --  229  MCV 79.1*  --  78.3*  MCH 23.6*  --  23.8*  MCHC 29.9*  --  30.4  RDW 16.8*  --  17.2*  LYMPHSABS 0.5*  --  0.5*  MONOABS 0.3  --  0.3  EOSABS 0.0  --  0.0  BASOSABS 0.0  --  0.0    Chemistries  Recent Labs  Lab 02/28/19 1929 02/28/19 2326 03/01/19 1200 03/02/19 0245  NA 134* 136  --  135  K 4.3 4.3  --  4.6  CL 103  --   --  104  CO2 19*  --   --  20*  GLUCOSE 114*  --   --  119*  BUN 16  --   --  24*  CREATININE 2.04*  --   --  1.62*  CALCIUM 8.8*  --   --  9.0  MG  --   --  2.1 2.2  AST 30  --   --  29  ALT 21  --   --  21  ALKPHOS 102  --   --  99  BILITOT 0.4  --   --  0.3    ------------------------------------------------------------------------------------------------------------------ No results for  input(s): CHOL, HDL, LDLCALC, TRIG, CHOLHDL, LDLDIRECT in the last 72 hours.  No results found for: HGBA1C ------------------------------------------------------------------------------------------------------------------ No results for input(s): TSH, T4TOTAL, T3FREE, THYROIDAB in the last 72 hours.  Invalid input(s): FREET3  Cardiac Enzymes No results for input(s): CKMB, TROPONINI, MYOGLOBIN in the last 168 hours.  Invalid input(s): CK ------------------------------------------------------------------------------------------------------------------    Component Value Date/Time   BNP 33.9 03/02/2019 0245    Micro Results Recent Results (from the past 240 hour(s))  SARS Coronavirus 2 [facility] order, Performed in [facility] hospital lab) Nasopharyngeal Nasopharyngeal Swab     Status: Abnormal   Collection Time: 02/28/19  8:17 PM   Specimen: Nasopharyngeal Swab  Result Value Ref Range Status   SARS Coronavirus 2 POSITIVE (A) NEGATIVE Final    Comment: RESULT CALLED TO, READ BACK BY AND VERIFIED WITH: P. PEICKERT,RN 2136 02/28/2019 T. TYSOR (NOTE) If result is NEGATIVE SARS-CoV-2 target nucleic acids are NOT DETECTED. The SARS-CoV-2 RNA is generally detectable in upper and lower  respiratory specimens during the acute phase of infection. The lowest  concentration of SARS-CoV-2 viral copies this assay can detect is 250  copies / mL. A negative result does not preclude SARS-CoV-2 infection  and should not be used as the sole basis for treatment or other  patient management decisions.  A negative result may occur with  improper specimen collection / handling, submission of specimen other  than nasopharyngeal swab, presence of viral mutation(s) within the  areas targeted by this assay, and inadequate number of viral copies  (<250 copies / mL). A negative  result must be combined with clinical  observations, patient history, and epidemiological information. If result is POSITIVE SARS-CoV-2 target nucleic acids are DETECTED . The SARS-CoV-2 RNA is generally detectable in upper and lower  respiratory specimens during the acute phase of infection.  Positive  results are indicative of active infection with SARS-CoV-2.  Clinical  correlation with patient history and other diagnostic information is  necessary to determine patient infection status.  Positive results do  not rule out bacterial infection or co-infection with other viruses. If result is PRESUMPTIVE POSTIVE SARS-CoV-2 nucleic acids MAY BE PRESENT.   A presumptive positive result was obtained on the submitted specimen  and confirmed on repeat testing.  While 2019 novel coronavirus  (SARS-CoV-2) nucleic acids may be present in the submitted sample  additional confirmatory testing may be necessary for epidemiological  and / or clinical management purposes  to differentiate between  SARS-CoV-2 and other Sarbecovirus currently known to infect humans.  If clinically indicated additional testing with an alternate test  methodology [phone] ) is advised. The SARS-CoV-2 RNA is generally  detectable in upper and lower respiratory specimens during the acute  phase of infection. The expected result is Negative. Fact Sheet for Patients:  StrictlyIdeas.no Fact Sheet for Healthcare Providers: BankingDealers.co.za This test is not yet approved or cleared by the [place] FDA and has been authorized for detection and/or diagnosis of SARS-CoV-2 by FDA under an Emergency Use Authorization (EUA).  This EUA will remain in effect (meaning this test can be used) for the duration of the COVID-19 declaration under Section 564(b)(1) of the Act, 21 U.S.C. section 360bbb-3(b)(1), unless the authorization is terminated or revoked sooner. Performed at [facility], [place] [address], [place], [place] [address]     Radiology Reports    Dg Chest Portable 1 View  Result Date: 02/28/2019 CLINICAL DATA:  68 year old male with chest pain. EXAM: PORTABLE CHEST 1 VIEW  COMPARISON:  Chest radiograph dated 08/18/2018 FINDINGS: There is emphysematous changes of the lungs. No focal consolidation pleural effusion, pneumothorax. The cardiac silhouette is within normal limits. No acute osseous pathology. IMPRESSION: No active disease. Electronically Signed   By: Anner Crete M.D.   On: 02/28/2019 20:24

## 2019-03-02 NOTE — Evaluation (Signed)
Physical Therapy Evaluation Patient Details Name: Gregory Christensen MRN: [id number] DOB: [birth year] Today's Date: 03/02/2019   History of Present Illness  68 year old African-American male, poor historian, with past medical history significant for COPD, coronary artery disease, renal cancer with nephrectomy, chronic kidney disease stage III, anxiety, arthritis, chronic hip & back pain, gout, DVT and pulmonary embolism, renal mass and hypertension amongst other multiple medical and cardiac problems.  Presented to ED 8/29 with SOB, fever, chills, cough and found to be +COVID. First dose of remdesivir 8/30 (5 doses planned)  Clinical Impression   Pt admitted with above diagnosis. Patient was ambulating only short household distances most days. Ambulating well in room with HR 92-98 bpm; SpO2 93-96% on 2L. When attempted to walk outside room and don a mask, he became very anxious with HR up to 126 bpm, with sats stable at 96% on 2L. After episode of anxiety, pt required min assist to walk back to chair with pt reaching to hold onto objects.  Pt currently with functional limitations due to the deficits listed below (see PT Problem List). Pt will benefit from skilled PT to increase their independence and safety with mobility to allow discharge to the venue listed below.       Follow Up Recommendations No PT follow up    Equipment Recommendations  None recommended by PT    Recommendations for Other Services OT consult     Precautions / Restrictions Precautions Precautions: Other (comment) Precaution Comments: monitor sats; uses 2L O2 at home; became very anxious with mask over nose      Mobility  Bed Mobility               General bed mobility comments: up in chair  Transfers Overall transfer level: Modified independent Equipment used: None             General transfer comment: good awareness of lines--even during pivot to [facility]  Ambulation/Gait Ambulation/Gait assistance: Min  guard Gait Distance (Feet): 65 Feet(no mask; after attempted mask, 15 ft return to chair) Assistive device: None Gait Pattern/deviations: Step-through pattern;Decreased stride length     General Gait Details: close guarding for safety; pt not reaching for support surfaces until after tried to use mask to walk in hall. He wore it 10 seconds and had to remove; HHA on return to chair. SpO2 stable throughout  Stairs            Wheelchair Mobility    Modified Rankin (Stroke Patients Only)       Balance Overall balance assessment: Mild deficits observed, not formally tested                                           Pertinent Vitals/Pain Pain Assessment: No/denies pain    Home Living Family/patient expects to be discharged to:: Private residence Living Arrangements: Parent(92 yo mother) Available Help at Discharge: Family;Available PRN/intermittently Type of Home: House Home Access: Stairs to enter Entrance Stairs-Rails: None Entrance Stairs-Number of Steps: 2 Home Layout: One level Home Equipment: Shower seat;Cane - single point;Walker - 2 wheels;Walker - 4 wheels;Bedside commode;Wheelchair - manual(most given to his mom (she does not use)) Additional Comments: home O2 at 2L     Prior Function Level of Independence: Independent with assistive device(s)         Comments: uses cane sometimes when he goes out. can walk without it.  Hand Dominance        Extremity/Trunk Assessment   Upper Extremity Assessment Upper Extremity Assessment: Generalized weakness    Lower Extremity Assessment Lower Extremity Assessment: Generalized weakness    Cervical / Trunk Assessment Cervical / Trunk Assessment: Normal  Communication   Communication: No difficulties  Cognition Arousal/Alertness: Awake/alert Behavior During Therapy: WFL for tasks assessed/performed;Anxious(WFL until donned simple mask, then very anxious) Overall Cognitive Status: Within  Functional Limits for tasks assessed                                        General Comments      Exercises     Assessment/Plan    PT Assessment Patient needs continued PT services  PT Problem List Decreased strength;Decreased activity tolerance;Decreased balance;Decreased mobility;Decreased knowledge of use of DME;Decreased knowledge of precautions;Cardiopulmonary status limiting activity       PT Treatment Interventions DME instruction;Gait training;Functional mobility training;Therapeutic activities;Therapeutic exercise;Balance training;Patient/family education    PT Goals (Current goals can be found in the Care Plan section)  Acute Rehab PT Goals Patient Stated Goal: be ready to go home when MD oks PT Goal Formulation: With patient Time For Goal Achievement: 03/16/19 Potential to Achieve Goals: Good    Frequency Min 3X/week   Barriers to discharge Decreased caregiver support lives with 74yo mother    Co-evaluation               AM-PAC PT "6 Clicks" Mobility  Outcome Measure Help needed turning from your back to your side while in a flat bed without using bedrails?: None Help needed moving from lying on your back to sitting on the side of a flat bed without using bedrails?: None Help needed moving to and from a bed to a chair (including a wheelchair)?: None Help needed standing up from a chair using your arms (e.g., wheelchair or bedside chair)?: None Help needed to walk in hospital room?: A Little Help needed climbing 3-5 steps with a railing? : A Little 6 Click Score: 22    End of Session Equipment Utilized During Treatment: Oxygen Activity Tolerance: Treatment limited secondary to medical complications (Comment)(became anxious w/ mask; HR 92-126 bpm) Patient left: with call bell/phone within reach;in chair Nurse Communication: Mobility status PT Visit Diagnosis: Other abnormalities of gait and mobility (R26.89);Difficulty in walking, not  elsewhere classified (R26.2)    Time: 8841-6606 PT Time Calculation (min) (ACUTE ONLY): 34 min   Charges:   PT Evaluation $PT Eval Moderate Complexity: 1 Mod PT Treatments $Self Care/Home Management: 8-22          Barry Brunner, PT      Rexanne Mano 03/02/2019, 1:16 PM

## 2019-03-02 NOTE — Progress Notes (Signed)
Updated patients daughter Jonelle Sidle on patient status and plan of care. All questions currently answered at this time.

## 2019-03-03 LAB — CBC WITH DIFFERENTIAL/PLATELET
Abs Immature Granulocytes: 0.04 10*3/uL (ref 0.00–0.07)
Basophils Absolute: 0 10*3/uL (ref 0.0–0.1)
Basophils Relative: 0 %
Eosinophils Absolute: 0 10*3/uL (ref 0.0–0.5)
Eosinophils Relative: 0 %
HCT: 34 % — ABNORMAL LOW (ref 39.0–52.0)
Hemoglobin: 10.3 g/dL — ABNORMAL LOW (ref 13.0–17.0)
Immature Granulocytes: 1 %
Lymphocytes Relative: 11 %
Lymphs Abs: 0.5 10*3/uL — ABNORMAL LOW (ref 0.7–4.0)
MCH: 23.8 pg — ABNORMAL LOW (ref 26.0–34.0)
MCHC: 30.3 g/dL (ref 30.0–36.0)
MCV: 78.5 fL — ABNORMAL LOW (ref 80.0–100.0)
Monocytes Absolute: 0.3 10*3/uL (ref 0.1–1.0)
Monocytes Relative: 6 %
Neutro Abs: 4.2 10*3/uL (ref 1.7–7.7)
Neutrophils Relative %: 82 %
Platelets: 302 10*3/uL (ref 150–400)
RBC: 4.33 MIL/uL (ref 4.22–5.81)
RDW: 17.4 % — ABNORMAL HIGH (ref 11.5–15.5)
WBC: 5.1 10*3/uL (ref 4.0–10.5)
nRBC: 0 % (ref 0.0–0.2)

## 2019-03-03 LAB — PROCALCITONIN: Procalcitonin: <0.1 ng/mL

## 2019-03-03 LAB — COMPREHENSIVE METABOLIC PANEL
ALT: 25 U/L (ref 0–44)
AST: 35 U/L (ref 15–41)
Albumin: 3.7 g/dL (ref 3.5–5.0)
Alkaline Phosphatase: 96 U/L (ref 38–126)
Anion gap: 11 (ref 5–15)
BUN: 29 mg/dL — ABNORMAL HIGH (ref 8–23)
CO2: 19 mmol/L — ABNORMAL LOW (ref 22–32)
Calcium: 9.1 mg/dL (ref 8.9–10.3)
Chloride: 106 mmol/L (ref 98–111)
Creatinine, Ser: 1.69 mg/dL — ABNORMAL HIGH (ref 0.61–1.24)
GFR calc Af Amer: 48 mL/min — ABNORMAL LOW (ref >60)
GFR calc non Af Amer: 41 mL/min — ABNORMAL LOW (ref >60)
Glucose, Bld: 135 mg/dL — ABNORMAL HIGH (ref 70–99)
Potassium: 4.6 mmol/L (ref 3.5–5.1)
Sodium: 136 mmol/L (ref 135–145)
Total Bilirubin: 0.3 mg/dL (ref 0.3–1.2)
Total Protein: 7.6 g/dL (ref 6.5–8.1)

## 2019-03-03 LAB — PHOSPHORUS: Phosphorus: 3.1 mg/dL (ref 2.5–4.6)

## 2019-03-03 LAB — MAGNESIUM: Magnesium: 2.5 mg/dL — ABNORMAL HIGH (ref 1.7–2.4)

## 2019-03-03 LAB — LACTATE DEHYDROGENASE: LDH: 215 U/L — ABNORMAL HIGH (ref 98–192)

## 2019-03-03 LAB — FERRITIN: Ferritin: 38 ng/mL (ref 24–336)

## 2019-03-03 LAB — URINE CULTURE: Culture: <10000 — AB

## 2019-03-03 LAB — C-REACTIVE PROTEIN: CRP: <0.8 mg/dL (ref <1.0)

## 2019-03-03 LAB — BRAIN NATRIURETIC PEPTIDE: B Natriuretic Peptide: 35.9 pg/mL (ref 0.0–100.0)

## 2019-03-03 LAB — D-DIMER, QUANTITATIVE: D-Dimer, Quant: 0.43 ug/mL-FEU (ref 0.00–0.50)

## 2019-03-03 MED ORDER — LIP MEDEX EX OINT
TOPICAL_OINTMENT | CUTANEOUS | Status: DC | PRN
  Administered 2019-03-03: 14:00:00 via TOPICAL
  Filled 2019-03-03: qty 7

## 2019-03-03 MED ORDER — TRAMADOL HCL 50 MG PO TABS
50.0000 mg | ORAL_TABLET | Freq: Four times a day (QID) | ORAL | Status: DC | PRN
  Administered 2019-03-03: 50 mg via ORAL
  Filled 2019-03-03: qty 1

## 2019-03-03 MED ORDER — CLONAZEPAM 0.5 MG PO TBDP
0.5000 mg | ORAL_TABLET | Freq: Two times a day (BID) | ORAL | Status: DC | PRN
  Administered 2019-03-03 – 2019-03-04 (×3): 0.5 mg via ORAL
  Filled 2019-03-03 (×3): qty 1

## 2019-03-03 MED ORDER — PROCHLORPERAZINE EDISYLATE 10 MG/2ML IJ SOLN
5.0000 mg | INTRAMUSCULAR | Status: DC | PRN
  Administered 2019-03-03: 5 mg via INTRAVENOUS
  Filled 2019-03-03 (×2): qty 1

## 2019-03-03 NOTE — Progress Notes (Signed)
PROGRESS NOTE                                                                                                                                                                                                             Patient Demographics:    Gregory Christensen, is a 68 y.o. male, DOB - 09-Jul-1950, XBJ:[id number]  Outpatient Primary MD for the patient is Leighton Ruff, MD    LOS - 3  Admit date - 02/28/2019    Chief Complaint  Patient presents with  . Chest Pain       Brief Narrative   Patient is a 68 year old African-American male, poor historian, with past medical history significant for COPD, coronary artery disease, chronic kidney disease stage III, DVT and pulmonary embolism, renal mass and hypertension amongst other multiple medical and cardiac problems, apparently according to the patient he has been worked up for shortness of breath for at least 7 to 10 days, he even saw a cardiologist for the same reason.  Came to the ER a few times but was discharged home.  Was eventually diagnosed with COVID-19 pneumonitis and sent here for further treatment.   Subjective:   Patient in bed, appears comfortable, denies any headache, no fever, no chest pain or pressure, no shortness of breath , no abdominal pain. No focal weakness.   Assessment  & Plan :      1. Acute Hypoxic Resp. Failure due to Acute Covid 19 Viral Pneumonitis during the ongoing 2020 Covid 19 Pandemic - currently on 2 lit [place], wheezing and shortness of breath have improved.  Continues to have some productive cough, apparently he has been worked up for shortness of breath for 7 to 10 days now in the outpatient setting.  Chest x-ray noted, he was started on steroids and Remdisvir combination with good improvement, he is now down to his baseline 2 L nasal cannula oxygen, continue to monitor inflammatory markers which have stabilized, finish IV Remdisvir course and  discharge home once course is finished.  COVID-19 Labs  Recent Labs    03/01/19 1200 03/02/19 0245 03/03/19 0330  DDIMER 0.53* 0.68* 0.43  FERRITIN 33 35 38  LDH  --  187 215*  CRP 3.1* 1.7* <0.8    Lab Results  Component Value Date   SARSCOV2NAA POSITIVE (A) 02/28/2019     Hepatic  Function Latest Ref Rng & Units 03/03/2019 03/02/2019 02/28/2019  Total Protein 6.5 - 8.1 g/dL 7.6 7.7 7.3  Albumin 3.5 - 5.0 g/dL 3.7 3.6 3.5  AST 15 - 41 U/L 35 29 30  ALT 0 - 44 U/L '25 21 21  '$ Alk Phosphatase 38 - 126 U/L 96 99 102  Total Bilirubin 0.3 - 1.2 mg/dL 0.3 0.3 0.4  Bilirubin, Direct 0.1 - 0.5 mg/dL - - -        Component Value Date/Time   BNP 35.9 03/03/2019 0330      2.  History of kidney cancer, hydrocele.  Currently stable, had nephrectomy few months ago this year.  Under the care of urology.  Follow with urology post discharge.  3.  CKD 4.  Creatinine baseline around 2, has 1 kidney only.  Monitor.  5.  Asthma with wheezing.  Placed on steroids and monitor.  Chronically on 2 L nasal cannula oxygen which will be continued.  For now he is on 3 L.  6.  ? CAP.  Sputum Gram stain culture, strep pneumo urinary antigen, for now Rocephin and azithromycin and monitor.  7.  History of PE.  On Eliquis.  8.  Hypertension.  On Norvasc added scheduled hydralazine along with as needed hydralazine for better control.  9.  Dysuria.  Resolved this was temporary, UA and bladder scan were unremarkable.    Condition - Fair  Family Communication  :  None  Code Status :  Full  Diet :   Diet Order            Diet Heart Room service appropriate? Yes; Fluid consistency: Thin; Fluid restriction: 1800 mL Fluid  Diet effective now               Disposition Plan  : Discharge in the next 1 to 2 days.  Consults  :  None  Procedures  :    PUD Prophylaxis : None  DVT Prophylaxis  :  Eliquis  Lab Results  Component Value Date   PLT 302 03/03/2019    Inpatient Medications   Scheduled Meds: . amLODipine  2.5 mg Oral Daily  . apixaban  5 mg Oral BID  . docusate sodium  100 mg Oral BID  . fluticasone  2 puff Inhalation BID  . hydrALAZINE  50 mg Oral Q8H  . Ipratropium-Albuterol  2 puff Inhalation QID  . loratadine  10 mg Oral Daily  . mouth rinse  15 mL Mouth Rinse BID  . methylPREDNISolone (SOLU-MEDROL) injection  60 mg Intravenous Q12H  . montelukast  10 mg Oral QHS  . vitamin C  500 mg Oral Daily  . zinc sulfate  220 mg Oral Daily   Continuous Infusions: . azithromycin 500 mg (03/03/19 0617)  . cefTRIAXone (ROCEPHIN)  IV 1 g (03/02/19 1206)  . remdesivir 100 mg in NS 250 mL 100 mg (03/02/19 1605)   PRN Meds:.acetaminophen, albuterol, fluticasone, hydrALAZINE, HYDROcodone-acetaminophen, phenol, prochlorperazine  Antibiotics  :    Anti-infectives (From admission, onward)   Start     Dose/Rate Route Frequency Ordered Stop   03/02/19 1600  remdesivir 100 mg in sodium chloride 0.9 % 250 mL IVPB     100 mg 500 mL/hr over 30 Minutes Intravenous Every 24 hours 03/01/19 1453 03/06/19 1559   03/01/19 1600  remdesivir 200 mg in sodium chloride 0.9 % 250 mL IVPB     200 mg 500 mL/hr over 30 Minutes Intravenous Once 03/01/19 1453 03/01/19 1607  03/01/19 1200  cefTRIAXone (ROCEPHIN) 1 g in sodium chloride 0.9 % 100 mL IVPB     1 g 200 mL/hr over 30 Minutes Intravenous Every 24 hours 03/01/19 1109 03/06/19 1159   03/01/19 0534  azithromycin (ZITHROMAX) 500 mg in sodium chloride 0.9 % 250 mL IVPB     500 mg 250 mL/hr over 60 Minutes Intravenous Every 24 hours 03/01/19 0534         Time Spent in minutes  30   Lala Lund M.D on 03/03/2019 at 10:27 AM  To page go to [email or web address] - password [email or web address]  [facility] -  Office  [phone]   See all Orders from today for further details    Objective:   Vitals:   03/03/19 0328 03/03/19 0558 03/03/19 0600 03/03/19 0808  BP: (!) 143/77 (!) 130/52 (!) 130/52 (!) 142/66  Pulse: 80 62 (!) 56 75   Resp: '18  17 19  '$ Temp: 98.2 F (36.8 C)   98.1 F (36.7 C)  TempSrc: Oral   Oral  SpO2: 99%   98%  Weight:      Height:        Wt Readings from Last 3 Encounters:  03/01/19 77.5 kg  02/25/19 77.6 kg  02/21/19 77.6 kg     Intake/Output Summary (Last 24 hours) at 03/03/2019 1027 Last data filed at 03/03/2019 0617 Gross per 24 hour  Intake 400 ml  Output 920 ml  Net -520 ml     Physical Exam -  Awake Alert, Oriented X 3, No new F.N deficits, Normal affect [place].AT,PERRAL Supple Neck,No JVD, No cervical lymphadenopathy appriciated.  Symmetrical Chest wall movement, Good air movement bilaterally, CTAB RRR,No Gallops, Rubs or new Murmurs, No Parasternal Heave +ve B.Sounds, Abd Soft, No tenderness, No organomegaly appriciated, No rebound - guarding or rigidity. No Cyanosis, Clubbing or edema, No new Rash or bruise    Data Review:    CBC Recent Labs  Lab 02/28/19 1929 02/28/19 2326 03/02/19 0245 03/03/19 0330  WBC 3.1*  --  3.8* 5.1  HGB 10.4* 10.9* 10.0* 10.3*  HCT 34.8* 32.0* 32.9* 34.0*  PLT 185  --  229 302  MCV 79.1*  --  78.3* 78.5*  MCH 23.6*  --  23.8* 23.8*  MCHC 29.9*  --  30.4 30.3  RDW 16.8*  --  17.2* 17.4*  LYMPHSABS 0.5*  --  0.5* 0.5*  MONOABS 0.3  --  0.3 0.3  EOSABS 0.0  --  0.0 0.0  BASOSABS 0.0  --  0.0 0.0    Chemistries  Recent Labs  Lab 02/28/19 1929 02/28/19 2326 03/01/19 1200 03/02/19 0245 03/03/19 0330  NA 134* 136  --  135 136  K 4.3 4.3  --  4.6 4.6  CL 103  --   --  104 106  CO2 19*  --   --  20* 19*  GLUCOSE 114*  --   --  119* 135*  BUN 16  --   --  24* 29*  CREATININE 2.04*  --   --  1.62* 1.69*  CALCIUM 8.8*  --   --  9.0 9.1  MG  --   --  2.1 2.2 2.5*  AST 30  --   --  29 35  ALT 21  --   --  21 25  ALKPHOS 102  --   --  99 96  BILITOT 0.4  --   --  0.3 0.3   ------------------------------------------------------------------------------------------------------------------ No results for input(s):  CHOL, HDL,  LDLCALC, TRIG, CHOLHDL, LDLDIRECT in the last 72 hours.  No results found for: HGBA1C ------------------------------------------------------------------------------------------------------------------ No results for input(s): TSH, T4TOTAL, T3FREE, THYROIDAB in the last 72 hours.  Invalid input(s): FREET3  Cardiac Enzymes No results for input(s): CKMB, TROPONINI, MYOGLOBIN in the last 168 hours.  Invalid input(s): CK ------------------------------------------------------------------------------------------------------------------    Component Value Date/Time   BNP 35.9 03/03/2019 0330    Micro Results Recent Results (from the past 240 hour(s))  SARS Coronavirus 2 [facility] order, Performed in [facility] hospital lab) Nasopharyngeal Nasopharyngeal Swab     Status: Abnormal   Collection Time: 02/28/19  8:17 PM   Specimen: Nasopharyngeal Swab  Result Value Ref Range Status   SARS Coronavirus 2 POSITIVE (A) NEGATIVE Final    Comment: RESULT CALLED TO, READ BACK BY AND VERIFIED WITH: P. PEICKERT,RN 2136 02/28/2019 T. TYSOR (NOTE) If result is NEGATIVE SARS-CoV-2 target nucleic acids are NOT DETECTED. The SARS-CoV-2 RNA is generally detectable in upper and lower  respiratory specimens during the acute phase of infection. The lowest  concentration of SARS-CoV-2 viral copies this assay can detect is 250  copies / mL. A negative result does not preclude SARS-CoV-2 infection  and should not be used as the sole basis for treatment or other  patient management decisions.  A negative result may occur with  improper specimen collection / handling, submission of specimen other  than nasopharyngeal swab, presence of viral mutation(s) within the  areas targeted by this assay, and inadequate number of viral copies  (<250 copies / mL). A negative result must be combined with clinical  observations, patient history, and epidemiological information. If result is POSITIVE SARS-CoV-2 target  nucleic acids are DETECTED . The SARS-CoV-2 RNA is generally detectable in upper and lower  respiratory specimens during the acute phase of infection.  Positive  results are indicative of active infection with SARS-CoV-2.  Clinical  correlation with patient history and other diagnostic information is  necessary to determine patient infection status.  Positive results do  not rule out bacterial infection or co-infection with other viruses. If result is PRESUMPTIVE POSTIVE SARS-CoV-2 nucleic acids MAY BE PRESENT.   A presumptive positive result was obtained on the submitted specimen  and confirmed on repeat testing.  While 2019 novel coronavirus  (SARS-CoV-2) nucleic acids may be present in the submitted sample  additional confirmatory testing may be necessary for epidemiological  and / or clinical management purposes  to differentiate between  SARS-CoV-2 and other Sarbecovirus currently known to infect humans.  If clinically indicated additional testing with an alternate test  methodology [id number] ) is advised. The SARS-CoV-2 RNA is generally  detectable in upper and lower respiratory specimens during the acute  phase of infection. The expected result is Negative. Fact Sheet for Patients:  StrictlyIdeas.no Fact Sheet for Healthcare Providers: BankingDealers.co.za This test is not yet approved or cleared by the [place] FDA and has been authorized for detection and/or diagnosis of SARS-CoV-2 by FDA under an Emergency Use Authorization (EUA).  This EUA will remain in effect (meaning this test can be used) for the duration of the COVID-19 declaration under Section 564(b)(1) of the Act, 21 U.S.C. section 360bbb-3(b)(1), unless the authorization is terminated or revoked sooner. Performed at [facility], [place] [address] [place], [place] [address]     Radiology Reports    Dg Chest Portable 1 View  Result Date: 02/28/2019  CLINICAL DATA:  68 year old male with chest pain. EXAM: PORTABLE CHEST 1 VIEW  COMPARISON:  Chest radiograph dated 08/18/2018 FINDINGS: There is emphysematous changes of the lungs. No focal consolidation pleural effusion, pneumothorax. The cardiac silhouette is within normal limits. No acute osseous pathology. IMPRESSION: No active disease. Electronically Signed   By: Anner Crete M.D.   On: 02/28/2019 20:24

## 2019-03-03 NOTE — Progress Notes (Signed)
Occupational Therapy Evaluation Patient Details Name: Gregory Christensen MRN: [id number] DOB: 09-12-1950 Today's Date: 03/03/2019    History of Present Illness 68 year old African-American male, poor historian, with past medical history significant for COPD, coronary artery disease, renal cancer with nephrectomy, chronic kidney disease stage III, anxiety, arthritis, chronic hip & back pain, gout, DVT and pulmonary embolism, renal mass and hypertension amongst other multiple medical and cardiac problems.  Presented to ED 8/29 with SOB, fever, chills, cough and found to be +COVID. First dose of remdesivir 8/30 (5 doses planned)   Clinical Impression   PTA, pt modified independent with ADL and mobility. Lives with his 60 you mother who is "in better shape than me". Pt seen on 3Lwith desat to 89 and RR into the high 30s with activity. Pt appears anxious throughout ADL and mobility - discussed possible use of anti anxiety med with nursing. Pt completed flutter valve and incentive spirometer x 5 each. Able to pull 750 ml using IS. Encouraged pt to prone - pt declined. Will follow acutely to facilitate safe DC home. Do not anticipate OT follow needs.     Follow Up Recommendations  No OT follow up;Supervision - Intermittent    Equipment Recommendations  None recommended by OT    Recommendations for Other Services       Precautions / Restrictions Precautions Precautions: Fall Precaution Comments: monitor satas Restrictions Weight Bearing Restrictions: No      Mobility Bed Mobility Overal bed mobility: Needs Assistance Bed Mobility: Sit to Supine       Sit to supine: Supervision      Transfers Overall transfer level: Needs assistance   Transfers: Sit to/from Stand;Stand Pivot Transfers Sit to Stand: Min guard Stand pivot transfers: Min guard            Balance Overall balance assessment: Needs assistance   Sitting balance-Leahy Scale: Good       Standing balance-Leahy Scale:  Fair                             ADL either performed or assessed with clinical judgement   ADL Overall ADL's : Needs assistance/impaired     Grooming: Set up;Standing   Upper Body Bathing: Set up;Sitting   Lower Body Bathing: Minimal assistance;Sit to/from stand   Upper Body Dressing : Set up;Sitting   Lower Body Dressing: Minimal assistance;Sit to/from stand   Toilet Transfer: Min guard   [place] and Hygiene: Supervision/safety;Sit to/from stand       Functional mobility during ADLs: Min guard;Cueing for safety General ADL Comments: anxious and moving quickly     Vision         Perception     Praxis      Pertinent Vitals/Pain Pain Assessment: No/denies pain     Hand Dominance Right   Extremity/Trunk Assessment Upper Extremity Assessment Upper Extremity Assessment: Generalized weakness   Lower Extremity Assessment Lower Extremity Assessment: Defer to PT evaluation   Cervical / Trunk Assessment Cervical / Trunk Assessment: Kyphotic;Other exceptions(forward head; did not stand up during entire session)   Communication Communication Communication: No difficulties   Cognition Arousal/Alertness: Awake/alert Behavior During Therapy: Anxious Overall Cognitive Status: Within Functional Limits for tasks assessed                                     General Comments  Exercises Exercises: Other exercises Other Exercises Other Exercises: flutter valve x 5 Other Exercises: incentive spirometer x 5   Shoulder Instructions      Home Living Family/patient expects to be discharged to:: Private residence Living Arrangements: Parent Available Help at Discharge: Family;Available PRN/intermittently Type of Home: House Home Access: Stairs to enter [employer] of Steps: 2 Entrance Stairs-Rails: None Home Layout: One level     Bathroom Shower/Tub: Occupational psychologist:  Standard Bathroom Accessibility: Yes How Accessible: Accessible via walker Home Equipment: Shower seat;Cane - single point;Walker - 2 wheels;Walker - 4 wheels;Bedside commode;Wheelchair - manual   Additional Comments: home O2 at 2L       Prior Functioning/Environment Level of Independence: Independent with assistive device(s)        Comments: uses cane sometimes when he goes out. can walk without it.        OT Problem List: Decreased activity tolerance;Decreased safety awareness;Decreased knowledge of use of DME or AE;Cardiopulmonary status limiting activity      OT Treatment/Interventions: Self-care/ADL training;Energy conservation;DME and/or AE instruction;Therapeutic activities;Patient/family education    OT Goals(Current goals can be found in the care plan section) Acute Rehab OT Goals Patient Stated Goal: to get better OT Goal Formulation: With patient Time For Goal Achievement: 03/17/19 Potential to Achieve Goals: Good  OT Frequency: Min 3X/week   Barriers to D/C:            Co-evaluation              AM-PAC OT "6 Clicks" Daily Activity     Outcome Measure Help from another person eating meals?: None Help from another person taking care of personal grooming?: A Little Help from another person toileting, which includes using toliet, bedpan, or urinal?: A Little Help from another person bathing (including washing, rinsing, drying)?: A Little Help from another person to put on and taking off regular upper body clothing?: A Little Help from another person to put on and taking off regular lower body clothing?: A Little 6 Click Score: 19   End of Session Equipment Utilized During Treatment: Oxygen(3L; 1 extender) Nurse Communication: Mobility status  Activity Tolerance: Patient tolerated treatment well Patient left: in bed;with call bell/phone within reach  OT Visit Diagnosis: Unsteadiness on feet (R26.81);Muscle weakness (generalized) (M62.81)                 Time: 0076-2263 OT Time Calculation (min): 24 min Charges:  OT General Charges $OT Visit: 1 Visit OT Evaluation $OT Eval Moderate Complexity: 1 Mod OT Treatments $Self Care/Home Management : 8-22 mins  Maurie Boettcher, OT/L   Acute OT Clinical Specialist [place] Pager [phone] Office [phone]   [facility] 03/03/2019, 5:42 PM

## 2019-03-03 NOTE — Progress Notes (Signed)
Patient c/o right sided chest pain, 7/10. Does not radiate and hurts when pressed and with deep breath.  EKG done.  BP 152/79, HR 78. sats 99% on 2L.  Also c/o dizziness.  Dr. Candiss Norse notified.

## 2019-03-03 NOTE — Progress Notes (Signed)
Telephone call to patient's daughter, Betsy Pries, updated on plan of care and answered all questions.

## 2019-03-04 LAB — CBC WITH DIFFERENTIAL/PLATELET
Abs Immature Granulocytes: 0.04 10*3/uL (ref 0.00–0.07)
Basophils Absolute: 0 10*3/uL (ref 0.0–0.1)
Basophils Relative: 0 %
Eosinophils Absolute: 0 10*3/uL (ref 0.0–0.5)
Eosinophils Relative: 0 %
HCT: 33.5 % — ABNORMAL LOW (ref 39.0–52.0)
Hemoglobin: 10.2 g/dL — ABNORMAL LOW (ref 13.0–17.0)
Immature Granulocytes: 1 %
Lymphocytes Relative: 10 %
Lymphs Abs: 0.6 10*3/uL — ABNORMAL LOW (ref 0.7–4.0)
MCH: 23.7 pg — ABNORMAL LOW (ref 26.0–34.0)
MCHC: 30.4 g/dL (ref 30.0–36.0)
MCV: 77.9 fL — ABNORMAL LOW (ref 80.0–100.0)
Monocytes Absolute: 0.4 10*3/uL (ref 0.1–1.0)
Monocytes Relative: 7 %
Neutro Abs: 5 10*3/uL (ref 1.7–7.7)
Neutrophils Relative %: 82 %
Platelets: 331 10*3/uL (ref 150–400)
RBC: 4.3 MIL/uL (ref 4.22–5.81)
RDW: 17.3 % — ABNORMAL HIGH (ref 11.5–15.5)
WBC: 6.1 10*3/uL (ref 4.0–10.5)
nRBC: 0 % (ref 0.0–0.2)

## 2019-03-04 LAB — COMPREHENSIVE METABOLIC PANEL
ALT: 26 U/L (ref 0–44)
AST: 29 U/L (ref 15–41)
Albumin: 3.7 g/dL (ref 3.5–5.0)
Alkaline Phosphatase: 85 U/L (ref 38–126)
Anion gap: 10 (ref 5–15)
BUN: 30 mg/dL — ABNORMAL HIGH (ref 8–23)
CO2: 22 mmol/L (ref 22–32)
Calcium: 8.8 mg/dL — ABNORMAL LOW (ref 8.9–10.3)
Chloride: 104 mmol/L (ref 98–111)
Creatinine, Ser: 1.68 mg/dL — ABNORMAL HIGH (ref 0.61–1.24)
GFR calc Af Amer: 48 mL/min — ABNORMAL LOW (ref >60)
GFR calc non Af Amer: 41 mL/min — ABNORMAL LOW (ref >60)
Glucose, Bld: 131 mg/dL — ABNORMAL HIGH (ref 70–99)
Potassium: 4.4 mmol/L (ref 3.5–5.1)
Sodium: 136 mmol/L (ref 135–145)
Total Bilirubin: 0.6 mg/dL (ref 0.3–1.2)
Total Protein: 7.6 g/dL (ref 6.5–8.1)

## 2019-03-04 LAB — PHOSPHORUS: Phosphorus: 3.5 mg/dL (ref 2.5–4.6)

## 2019-03-04 LAB — LACTATE DEHYDROGENASE: LDH: 209 U/L — ABNORMAL HIGH (ref 98–192)

## 2019-03-04 LAB — C-REACTIVE PROTEIN: CRP: <0.8 mg/dL (ref <1.0)

## 2019-03-04 LAB — D-DIMER, QUANTITATIVE: D-Dimer, Quant: 0.32 ug/mL-FEU (ref 0.00–0.50)

## 2019-03-04 LAB — MAGNESIUM: Magnesium: 2.6 mg/dL — ABNORMAL HIGH (ref 1.7–2.4)

## 2019-03-04 LAB — FERRITIN: Ferritin: 32 ng/mL (ref 24–336)

## 2019-03-04 LAB — BRAIN NATRIURETIC PEPTIDE: B Natriuretic Peptide: 21 pg/mL (ref 0.0–100.0)

## 2019-03-04 LAB — PROCALCITONIN: Procalcitonin: <0.1 ng/mL

## 2019-03-04 NOTE — Progress Notes (Signed)
PT Cancellation Note  Patient Details Name: Gregory Christensen MRN: [id number] DOB: 1950/11/25   Cancelled Treatment:    Reason Eval/Treat Not Completed: Patient declined, stating he was staying in bed (had already been up walking in room, per pt; RN could not confirm) and that he plans to go home tomorrow. Attempted to persuade patient to get OOB and ambulate and he continued to refuse.     Barry Brunner, PT       Rexanne Mano 03/04/2019, 5:05 PM

## 2019-03-04 NOTE — Plan of Care (Signed)
POC reviewed

## 2019-03-04 NOTE — Consult Note (Signed)
   [facility] CM Inpatient Consult   03/04/2019  Gregory Christensen 12-Oct-1950 [id number]    Patient was checked for potential need of [place] [facility] services with a 37% extreme high risk score for unplanned readmission,2 hospitalizations and 3 ED visits in past 6 months, under his [facility] Medicare benefits.  Per MD note, patient was admitted for Acute Hypoxic Respiratory Failure due to Acute Covid 19 Viral Pneumonitis during the ongoing 2020 Covid 19 Pandemic- currently on 2 liters O2 per [place].    Found on medical record review that patient is currently active with an external care management program [facility] [employer]), who follows and provides his care management needs.  [facility] - Inpatient Care Management services not appropriate at this time.  His primary care provider is Dr. Leighton Ruff with [facility], listed to provide transition of care follow-up   Will sign off.   For questions and additional information, please contact:  Tariq Pernell A. Kobee Medlen, BSN, RN-BC [facility] Liaison Cell: [phone]

## 2019-03-04 NOTE — Progress Notes (Signed)
PROGRESS NOTE                                                                                                                                                                                                             Patient Demographics:    Gregory Christensen, is a 68 y.o. male, DOB - 05-19-1951, NKN:[id number]  Outpatient Primary MD for the patient is Leighton Ruff, MD    LOS - 4  Admit date - 02/28/2019    Chief Complaint  Patient presents with  . Chest Pain       Brief Narrative   Patient is a 68 year old African-American male, poor historian, with past medical history significant for COPD, coronary artery disease, chronic kidney disease stage III, DVT and pulmonary embolism, renal mass and hypertension amongst other multiple medical and cardiac problems, apparently according to the patient he has been worked up for shortness of breath for at least 7 to 10 days, he even saw a cardiologist for the same reason.  Came to the ER a few times but was discharged home.  Was eventually diagnosed with COVID-19 pneumonitis and sent here for further treatment.   Subjective:   Patient in bed, appears comfortable, denies any headache, no fever, no chest pain or pressure, no shortness of breath , no abdominal pain. No focal weakness.   Assessment  & Plan :      1. Acute Hypoxic Resp. Failure due to Acute Covid 19 Viral Pneumonitis during the ongoing 2020 Covid 19 Pandemic - currently on 2 lit [place], wheezing and shortness of breath have improved.  Continues to have some productive cough, apparently he has been worked up for shortness of breath for 7 to 10 days now in the outpatient setting.  Chest x-ray noted, he was started on steroids and Remdisvir combination with good improvement, he is now down to his baseline 2 L nasal cannula oxygen, continue to monitor inflammatory markers which have stabilized, finish IV Remdisvir course and  discharge home once course is finished.  COVID-19 Labs  Recent Labs    03/02/19 0245 03/03/19 0330 03/04/19 0400 03/04/19 0450  DDIMER 0.68* 0.43 0.32  --   FERRITIN 35 38  --  32  LDH 187 215* 209*  --   CRP 1.7* <0.8  --  <0.8    Lab Results  Component Value  Date   SARSCOV2NAA POSITIVE (A) 02/28/2019     Hepatic Function Latest Ref Rng & Units 03/04/2019 03/03/2019 03/02/2019  Total Protein 6.5 - 8.1 g/dL 7.6 7.6 7.7  Albumin 3.5 - 5.0 g/dL 3.7 3.7 3.6  AST 15 - 41 U/L 29 35 29  ALT 0 - 44 U/L '26 25 21  '$ Alk Phosphatase 38 - 126 U/L 85 96 99  Total Bilirubin 0.3 - 1.2 mg/dL 0.6 0.3 0.3  Bilirubin, Direct 0.1 - 0.5 mg/dL - - -        Component Value Date/Time   BNP 21.0 03/04/2019 0450      2.  History of kidney cancer, hydrocele.  Currently stable, had nephrectomy few months ago this year.  Under the care of urology.  Follow with urology post discharge.  3.  CKD 4.  Creatinine baseline around 2, has 1 kidney only.  Monitor.  5.  Asthma with wheezing.  Placed on steroids and monitor.  Chronically on 2 L nasal cannula oxygen which will be continued.  For now he is on 3 L.  6.  ? CAP.  Sputum Gram stain culture, strep pneumo urinary antigen, for now Rocephin and azithromycin and monitor.  7.  History of PE.  On Eliquis.  8.  Hypertension.  On Norvasc added scheduled hydralazine along with as needed hydralazine for better control.  9.  Dysuria.  Resolved this was temporary, UA and bladder scan were unremarkable.    Condition - Fair  Family Communication  :  None  Code Status :  Full  Diet :   Diet Order            Diet Heart Room service appropriate? Yes; Fluid consistency: Thin; Fluid restriction: 1800 mL Fluid  Diet effective now               Disposition Plan  : Discharge in am.  Consults  :  None  Procedures  :    PUD Prophylaxis : None  DVT Prophylaxis  :  Eliquis  Lab Results  Component Value Date   PLT 331 03/04/2019    Inpatient  Medications  Scheduled Meds: . amLODipine  2.5 mg Oral Daily  . apixaban  5 mg Oral BID  . docusate sodium  100 mg Oral BID  . fluticasone  2 puff Inhalation BID  . hydrALAZINE  50 mg Oral Q8H  . Ipratropium-Albuterol  2 puff Inhalation QID  . loratadine  10 mg Oral Daily  . mouth rinse  15 mL Mouth Rinse BID  . methylPREDNISolone (SOLU-MEDROL) injection  60 mg Intravenous Q12H  . montelukast  10 mg Oral QHS  . vitamin C  500 mg Oral Daily  . zinc sulfate  220 mg Oral Daily   Continuous Infusions: . azithromycin 500 mg (03/04/19 0515)  . cefTRIAXone (ROCEPHIN)  IV Stopped (03/03/19 1217)  . remdesivir 100 mg in NS 250 mL Stopped (03/03/19 1717)   PRN Meds:.acetaminophen, albuterol, clonazepam, fluticasone, hydrALAZINE, HYDROcodone-acetaminophen, lip balm, phenol, prochlorperazine, traMADol  Antibiotics  :    Anti-infectives (From admission, onward)   Start     Dose/Rate Route Frequency Ordered Stop   03/02/19 1600  remdesivir 100 mg in sodium chloride 0.9 % 250 mL IVPB     100 mg 500 mL/hr over 30 Minutes Intravenous Every 24 hours 03/01/19 1453 03/06/19 1559   03/01/19 1600  remdesivir 200 mg in sodium chloride 0.9 % 250 mL IVPB     200 mg 500 mL/hr over  30 Minutes Intravenous Once 03/01/19 1453 03/01/19 1607   03/01/19 1200  cefTRIAXone (ROCEPHIN) 1 g in sodium chloride 0.9 % 100 mL IVPB     1 g 200 mL/hr over 30 Minutes Intravenous Every 24 hours 03/01/19 1109 03/06/19 1159   03/01/19 0534  azithromycin (ZITHROMAX) 500 mg in sodium chloride 0.9 % 250 mL IVPB     500 mg 250 mL/hr over 60 Minutes Intravenous Every 24 hours 03/01/19 0534         Time Spent in minutes  30   Lala Lund M.D on 03/04/2019 at 9:28 AM  To page go to [email or web address] - password [facility]  [facility] -  Office  [phone]   See all Orders from today for further details    Objective:   Vitals:   03/04/19 0103 03/04/19 0506 03/04/19 0514 03/04/19 0851  BP: (!) 119/58 120/61 120/61    Pulse: 84 73    Resp: 14 (!) 22    Temp:    98.3 F (36.8 C)  TempSrc:    Oral  SpO2: 98% 98%    Weight:      Height:        Wt Readings from Last 3 Encounters:  03/01/19 77.5 kg  02/25/19 77.6 kg  02/21/19 77.6 kg     Intake/Output Summary (Last 24 hours) at 03/04/2019 0928 Last data filed at 03/04/2019 0515 Gross per 24 hour  Intake 1465 ml  Output 1100 ml  Net 365 ml     Physical Exam -  Awake Alert, Oriented X 3, No new F.N deficits, Normal affect Shel[place].AT,PERRAL Supple Neck,No JVD, No cervical lymphadenopathy appriciated.  Symmetrical Chest wall movement, Good air movement bilaterally, few wheezes RRR,No Gallops, Rubs or new Murmurs, No Parasternal Heave +ve B.Sounds, Abd Soft, No tenderness, No organomegaly appriciated, No rebound - guarding or rigidity. No Cyanosis, Clubbing or edema, No new Rash or bruise    Data Review:    CBC Recent Labs  Lab 02/28/19 1929 02/28/19 2326 03/02/19 0245 03/03/19 0330 03/04/19 0400  WBC 3.1*  --  3.8* 5.1 6.1  HGB 10.4* 10.9* 10.0* 10.3* 10.2*  HCT 34.8* 32.0* 32.9* 34.0* 33.5*  PLT 185  --  229 302 331  MCV 79.1*  --  78.3* 78.5* 77.9*  MCH 23.6*  --  23.8* 23.8* 23.7*  MCHC 29.9*  --  30.4 30.3 30.4  RDW 16.8*  --  17.2* 17.4* 17.3*  LYMPHSABS 0.5*  --  0.5* 0.5* 0.6*  MONOABS 0.3  --  0.3 0.3 0.4  EOSABS 0.0  --  0.0 0.0 0.0  BASOSABS 0.0  --  0.0 0.0 0.0    Chemistries  Recent Labs  Lab 02/28/19 1929 02/28/19 2326 03/01/19 1200 03/02/19 0245 03/03/19 0330 03/04/19 0400  NA 134* 136  --  135 136 136  K 4.3 4.3  --  4.6 4.6 4.4  CL 103  --   --  104 106 104  CO2 19*  --   --  20* 19* 22  GLUCOSE 114*  --   --  119* 135* 131*  BUN 16  --   --  24* 29* 30*  CREATININE 2.04*  --   --  1.62* 1.69* 1.68*  CALCIUM 8.8*  --   --  9.0 9.1 8.8*  MG  --   --  2.1 2.2 2.5* 2.6*  AST 30  --   --  29 35 29  ALT 21  --   --  $'21 25 26  'c$ ALKPHOS 102  --   --  99 96 85  BILITOT 0.4  --   --  0.3 0.3 0.6    ------------------------------------------------------------------------------------------------------------------ No results for input(s): CHOL, HDL, LDLCALC, TRIG, CHOLHDL, LDLDIRECT in the last 72 hours.  No results found for: HGBA1C ------------------------------------------------------------------------------------------------------------------ No results for input(s): TSH, T4TOTAL, T3FREE, THYROIDAB in the last 72 hours.  Invalid input(s): FREET3  Cardiac Enzymes No results for input(s): CKMB, TROPONINI, MYOGLOBIN in the last 168 hours.  Invalid input(s): CK ------------------------------------------------------------------------------------------------------------------    Component Value Date/Time   BNP 21.0 03/04/2019 0450    Micro Results Recent Results (from the past 240 hour(s))  SARS Coronavirus 2 [facility] order, Performed in [facility] hospital lab) Nasopharyngeal Nasopharyngeal Swab     Status: Abnormal   Collection Time: 02/28/19  8:17 PM   Specimen: Nasopharyngeal Swab  Result Value Ref Range Status   SARS Coronavirus 2 POSITIVE (A) NEGATIVE Final    Comment: RESULT CALLED TO, READ BACK BY AND VERIFIED WITH: P. PEICKERT,RN 2136 02/28/2019 T. TYSOR (NOTE) If result is NEGATIVE SARS-CoV-2 target nucleic acids are NOT DETECTED. The SARS-CoV-2 RNA is generally detectable in upper and lower  respiratory specimens during the acute phase of infection. The lowest  concentration of SARS-CoV-2 viral copies this assay can detect is 250  copies / mL. A negative result does not preclude SARS-CoV-2 infection  and should not be used as the sole basis for treatment or other  patient management decisions.  A negative result may occur with  improper specimen collection / handling, submission of specimen other  than nasopharyngeal swab, presence of viral mutation(s) within the  areas targeted by this assay, and inadequate number of viral copies  (<250 copies / mL). A negative  result must be combined with clinical  observations, patient history, and epidemiological information. If result is POSITIVE SARS-CoV-2 target nucleic acids are DETECTED . The SARS-CoV-2 RNA is generally detectable in upper and lower  respiratory specimens during the acute phase of infection.  Positive  results are indicative of active infection with SARS-CoV-2.  Clinical  correlation with patient history and other diagnostic information is  necessary to determine patient infection status.  Positive results do  not rule out bacterial infection or co-infection with other viruses. If result is PRESUMPTIVE POSTIVE SARS-CoV-2 nucleic acids MAY BE PRESENT.   A presumptive positive result was obtained on the submitted specimen  and confirmed on repeat testing.  While 2019 novel coronavirus  (SARS-CoV-2) nucleic acids may be present in the submitted sample  additional confirmatory testing may be necessary for epidemiological  and / or clinical management purposes  to differentiate between  SARS-CoV-2 and other Sarbecovirus currently known to infect humans.  If clinically indicated additional testing with an alternate test  methodology [id number] ) is advised. The SARS-CoV-2 RNA is generally  detectable in upper and lower respiratory specimens during the acute  phase of infection. The expected result is Negative. Fact Sheet for Patients:  StrictlyIdeas.no Fact Sheet for Healthcare Providers: BankingDealers.co.za This test is not yet approved or cleared by the [place] FDA and has been authorized for detection and/or diagnosis of SARS-CoV-2 by FDA under an Emergency Use Authorization (EUA).  This EUA will remain in effect (meaning this test can be used) for the duration of the COVID-19 declaration under Section 564(b)(1) of the Act, 21 U.S.C. section 360bbb-3(b)(1), unless the authorization is terminated or revoked sooner. Performed at [facility], [place] [address],  [place], [place] [address]   Culture, Urine     Status: Abnormal   Collection Time: 03/02/19  2:39 PM   Specimen: Urine, Clean Catch  Result Value Ref Range Status   Specimen Description   Final    URINE, CLEAN CATCH Performed at [facility], [place] [address] [place], [place] [address]    Special Requests   Final    NONE Performed at [facility], [place] [address], [place], [place] [address]    Culture (A)  Final    <10,000 COLONIES/mL INSIGNIFICANT GROWTH Performed at [facility] [address], [place], [place] [address]    Report Status 03/03/2019 FINAL  Final    Radiology Reports    Dg Chest Portable 1 View  Result Date: 02/28/2019 CLINICAL DATA:  68 year old male with chest pain. EXAM: PORTABLE CHEST 1 VIEW COMPARISON:  Chest radiograph dated 08/18/2018 FINDINGS: There is emphysematous changes of the lungs. No focal consolidation pleural effusion, pneumothorax. The cardiac silhouette is within normal limits. No acute osseous pathology. IMPRESSION: No active disease. Electronically Signed   By: Anner Crete M.D.   On: 02/28/2019 20:24

## 2019-03-05 MED ORDER — SODIUM CHLORIDE 0.9 % IV SOLN
100.0000 mg | INTRAVENOUS | Status: AC
  Administered 2019-03-05: 100 mg via INTRAVENOUS
  Filled 2019-03-05: qty 20

## 2019-03-05 MED ORDER — METHYLPREDNISOLONE SODIUM SUCC 40 MG IJ SOLR
40.0000 mg | Freq: Every day | INTRAMUSCULAR | Status: DC
  Administered 2019-03-05: 09:00:00 40 mg via INTRAVENOUS
  Filled 2019-03-05: qty 1

## 2019-03-05 MED ORDER — PREDNISONE 5 MG PO TABS: ORAL_TABLET | ORAL | 0 refills | Status: DC

## 2019-03-05 NOTE — Discharge Summary (Signed)
Gregory Christensen MWN:[id number] DOB: [birth year] DOA: 02/28/2019  PCP: Leighton Ruff, MD  Admit date: 02/28/2019  Discharge date: 03/05/2019  Admitted From: Home   Disposition:  Home   Recommendations for Outpatient Follow-up:   Follow up with PCP in 1-2 weeks  PCP Please obtain BMP/CBC, 2 view CXR in 1week,  (see Discharge instructions)   PCP Please follow up on the following pending results:    Home Health: None   Equipment/Devices: None  Consultations: None  Discharge Condition: Stable    CODE STATUS: Full    Diet Recommendation: Heart Healthy     Chief Complaint  Patient presents with  . Chest Pain     Brief history of present illness from the day of admission and additional interim summary    Patient is a 68 year old African-American male, poor historian, with past medical history significant for COPD, coronary artery disease, chronic kidney disease stage III, DVT and pulmonary embolism, renal mass and hypertension amongst other multiple medical and cardiac problems, apparently according to the patient he has been worked up for shortness of breath for at least 7 to 10 days, he even saw a cardiologist for the same reason.  Came to the ER a few times but was discharged home.  Was eventually diagnosed with COVID-19 pneumonitis and sent here for further treatment.                                                                    Hospital Course    1.  1. Acute Hypoxic Resp. Failure due to Acute Covid 19 Viral Pneumonitis during the ongoing 2020 Covid 19 Pandemic - currently on 2 lit [place], wheezing and shortness of breath have improved.  Continues to have some productive cough, apparently he has been worked up for shortness of breath for 7 to 10 days now in the outpatient setting.  Chest x-ray noted, he was  started on steroids and Remdisvir combination with good improvement, he is now down to his baseline 2 L nasal cannula oxygen, which is his baseline and he is symptom-free, wants to go home will be discharged on oral steroid taper along with his home medications, inflammatory markers are stable clinically he is back to his baseline.   COVID-19 Labs  Recent Labs    03/03/19 0330 03/04/19 0400 03/04/19 0450  DDIMER 0.43 0.32  --   FERRITIN 38  --  32  LDH 215* 209*  --   CRP <0.8  --  <0.8    Lab Results  Component Value Date   SARSCOV2NAA POSITIVE (A) 02/28/2019    2.  History of kidney cancer, hydrocele.  Currently stable, had nephrectomy few months ago this year.  Under the care of urology.    Requested to follow with urology post discharge.  3.  CKD 4.  Creatinine baseline around 2, has 1 kidney only.    Currently stable follow with PCP for renal function monitoring.  5.  Asthma with wheezing.  Placed on steroids and monitor.  Chronically on 2 L nasal cannula oxygen which will be continued, he is currently back to his baseline.  6.  ? CAP.    There was suspicion of community-acquired bacterial pneumonia as well on admission and he was given short course of antibiotics which he has finished.  Procalcitonin was unremarkable, clinically less likely that he had a true bacterial infection.  7.  History of PE.  On Eliquis.  8.  Hypertension.  Continue home regimen and follow with PCP.  9.  Dysuria.  Resolved this was temporary, UA and bladder scan were unremarkable.  Coming urology follow-up.    Discharge diagnosis     Active Problems:   Pneumonia due to severe acute respiratory syndrome coronavirus 2 (SARS-CoV-2)    Discharge instructions    Discharge Instructions    Diet - low sodium heart healthy   Complete by: As directed    Discharge instructions   Complete by: As directed    Follow with Primary MD Leighton Ruff, MD in 7 days   Get CBC, CMP, 2 view  Chest X ray -  checked next visit within 1 week by Primary MD    Activity: As tolerated with Full fall precautions use walker/cane & assistance as needed  Disposition Home    Diet: Heart Healthy    Special Instructions: If you have smoked or chewed Tobacco  in the last 2 yrs please stop smoking, stop any regular Alcohol  and or any Recreational drug use.  On your next visit with your primary care physician please Get Medicines reviewed and adjusted.  Please request your Prim.MD to go over all Hospital Tests and Procedure/Radiological results at the follow up, please get all Hospital records sent to your Prim MD by signing hospital release before you go home.  If you experience worsening of your admission symptoms, develop shortness of breath, life threatening emergency, suicidal or homicidal thoughts you must seek medical attention immediately by calling 911 or calling your MD immediately  if symptoms less severe.  You Must read complete instructions/literature along with all the possible adverse reactions/side effects for all the Medicines you take and that have been prescribed to you. Take any new Medicines after you have completely understood and accpet all the possible adverse reactions/side effects.   Increase activity slowly   Complete by: As directed       Discharge Medications   Allergies as of 03/05/2019   No Known Allergies     Medication List    STOP taking these medications   levofloxacin 250 MG tablet Commonly known as: LEVAQUIN     TAKE these medications   acetaminophen 500 MG tablet Commonly known as: TYLENOL Take 1,000 mg by mouth every 6 (six) hours as needed for moderate pain or headache.   albuterol 108 (90 Base) MCG/ACT inhaler Commonly known as: VENTOLIN HFA Inhale 2 puffs into the lungs every 2 (two) hours as needed for wheezing or shortness of breath.   albuterol (2.5 MG/3ML) 0.083% nebulizer solution Commonly known as: PROVENTIL Take 3 mLs (2.5 mg  total) by nebulization every 6 (six) hours as needed for wheezing or shortness of breath.   amLODipine 2.5 MG tablet Commonly known as: NORVASC Take 2.5 mg by mouth daily.   apixaban 5 MG Tabs tablet  Commonly known as: Eliquis Take 1 tablet (5 mg total) by mouth 2 (two) times daily.   budesonide-formoterol 160-4.5 MCG/ACT inhaler Commonly known as: SYMBICORT Inhale 1 puff into the lungs 2 (two) times daily.   docusate sodium 100 MG capsule Commonly known as: COLACE Take 1 capsule (100 mg total) by mouth 2 (two) times daily.   fluticasone 50 MCG/ACT nasal spray Commonly known as: FLONASE Place 1 spray into both nostrils 2 (two) times daily. What changed:   when to take this  reasons to take this   gabapentin 300 MG capsule Commonly known as: NEURONTIN Take 300 mg by mouth 2 (two) times daily.   HYDROcodone-acetaminophen 5-325 MG tablet Commonly known as: NORCO/VICODIN Take 1 tablet by mouth every 4 (four) hours as needed.   ketotifen 0.025 % ophthalmic solution Commonly known as: ZADITOR Place 1 drop into both eyes 2 (two) times daily as needed (allergies).   loratadine 10 MG tablet Commonly known as: CLARITIN Take 10 mg by mouth daily.   montelukast 10 MG tablet Commonly known as: SINGULAIR Take 10 mg by mouth at bedtime.   predniSONE 5 MG tablet Commonly known as: DELTASONE Label  & dispense according to the schedule below. take 8 Pills PO for 3 days, 6 Pills PO for 3 days, 4 Pills PO for 3 days, 2 Pills PO for 3 days, 1 Pills PO for 3 days, 1/2 Pill  PO for 3 days then STOP. Total 65 pills.   Spiriva Respimat 1.25 MCG/ACT Aers Generic drug: Tiotropium Bromide Monohydrate Inhale 2 puffs into the lungs daily.       Follow-up Information    Leighton Ruff, MD. Schedule an appointment as soon as possible for a visit in 1 week(s).   Specialty: Family Medicine Contact information: [place] [address] [phone]        Nahser,  Wonda Cheng, MD. Schedule an appointment as soon as possible for a visit in 2 week(s).   Specialty: Cardiology Contact information: [address] [place] [address] [phone]           Major procedures and Radiology Reports - PLEASE review detailed and final reports thoroughly  -        Dg Chest Port 1 View  Result Date: 03/01/2019 CLINICAL DATA:  Shortness of breath. EXAM: PORTABLE CHEST 1 VIEW COMPARISON:  02/28/2019 FINDINGS: Heart size is normal. Diffuse emphysema. Hazy infiltrate at the left lung base. No dense consolidation, collapse or effusion. IMPRESSION: Emphysema.  Underlying hazy infiltrate at the left base. Electronically Signed   By: Nelson Chimes M.D.   On: 03/01/2019 14:42   Dg Chest Portable 1 View  Result Date: 02/28/2019 CLINICAL DATA:  68 year old male with chest pain. EXAM: PORTABLE CHEST 1 VIEW COMPARISON:  Chest radiograph dated 08/18/2018 FINDINGS: There is emphysematous changes of the lungs. No focal consolidation pleural effusion, pneumothorax. The cardiac silhouette is within normal limits. No acute osseous pathology. IMPRESSION: No active disease. Electronically Signed   By: Anner Crete M.D.   On: 02/28/2019 20:24      Micro Results     Recent Results (from the past 240 hour(s))  SARS Coronavirus 2 [facility] order, Performed in [facility] hospital lab) Nasopharyngeal Nasopharyngeal Swab     Status: Abnormal   Collection Time: 02/28/19  8:17 PM   Specimen: Nasopharyngeal Swab  Result Value Ref Range Status   SARS Coronavirus 2 POSITIVE (A) NEGATIVE Final    Comment: RESULT CALLED TO,  READ BACK BY AND VERIFIED WITH: P. PEICKERT,RN [id number] 02/28/2019 T. TYSOR (NOTE) If result is NEGATIVE SARS-CoV-2 target nucleic acids are NOT DETECTED. The SARS-CoV-2 RNA is generally detectable in upper and lower  respiratory specimens during the acute phase of infection. The lowest  concentration of SARS-CoV-2 viral copies this assay can detect  is 250  copies / mL. A negative result does not preclude SARS-CoV-2 infection  and should not be used as the sole basis for treatment or other  patient management decisions.  A negative result may occur with  improper specimen collection / handling, submission of specimen other  than nasopharyngeal swab, presence of viral mutation(s) within the  areas targeted by this assay, and inadequate number of viral copies  (<250 copies / mL). A negative result must be combined with clinical  observations, patient history, and epidemiological information. If result is POSITIVE SARS-CoV-2 target nucleic acids are DETECTED . The SARS-CoV-2 RNA is generally detectable in upper and lower  respiratory specimens during the acute phase of infection.  Positive  results are indicative of active infection with SARS-CoV-2.  Clinical  correlation with patient history and other diagnostic information is  necessary to determine patient infection status.  Positive results do  not rule out bacterial infection or co-infection with other viruses. If result is PRESUMPTIVE POSTIVE SARS-CoV-2 nucleic acids MAY BE PRESENT.   A presumptive positive result was obtained on the submitted specimen  and confirmed on repeat testing.  While 2019 novel coronavirus  (SARS-CoV-2) nucleic acids may be present in the submitted sample  additional confirmatory testing may be necessary for epidemiological  and / or clinical management purposes  to differentiate between  SARS-CoV-2 and other Sarbecovirus currently known to infect humans.  If clinically indicated additional testing with an alternate test  methodology [phone] ) is advised. The SARS-CoV-2 RNA is generally  detectable in upper and lower respiratory specimens during the acute  phase of infection. The expected result is Negative. Fact Sheet for Patients:  StrictlyIdeas.no Fact Sheet for Healthcare Providers:  BankingDealers.co.za This test is not yet approved or cleared by the [place] FDA and has been authorized for detection and/or diagnosis of SARS-CoV-2 by FDA under an Emergency Use Authorization (EUA).  This EUA will remain in effect (meaning this test can be used) for the duration of the COVID-19 declaration under Section 564(b)(1) of the Act, 21 U.S.C. section 360bbb-3(b)(1), unless the authorization is terminated or revoked sooner. Performed at [facility], [place] [address] [place], [place] [address]   Culture, Urine     Status: Abnormal   Collection Time: 03/02/19  2:39 PM   Specimen: Urine, Clean Catch  Result Value Ref Range Status   Specimen Description   Final    URINE, CLEAN CATCH Performed at [facility], [place] [address], [place], [place] [address]    Special Requests   Final    NONE Performed at [facility], [place] [address], [place], [place] [address]    Culture (A)  Final    <10,000 COLONIES/mL INSIGNIFICANT GROWTH Performed at [place] [address] [place], [place] [address]    Report Status 03/03/2019 FINAL  Final    Today   Subjective    Gregory Christensen today has no headache,no chest abdominal pain,no new weakness tingling or numbness, feels much better wants to go home today.    Objective   Blood pressure 128/72, pulse 74, temperature 98.2 F (36.8 C), temperature source  Oral, resp. rate 20, height 6\' 1"  (1.854 m), weight 77.5 kg, SpO2 100 %.   Intake/Output Summary (Last 24 hours) at 03/05/2019 0949 Last data filed at 03/05/2019 0400 Gross per 24 hour  Intake 429.96 ml  Output 1325 ml  Net -895.04 ml    Exam Awake Alert, Oriented x 3, No new F.N deficits, Normal affect [place].AT,PERRAL Supple Neck,No JVD, No cervical lymphadenopathy appriciated.  Symmetrical Chest wall movement, Good air movement bilaterally, CTAB RRR,No Gallops,Rubs or new Murmurs, No Parasternal Heave  +ve B.Sounds, Abd Soft, Non tender, No organomegaly appriciated, No rebound -guarding or rigidity. No Cyanosis, Clubbing or edema, No new Rash or bruise   Data Review   CBC w Diff:  Lab Results  Component Value Date   WBC 6.1 03/04/2019   HGB 10.2 (L) 03/04/2019   HCT 33.5 (L) 03/04/2019   PLT 331 03/04/2019   LYMPHOPCT 10 03/04/2019   MONOPCT 7 03/04/2019   EOSPCT 0 03/04/2019   BASOPCT 0 03/04/2019    CMP:  Lab Results  Component Value Date   NA 136 03/04/2019   K 4.4 03/04/2019   CL 104 03/04/2019   CO2 22 03/04/2019   BUN 30 (H) 03/04/2019   CREATININE 1.68 (H) 03/04/2019   PROT 7.6 03/04/2019   ALBUMIN 3.7 03/04/2019   BILITOT 0.6 03/04/2019   ALKPHOS 85 03/04/2019   AST 29 03/04/2019   ALT 26 03/04/2019  .   Total Time in preparing paper work, data evaluation and todays exam - 76 minutes  Lala Lund M.D on 03/05/2019 at 9:49 AM  [facility]   Office  [phone]

## 2019-03-05 NOTE — Discharge Instructions (Signed)
Follow with Primary MD Leighton Ruff, MD in 7 days   Get CBC, CMP, 2 view Chest X ray -  checked next visit within 1 week by Primary MD    Activity: As tolerated with Full fall precautions use walker/cane & assistance as needed  Disposition Home    Diet: Heart Healthy    Special Instructions: If you have smoked or chewed Tobacco  in the last 2 yrs please stop smoking, stop any regular Alcohol  and or any Recreational drug use.  On your next visit with your primary care physician please Get Medicines reviewed and adjusted.  Please request your Prim.MD to go over all Hospital Tests and Procedure/Radiological results at the follow up, please get all Hospital records sent to your Prim MD by signing hospital release before you go home.  If you experience worsening of your admission symptoms, develop shortness of breath, life threatening emergency, suicidal or homicidal thoughts you must seek medical attention immediately by calling 911 or calling your MD immediately  if symptoms less severe.  You Must read complete instructions/literature along with all the possible adverse reactions/side effects for all the Medicines you take and that have been prescribed to you. Take any new Medicines after you have completely understood and accpet all the possible adverse reactions/side effects.        Person Under Monitoring Name: Gregory Christensen  Location: [place] [address]   Infection Prevention Recommendations for Individuals Confirmed to have, or Being Evaluated for, 2019 Novel Coronavirus (COVID-19) Infection Who Receive Care at Home  Individuals who are confirmed to have, or are being evaluated for, COVID-19 should follow the prevention steps below until a healthcare provider or local or state health department says they can return to normal activities.  Stay home except to get medical care You should restrict activities outside your home, except for getting medical  care. Do not go to work, school, or public areas, and do not use public transportation or taxis.  Call ahead before visiting your doctor Before your medical appointment, call the healthcare provider and tell them that you have, or are being evaluated for, COVID-19 infection. This will help the healthcare providers office take steps to keep other people from getting infected. Ask your healthcare provider to call the local or state health department.  Monitor your symptoms Seek prompt medical attention if your illness is worsening (e.g., difficulty breathing). Before going to your medical appointment, call the healthcare provider and tell them that you have, or are being evaluated for, COVID-19 infection. Ask your healthcare provider to call the local or state health department.  Wear a facemask You should wear a facemask that covers your nose and mouth when you are in the same room with other people and when you visit a healthcare provider. People who live with or visit you should also wear a facemask while they are in the same room with you.  Separate yourself from other people in your home As much as possible, you should stay in a different room from other people in your home. Also, you should use a separate bathroom, if available.  Avoid sharing household items You should not share dishes, drinking glasses, cups, eating utensils, towels, bedding, or other items with other people in your home. After using these items, you should wash them thoroughly with soap and water.  Cover your coughs and sneezes Cover your mouth and nose with a tissue when you cough or sneeze, or you can cough or  sneeze into your sleeve. Throw used tissues in a lined trash can, and immediately wash your hands with soap and water for at least 20 seconds or use an alcohol-based hand rub.  Wash your [facility] your hands often and thoroughly with soap and water for at least 20 seconds. You can use an alcohol-based  hand sanitizer if soap and water are not available and if your hands are not visibly dirty. Avoid touching your eyes, nose, and mouth with unwashed hands.   Prevention Steps for Caregivers and Household Members of Individuals Confirmed to have, or Being Evaluated for, COVID-19 Infection Being Cared for in the Home  If you live with, or provide care at home for, a person confirmed to have, or being evaluated for, COVID-19 infection please follow these guidelines to prevent infection:  Follow healthcare providers instructions Make sure that you understand and can help the patient follow any healthcare provider instructions for all care.  Provide for the patients basic needs You should help the patient with basic needs in the home and provide support for getting groceries, prescriptions, and other personal needs.  Monitor the patients symptoms If they are getting sicker, call his or her medical provider and tell them that the patient has, or is being evaluated for, COVID-19 infection. This will help the healthcare providers office take steps to keep other people from getting infected. Ask the healthcare provider to call the local or state health department.  Limit the number of people who have contact with the patient  If possible, have only one caregiver for the patient.  Other household members should stay in another home or place of residence. If this is not possible, they should stay  in another room, or be separated from the patient as much as possible. Use a separate bathroom, if available.  Restrict visitors who do not have an essential need to be in the home.  Keep older adults, very young children, and other sick people away from the patient Keep older adults, very young children, and those who have compromised immune systems or chronic health conditions away from the patient. This includes people with chronic heart, lung, or kidney conditions, diabetes, and  cancer.  Ensure good ventilation Make sure that shared spaces in the home have good air flow, such as from an air conditioner or an opened window, weather permitting.  Wash your hands often  Wash your hands often and thoroughly with soap and water for at least 20 seconds. You can use an alcohol based hand sanitizer if soap and water are not available and if your hands are not visibly dirty.  Avoid touching your eyes, nose, and mouth with unwashed hands.  Use disposable paper towels to dry your hands. If not available, use dedicated cloth towels and replace them when they become wet.  Wear a facemask and gloves  Wear a disposable facemask at all times in the room and gloves when you touch or have contact with the patients blood, body fluids, and/or secretions or excretions, such as sweat, saliva, sputum, nasal mucus, vomit, urine, or feces.  Ensure the mask fits over your nose and mouth tightly, and do not touch it during use.  Throw out disposable facemasks and gloves after using them. Do not reuse.  Wash your hands immediately after removing your facemask and gloves.  If your personal clothing becomes contaminated, carefully remove clothing and launder. Wash your hands after handling contaminated clothing.  Place all used disposable facemasks, gloves, and other waste  in a lined container before disposing them with other household waste.  Remove gloves and wash your hands immediately after handling these items.  Do not share dishes, glasses, or other household items with the patient  Avoid sharing household items. You should not share dishes, drinking glasses, cups, eating utensils, towels, bedding, or other items with a patient who is confirmed to have, or being evaluated for, COVID-19 infection.  After the person uses these items, you should wash them thoroughly with soap and water.  Wash laundry thoroughly  Immediately remove and wash clothes or bedding that have blood, body  fluids, and/or secretions or excretions, such as sweat, saliva, sputum, nasal mucus, vomit, urine, or feces, on them.  Wear gloves when handling laundry from the patient.  Read and follow directions on labels of laundry or clothing items and detergent. In general, wash and dry with the warmest temperatures recommended on the label.  Clean all areas the individual has used often  Clean all touchable surfaces, such as counters, tabletops, doorknobs, bathroom fixtures, toilets, phones, keyboards, tablets, and bedside tables, every day. Also, clean any surfaces that may have blood, body fluids, and/or secretions or excretions on them.  Wear gloves when cleaning surfaces the patient has come in contact with.  Use a diluted bleach solution (e.g., dilute bleach with 1 part bleach and 10 parts water) or a household disinfectant with a label that says EPA-registered for coronaviruses. To make a bleach solution at home, add 1 tablespoon of bleach to 1 quart (4 cups) of water. For a larger supply, add  cup of bleach to 1 gallon (16 cups) of water.  Read labels of cleaning products and follow recommendations provided on product labels. Labels contain instructions for safe and effective use of the cleaning product including precautions you should take when applying the product, such as wearing gloves or eye protection and making sure you have good ventilation during use of the product.  Remove gloves and wash hands immediately after cleaning.  Monitor yourself for signs and symptoms of illness Caregivers and household members are considered close contacts, should monitor their health, and will be asked to limit movement outside of the home to the extent possible. Follow the monitoring steps for close contacts listed on the symptom monitoring form.   ? If you have additional questions, contact your local health department or call the epidemiologist on call at [phone] (available 24/7). ? This  guidance is subject to change. For the most up-to-date guidance from [facility], please refer to their website: YouBlogs.pl     Information on my medicine - ELIQUIS (apixaban)  This medication education was reviewed with me or my healthcare representative as part of my discharge preparation.  The pharmacist that spoke with me during my hospital stay was:  Onnie Boer, RPH-CPP  Why was Eliquis prescribed for you? Eliquis was prescribed for you to reduce the risk of forming blood clots that can cause a stroke if you have a medical condition called atrial fibrillation (a type of irregular heartbeat) OR to reduce the risk of a blood clots forming after orthopedic surgery.  What do You need to know about Eliquis ? Take your Eliquis TWICE DAILY - one tablet in the morning and one tablet in the evening with or without food.  It would be best to take the doses about the same time each day.  If you have difficulty swallowing the tablet whole please discuss with your pharmacist how to take the medication safely.  Take  Eliquis exactly as prescribed by your doctor and DO NOT stop taking Eliquis without talking to the doctor who prescribed the medication.  Stopping may increase your risk of developing a new clot or stroke.  Refill your prescription before you run out.  After discharge, you should have regular check-up appointments with your healthcare provider that is prescribing your Eliquis.  In the future your dose may need to be changed if your kidney function or weight changes by a significant amount or as you get older.  What do you do if you miss a dose? If you miss a dose, take it as soon as you remember on the same day and resume taking twice daily.  Do not take more than one dose of ELIQUIS at the same time.  Important Safety Information A possible side effect of Eliquis is bleeding. You should call your healthcare provider right  away if you experience any of the following: ? Bleeding from an injury or your nose that does not stop. ? Unusual colored urine (red or dark brown) or unusual colored stools (red or black). ? Unusual bruising for unknown reasons. ? A serious fall or if you hit your head (even if there is no bleeding).  Some medicines may interact with Eliquis and might increase your risk of bleeding or clotting while on Eliquis. To help avoid this, consult your healthcare provider or pharmacist prior to using any new prescription or non-prescription medications, including herbals, vitamins, non-steroidal anti-inflammatory drugs (NSAIDs) and supplements.  This website has more information on Eliquis (apixaban): [email or web address].no.

## 2019-03-05 NOTE — Progress Notes (Signed)
Educated patient on the importance of ambulation, he is refusing any ADL cares and just talks about going home and "doing all of that". Pt does c/o headache and anxiety. Klonopin and norco will be given appropriately. Call light within reach

## 2019-03-05 NOTE — Progress Notes (Addendum)
Pt was resting quietly and then suddenly started to c/o anxiety I helped him to [facility] to void, talked to him for awhile, gave him an [place] ice to distract, he is now more comfortable and watching TV. Call light wtihin reach, VSS, O2 98% on 2L/[place] with no signs of SOB

## 2019-03-06 DIAGNOSIS — J441 Chronic obstructive pulmonary disease with (acute) exacerbation: Secondary | ICD-10-CM | POA: Diagnosis not present

## 2019-03-16 DIAGNOSIS — I2510 Atherosclerotic heart disease of native coronary artery without angina pectoris: Secondary | ICD-10-CM | POA: Diagnosis not present

## 2019-03-16 DIAGNOSIS — E7800 Pure hypercholesterolemia, unspecified: Secondary | ICD-10-CM | POA: Diagnosis not present

## 2019-03-16 DIAGNOSIS — N183 Chronic kidney disease, stage 3 (moderate): Secondary | ICD-10-CM | POA: Diagnosis not present

## 2019-03-16 DIAGNOSIS — U071 COVID-19: Secondary | ICD-10-CM | POA: Diagnosis not present

## 2019-03-16 DIAGNOSIS — R0902 Hypoxemia: Secondary | ICD-10-CM | POA: Diagnosis not present

## 2019-03-16 DIAGNOSIS — Z85528 Personal history of other malignant neoplasm of kidney: Secondary | ICD-10-CM | POA: Diagnosis not present

## 2019-03-16 DIAGNOSIS — J449 Chronic obstructive pulmonary disease, unspecified: Secondary | ICD-10-CM | POA: Diagnosis not present

## 2019-03-16 DIAGNOSIS — Z905 Acquired absence of kidney: Secondary | ICD-10-CM | POA: Diagnosis not present

## 2019-03-16 DIAGNOSIS — D649 Anemia, unspecified: Secondary | ICD-10-CM | POA: Diagnosis not present

## 2019-03-26 DIAGNOSIS — E7800 Pure hypercholesterolemia, unspecified: Secondary | ICD-10-CM | POA: Diagnosis not present

## 2019-03-26 DIAGNOSIS — Z905 Acquired absence of kidney: Secondary | ICD-10-CM | POA: Diagnosis not present

## 2019-03-26 DIAGNOSIS — Z85528 Personal history of other malignant neoplasm of kidney: Secondary | ICD-10-CM | POA: Diagnosis not present

## 2019-03-26 DIAGNOSIS — U071 COVID-19: Secondary | ICD-10-CM | POA: Diagnosis not present

## 2019-03-26 DIAGNOSIS — J449 Chronic obstructive pulmonary disease, unspecified: Secondary | ICD-10-CM | POA: Diagnosis not present

## 2019-03-26 DIAGNOSIS — D649 Anemia, unspecified: Secondary | ICD-10-CM | POA: Diagnosis not present

## 2019-03-26 DIAGNOSIS — N183 Chronic kidney disease, stage 3 (moderate): Secondary | ICD-10-CM | POA: Diagnosis not present

## 2019-04-02 DIAGNOSIS — D649 Anemia, unspecified: Secondary | ICD-10-CM | POA: Diagnosis not present

## 2019-04-02 DIAGNOSIS — E7800 Pure hypercholesterolemia, unspecified: Secondary | ICD-10-CM | POA: Diagnosis not present

## 2019-04-02 DIAGNOSIS — Z23 Encounter for immunization: Secondary | ICD-10-CM | POA: Diagnosis not present

## 2019-04-05 DIAGNOSIS — J441 Chronic obstructive pulmonary disease with (acute) exacerbation: Secondary | ICD-10-CM | POA: Diagnosis not present

## 2019-04-06 DIAGNOSIS — C652 Malignant neoplasm of left renal pelvis: Secondary | ICD-10-CM | POA: Diagnosis not present

## 2019-04-13 ENCOUNTER — Other Ambulatory Visit (HOSPITAL_COMMUNITY): Payer: Self-pay | Admitting: Urology

## 2019-04-13 ENCOUNTER — Other Ambulatory Visit: Payer: Self-pay

## 2019-04-13 ENCOUNTER — Ambulatory Visit (HOSPITAL_COMMUNITY)
Admission: RE | Admit: 2019-04-13 | Discharge: 2019-04-13 | Disposition: A | Discharge status: Home / Self Care | Payer: Medicare HMO | Source: Ambulatory Visit | Attending: Urology | Admitting: Urology

## 2019-04-13 DIAGNOSIS — C652 Malignant neoplasm of left renal pelvis: Secondary | ICD-10-CM | POA: Insufficient documentation

## 2019-04-13 DIAGNOSIS — J439 Emphysema, unspecified: Secondary | ICD-10-CM | POA: Diagnosis not present

## 2019-04-14 DIAGNOSIS — E7800 Pure hypercholesterolemia, unspecified: Secondary | ICD-10-CM | POA: Diagnosis not present

## 2019-04-14 DIAGNOSIS — N183 Chronic kidney disease, stage 3 unspecified: Secondary | ICD-10-CM | POA: Diagnosis not present

## 2019-04-14 DIAGNOSIS — Z905 Acquired absence of kidney: Secondary | ICD-10-CM | POA: Diagnosis not present

## 2019-04-14 DIAGNOSIS — D649 Anemia, unspecified: Secondary | ICD-10-CM | POA: Diagnosis not present

## 2019-04-14 DIAGNOSIS — R0902 Hypoxemia: Secondary | ICD-10-CM | POA: Diagnosis not present

## 2019-04-14 DIAGNOSIS — J449 Chronic obstructive pulmonary disease, unspecified: Secondary | ICD-10-CM | POA: Diagnosis not present

## 2019-04-14 DIAGNOSIS — I10 Essential (primary) hypertension: Secondary | ICD-10-CM | POA: Diagnosis not present

## 2019-04-17 DIAGNOSIS — M545 Low back pain: Secondary | ICD-10-CM | POA: Diagnosis not present

## 2019-04-17 DIAGNOSIS — N50812 Left testicular pain: Secondary | ICD-10-CM | POA: Diagnosis not present

## 2019-04-17 DIAGNOSIS — C652 Malignant neoplasm of left renal pelvis: Secondary | ICD-10-CM | POA: Diagnosis not present

## 2019-04-21 ENCOUNTER — Emergency Department (HOSPITAL_COMMUNITY)
Admission: EM | Admit: 2019-04-21 | Discharge: 2019-04-21 | Disposition: A | Discharge status: Home / Self Care | Payer: No Typology Code available for payment source | Attending: Emergency Medicine | Admitting: Emergency Medicine

## 2019-04-21 ENCOUNTER — Encounter (HOSPITAL_COMMUNITY): Payer: Self-pay | Admitting: *Deleted

## 2019-04-21 DIAGNOSIS — I10 Essential (primary) hypertension: Secondary | ICD-10-CM | POA: Diagnosis not present

## 2019-04-21 DIAGNOSIS — N5082 Scrotal pain: Secondary | ICD-10-CM | POA: Diagnosis not present

## 2019-04-21 DIAGNOSIS — N5089 Other specified disorders of the male genital organs: Secondary | ICD-10-CM | POA: Diagnosis present

## 2019-04-21 DIAGNOSIS — N492 Inflammatory disorders of scrotum: Secondary | ICD-10-CM | POA: Diagnosis not present

## 2019-04-21 DIAGNOSIS — Z79899 Other long term (current) drug therapy: Secondary | ICD-10-CM | POA: Diagnosis not present

## 2019-04-21 DIAGNOSIS — J449 Chronic obstructive pulmonary disease, unspecified: Secondary | ICD-10-CM | POA: Diagnosis not present

## 2019-04-21 DIAGNOSIS — Z7901 Long term (current) use of anticoagulants: Secondary | ICD-10-CM | POA: Insufficient documentation

## 2019-04-21 DIAGNOSIS — I2510 Atherosclerotic heart disease of native coronary artery without angina pectoris: Secondary | ICD-10-CM | POA: Insufficient documentation

## 2019-04-21 DIAGNOSIS — Z87891 Personal history of nicotine dependence: Secondary | ICD-10-CM | POA: Diagnosis not present

## 2019-04-21 MED ORDER — MORPHINE SULFATE (PF) 4 MG/ML IV SOLN
4.0000 mg | Freq: Once | INTRAVENOUS | Status: AC
  Administered 2019-04-21: 4 mg via INTRAMUSCULAR
  Filled 2019-04-21: qty 1

## 2019-04-21 NOTE — Discharge Instructions (Signed)
Please follow-up with your PCP and your urologist for ongoing evaluation and management of your persistent hydrocele and intermittent pain.  Return to the clinic to develop any new or worsening testicular discomfort, uncontrolled nausea vomiting, fevers or chills, or any other new or worsening symptoms.

## 2019-04-21 NOTE — ED Triage Notes (Signed)
Pain in lower abd, swelling in both testicles.

## 2019-04-21 NOTE — ED Provider Notes (Signed)
[facility] DEPT Provider Note   CSN: [id number] Arrival date & time: 04/21/19  1219     History   Chief Complaint Chief Complaint  Patient presents with  . Abdominal Pain  . Groin Swelling    HPI Gregory Christensen is a 68 y.o. 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  Patient has been evaluated in the ER and by urology numerous times in the past 5 months since his nephrectomy for this exact complaint, as recently as 8 days ago.  He has had numerous ultrasounds and CT abdomen pelvis imagings which have not explained his persistent hydrocele and discomfort.  He denies any fevers, chills, chest pain, shortness of breath, penile discharge, recent sexual intercourse, new or different abdominal discomfort, new or different testicular discomfort, or any other symptoms.      HPI  Past Medical History:  Diagnosis Date  . Acute deep vein thrombosis (DVT) of left lower extremity ([place])   . Acute hypoxemic respiratory failure ([place])   . Anxiety   . Aortic atherosclerosis ([place])   . Arthritis   . Benign prostatic hyperplasia 08/28/2018   UNSPECIFIED WHETHER LOWER URINARY TRACT SYMPTOMS PRESENT  . CAD (coronary artery disease)   . Chronic back pain   . Chronic hip pain   . COPD exacerbation ([place]) 10/18/2017  . Emphysema lung ([place])   . Essential hypertension   . GERD (gastroesophageal reflux disease)   . Gout   . Gross hematuria 09/08/2018  . History of tobacco abuse   . Hypoxia   . Pulmonary embolism ([place]) 05/31/2018  . Renal mass    CONCERNING FOR RENAL CELL CARCINOMA DR. Lovena Neighbours  . Respiratory distress   . Respiratory failure, acute-on-chronic ([place]) 08/03/2018  . Seasonal allergies   . Sleep apnea     Patient Active Problem List   Diagnosis Date Noted  . Pneumonia due to  severe acute respiratory syndrome coronavirus 2 (SARS-CoV-2) 02/28/2019  . Renal mass 10/31/2018  . Chest pressure 10/15/2018  . Coronary artery calcification 10/15/2018  . Respiratory failure, acute-on-chronic ([place]) 08/03/2018  . Acute deep vein thrombosis (DVT) of left lower extremity ([place])   . Acute hypoxemic respiratory failure ([place])   . Pulmonary embolism ([place]) 05/31/2018  . COPD exacerbation ([place]) 10/18/2017  . Hypoxia   . Respiratory distress   . Essential hypertension     Past Surgical History:  Procedure Laterality Date  . CYSTOSCOPY/URETEROSCOPY/HOLMIUM LASER/STENT PLACEMENT Left 10/31/2018   Procedure: CYSTOSCOPY/URETEROSCOPY;  Surgeon: Ceasar Mons, MD;  Location: [facility] ORS;  Service: Urology;  Laterality: Left;  . HERNIA REPAIR  1977  . MANDIBLE SURGERY  1976   EXTENSIVE  . ROBOT ASSITED LAPAROSCOPIC NEPHROURETERECTOMY Left 10/31/2018   Procedure: XI ROBOT ASSITED LAPAROSCOPIC NEPHROURETERECTOMY;  Surgeon: Ceasar Mons, MD;  Location: [facility] OR[facility];  Service: Urology;  Laterality: Left;  ONLY NEEDS 240 MIN FOR ALL PROCEDURES        Home Medications    Prior to Admission medications   Medication Sig Start Date End Date Taking? Authorizing Provider  acetaminophen (TYLENOL) 500 MG tablet Take 1,000 mg by mouth every 6 (six) hours as needed for moderate pain or headache.     [provider]  albuterol (PROVENTIL HFA;VENTOLIN HFA) 108 (90 Base) MCG/ACT inhaler Inhale 2 puffs into the lungs every 2 (two)  hours as needed for wheezing or shortness of breath. 08/04/18   Reyne Dumas, MD  albuterol (PROVENTIL) (2.5 MG/3ML) 0.083% nebulizer solution Take 3 mLs (2.5 mg total) by nebulization every 6 (six) hours as needed for wheezing or shortness of breath. 08/04/18   Reyne Dumas, MD  amLODipine (NORVASC) 2.5 MG tablet Take 2.5 mg by mouth daily.    [provider]  apixaban (ELIQUIS) 5 MG TABS tablet Take 1 tablet (5 mg total) by mouth 2 (two) times  daily. 06/10/18   Regalado, Belkys A, MD  azithromycin (ZITHROMAX) 250 MG tablet Take 1 tablet by mouth daily. 04/16/19   [provider]  budesonide-formoterol (SYMBICORT) 160-4.5 MCG/ACT inhaler Inhale 1 puff into the lungs 2 (two) times daily. 08/04/18   Reyne Dumas, MD  docusate sodium (COLACE) 100 MG capsule Take 1 capsule (100 mg total) by mouth 2 (two) times daily. Patient not taking: Reported on 03/01/2019 10/31/18   Debbrah Alar, PA-C  fluticasone [facility]) 50 MCG/ACT nasal spray Place 1 spray into both nostrils 2 (two) times daily. Patient taking differently: Place 1 spray into both nostrils 2 (two) times daily as needed for allergies.  08/04/18   Reyne Dumas, MD  gabapentin (NEURONTIN) 300 MG capsule Take 300 mg by mouth 2 (two) times daily.     [provider]  HYDROcodone-acetaminophen (NORCO/VICODIN) 5-325 MG tablet Take 1 tablet by mouth every 4 (four) hours as needed. 02/19/19   Isla Pence, MD  ketotifen (ZADITOR) 0.025 % ophthalmic solution Place 1 drop into both eyes 2 (two) times daily as needed (allergies).    [provider]  loratadine (CLARITIN) 10 MG tablet Take 10 mg by mouth daily.    [provider]  montelukast (SINGULAIR) 10 MG tablet Take 10 mg by mouth at bedtime.    [provider]  predniSONE (DELTASONE) 5 MG tablet Label  & dispense according to the schedule below. take 8 Pills PO for 3 days, 6 Pills PO for 3 days, 4 Pills PO for 3 days, 2 Pills PO for 3 days, 1 Pills PO for 3 days, 1/2 Pill  PO for 3 days then STOP. Total 65 pills. 03/05/19   Thurnell Lose, MD  Tiotropium Bromide Monohydrate (SPIRIVA RESPIMAT) 1.25 MCG/ACT AERS Inhale 2 puffs into the lungs daily.    [provider]    Family History Family History  Problem Relation Age of Onset  . Diabetes Mother   . Hypertension Sister   . Hypertension Brother     Social History Social History   Tobacco Use  . Smoking status: Former Smoker     Packs/day: 1.00    Years: 26.00    Pack years: 26.00    Types: Cigarettes    Quit date: 10/18/2017    Years since quitting: 1.5  . Smokeless tobacco: Never Used  Substance Use Topics  . Alcohol use: Yes    Comment: 2 beers a day   . Drug use: Yes    Types: Flunitrazepam     Allergies   Patient has no known allergies.   Review of Systems Review of Systems  All other systems reviewed and are negative.    Physical Exam Updated Vital Signs BP (!) 143/68   Pulse 80   Temp 98 F (36.7 C) (Oral)   Resp 18   Ht 6\' 1"  (1.854 m)   Wt 69.4 kg   SpO2 100%   BMI 20.19 kg/m   Physical Exam Vitals signs and nursing note  reviewed. Exam conducted with a chaperone present.  Constitutional:      Appearance: Normal appearance.  HENT:     Head: Normocephalic and atraumatic.  Eyes:     General: No scleral icterus.    Conjunctiva/sclera: Conjunctivae normal.  Cardiovascular:     Rate and Rhythm: Normal rate.     Pulses: Normal pulses.  Pulmonary:     Effort: Pulmonary effort is normal. No respiratory distress.  Abdominal:     General: Abdomen is flat. There is no distension.     Palpations: Abdomen is soft.     Tenderness: There is no guarding.  Genitourinary:    Comments: Scrotum: Mild to moderate left-sided hydrocele.  Testicles feel equal in size.  Tender to palpation bilaterally.  No high riding testicle.  Cremasteric reflex intact. Penis: No evidence of ulcerations or penile discharge.  Nontender. Skin:    General: Skin is dry.  Neurological:     Mental Status: He is alert.     GCS: GCS eye subscore is 4. GCS verbal subscore is 5. GCS motor subscore is 6.  Psychiatric:        Mood and Affect: Mood normal.        Behavior: Behavior normal.        Thought Content: Thought content normal.      ED Treatments / Results  Labs (all labs ordered are listed, but only abnormal results are displayed) Labs Reviewed - No data to display  EKG None  Radiology No results  found.  Procedures Procedures (including critical care time)  Medications Ordered in ED Medications  morphine 4 MG/ML injection 4 mg (4 mg Intramuscular Given 04/21/19 1523)     Initial Impression / Assessment and Plan / ED Course  I have reviewed the triage vital signs and the nursing notes.  Pertinent labs & imaging results that were available during my care of the patient were reviewed by me and considered in my medical decision making (see chart for details).        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  Patient reports that these episodes of discomfort have occurred intermittently since his nephrectomy in May 2020 for his left sided renal cell carcinoma.  He reports that the hydrocele is constant and does not resorb.  He has been evaluated in the ED numerous times and saw his urologist as recently as 8 days ago.  He has had repeat ultrasounds and CT abdomen pelvis imagings with no explanation of his continued pain and discomfort.  In addition to his abdominal CT imaging 8 days ago, he had a chest x-ray performed which demonstrated findings consistent with a pneumonia for which she was treated with antibiotics.  Today, he states that his respiratory symptoms are significantly improved and that he no longer feels short of breath beyond his COPD-driven baseline.  I cannot review the CT abdomen from 8 days ago, but patient is adamant that he had one conducted that was normal.  I reviewed a CT abdomen pelvis without contrast from February 19, 2019 which demonstrated no evidence for acute intra-abdominal or pelvic abnormalities.  He reports that his abdominal discomfort today is unchanged from what he has experienced these past 5 months.  Considered Doppler ultrasound to rule out torsion, but patient states that his discomfort is the same discomfort that he is experienced for the past 5 months and that it is nothing new or  different.  No evidence  of high riding testicle.  No nausea or vomiting.  Cremasteric reflex intact.  Patient's discomfort resolved entirely with morphine here in the ED.   He understands that I will not be sending home with analgesics, but instead will encourage him to follow back up with urology for continued evaluation and management.  Also suggested that he perhaps discuss pain management with his PCP given his cyclical, intermittent, acute on chronic pain symptoms.  Return to the clinic to develop any new or worsening testicular discomfort, uncontrolled nausea vomiting, fevers or chills, or any other new or worsening symptoms.  Patient was grateful, voiced understanding, and is agreeable to plan.    Final Clinical Impressions(s) / ED Diagnoses   Final diagnoses:  Scrotal swelling    ED Discharge Orders    None       Corena Herter, PA-C 04/21/19 1553    Charlesetta Shanks, MD 04/22/19 [phone]

## 2019-04-21 NOTE — ED Notes (Addendum)
ED Provider at bedside. 

## 2019-04-22 DIAGNOSIS — R8279 Other abnormal findings on microbiological examination of urine: Secondary | ICD-10-CM | POA: Diagnosis not present

## 2019-04-22 DIAGNOSIS — N453 Epididymo-orchitis: Secondary | ICD-10-CM | POA: Diagnosis not present

## 2019-04-22 DIAGNOSIS — N50812 Left testicular pain: Secondary | ICD-10-CM | POA: Diagnosis not present

## 2019-04-25 ENCOUNTER — Other Ambulatory Visit: Payer: Self-pay

## 2019-04-25 ENCOUNTER — Encounter (HOSPITAL_COMMUNITY): Payer: Self-pay

## 2019-04-25 ENCOUNTER — Emergency Department (HOSPITAL_COMMUNITY)
Admission: EM | Admit: 2019-04-25 | Discharge: 2019-04-25 | Disposition: A | Discharge status: Home / Self Care | Payer: Medicare HMO | Attending: Emergency Medicine | Admitting: Emergency Medicine

## 2019-04-25 DIAGNOSIS — R222 Localized swelling, mass and lump, trunk: Secondary | ICD-10-CM | POA: Insufficient documentation

## 2019-04-25 DIAGNOSIS — Z5321 Procedure and treatment not carried out due to patient leaving prior to being seen by health care provider: Secondary | ICD-10-CM | POA: Insufficient documentation

## 2019-04-25 NOTE — ED Triage Notes (Signed)
Pt states that he was here on Tuesday with the same complaint- pt states that he is having testicular swelling. Pt states that he followed up with urology after being seen here, and was given levo rx. Pt states that he has been taking medicine as prescribed, but swelling has not gone down at all.

## 2019-04-25 NOTE — ED Notes (Addendum)
Pt became frustrated with wait. Pt states he is going to urgent care.

## 2019-04-27 ENCOUNTER — Other Ambulatory Visit: Payer: Self-pay

## 2019-04-27 ENCOUNTER — Encounter (HOSPITAL_COMMUNITY): Payer: Self-pay | Admitting: Emergency Medicine

## 2019-04-27 ENCOUNTER — Emergency Department (HOSPITAL_COMMUNITY): Payer: No Typology Code available for payment source

## 2019-04-27 ENCOUNTER — Emergency Department (HOSPITAL_COMMUNITY)
Admission: EM | Admit: 2019-04-27 | Discharge: 2019-04-27 | Disposition: A | Discharge status: Home / Self Care | Payer: No Typology Code available for payment source | Attending: Emergency Medicine | Admitting: Emergency Medicine

## 2019-04-27 DIAGNOSIS — I259 Chronic ischemic heart disease, unspecified: Secondary | ICD-10-CM | POA: Insufficient documentation

## 2019-04-27 DIAGNOSIS — N433 Hydrocele, unspecified: Secondary | ICD-10-CM | POA: Insufficient documentation

## 2019-04-27 DIAGNOSIS — I82492 Acute embolism and thrombosis of other specified deep vein of left lower extremity: Secondary | ICD-10-CM | POA: Insufficient documentation

## 2019-04-27 DIAGNOSIS — Z87891 Personal history of nicotine dependence: Secondary | ICD-10-CM | POA: Diagnosis not present

## 2019-04-27 DIAGNOSIS — I10 Essential (primary) hypertension: Secondary | ICD-10-CM | POA: Insufficient documentation

## 2019-04-27 DIAGNOSIS — R109 Unspecified abdominal pain: Secondary | ICD-10-CM | POA: Diagnosis not present

## 2019-04-27 DIAGNOSIS — I2699 Other pulmonary embolism without acute cor pulmonale: Secondary | ICD-10-CM | POA: Diagnosis not present

## 2019-04-27 DIAGNOSIS — R1032 Left lower quadrant pain: Secondary | ICD-10-CM | POA: Diagnosis not present

## 2019-04-27 DIAGNOSIS — Z960 Presence of urogenital implants: Secondary | ICD-10-CM | POA: Diagnosis not present

## 2019-04-27 DIAGNOSIS — Z7901 Long term (current) use of anticoagulants: Secondary | ICD-10-CM | POA: Insufficient documentation

## 2019-04-27 DIAGNOSIS — N50812 Left testicular pain: Secondary | ICD-10-CM | POA: Diagnosis present

## 2019-04-27 DIAGNOSIS — J449 Chronic obstructive pulmonary disease, unspecified: Secondary | ICD-10-CM | POA: Diagnosis not present

## 2019-04-27 DIAGNOSIS — Z79899 Other long term (current) drug therapy: Secondary | ICD-10-CM | POA: Diagnosis not present

## 2019-04-27 LAB — CBC WITH DIFFERENTIAL/PLATELET
Abs Immature Granulocytes: 0.03 10*3/uL (ref 0.00–0.07)
Basophils Absolute: 0 10*3/uL (ref 0.0–0.1)
Basophils Relative: 0 %
Eosinophils Absolute: 0.1 10*3/uL (ref 0.0–0.5)
Eosinophils Relative: 1 %
HCT: 35.8 % — ABNORMAL LOW (ref 39.0–52.0)
Hemoglobin: 10.8 g/dL — ABNORMAL LOW (ref 13.0–17.0)
Immature Granulocytes: 0 %
Lymphocytes Relative: 19 %
Lymphs Abs: 1.5 10*3/uL (ref 0.7–4.0)
MCH: 23.8 pg — ABNORMAL LOW (ref 26.0–34.0)
MCHC: 30.2 g/dL (ref 30.0–36.0)
MCV: 79 fL — ABNORMAL LOW (ref 80.0–100.0)
Monocytes Absolute: 0.7 10*3/uL (ref 0.1–1.0)
Monocytes Relative: 9 %
Neutro Abs: 5.4 10*3/uL (ref 1.7–7.7)
Neutrophils Relative %: 71 %
Platelets: 240 10*3/uL (ref 150–400)
RBC: 4.53 MIL/uL (ref 4.22–5.81)
RDW: 18.3 % — ABNORMAL HIGH (ref 11.5–15.5)
WBC: 7.7 10*3/uL (ref 4.0–10.5)
nRBC: 0 % (ref 0.0–0.2)

## 2019-04-27 LAB — COMPREHENSIVE METABOLIC PANEL
ALT: 13 U/L (ref 0–44)
AST: 23 U/L (ref 15–41)
Albumin: 4 g/dL (ref 3.5–5.0)
Alkaline Phosphatase: 102 U/L (ref 38–126)
Anion gap: 10 (ref 5–15)
BUN: 15 mg/dL (ref 8–23)
CO2: 22 mmol/L (ref 22–32)
Calcium: 9.5 mg/dL (ref 8.9–10.3)
Chloride: 100 mmol/L (ref 98–111)
Creatinine, Ser: 1.69 mg/dL — ABNORMAL HIGH (ref 0.61–1.24)
GFR calc Af Amer: 48 mL/min — ABNORMAL LOW (ref >60)
GFR calc non Af Amer: 41 mL/min — ABNORMAL LOW (ref >60)
Glucose, Bld: 99 mg/dL (ref 70–99)
Potassium: 4.6 mmol/L (ref 3.5–5.1)
Sodium: 132 mmol/L — ABNORMAL LOW (ref 135–145)
Total Bilirubin: 0.3 mg/dL (ref 0.3–1.2)
Total Protein: 9.3 g/dL — ABNORMAL HIGH (ref 6.5–8.1)

## 2019-04-27 LAB — URINALYSIS, ROUTINE W REFLEX MICROSCOPIC
Bacteria, UA: NONE SEEN
Bilirubin Urine: NEGATIVE
Glucose, UA: NEGATIVE mg/dL
Hgb urine dipstick: NEGATIVE
Ketones, ur: NEGATIVE mg/dL
Nitrite: NEGATIVE
Protein, ur: NEGATIVE mg/dL
Specific Gravity, Urine: 1.012 (ref 1.005–1.030)
pH: 6 (ref 5.0–8.0)

## 2019-04-27 MED ORDER — MORPHINE SULFATE (PF) 4 MG/ML IV SOLN
4.0000 mg | Freq: Once | INTRAVENOUS | Status: AC
  Administered 2019-04-27: 4 mg via INTRAVENOUS
  Filled 2019-04-27: qty 1

## 2019-04-27 NOTE — ED Provider Notes (Signed)
[facility] DEPT Provider Note   CSN: [id number] Arrival date & time: 04/27/19  7062     History   Chief Complaint Chief Complaint  Patient presents with   Testicle Pain    HPI Gregory Christensen is a 68 y.o. male.     68 y.o male with a PMH of PE, Chronic hip pain, renal mass, COPD presents to the ED with a chief complaint of left testicular pain x months. Patient was seen in the ED on 10/22 diagnosed with a left hydrocele, he was instructed to follow up with urology. He was seen by Dr. Noberto Retort, urology 1 week ago, was given a prescription for tramadol and levofloxacin for his symptoms, he reports worsening pain along with worsening swelling to his left testicle. He also endorses some left lower abdominal pain, states "hurts at the site where my kidney was removed". He also reports pain on his left back with radiation onto his abdomen. Patient is also endorsing some urinary discomfort, such as dysuria along with urgency and unable to fully void. He denies any fever, hematuria, vomiting or penile discharge.   The history is provided by the patient.    Past Medical History:  Diagnosis Date   Acute deep vein thrombosis (DVT) of left lower extremity (HCC)    Acute hypoxemic respiratory failure (HCC)    Anxiety    Aortic atherosclerosis (HCC)    Arthritis    Benign prostatic hyperplasia 08/28/2018   UNSPECIFIED WHETHER LOWER URINARY TRACT SYMPTOMS PRESENT   CAD (coronary artery disease)    Chronic back pain    Chronic hip pain    COPD exacerbation (HCC) 10/18/2017   Emphysema lung (HCC)    Essential hypertension    GERD (gastroesophageal reflux disease)    Gout    Gross hematuria 09/08/2018   History of tobacco abuse    Hypoxia    Pulmonary embolism ([place]) 05/31/2018   Renal mass    CONCERNING FOR RENAL CELL CARCINOMA DR. Lovena Neighbours   Respiratory distress    Respiratory failure, acute-on-chronic ([place]) 08/03/2018   Seasonal  allergies    Sleep apnea     Patient Active Problem List   Diagnosis Date Noted   Pneumonia due to severe acute respiratory syndrome coronavirus 2 (SARS-CoV-2) 02/28/2019   Renal mass 10/31/2018   Chest pressure 10/15/2018   Coronary artery calcification 10/15/2018   Respiratory failure, acute-on-chronic ([place]) 08/03/2018   Acute deep vein thrombosis (DVT) of left lower extremity (HCC)    Acute hypoxemic respiratory failure ([place])    Pulmonary embolism ([place]) 05/31/2018   COPD exacerbation ([place]) 10/18/2017   Hypoxia    Respiratory distress    Essential hypertension     Past Surgical History:  Procedure Laterality Date   CYSTOSCOPY/URETEROSCOPY/HOLMIUM LASER/STENT PLACEMENT Left 10/31/2018   Procedure: CYSTOSCOPY/URETEROSCOPY;  Surgeon: Ceasar Mons, MD;  Location: [facility];  Service: Urology;  Laterality: Left;   [place]   EXTENSIVE   ROBOT ASSITED LAPAROSCOPIC NEPHROURETERECTOMY Left 10/31/2018   Procedure: XI ROBOT ASSITED LAPAROSCOPIC NEPHROURETERECTOMY;  Surgeon: Ceasar Mons, MD;  Location: [facility];  Service: Urology;  Laterality: Left;  ONLY NEEDS 240 MIN FOR ALL PROCEDURES        Home Medications    Prior to Admission medications   Medication Sig Start Date End Date Taking? Authorizing Provider  acetaminophen (TYLENOL) 500 MG tablet Take 1,000 mg by mouth every 6 (six) hours as needed for moderate pain  or headache.     [provider]  albuterol (PROVENTIL HFA;VENTOLIN HFA) 108 (90 Base) MCG/ACT inhaler Inhale 2 puffs into the lungs every 2 (two) hours as needed for wheezing or shortness of breath. 08/04/18   Reyne Dumas, MD  albuterol (PROVENTIL) (2.5 MG/3ML) 0.083% nebulizer solution Take 3 mLs (2.5 mg total) by nebulization every 6 (six) hours as needed for wheezing or shortness of breath. 08/04/18   Reyne Dumas, MD  amLODipine (NORVASC) 2.5 MG tablet Take 2.5 mg by mouth daily.     [provider]  apixaban (ELIQUIS) 5 MG TABS tablet Take 1 tablet (5 mg total) by mouth 2 (two) times daily. 06/10/18   Regalado, Belkys A, MD  azithromycin (ZITHROMAX) 250 MG tablet Take 1 tablet by mouth daily. 04/16/19   [provider]  budesonide-formoterol (SYMBICORT) 160-4.5 MCG/ACT inhaler Inhale 1 puff into the lungs 2 (two) times daily. 08/04/18   Reyne Dumas, MD  docusate sodium (COLACE) 100 MG capsule Take 1 capsule (100 mg total) by mouth 2 (two) times daily. Patient not taking: Reported on 03/01/2019 10/31/18   Debbrah Alar, PA-C  fluticasone [facility]) 50 MCG/ACT nasal spray Place 1 spray into both nostrils 2 (two) times daily. Patient taking differently: Place 1 spray into both nostrils 2 (two) times daily as needed for allergies.  08/04/18   Reyne Dumas, MD  gabapentin (NEURONTIN) 300 MG capsule Take 300 mg by mouth 2 (two) times daily.     [provider]  HYDROcodone-acetaminophen (NORCO/VICODIN) 5-325 MG tablet Take 1 tablet by mouth every 4 (four) hours as needed. 02/19/19   Isla Pence, MD  ketotifen (ZADITOR) 0.025 % ophthalmic solution Place 1 drop into both eyes 2 (two) times daily as needed (allergies).    [provider]  loratadine (CLARITIN) 10 MG tablet Take 10 mg by mouth daily.    [provider]  montelukast (SINGULAIR) 10 MG tablet Take 10 mg by mouth at bedtime.    [provider]  predniSONE (DELTASONE) 5 MG tablet Label  & dispense according to the schedule below. take 8 Pills PO for 3 days, 6 Pills PO for 3 days, 4 Pills PO for 3 days, 2 Pills PO for 3 days, 1 Pills PO for 3 days, 1/2 Pill  PO for 3 days then STOP. Total 65 pills. 03/05/19   Thurnell Lose, MD  Tiotropium Bromide Monohydrate (SPIRIVA RESPIMAT) 1.25 MCG/ACT AERS Inhale 2 puffs into the lungs daily.    [provider]    Family History Family History  Problem Relation Age of Onset   Diabetes Mother    Hypertension Sister     Hypertension Brother     Social History Social History   Tobacco Use   Smoking status: Former Smoker    Packs/day: 1.00    Years: 26.00    Pack years: 26.00    Types: Cigarettes    Quit date: 10/18/2017    Years since quitting: 1.5   Smokeless tobacco: Never Used  Substance Use Topics   Alcohol use: Yes    Comment: 2 beers a day    Drug use: Yes    Types: Flunitrazepam     Allergies   Patient has no known allergies.   Review of Systems Review of Systems  Constitutional: Negative for chills and fever.  HENT: Negative for ear pain and sore throat.   Eyes: Negative for pain and visual disturbance.  Respiratory: Negative for cough and shortness of breath.   Cardiovascular:  Negative for chest pain and palpitations.  Gastrointestinal: Positive for abdominal pain. Negative for vomiting.  Genitourinary: Positive for difficulty urinating, flank pain, scrotal swelling and testicular pain. Negative for discharge, dysuria, hematuria, penile pain and penile swelling.  Musculoskeletal: Negative for arthralgias and back pain.  Skin: Negative for color change and rash.  Neurological: Negative for seizures and syncope.  All other systems reviewed and are negative.    Physical Exam Updated Vital Signs BP 136/79 (BP Location: Left Arm)    Pulse 83    Temp 98.1 F (36.7 C) (Oral)    Resp 17    Ht 6\' 1"  (1.854 m)    Wt 69.4 kg    SpO2 100%    BMI 20.19 kg/m   Physical Exam Vitals signs and nursing note reviewed.  Constitutional:      Appearance: He is well-developed. He is not ill-appearing.     Comments: Chronically ill appearing.   HENT:     Head: Normocephalic and atraumatic.  Eyes:     General: No scleral icterus.    Pupils: Pupils are equal, round, and reactive to light.  Neck:     Musculoskeletal: Normal range of motion.  Cardiovascular:     Heart sounds: Normal heart sounds.  Pulmonary:     Effort: Pulmonary effort is normal.     Breath sounds: Normal breath  sounds. No wheezing.  Chest:     Chest wall: No tenderness.  Abdominal:     General: Abdomen is flat. Bowel sounds are decreased. There is no distension.     Palpations: Abdomen is soft.     Tenderness: There is generalized abdominal tenderness and tenderness in the left lower quadrant. There is left CVA tenderness and guarding.     Hernia: No hernia is present.     Comments: Significant tenderness to palpation throughout all abdomen. Bowel sounds are decreased.   Musculoskeletal:        General: No tenderness or deformity.  Skin:    General: Skin is warm and dry.  Neurological:     Mental Status: He is alert and oriented to person, place, and time.      ED Treatments / Results  Labs (all labs ordered are listed, but only abnormal results are displayed) Labs Reviewed  CBC WITH DIFFERENTIAL/PLATELET - Abnormal; Notable for the following components:      Result Value   Hemoglobin 10.8 (*)    HCT 35.8 (*)    MCV 79.0 (*)    MCH 23.8 (*)    RDW 18.3 (*)    All other components within normal limits  COMPREHENSIVE METABOLIC PANEL - Abnormal; Notable for the following components:   Sodium 132 (*)    Creatinine, Ser 1.69 (*)    Total Protein 9.3 (*)    GFR calc non Af Amer 41 (*)    GFR calc Af Amer 48 (*)    All other components within normal limits  URINALYSIS, ROUTINE W REFLEX MICROSCOPIC - Abnormal; Notable for the following components:   Leukocytes,Ua SMALL (*)    All other components within normal limits    EKG None  Radiology Ct Abdomen Pelvis Wo Contrast  Result Date: 04/27/2019 CLINICAL DATA:  Flank pain. EXAM: CT ABDOMEN AND PELVIS WITHOUT CONTRAST TECHNIQUE: Multidetector CT imaging of the abdomen and pelvis was performed following the standard protocol without IV contrast. COMPARISON:  April 13, 2019.  February 21, 2019. FINDINGS: Lower chest: No acute abnormality. Hepatobiliary: No focal liver abnormality is seen.  No gallstones, gallbladder wall thickening, or  biliary dilatation. Pancreas: Unremarkable. No pancreatic ductal dilatation or surrounding inflammatory changes. Spleen: Normal in size without focal abnormality. Adrenals/Urinary Tract: Status post left nephrectomy. Left adrenal gland is not well visualized. Right adrenal gland appears normal. Right kidney and ureter are unremarkable. No hydronephrosis or renal obstruction is noted. No renal or ureteral calculi are noted. Urinary bladder is unremarkable. Stomach/Bowel: Stomach is within normal limits. Appendix appears normal. No evidence of bowel wall thickening, distention, or inflammatory changes. Vascular/Lymphatic: Aortic atherosclerosis. No enlarged abdominal or pelvic lymph nodes. Reproductive: Stable mild prostatic enlargement is noted. Stable left hydrocele is noted. Other: No abdominal wall hernia or abnormality. No abdominopelvic ascites. Musculoskeletal: No acute or significant osseous findings. IMPRESSION: Status post left nephrectomy. No hydronephrosis or renal obstruction is noted. Stable left hydrocele is noted as noted on prior studies. Stable mild prostatic enlargement. No acute abnormality seen in the abdomen or pelvis. Aortic Atherosclerosis (ICD10-I70.0). Electronically Signed   By: Marijo Conception M.D.   On: 04/27/2019 09:42   US Scrotum W/doppler  Result Date: 04/27/2019 CLINICAL DATA:  Scrotal swelling. EXAM: SCROTAL ULTRASOUND DOPPLER ULTRASOUND OF THE TESTICLES TECHNIQUE: Complete ultrasound examination of the testicles, epididymis, and other scrotal structures was performed. Color and spectral Doppler ultrasound were also utilized to evaluate blood flow to the testicles. COMPARISON:  CT scan of same day.  Ultrasound of February 21, 2019. FINDINGS: Right testicle Measurements: 3.8 x 2.1 x 2.1 cm. No mass or microlithiasis visualized. Left testicle Measurements: 2.7 x 2.2 x 2.0 cm. 5 mm cyst is noted which is not significantly changed compared to prior exam. No mass or microlithiasis  visualized. Right epididymis:  Normal in size and appearance. Left epididymis: There is again noted increased vascular flow on Doppler involving the epididymal tail with continued heterogeneous enlargement of the epididymal tail, but overall this appears to be decreased compared to prior exam suggesting improving inflammation. There is noted 1.5 cm lobulated complex abnormality within this area which is not visualized on prior exam, and may represent complex fluid collection such as abscess or less likely neoplasm. Hydrocele:  Septated hydrocele remains. Varicocele:  None visualized. Pulsed Doppler interrogation of both testes demonstrates normal low resistance arterial and venous waveforms bilaterally. IMPRESSION: There is no evidence of testicular torsion. There is continued prominence and heterogeneous enlargement of the left epididymal tail with hypervascular flow based on Doppler, although it appears to be decreased compared to prior exam, suggesting improving inflammation. However, there is now noted 1.5 cm lobulated complex abnormality in this area which was not visualized on prior exam, and may represent complex fluid collection such as abscess, or less likely neoplasm. Stable presence of septated left hydrocele. Electronically Signed   By: Marijo Conception M.D.   On: 04/27/2019 11:40    Procedures Procedures (including critical care time)  Medications Ordered in ED Medications  morphine 4 MG/ML injection 4 mg (4 mg Intravenous Given 04/27/19 0802)  morphine 4 MG/ML injection 4 mg (4 mg Intravenous Given 04/27/19 1255)     Initial Impression / Assessment and Plan / ED Course  I have reviewed the triage vital signs and the nursing notes.  Pertinent labs & imaging results that were available during my care of the patient were reviewed by me and considered in my medical decision making (see chart for details).  Clinical Course as of Apr 26 1344  Mon Apr 27, 2019  1253 [place] GuestMarland Kitchen): SMALL  [JS]  3536 WBC, UA: 11-20 [  JS]    Clinical Course User Index [JS] Janeece Fitting, PA-C       Patient with a specified medical history presents to the ED with complaints of left testicular swelling which has been going on for the past 2 months.  He was seen by urology x2 last week, had normal exams.  Was placed on a levofloxacin course of 10 days, he was also given some tramadol to help with his pain.  Reports worsening symptoms, worsening swelling along with further pain without any relief with his tramadol. He arrived in the ED complaining of pain, vital signs are within normal limits, he is afebrile.  No tachycardia.  He also complains of left flank pain, he does have a previous history of a left nephrectomy.  CBC without any leukocytosis.  CMP with some mild hyponatremia, creatinine level slightly elevated today at 1.69, this is actually consistent with patient's baseline.  UA showed small leukocytosis, 11-20 white blood cells, he is currently on levofloxacin to likely treat a UTI.  A CT renal was ordered without contrast in order to further evaluate patient's kidney as he only has 1 kidney present.  CT Renal showed: Status post left nephrectomy.    No hydronephrosis or renal obstruction is noted.    Stable left hydrocele is noted as noted on prior studies.    Stable mild prostatic enlargement.    No acute abnormality seen in the abdomen or pelvis.    Aortic Atherosclerosis (ICD10-I70.0).   An [place] of this left testicle was obtained to further  Ultrasound showed: There is no evidence of testicular torsion.    There is continued prominence and heterogeneous enlargement of the  left epididymal tail with hypervascular flow based on Doppler,  although it appears to be decreased compared to prior exam,  suggesting improving inflammation. However, there is now noted 1.5  cm lobulated complex abnormality in this area which was not  visualized on prior exam, and may represent complex  fluid collection  such as abscess, or less likely neoplasm. Stable presence of  septated left hydrocele.     11:51 AM Called placed for urology Dr. Lovena Neighbours for further recommendations.   12:59 PM Spoke to Dr. Lovena Neighbours, who saw patient on 04/17/19 with a normal exam.  Dr. Gilford Rile also reports patient was seen twice in office last week, he had an unremarkable work-up.  He may benefit from surgical intervention but this would be an outpatient basis.  He should continue his course of Levaquin which she is currently taking.  I have discussed these results and plan with patient who is agreeable with discharge.  He will continue taking his Levaquin along with tramadol to help with his pain.  His pain has not improved after 2 rounds of morphine.  He does have an appointment scheduled with urology this upcoming Thursday, advised to follow-up with Dr. Gilford Rile.  Patient without further complaints, vital signs are within normal limits stable for discharge.   Portions of this note were generated with [employer]. Dictation errors may occur despite best attempts at proofreading.  Final Clinical Impressions(s) / ED Diagnoses   Final diagnoses:  Left hydrocele    ED Discharge Orders    None       Janeece Fitting, PA-C 04/27/19 1345    Maudie Flakes, MD 04/28/19 [phone]

## 2019-04-27 NOTE — Discharge Instructions (Addendum)
Your CT today was within normal limits.  Your ultrasound showed your previously diagnosed hydrocele.  I spoke to Dr. Gilford Rile who reported he will see you on an outpatient basis at your schedule appointment on Thursday.  No further management is needed at this time.  May continue taking your tramadol along with completion of antibiotics and schedule an appointment to see them in office.

## 2019-04-27 NOTE — ED Triage Notes (Signed)
Pt presents from home for evaluation of testicle pain that has been occurring since Tuesday after being seen by urology.

## 2019-04-30 DIAGNOSIS — M5136 Other intervertebral disc degeneration, lumbar region: Secondary | ICD-10-CM | POA: Diagnosis not present

## 2019-04-30 DIAGNOSIS — R8279 Other abnormal findings on microbiological examination of urine: Secondary | ICD-10-CM | POA: Diagnosis not present

## 2019-04-30 DIAGNOSIS — M545 Low back pain: Secondary | ICD-10-CM | POA: Diagnosis not present

## 2019-04-30 DIAGNOSIS — N453 Epididymo-orchitis: Secondary | ICD-10-CM | POA: Diagnosis not present

## 2019-04-30 DIAGNOSIS — N50812 Left testicular pain: Secondary | ICD-10-CM | POA: Diagnosis not present

## 2019-04-30 DIAGNOSIS — N431 Infected hydrocele: Secondary | ICD-10-CM | POA: Diagnosis not present

## 2019-05-03 DIAGNOSIS — Z9289 Personal history of other medical treatment: Secondary | ICD-10-CM

## 2019-05-03 HISTORY — DX: Personal history of other medical treatment: Z92.89

## 2019-05-05 ENCOUNTER — Other Ambulatory Visit: Payer: Self-pay | Admitting: Urology

## 2019-05-06 DIAGNOSIS — J441 Chronic obstructive pulmonary disease with (acute) exacerbation: Secondary | ICD-10-CM | POA: Diagnosis not present

## 2019-05-08 NOTE — Patient Instructions (Signed)
DUE TO COVID-19 ONLY ONE VISITOR IS ALLOWED TO COME WITH YOU AND STAY IN THE WAITING ROOM ONLY DURING PRE OP AND PROCEDURE DAY OF SURGERY. THE 1 VISITOR MAY VISIT WITH YOU AFTER SURGERY IN YOUR PRIVATE ROOM DURING VISITING HOURS ONLY!  YOU NEED TO HAVE A COVID 19 TEST ON__11/7_____ @_______ , THIS TEST MUST BE DONE BEFORE SURGERY, COME  [place]  , [id number].  ([place]) ONCE YOUR COVID TEST IS COMPLETED, PLEASE BEGIN THE QUARANTINE INSTRUCTIONS AS OUTLINED IN YOUR HANDOUT.                Gregory Christensen   Your procedure is scheduled on: 05/13/19   Report to [facility] Main  Entrance   Report to admitting at   11:00 AM     Call this number if you have problems the morning of surgery [phone]    Remember: Do not eat food or drink liquids :After Midnight.   BRUSH YOUR TEETH MORNING OF SURGERY AND RINSE YOUR MOUTH OUT, NO CHEWING GUM CANDY OR MINTS.     Take these medicines the morning of surgery with A SIP OF WATER: Amlodipine, Claritin, Flomax, Protonix, use your inhalers as usual and bring them with you to the hospital Bring mask and tubing to the hospital                                 You may not have any metal on your body including piercings              Do not wear jewelry,  lotions, powders or  deodorant                       Men may shave face and neck.   Do not bring valuables to the hospital. [place].  Contacts, dentures or bridgework may not be worn into surgery.     Patients discharged the day of surgery will not be allowed to drive home.   IF YOU ARE HAVING SURGERY AND GOING HOME THE SAME DAY, YOU MUST HAVE AN ADULT TO DRIVE YOU HOME AND BE WITH YOU FOR 24 HOURS . YOU MAY GO HOME BY TAXI OR UBER OR ORTHERWISE, BUT AN ADULT MUST ACCOMPANY YOU HOME AND STAY WITH YOU FOR 24 HOURS.  Name and phone number of your driver:  Special Instructions: N/A              Please  read over the following fact sheets you were given: _____________________________________________________________________             [facility] - Preparing for Surgery  Before surgery, you can play an important role.   Because skin is not sterile, your skin needs to be as free of germs as possible.   You can reduce the number of germs on your skin by washing with CHG (chlorahexidine gluconate) soap before surgery.   CHG is an antiseptic cleaner which kills germs and bonds with the skin to continue killing germs even after washing. Please DO NOT use if you have an allergy to CHG or antibacterial soaps.   If your skin becomes reddened/irritated stop using the CHG and inform your nurse when you arrive at Short Stay. .  You may shave your face/neck.  Please  follow these instructions carefully:  1.  Shower with CHG Soap the night before surgery and the  morning of Surgery.  2.  If you choose to wash your hair, wash your hair first as usual with your  normal  shampoo.  3.  After you shampoo, rinse your hair and body thoroughly to remove the  shampoo.                                        4.  Use CHG as you would any other liquid soap.  You can apply chg directly  to the skin and wash                       Gently with a scrungie or clean washcloth.  5.  Apply the CHG Soap to your body ONLY FROM THE NECK DOWN.   Do not use on face/ open                           Wound or open sores. Avoid contact with eyes, ears mouth and genitals (private parts).                       Wash face,  Genitals (private parts) with your normal soap.             6.  Wash thoroughly, paying special attention to the area where your surgery  will be performed.  7.  Thoroughly rinse your body with warm water from the neck down.  8.  DO NOT shower/wash with your normal soap after using and rinsing off  the CHG Soap.             9.  Pat yourself dry with a clean towel.            10.  Wear clean pajamas.            11.   Place clean sheets on your bed the night of your first shower and do not  sleep with pets. Day of Surgery : Do not apply any lotions/deodorants the morning of surgery.  Please wear clean clothes to the hospital/surgery center.  FAILURE TO FOLLOW THESE INSTRUCTIONS MAY RESULT IN THE CANCELLATION OF YOUR SURGERY PATIENT SIGNATURE_________________________________  NURSE SIGNATURE__________________________________  ________________________________________________________________________

## 2019-05-09 ENCOUNTER — Inpatient Hospital Stay (HOSPITAL_COMMUNITY): Admission: RE | Admit: 2019-05-09 | Payer: Medicare HMO | Source: Ambulatory Visit

## 2019-05-09 NOTE — Progress Notes (Signed)
Patient was postive for CIVD on 02/28/19  Which is less than 90 days from procedure date, patient does not need to be rested

## 2019-05-11 ENCOUNTER — Encounter (HOSPITAL_COMMUNITY): Payer: Self-pay

## 2019-05-11 ENCOUNTER — Other Ambulatory Visit: Payer: Self-pay

## 2019-05-11 ENCOUNTER — Encounter (HOSPITAL_COMMUNITY)
Admission: RE | Admit: 2019-05-11 | Discharge: 2019-05-11 | Disposition: A | Discharge status: Home / Self Care | Payer: Medicare HMO | Source: Ambulatory Visit | Attending: Urology | Admitting: Urology

## 2019-05-11 DIAGNOSIS — N400 Enlarged prostate without lower urinary tract symptoms: Secondary | ICD-10-CM | POA: Diagnosis not present

## 2019-05-11 DIAGNOSIS — Z7901 Long term (current) use of anticoagulants: Secondary | ICD-10-CM | POA: Insufficient documentation

## 2019-05-11 DIAGNOSIS — I10 Essential (primary) hypertension: Secondary | ICD-10-CM | POA: Diagnosis not present

## 2019-05-11 DIAGNOSIS — Z7951 Long term (current) use of inhaled steroids: Secondary | ICD-10-CM | POA: Diagnosis not present

## 2019-05-11 DIAGNOSIS — Z7952 Long term (current) use of systemic steroids: Secondary | ICD-10-CM | POA: Insufficient documentation

## 2019-05-11 DIAGNOSIS — Z79899 Other long term (current) drug therapy: Secondary | ICD-10-CM | POA: Insufficient documentation

## 2019-05-11 DIAGNOSIS — Z01818 Encounter for other preprocedural examination: Secondary | ICD-10-CM | POA: Insufficient documentation

## 2019-05-11 DIAGNOSIS — Z86718 Personal history of other venous thrombosis and embolism: Secondary | ICD-10-CM | POA: Insufficient documentation

## 2019-05-11 DIAGNOSIS — K219 Gastro-esophageal reflux disease without esophagitis: Secondary | ICD-10-CM | POA: Diagnosis not present

## 2019-05-11 DIAGNOSIS — Z86711 Personal history of pulmonary embolism: Secondary | ICD-10-CM | POA: Insufficient documentation

## 2019-05-11 DIAGNOSIS — N433 Hydrocele, unspecified: Secondary | ICD-10-CM | POA: Diagnosis not present

## 2019-05-11 DIAGNOSIS — J439 Emphysema, unspecified: Secondary | ICD-10-CM | POA: Insufficient documentation

## 2019-05-11 DIAGNOSIS — Z87891 Personal history of nicotine dependence: Secondary | ICD-10-CM | POA: Insufficient documentation

## 2019-05-11 DIAGNOSIS — I2510 Atherosclerotic heart disease of native coronary artery without angina pectoris: Secondary | ICD-10-CM | POA: Insufficient documentation

## 2019-05-11 DIAGNOSIS — Z9981 Dependence on supplemental oxygen: Secondary | ICD-10-CM | POA: Diagnosis not present

## 2019-05-11 LAB — BASIC METABOLIC PANEL
Anion gap: 9 (ref 5–15)
BUN: 13 mg/dL (ref 8–23)
CO2: 18 mmol/L — ABNORMAL LOW (ref 22–32)
Calcium: 9.5 mg/dL (ref 8.9–10.3)
Chloride: 109 mmol/L (ref 98–111)
Creatinine, Ser: 1.77 mg/dL — ABNORMAL HIGH (ref 0.61–1.24)
GFR calc Af Amer: 45 mL/min — ABNORMAL LOW (ref >60)
GFR calc non Af Amer: 39 mL/min — ABNORMAL LOW (ref >60)
Glucose, Bld: 96 mg/dL (ref 70–99)
Potassium: 4.9 mmol/L (ref 3.5–5.1)
Sodium: 136 mmol/L (ref 135–145)

## 2019-05-11 LAB — CBC
HCT: 34.4 % — ABNORMAL LOW (ref 39.0–52.0)
Hemoglobin: 10.2 g/dL — ABNORMAL LOW (ref 13.0–17.0)
MCH: 24.3 pg — ABNORMAL LOW (ref 26.0–34.0)
MCHC: 29.7 g/dL — ABNORMAL LOW (ref 30.0–36.0)
MCV: 81.9 fL (ref 80.0–100.0)
Platelets: 417 10*3/uL — ABNORMAL HIGH (ref 150–400)
RBC: 4.2 MIL/uL — ABNORMAL LOW (ref 4.22–5.81)
RDW: 18.9 % — ABNORMAL HIGH (ref 11.5–15.5)
WBC: 6.6 10*3/uL (ref 4.0–10.5)
nRBC: 0 % (ref 0.0–0.2)

## 2019-05-11 NOTE — Progress Notes (Addendum)
PCP - Dr. Edwin Dada Cardiologist - Dr. Rae Halsted  Chest x-ray - 04/13/19 EKG - 03/03/19 Stress Test - no ECHO - 06/01/18 Cardiac Cath -   Sleep Study - yes negative findings CPAP - pt uses home O2 24/7  Fasting Blood Sugar - NA Checks Blood Sugar _____ times a day  Blood Thinner Instructions:Eliquis Aspirin Instructions:Dr. Drema Dallas said stop 5 days prior. Last Dose:05/06/19  Anesthesia review:   Patient denies shortness of breath, fever, cough and chest pain at PAT appointment yes  Patient verbalized understanding of instructions that were given to them at the PAT appointment. Patient was also instructed that they will need to review over the PAT instructions again at home before surgery. Yes Pt was not tested for covid because of the 90 day protocol following a +. Pt has a history of mandible fractures. He has no problems with his jaw and is able to open his mouth wide. Never had problems with intubation.

## 2019-05-12 NOTE — Progress Notes (Signed)
Anesthesia Chart Review   Case: [id number] Date/Time: 05/13/19 1245   Procedure: HYDROCELECTOMY ADULT (Left )   Anesthesia type: General   Pre-op diagnosis: LEFT HYDROCELE   Location: [place] [facility]   Surgeon: Ceasar Mons, MD      DISCUSSION: 68 yo former smoker (26 pack years, 10/18/17) with h/o anxiety, HTN, DVT, PE (11/19 on Eliquis), GERD, COPD (on 2L home O2, followed by pulmonologist, Dr. Gwenette Greet, with the [facility]), CAD, BPH, left hydrocele scheduled for above procedure 05/13/2019 with Dr. Harrell Gave Lovena Neighbours.   S/p nephroureterectomy and cystoscopy 10/31/2018 with no anesthesia complications noted.    Last seen by cardiologist, Dr. Mertie Moores, 02/25/2019 via telemedicine.  Per note, "Coronary artery calcifications: Evern Bio with incidentally  found to have coronary artery calcifications by CT scan.  I saw him as a virtual visit several months ago prior to kidney surgery.  Because of his chronic shortness of breath and the coronary calcifications we performed a Lexiscan Myoview study which revealed no evidence of ischemia and normal left ventricular function.  He was deemed to be low risk for his surgery.  He has had a surgery and did not have any cardiac complications.  He has had some continued problems with swelling in his testicles and inflammation."  Pt tested positive for COVID 02/28/2019 and was admitted 8/29-03/05/2019.  Per testing site nurse, "Patient was postive for CIVD on 02/28/19  Which is less than 90 days from procedure date, patient does not need to be rested" VS: BP (P) 106/60 (BP Location: Right Arm)   Pulse 92   Temp 36.8 C (Oral)   Resp 18   Ht 6\' 1"  (1.854 m)   Wt 66.4 kg   SpO2 100%   BMI 19.31 kg/m   PROVIDERS: Leighton Ruff, MD is PCP   Mertie Moores, MD is Cardiologist   Danton Sewer, MD is pulmonologist  LABS: Labs reviewed: Acceptable for surgery. (all labs ordered are listed, but only abnormal results are displayed)  Labs  Reviewed  CBC - Abnormal; Notable for the following components:      Result Value   RBC 4.20 (*)    Hemoglobin 10.2 (*)    HCT 34.4 (*)    MCH 24.3 (*)    MCHC 29.7 (*)    RDW 18.9 (*)    Platelets 417 (*)    All other components within normal limits  BASIC METABOLIC PANEL - Abnormal; Notable for the following components:   CO2 18 (*)    Creatinine, Ser 1.77 (*)    GFR calc non Af Amer 39 (*)    GFR calc Af Amer 45 (*)    All other components within normal limits     IMAGES:   EKG: 08/18/18 Rate 91 bpm Sinus rhythm  No significant change since last tracing    CV: 10/17/2018  Nuclear stress EF: 71%.  Normal perfusion with minimal soft tissue attenuation. No ischemia or scar  This is a low risk study.  Echo 06/01/18 Study Conclusions  - Left ventricle: The cavity size was normal. There was mild focal basal hypertrophy of the septum. Systolic function was normal. The estimated ejection fraction was in the range of 55% to 60%. Wall motion was normal; there were no regional wall motion abnormalities. Doppler parameters are consistent with abnormal left ventricular relaxation (grade 1 diastolic dysfunction). - Right ventricle: The cavity size was normal. Wall thickness was normal. Past Medical History:  Diagnosis Date  . Acute deep vein  thrombosis (DVT) of left lower extremity ([place])   . Acute hypoxemic respiratory failure ([place])   . Anxiety   . Aortic atherosclerosis ([place])   . Arthritis   . Benign prostatic hyperplasia 08/28/2018   UNSPECIFIED WHETHER LOWER URINARY TRACT SYMPTOMS PRESENT  . CAD (coronary artery disease)   . Chronic back pain   . Chronic hip pain   . COPD exacerbation ([place]) 10/18/2017  . Emphysema lung ([place])   . Essential hypertension   . GERD (gastroesophageal reflux disease)   . Gout   . Gross hematuria 09/08/2018  . History of tobacco abuse   . Hypoxia   . Pulmonary embolism ([place]) 05/31/2018  . Renal mass    CONCERNING  FOR RENAL CELL CARCINOMA DR. Lovena Neighbours  . Respiratory distress   . Respiratory failure, acute-on-chronic ([place]) 08/03/2018  . Seasonal allergies   . Sleep apnea     Past Surgical History:  Procedure Laterality Date  . CYSTOSCOPY/URETEROSCOPY/HOLMIUM LASER/STENT PLACEMENT Left 10/31/2018   Procedure: CYSTOSCOPY/URETEROSCOPY;  Surgeon: Ceasar Mons, MD;  Location: [facility];  Service: Urology;  Laterality: Left;  . HERNIA REPAIR  1977  . MANDIBLE SURGERY  1976   EXTENSIVE  . ROBOT ASSITED LAPAROSCOPIC NEPHROURETERECTOMY Left 10/31/2018   Procedure: XI ROBOT ASSITED LAPAROSCOPIC NEPHROURETERECTOMY;  Surgeon: Ceasar Mons, MD;  Location: [facility];  Service: Urology;  Laterality: Left;  ONLY NEEDS 240 MIN FOR ALL PROCEDURES    MEDICATIONS: . acetaminophen (TYLENOL) 500 MG tablet  . albuterol (PROVENTIL HFA;VENTOLIN HFA) 108 (90 Base) MCG/ACT inhaler  . albuterol (PROVENTIL) (2.5 MG/3ML) 0.083% nebulizer solution  . amLODipine (NORVASC) 2.5 MG tablet  . apixaban (ELIQUIS) 5 MG TABS tablet  . budesonide-formoterol (SYMBICORT) 160-4.5 MCG/ACT inhaler  . docusate sodium (COLACE) 100 MG capsule  . fluticasone (FLONASE) 50 MCG/ACT nasal spray  . HYDROcodone-acetaminophen (NORCO/VICODIN) 5-325 MG tablet  . hydroxypropyl methylcellulose / hypromellose (ISOPTO TEARS / GONIOVISC) 2.5 % ophthalmic solution  . loratadine (CLARITIN) 10 MG tablet  . montelukast (SINGULAIR) 10 MG tablet  . pantoprazole (PROTONIX) 40 MG tablet  . predniSONE (DELTASONE) 5 MG tablet  . sulfamethoxazole-trimethoprim (BACTRIM DS) 800-160 MG tablet  . tamsulosin (FLOMAX) 0.4 MG CAPS capsule  . Tiotropium Bromide Monohydrate (SPIRIVA RESPIMAT) 1.25 MCG/ACT AERS  . traMADol (ULTRAM) 50 MG tablet   No current facility-administered medications for this encounter.       Maia Plan [facility] Pre-Surgical Testing [phone] 05/12/19  12:59 PM

## 2019-05-13 ENCOUNTER — Encounter (HOSPITAL_COMMUNITY): Payer: Self-pay

## 2019-05-13 ENCOUNTER — Other Ambulatory Visit: Payer: Self-pay

## 2019-05-13 ENCOUNTER — Encounter (HOSPITAL_COMMUNITY)
Admission: RE | Disposition: A | Discharge status: Home / Self Care | Payer: Self-pay | Source: Home / Self Care | Attending: Urology

## 2019-05-13 ENCOUNTER — Ambulatory Visit (HOSPITAL_COMMUNITY): Payer: Medicare HMO | Admitting: Physician Assistant

## 2019-05-13 ENCOUNTER — Ambulatory Visit (HOSPITAL_COMMUNITY)
Admission: RE | Admit: 2019-05-13 | Discharge: 2019-05-13 | Disposition: A | Discharge status: Home / Self Care | Payer: Medicare HMO | Attending: Urology | Admitting: Urology

## 2019-05-13 ENCOUNTER — Ambulatory Visit (HOSPITAL_COMMUNITY): Payer: Medicare HMO | Admitting: Certified Registered"

## 2019-05-13 DIAGNOSIS — Z7901 Long term (current) use of anticoagulants: Secondary | ICD-10-CM | POA: Insufficient documentation

## 2019-05-13 DIAGNOSIS — Z905 Acquired absence of kidney: Secondary | ICD-10-CM | POA: Diagnosis not present

## 2019-05-13 DIAGNOSIS — N454 Abscess of epididymis or testis: Secondary | ICD-10-CM | POA: Diagnosis not present

## 2019-05-13 DIAGNOSIS — N433 Hydrocele, unspecified: Secondary | ICD-10-CM | POA: Diagnosis not present

## 2019-05-13 DIAGNOSIS — N401 Enlarged prostate with lower urinary tract symptoms: Secondary | ICD-10-CM | POA: Insufficient documentation

## 2019-05-13 DIAGNOSIS — Z86718 Personal history of other venous thrombosis and embolism: Secondary | ICD-10-CM | POA: Insufficient documentation

## 2019-05-13 DIAGNOSIS — Z9981 Dependence on supplemental oxygen: Secondary | ICD-10-CM | POA: Diagnosis not present

## 2019-05-13 DIAGNOSIS — Z8249 Family history of ischemic heart disease and other diseases of the circulatory system: Secondary | ICD-10-CM | POA: Insufficient documentation

## 2019-05-13 DIAGNOSIS — R351 Nocturia: Secondary | ICD-10-CM | POA: Diagnosis not present

## 2019-05-13 DIAGNOSIS — G4730 Sleep apnea, unspecified: Secondary | ICD-10-CM | POA: Diagnosis not present

## 2019-05-13 DIAGNOSIS — I2510 Atherosclerotic heart disease of native coronary artery without angina pectoris: Secondary | ICD-10-CM | POA: Diagnosis not present

## 2019-05-13 DIAGNOSIS — Z8553 Personal history of malignant neoplasm of renal pelvis: Secondary | ICD-10-CM | POA: Diagnosis not present

## 2019-05-13 DIAGNOSIS — I10 Essential (primary) hypertension: Secondary | ICD-10-CM | POA: Diagnosis not present

## 2019-05-13 DIAGNOSIS — J449 Chronic obstructive pulmonary disease, unspecified: Secondary | ICD-10-CM | POA: Diagnosis not present

## 2019-05-13 DIAGNOSIS — Z79899 Other long term (current) drug therapy: Secondary | ICD-10-CM | POA: Diagnosis not present

## 2019-05-13 DIAGNOSIS — J188 Other pneumonia, unspecified organism: Secondary | ICD-10-CM | POA: Diagnosis not present

## 2019-05-13 DIAGNOSIS — U071 COVID-19: Secondary | ICD-10-CM | POA: Diagnosis not present

## 2019-05-13 DIAGNOSIS — N498 Inflammatory disorders of other specified male genital organs: Secondary | ICD-10-CM | POA: Diagnosis not present

## 2019-05-13 DIAGNOSIS — N431 Infected hydrocele: Secondary | ICD-10-CM | POA: Diagnosis not present

## 2019-05-13 HISTORY — PX: HYDROCELE EXCISION: SHX482

## 2019-05-13 SURGERY — HYDROCELECTOMY
Anesthesia: General | Site: Scrotum | Laterality: Left

## 2019-05-13 MED ORDER — FENTANYL CITRATE (PF) 100 MCG/2ML IJ SOLN
INTRAMUSCULAR | Status: DC | PRN
  Administered 2019-05-13 (×2): 25 ug via INTRAVENOUS
  Administered 2019-05-13: 50 ug via INTRAVENOUS
  Administered 2019-05-13: 25 ug via INTRAVENOUS

## 2019-05-13 MED ORDER — BUPIVACAINE HCL (PF) 0.5 % IJ SOLN
INTRAMUSCULAR | Status: AC
  Filled 2019-05-13: qty 30

## 2019-05-13 MED ORDER — PHENYLEPHRINE 40 MCG/ML (10ML) SYRINGE FOR IV PUSH (FOR BLOOD PRESSURE SUPPORT)
PREFILLED_SYRINGE | INTRAVENOUS | Status: DC | PRN
  Administered 2019-05-13 (×4): 80 ug via INTRAVENOUS

## 2019-05-13 MED ORDER — ONDANSETRON HCL 4 MG/2ML IJ SOLN
INTRAMUSCULAR | Status: AC
  Filled 2019-05-13: qty 2

## 2019-05-13 MED ORDER — ONDANSETRON HCL 4 MG/2ML IJ SOLN: 4.0000 mg | Freq: Four times a day (QID) | INTRAMUSCULAR | Status: DC | PRN

## 2019-05-13 MED ORDER — OXYCODONE HCL 5 MG PO TABS
5.0000 mg | ORAL_TABLET | Freq: Once | ORAL | Status: AC | PRN
  Administered 2019-05-13: 14:00:00 5 mg via ORAL

## 2019-05-13 MED ORDER — FENTANYL CITRATE (PF) 100 MCG/2ML IJ SOLN
INTRAMUSCULAR | Status: AC
  Filled 2019-05-13: qty 2

## 2019-05-13 MED ORDER — BUPIVACAINE-EPINEPHRINE (PF) 0.5% -1:200000 IJ SOLN
INTRAMUSCULAR | Status: AC
  Filled 2019-05-13: qty 1.8

## 2019-05-13 MED ORDER — LIDOCAINE 2% (20 MG/ML) 5 ML SYRINGE
INTRAMUSCULAR | Status: DC | PRN
  Administered 2019-05-13: 50 mg via INTRAVENOUS

## 2019-05-13 MED ORDER — OXYCODONE HCL 5 MG/5ML PO SOLN: 5.0000 mg | Freq: Once | ORAL | Status: AC | PRN

## 2019-05-13 MED ORDER — PROPOFOL 10 MG/ML IV BOLUS
INTRAVENOUS | Status: DC | PRN
  Administered 2019-05-13: 150 mg via INTRAVENOUS

## 2019-05-13 MED ORDER — LACTATED RINGERS IV SOLN
INTRAVENOUS | Status: DC
  Administered 2019-05-13: 12:00:00 via INTRAVENOUS

## 2019-05-13 MED ORDER — ONDANSETRON HCL 4 MG/2ML IJ SOLN
INTRAMUSCULAR | Status: DC | PRN
  Administered 2019-05-13: 4 mg via INTRAVENOUS

## 2019-05-13 MED ORDER — OXYCODONE HCL 5 MG PO TABS
ORAL_TABLET | ORAL | Status: AC
  Administered 2019-05-13: 5 mg via ORAL
  Filled 2019-05-13: qty 1

## 2019-05-13 MED ORDER — EPHEDRINE SULFATE-NACL 50-0.9 MG/10ML-% IV SOSY
PREFILLED_SYRINGE | INTRAVENOUS | Status: DC | PRN
  Administered 2019-05-13 (×2): 10 mg via INTRAVENOUS

## 2019-05-13 MED ORDER — DEXAMETHASONE SODIUM PHOSPHATE 10 MG/ML IJ SOLN
INTRAMUSCULAR | Status: DC | PRN
  Administered 2019-05-13: 4 mg via INTRAVENOUS

## 2019-05-13 MED ORDER — CEFAZOLIN SODIUM-DEXTROSE 2-4 GM/100ML-% IV SOLN
2.0000 g | Freq: Once | INTRAVENOUS | Status: AC
  Administered 2019-05-13: 2 g via INTRAVENOUS
  Filled 2019-05-13: qty 100

## 2019-05-13 MED ORDER — EPHEDRINE 5 MG/ML INJ
INTRAVENOUS | Status: AC
  Filled 2019-05-13: qty 10

## 2019-05-13 MED ORDER — PROPOFOL 10 MG/ML IV BOLUS
INTRAVENOUS | Status: AC
  Filled 2019-05-13: qty 20

## 2019-05-13 MED ORDER — SULFAMETHOXAZOLE-TRIMETHOPRIM 800-160 MG PO TABS: 1.0000 | ORAL_TABLET | Freq: Two times a day (BID) | ORAL | 0 refills | Status: DC

## 2019-05-13 MED ORDER — FENTANYL CITRATE (PF) 100 MCG/2ML IJ SOLN: 25.0000 ug | INTRAMUSCULAR | Status: DC | PRN

## 2019-05-13 MED ORDER — HYDROCODONE-ACETAMINOPHEN 5-325 MG PO TABS: 1.0000 | ORAL_TABLET | ORAL | 0 refills | Status: DC | PRN

## 2019-05-13 MED ORDER — DEXAMETHASONE SODIUM PHOSPHATE 10 MG/ML IJ SOLN
INTRAMUSCULAR | Status: AC
  Filled 2019-05-13: qty 1

## 2019-05-13 MED ORDER — BUPIVACAINE HCL (PF) 0.5 % IJ SOLN
INTRAMUSCULAR | Status: DC | PRN
  Administered 2019-05-13: 15 mL

## 2019-05-13 MED ORDER — PHENYLEPHRINE 40 MCG/ML (10ML) SYRINGE FOR IV PUSH (FOR BLOOD PRESSURE SUPPORT)
PREFILLED_SYRINGE | INTRAVENOUS | Status: AC
  Filled 2019-05-13: qty 10

## 2019-05-13 MED ORDER — LIDOCAINE 2% (20 MG/ML) 5 ML SYRINGE
INTRAMUSCULAR | Status: AC
  Filled 2019-05-13: qty 5

## 2019-05-13 MED ORDER — 0.9 % SODIUM CHLORIDE (POUR BTL) OPTIME
TOPICAL | Status: DC | PRN
  Administered 2019-05-13: 1000 mL

## 2019-05-13 SURGICAL SUPPLY — 30 items
BLADE HEX COATED 2.75 (ELECTRODE) ×3 IMPLANT
BNDG GAUZE ELAST 4 BULKY (GAUZE/BANDAGES/DRESSINGS) ×3 IMPLANT
COVER SURGICAL LIGHT HANDLE (MISCELLANEOUS) ×3 IMPLANT
COVER WAND RF STERILE (DRAPES) IMPLANT
DERMABOND ADVANCED (GAUZE/BANDAGES/DRESSINGS) ×2
DERMABOND ADVANCED .7 DNX12 (GAUZE/BANDAGES/DRESSINGS) ×1 IMPLANT
DISSECTOR ROUND CHERRY 3/8 STR (MISCELLANEOUS) ×3 IMPLANT
DRAIN PENROSE 18X1/2 LTX STRL (DRAIN) ×3 IMPLANT
DRAIN PENROSE 18X1/4 LTX STRL (WOUND CARE) ×3 IMPLANT
DRAPE LAPAROTOMY T 98X78 PEDS (DRAPES) ×3 IMPLANT
ELECT NEEDLE TIP 2.8 STRL (NEEDLE) ×3 IMPLANT
ELECT REM PT RETURN 15FT ADLT (MISCELLANEOUS) ×3 IMPLANT
GLOVE BIOGEL M STRL SZ7.5 (GLOVE) ×3 IMPLANT
GOWN STRL REUS W/TWL LRG LVL3 (GOWN DISPOSABLE) ×3 IMPLANT
KIT BASIN OR (CUSTOM PROCEDURE TRAY) ×3 IMPLANT
KIT TURNOVER KIT A (KITS) IMPLANT
NEEDLE HYPO 22GX1.5 SAFETY (NEEDLE) ×3 IMPLANT
NS IRRIG 1000ML POUR BTL (IV SOLUTION) ×3 IMPLANT
PACK GENERAL/GYN (CUSTOM PROCEDURE TRAY) ×3 IMPLANT
SUPPORT SCROTAL LG STRP (MISCELLANEOUS) ×2 IMPLANT
SUPPORTER ATHLETIC LG (MISCELLANEOUS) ×1
SUT CHROMIC 2 0 SH (SUTURE) ×3 IMPLANT
SUT MNCRL AB 4-0 PS2 18 (SUTURE) ×3 IMPLANT
SUT SILK 0 SH 30 (SUTURE) IMPLANT
SUT VIC AB 2-0 SH 27 (SUTURE) ×2
SUT VIC AB 2-0 SH 27X BRD (SUTURE) ×1 IMPLANT
SUT VICRYL 0 UR6 27IN ABS (SUTURE) ×3 IMPLANT
SYR CONTROL 10ML LL (SYRINGE) ×3 IMPLANT
TOWEL OR 17X26 10 PK STRL BLUE (TOWEL DISPOSABLE) ×3 IMPLANT
WATER STERILE IRR 1000ML POUR (IV SOLUTION) ×3 IMPLANT

## 2019-05-13 NOTE — Op Note (Signed)
Operative Note  Preoperative diagnosis:  1.  Left 1.5 cm peritesticular abscess 2.  Left hydrocele  Postoperative diagnosis: 1.  Left 1.5 cm peritesticular abscess 2.  Left hydrocele  Procedure(s): 1.  Left hydrocelectomy 2.  Excision of left peritesticular abscess  Surgeon: Ellison Hughs, MD  Assistants:  None  Anesthesia:  General  Complications:  None  EBL: 10 mL  Specimens: 1.  Left peritesticular phlegmon  Drains/Catheters: 1.  Half inch Penrose drain in the dependent portion of the left hemiscrotum  Intraoperative findings:   1. Small left hydrocele 2. 1.5 cm peritesticular abscess adjacent to the tail of the epididymis  Indication:  Gregory Christensen is a 68 y.o. 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  His most recent scrotal ultrasound showed a small 1.5 cm left-sided peritesticular fluid collection concerning for an abscess along with a small left-sided hydrocele.  He has been consented for the above procedures, voices understanding and wishes to proceed.  Description of procedure:  After informed consent was obtained, the patient was brought to the operating room and general anesthesia was administered.  The patient was placed in the supine position and prepped and draped in usual sterile fashion.  A timeout was then performed.  A 3 cm vertical incision was then made along the midportion of the median raphae.  The overlying cremasteric and dartos layers were then incised using electrocautery the left testicle was then expressed from the left hemiscrotum.  The small hydrocele was incised, draining approximately 20 mL of clear fluid.  The small amount of residual hydrocele sac was then reapproximated using the Lord technique.  A 1.5 cm phlegmon could be palpated at the lower pole of the left testicle.  This was sharply incised, immediately draining a small amount of purulent fluid.  The  entire phlegmon was then excised using electrocautery.  The left testicle was then placed back into the left hemiscrotum, assuring that it was in the correct anatomic orientation and not torsed.  A Penrose drain was then placed in the dependent portion of the left hemiscrotum and secured with a 2-0 chromic suture.  The scrotal incision was then closed in 2 layers and dressed with Dermabond.  He tolerated the procedure well and was transferred to the postanesthesia in stable condition.  Plan: Follow-up on 05/15/2019 for Penrose drain removal

## 2019-05-13 NOTE — Transfer of Care (Signed)
Immediate Anesthesia Transfer of Care Note  Patient: Gregory Christensen  Procedure(s) Performed: HYDROCELECTOMY ADULT (Left Scrotum)  Patient Location: PACU  Anesthesia Type:General  Level of Consciousness: awake, alert  and oriented  Airway & Oxygen Therapy: Patient Spontanous Breathing and Patient connected to face mask oxygen  Post-op Assessment: Report given to RN and Post -op Vital signs reviewed and stable  Post vital signs: Reviewed and stable  Last Vitals:  Vitals Value Taken Time  BP 136/77 05/13/19 1345  Temp    Pulse 63 05/13/19 1347  Resp    SpO2 100 % 05/13/19 1347  Vitals shown include unvalidated device data.  Last Pain:  Vitals:   05/13/19 1115  TempSrc: Oral         Complications: No apparent anesthesia complications

## 2019-05-13 NOTE — H&P (Signed)
Pre-op H&P  CC:  Left testicular pain  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   11/05/18: He is here today for a routine postop check. He states that aside from a 2 day history of left testicular pain, he has felt very good after surgery. He is tolerating a regular diet, having BMs and ambulating multiple times per day. He does report mild dysuria that started around the same time as his testicular pain, but denies hematuria or fever/chills. Overall, he is doing well.   11/27/18: Gregory Christensen presents today with a 2 week history of constant, sharp, non-radiating left testicular pain. He denies recent trauma to the genitals, flank pain, dysuria, hematuria, kidney stones, fever/chills or nausea/vomiting. From a urinary standpoint, he reports a good FOS and feels like he is emptying his bladder well. He has occasional urgency/frequency, but is not bothered by it. Nocturia x 1. He takes tamsulosin once daily. UA clear today.   He recently had a CT w/ PO contrast due to post-op abdominal pain that was WNL.   12/17/2018: 6 week f/u from his left robotic nephroureterectomy. At last visit, Dr Lovena Neighbours provided Cephelaxin for left orchitis. His UA was clear. All taking his antibiotic he had improvement of pain in the left testicle but one he stopped the medication pain returned. He states it intermittently swells as well. From a voiding standpoint he reports good force of stream and feels he is injecting appropriately. Non-bothersome frequency/urgency, nocturia 1. He continues tamsulosin. No burning or painful urination, gross hematuria. Denies any new or worsening abdominal pain/discomfort. Tolerating a normal diet without nausea/vomiting. Denies constipation.   12/25/18: The patient is back again today with left testicular pain. He was seen in the ED on 12/23/18 and had an unremarkable CT at that time. He is on  the last day of levaquin and reports persistent, dull left testicular pain that radiates into the low back. UA is clear today. He denies any episodes of dysuria, nausea, vomiting, fever, chills or hematuria. He states that morphine injections alleviate his pain and NSAIDs provide marginal benefit. He also states that he is tolerating a regular diet and having BMs q 1-2 days. He does admit to having a long history of lower back and hip pain.   01/28/19: Patient with above noted hx. He presents today with acute onset left testicular pain that radiates to his left lower quadrant, which began this morning. He describes the pain as a constant sharp and shooting pain. He also notes scrotal edema. He states that symptoms did eventually subside following completion of Levaquin. He denies exacerbation of voiding symptoms, dysuria, gross hematuria, fevers, chills, nausea, or vomiting. He states that his abdominal incision have healed well. He is having normal bowel movements.   02/11/19: Patient with above-noted history. He was treated empirically at prior office visit for possible epididymo-orchitis with 10 day course of Levaquin given previous history. He returns today for follow-up. Ultimately, his urine culture was negative at that time. Today he states that symptoms have improved following treatment course of Levaquin. He continues to have some ongoing pain in his left scrotum and testicle, but states that this is only mild. He also notes intermittent radiation of pain to his left lower quadrant. He denies any scrotal edema or erythema. He denies fevers or chills. No significant changes in voiding. No hematuria or dysuria. No changes in bowel habits.  02/18/19: Patient with above-noted history. He returns today with persistent symptoms. He was treated on 07/29 for possible epididymo-orchitis with 10 day course of Levaquin. He followed up on 08/12 with noted improvement, though continued to have some mild pain. His  exam has been fairly unremarkable and evaluation with pelvic floor PT was recommended. He is scheduled for PT evaluation on 8/31. He has had both CT and scrotal imaging performed recently. Today he presents with complaints of more acute onset pain and scrotal edema, which began last night. He states that pain today is more intense than it has been in radiates into his left lower quadrant. He also complains of some weakening of his urinary stream and intermittency of his stream. However, he feels he is able to empty adequately. He denies dysuria or gross hematuria. He denies fevers or chills. No nausea or vomiting. He is having normal bowel movements.   02/23/2019: Patient was demonstrating recurrence of left epididymoorchitis at last office visit. Levaquin represcribed at time of that assessment. He required emergency department evaluation on 8/20 for ongoing pain and discomfort to the affected area. Repeat CT imaging and Scrotal u/s were performed and impression annotated below. The patient received Rocephin in the emergency department and was changed from Levaquin to doxycycline. He then returned to the ED on 08/22 with continued left testicular pain/swelling. A repeat scrotal u/s showed no changes from assessment on 08/20. EDP then changed abx coverage back to levaquin for better penetration to the GU structures in the setting of epididymoorchitis.   Scrotal u/s from 08/20:  IMPRESSION:  1. Negative for acute testicular torsion  2. Enlarged heterogeneous and hyperemic left epididymis suggesting  epididymitis,. Slightly worsened as compared with scrotal ultrasound  from June 2020. No abscess. Small left hydrocele containing  scattered particular matter.   Today he continues with pain and swelling to the left testicle. Pain can radiate into the left groin and down the left leg. Hydrocodone has helped some. He has been alternating ice and heat. He has been using hydrocodone with acetaminophen every 4  hours to help augment pain and discomfort. Denies burning or painful urination, gross hematuria. Denies any new problems starting or stopping his stream or associated increased frequency/urgency. Afebrile today, he denies nausea/vomiting.   04/17/19: Gregory Christensen is here today for a routine OV and surveillance cystoscopy. CTU was WNL. He has been dealing with left testicular pain since his surgery in May (outlined above). Pain emanates from his lower back and radiates down the posterior aspects of his left LE and into his left testicle. No prior back surgeries. He states that he has seen a chiropractor multiple times over the past year for "back adjustments. From a urinary standpoint, he reports a good FOS with tamsulosin and feels like he is emptying his bladder well. He denies interval episodes of flank pain, dysuria or gross hematuria. UA today is clear.   04/22/2019: Seen yesterday in the ED for exacerbation of left testicle pain. EDP exam noted testicular tenderness on exam but no concerning palpable abnormalities other than a previously noted left hydrocele. He received morphine which resolved his pain. Prescription narcotics were not provided. Urology f/u was recommended.  He continues to have severe pain and swelling in the left testicle. Pain radiates into the left lower quadrant of the abdomen. No associated increased or worsening LUTs. He denies visible blood in the urine or dysuria. He denies f/c, n/v.   04/30/19: Gregory Christensen is back with worsening left testicular pain. He was  seen in the ED on 04/27/19 complaining of lower back pain with radiation down his LLE and left testicular pain. SUS showed a possible 1.5 cm abscess and enlargement of his hydrocele. CTSS was WNL. He is voiding w/o difficulty and denies dysuria or hematuria.     ALLERGIES: None   MEDICATIONS: Levaquin 500 mg tablet 1 tablet PO Daily  Tamsulosin Hcl 0.4 mg capsule  Acetaminophen 500 mg tablet  Albuterol Sulfate  Amlodipine  Besylate 2.5 mg tablet  Apixaban  Budesonide-Formoterol Fumarate 160 mcg-4.5 mcg/actuation hfa aerosol with adapter  Gabapentin 300 mg capsule  Hydrocodone-Acetaminophen 5 mg-325 mg tablet 1 tablet PO Q 4 H PRN  Ketotifen Fumarate 0.025 % (0.035 %) drops  Montelukast Sodium 10 mg tablet  Promethazine Hcl 12.5 mg tablet  Spiriva Respimat 2.5 mcg/actuation mist inhaler  Tramadol Hcl 50 mg tablet 1 tablet PO Q 6 H PRN     GU PSH: Cystoscopy - 04/17/2019, 09/19/2018 Lap Nephro Ureterectomy, Left - 10/31/2018 Locm 300-399Mg /Ml Iodine,1Ml - 04/13/2019, 09/26/2018, 09/12/2018     NON-GU PSH: Extensive Jaw Surgery - 1976 Hernia Repair - 1977     GU PMH: Low back pain - 04/17/2019 Epididymo-orchitis - 02/18/2019 Left testicular pain - 11/05/2018 Renal pelvis cancer, left, [id number] urothelial carcinoma of the left renal pelvis - 11/05/2018, - 09/19/2018 BPH w/LUTS - 09/08/2018 Gross hematuria - 09/08/2018      PMH Notes: COPD    NON-GU PMH: Pulmonary Embolism, History - 09/19/2018 Anxiety Arthritis GERD Gout Hypertension Sleep Apnea    FAMILY HISTORY: 1 Daughter - Runs in Family 1 son - Runs in Family Diabetes - Mother father deceased - Runs in Family Hypertension - Sister, Brother patient's mother is still living - Runs in Family   SOCIAL HISTORY: Marital Status: Single Preferred Language: English; Ethnicity: Not Hispanic Or Latino; Race: Black or African American Current Smoking Status: Patient smokes. Has smoked since 08/30/1968. Smokes 2 packs per day.   Tobacco Use Assessment Completed: Used Tobacco in last 30 days? Does not use smokeless tobacco. Drinks 2 caffeinated drinks per day. Patient's occupation is/was Retired.    REVIEW OF SYSTEMS:    GU Review Male:   Patient reports get up at night to urinate. Patient denies frequent urination, hard to postpone urination, burning/ pain with urination, leakage of urine, stream starts and stops, trouble starting your stream, have to  strain to urinate , erection problems, and penile pain.  Gastrointestinal (Upper):   Patient denies nausea, vomiting, and indigestion/ heartburn.  Gastrointestinal (Lower):   Patient denies constipation and diarrhea.  Constitutional:   Patient denies fever, night sweats, weight loss, and fatigue.  Skin:   Patient denies skin rash/ lesion and itching.  Eyes:   Patient denies blurred vision and double vision.  Ears/ Nose/ Throat:   Patient denies sore throat and sinus problems.  Hematologic/Lymphatic:   Patient denies swollen glands and easy bruising.  Cardiovascular:   Patient reports chest pains. Patient denies leg swelling.  Respiratory:   Patient reports shortness of breath. Patient denies cough.  Endocrine:   Patient denies excessive thirst.  Musculoskeletal:   Patient reports back pain. Patient denies joint pain.  Neurological:   Patient denies headaches and dizziness.  Psychologic:   Patient denies depression and anxiety.   Notes: pt gets up at night 10 times, L side flank pain and lower abdomen pain     VITAL SIGNS:      04/30/2019 08:33 AM  Weight 153 lb / 69.4 kg  Height  73 in / 185.42 cm  BP 101/67 mmHg  Heart Rate 108 /min  Temperature 97.1 F / 36.1 C  BMI 20.2 kg/m   GU PHYSICAL EXAMINATION:    Scrotum: No lesions. No edema. No cysts. No warts.   Epididymides: Left: left tail tender, left tail indurated, left tail enlarged. Right: No spermatocele, no masses, no cysts, no tenderness, no induration, no enlargement. Left: No spermatocele, no masses, no cysts.   Testes: Tender left testis, swollen, enlarged. 5+ cm hydrocele left testis. No tenderness, no swelling, no enlargement right testis. Normal location left testis. Normal location right testis. No mass, no cyst, no varicocele left testis. No mass, no cyst, no varicocele, no hydrocele right testis.   Urethral Meatus: Normal size. No lesion, no wart, no discharge, no polyp. Normal location.  Penis: Circumcised, no warts,  no cracks. No dorsal Peyronie's plaques, no left corporal Peyronie's plaques, no right corporal Peyronie's plaques, no scarring, no warts. No balanitis, no meatal stenosis.   MULTI-SYSTEM PHYSICAL EXAMINATION:    Constitutional: Well-nourished. No physical deformities. Normally developed. Good grooming.  Neck: Neck symmetrical, not swollen. Normal tracheal position.  Respiratory: No labored breathing, no use of accessory muscles.   Cardiovascular: Normal temperature, normal extremity pulses, no swelling, no varicosities.  Skin: No paleness, no jaundice, no cyanosis. No lesion, no ulcer, no rash.  Neurologic / Psychiatric: Oriented to time, oriented to place, oriented to person. No depression, no anxiety, no agitation.  Gastrointestinal: No mass, no tenderness, no rigidity, non obese abdomen.  Eyes: Normal conjunctivae. Normal eyelids.  Musculoskeletal: Normal gait and station of head and neck.     PAST DATA REVIEWED:  Source Of History:  Patient   09/08/18  PSA  Total PSA 2.08 ng/mL   Notes:                     CLINICAL DATA: Scrotal swelling.   EXAM:  SCROTAL ULTRASOUND   DOPPLER ULTRASOUND OF THE TESTICLES   TECHNIQUE:  Complete ultrasound examination of the testicles, epididymis, and  other scrotal structures was performed. Color and spectral Doppler  ultrasound were also utilized to evaluate blood flow to the  testicles.   COMPARISON: CT scan of same day. Ultrasound of February 21, 2019.   FINDINGS:  Right testicle   Measurements: 3.8 x 2.1 x 2.1 cm. No mass or microlithiasis  visualized.   Left testicle   Measurements: 2.7 x 2.2 x 2.0 cm. 5 mm cyst is noted which is not  significantly changed compared to prior exam. No mass or  microlithiasis visualized.   Right epididymis: Normal in size and appearance.   Left epididymis: There is again noted increased vascular flow on  Doppler involving the epididymal tail with continued heterogeneous  enlargement of the  epididymal tail, but overall this appears to be  decreased compared to prior exam suggesting improving inflammation.  There is noted 1.5 cm lobulated complex abnormality within this area  which is not visualized on prior exam, and may represent complex  fluid collection such as abscess or less likely neoplasm.   Hydrocele: Septated hydrocele remains.   Varicocele: None visualized.   Pulsed Doppler interrogation of both testes demonstrates normal low  resistance arterial and venous waveforms bilaterally.   IMPRESSION:  There is no evidence of testicular torsion.   There is continued prominence and heterogeneous enlargement of the  left epididymal tail with hypervascular flow based on Doppler,  although it appears to be decreased compared to prior  exam,  suggesting improving inflammation. However, there is now noted 1.5  cm lobulated complex abnormality in this area which was not  visualized on prior exam, and may represent complex fluid collection  such as abscess, or less likely neoplasm. Stable presence of  septated left hydrocele.    Electronically Signed  By: Marijo Conception M.D.  On: 04/27/2019 11:40   CLINICAL DATA: Flank pain.     EXAM:  CT ABDOMEN AND PELVIS WITHOUT CONTRAST     TECHNIQUE:  Multidetector CT imaging of the abdomen and pelvis was performed  following the standard protocol without IV contrast.     COMPARISON: April 13, 2019. February 21, 2019.     FINDINGS:  Lower chest: No acute abnormality.     Hepatobiliary: No focal liver abnormality is seen. No gallstones,  gallbladder wall thickening, or biliary dilatation.     Pancreas: Unremarkable. No pancreatic ductal dilatation or  surrounding inflammatory changes.     Spleen: Normal in size without focal abnormality.     Adrenals/Urinary Tract: Status post left nephrectomy. Left adrenal  gland is not well visualized. Right adrenal gland appears normal.  Right kidney and ureter are unremarkable. No  hydronephrosis or renal  obstruction is noted. No renal or ureteral calculi are noted.  Urinary bladder is unremarkable.     Stomach/Bowel: Stomach is within normal limits. Appendix appears  normal. No evidence of bowel wall thickening, distention, or  inflammatory changes.     Vascular/Lymphatic: Aortic atherosclerosis. No enlarged abdominal or  pelvic lymph nodes.     Reproductive: Stable mild prostatic enlargement is noted. Stable  left hydrocele is noted.     Other: No abdominal wall hernia or abnormality. No abdominopelvic  ascites.     Musculoskeletal: No acute or significant osseous findings.     IMPRESSION:  Status post left nephrectomy.     No hydronephrosis or renal obstruction is noted.     Stable left hydrocele is noted as noted on prior studies.     Stable mild prostatic enlargement.     No acute abnormality seen in the abdomen or pelvis.     Aortic Atherosclerosis (ICD10-I70.0).        Electronically Signed  By: Marijo Conception M.D.  On: 04/27/2019 09:42      PROCEDURES:          Urinalysis w/Scope Dipstick Dipstick Cont'd Micro  Color: Yellow Bilirubin: Neg mg/dL WBC/hpf: 10 - 20/hpf  Appearance: Clear Ketones: Neg mg/dL RBC/hpf: 3 - 10/hpf  Specific Gravity: 1.025 Blood: Neg ery/uL Bacteria: NS (Not Seen)  pH: 6.0 Protein: 1+ mg/dL Cystals: NS (Not Seen)  Glucose: Neg mg/dL Urobilinogen: 0.2 mg/dL Casts: NS (Not Seen)    Nitrites: Neg Trichomonas: Not Present    Leukocyte Esterase: 3+ leu/uL Mucous: Not Present      Epithelial Cells: 0 - 5/hpf      Yeast: NS (Not Seen)      Sperm: Not Present    ASSESSMENT:      ICD-10 Details  1 GU:   Infected hydrocele - N43.1 Left  2   Left testicular pain - N50.812   3   Epididymo-orchitis - N45.3    PLAN:            Medications New Meds: Bactrim Ds 800 mg-160 mg tablet 1 tablet PO BID   #14  0 Refill(s)    Stop Meds: Levaquin 500 mg tablet 1 tablet PO Daily  Start: 04/22/2019  Stop: 05/02/2019  Discontinue: 04/30/2019  - Reason: The medication cycle was completed.  Hydrocodone-Acetaminophen 5 mg-325 mg tablet 1 tablet PO Q 4 H PRN  Start: 02/23/2019  Discontinue: 04/30/2019  - Reason: The medication cycle was completed.  Tramadol Hcl 50 mg tablet 1 tablet PO Q 6 H PRN  Start: 04/22/2019  Discontinue: 04/30/2019  - Reason: The medication cycle was completed.            Orders Labs Urine Culture          Schedule Return Visit/Planned Activity: ASAP - Schedule Surgery          Document Letter(s):  Created for Patient: Clinical Summary         Notes:   -The risks, benefits and alternatives of LEFT hydrocelectomy was discussed with the patient. Risks include but are not limited bleeding complications, wound infection, chronic testicular pain, recurrence of the hydrocele, testicular loss and MI, CVA, DVT, PE and the inherent risks associated with general anesthesia. He voices understanding and wishes to proceed.

## 2019-05-13 NOTE — Anesthesia Procedure Notes (Signed)
Procedure Name: LMA Insertion Date/Time: 05/13/2019 12:41 PM Performed by: Eben Burow, CRNA Pre-anesthesia Checklist: Patient identified, Emergency Drugs available, Suction available, Patient being monitored and Timeout performed Patient Re-evaluated:Patient Re-evaluated prior to induction Oxygen Delivery Method: Circle system utilized Preoxygenation: Pre-oxygenation with 100% oxygen Induction Type: IV induction Ventilation: Mask ventilation without difficulty LMA: LMA inserted LMA Size: 4.0 Number of attempts: 1 Tube secured with: Tape Dental Injury: Teeth and Oropharynx as per pre-operative assessment

## 2019-05-13 NOTE — Anesthesia Preprocedure Evaluation (Addendum)
Anesthesia Evaluation  Patient identified by MRN, date of birth, ID band Patient awake    Reviewed: Allergy & Precautions, H&P , NPO status , Patient's Chart, lab work & pertinent test results  Airway Mallampati: II       Dental   Pulmonary sleep apnea , COPD,  oxygen dependent, former smoker,    breath sounds clear to auscultation       Cardiovascular hypertension, + CAD and + DVT   Rhythm:regular Rate:Normal  Takes Eliquis   Neuro/Psych PSYCHIATRIC DISORDERS Anxiety    GI/Hepatic GERD  ,  Endo/Other    Renal/GU Renal InsufficiencyRenal disease     Musculoskeletal  (+) Arthritis ,   Abdominal   Peds  Hematology   Anesthesia Other Findings   Reproductive/Obstetrics                             Anesthesia Physical Anesthesia Plan  ASA: III  Anesthesia Plan: General   Post-op Pain Management:    Induction: Intravenous  PONV Risk Score and Plan: 2 and Ondansetron, Dexamethasone and Treatment may vary due to age or medical condition  Airway Management Planned: LMA  Additional Equipment:   Intra-op Plan:   Post-operative Plan: Extubation in OR  Informed Consent: I have reviewed the patients History and Physical, chart, labs and discussed the procedure including the risks, benefits and alternatives for the proposed anesthesia with the patient or authorized representative who has indicated his/her understanding and acceptance.       Plan Discussed with: CRNA, Anesthesiologist and Surgeon  Anesthesia Plan Comments:         Anesthesia Quick Evaluation

## 2019-05-14 ENCOUNTER — Encounter (HOSPITAL_COMMUNITY): Payer: Self-pay | Admitting: Urology

## 2019-05-14 LAB — SURGICAL PATHOLOGY

## 2019-05-14 NOTE — Anesthesia Postprocedure Evaluation (Signed)
Anesthesia Post Note  Patient: Gregory Christensen  Procedure(s) Performed: HYDROCELECTOMY ADULT (Left Scrotum)     Patient location during evaluation: PACU Anesthesia Type: General Level of consciousness: awake and alert Pain management: pain level controlled Vital Signs Assessment: post-procedure vital signs reviewed and stable Respiratory status: spontaneous breathing, nonlabored ventilation, respiratory function stable and patient connected to nasal cannula oxygen Cardiovascular status: blood pressure returned to baseline and stable Postop Assessment: no apparent nausea or vomiting Anesthetic complications: no    Last Vitals:  Vitals:   05/13/19 1434 05/13/19 1445  BP: (!) 159/79 132/76  Pulse: 62 64  Resp: 14   Temp: (!) 36.3 C   SpO2: 100% 99%    Last Pain:  Vitals:   05/13/19 1434  TempSrc:   PainSc: [place]

## 2019-05-15 DIAGNOSIS — N431 Infected hydrocele: Secondary | ICD-10-CM | POA: Diagnosis not present

## 2019-05-15 DIAGNOSIS — N453 Epididymo-orchitis: Secondary | ICD-10-CM | POA: Diagnosis not present

## 2019-05-19 DIAGNOSIS — J449 Chronic obstructive pulmonary disease, unspecified: Secondary | ICD-10-CM | POA: Diagnosis not present

## 2019-05-19 DIAGNOSIS — E7800 Pure hypercholesterolemia, unspecified: Secondary | ICD-10-CM | POA: Diagnosis not present

## 2019-05-19 DIAGNOSIS — J441 Chronic obstructive pulmonary disease with (acute) exacerbation: Secondary | ICD-10-CM | POA: Diagnosis not present

## 2019-05-19 DIAGNOSIS — I2510 Atherosclerotic heart disease of native coronary artery without angina pectoris: Secondary | ICD-10-CM | POA: Diagnosis not present

## 2019-05-19 DIAGNOSIS — I10 Essential (primary) hypertension: Secondary | ICD-10-CM | POA: Diagnosis not present

## 2019-05-19 DIAGNOSIS — M1990 Unspecified osteoarthritis, unspecified site: Secondary | ICD-10-CM | POA: Diagnosis not present

## 2019-05-19 DIAGNOSIS — N400 Enlarged prostate without lower urinary tract symptoms: Secondary | ICD-10-CM | POA: Diagnosis not present

## 2019-06-05 DIAGNOSIS — J441 Chronic obstructive pulmonary disease with (acute) exacerbation: Secondary | ICD-10-CM | POA: Diagnosis not present

## 2019-06-29 DIAGNOSIS — J302 Other seasonal allergic rhinitis: Secondary | ICD-10-CM | POA: Diagnosis not present

## 2019-06-29 DIAGNOSIS — N183 Chronic kidney disease, stage 3 unspecified: Secondary | ICD-10-CM | POA: Diagnosis not present

## 2019-06-29 DIAGNOSIS — R0902 Hypoxemia: Secondary | ICD-10-CM | POA: Diagnosis not present

## 2019-06-29 DIAGNOSIS — K219 Gastro-esophageal reflux disease without esophagitis: Secondary | ICD-10-CM | POA: Diagnosis not present

## 2019-06-29 DIAGNOSIS — Z905 Acquired absence of kidney: Secondary | ICD-10-CM | POA: Diagnosis not present

## 2019-06-29 DIAGNOSIS — J449 Chronic obstructive pulmonary disease, unspecified: Secondary | ICD-10-CM | POA: Diagnosis not present

## 2019-06-29 DIAGNOSIS — Z85528 Personal history of other malignant neoplasm of kidney: Secondary | ICD-10-CM | POA: Diagnosis not present

## 2019-07-06 DIAGNOSIS — J441 Chronic obstructive pulmonary disease with (acute) exacerbation: Secondary | ICD-10-CM | POA: Diagnosis not present

## 2019-07-14 DIAGNOSIS — N400 Enlarged prostate without lower urinary tract symptoms: Secondary | ICD-10-CM | POA: Diagnosis not present

## 2019-07-14 DIAGNOSIS — E7800 Pure hypercholesterolemia, unspecified: Secondary | ICD-10-CM | POA: Diagnosis not present

## 2019-07-14 DIAGNOSIS — I10 Essential (primary) hypertension: Secondary | ICD-10-CM | POA: Diagnosis not present

## 2019-07-14 DIAGNOSIS — I2510 Atherosclerotic heart disease of native coronary artery without angina pectoris: Secondary | ICD-10-CM | POA: Diagnosis not present

## 2019-07-14 DIAGNOSIS — M1990 Unspecified osteoarthritis, unspecified site: Secondary | ICD-10-CM | POA: Diagnosis not present

## 2019-07-14 DIAGNOSIS — J449 Chronic obstructive pulmonary disease, unspecified: Secondary | ICD-10-CM | POA: Diagnosis not present

## 2019-07-17 DIAGNOSIS — R3915 Urgency of urination: Secondary | ICD-10-CM | POA: Diagnosis not present

## 2019-07-17 DIAGNOSIS — N401 Enlarged prostate with lower urinary tract symptoms: Secondary | ICD-10-CM | POA: Diagnosis not present

## 2019-07-17 DIAGNOSIS — C652 Malignant neoplasm of left renal pelvis: Secondary | ICD-10-CM | POA: Diagnosis not present

## 2019-07-23 DIAGNOSIS — N3000 Acute cystitis without hematuria: Secondary | ICD-10-CM | POA: Diagnosis not present

## 2019-07-23 DIAGNOSIS — R3915 Urgency of urination: Secondary | ICD-10-CM | POA: Diagnosis not present

## 2019-07-23 DIAGNOSIS — R350 Frequency of micturition: Secondary | ICD-10-CM | POA: Diagnosis not present

## 2019-08-03 DIAGNOSIS — R972 Elevated prostate specific antigen [PSA]: Secondary | ICD-10-CM | POA: Diagnosis not present

## 2019-08-03 DIAGNOSIS — C652 Malignant neoplasm of left renal pelvis: Secondary | ICD-10-CM | POA: Diagnosis not present

## 2019-08-03 DIAGNOSIS — N3000 Acute cystitis without hematuria: Secondary | ICD-10-CM | POA: Diagnosis not present

## 2019-08-06 DIAGNOSIS — J441 Chronic obstructive pulmonary disease with (acute) exacerbation: Secondary | ICD-10-CM | POA: Diagnosis not present

## 2019-09-03 DIAGNOSIS — J441 Chronic obstructive pulmonary disease with (acute) exacerbation: Secondary | ICD-10-CM | POA: Diagnosis not present

## 2019-09-16 DIAGNOSIS — M1990 Unspecified osteoarthritis, unspecified site: Secondary | ICD-10-CM | POA: Diagnosis not present

## 2019-09-16 DIAGNOSIS — N183 Chronic kidney disease, stage 3 unspecified: Secondary | ICD-10-CM | POA: Diagnosis not present

## 2019-09-16 DIAGNOSIS — I2510 Atherosclerotic heart disease of native coronary artery without angina pectoris: Secondary | ICD-10-CM | POA: Diagnosis not present

## 2019-09-16 DIAGNOSIS — N400 Enlarged prostate without lower urinary tract symptoms: Secondary | ICD-10-CM | POA: Diagnosis not present

## 2019-09-16 DIAGNOSIS — I10 Essential (primary) hypertension: Secondary | ICD-10-CM | POA: Diagnosis not present

## 2019-09-16 DIAGNOSIS — E7800 Pure hypercholesterolemia, unspecified: Secondary | ICD-10-CM | POA: Diagnosis not present

## 2019-09-16 DIAGNOSIS — J449 Chronic obstructive pulmonary disease, unspecified: Secondary | ICD-10-CM | POA: Diagnosis not present

## 2019-09-21 DIAGNOSIS — N453 Epididymo-orchitis: Secondary | ICD-10-CM | POA: Diagnosis not present

## 2019-09-24 ENCOUNTER — Other Ambulatory Visit: Payer: Self-pay

## 2019-09-24 ENCOUNTER — Emergency Department (HOSPITAL_COMMUNITY): Payer: Medicare HMO

## 2019-09-24 ENCOUNTER — Encounter (HOSPITAL_COMMUNITY): Payer: Self-pay | Admitting: Emergency Medicine

## 2019-09-24 ENCOUNTER — Inpatient Hospital Stay (HOSPITAL_COMMUNITY)
Admission: EM | Admit: 2019-09-24 | Discharge: 2019-09-28 | DRG: 378 | Disposition: A | Discharge status: Home / Self Care | Payer: Medicare HMO | Attending: Internal Medicine | Admitting: Internal Medicine

## 2019-09-24 DIAGNOSIS — R011 Cardiac murmur, unspecified: Secondary | ICD-10-CM | POA: Diagnosis present

## 2019-09-24 DIAGNOSIS — K5730 Diverticulosis of large intestine without perforation or abscess without bleeding: Secondary | ICD-10-CM | POA: Diagnosis present

## 2019-09-24 DIAGNOSIS — M1990 Unspecified osteoarthritis, unspecified site: Secondary | ICD-10-CM | POA: Diagnosis present

## 2019-09-24 DIAGNOSIS — G4730 Sleep apnea, unspecified: Secondary | ICD-10-CM | POA: Diagnosis present

## 2019-09-24 DIAGNOSIS — R192 Visible peristalsis: Secondary | ICD-10-CM | POA: Diagnosis present

## 2019-09-24 DIAGNOSIS — F419 Anxiety disorder, unspecified: Secondary | ICD-10-CM | POA: Diagnosis present

## 2019-09-24 DIAGNOSIS — N2889 Other specified disorders of kidney and ureter: Secondary | ICD-10-CM | POA: Diagnosis present

## 2019-09-24 DIAGNOSIS — M25559 Pain in unspecified hip: Secondary | ICD-10-CM | POA: Diagnosis present

## 2019-09-24 DIAGNOSIS — N492 Inflammatory disorders of scrotum: Secondary | ICD-10-CM | POA: Diagnosis not present

## 2019-09-24 DIAGNOSIS — D62 Acute posthemorrhagic anemia: Secondary | ICD-10-CM | POA: Diagnosis not present

## 2019-09-24 DIAGNOSIS — J9611 Chronic respiratory failure with hypoxia: Secondary | ICD-10-CM | POA: Diagnosis not present

## 2019-09-24 DIAGNOSIS — D649 Anemia, unspecified: Secondary | ICD-10-CM | POA: Diagnosis present

## 2019-09-24 DIAGNOSIS — D500 Iron deficiency anemia secondary to blood loss (chronic): Secondary | ICD-10-CM

## 2019-09-24 DIAGNOSIS — N433 Hydrocele, unspecified: Secondary | ICD-10-CM | POA: Diagnosis present

## 2019-09-24 DIAGNOSIS — Z86718 Personal history of other venous thrombosis and embolism: Secondary | ICD-10-CM | POA: Diagnosis not present

## 2019-09-24 DIAGNOSIS — I10 Essential (primary) hypertension: Secondary | ICD-10-CM | POA: Diagnosis not present

## 2019-09-24 DIAGNOSIS — D123 Benign neoplasm of transverse colon: Secondary | ICD-10-CM | POA: Diagnosis not present

## 2019-09-24 DIAGNOSIS — K621 Rectal polyp: Secondary | ICD-10-CM | POA: Diagnosis not present

## 2019-09-24 DIAGNOSIS — Z9981 Dependence on supplemental oxygen: Secondary | ICD-10-CM | POA: Diagnosis not present

## 2019-09-24 DIAGNOSIS — Z87891 Personal history of nicotine dependence: Secondary | ICD-10-CM

## 2019-09-24 DIAGNOSIS — D128 Benign neoplasm of rectum: Secondary | ICD-10-CM | POA: Diagnosis not present

## 2019-09-24 DIAGNOSIS — Z79899 Other long term (current) drug therapy: Secondary | ICD-10-CM

## 2019-09-24 DIAGNOSIS — I700 Atherosclerosis of aorta: Secondary | ICD-10-CM | POA: Diagnosis not present

## 2019-09-24 DIAGNOSIS — I2510 Atherosclerotic heart disease of native coronary artery without angina pectoris: Secondary | ICD-10-CM | POA: Diagnosis present

## 2019-09-24 DIAGNOSIS — C652 Malignant neoplasm of left renal pelvis: Secondary | ICD-10-CM | POA: Diagnosis not present

## 2019-09-24 DIAGNOSIS — N1831 Chronic kidney disease, stage 3a: Secondary | ICD-10-CM | POA: Diagnosis not present

## 2019-09-24 DIAGNOSIS — R109 Unspecified abdominal pain: Secondary | ICD-10-CM | POA: Diagnosis not present

## 2019-09-24 DIAGNOSIS — R1031 Right lower quadrant pain: Secondary | ICD-10-CM

## 2019-09-24 DIAGNOSIS — Z7901 Long term (current) use of anticoagulants: Secondary | ICD-10-CM | POA: Diagnosis not present

## 2019-09-24 DIAGNOSIS — N179 Acute kidney failure, unspecified: Secondary | ICD-10-CM | POA: Diagnosis present

## 2019-09-24 DIAGNOSIS — D124 Benign neoplasm of descending colon: Secondary | ICD-10-CM

## 2019-09-24 DIAGNOSIS — N400 Enlarged prostate without lower urinary tract symptoms: Secondary | ICD-10-CM | POA: Diagnosis present

## 2019-09-24 DIAGNOSIS — K922 Gastrointestinal hemorrhage, unspecified: Secondary | ICD-10-CM | POA: Diagnosis not present

## 2019-09-24 DIAGNOSIS — R1310 Dysphagia, unspecified: Secondary | ICD-10-CM | POA: Diagnosis present

## 2019-09-24 DIAGNOSIS — K219 Gastro-esophageal reflux disease without esophagitis: Secondary | ICD-10-CM | POA: Diagnosis not present

## 2019-09-24 DIAGNOSIS — F1010 Alcohol abuse, uncomplicated: Secondary | ICD-10-CM | POA: Diagnosis present

## 2019-09-24 DIAGNOSIS — M109 Gout, unspecified: Secondary | ICD-10-CM | POA: Diagnosis present

## 2019-09-24 DIAGNOSIS — I129 Hypertensive chronic kidney disease with stage 1 through stage 4 chronic kidney disease, or unspecified chronic kidney disease: Secondary | ICD-10-CM | POA: Diagnosis present

## 2019-09-24 DIAGNOSIS — Z833 Family history of diabetes mellitus: Secondary | ICD-10-CM

## 2019-09-24 DIAGNOSIS — R12 Heartburn: Secondary | ICD-10-CM | POA: Diagnosis present

## 2019-09-24 DIAGNOSIS — I951 Orthostatic hypotension: Secondary | ICD-10-CM | POA: Diagnosis present

## 2019-09-24 DIAGNOSIS — K635 Polyp of colon: Secondary | ICD-10-CM | POA: Diagnosis not present

## 2019-09-24 DIAGNOSIS — G8929 Other chronic pain: Secondary | ICD-10-CM

## 2019-09-24 DIAGNOSIS — R195 Other fecal abnormalities: Secondary | ICD-10-CM | POA: Diagnosis not present

## 2019-09-24 DIAGNOSIS — K921 Melena: Secondary | ICD-10-CM

## 2019-09-24 DIAGNOSIS — Z20822 Contact with and (suspected) exposure to covid-19: Secondary | ICD-10-CM | POA: Diagnosis present

## 2019-09-24 DIAGNOSIS — Z905 Acquired absence of kidney: Secondary | ICD-10-CM

## 2019-09-24 DIAGNOSIS — Z8249 Family history of ischemic heart disease and other diseases of the circulatory system: Secondary | ICD-10-CM

## 2019-09-24 DIAGNOSIS — N183 Chronic kidney disease, stage 3 unspecified: Secondary | ICD-10-CM | POA: Diagnosis not present

## 2019-09-24 DIAGNOSIS — D509 Iron deficiency anemia, unspecified: Secondary | ICD-10-CM | POA: Diagnosis not present

## 2019-09-24 DIAGNOSIS — J449 Chronic obstructive pulmonary disease, unspecified: Secondary | ICD-10-CM | POA: Diagnosis present

## 2019-09-24 DIAGNOSIS — Z86711 Personal history of pulmonary embolism: Secondary | ICD-10-CM

## 2019-09-24 DIAGNOSIS — Z7951 Long term (current) use of inhaled steroids: Secondary | ICD-10-CM

## 2019-09-24 DIAGNOSIS — M549 Dorsalgia, unspecified: Secondary | ICD-10-CM | POA: Diagnosis present

## 2019-09-24 LAB — CBC
HCT: 22.7 % — ABNORMAL LOW (ref 39.0–52.0)
Hemoglobin: 6.2 g/dL — CL (ref 13.0–17.0)
MCH: 20.5 pg — ABNORMAL LOW (ref 26.0–34.0)
MCHC: 27.3 g/dL — ABNORMAL LOW (ref 30.0–36.0)
MCV: 74.9 fL — ABNORMAL LOW (ref 80.0–100.0)
Platelets: 165 10*3/uL (ref 150–400)
RBC: 3.03 MIL/uL — ABNORMAL LOW (ref 4.22–5.81)
RDW: 19.3 % — ABNORMAL HIGH (ref 11.5–15.5)
WBC: 9.3 10*3/uL (ref 4.0–10.5)
nRBC: 0 % (ref 0.0–0.2)

## 2019-09-24 LAB — URINALYSIS, ROUTINE W REFLEX MICROSCOPIC
Bacteria, UA: NONE SEEN
Bilirubin Urine: NEGATIVE
Glucose, UA: NEGATIVE mg/dL
Hgb urine dipstick: NEGATIVE
Ketones, ur: NEGATIVE mg/dL
Nitrite: NEGATIVE
Protein, ur: NEGATIVE mg/dL
Specific Gravity, Urine: 1.014 (ref 1.005–1.030)
pH: 6 (ref 5.0–8.0)

## 2019-09-24 LAB — COMPREHENSIVE METABOLIC PANEL
ALT: 17 U/L (ref 0–44)
AST: 14 U/L — ABNORMAL LOW (ref 15–41)
Albumin: 3.3 g/dL — ABNORMAL LOW (ref 3.5–5.0)
Alkaline Phosphatase: 77 U/L (ref 38–126)
Anion gap: 8 (ref 5–15)
BUN: 20 mg/dL (ref 8–23)
CO2: 22 mmol/L (ref 22–32)
Calcium: 8.6 mg/dL — ABNORMAL LOW (ref 8.9–10.3)
Chloride: 104 mmol/L (ref 98–111)
Creatinine, Ser: 2.02 mg/dL — ABNORMAL HIGH (ref 0.61–1.24)
GFR calc Af Amer: 38 mL/min — ABNORMAL LOW (ref >60)
GFR calc non Af Amer: 33 mL/min — ABNORMAL LOW (ref >60)
Glucose, Bld: 107 mg/dL — ABNORMAL HIGH (ref 70–99)
Potassium: 4.7 mmol/L (ref 3.5–5.1)
Sodium: 134 mmol/L — ABNORMAL LOW (ref 135–145)
Total Bilirubin: 0.3 mg/dL (ref 0.3–1.2)
Total Protein: 7.3 g/dL (ref 6.5–8.1)

## 2019-09-24 LAB — PREPARE RBC (CROSSMATCH)

## 2019-09-24 LAB — SARS CORONAVIRUS 2 (TAT 6-24 HRS): SARS Coronavirus 2: NEGATIVE

## 2019-09-24 LAB — POC OCCULT BLOOD, ED: Fecal Occult Bld: NEGATIVE

## 2019-09-24 MED ORDER — HYDROCODONE-ACETAMINOPHEN 5-325 MG PO TABS
1.0000 | ORAL_TABLET | Freq: Four times a day (QID) | ORAL | Status: DC | PRN
  Administered 2019-09-25 – 2019-09-27 (×2): 1 via ORAL
  Filled 2019-09-24 (×2): qty 1

## 2019-09-24 MED ORDER — PANTOPRAZOLE SODIUM 40 MG PO TBEC: 40.0000 mg | DELAYED_RELEASE_TABLET | Freq: Every day | ORAL | Status: DC

## 2019-09-24 MED ORDER — POLYVINYL ALCOHOL 1.4 % OP SOLN: 1.0000 [drp] | Freq: Three times a day (TID) | OPHTHALMIC | Status: DC | PRN

## 2019-09-24 MED ORDER — LORATADINE 10 MG PO TABS
10.0000 mg | ORAL_TABLET | Freq: Every day | ORAL | Status: DC
  Administered 2019-09-25 – 2019-09-27 (×3): 10 mg via ORAL
  Filled 2019-09-24 (×3): qty 1

## 2019-09-24 MED ORDER — ALBUTEROL SULFATE HFA 108 (90 BASE) MCG/ACT IN AERS
6.0000 | INHALATION_SPRAY | Freq: Once | RESPIRATORY_TRACT | Status: AC
  Administered 2019-09-24: 6 via RESPIRATORY_TRACT
  Filled 2019-09-24: qty 6.7

## 2019-09-24 MED ORDER — TIOTROPIUM BROMIDE MONOHYDRATE 1.25 MCG/ACT IN AERS: 2.0000 | INHALATION_SPRAY | Freq: Every day | RESPIRATORY_TRACT | Status: DC

## 2019-09-24 MED ORDER — TRAMADOL HCL 50 MG PO TABS
50.0000 mg | ORAL_TABLET | Freq: Four times a day (QID) | ORAL | Status: DC | PRN
  Administered 2019-09-24 – 2019-09-25 (×2): 50 mg via ORAL
  Filled 2019-09-24 (×2): qty 1

## 2019-09-24 MED ORDER — SODIUM CHLORIDE 0.9 % IV SOLN: 10.0000 mL/h | Freq: Once | INTRAVENOUS | Status: DC

## 2019-09-24 MED ORDER — MOMETASONE FURO-FORMOTEROL FUM 200-5 MCG/ACT IN AERO
2.0000 | INHALATION_SPRAY | Freq: Two times a day (BID) | RESPIRATORY_TRACT | Status: DC
  Administered 2019-09-24 – 2019-09-27 (×7): 2 via RESPIRATORY_TRACT
  Filled 2019-09-24: qty 8.8

## 2019-09-24 MED ORDER — LACTATED RINGERS IV SOLN: INTRAVENOUS | Status: DC

## 2019-09-24 MED ORDER — UMECLIDINIUM BROMIDE 62.5 MCG/INH IN AEPB
1.0000 | INHALATION_SPRAY | Freq: Every day | RESPIRATORY_TRACT | Status: DC
  Administered 2019-09-24 – 2019-09-27 (×3): 1 via RESPIRATORY_TRACT
  Filled 2019-09-24: qty 7

## 2019-09-24 MED ORDER — ALBUTEROL SULFATE HFA 108 (90 BASE) MCG/ACT IN AERS: 2.0000 | INHALATION_SPRAY | RESPIRATORY_TRACT | Status: DC | PRN

## 2019-09-24 MED ORDER — SODIUM CHLORIDE 0.9 % IV BOLUS
500.0000 mL | Freq: Once | INTRAVENOUS | Status: AC
  Administered 2019-09-24: 500 mL via INTRAVENOUS

## 2019-09-24 MED ORDER — TAMSULOSIN HCL 0.4 MG PO CAPS
0.4000 mg | ORAL_CAPSULE | Freq: Two times a day (BID) | ORAL | Status: DC
  Administered 2019-09-24 – 2019-09-27 (×7): 0.4 mg via ORAL
  Filled 2019-09-24 (×7): qty 1

## 2019-09-24 MED ORDER — FLUTICASONE PROPIONATE 50 MCG/ACT NA SUSP: 1.0000 | Freq: Every day | NASAL | Status: DC | PRN

## 2019-09-24 MED ORDER — PANTOPRAZOLE SODIUM 40 MG IV SOLR
40.0000 mg | Freq: Every day | INTRAVENOUS | Status: DC
  Administered 2019-09-24 – 2019-09-27 (×4): 40 mg via INTRAVENOUS
  Filled 2019-09-24 (×4): qty 40

## 2019-09-24 MED ORDER — ALBUTEROL SULFATE (2.5 MG/3ML) 0.083% IN NEBU
2.5000 mg | INHALATION_SOLUTION | Freq: Four times a day (QID) | RESPIRATORY_TRACT | Status: DC | PRN
  Administered 2019-09-25: 2.5 mg via RESPIRATORY_TRACT

## 2019-09-24 MED ORDER — ONDANSETRON HCL 4 MG/2ML IJ SOLN
4.0000 mg | Freq: Four times a day (QID) | INTRAMUSCULAR | Status: DC | PRN
  Administered 2019-09-25 – 2019-09-27 (×3): 4 mg via INTRAVENOUS
  Filled 2019-09-24 (×3): qty 2

## 2019-09-24 MED ORDER — LEVOFLOXACIN 500 MG PO TABS
500.0000 mg | ORAL_TABLET | Freq: Every day | ORAL | Status: DC
  Administered 2019-09-25 – 2019-09-27 (×3): 500 mg via ORAL
  Filled 2019-09-24 (×3): qty 1

## 2019-09-24 MED ORDER — MONTELUKAST SODIUM 10 MG PO TABS
10.0000 mg | ORAL_TABLET | Freq: Every day | ORAL | Status: DC
  Administered 2019-09-24 – 2019-09-27 (×4): 10 mg via ORAL
  Filled 2019-09-24 (×4): qty 1

## 2019-09-24 NOTE — ED Notes (Signed)
X-ray at bedside

## 2019-09-24 NOTE — Plan of Care (Signed)
  Problem: Education: Goal: Knowledge of General Education information will improve Description Including pain rating scale, medication(s)/side effects and non-pharmacologic comfort measures Outcome: Progressing   

## 2019-09-24 NOTE — ED Triage Notes (Signed)
Pt reports that had lab work done today at [facility]. Was called by his doctor to go to ED due to Hgb 6. Reports having dark stools last week. Reports had kidney removed last year. Pt on 2l of O2 via nasal canula due to COPD and is SOB all the time.

## 2019-09-24 NOTE — H&P (Signed)
History and Physical  Gregory Christensen UKG:[id number] DOB: 1951/06/29 DOA: 09/24/2019  Referring physician:  Rubye Oaks, PA, EDP PCP: Leighton Ruff, MD  Outpatient Specialists: Oncology, urology Patient coming from: Home Chief Complaint: Abnormal labs from PCP at the [facility]  HPI: Gregory Christensen is a 69 y.o. 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  Patient had a colonoscopy done on 09/08/2019 at the [place] does not know the results.  Reports dark stools last week.  His last bowel movement was this morning solid and brown.  Denies any abdominal pain at the time of this exam.  Denies use of NSAIDs.  Intermittent nausea with one episode of vomiting last week.  Associative symptoms include dyspnea with minimal exertion and intermittent dizziness.  In the ED hemoglobin 6.2 with baseline hemoglobin of 10.  GI consulted and [facility] asked to admit.   ED Course: Lab studies remarkable for hemoglobin of 6.2.  FOBT negative.  Elevated creatinine 2.02 with baseline of 1.77.  Review of Systems: Review of systems as noted in the HPI. All other systems reviewed and are negative.   Past Medical History:  Diagnosis Date  . Acute deep vein thrombosis (DVT) of left lower extremity ([place])   . Acute hypoxemic respiratory failure ([place])   . Anxiety   . Aortic atherosclerosis ([place])   . Arthritis   . Benign prostatic hyperplasia 08/28/2018   UNSPECIFIED WHETHER LOWER URINARY TRACT SYMPTOMS PRESENT  . CAD (coronary artery disease)   . Chronic back pain   . Chronic hip pain   . COPD exacerbation ([place]) 10/18/2017  . Emphysema lung ([place])   . Essential hypertension   . GERD (gastroesophageal reflux disease)   . Gout   . Gross hematuria 09/08/2018  . History of tobacco abuse   . Hypoxia   . Pulmonary embolism  ([place]) 05/31/2018  . Renal mass    CONCERNING FOR RENAL CELL CARCINOMA DR. Lovena Neighbours  . Respiratory distress   . Respiratory failure, acute-on-chronic ([place]) 08/03/2018  . Seasonal allergies   . Sleep apnea    Past Surgical History:  Procedure Laterality Date  . CYSTOSCOPY/URETEROSCOPY/HOLMIUM LASER/STENT PLACEMENT Left 10/31/2018   Procedure: CYSTOSCOPY/URETEROSCOPY;  Surgeon: Ceasar Mons, MD;  Location: [facility] ORS;  Service: Urology;  Laterality: Left;  . HERNIA REPAIR  1977  . HYDROCELE EXCISION Left 05/13/2019   Procedure: HYDROCELECTOMY ADULT;  Surgeon: Ceasar Mons, MD;  Location: [facility];  Service: Urology;  Laterality: Left;  Marland Kitchen MANDIBLE SURGERY  1976   EXTENSIVE  . ROBOT ASSITED LAPAROSCOPIC NEPHROURETERECTOMY Left 10/31/2018   Procedure: XI ROBOT ASSITED LAPAROSCOPIC NEPHROURETERECTOMY;  Surgeon: Ceasar Mons, MD;  Location: [facility];  Service: Urology;  Laterality: Left;  ONLY NEEDS 240 MIN FOR ALL PROCEDURES    Social History:  reports that he quit smoking about 23 months ago. His smoking use included cigarettes. He has a 26.00 pack-year smoking history. He has never used smokeless tobacco. He reports previous alcohol use. He reports previous drug use. Drug: Flunitrazepam.   No Known Allergies  Family History  Problem Relation Age of Onset  . Diabetes Mother   . Hypertension Sister   . Hypertension Brother       Prior to Admission medications   Medication Sig Start Date End Date Taking? Authorizing Provider  acetaminophen (TYLENOL) 500 MG tablet Take 1,000 mg by mouth every 6 (six) hours as needed for moderate pain or headache.    Yes [provider]  albuterol (PROVENTIL HFA;VENTOLIN HFA) 108 (90 Base) MCG/ACT inhaler Inhale 2 puffs into the lungs every 2 (two) hours as needed for wheezing or shortness of breath. 08/04/18  Yes Reyne Dumas, MD  albuterol (PROVENTIL) (2.5 MG/3ML) 0.083% nebulizer solution Take 3 mLs (2.5 mg total) by  nebulization every 6 (six) hours as needed for wheezing or shortness of breath. 08/04/18  Yes Reyne Dumas, MD  amLODipine (NORVASC) 2.5 MG tablet Take 2.5 mg by mouth daily.   Yes [provider]  apixaban (ELIQUIS) 5 MG TABS tablet Take 1 tablet (5 mg total) by mouth 2 (two) times daily. 06/10/18  Yes Regalado, Belkys A, MD  budesonide-formoterol (SYMBICORT) 160-4.5 MCG/ACT inhaler Inhale 1 puff into the lungs 2 (two) times daily. 08/04/18  Yes Reyne Dumas, MD  famotidine (PEPCID) 20 MG tablet Take 20 mg by mouth 2 (two) times daily.   Yes [provider]  fluticasone (FLONASE) 50 MCG/ACT nasal spray Place 1 spray into both nostrils 2 (two) times daily. Patient taking differently: Place 1 spray into both nostrils daily as needed for allergies.  08/04/18  Yes Reyne Dumas, MD  gabapentin (NEURONTIN) 400 MG capsule Take 400 mg by mouth 2 (two) times daily.   Yes [provider]  hydroxypropyl methylcellulose / hypromellose (ISOPTO TEARS / GONIOVISC) 2.5 % ophthalmic solution Place 1 drop into both eyes 3 (three) times daily as needed for dry eyes.   Yes [provider]  levofloxacin (LEVAQUIN) 500 MG tablet Take 500 mg by mouth daily. 09/21/19  Yes [provider]  loratadine (CLARITIN) 10 MG tablet Take 10 mg by mouth daily.   Yes [provider]  montelukast (SINGULAIR) 10 MG tablet Take 10 mg by mouth at bedtime.   Yes [provider]  pantoprazole (PROTONIX) 40 MG tablet Take 40 mg by mouth daily.   Yes [provider]  tamsulosin (FLOMAX) 0.4 MG CAPS capsule Take 0.4 mg by mouth every 12 (twelve) hours.    Yes [provider]  Tiotropium Bromide Monohydrate (SPIRIVA RESPIMAT) 1.25 MCG/ACT AERS Inhale 2 puffs into the lungs daily.   Yes [provider]  traMADol (ULTRAM) 50 MG tablet Take 50 mg by mouth every 4 (four) hours as needed for moderate pain.    Yes [provider]  docusate sodium (COLACE)  100 MG capsule Take 1 capsule (100 mg total) by mouth 2 (two) times daily. Patient not taking: Reported on 03/01/2019 10/31/18   Debbrah Alar, PA-C  HYDROcodone-acetaminophen (NORCO/VICODIN) 5-325 MG tablet Take 1 tablet by mouth every 4 (four) hours as needed. Patient not taking: Reported on 09/24/2019 05/13/19   Ceasar Mons, MD  predniSONE (DELTASONE) 5 MG tablet Label  & dispense according to the schedule below. take 8 Pills PO for 3 days, 6 Pills PO for 3 days, 4 Pills PO for 3 days, 2 Pills PO for 3 days, 1 Pills PO for 3 days, 1/2 Pill  PO for 3 days then STOP. Total 65 pills. Patient not taking: Reported on 05/06/2019 03/05/19   Thurnell Lose, MD  sulfamethoxazole-trimethoprim (BACTRIM DS) 800-160 MG tablet Take 1 tablet by mouth 2 (two) times daily. Patient not taking: Reported on 09/24/2019 05/13/19   Ceasar Mons, MD    Physical Exam: BP (!) 148/76   Pulse 94   Temp 98.5  F (36.9 C) (Oral)   Resp 18   SpO2 100%   . General: 68 y.o. year-old male well developed well nourished in no acute distress.  Alert and oriented x3. . Cardiovascular: Regular rate and rhythm with no rubs or gallops.  No thyromegaly or JVD noted.  No lower extremity edema. 2/4 pulses in all 4 extremities. Marland Kitchen Respiratory: Mild rales at bases no wheezing. Good inspiratory effort. . Abdomen: Soft nontender nondistended with normal bowel sounds x4 quadrants. . Muskuloskeletal: No cyanosis, clubbing or edema noted bilaterally . Neuro: CN II-XII intact, strength, sensation, reflexes . Skin: No ulcerative lesions noted or rashes . Psychiatry: Judgement and insight appear normal. Mood is appropriate for condition and setting          Labs on Admission:  Basic Metabolic Panel: Recent Labs  Lab 09/24/19 1525  NA 134*  K 4.7  CL 104  CO2 22  GLUCOSE 107*  BUN 20  CREATININE 2.02*  CALCIUM 8.6*   Liver Function Tests: Recent Labs  Lab 09/24/19 1525  AST 14*  ALT 17  ALKPHOS 77   BILITOT 0.3  PROT 7.3  ALBUMIN 3.3*   No results for input(s): LIPASE, AMYLASE in the last 168 hours. No results for input(s): AMMONIA in the last 168 hours. CBC: Recent Labs  Lab 09/24/19 1525  WBC 9.3  HGB 6.2*  HCT 22.7*  MCV 74.9*  PLT 165   Cardiac Enzymes: No results for input(s): CKTOTAL, CKMB, CKMBINDEX, TROPONINI in the last 168 hours.  BNP (last 3 results) Recent Labs    03/02/19 0245 03/03/19 0330 03/04/19 0450  BNP 33.9 35.9 21.0    ProBNP (last 3 results) No results for input(s): PROBNP in the last 8760 hours.  CBG: No results for input(s): GLUCAP in the last 168 hours.  Radiological Exams on Admission: DG Chest Portable 1 View  Result Date: 09/24/2019 CLINICAL DATA:  Low hemoglobin. Dark stools. Prior history of nephrectomy. COPD. EXAM: PORTABLE CHEST 1 VIEW COMPARISON:  Chest x-ray 04/13/2019 02/28/2019.  CT 09/26/2018. FINDINGS: Mediastinum and hilar structures normal. Heart size stable. Overlying EKG leads and tubing make evaluation difficult. Mild right mid lung field acute infiltrate cannot be excluded. Chronic interstitial changes and changes of COPD noted. No pleural effusion or pneumothorax. IMPRESSION: 1. Mild right mid lung infiltrate cannot be excluded. Overlying EKG leads and tubing make evaluation difficult. Follow-up exams to demonstrate clearing suggested. 2.  Chronic interstitial changes and changes of COPD again noted. Electronically Signed   By: Marcello Moores  Register   On: 09/24/2019 16:31    EKG: I independently viewed the EKG done and my findings are as followed: Sinus rhythm rate of 83.   Assessment/Plan Present on Admission: . Symptomatic anemia  Active Problems:   Symptomatic anemia  Symptomatic anemia in the setting of microcytic chronic anemia As evidenced by drop in hemoglobin 6.2, dyspnea with minimal exertion, dizziness Baseline hemoglobin 10 Was taking Eliquis for history of DVT and PE Transfusing 1 unit PRBC Maintain O2  saturation greater than 90% Repeat H&H posttransfusion MCV 74, obtain iron studies  Presumptive GI bleed Self-reported recent melena which resolved 3-4 days ago Recent colonoscopy at the [place], unaware of results GI following Eliquis on hold due to possible GI procedure  AKI on CKD 3A At baseline creatinine appears to be 1.6 with GFR of 48 Presented with creatinine of 2.02 with GFR of 38 Continue to avoid nephrotoxins, hypotension and dehydration Start gentle IV fluid hydration LR.  History of DVT  and PE Continue to hold Eliquis Defer to GI to restart  Orthostatic hypotension Self reported intermittent dizziness Orthostatic vital signs positive Start gentle IV fluid hydration  Chronic hypoxic respiratory failure on 2 L chronically Dyspneic with minimal exertion Anticoagulated on Eliquis for history of PE Eliquis on hold due to possible GI procedure  Right scrotal incision from hydrocele Follows with urology Continue oral antibiotics    DVT prophylaxis: Eliquis on hold; no SCDs due to history of DVT  Code Status: Full code as stated by the patient himself  Family Communication: We will call family if okay with the patient.  Disposition Plan: Admit to telemetry unit.  Consults called: GI [place]  Admission status: Inpatient status    Kayleen Memos MD [facility] Pager [phone]  If 7PM-7AM, please contact night-coverage [email or web address] Password [facility]  09/24/2019, 5:52 PM

## 2019-09-24 NOTE — ED Provider Notes (Addendum)
[facility] DEPT Provider Note   CSN: [id number] Arrival date & time: 09/24/19  1500    History Chief Complaint  Patient presents with  . Abnormal Lab    hgb 6   Gregory Christensen is a 69 y.o. male with past medical history significant for PE on Eliquis, COPD, chronic back pain, chronic hip pain, renal cell carcinoma presents for evaluation of low hemoglobin.  Was seen by PCP earlier in the week and was called today due to hemoglobin of 6.  Patient admits to some lightheadedness when he goes to sitting to standing.  Recently seen by urology 2 days ago for cellulitis and abscess of his right testes.  He has been on Levaquin.  Patient states his pain, redness and swelling has improved.  He is urinating without difficulty.  Denies any hematuria.  States he did have melanotic stools 4 days last week course not noticed any since.  Denies headache, vision changes, difficulty ambulating, unilateral weakness, chest pain, shortness breath, abdominal pain.  Denies prior transfusions however is agreeable to this if he needs them.  Denies additional aggravating or alleviating factors.  States he does wear chronic oxygen for his COPD.  He uses home rescue inhalers and nebulizer. Denies any chest pain or shortness of breath.  History obtained from patient and past medical records.  No interpreter is used.  [facility] has colonoscopy with biopsy however unsure of results.  Know know hx of varices, ulcers, NSAIDS, alcohol use    HPI     Past Medical History:  Diagnosis Date  . Acute deep vein thrombosis (DVT) of left lower extremity ([place])   . Acute hypoxemic respiratory failure ([place])   . Anxiety   . Aortic atherosclerosis ([place])   . Arthritis   . Benign prostatic hyperplasia 08/28/2018   UNSPECIFIED WHETHER LOWER URINARY TRACT SYMPTOMS PRESENT  . CAD (coronary artery disease)   . Chronic back pain   . Chronic hip pain   . COPD exacerbation  ([place]) 10/18/2017  . Emphysema lung ([place])   . Essential hypertension   . GERD (gastroesophageal reflux disease)   . Gout   . Gross hematuria 09/08/2018  . History of tobacco abuse   . Hypoxia   . Pulmonary embolism ([place]) 05/31/2018  . Renal mass    CONCERNING FOR RENAL CELL CARCINOMA DR. Lovena Neighbours  . Respiratory distress   . Respiratory failure, acute-on-chronic ([place]) 08/03/2018  . Seasonal allergies   . Sleep apnea     Patient Active Problem List   Diagnosis Date Noted  . Symptomatic anemia 09/24/2019  . Pneumonia due to severe acute respiratory syndrome coronavirus 2 (SARS-CoV-2) 02/28/2019  . Renal mass 10/31/2018  . Chest pressure 10/15/2018  . Coronary artery calcification 10/15/2018  . Respiratory failure, acute-on-chronic ([place]) 08/03/2018  . Acute deep vein thrombosis (DVT) of left lower extremity ([place])   . Acute hypoxemic respiratory failure ([place])   . Pulmonary embolism ([place]) 05/31/2018  . COPD exacerbation ([place]) 10/18/2017  . Hypoxia   . Respiratory distress   . Essential hypertension     Past Surgical History:  Procedure Laterality Date  . CYSTOSCOPY/URETEROSCOPY/HOLMIUM LASER/STENT PLACEMENT Left 10/31/2018   Procedure: CYSTOSCOPY/URETEROSCOPY;  Surgeon: Ceasar Mons, MD;  Location: [facility];  Service: Urology;  Laterality: Left;  . HERNIA REPAIR  1977  . HYDROCELE EXCISION Left 05/13/2019   Procedure: HYDROCELECTOMY ADULT;  Surgeon: Ceasar Mons, MD;  Location: [facility];  Service: Urology;  Laterality: Left;  Marland Kitchen MANDIBLE SURGERY  1976   EXTENSIVE  . ROBOT ASSITED LAPAROSCOPIC NEPHROURETERECTOMY Left 10/31/2018   Procedure: XI ROBOT ASSITED LAPAROSCOPIC NEPHROURETERECTOMY;  Surgeon: Ceasar Mons, MD;  Location: [facility];  Service: Urology;  Laterality: Left;  ONLY NEEDS 240 MIN FOR ALL PROCEDURES       Family History  Problem Relation Age of Onset  . Diabetes Mother   . Hypertension Sister   . Hypertension Brother     Social  History   Tobacco Use  . Smoking status: Former Smoker    Packs/day: 1.00    Years: 26.00    Pack years: 26.00    Types: Cigarettes    Quit date: 10/18/2017    Years since quitting: 1.9  . Smokeless tobacco: Never Used  Substance Use Topics  . Alcohol use: Not Currently    Comment: 2 beers a day   . Drug use: Not Currently    Types: Flunitrazepam    Home Medications Prior to Admission medications   Medication Sig Start Date End Date Taking? Authorizing Provider  acetaminophen (TYLENOL) 500 MG tablet Take 1,000 mg by mouth every 6 (six) hours as needed for moderate pain or headache.    Yes [provider]  albuterol (PROVENTIL HFA;VENTOLIN HFA) 108 (90 Base) MCG/ACT inhaler Inhale 2 puffs into the lungs every 2 (two) hours as needed for wheezing or shortness of breath. 08/04/18  Yes Reyne Dumas, MD  albuterol (PROVENTIL) (2.5 MG/3ML) 0.083% nebulizer solution Take 3 mLs (2.5 mg total) by nebulization every 6 (six) hours as needed for wheezing or shortness of breath. 08/04/18  Yes Reyne Dumas, MD  amLODipine (NORVASC) 2.5 MG tablet Take 2.5 mg by mouth daily.   Yes [provider]  apixaban (ELIQUIS) 5 MG TABS tablet Take 1 tablet (5 mg total) by mouth 2 (two) times daily. 06/10/18  Yes Regalado, Belkys A, MD  budesonide-formoterol (SYMBICORT) 160-4.5 MCG/ACT inhaler Inhale 1 puff into the lungs 2 (two) times daily. 08/04/18  Yes Reyne Dumas, MD  famotidine (PEPCID) 20 MG tablet Take 20 mg by mouth 2 (two) times daily.   Yes [provider]  fluticasone (FLONASE) 50 MCG/ACT nasal spray Place 1 spray into both nostrils 2 (two) times daily. Patient taking differently: Place 1 spray into both nostrils daily as needed for allergies.  08/04/18  Yes Reyne Dumas, MD  gabapentin (NEURONTIN) 400 MG capsule Take 400 mg by mouth 2 (two) times daily.   Yes [provider]  hydroxypropyl methylcellulose / hypromellose (ISOPTO TEARS / GONIOVISC) 2.5 % ophthalmic  solution Place 1 drop into both eyes 3 (three) times daily as needed for dry eyes.   Yes [provider]  levofloxacin (LEVAQUIN) 500 MG tablet Take 500 mg by mouth daily. 09/21/19  Yes [provider]  loratadine (CLARITIN) 10 MG tablet Take 10 mg by mouth daily.   Yes [provider]  montelukast (SINGULAIR) 10 MG tablet Take 10 mg by mouth at bedtime.   Yes [provider]  pantoprazole (PROTONIX) 40 MG tablet Take 40 mg by mouth daily.   Yes [provider]  tamsulosin (FLOMAX) 0.4 MG CAPS capsule Take 0.4 mg by mouth every 12 (twelve) hours.    Yes [provider]  Tiotropium Bromide Monohydrate (SPIRIVA RESPIMAT) 1.25 MCG/ACT AERS Inhale 2 puffs into the lungs daily.   Yes [provider]  traMADol (ULTRAM) 50 MG tablet Take 50 mg by mouth every 4 (four) hours as needed for  moderate pain.    Yes [provider]  docusate sodium (COLACE) 100 MG capsule Take 1 capsule (100 mg total) by mouth 2 (two) times daily. Patient not taking: Reported on 03/01/2019 10/31/18   Debbrah Alar, PA-C  HYDROcodone-acetaminophen (NORCO/VICODIN) 5-325 MG tablet Take 1 tablet by mouth every 4 (four) hours as needed. Patient not taking: Reported on 09/24/2019 05/13/19   Ceasar Mons, MD  predniSONE (DELTASONE) 5 MG tablet Label  & dispense according to the schedule below. take 8 Pills PO for 3 days, 6 Pills PO for 3 days, 4 Pills PO for 3 days, 2 Pills PO for 3 days, 1 Pills PO for 3 days, 1/2 Pill  PO for 3 days then STOP. Total 65 pills. Patient not taking: Reported on 05/06/2019 03/05/19   Thurnell Lose, MD  sulfamethoxazole-trimethoprim (BACTRIM DS) 800-160 MG tablet Take 1 tablet by mouth 2 (two) times daily. Patient not taking: Reported on 09/24/2019 05/13/19   Ceasar Mons, MD    Allergies    Patient has no known allergies.  Review of Systems   Review of Systems  Constitutional: Negative.   HENT: Negative.    Respiratory: Negative.   Cardiovascular: Negative.   Gastrointestinal: Negative.   Genitourinary: Positive for scrotal swelling and testicular pain. Negative for decreased urine volume, difficulty urinating, discharge, dysuria, flank pain, frequency, genital sores, hematuria, penile pain, penile swelling and urgency.  Musculoskeletal: Negative.   Skin: Negative.   Neurological: Positive for light-headedness. Negative for dizziness, tremors, seizures, syncope, facial asymmetry, speech difficulty, weakness, numbness and headaches.  All other systems reviewed and are negative.   Physical Exam Updated Vital Signs BP (!) 141/69   Pulse 79   Temp 98.5 F (36.9 C) (Oral)   Resp 14   SpO2 100%   Physical Exam Vitals and nursing note reviewed. Exam conducted with a chaperone present.  Constitutional:      General: He is not in acute distress.    Appearance: He is not ill-appearing, toxic-appearing or diaphoretic.  HENT:     Head: Normocephalic and atraumatic.     Jaw: There is normal jaw occlusion.     Mouth/Throat:     Comments: Posterior oropharynx clear.   Neck:     Trachea: Phonation normal.     Comments: No Neck stiffness or neck rigidity.   Cardiovascular:     Pulses: Normal pulses.     Heart sounds: Normal heart sounds.     Comments: No murmurs rubs or gallops. Pulmonary:     Effort: Pulmonary effort is normal.     Breath sounds: No decreased air movement. Wheezing present.     Comments: Diffuse expiratory wheeze however able to speak in full sentences without difficulty.  No tachypnea or respiratory distress. Abdominal:     Comments: Soft, nontender without rebound or guarding.  No CVA tenderness.  Genitourinary:    Rectum: No mass, tenderness or anal fissure.     Comments: Mild tenderness with some swelling to his right testes.  Some mild alteration.  No overlying erythema.  No tenderness at bilateral epididymis.  Rectal exam with light brown stool in vault.  No  tenderness. Musculoskeletal:     Comments: Moves all 4 extremities without difficulty.  Lower extremities without edema, erythema or warmth.  Skin:    Comments: Brisk capillary refill.  No rashes or lesions.  Neurological:     Mental Status: He is alert.     Comments: Ambulatory in department without difficulty.  Cranial nerves II through XII grossly intact.  No facial droop.  No aphasia.    ED Results / Procedures / Treatments   Labs (all labs ordered are listed, but only abnormal results are displayed) Labs Reviewed  COMPREHENSIVE METABOLIC PANEL - Abnormal; Notable for the following components:      Result Value   Sodium 134 (*)    Glucose, Bld 107 (*)    Creatinine, Ser 2.02 (*)    Calcium 8.6 (*)    Albumin 3.3 (*)    AST 14 (*)    GFR calc non Af Amer 33 (*)    GFR calc Af Amer 38 (*)    All other components within normal limits  CBC - Abnormal; Notable for the following components:   RBC 3.03 (*)    Hemoglobin 6.2 (*)    HCT 22.7 (*)    MCV 74.9 (*)    MCH 20.5 (*)    MCHC 27.3 (*)    RDW 19.3 (*)    All other components within normal limits  URINALYSIS, ROUTINE W REFLEX MICROSCOPIC - Abnormal; Notable for the following components:   Leukocytes,Ua LARGE (*)    All other components within normal limits  SARS CORONAVIRUS 2 (TAT 6-24 HRS)  POC OCCULT BLOOD, ED  TYPE AND SCREEN  PREPARE RBC (CROSSMATCH)    EKG None  Radiology DG Chest Portable 1 View  Result Date: 09/24/2019 CLINICAL DATA:  Low hemoglobin. Dark stools. Prior history of nephrectomy. COPD. EXAM: PORTABLE CHEST 1 VIEW COMPARISON:  Chest x-ray 04/13/2019 02/28/2019.  CT 09/26/2018. FINDINGS: Mediastinum and hilar structures normal. Heart size stable. Overlying EKG leads and tubing make evaluation difficult. Mild right mid lung field acute infiltrate cannot be excluded. Chronic interstitial changes and changes of COPD noted. No pleural effusion or pneumothorax. IMPRESSION: 1. Mild right mid lung  infiltrate cannot be excluded. Overlying EKG leads and tubing make evaluation difficult. Follow-up exams to demonstrate clearing suggested. 2.  Chronic interstitial changes and changes of COPD again noted. Electronically Signed   By: Marcello Moores  Register   On: 09/24/2019 16:31    Procedures Procedures (including critical care time)       CRITICAL CARE Performed by: Silas Flood Lacrecia Delval Total critical care time: 35 minutes Critical care time was exclusive of separately billable procedures and treating other patients. Critical care was necessary to treat or prevent imminent or life-threatening deterioration. Critical care was time spent personally by me on the following activities: development of treatment plan with patient and/or surrogate as well as nursing, discussions with consultants, evaluation of patient's response to treatment, examination of patient, obtaining history from patient or surrogate, ordering and performing treatments and interventions, ordering and review of laboratory studies, ordering and review of radiographic studies, pulse oximetry and re-evaluation of patient's condition.      Medications Ordered in ED Medications  0.9 %  sodium chloride infusion (0 mL/hr Intravenous Hold 09/24/19 1618)  albuterol (VENTOLIN HFA) 108 (90 Base) MCG/ACT inhaler 6 puff (6 puffs Inhalation Given 09/24/19 1614)  sodium chloride 0.9 % bolus 500 mL (500 mLs Intravenous New Bag/Given 09/24/19 1618)    ED Course  I have reviewed the triage vital signs and the nursing notes.  Pertinent labs & imaging results that were available during my care of the patient were reviewed by me and considered in my medical decision making (see chart for details).  69 year old male presents for evaluation of anemia found outpatient PCP.  Had melanotic stools last week however denies  any.  Afebrile, nonseptic.  Abdomen soft, nontender.  Recently started antibiotics 2 days ago, Levaquin for scrotal abscess, followed  by Dr. Gilford Rile [facility].  He does not appear septic.  He is afebrile without tachycardia, tachypnea.  Stable blood pressure.  Does admit to some lightheadedness when he goes from sitting to standing.  He has a nonfocal neurologic exam without deficits.  Denies any chest pain or shortness of breath.  He is on chronic oxygen from COPD.  He does have some diffuse expiratory wheeze however does not appear in acute respiratory distress.  Will give some albuterol.  GU exam with light brown stool in vault.  Plan on labs reassess  Labs and imaging personally reviewed and interpreted: Occult negative CBC with hgb at 6.2 Metabolic panel with mild hyperglycemia 27, creatinine 2.02 up from baseline 1.77 DG chest with possible infiltrate.  Patient denies cough or increased shortness of breath.  He is currently on Levaquin for a scrotal abscess do not think we need to add additional antibiotics at this time. EKG without STEMI UA pending however no UTI complaints COVID pending  Reassessed.  Discussed plan.  Lung sounds with improvement.  States breathing is at baseline.  He has been ambulatory without difficulty. Discussed critically low Hgb. Discussed transfusion risk vs benefit. He is agreeable to transfusion and admission to hospital.  CONSULT with GI Carl Best PA-C with [facility]. Will consult on patient. Recommends hold anticoagulation until determine possible scope. Recommend hospitalist admit.  CONSULT with Dr. [place] Crane with [facility] who will evaluate patient for admission.  Patient seen eval by attending physician, Dr. Roslynn Amble who agrees with above treatment, plan and disposition.  The patient appears reasonably stabilized for admission considering the current resources, flow, and capabilities available in the ED at this time, and I doubt any other [facility] requiring further screening and/or treatment in the ED prior to admission.    MDM Rules/Calculators/A&P                       Final  Clinical Impression(s) / ED Diagnoses Final diagnoses:  Symptomatic anemia  AKI (acute kidney injury) ([place])  Scrotal abscess    Rx / DC Orders ED Discharge Orders    None       Dolorez Jeffrey A, PA-C 09/24/19 1649    Pamlea Finder A, PA-C 09/24/19 1659    Lucrezia Starch, MD 09/24/19 1714

## 2019-09-24 NOTE — Consult Note (Addendum)
Referring Provider:  Primary Care Physician:  Leighton Ruff, MD Primary Gastroenterologist:    Reason for Consultation:  Anemia, melena   HPI: Gregory Christensen is a 69 y.o. 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 Ellison Hughs. He was seen by his PCP earlier today with complaints of feeling slightly dizzy. He reported passing black stool last week so he was sent to [facility] ED for for further evaluation.  His labs in the ED showed a Hemoglobin 6.2 (down from 10.2 Nov. 2020).  Hematocrit 22.7.  MCV 74.9.  BUN 20.  Creatinine 2.02 (baseline Cr. 1.77).  Rectal exam done by the ED PA-C showed normal brown stool which was guaiac negative.  Two units of packed red blood cells have been ordered.   He reports having heartburn for the past month which occurs daily.  No specific food or stress triggers.  He is having difficulty swallowing pills which becomes stuck to his mid esophagus.  He drinks water and the stuck pills passed down.  He denies having any upper or lower abdominal pain.  Last week he started passing black stools.  On Wednesday 3/17 he passed 2 solid black stools, on 3/18 he passed to solid black stools and on Friday 3/19 he passed a large volume of loose black stool x2.  The next day he was back to passing normal brown formed stool.  He previously had 1 or 2 days of solid black stool November 2020.  Reports taking Pepto-Bismol 1 capful twice daily for the past month due to having increased reflux symptoms.  He is on Eliquis twice daily secondary to a history of a DVT and PE in 2019.  His last dose of Eliquis was at 7 AM today.  He denies taking any aspirin or other NSAIDs.  He underwent a colonoscopy by his GI at the [facility] hospital on 09/08/2019.  He  reported having colon biopsies done which were normal, he denied having colon polyps removed.  His appetite is good.  He denies having any weight loss.  History of alcohol abuse.  He previously drank 1 case of beer daily for 15 years then significantly reduced his alcohol intake 2 years ago to 6 beers monthly.  No fever, sweats or chills.  No rectal bleeding.  No family history of esophageal, gastric or colorectal cancer.   ED course: Sodium 134.  Potassium 4.7.  BUN 20.  Creatinine 2.02.  Alk phos 77.  Albumin 3.3.  AST 14.  ALT 17.  Total bili 0.3.  WBC 9.3.  Hemoglobin 6.2.  (base line Hg 10.2 on 05/11/2019). Hematocrit 27.2.  MCV 74.9.  Platelet 165.  Chest x-ray: 1. Mild right mid lung infiltrate cannot be excluded. Overlying EKG leads and tubing make evaluation difficult. Follow-up exams to demonstrate clearing suggested. 2.  Chronic interstitial changes and changes of COPD again noted.    Echo 06/01/2018: LV EF 55 to 60%  Past Medical History:  Diagnosis Date  . Acute deep vein thrombosis (DVT) of left lower extremity ([place])   . Acute hypoxemic respiratory failure ([place])   . Anxiety   . Aortic atherosclerosis ([place])   . Arthritis   . Benign prostatic hyperplasia 08/28/2018   UNSPECIFIED WHETHER LOWER URINARY TRACT SYMPTOMS PRESENT  . CAD (coronary artery disease)   . Chronic back  pain   . Chronic hip pain   . COPD exacerbation ([place]) 10/18/2017  . Emphysema lung ([place])   . Essential hypertension   . GERD (gastroesophageal reflux disease)   . Gout   . Gross hematuria 09/08/2018  . History of tobacco abuse   . Hypoxia   . Pulmonary embolism ([place]) 05/31/2018  . Renal mass    CONCERNING FOR RENAL CELL CARCINOMA DR. Lovena Neighbours  . Respiratory distress   . Respiratory failure, acute-on-chronic ([place]) 08/03/2018  . Seasonal allergies   . Sleep apnea     Past Surgical History:  Procedure Laterality Date  . CYSTOSCOPY/URETEROSCOPY/HOLMIUM LASER/STENT PLACEMENT Left 10/31/2018    Procedure: CYSTOSCOPY/URETEROSCOPY;  Surgeon: Ceasar Mons, MD;  Location: [facility] ORS;  Service: Urology;  Laterality: Left;  . HERNIA REPAIR  1977  . HYDROCELE EXCISION Left 05/13/2019   Procedure: HYDROCELECTOMY ADULT;  Surgeon: Ceasar Mons, MD;  Location: [facility];  Service: Urology;  Laterality: Left;  Marland Kitchen MANDIBLE SURGERY  1976   EXTENSIVE  . ROBOT ASSITED LAPAROSCOPIC NEPHROURETERECTOMY Left 10/31/2018   Procedure: XI ROBOT ASSITED LAPAROSCOPIC NEPHROURETERECTOMY;  Surgeon: Ceasar Mons, MD;  Location: [facility];  Service: Urology;  Laterality: Left;  ONLY NEEDS 240 MIN FOR ALL PROCEDURES    Prior to Admission medications   Medication Sig Start Date End Date Taking? Authorizing Provider  acetaminophen (TYLENOL) 500 MG tablet Take 1,000 mg by mouth every 6 (six) hours as needed for moderate pain or headache.    Yes [provider]  albuterol (PROVENTIL HFA;VENTOLIN HFA) 108 (90 Base) MCG/ACT inhaler Inhale 2 puffs into the lungs every 2 (two) hours as needed for wheezing or shortness of breath. 08/04/18  Yes Reyne Dumas, MD  albuterol (PROVENTIL) (2.5 MG/3ML) 0.083% nebulizer solution Take 3 mLs (2.5 mg total) by nebulization every 6 (six) hours as needed for wheezing or shortness of breath. 08/04/18  Yes Reyne Dumas, MD  amLODipine (NORVASC) 2.5 MG tablet Take 2.5 mg by mouth daily.   Yes [provider]  apixaban (ELIQUIS) 5 MG TABS tablet Take 1 tablet (5 mg total) by mouth 2 (two) times daily. 06/10/18  Yes Regalado, Belkys A, MD  budesonide-formoterol (SYMBICORT) 160-4.5 MCG/ACT inhaler Inhale 1 puff into the lungs 2 (two) times daily. 08/04/18  Yes Reyne Dumas, MD  famotidine (PEPCID) 20 MG tablet Take 20 mg by mouth 2 (two) times daily.   Yes [provider]  fluticasone (FLONASE) 50 MCG/ACT nasal spray Place 1 spray into both nostrils 2 (two) times daily. Patient taking differently: Place 1 spray into both nostrils daily as needed  for allergies.  08/04/18  Yes Reyne Dumas, MD  gabapentin (NEURONTIN) 400 MG capsule Take 400 mg by mouth 2 (two) times daily.   Yes [provider]  hydroxypropyl methylcellulose / hypromellose (ISOPTO TEARS / GONIOVISC) 2.5 % ophthalmic solution Place 1 drop into both eyes 3 (three) times daily as needed for dry eyes.   Yes [provider]  levofloxacin (LEVAQUIN) 500 MG tablet Take 500 mg by mouth daily. 09/21/19  Yes [provider]  loratadine (CLARITIN) 10 MG tablet Take 10 mg by mouth daily.   Yes [provider]  montelukast (SINGULAIR) 10 MG tablet Take 10 mg by mouth at bedtime.   Yes [provider]  pantoprazole (PROTONIX) 40 MG tablet Take 40 mg by mouth daily.   Yes [provider]  tamsulosin (FLOMAX) 0.4 MG CAPS capsule Take 0.4 mg by mouth every 12 (twelve) hours.  Yes [provider]  Tiotropium Bromide Monohydrate (SPIRIVA RESPIMAT) 1.25 MCG/ACT AERS Inhale 2 puffs into the lungs daily.   Yes [provider]  traMADol (ULTRAM) 50 MG tablet Take 50 mg by mouth every 4 (four) hours as needed for moderate pain.    Yes [provider]  docusate sodium (COLACE) 100 MG capsule Take 1 capsule (100 mg total) by mouth 2 (two) times daily. Patient not taking: Reported on 03/01/2019 10/31/18   Debbrah Alar, PA-C  HYDROcodone-acetaminophen (NORCO/VICODIN) 5-325 MG tablet Take 1 tablet by mouth every 4 (four) hours as needed. Patient not taking: Reported on 09/24/2019 05/13/19   Ceasar Mons, MD  predniSONE (DELTASONE) 5 MG tablet Label  & dispense according to the schedule below. take 8 Pills PO for 3 days, 6 Pills PO for 3 days, 4 Pills PO for 3 days, 2 Pills PO for 3 days, 1 Pills PO for 3 days, 1/2 Pill  PO for 3 days then STOP. Total 65 pills. Patient not taking: Reported on 05/06/2019 03/05/19   Thurnell Lose, MD  sulfamethoxazole-trimethoprim (BACTRIM DS) 800-160 MG tablet Take 1 tablet by mouth  2 (two) times daily. Patient not taking: Reported on 09/24/2019 05/13/19   Ceasar Mons, MD    Current Facility-Administered Medications  Medication Dose Route Frequency Provider Last Rate Last Admin  . 0.9 %  sodium chloride infusion  10 mL/hr Intravenous Once Henderly, Britni A, PA-C       Current Outpatient Medications  Medication Sig Dispense Refill  . acetaminophen (TYLENOL) 500 MG tablet Take 1,000 mg by mouth every 6 (six) hours as needed for moderate pain or headache.     . albuterol (PROVENTIL HFA;VENTOLIN HFA) 108 (90 Base) MCG/ACT inhaler Inhale 2 puffs into the lungs every 2 (two) hours as needed for wheezing or shortness of breath. 1 Inhaler 2  . albuterol (PROVENTIL) (2.5 MG/3ML) 0.083% nebulizer solution Take 3 mLs (2.5 mg total) by nebulization every 6 (six) hours as needed for wheezing or shortness of breath. 75 mL 12  . amLODipine (NORVASC) 2.5 MG tablet Take 2.5 mg by mouth daily.    Marland Kitchen apixaban (ELIQUIS) 5 MG TABS tablet Take 1 tablet (5 mg total) by mouth 2 (two) times daily. 60 tablet 1  . budesonide-formoterol (SYMBICORT) 160-4.5 MCG/ACT inhaler Inhale 1 puff into the lungs 2 (two) times daily. 1 Inhaler 12  . famotidine (PEPCID) 20 MG tablet Take 20 mg by mouth 2 (two) times daily.    . fluticasone (FLONASE) 50 MCG/ACT nasal spray Place 1 spray into both nostrils 2 (two) times daily. (Patient taking differently: Place 1 spray into both nostrils daily as needed for allergies. ) 16 g 2  . gabapentin (NEURONTIN) 400 MG capsule Take 400 mg by mouth 2 (two) times daily.    . hydroxypropyl methylcellulose / hypromellose (ISOPTO TEARS / GONIOVISC) 2.5 % ophthalmic solution Place 1 drop into both eyes 3 (three) times daily as needed for dry eyes.    Marland Kitchen levofloxacin (LEVAQUIN) 500 MG tablet Take 500 mg by mouth daily.    Marland Kitchen loratadine (CLARITIN) 10 MG tablet Take 10 mg by mouth daily.    . montelukast (SINGULAIR) 10 MG tablet Take 10 mg by mouth at bedtime.    .  pantoprazole (PROTONIX) 40 MG tablet Take 40 mg by mouth daily.    . tamsulosin (FLOMAX) 0.4 MG CAPS capsule Take 0.4 mg by mouth every 12 (twelve) hours.     . Tiotropium Bromide Monohydrate (  SPIRIVA RESPIMAT) 1.25 MCG/ACT AERS Inhale 2 puffs into the lungs daily.    . traMADol (ULTRAM) 50 MG tablet Take 50 mg by mouth every 4 (four) hours as needed for moderate pain.     Marland Kitchen docusate sodium (COLACE) 100 MG capsule Take 1 capsule (100 mg total) by mouth 2 (two) times daily. (Patient not taking: Reported on 03/01/2019)    . HYDROcodone-acetaminophen (NORCO/VICODIN) 5-325 MG tablet Take 1 tablet by mouth every 4 (four) hours as needed. (Patient not taking: Reported on 09/24/2019) 15 tablet 0  . predniSONE (DELTASONE) 5 MG tablet Label  & dispense according to the schedule below. take 8 Pills PO for 3 days, 6 Pills PO for 3 days, 4 Pills PO for 3 days, 2 Pills PO for 3 days, 1 Pills PO for 3 days, 1/2 Pill  PO for 3 days then STOP. Total 65 pills. (Patient not taking: Reported on 05/06/2019) 65 tablet 0  . sulfamethoxazole-trimethoprim (BACTRIM DS) 800-160 MG tablet Take 1 tablet by mouth 2 (two) times daily. (Patient not taking: Reported on 09/24/2019) 10 tablet 0    Allergies as of 09/24/2019  . (No Known Allergies)    Family History  Problem Relation Age of Onset  . Diabetes Mother   . Hypertension Sister   . Hypertension Brother     Social History   Socioeconomic History  . Marital status: Divorced    Spouse name: Not on file  . Number of children: Not on file  . Years of education: Not on file  . Highest education level: Not on file  Occupational History  . Not on file  Tobacco Use  . Smoking status: Former Smoker    Packs/day: 1.00    Years: 26.00    Pack years: 26.00    Types: Cigarettes    Quit date: 10/18/2017    Years since quitting: 1.9  . Smokeless tobacco: Never Used  Substance and Sexual Activity  . Alcohol use: Not Currently    Comment: 2 beers a day   . Drug use: Not  Currently    Types: Flunitrazepam  . Sexual activity: Not on file  Other Topics Concern  . Not on file  Social History Narrative  . Not on file   Social Determinants of Health   Financial Resource Strain:   . Difficulty of Paying Living Expenses:   Food Insecurity:   . Worried About Charity fundraiser in the Last Year:   . Arboriculturist in the Last Year:   Transportation Needs:   . Film/video editor (Medical):   Marland Kitchen Lack of Transportation (Non-Medical):   Physical Activity:   . Days of Exercise per Week:   . Minutes of Exercise per Session:   Stress:   . Feeling of Stress :   Social Connections:   . Frequency of Communication with Friends and Family:   . Frequency of Social Gatherings with Friends and Family:   . Attends Religious Services:   . Active Member of Clubs or Organizations:   . Attends Archivist Meetings:   Marland Kitchen Marital Status:   Intimate Partner Violence:   . Fear of Current or Ex-Partner:   . Emotionally Abused:   Marland Kitchen Physically Abused:   . Sexually Abused:     Review of Systems: See HPI, all other systems reviewed and are negative  Physical Exam: Vital signs in last 24 hours: Temp:  [98.5 F (36.9 C)] 98.5 F (36.9 C) (03/25 1515) Pulse Rate:  [  84-88] 88 (03/25 1530) Resp:  [16-17] 17 (03/25 1530) BP: (147-150)/(73-80) 150/80 (03/25 1530) SpO2:  [100 %] 100 % (03/25 1530)   General:  Alert 69 year old male in no acute distress. Head:  Normocephalic and atraumatic. Eyes:  No scleral icterus. Conjunctiva pink. Ears:  Normal auditory acuity. Nose:  No deformity, discharge or lesions. Mouth: Absent upper dentition.  No ulcers or lesions.  Neck:  Supple. No lymphadenopathy or thyromegaly.  Lungs: Tight expiratory wheezes throughout, diminished breath sounds throughout. Heart: Regular rate and rhythm, no murmurs. Abdomen: Protuberant, soft with right upper quadrant tenderness without rebound or guarding.  Hypoactive bowel sounds to all 4  quadrants.  Obvious HSM. Rectal: Ductal exam per the ED PA-C identified brown stool guaiac negative. Msk:  Symmetrical without gross deformities.  Pulses:  Normal pulses noted. Extremities:  Without clubbing or edema. Neurologic:  Alert and  oriented x4. No focal deficits.  Skin:  Intact without significant lesions or rashes. Psych:  Alert and cooperative. Normal mood and affect.  Intake/Output from previous day: No intake/output data recorded. Intake/Output this shift: No intake/output data recorded.  Lab Results: Recent Labs    09/24/19 1525  WBC 9.3  HGB 6.2*  HCT 22.7*  PLT 165   BMET Recent Labs    09/24/19 1525  NA 134*  K 4.7  CL 104  CO2 22  GLUCOSE 107*  BUN 20  CREATININE 2.02*  CALCIUM 8.6*   LFT Recent Labs    09/24/19 1525  PROT 7.3  ALBUMIN 3.3*  AST 14*  ALT 17  ALKPHOS 77  BILITOT 0.3   PT/INR No results for input(s): LABPROT, INR in the last 72 hours. Hepatitis Panel No results for input(s): HEPBSAG, HCVAB, HEPAIGM, HEPBIGM in the last 72 hours.    Studies/Results: No results found.  IMPRESSION/PLAN:  81.  69 year old male presents to the ED with dizziness and melenic stool with profound anemia in the setting of active reflux symptoms with pill dysphagia taking Pepto-Bismol twice daily for the past month.  Hemoglobin 6.2 down from his baseline of 10.2.  Stool guaiac negative. -Agree with 2 unit PRBCs transfusion -Hold Eliquis for now -Clear liquid diet okay for now -EGD in 1 or 2 days after Eliquis washout -Check H&H every 6 Hours x 4 -Pantoprazole 40 mg IV every 24 hours for now -CBC, iron panel in a.m. -I will request copy of his 09/08/2019 colonoscopy from [facility]  2.  History of DVT/PE 2019 on Eliquis. -Hold Eliquis for now  3.  Acute on chronic microcytic anemia -See plan in #1  4. 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  COPD -Management per  the hospitalist  Further recommendations per Dr. Verta Ellen Dorathy Daft  09/24/2019, 4:18 PM  GI ATTENDING  History, laboratories, x-rays reviewed.  Patient seen and examined.  Agree with comprehensive consultation note as outlined above.  Patient presents to the hospital with complaints of dark stools.  This is secondary to Pepto-Bismol.  Stools are Hemoccult negative.  He is however found to have significant iron deficiency anemia.  Hemodynamically stable.  Has been on chronic Eliquis therapy.  Now on hold.  Recent colonoscopy results unknown.  Question AVMs or other mucosal pathology to explain his iron deficiency anemia.  Recommend the following: 1.  PPI 2.  Hold Eliquis.  2 to 3-day washout with renal insufficiency 3.  Needs iron replacement therapy 4.  Obtain colonoscopy report  for review soon as possible 5.  Probable upper endoscopy before discharge.  Patient is high risk given his comorbidities and chronic anticoagulation state.  Time to be determined 6.  Transfuse to appropriate hemoglobin level  We will follow.  Docia Chuck. Geri Seminole., M.D. [facility] Division of Gastroenterology

## 2019-09-24 NOTE — Progress Notes (Signed)
Received pt from the ED, in stable condition. Pt for blood transfusion. VSs stable, a/o tele called and verified. Skin assessment complete.MD called and updated. Received call from ED RN reporting pt has antibodies working on blood. SRP, RN

## 2019-09-25 ENCOUNTER — Inpatient Hospital Stay (HOSPITAL_COMMUNITY): Payer: Medicare HMO

## 2019-09-25 DIAGNOSIS — D649 Anemia, unspecified: Secondary | ICD-10-CM

## 2019-09-25 DIAGNOSIS — G8929 Other chronic pain: Secondary | ICD-10-CM

## 2019-09-25 DIAGNOSIS — K922 Gastrointestinal hemorrhage, unspecified: Secondary | ICD-10-CM

## 2019-09-25 DIAGNOSIS — J449 Chronic obstructive pulmonary disease, unspecified: Secondary | ICD-10-CM

## 2019-09-25 DIAGNOSIS — Z7901 Long term (current) use of anticoagulants: Secondary | ICD-10-CM

## 2019-09-25 DIAGNOSIS — K921 Melena: Secondary | ICD-10-CM

## 2019-09-25 DIAGNOSIS — D62 Acute posthemorrhagic anemia: Secondary | ICD-10-CM

## 2019-09-25 DIAGNOSIS — J9611 Chronic respiratory failure with hypoxia: Secondary | ICD-10-CM

## 2019-09-25 DIAGNOSIS — D500 Iron deficiency anemia secondary to blood loss (chronic): Secondary | ICD-10-CM

## 2019-09-25 DIAGNOSIS — R1031 Right lower quadrant pain: Secondary | ICD-10-CM

## 2019-09-25 LAB — CBC WITH DIFFERENTIAL/PLATELET
Abs Immature Granulocytes: 0.1 10*3/uL — ABNORMAL HIGH (ref 0.00–0.07)
Basophils Absolute: 0 10*3/uL (ref 0.0–0.1)
Basophils Relative: 0 %
Eosinophils Absolute: 0.1 10*3/uL (ref 0.0–0.5)
Eosinophils Relative: 2 %
HCT: 29.7 % — ABNORMAL LOW (ref 39.0–52.0)
Hemoglobin: 8.7 g/dL — ABNORMAL LOW (ref 13.0–17.0)
Immature Granulocytes: 1 %
Lymphocytes Relative: 13 %
Lymphs Abs: 1.2 10*3/uL (ref 0.7–4.0)
MCH: 23.1 pg — ABNORMAL LOW (ref 26.0–34.0)
MCHC: 29.3 g/dL — ABNORMAL LOW (ref 30.0–36.0)
MCV: 79 fL — ABNORMAL LOW (ref 80.0–100.0)
Monocytes Absolute: 0.7 10*3/uL (ref 0.1–1.0)
Monocytes Relative: 8 %
Neutro Abs: 6.6 10*3/uL (ref 1.7–7.7)
Neutrophils Relative %: 76 %
Platelets: 171 10*3/uL (ref 150–400)
RBC: 3.76 MIL/uL — ABNORMAL LOW (ref 4.22–5.81)
RDW: 21.5 % — ABNORMAL HIGH (ref 11.5–15.5)
WBC: 8.8 10*3/uL (ref 4.0–10.5)
nRBC: 0.2 % (ref 0.0–0.2)

## 2019-09-25 LAB — IRON AND TIBC
Iron: 21 ug/dL — ABNORMAL LOW (ref 45–182)
Saturation Ratios: 5 % — ABNORMAL LOW (ref 17.9–39.5)
TIBC: 409 ug/dL (ref 250–450)
UIBC: 388 ug/dL

## 2019-09-25 LAB — BASIC METABOLIC PANEL
Anion gap: 9 (ref 5–15)
BUN: 20 mg/dL (ref 8–23)
CO2: 21 mmol/L — ABNORMAL LOW (ref 22–32)
Calcium: 8.7 mg/dL — ABNORMAL LOW (ref 8.9–10.3)
Chloride: 106 mmol/L (ref 98–111)
Creatinine, Ser: 1.8 mg/dL — ABNORMAL HIGH (ref 0.61–1.24)
GFR calc Af Amer: 44 mL/min — ABNORMAL LOW (ref >60)
GFR calc non Af Amer: 38 mL/min — ABNORMAL LOW (ref >60)
Glucose, Bld: 106 mg/dL — ABNORMAL HIGH (ref 70–99)
Potassium: 4.4 mmol/L (ref 3.5–5.1)
Sodium: 136 mmol/L (ref 135–145)

## 2019-09-25 LAB — CBC
HCT: 23.7 % — ABNORMAL LOW (ref 39.0–52.0)
Hemoglobin: 6.6 g/dL — CL (ref 13.0–17.0)
MCH: 21 pg — ABNORMAL LOW (ref 26.0–34.0)
MCHC: 27.8 g/dL — ABNORMAL LOW (ref 30.0–36.0)
MCV: 75.5 fL — ABNORMAL LOW (ref 80.0–100.0)
Platelets: 182 10*3/uL (ref 150–400)
RBC: 3.14 MIL/uL — ABNORMAL LOW (ref 4.22–5.81)
RDW: 19.9 % — ABNORMAL HIGH (ref 11.5–15.5)
WBC: 8.4 10*3/uL (ref 4.0–10.5)
nRBC: 0 % (ref 0.0–0.2)

## 2019-09-25 LAB — COMPREHENSIVE METABOLIC PANEL
ALT: 16 U/L (ref 0–44)
AST: 15 U/L (ref 15–41)
Albumin: 3.4 g/dL — ABNORMAL LOW (ref 3.5–5.0)
Alkaline Phosphatase: 79 U/L (ref 38–126)
Anion gap: 8 (ref 5–15)
BUN: 19 mg/dL (ref 8–23)
CO2: 22 mmol/L (ref 22–32)
Calcium: 8.8 mg/dL — ABNORMAL LOW (ref 8.9–10.3)
Chloride: 107 mmol/L (ref 98–111)
Creatinine, Ser: 1.81 mg/dL — ABNORMAL HIGH (ref 0.61–1.24)
GFR calc Af Amer: 44 mL/min — ABNORMAL LOW (ref >60)
GFR calc non Af Amer: 38 mL/min — ABNORMAL LOW (ref >60)
Glucose, Bld: 96 mg/dL (ref 70–99)
Potassium: 4.4 mmol/L (ref 3.5–5.1)
Sodium: 137 mmol/L (ref 135–145)
Total Bilirubin: 0.8 mg/dL (ref 0.3–1.2)
Total Protein: 7.1 g/dL (ref 6.5–8.1)

## 2019-09-25 LAB — HEMOGLOBIN AND HEMATOCRIT, BLOOD
HCT: 32.4 % — ABNORMAL LOW (ref 39.0–52.0)
Hemoglobin: 9.6 g/dL — ABNORMAL LOW (ref 13.0–17.0)

## 2019-09-25 LAB — FERRITIN: Ferritin: 9 ng/mL — ABNORMAL LOW (ref 24–336)

## 2019-09-25 LAB — VITAMIN B12: Vitamin B-12: 195 pg/mL (ref 180–914)

## 2019-09-25 LAB — MAGNESIUM: Magnesium: 2.2 mg/dL (ref 1.7–2.4)

## 2019-09-25 MED ORDER — IOHEXOL 9 MG/ML PO SOLN
500.0000 mL | ORAL | Status: AC
  Administered 2019-09-25: 500 mL via ORAL

## 2019-09-25 MED ORDER — LIP MEDEX EX OINT
1.0000 "application " | TOPICAL_OINTMENT | CUTANEOUS | Status: DC | PRN
  Administered 2019-09-25: 1 via TOPICAL
  Filled 2019-09-25 (×2): qty 7

## 2019-09-25 MED ORDER — IOHEXOL 9 MG/ML PO SOLN
ORAL | Status: AC
  Administered 2019-09-25: 500 mL via ORAL
  Filled 2019-09-25: qty 1000

## 2019-09-25 NOTE — Plan of Care (Signed)

## 2019-09-25 NOTE — Consult Note (Signed)
WOC Nurse Consult Note: Reason for Consult:Consulted due to right scrotal swelling.  Patient states he saw urologist related to this and was prescribed Levaquin, Flomax and analgesia.  This has been continued while in house.   Wound type:infectious  Pressure Injury POA: NA Measurement:Right scrotum enlarged and tender to touch Wound TYV:DPBA Drainage (amount, consistency, odor) none Periwound:tenderness Dressing procedure/placement/frequency: No topical treatment at this time.   Will not follow at this time.  Please re-consult if needed.  Domenic Moras MSN, RN, FNP-BC CWON Wound, Ostomy, Continence Nurse Pager [phone]

## 2019-09-25 NOTE — Progress Notes (Addendum)
Gastroenterology Progress Note  CC:  Anemia, melena   Subjective: He complains of having epigastric, right mid abdominal and flank pain. No BM since admission.   Note: patient initially indicated he had a colonoscopy with biopsies at the [facility] on 09/08/2019. Further discussion with the patient today verified he did not have a colonoscopy, he underwent a prostate biopsy. He is now telling me he's never had a colonoscopy.   Objective:  Vital signs in last 24 hours: Temp:  [98.3 F (36.8 C)-98.8 F (37.1 C)] 98.8 F (37.1 C) (03/26 0617) Pulse Rate:  [70-94] 70 (03/26 0617) Resp:  [14-21] 16 (03/26 0617) BP: (107-153)/(56-81) 140/74 (03/26 0617) SpO2:  [100 %] 100 % (03/26 0617) FiO2 (%):  [2 %] 2 % (03/25 1520) Weight:  [74.4 kg] 74.4 kg (03/25 1845) Last BM Date: 09/24/19 General:  Alert male sitting at side of bed in NAD.  Heart: RRR, systolic murmur.  Pulm: Few tight expiratory wheezes bilaterally. Diminished breath sounds throughout.  Abdomen: Protuberant, moderate tenderness right mid abdomen to flank area. No obvious HSM. + BS x 4 quads. Midline abdominal scar intact. Diastasis recti.  Extremities:  Without edema. Neurologic:  Alert and  oriented x4;  grossly normal neurologically. Psych:  Alert and cooperative. Normal mood and affect.  Intake/Output from previous day: 03/25 0701 - 03/26 0700 In: 1290.7 [I.V.:464.9; Blood:342.5; IV Piggyback:483.3] Out: 1000 [Urine:1000] Intake/Output this shift: Total I/O In: 275 [Blood:275] Out: -   Lab Results: Recent Labs    09/24/19 1525 09/25/19 0422 09/25/19 1045  WBC 9.3 8.4 8.8  HGB 6.2* 6.6* 8.7*  HCT 22.7* 23.7* 29.7*  PLT 165 182 171   BMET Recent Labs    09/24/19 1525 09/25/19 0422 09/25/19 1045  NA 134* 136 137  K 4.7 4.4 4.4  CL 104 106 107  CO2 22 21* 22  GLUCOSE 107* 106* 96  BUN 20 20 19   CREATININE 2.02* 1.80* 1.81*  CALCIUM 8.6* 8.7* 8.8*   LFT Recent Labs   09/25/19 1045  PROT 7.1  ALBUMIN 3.4*  AST 15  ALT 16  ALKPHOS 79  BILITOT 0.8   PT/INR No results for input(s): LABPROT, INR in the last 72 hours. Hepatitis Panel No results for input(s): HEPBSAG, HCVAB, HEPAIGM, HEPBIGM in the last 72 hours.  DG Chest Portable 1 View  Result Date: 09/24/2019 CLINICAL DATA:  Low hemoglobin. Dark stools. Prior history of nephrectomy. COPD. EXAM: PORTABLE CHEST 1 VIEW COMPARISON:  Chest x-ray 04/13/2019 02/28/2019.  CT 09/26/2018. FINDINGS: Mediastinum and hilar structures normal. Heart size stable. Overlying EKG leads and tubing make evaluation difficult. Mild right mid lung field acute infiltrate cannot be excluded. Chronic interstitial changes and changes of COPD noted. No pleural effusion or pneumothorax. IMPRESSION: 1. Mild right mid lung infiltrate cannot be excluded. Overlying EKG leads and tubing make evaluation difficult. Follow-up exams to demonstrate clearing suggested. 2.  Chronic interstitial changes and changes of COPD again noted. Electronically Signed   By: Marcello Moores  Register   On: 09/24/2019 16:31    Assessment / Plan:  26.  69 year old male presents to the ED with dizziness and melenic stool with profound IDA in the setting of active reflux symptoms with pill dysphagia taking Pepto-Bismol twice daily for the past month.  Hemoglobin 6.2 down from his baseline of 10.2. Received 2 units of PRBCs 3/25. Today Hg 8.7. Iron 21. TIBC 409. Ferritin 9.  Stool guaiac negative. -Hold Eliquis for now -EGD and colonoscopy  after Eliquis washout, most likely on Sunday 3/28 -Pantoprazole 40 mg IV every 24 hours   2. RUQ/right flank pain. Normal UA on 3/25.  -abd/pelvic CT without IV contrast today  3.  History of DVT/PE 2019 on Eliquis. -Hold Eliquis for now  4.  Acute on chronic microcytic anemia -See plan in #1  5. 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 Chronic scrotal edema. -Consider  urology consult   6. CKD. Cr. 1.81.      Active Problems:   Symptomatic anemia     LOS: 1 day   Noralyn Pick  09/25/2019, 12:00 PM  GI ATTENDING  Interval history and data reviewed.  Agree with comprehensive consultation note as outlined above.  Patient now denies having had colonoscopy as previously thought.  Rather, prostate biopsy.  In any event he needs his iron deficiency anemia (while he was on Eliquis) worked up.  Also evaluation of his abdominal pain.  Plans for CT scan.  After Eliquis washout, colonoscopy and upper endoscopy (likely Sunday), though I suspect that his subacute event was upper GI in nature.  Patient is high risk for procedures given his comorbidities and needs for chronic anticoagulation therapy.  Docia Chuck. Geri Seminole., M.D. [facility] Division of Gastroenterology

## 2019-09-25 NOTE — Progress Notes (Signed)
PT Cancellation Note  Patient Details Name: Gregory Christensen MRN: [id number] DOB: 13-Nov-1950   Cancelled Treatment:    Reason Eval/Treat Not Completed: Medical issues which prohibited therapy.  Pt Hgb 6.6, BP dropped/limited session with OT, will defer today and continue efforts to complete PT eval    [facility] 09/25/2019, 10:05 AM

## 2019-09-25 NOTE — Progress Notes (Signed)
Patient ID: Gregory Christensen, male   DOB: 1951/02/08, 69 y.o.   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  Patient had a colonoscopy done on 09/08/2019 at [facility], results unknown.  GI was consulted.  2 unit packed red cells were transfused.  Assessment & Plan:  Acute blood loss anemia Probable GI bleeding -Presented with hemoglobin of 6.2; baseline of 10.  Was on Eliquis for history of DVT and PE.  Eliquis on hold.  Patient had a recent colonoscopy in [place], results unknown. -Status post 2 units packed red cells transfusion since admission.  Will check stat labs this morning. -GI following.  Currently on IV Protonix.  Follow further GI recommendations.  Monitor H&H.  Acute kidney injury on chronic kidney disease stage IIIa -Baseline creatinine around 1.6.  Presented with creatinine of 2.02.  Improving to 1.8.  Continue Ringer lactate at 50 cc an hour.  History of DVT and PE -Eliquis on hold.  Chronic hypoxic respiratory failure COPD -On 2 L oxygen at home via nasal cannula chronically.  Currently stable.  Continue Dulera and as needed nebs.  Right scrotal incision from hydrocele -Continue oral antibiotics that the patient was on prior to presentation.  Outpatient follow-up with urology  DVT prophylaxis: SCDs.  Eliquis on hold. Code Status: Full Family Communication: Spoke to patient at bedside Disposition Plan: Home once with days once bleeding improves, hemoglobin stabilizes and patient is cleared by GI.  Consultants: GI  Procedures: None  Antimicrobials: Outpatient Levaquin continued  Subjective: Patient seen and  examined at bedside.  No black or bloody stools since admission.  No overnight fever, worsening shortness of breath or chest pain.  Objective: Vitals:   09/25/19 0333 09/25/19 0549 09/25/19 0557 09/25/19 0617  BP: 124/62 (!) 142/81 (!) 141/80 140/74  Pulse: 80 75 74 70  Resp: 16 16 16 16   Temp: 98.8 F (37.1 C) 98.4 F (36.9 C) 98.3 F (36.8 C) 98.8 F (37.1 C)  TempSrc: Oral Oral Oral Oral  SpO2: 100% 100% 100% 100%  Weight:      Height:        Intake/Output Summary (Last 24 hours) at 09/25/2019 0947 Last data filed at 09/25/2019 0800 Gross per 24 hour  Intake 1565.68 ml  Output 1000 ml  Net 565.68 ml   Filed Weights   09/24/19 1845  Weight: 74.4 kg    Examination:  General exam: Appears calm and comfortable.  Currently on 2 L oxygen via nasal cannula. Respiratory system: Bilateral decreased breath sounds at bases with some scattered crackles Cardiovascular system: S1 & S2 heard, Rate controlled Gastrointestinal system: Abdomen is nondistended, soft and nontender. Normal bowel sounds heard. Extremities: No cyanosis, clubbing; trace lower extremity edema Central nervous system: Alert and oriented. No focal neurological deficits. Moving extremities Skin: No rashes, lesions or ulcers Psychiatry: Flat affect    Data Reviewed: I have personally reviewed following labs and imaging studies  CBC: Recent Labs  Lab 09/24/19 1525 09/25/19 0422  WBC 9.3 8.4  HGB 6.2* 6.6*  HCT 22.7* 23.7*  MCV 74.9* 75.5*  PLT 165 697   Basic Metabolic Panel: Recent  Labs  Lab 09/24/19 1525 09/25/19 0422  NA 134* 136  K 4.7 4.4  CL 104 106  CO2 22 21*  GLUCOSE 107* 106*  BUN 20 20  CREATININE 2.02* 1.80*  CALCIUM 8.6* 8.7*   GFR: Estimated Creatinine Clearance: 41.3 mL/min (A) (by C-G formula based on SCr of 1.8 mg/dL (H)). Liver Function Tests: Recent Labs  Lab 09/24/19 1525  AST 14*  ALT 17  ALKPHOS 77  BILITOT 0.3  PROT 7.3  ALBUMIN 3.3*   No results for  input(s): LIPASE, AMYLASE in the last 168 hours. No results for input(s): AMMONIA in the last 168 hours. Coagulation Profile: No results for input(s): INR, PROTIME in the last 168 hours. Cardiac Enzymes: No results for input(s): CKTOTAL, CKMB, CKMBINDEX, TROPONINI in the last 168 hours. BNP (last 3 results) No results for input(s): PROBNP in the last 8760 hours. HbA1C: No results for input(s): HGBA1C in the last 72 hours. CBG: No results for input(s): GLUCAP in the last 168 hours. Lipid Profile: No results for input(s): CHOL, HDL, LDLCALC, TRIG, CHOLHDL, LDLDIRECT in the last 72 hours. Thyroid Function Tests: No results for input(s): TSH, T4TOTAL, FREET4, T3FREE, THYROIDAB in the last 72 hours. Anemia Panel: Recent Labs    09/25/19 0422  VITAMINB12 195  FERRITIN 9*  TIBC 409  IRON 21*   Sepsis Labs: No results for input(s): PROCALCITON, LATICACIDVEN in the last 168 hours.  Recent Results (from the past 240 hour(s))  SARS CORONAVIRUS 2 (TAT 6-24 HRS) Nasopharyngeal Nasopharyngeal Swab     Status: None   Collection Time: 09/24/19  4:24 PM   Specimen: Nasopharyngeal Swab  Result Value Ref Range Status   SARS Coronavirus 2 NEGATIVE NEGATIVE Final    Comment: (NOTE) SARS-CoV-2 target nucleic acids are NOT DETECTED. The SARS-CoV-2 RNA is generally detectable in upper and lower respiratory specimens during the acute phase of infection. Negative results do not preclude SARS-CoV-2 infection, do not rule out co-infections with other pathogens, and should not be used as the sole basis for treatment or other patient management decisions. Negative results must be combined with clinical observations, patient history, and epidemiological information. The expected result is Negative. Fact Sheet for Patients: SugarRoll.be Fact Sheet for Healthcare Providers: https://www.woods-mathews.com/ This test is not yet approved or cleared by the [place] FDA and  has been authorized for detection and/or diagnosis of SARS-CoV-2 by FDA under an Emergency Use Authorization (EUA). This EUA will remain  in effect (meaning this test can be used) for the duration of the COVID-19 declaration under Section 56 4(b)(1) of the Act, 21 U.S.C. section 360bbb-3(b)(1), unless the authorization is terminated or revoked sooner. Performed at [facility], [place] [address] [place], [place] [address]          Radiology Studies: DG Chest Portable 1 View  Result Date: 09/24/2019 CLINICAL DATA:  Low hemoglobin. Dark stools. Prior history of nephrectomy. COPD. EXAM: PORTABLE CHEST 1 VIEW COMPARISON:  Chest x-ray 04/13/2019 02/28/2019.  CT 09/26/2018. FINDINGS: Mediastinum and hilar structures normal. Heart size stable. Overlying EKG leads and tubing make evaluation difficult. Mild right mid lung field acute infiltrate cannot be excluded. Chronic interstitial changes and changes of COPD noted. No pleural effusion or pneumothorax. IMPRESSION: 1. Mild right mid lung infiltrate cannot be excluded. Overlying EKG leads and tubing make evaluation difficult. Follow-up exams to demonstrate clearing suggested. 2.  Chronic interstitial changes and changes of COPD again noted. Electronically Signed   By: Marcello Moores  Register  On: 09/24/2019 16:31        Scheduled Meds: . levofloxacin  500 mg Oral Daily  . loratadine  10 mg Oral Daily  . mometasone-formoterol  2 puff Inhalation BID  . montelukast  10 mg Oral QHS  . pantoprazole (PROTONIX) IV  40 mg Intravenous Daily  . tamsulosin  0.4 mg Oral Q12H  . umeclidinium bromide  1 puff Inhalation Daily   Continuous Infusions: . sodium chloride Stopped (09/24/19 1618)  . lactated ringers 50 mL/hr at 09/25/19 0835          Aline August, MD [facility] 09/25/2019, 9:47 AM

## 2019-09-25 NOTE — Evaluation (Signed)
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  Patient had a colonoscopy done on 09/08/2019 at [facility], results unknown.  GI was consulted.  2 unit packed red cells were transfused.   Clinical Impression   Patient is a 69 year old male that lives with his 46 year old mother in a single level home with 2 steps to enter. Patient reports modified independent at baseline with ambulation using cane for stability and 2L O2, mod I with self care. Currently patient is supervision level for transfer and self care due to ongoing dizziness with standing. Pt seated at EOB upon arrival BP 134/69. In standing patient BP 105/66 with + dizziness. RN reported patient received 2 units of RBC this AM, however patient states they just finished 2nd one 5 mins before OT arrived. Will continue to follow to maximize patient safety and independence with self care.    Follow Up Recommendations  Home health OT;Other (comment)(vs no OT follow up pending progress)    Equipment Recommendations  None recommended by OT       Precautions / Restrictions Precautions Precautions: Fall Precaution Comments: monitor for orthostatic hypotension Restrictions Weight Bearing Restrictions: No      Mobility Bed Mobility Overal bed mobility: Modified Independent                Transfers Overall transfer level: Needs assistance Equipment used: None Transfers: Sit to/from Stand Sit to Stand: Supervision         General transfer comment: no physical assist, supervision for safety    Balance Overall balance assessment: Mild deficits observed, not  formally tested                                         ADL either performed or assessed with clinical judgement   ADL Overall ADL's : Needs assistance/impaired Eating/Feeding: Independent   Grooming: Set up;Sitting   Upper Body Bathing: Set up;Sitting   Lower Body Bathing: Supervison/ safety;Sit to/from stand;Sitting/lateral leans   Upper Body Dressing : Set up;Sitting   Lower Body Dressing: Supervision/safety;Sitting/lateral leans;Sit to/from stand   Toilet Transfer: Supervision/safety;Stand-pivot Armed forces technical officer Details (indicate cue type and reason): simulated with functional mobility, deferred further activity due to dizziness in standing. no physical assist necessary to stand and take few steps at edge of bed Toileting- Clothing Manipulation and Hygiene: Supervision/safety;Sitting/lateral lean;Sit to/from stand       Functional mobility during ADLs: Supervision/safety General ADL Comments: patient activity tolerance currently limited due to dizziness with standing and recent low H+H, pt just finished 2nd blood transfusion prior to OT     Vision Baseline Vision/History: Wears glasses Wears Glasses: At all times              Pertinent Vitals/Pain Pain Assessment: Faces Faces Pain Scale: Hurts even more Pain Location: groin/scrotum with mobility Pain Descriptors / Indicators: Discomfort;Grimacing Pain Intervention(s): Monitored during session     Hand Dominance Right   Extremity/Trunk Assessment Upper Extremity Assessment Upper Extremity Assessment: Overall WFL for tasks assessed   Lower Extremity Assessment  Lower Extremity Assessment: Defer to PT evaluation       Communication Communication Communication: No difficulties   Cognition Arousal/Alertness: Awake/alert Behavior During Therapy: WFL for tasks assessed/performed Overall Cognitive Status: Within Functional Limits for tasks assessed                                      General Comments  BP EOB 134/69 O2 100% on 2L, BP standing 105/66 O2 98% on 2L. deferred further mobility due to systolic BP drop and pt feeling dizzy            Home Living Family/patient expects to be discharged to:: Private residence Living Arrangements: Parent(58 year old mother) Available Help at Discharge: Family;Available PRN/intermittently Type of Home: House Home Access: Stairs to enter [employer] of Steps: 2 in front, 4 in back Entrance Stairs-Rails: None Home Layout: One level     Bathroom Shower/Tub: Occupational psychologist: Standard Bathroom Accessibility: Yes How Accessible: Accessible via walker Home Equipment: Shower seat;Cane - single point;Walker - 2 wheels;Walker - 4 wheels;Bedside commode;Wheelchair - manual;Grab bars - tub/shower   Additional Comments: home O2 at 2L       Prior Functioning/Environment Level of Independence: Independent with assistive device(s)        Comments: uses cane sometimes when he goes out. can walk without it.        OT Problem List: Decreased activity tolerance;Impaired balance (sitting and/or standing)      OT Treatment/Interventions: Self-care/ADL training;Therapeutic exercise;Therapeutic activities;Patient/family education;Balance training    OT Goals(Current goals can be found in the care plan section) Acute Rehab OT Goals Patient Stated Goal: to feel better OT Goal Formulation: With patient Time For Goal Achievement: 10/09/19 Potential to Achieve Goals: Good  OT Frequency: Min 2X/week              AM-PAC OT "6 Clicks" Daily Activity     Outcome Measure Help from another person eating meals?: None Help from another person taking care of personal grooming?: A Little Help from another person toileting, which includes using toliet, bedpan, or urinal?: A Little Help from another person bathing (including washing, rinsing, drying)?: A Little Help from another person to put on and  taking off regular upper body clothing?: A Little Help from another person to put on and taking off regular lower body clothing?: A Little 6 Click Score: 19   End of Session Equipment Utilized During Treatment: Oxygen  Activity Tolerance: Treatment limited secondary to medical complications (Comment)(orthostatic hypotension) Patient left: in bed;with call bell/phone within reach  OT Visit Diagnosis: Other abnormalities of gait and mobility (R26.89)                Time: 3300-7622 OT Time Calculation (min): 24 min Charges:  OT General Charges $OT Visit: 1 Visit OT Evaluation $OT Eval Moderate Complexity: 1 Mod OT Treatments $Self Care/Home Management : 8-22 mins  Shon Millet OT OT office: [place] 09/25/2019, 12:22 PM

## 2019-09-26 DIAGNOSIS — D500 Iron deficiency anemia secondary to blood loss (chronic): Secondary | ICD-10-CM

## 2019-09-26 DIAGNOSIS — R195 Other fecal abnormalities: Secondary | ICD-10-CM

## 2019-09-26 LAB — CBC
HCT: 29.4 % — ABNORMAL LOW (ref 39.0–52.0)
Hemoglobin: 8.8 g/dL — ABNORMAL LOW (ref 13.0–17.0)
MCH: 23.5 pg — ABNORMAL LOW (ref 26.0–34.0)
MCHC: 29.9 g/dL — ABNORMAL LOW (ref 30.0–36.0)
MCV: 78.6 fL — ABNORMAL LOW (ref 80.0–100.0)
Platelets: 201 10*3/uL (ref 150–400)
RBC: 3.74 MIL/uL — ABNORMAL LOW (ref 4.22–5.81)
RDW: 21.2 % — ABNORMAL HIGH (ref 11.5–15.5)
WBC: 8.6 10*3/uL (ref 4.0–10.5)
nRBC: 0 % (ref 0.0–0.2)

## 2019-09-26 LAB — BASIC METABOLIC PANEL
Anion gap: 8 (ref 5–15)
BUN: 16 mg/dL (ref 8–23)
CO2: 21 mmol/L — ABNORMAL LOW (ref 22–32)
Calcium: 8.8 mg/dL — ABNORMAL LOW (ref 8.9–10.3)
Chloride: 106 mmol/L (ref 98–111)
Creatinine, Ser: 1.79 mg/dL — ABNORMAL HIGH (ref 0.61–1.24)
GFR calc Af Amer: 44 mL/min — ABNORMAL LOW (ref >60)
GFR calc non Af Amer: 38 mL/min — ABNORMAL LOW (ref >60)
Glucose, Bld: 96 mg/dL (ref 70–99)
Potassium: 4.4 mmol/L (ref 3.5–5.1)
Sodium: 135 mmol/L (ref 135–145)

## 2019-09-26 MED ORDER — POLYETHYLENE GLYCOL 3350 17 G PO PACK
17.0000 g | PACK | ORAL | Status: AC
  Administered 2019-09-26 (×2): 17 g via ORAL
  Filled 2019-09-26 (×2): qty 1

## 2019-09-26 MED ORDER — SODIUM CHLORIDE 0.9 % IV SOLN
510.0000 mg | Freq: Once | INTRAVENOUS | Status: AC
  Administered 2019-09-26: 510 mg via INTRAVENOUS
  Filled 2019-09-26: qty 510

## 2019-09-26 MED ORDER — PEG-KCL-NACL-NASULF-NA ASC-C 100 G PO SOLR
0.5000 | Freq: Once | ORAL | Status: AC
  Administered 2019-09-26: 100 g via ORAL
  Filled 2019-09-26: qty 1

## 2019-09-26 MED ORDER — POLYETHYLENE GLYCOL 3350 17 G PO PACK
17.0000 g | PACK | Freq: Once | ORAL | Status: AC
  Administered 2019-09-26: 17 g via ORAL
  Filled 2019-09-26: qty 1

## 2019-09-26 MED ORDER — PEG-KCL-NACL-NASULF-NA ASC-C 100 G PO SOLR: 1.0000 | Freq: Once | ORAL | Status: DC

## 2019-09-26 MED ORDER — ACETAMINOPHEN 325 MG PO TABS
650.0000 mg | ORAL_TABLET | Freq: Once | ORAL | Status: AC
  Administered 2019-09-26: 650 mg via ORAL
  Filled 2019-09-26: qty 2

## 2019-09-26 NOTE — Progress Notes (Addendum)
[facility] Progress Note  CC:  Anemia, melena   Subjective: He continues to have RUQ/flank pain, not severe. He passed a small hard brown stool this morning. No other complaints at this time.  Objective:   Abdominal/pelvic CT with oral contrast 09/25/2019: 1. Unremarkable appearance of the right kidney without evidence for hydronephrosis or radiopaque kidney stone. 2. Normal appendix. 3. There is a large amount of stool in the patient's colon. 4. Status post left nephrectomy. 5. Aortic Atherosclerosis    Vital signs in last 24 hours: Temp:  [98 F (36.7 C)-98.8 F (37.1 C)] 98 F (36.7 C) (03/26 2235) Pulse Rate:  [70-75] 73 (03/26 2235) Resp:  [13-21] 18 (03/26 2235) BP: (127-148)/(59-81) 127/59 (03/26 2235) SpO2:  [98 %-100 %] 98 % (03/26 2235) Last BM Date: 09/24/19 General:   Alert 69 year old male in NAD.  Heart: RRR, systolic murmur.  Pulm: Less expiratory wheezes today, diminished throughout. On O2 [place] 2L.   Abdomen: Protuberant, soft, moderate tenderness RUQ, right flank without rebound or guarding. Hypoactive BS x 4 quads. Stool in commode small amount of hard brown stool grossly heme +.  Extremities:  Without edema. Neurologic:  Alert and  oriented x4;  grossly normal neurologically. Psych:  Alert and cooperative. Normal mood and affect.  Intake/Output from previous day: 03/26 0701 - 03/27 0700 In: 2215.8 [P.O.:1620; I.V.:320.8; Blood:275] Out: 2725 [Urine:2725] Intake/Output this shift: Total I/O In: -  Out: 1500 [Urine:1500]  Lab Results: Recent Labs    09/25/19 0422 09/25/19 0422 09/25/19 1045 09/25/19 1836 09/26/19 0438  WBC 8.4  --  8.8  --  8.6  HGB 6.6*   < > 8.7* 9.6* 8.8*  HCT 23.7*   < > 29.7* 32.4* 29.4*  PLT 182  --  171  --  201   < > = values in this interval not displayed.   BMET Recent Labs    09/24/19 1525 09/25/19 0422 09/25/19 1045  NA 134* 136 137  K 4.7 4.4 4.4  CL 104 106 107  CO2 22 21* 22   GLUCOSE 107* 106* 96  BUN 20 20 19   CREATININE 2.02* 1.80* 1.81*  CALCIUM 8.6* 8.7* 8.8*   LFT Recent Labs    09/25/19 1045  PROT 7.1  ALBUMIN 3.4*  AST 15  ALT 16  ALKPHOS 79  BILITOT 0.8   PT/INR No results for input(s): LABPROT, INR in the last 72 hours. Hepatitis Panel No results for input(s): HEPBSAG, HCVAB, HEPAIGM, HEPBIGM in the last 72 hours.  CT ABDOMEN PELVIS WO CONTRAST  Result Date: 09/25/2019 CLINICAL DATA:  Abdominal pain. Right flank pain. Anemia. History of renal cancer status post left oophorectomy. EXAM: CT ABDOMEN AND PELVIS WITHOUT CONTRAST TECHNIQUE: Multidetector CT imaging of the abdomen and pelvis was performed following the standard protocol without IV contrast. COMPARISON:  04/27/2019 FINDINGS: Lower chest: There are emphysematous changes at the lung bases bilaterally.The heart size is normal. Hepatobiliary: The liver is normal. Normal gallbladder.There is no biliary ductal dilation. Pancreas: Normal contours without ductal dilatation. No peripancreatic fluid collection. Spleen: No splenic laceration or hematoma. Adrenals/Urinary Tract: --Adrenal glands: No adrenal hemorrhage. --Right kidney/ureter: No hydronephrosis or perinephric hematoma. --Left kidney/ureter: The patient is status post prior left nephrectomy. There is no convincing soft tissue within the left oophorectomy bed. --Urinary bladder: Unremarkable. Stomach/Bowel: --Stomach/Duodenum: No hiatal hernia or other gastric abnormality. Normal duodenal course and caliber. --Small bowel: No dilatation or inflammation. --Colon: There is a large amount of  stool in the patient's colon. There is scattered colonic diverticula without CT evidence for diverticulitis. --Appendix: Normal. Vascular/Lymphatic: Atherosclerotic calcification is present within the non-aneurysmal abdominal aorta, without hemodynamically significant stenosis. --No retroperitoneal lymphadenopathy. --No mesenteric lymphadenopathy. --No pelvic  or inguinal lymphadenopathy. Reproductive: Prostate gland is significantly enlarged. Other: No ascites or free air. The abdominal wall is normal. Musculoskeletal. No acute displaced fractures. IMPRESSION: 1. Unremarkable appearance of the right kidney without evidence for hydronephrosis or radiopaque kidney stone. 2. Normal appendix. 3. There is a large amount of stool in the patient's colon. 4. Status post left nephrectomy. 5. Aortic Atherosclerosis (ICD10-I70.0). Electronically Signed   By: Constance Holster M.D.   On: 09/25/2019 19:24   DG Chest Portable 1 View  Result Date: 09/24/2019 CLINICAL DATA:  Low hemoglobin. Dark stools. Prior history of nephrectomy. COPD. EXAM: PORTABLE CHEST 1 VIEW COMPARISON:  Chest x-ray 04/13/2019 02/28/2019.  CT 09/26/2018. FINDINGS: Mediastinum and hilar structures normal. Heart size stable. Overlying EKG leads and tubing make evaluation difficult. Mild right mid lung field acute infiltrate cannot be excluded. Chronic interstitial changes and changes of COPD noted. No pleural effusion or pneumothorax. IMPRESSION: 1. Mild right mid lung infiltrate cannot be excluded. Overlying EKG leads and tubing make evaluation difficult. Follow-up exams to demonstrate clearing suggested. 2.  Chronic interstitial changes and changes of COPD again noted. Electronically Signed   By: Marcello Moores  Register   On: 09/24/2019 16:31    Assessment / Plan:  35. 69 year old male presents to the ED with dizziness and melenic stool with profound IDA in the setting of active reflux symptoms with pill dysphagia taking Pepto-Bismol twice daily for the past month. Hemoglobin 6.2 down from his baseline of 10.2. Received 2 units of PRBCs 3/25. Today Hg 8.8. Iron 21. TIBC 409. Ferritin 9. Today solid brown stool grossly heme +.  -Hold Eliquis for now -EGD and colonoscopy Sunday 3/28, benefits and risks discussed including risk with sedation, risk of bleeding, infection and perforation.  -Clear liquid diet  then NPO after midnight. -Pantoprazole 40 mg IV every 24 hours.  -Recommend IV iron prior.  -Miralax dose this am.  2. RUQ/right flank pain. CTAP 3/26 showed a large amount of stool in the colon.  -See plan in # 1  3.History of DVT/PE 2019on Eliquis. -Hold Eliquis for now  4. Acute on chronic microcytic anemia -See plan in #1  5. AKI on CKD, improving Today Cr. 1.79.   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 Chronic scrotal edema. Seen by [facility] NP 3/26, continue Levaquin, Flomax.   7. COPD. On 2L [place].  Further recommendations per Dr. Henrene Pastor  Active Problems:   Symptomatic anemia   Iron deficiency anemia due to chronic blood loss   Melena   Abdominal pain, chronic, right lower quadrant   Chronic anticoagulation    LOS: 2 days   Patrecia Pour Kennedy-Smith  09/26/2019, 5:15 AM  GI ATTENDING  Interval history data reviewed.  Patient personally seen and examined.  Agree with comprehensive interval progress note as outlined above.  Patient stable post transfusion.  Plans are for colonoscopy and upper endoscopy tomorrow off Eliquis to evaluate iron deficiency anemia.  The patient is HIGH RISK given his advanced comorbidities and the need for chronic anticoagulation therapy.  His procedure will be performed with monitored anesthesia care.The nature of the procedure, as well as the risks, benefits, and alternatives were carefully and thoroughly reviewed with the patient. Ample time for discussion and  questions allowed. The patient understood, was satisfied, and agreed to proceed.  Docia Chuck. Geri Seminole., M.D. [facility] Division of Gastroenterology

## 2019-09-26 NOTE — Plan of Care (Signed)
  Problem: Health Behavior/Discharge Planning: Goal: Ability to manage health-related needs will improve Outcome: Progressing   Problem: Clinical Measurements: Goal: Ability to maintain clinical measurements within normal limits will improve Outcome: Progressing Goal: Will remain free from infection Outcome: Progressing Goal: Diagnostic test results will improve Outcome: Progressing Goal: Cardiovascular complication will be avoided Outcome: Progressing   Problem: Activity: Goal: Risk for activity intolerance will decrease Outcome: Progressing   Problem: Nutrition: Goal: Adequate nutrition will be maintained Outcome: Progressing   Problem: Coping: Goal: Level of anxiety will decrease Outcome: Progressing   Problem: Elimination: Goal: Will not experience complications related to bowel motility Outcome: Progressing Goal: Will not experience complications related to urinary retention Outcome: Progressing   Problem: Pain Managment: Goal: General experience of comfort will improve Outcome: Progressing   Problem: Safety: Goal: Ability to remain free from injury will improve Outcome: Progressing   Problem: Skin Integrity: Goal: Risk for impaired skin integrity will decrease Outcome: Progressing   Problem: Education: Goal: Knowledge of General Education information will improve Description: Including pain rating scale, medication(s)/side effects and non-pharmacologic comfort measures Outcome: Adequate for Discharge   Problem: Clinical Measurements: Goal: Respiratory complications will improve Outcome: Adequate for Discharge

## 2019-09-26 NOTE — Progress Notes (Signed)
Patient ID: Gregory Christensen, male   DOB: 12-30-1950, 69 y.o.   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  Patient had a colonoscopy done on 09/08/2019 at [facility], results unknown.  GI was consulted.  2 unit packed red cells were transfused.  Assessment & Plan:  Acute blood loss anemia Probable GI bleeding -Presented with hemoglobin of 6.2; baseline of 10.  Was on Eliquis for history of DVT and PE.  Eliquis on hold.  Patient had a recent colonoscopy in [place], results unknown. -Status post 2 units packed red cells transfusion since admission.  Hemoglobin 8.8 this morning. -GI following.  Currently on IV Protonix.  Follow further GI recommendations.  Monitor H&H.  Acute kidney injury on chronic kidney disease stage IIIa -Baseline creatinine around 1.6.  Presented with creatinine of 2.02.  Improving to 1.79 today.  DC IV fluids  History of DVT and PE -Eliquis on hold.  Chronic hypoxic respiratory failure COPD -On 2 L oxygen at home via nasal cannula chronically.  Currently stable.  Continue Dulera and as needed nebs.  Right scrotal incision from hydrocele -Continue oral antibiotics that the patient was on prior to presentation.  Outpatient follow-up with urology  DVT prophylaxis: SCDs.  Eliquis on hold. Code Status: Full Family Communication: Spoke to patient at bedside Disposition Plan: Will need EGD/colonoscopy probably on 09/27/2019 and hence will remain inpatient.  Discharge home most likely in 2 to 3 days once cleared by GI.  Consultants: GI  Procedures: None  Antimicrobials: Outpatient Levaquin  continued  Subjective: Patient seen and examined at bedside.  Denies worsening shortness of breath.  No overnight fever, vomiting, black or bloody stools.  Complains of right upper quadrant pain.  objective: Vitals:   09/25/19 1600 09/25/19 1700 09/25/19 1800 09/25/19 2235  BP:    (!) 127/59  Pulse:    73  Resp: (!) 21 14 13 18   Temp:    98 F (36.7 C)  TempSrc:    Oral  SpO2:    98%  Weight:      Height:        Intake/Output Summary (Last 24 hours) at 09/26/2019 0745 Last data filed at 09/26/2019 0547 Gross per 24 hour  Intake 2897.59 ml  Output 2725 ml  Net 172.59 ml   Filed Weights   09/24/19 1845  Weight: 74.4 kg    Examination:  General exam: No acute distress.  Poor historian.  Currently on 2 L oxygen via nasal cannula. Respiratory system: Bilateral decreased breath sounds at bases, no wheezing cardiovascular system: Rate controlled, S1-S2 heard Gastrointestinal system: Abdomen is nondistended, soft and nontender.  Bowel sounds are heard  extremities: Bilateral trace lower extremity edema.  No clubbing or cyanosis Central nervous system: Awake and alert.  No obvious focal deficits.  Moving extremities Skin: No lesions, ulcers or rashes Psychiatry: Extremely flat affect    Data Reviewed: I have personally reviewed following labs and imaging studies  CBC: Recent Labs  Lab 09/24/19 1525 09/25/19 0422 09/25/19 1045 09/25/19 1836 09/26/19 0438  WBC 9.3 8.4 8.8  --  8.6  NEUTROABS  --   --  6.6  --   --   HGB 6.2* 6.6* 8.7* 9.6* 8.8*  HCT 22.7* 23.7* 29.7* 32.4* 29.4*  MCV 74.9* 75.5* 79.0*  --  78.6*  PLT 165 182 171  --  169   Basic Metabolic Panel: Recent Labs  Lab 09/24/19 1525 09/25/19 0422 09/25/19 1045 09/26/19 0438  NA 134* 136 137 135  K 4.7 4.4 4.4 4.4  CL 104 106 107 106  CO2 22 21* 22 21*  GLUCOSE 107* 106* 96 96  BUN 20 20 19 16   CREATININE 2.02* 1.80* 1.81* 1.79*  CALCIUM 8.6* 8.7* 8.8* 8.8*  MG  --   --  2.2  --     GFR: Estimated Creatinine Clearance: 41.6 mL/min (A) (by C-G formula based on SCr of 1.79 mg/dL (H)). Liver Function Tests: Recent Labs  Lab 09/24/19 1525 09/25/19 1045  AST 14* 15  ALT 17 16  ALKPHOS 77 79  BILITOT 0.3 0.8  PROT 7.3 7.1  ALBUMIN 3.3* 3.4*   No results for input(s): LIPASE, AMYLASE in the last 168 hours. No results for input(s): AMMONIA in the last 168 hours. Coagulation Profile: No results for input(s): INR, PROTIME in the last 168 hours. Cardiac Enzymes: No results for input(s): CKTOTAL, CKMB, CKMBINDEX, TROPONINI in the last 168 hours. BNP (last 3 results) No results for input(s): PROBNP in the last 8760 hours. HbA1C: No results for input(s): HGBA1C in the last 72 hours. CBG: No results for input(s): GLUCAP in the last 168 hours. Lipid Profile: No results for input(s): CHOL, HDL, LDLCALC, TRIG, CHOLHDL, LDLDIRECT in the last 72 hours. Thyroid Function Tests: No results for input(s): TSH, T4TOTAL, FREET4, T3FREE, THYROIDAB in the last 72 hours. Anemia Panel: Recent Labs    09/25/19 0422  VITAMINB12 195  FERRITIN 9*  TIBC 409  IRON 21*   Sepsis Labs: No results for input(s): PROCALCITON, LATICACIDVEN in the last 168 hours.  Recent Results (from the past 240 hour(s))  SARS CORONAVIRUS 2 (TAT 6-24 HRS) Nasopharyngeal Nasopharyngeal Swab     Status: None   Collection Time: 09/24/19  4:24 PM   Specimen: Nasopharyngeal Swab  Result Value Ref Range Status   SARS Coronavirus 2 NEGATIVE NEGATIVE Final    Comment: (NOTE) SARS-CoV-2 target nucleic acids are NOT DETECTED. The SARS-CoV-2 RNA is generally detectable in upper and lower respiratory specimens during the acute phase of infection. Negative results do not preclude SARS-CoV-2 infection, do not rule out co-infections with other pathogens, and should not be used as the sole basis for treatment or other patient management decisions. Negative results must be combined with clinical  observations, patient history, and epidemiological information. The expected result is Negative. Fact Sheet for Patients: SugarRoll.be Fact Sheet for Healthcare Providers: https://www.woods-mathews.com/ This test is not yet approved or cleared by the [place] FDA and  has been authorized for detection and/or diagnosis of SARS-CoV-2 by FDA under an Emergency Use Authorization (EUA). This EUA will remain  in effect (meaning this test can be used) for the duration of the COVID-19 declaration under Section 56 4(b)(1) of the Act, 21 U.S.C. section 360bbb-3(b)(1), unless the authorization is terminated or revoked sooner. Performed at [facility], [place] [address], [place], [place] [address]          Radiology Studies: CT ABDOMEN PELVIS WO CONTRAST  Result Date: 09/25/2019 CLINICAL DATA:  Abdominal pain. Right flank pain. Anemia. History of renal cancer status post left oophorectomy. EXAM: CT ABDOMEN AND PELVIS WITHOUT CONTRAST TECHNIQUE: Multidetector  CT imaging of the abdomen and pelvis was performed following the standard protocol without IV contrast. COMPARISON:  04/27/2019 FINDINGS: Lower chest: There are emphysematous changes at the lung bases bilaterally.The heart size is normal. Hepatobiliary: The liver is normal. Normal gallbladder.There is no biliary ductal dilation. Pancreas: Normal contours without ductal dilatation. No peripancreatic fluid collection. Spleen: No splenic laceration or hematoma. Adrenals/Urinary Tract: --Adrenal glands: No adrenal hemorrhage. --Right kidney/ureter: No hydronephrosis or perinephric hematoma. --Left kidney/ureter: The patient is status post prior left nephrectomy. There is no convincing soft tissue within the left oophorectomy bed. --Urinary bladder: Unremarkable. Stomach/Bowel: --Stomach/Duodenum: No hiatal hernia or other gastric abnormality. Normal duodenal course and caliber. --Small bowel: No  dilatation or inflammation. --Colon: There is a large amount of stool in the patient's colon. There is scattered colonic diverticula without CT evidence for diverticulitis. --Appendix: Normal. Vascular/Lymphatic: Atherosclerotic calcification is present within the non-aneurysmal abdominal aorta, without hemodynamically significant stenosis. --No retroperitoneal lymphadenopathy. --No mesenteric lymphadenopathy. --No pelvic or inguinal lymphadenopathy. Reproductive: Prostate gland is significantly enlarged. Other: No ascites or free air. The abdominal wall is normal. Musculoskeletal. No acute displaced fractures. IMPRESSION: 1. Unremarkable appearance of the right kidney without evidence for hydronephrosis or radiopaque kidney stone. 2. Normal appendix. 3. There is a large amount of stool in the patient's colon. 4. Status post left nephrectomy. 5. Aortic Atherosclerosis (ICD10-I70.0). Electronically Signed   By: Constance Holster M.D.   On: 09/25/2019 19:24   DG Chest Portable 1 View  Result Date: 09/24/2019 CLINICAL DATA:  Low hemoglobin. Dark stools. Prior history of nephrectomy. COPD. EXAM: PORTABLE CHEST 1 VIEW COMPARISON:  Chest x-ray 04/13/2019 02/28/2019.  CT 09/26/2018. FINDINGS: Mediastinum and hilar structures normal. Heart size stable. Overlying EKG leads and tubing make evaluation difficult. Mild right mid lung field acute infiltrate cannot be excluded. Chronic interstitial changes and changes of COPD noted. No pleural effusion or pneumothorax. IMPRESSION: 1. Mild right mid lung infiltrate cannot be excluded. Overlying EKG leads and tubing make evaluation difficult. Follow-up exams to demonstrate clearing suggested. 2.  Chronic interstitial changes and changes of COPD again noted. Electronically Signed   By: Marcello Moores  Register   On: 09/24/2019 16:31        Scheduled Meds: . levofloxacin  500 mg Oral Daily  . loratadine  10 mg Oral Daily  . mometasone-formoterol  2 puff Inhalation BID  .  montelukast  10 mg Oral QHS  . pantoprazole (PROTONIX) IV  40 mg Intravenous Daily  . tamsulosin  0.4 mg Oral Q12H  . umeclidinium bromide  1 puff Inhalation Daily   Continuous Infusions: . sodium chloride Stopped (09/24/19 1618)  . lactated ringers 50 mL/hr at 09/25/19 1653          Levonte Molina Starla Link, MD [facility] 09/26/2019, 7:45 AM

## 2019-09-26 NOTE — H&P (View-Only) (Signed)
[facility] Progress Note  CC:  Anemia, melena   Subjective: He continues to have RUQ/flank pain, not severe. He passed a small hard brown stool this morning. No other complaints at this time.  Objective:   Abdominal/pelvic CT with oral contrast 09/25/2019: 1. Unremarkable appearance of the right kidney without evidence for hydronephrosis or radiopaque kidney stone. 2. Normal appendix. 3. There is a large amount of stool in the patient's colon. 4. Status post left nephrectomy. 5. Aortic Atherosclerosis    Vital signs in last 24 hours: Temp:  [98 F (36.7 C)-98.8 F (37.1 C)] 98 F (36.7 C) (03/26 2235) Pulse Rate:  [70-75] 73 (03/26 2235) Resp:  [13-21] 18 (03/26 2235) BP: (127-148)/(59-81) 127/59 (03/26 2235) SpO2:  [98 %-100 %] 98 % (03/26 2235) Last BM Date: 09/24/19 General:   Alert 69 year old male in NAD.  Heart: RRR, systolic murmur.  Pulm: Less expiratory wheezes today, diminished throughout. On O2 [place] 2L.   Abdomen: Protuberant, soft, moderate tenderness RUQ, right flank without rebound or guarding. Hypoactive BS x 4 quads. Stool in commode small amount of hard brown stool grossly heme +.  Extremities:  Without edema. Neurologic:  Alert and  oriented x4;  grossly normal neurologically. Psych:  Alert and cooperative. Normal mood and affect.  Intake/Output from previous day: 03/26 0701 - 03/27 0700 In: 2215.8 [P.O.:1620; I.V.:320.8; Blood:275] Out: 2725 [Urine:2725] Intake/Output this shift: Total I/O In: -  Out: 1500 [Urine:1500]  Lab Results: Recent Labs    09/25/19 0422 09/25/19 0422 09/25/19 1045 09/25/19 1836 09/26/19 0438  WBC 8.4  --  8.8  --  8.6  HGB 6.6*   < > 8.7* 9.6* 8.8*  HCT 23.7*   < > 29.7* 32.4* 29.4*  PLT 182  --  171  --  201   < > = values in this interval not displayed.   BMET Recent Labs    09/24/19 1525 09/25/19 0422 09/25/19 1045  NA 134* 136 137  K 4.7 4.4 4.4  CL 104 106 107  CO2 22 21* 22    GLUCOSE 107* 106* 96  BUN 20 20 19   CREATININE 2.02* 1.80* 1.81*  CALCIUM 8.6* 8.7* 8.8*   LFT Recent Labs    09/25/19 1045  PROT 7.1  ALBUMIN 3.4*  AST 15  ALT 16  ALKPHOS 79  BILITOT 0.8   PT/INR No results for input(s): LABPROT, INR in the last 72 hours. Hepatitis Panel No results for input(s): HEPBSAG, HCVAB, HEPAIGM, HEPBIGM in the last 72 hours.  CT ABDOMEN PELVIS WO CONTRAST  Result Date: 09/25/2019 CLINICAL DATA:  Abdominal pain. Right flank pain. Anemia. History of renal cancer status post left oophorectomy. EXAM: CT ABDOMEN AND PELVIS WITHOUT CONTRAST TECHNIQUE: Multidetector CT imaging of the abdomen and pelvis was performed following the standard protocol without IV contrast. COMPARISON:  04/27/2019 FINDINGS: Lower chest: There are emphysematous changes at the lung bases bilaterally.The heart size is normal. Hepatobiliary: The liver is normal. Normal gallbladder.There is no biliary ductal dilation. Pancreas: Normal contours without ductal dilatation. No peripancreatic fluid collection. Spleen: No splenic laceration or hematoma. Adrenals/Urinary Tract: --Adrenal glands: No adrenal hemorrhage. --Right kidney/ureter: No hydronephrosis or perinephric hematoma. --Left kidney/ureter: The patient is status post prior left nephrectomy. There is no convincing soft tissue within the left oophorectomy bed. --Urinary bladder: Unremarkable. Stomach/Bowel: --Stomach/Duodenum: No hiatal hernia or other gastric abnormality. Normal duodenal course and caliber. --Small bowel: No dilatation or inflammation. --Colon: There is a large amount  of stool in the patient's colon. There is scattered colonic diverticula without CT evidence for diverticulitis. --Appendix: Normal. Vascular/Lymphatic: Atherosclerotic calcification is present within the non-aneurysmal abdominal aorta, without hemodynamically significant stenosis. --No retroperitoneal lymphadenopathy. --No mesenteric lymphadenopathy. --No pelvic  or inguinal lymphadenopathy. Reproductive: Prostate gland is significantly enlarged. Other: No ascites or free air. The abdominal wall is normal. Musculoskeletal. No acute displaced fractures. IMPRESSION: 1. Unremarkable appearance of the right kidney without evidence for hydronephrosis or radiopaque kidney stone. 2. Normal appendix. 3. There is a large amount of stool in the patient's colon. 4. Status post left nephrectomy. 5. Aortic Atherosclerosis (ICD10-I70.0). Electronically Signed   By: Constance Holster M.D.   On: 09/25/2019 19:24   DG Chest Portable 1 View  Result Date: 09/24/2019 CLINICAL DATA:  Low hemoglobin. Dark stools. Prior history of nephrectomy. COPD. EXAM: PORTABLE CHEST 1 VIEW COMPARISON:  Chest x-ray 04/13/2019 02/28/2019.  CT 09/26/2018. FINDINGS: Mediastinum and hilar structures normal. Heart size stable. Overlying EKG leads and tubing make evaluation difficult. Mild right mid lung field acute infiltrate cannot be excluded. Chronic interstitial changes and changes of COPD noted. No pleural effusion or pneumothorax. IMPRESSION: 1. Mild right mid lung infiltrate cannot be excluded. Overlying EKG leads and tubing make evaluation difficult. Follow-up exams to demonstrate clearing suggested. 2.  Chronic interstitial changes and changes of COPD again noted. Electronically Signed   By: Marcello Moores  Register   On: 09/24/2019 16:31    Assessment / Plan:  31. 69 year old male presents to the ED with dizziness and melenic stool with profound IDA in the setting of active reflux symptoms with pill dysphagia taking Pepto-Bismol twice daily for the past month. Hemoglobin 6.2 down from his baseline of 10.2. Received 2 units of PRBCs 3/25. Today Hg 8.8. Iron 21. TIBC 409. Ferritin 9. Today solid brown stool grossly heme +.  -Hold Eliquis for now -EGD and colonoscopy Sunday 3/28, benefits and risks discussed including risk with sedation, risk of bleeding, infection and perforation.  -Clear liquid diet  then NPO after midnight. -Pantoprazole 40 mg IV every 24 hours.  -Recommend IV iron prior.  -Miralax dose this am.  2. RUQ/right flank pain. CTAP 3/26 showed a large amount of stool in the colon.  -See plan in # 1  3.History of DVT/PE 2019on Eliquis. -Hold Eliquis for now  4. Acute on chronic microcytic anemia -See plan in #1  5. AKI on CKD, improving Today Cr. 1.79.   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 Chronic scrotal edema. Seen by [facility] NP 3/26, continue Levaquin, Flomax.   7. COPD. On 2L [place].  Further recommendations per Dr. Henrene Pastor  Active Problems:   Symptomatic anemia   Iron deficiency anemia due to chronic blood loss   Melena   Abdominal pain, chronic, right lower quadrant   Chronic anticoagulation    LOS: 2 days   Gregory Christensen  09/26/2019, 5:15 AM  GI ATTENDING  Interval history data reviewed.  Patient personally seen and examined.  Agree with comprehensive interval progress note as outlined above.  Patient stable post transfusion.  Plans are for colonoscopy and upper endoscopy tomorrow off Eliquis to evaluate iron deficiency anemia.  The patient is HIGH RISK given his advanced comorbidities and the need for chronic anticoagulation therapy.  His procedure will be performed with monitored anesthesia care.The nature of the procedure, as well as the risks, benefits, and alternatives were carefully and thoroughly reviewed with the patient. Ample time for discussion  and questions allowed. The patient understood, was satisfied, and agreed to proceed.  Docia Chuck. Geri Seminole., M.D. [facility] Division of Gastroenterology

## 2019-09-26 NOTE — Evaluation (Signed)
Physical Therapy Evaluation Patient Details Name: Gregory Christensen MRN: [id number] DOB: Jan 17, 1951 Today's Date: 09/26/2019   History of Present Illness  Pt is 69 yo male with hx of COPD on 2 LPM O2 at home, L LE DVT and a PE , renal mass, and  R hydrocele s/p recent excision.  Pt admitted with anemia, hgb of 6.2, and probable GI bleeding.  Has received 2 units PRBC.  EGD and Colonscopy planned for 09/27/19.  Clinical Impression  Pt admitted with above diagnosis. Pt was able to ambulate 20' with supervision but fatigues easily.  He did have decrease in BP with mild symptoms in standing (see below for vitals.)  Pt requiring min cues for safety.  Pt currently with functional limitations due to the deficits listed below (see PT Problem List). Pt will benefit from skilled PT to increase their independence and safety with mobility to allow discharge to the venue listed below.       Follow Up Recommendations No PT follow up    Equipment Recommendations  None recommended by PT    Recommendations for Other Services       Precautions / Restrictions Precautions Precautions: Fall Precaution Comments: monitor for orthostatic hypotension      Mobility  Bed Mobility Overal bed mobility: Modified Independent                Transfers Overall transfer level: Needs assistance Equipment used: None Transfers: Sit to/from Stand Sit to Stand: Supervision         General transfer comment: no physical assist, supervision for safety  Ambulation/Gait Ambulation/Gait assistance: Min guard Gait Distance (Feet): 20 Feet Assistive device: None Gait Pattern/deviations: Decreased stride length;Trunk flexed Gait velocity: decreased   General Gait Details: fatigued easily; supervision for safety  Stairs            Wheelchair Mobility    Modified Rankin (Stroke Patients Only)       Balance Overall balance assessment: Needs assistance Sitting-balance support: No upper extremity  supported;Feet supported Sitting balance-Leahy Scale: Normal     Standing balance support: Single extremity supported;During functional activity Standing balance-Leahy Scale: Good                               Pertinent Vitals/Pain Pain Assessment: 0-10 Pain Score: 7  Pain Location: R back (states at kidney)- reports chronic Pain Descriptors / Indicators: Discomfort Pain Intervention(s): Monitored during session;Limited activity within patient's tolerance;Repositioned;Relaxation(reports feels like being constipated making back hurt)    Orthostatic BP Supine 162/88 and HR 83 Sit 158/85 and HR 81 Stand 133/74 and HR 101 (reports lightheaded)    Home Living Family/patient expects to be discharged to:: Private residence Living Arrangements: Parent(with 73 yo mother) Available Help at Discharge: Family;Available PRN/intermittently Type of Home: House Home Access: Stairs to enter Entrance Stairs-Rails: None Entrance Stairs-Number of Steps: 2 in front, 4 in back Home Layout: One level Home Equipment: Shower seat;Cane - single point;Walker - 2 wheels;Walker - 4 wheels;Bedside commode;Wheelchair - manual;Grab bars - tub/shower Additional Comments: home O2 at 2L     Prior Function           Comments: Reports independent with ADLs and IADLs; drives; uses a cane at time     Hand Dominance        Extremity/Trunk Assessment   Upper Extremity Assessment Upper Extremity Assessment: Overall WFL for tasks assessed    Lower Extremity Assessment Lower Extremity Assessment: Overall  WFL for tasks assessed    Cervical / Trunk Assessment Cervical / Trunk Assessment: Kyphotic  Communication   Communication: No difficulties  Cognition Arousal/Alertness: Awake/alert Behavior During Therapy: WFL for tasks assessed/performed Overall Cognitive Status: Within Functional Limits for tasks assessed                                        General Comments       Exercises     Assessment/Plan    PT Assessment Patient needs continued PT services  PT Problem List Decreased strength;Decreased mobility;Decreased activity tolerance;Cardiopulmonary status limiting activity;Decreased balance;Decreased knowledge of use of DME       PT Treatment Interventions DME instruction;Therapeutic activities;Gait training;Therapeutic exercise;Patient/family education;Stair training;Functional mobility training    PT Goals (Current goals can be found in the Care Plan section)  Acute Rehab PT Goals Patient Stated Goal: to feel better PT Goal Formulation: With patient Time For Goal Achievement: 10/10/19 Potential to Achieve Goals: Good    Frequency Min 3X/week   Barriers to discharge        Co-evaluation               AM-PAC PT "6 Clicks" Mobility  Outcome Measure Help needed turning from your back to your side while in a flat bed without using bedrails?: None Help needed moving from lying on your back to sitting on the side of a flat bed without using bedrails?: None Help needed moving to and from a bed to a chair (including a wheelchair)?: None Help needed standing up from a chair using your arms (e.g., wheelchair or bedside chair)?: None Help needed to walk in hospital room?: None Help needed climbing 3-5 steps with a railing? : A Little 6 Click Score: 23    End of Session Equipment Utilized During Treatment: Gait belt Activity Tolerance: Patient tolerated treatment well Patient left: in bed;with call bell/phone within reach((pt has been ambulating to bathroom independently)) Nurse Communication: Mobility status PT Visit Diagnosis: Other abnormalities of gait and mobility (R26.89)    Time: 9480-1655 PT Time Calculation (min) (ACUTE ONLY): 20 min   Charges:   PT Evaluation $PT Eval Low Complexity: 1 Low          Maggie Font, PT Acute Rehab Services Pager [phone] [facility] [phone] [facility]  [place] 09/26/2019, 4:34 PM

## 2019-09-27 ENCOUNTER — Encounter (HOSPITAL_COMMUNITY): Payer: Self-pay | Admitting: Internal Medicine

## 2019-09-27 ENCOUNTER — Inpatient Hospital Stay (HOSPITAL_COMMUNITY): Payer: Medicare HMO | Admitting: Certified Registered Nurse Anesthetist

## 2019-09-27 ENCOUNTER — Encounter (HOSPITAL_COMMUNITY)
Admission: EM | Disposition: A | Discharge status: Home / Self Care | Payer: Self-pay | Source: Home / Self Care | Attending: Internal Medicine

## 2019-09-27 DIAGNOSIS — D124 Benign neoplasm of descending colon: Secondary | ICD-10-CM

## 2019-09-27 DIAGNOSIS — K621 Rectal polyp: Secondary | ICD-10-CM

## 2019-09-27 DIAGNOSIS — D128 Benign neoplasm of rectum: Secondary | ICD-10-CM

## 2019-09-27 DIAGNOSIS — K635 Polyp of colon: Secondary | ICD-10-CM

## 2019-09-27 DIAGNOSIS — D123 Benign neoplasm of transverse colon: Secondary | ICD-10-CM

## 2019-09-27 HISTORY — PX: COLONOSCOPY WITH PROPOFOL: SHX5780

## 2019-09-27 HISTORY — PX: ESOPHAGOGASTRODUODENOSCOPY (EGD) WITH PROPOFOL: SHX5813

## 2019-09-27 HISTORY — PX: POLYPECTOMY: SHX5525

## 2019-09-27 LAB — TYPE AND SCREEN
ABO/RH(D): B POS
Antibody Screen: POSITIVE
Unit division: 0
Unit division: 0

## 2019-09-27 LAB — BPAM RBC
Blood Product Expiration Date: 202104192359
Blood Product Expiration Date: 202104242359
ISSUE DATE / TIME: 202103260314
ISSUE DATE / TIME: 202103260555
Unit Type and Rh: 7300
Unit Type and Rh: 7300

## 2019-09-27 LAB — BASIC METABOLIC PANEL
Anion gap: 11 (ref 5–15)
BUN: 11 mg/dL (ref 8–23)
CO2: 21 mmol/L — ABNORMAL LOW (ref 22–32)
Calcium: 9.4 mg/dL (ref 8.9–10.3)
Chloride: 107 mmol/L (ref 98–111)
Creatinine, Ser: 1.62 mg/dL — ABNORMAL HIGH (ref 0.61–1.24)
GFR calc Af Amer: 50 mL/min — ABNORMAL LOW (ref >60)
GFR calc non Af Amer: 43 mL/min — ABNORMAL LOW (ref >60)
Glucose, Bld: 90 mg/dL (ref 70–99)
Potassium: 4.1 mmol/L (ref 3.5–5.1)
Sodium: 139 mmol/L (ref 135–145)

## 2019-09-27 LAB — CBC
HCT: 31.6 % — ABNORMAL LOW (ref 39.0–52.0)
Hemoglobin: 9.4 g/dL — ABNORMAL LOW (ref 13.0–17.0)
MCH: 23.2 pg — ABNORMAL LOW (ref 26.0–34.0)
MCHC: 29.7 g/dL — ABNORMAL LOW (ref 30.0–36.0)
MCV: 78 fL — ABNORMAL LOW (ref 80.0–100.0)
Platelets: 242 10*3/uL (ref 150–400)
RBC: 4.05 MIL/uL — ABNORMAL LOW (ref 4.22–5.81)
RDW: 21.9 % — ABNORMAL HIGH (ref 11.5–15.5)
WBC: 8.1 10*3/uL (ref 4.0–10.5)
nRBC: 0 % (ref 0.0–0.2)

## 2019-09-27 LAB — MAGNESIUM: Magnesium: 1.9 mg/dL (ref 1.7–2.4)

## 2019-09-27 SURGERY — ESOPHAGOGASTRODUODENOSCOPY (EGD) WITH PROPOFOL
Anesthesia: Monitor Anesthesia Care

## 2019-09-27 MED ORDER — PHENYLEPHRINE 40 MCG/ML (10ML) SYRINGE FOR IV PUSH (FOR BLOOD PRESSURE SUPPORT)
PREFILLED_SYRINGE | INTRAVENOUS | Status: DC | PRN
  Administered 2019-09-27: 120 ug via INTRAVENOUS

## 2019-09-27 MED ORDER — PANTOPRAZOLE SODIUM 40 MG PO TBEC: 40.0000 mg | DELAYED_RELEASE_TABLET | Freq: Every day | ORAL | Status: DC

## 2019-09-27 MED ORDER — PROPOFOL 500 MG/50ML IV EMUL
INTRAVENOUS | Status: DC | PRN
  Administered 2019-09-27: 150 ug/kg/min via INTRAVENOUS

## 2019-09-27 MED ORDER — PROPOFOL 10 MG/ML IV BOLUS
INTRAVENOUS | Status: DC | PRN
  Administered 2019-09-27: 40 mg via INTRAVENOUS

## 2019-09-27 MED ORDER — APIXABAN 5 MG PO TABS
5.0000 mg | ORAL_TABLET | Freq: Two times a day (BID) | ORAL | Status: DC
  Administered 2019-09-27: 5 mg via ORAL
  Filled 2019-09-27: qty 1

## 2019-09-27 MED ORDER — PROPOFOL 500 MG/50ML IV EMUL
INTRAVENOUS | Status: AC
  Filled 2019-09-27: qty 50

## 2019-09-27 SURGICAL SUPPLY — 25 items

## 2019-09-27 NOTE — Op Note (Signed)
[facility] Patient Name: Gregory Christensen Procedure Date: 09/27/2019 MRN: [id number] Attending MD: Docia Chuck. Henrene Pastor , MD Date of Birth: 04-18-1951 CSN: [id number] Age: 69 Admit Type: Inpatient Procedure:                Upper GI endoscopy Indications:              Iron deficiency anemia, Heme positive stool Providers:                Docia Chuck. Henrene Pastor, MD, Carlyn Reichert, RN, Janeece Agee,                            Technician Referring MD:              Medicines:                Monitored Anesthesia Care Complications:            No immediate complications. Estimated Blood Loss:     Estimated blood loss: none. Procedure:                Pre-Anesthesia Assessment:                           - Prior to the procedure, a History and Physical                            was performed, and patient medications and                            allergies were reviewed. The patient's tolerance of                            previous anesthesia was also reviewed. The risks                            and benefits of the procedure and the sedation                            options and risks were discussed with the patient.                            All questions were answered, and informed consent                            was obtained. Prior Anticoagulants: The patient has                            taken Eliquis (apixaban), last dose was 3 days                            prior to procedure. ASA Grade Assessment: III - A                            patient with severe systemic disease. After  reviewing the risks and benefits, the patient was                            deemed in satisfactory condition to undergo the                            procedure.                           After obtaining informed consent, the endoscope was                            passed under direct vision. Throughout the                            procedure, the patient's blood pressure, pulse,  and                            oxygen saturations were monitored continuously. The                            GIF-H190 ([id number]) Olympus gastroscope was                            introduced through the mouth, and advanced to the                            second part of duodenum. The upper GI endoscopy was                            accomplished without difficulty. The patient                            tolerated the procedure well. Scope In: Scope Out: Findings:      The esophagus was normal.      The stomach was normal.      The examined duodenum was normal.      The cardia and gastric fundus were normal on retroflexion. Impression:               - Normal esophagus.                           - Normal stomach.                           - Normal examined duodenum.                           - No specimens collected. Moderate Sedation:      none Recommendation:           1. Resume previous diet                           2. Recommend daily oral iron supplement  3. Okay to resume oral anticoagulation therapy if                            medically important                           The procedure has been reviewed with the patient.                            He has been given a copy of his report                           GI will sign off. Please call for questions or                            problems. Thanks. Procedure Code(s):        --- Professional ---                           [id number], Esophagogastroduodenoscopy, flexible,                            transoral; diagnostic, including collection of                            specimen(s) by brushing or washing, when performed                            (separate procedure) Diagnosis Code(s):        --- Professional ---                           D50.9, Iron deficiency anemia, unspecified                           R19.5, Other fecal abnormalities CPT copyright 2019 American Medical Association. All rights  reserved. The codes documented in this report are preliminary and upon coder review may  be revised to meet current compliance requirements. Docia Chuck. Henrene Pastor, MD 09/27/2019 1:08:19 PM This report has been signed electronically. Number of Addenda: 0

## 2019-09-27 NOTE — Anesthesia Postprocedure Evaluation (Signed)
Anesthesia Post Note  Patient: Gregory Christensen  Procedure(s) Performed: ESOPHAGOGASTRODUODENOSCOPY (EGD) WITH PROPOFOL (N/A ) COLONOSCOPY WITH PROPOFOL (N/A ) POLYPECTOMY     Patient location during evaluation: Endoscopy Anesthesia Type: MAC Level of consciousness: sedated Vital Signs Assessment: post-procedure vital signs reviewed and stable Respiratory status: spontaneous breathing Cardiovascular status: stable Postop Assessment: no apparent nausea or vomiting Anesthetic complications: no    Last Vitals:  Vitals:   09/27/19 1312 09/27/19 1320  BP: 115/68 140/61  Pulse:  (!) 56  Resp:  18  Temp:    SpO2:  100%    Last Pain:  Vitals:   09/27/19 1320  TempSrc:   PainSc: 0-No pain   Pain Goal: Patients Stated Pain Goal: 0 (09/24/19 1836)                 Huston Foley

## 2019-09-27 NOTE — Interval H&P Note (Signed)
History and Physical Interval Note:  09/27/2019 11:58 AM  Gregory Christensen  has presented today for surgery, with the diagnosis of anemia, heme + stool.  The various methods of treatment have been discussed with the patient and family. After consideration of risks, benefits and other options for treatment, the patient has consented to  Procedure(s): ESOPHAGOGASTRODUODENOSCOPY (EGD) WITH PROPOFOL (N/A) COLONOSCOPY WITH PROPOFOL (N/A) as a surgical intervention.  The patient's history has been reviewed, patient examined, no change in status, stable for surgery.  I have reviewed the patient's chart and labs.  Questions were answered to the patient's satisfaction.     [name]

## 2019-09-27 NOTE — Progress Notes (Signed)
Occupational Therapy Treatment Patient Details Name: Gregory Christensen MRN: [id number] DOB: 11/16/1950 Today's Date: 09/27/2019    History of present illness Pt is 69 yo male with hx of COPD on 2 LPM O2 at home, L LE DVT and a PE , renal mass, and  R hydrocele s/p recent excision.  Pt admitted with anemia, hgb of 6.2, and probable GI bleeding.  Has received 2 units PRBC.  EGD and Colonscopy planned for 09/27/19.   OT comments  Pt tolerated session well, awake and alert, agreeable to session to address tolerance for ADL engagement at sink level. Pt presents with no complaints of dizziness during grooming task at sink in sitting with set up. While washing face pt removed O2 cannula which decreased O2 sats to 78%, pt instructed to return O2 increased to 97% at 2L O2.  Continue acute OT to address establised deficits. DC and freq remains the same.     Follow Up Recommendations  Home health OT;Other (comment)(vs no OT follow up pending progress)    Equipment Recommendations  None recommended by OT    Recommendations for Other Services      Precautions / Restrictions Precautions Precautions: Fall Precaution Comments: monitor for orthostatic hypotension Restrictions Weight Bearing Restrictions: No       Mobility Bed Mobility Overal bed mobility: Modified Independent                Transfers Overall transfer level: Needs assistance Equipment used: None Transfers: Sit to/from Stand Sit to Stand: Supervision         General transfer comment: no physical assist, supervision for safety    Balance Overall balance assessment: Needs assistance Sitting-balance support: No upper extremity supported;Feet supported Sitting balance-Leahy Scale: Normal     Standing balance support: Single extremity supported;During functional activity Standing balance-Leahy Scale: Good                             ADL either performed or assessed with clinical judgement   ADL Overall  ADL's : Needs assistance/impaired     Grooming: Set up;Sitting Grooming Details (indicate cue type and reason): Pt presented items for grooming (washing) at sink level with chair provided for safety, due to pt reports feeling dizzy when standing for long period of time.                  Toilet Transfer: Copy Details (indicate cue type and reason): Supervision for simulated toilet transfer to sink with BSC provided.          Functional mobility during ADLs: Supervision/safety General ADL Comments: Pt reports feeling dizzy when standing for extended period of time. No complaints of dizziness this session, however, pt seated for duration of grooming task at sink.     Vision       Perception     Praxis      Cognition Arousal/Alertness: Awake/alert Behavior During Therapy: WFL for tasks assessed/performed Overall Cognitive Status: Within Functional Limits for tasks assessed                                          Exercises     Shoulder Instructions       General Comments pt removed O2 cannula to wash face. 78% on RA, therapist provided O2 increased to 97% at 2L O2  Pertinent Vitals/ Pain       Pain Assessment: No/denies pain Pain Descriptors / Indicators: Discomfort Pain Intervention(s): Monitored during session  Home Living                                          Prior Functioning/Environment              Frequency  Min 2X/week        Progress Toward Goals  OT Goals(current goals can now be found in the care plan section)  Progress towards OT goals: Progressing toward goals  Acute Rehab OT Goals Patient Stated Goal: to feel better OT Goal Formulation: With patient Time For Goal Achievement: 10/09/19 Potential to Achieve Goals: Good ADL Goals Pt Will Perform Grooming: with modified independence;standing;sitting Pt Will Perform Upper Body Dressing: Independently;sitting Pt  Will Perform Lower Body Dressing: with modified independence;sit to/from stand;sitting/lateral leans Pt Will Transfer to Toilet: with modified independence;ambulating;regular height toilet(cane) Pt Will Perform Toileting - Clothing Manipulation and hygiene: with modified independence;sitting/lateral leans;sit to/from stand  Plan      Co-evaluation                 AM-PAC OT "6 Clicks" Daily Activity     Outcome Measure   Help from another person eating meals?: None Help from another person taking care of personal grooming?: A Little Help from another person toileting, which includes using toliet, bedpan, or urinal?: A Little Help from another person bathing (including washing, rinsing, drying)?: A Little Help from another person to put on and taking off regular upper body clothing?: A Little Help from another person to put on and taking off regular lower body clothing?: A Little 6 Click Score: 19    End of Session Equipment Utilized During Treatment: Oxygen  OT Visit Diagnosis: Other abnormalities of gait and mobility (R26.89)   Activity Tolerance Patient tolerated treatment well(orthostatic hypotension)   Patient Left in bed;with call bell/phone within reach   Nurse Communication          Time: [id number] OT Time Calculation (min): 13 min  Charges: OT General Charges $OT Visit: 1 Visit OT Treatments $Self Care/Home Management : 8-22 mins  Minus Breeding, MSOT, OTR/L  Supplemental Rehabilitation Services  [phone]    Marius Ditch 09/27/2019, 9:27 AM

## 2019-09-27 NOTE — Progress Notes (Signed)
Per patient, he had a couple of formed, hard stools yesterday in between Miralax doses (this RN witnessed 2 hard, medium/small sized BMs). Pt states he has had 10-15 liquid BMs since then, and BMs have been clear liquid since early AM.

## 2019-09-27 NOTE — Anesthesia Preprocedure Evaluation (Addendum)
Anesthesia Evaluation  Patient identified by MRN, date of birth, ID band Patient awake    Reviewed: Allergy & Precautions, H&P , NPO status , Patient's Chart, lab work & pertinent test results  Airway Mallampati: I       Dental  (+) Edentulous Upper, Poor Dentition   Pulmonary sleep apnea , COPD,  oxygen dependent, former smoker,    Pulmonary exam normal breath sounds clear to auscultation       Cardiovascular hypertension, Pt. on medications + CAD and + DVT  Normal cardiovascular exam Rhythm:regular Rate:Normal  Takes Eliquis   Neuro/Psych PSYCHIATRIC DISORDERS Anxiety    GI/Hepatic GERD  Medicated and Controlled,  Endo/Other    Renal/GU Renal InsufficiencyRenal disease  negative genitourinary   Musculoskeletal  (+) Arthritis ,   Abdominal Normal abdominal exam  (+)   Peds  Hematology  (+) Blood dyscrasia, anemia ,   Anesthesia Other Findings   Reproductive/Obstetrics                            Anesthesia Physical  Anesthesia Plan  ASA: III  Anesthesia Plan: MAC   Post-op Pain Management:    Induction:   PONV Risk Score and Plan: 1 and Propofol infusion  Airway Management Planned: Nasal Cannula, Natural Airway and Simple Face Mask  Additional Equipment: None  Intra-op Plan:   Post-operative Plan:   Informed Consent: I have reviewed the patients History and Physical, chart, labs and discussed the procedure including the risks, benefits and alternatives for the proposed anesthesia with the patient or authorized representative who has indicated his/her understanding and acceptance.       Plan Discussed with: CRNA  Anesthesia Plan Comments:         Anesthesia Quick Evaluation

## 2019-09-27 NOTE — Transfer of Care (Signed)
Immediate Anesthesia Transfer of Care Note  Patient: Gregory Christensen  Procedure(s) Performed: ESOPHAGOGASTRODUODENOSCOPY (EGD) WITH PROPOFOL (N/A ) COLONOSCOPY WITH PROPOFOL (N/A ) POLYPECTOMY  Patient Location: PACU  Anesthesia Type:MAC  Level of Consciousness: awake  Airway & Oxygen Therapy: Patient Spontanous Breathing and Patient connected to face mask  Post-op Assessment: Report given to RN and Post -op Vital signs reviewed and stable  Post vital signs: Reviewed and stable  Last Vitals:  Vitals Value Taken Time  BP    Temp    Pulse    Resp    SpO2      Last Pain:  Vitals:   09/27/19 1208  TempSrc: Oral  PainSc: 0-No pain      Patients Stated Pain Goal: 0 (28/20/81 3887)  Complications: No apparent anesthesia complications

## 2019-09-27 NOTE — Op Note (Signed)
[facility] Patient Name: Gregory Christensen Procedure Date: 09/27/2019 MRN: [id number] Attending MD: Docia Chuck. Henrene Pastor , MD Date of Birth: April 09, 1951 CSN: [id number] Age: 69 Admit Type: Inpatient Procedure:                Colonoscopy with cold snare x 3 and hot snare x 1                            polypectomies Indications:              Heme positive stool, Iron deficiency anemia Providers:                Docia Chuck. Henrene Pastor, MD, Carlyn Reichert, RN, Janeece Agee,                            Technician Referring MD:             [facility] hospitalist Medicines:                Monitored Anesthesia Care Complications:            No immediate complications. Estimated blood loss:                            None. Estimated Blood Loss:     Estimated blood loss: none. Procedure:                Pre-Anesthesia Assessment:                           - Prior to the procedure, a History and Physical                            was performed, and patient medications and                            allergies were reviewed. The patient's tolerance of                            previous anesthesia was also reviewed. The risks                            and benefits of the procedure and the sedation                            options and risks were discussed with the patient.                            All questions were answered, and informed consent                            was obtained. Prior Anticoagulants: The patient has                            taken Eliquis (apixaban), last dose was 3 days  prior to procedure. ASA Grade Assessment: III - A                            patient with severe systemic disease. After                            reviewing the risks and benefits, the patient was                            deemed in satisfactory condition to undergo the                            procedure.                           After obtaining informed consent, the colonoscope                          was passed under direct vision. Throughout the                            procedure, the patient's blood pressure, pulse, and                            oxygen saturations were monitored continuously. The                            [id number]-H190DL ([id number] Olympus pediatric colonscope                            was introduced through the anus and advanced to the                            the cecum, identified by appendiceal orifice and                            ileocecal valve. The ileocecal valve, appendiceal                            orifice, and rectum were photographed. The quality                            of the bowel preparation was good. The colonoscopy                            was performed without difficulty. The patient                            tolerated the procedure well. The bowel preparation                            used was MoviPrep via split dose instruction. Scope In: 12:30:20 PM Scope Out: 40:10:27 PM Scope Withdrawal Time: 0 hours 13 minutes 8 seconds  Total Procedure Duration: 0 hours 16 minutes  58 seconds  Findings:      A 12 mm polyp was found in the rectum. The polyp was pedunculated. The       polyp was removed with a hot snare. Resection and retrieval were       complete.      Three polyps were found in the descending colon and transverse colon.       The polyps were 3 to 4 mm in size. These polyps were removed with a cold       snare. Resection and retrieval were complete.      Many small and large-mouthed diverticula were found in the entire colon.      The exam was otherwise without abnormality on direct and retroflexion       views. Impression:               - One 12 mm polyp in the rectum, removed with a hot                            snare. Resected and retrieved.                           - Three 3 to 4 mm polyps in the descending colon                            and in the transverse colon, removed with a cold                             snare. Resected and retrieved.                           - Diverticulosis in the entire examined colon.                           - The examination was otherwise normal on direct                            and retroflexion views. Moderate Sedation:      none Recommendation:           - Repeat colonoscopy in 3 years for surveillance.                           - Resume Eliquis (apixaban) if medically necessary.                           - EGD today. Please see report.                           - Resume previous diet.                           - Await pathology results.                           I reviewed the findings with the patient. The  patient was provided a copy of this report for his                            records. Procedure Code(s):        --- Professional ---                           [phone], Colonoscopy, flexible; with removal of                            tumor(s), polyp(s), or other lesion(s) by snare                            technique Diagnosis Code(s):        --- Professional ---                           K62.1, Rectal polyp                           K63.5, Polyp of colon                           R19.5, Other fecal abnormalities                           D50.9, Iron deficiency anemia, unspecified                           K57.30, Diverticulosis of large intestine without                            perforation or abscess without bleeding CPT copyright 2019 American Medical Association. All rights reserved. The codes documented in this report are preliminary and upon coder review may  be revised to meet current compliance requirements. Docia Chuck. Henrene Pastor, MD 09/27/2019 12:57:36 PM This report has been signed electronically. Number of Addenda: 0

## 2019-09-27 NOTE — Progress Notes (Signed)
ANTICOAGULATION CONSULT NOTE - Initial Consult  Pharmacy Consult for Apixaban  Indication: history of DVT/PE  No Known Allergies  Patient Measurements: Height: 6\' 1"  (185.4 cm) Weight: 169 lb 1.6 oz (76.7 kg) IBW/kg (Calculated) : 79.9   Vital Signs: Temp: 98.7 F (37.1 C) (03/28 1342) Temp Source: Oral (03/28 1342) BP: 136/75 (03/28 1342) Pulse Rate: 55 (03/28 1342)  Labs: Recent Labs    09/25/19 1045 09/25/19 1045 09/25/19 1836 09/25/19 1836 09/26/19 0438 09/27/19 0351  HGB 8.7*   < > 9.6*   < > 8.8* 9.4*  HCT 29.7*   < > 32.4*  --  29.4* 31.6*  PLT 171  --   --   --  201 242  CREATININE 1.81*  --   --   --  1.79* 1.62*   < > = values in this interval not displayed.    Estimated Creatinine Clearance: 47.3 mL/min (A) (by C-G formula based on SCr of 1.62 mg/dL (H)).   Medical History: Past Medical History:  Diagnosis Date  . Acute deep vein thrombosis (DVT) of left lower extremity ([place])   . Acute hypoxemic respiratory failure ([place])   . Anxiety   . Aortic atherosclerosis ([place])   . Arthritis   . Benign prostatic hyperplasia 08/28/2018   UNSPECIFIED WHETHER LOWER URINARY TRACT SYMPTOMS PRESENT  . CAD (coronary artery disease)   . Chronic back pain   . Chronic hip pain   . COPD exacerbation ([place]) 10/18/2017  . Emphysema lung ([place])   . Essential hypertension   . GERD (gastroesophageal reflux disease)   . Gout   . Gross hematuria 09/08/2018  . History of tobacco abuse   . Hypoxia   . Pulmonary embolism ([place]) 05/31/2018  . Renal mass    CONCERNING FOR RENAL CELL CARCINOMA DR. Lovena Neighbours  . Respiratory distress   . Respiratory failure, acute-on-chronic ([place]) 08/03/2018  . Seasonal allergies   . Sleep apnea     Assessment: 68 y/oM on Apixaban PTA for history of DVT/PE who presented to [facility] ED on 09/24/2019 with dizziness, melena, and anemia with Hgb of 6.2. Transfused 2 units PRBCs. Apixaban held on admission. Patient underwent EGD and colonoscopy today.   EGD  findings: normal esophagus, normal stomach, normal examined duodenum.  Colonoscopy findings: 88mm polyp found in the rectum, removed with hot snare. Three 3-9mm polyps found in the descending colon and transverse colon, removed with cold snare. Many small and large-mouthed diverticula were found in the entire colon.  Per GI, okay to resume anticoagulation. Pharmacy asked to resume Apixaban.   SCr 1.62 with CrCl ~ 47 ml/min  CBC: Hgb improved to 9.4. Pltc WNL   Goal of Therapy:  Appropriate dose for indication Absence of bleeding Monitor platelets by anticoagulation protocol: Yes   Plan:  Resume PTA dose of Apixaban 5mg  PO BID Monitor CBC  Monitor closely for s/sx of bleeding  Pharmacy to sign off. Will monitor peripherally.    Gregory Christensen 09/27/2019,2:28 PM

## 2019-09-27 NOTE — Progress Notes (Signed)
Patient ID: Gregory Christensen, male   DOB: April 22, 1951, 69 y.o.   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  Patient had a colonoscopy done on 09/08/2019 at [facility], results unknown.  GI was consulted.  2 unit packed red cells were transfused.  Assessment & Plan:  Acute blood loss anemia Probable GI bleeding -Presented with hemoglobin of 6.2; baseline of 10.  Was on Eliquis for history of DVT and PE.  Eliquis on hold.  Patient had a recent colonoscopy in [place], results unknown. -Status post 2 units packed red cells transfusion since admission.  Hemoglobin 9.4 this morning. -GI following.  Currently on IV Protonix.  For EGD and colonoscopy today.  Monitor H&H.  Acute kidney injury on chronic kidney disease stage IIIa -Baseline creatinine around 1.6.  Presented with creatinine of 2.02.  Improving to 1.62 today.  DC IV fluids  History of DVT and PE -Eliquis on hold.  Chronic hypoxic respiratory failure COPD -On 2 L oxygen at home via nasal cannula chronically.  Currently stable.  Continue Dulera and as needed nebs.  Right scrotal incision from hydrocele -Continue oral antibiotics that the patient was on prior to presentation.  Outpatient follow-up with urology  DVT prophylaxis: SCDs.  Eliquis on hold. Code Status: Full Family Communication: Spoke to patient at bedside Disposition Plan: Plan for EGD/colonoscopy today.  Discharge home most likely in 1-2 days once cleared by GI.  Consultants: GI  Procedures: None  Antimicrobials: Outpatient Levaquin continued  Subjective: Patient seen and examined at bedside.  No worsening  shortness of breath, fever, abdominal pain.  No black or bloody stools reported overnight. objective: Vitals:   09/26/19 0916 09/26/19 1215 09/26/19 2050 09/27/19 0447  BP:  (!) 150/88  135/81  Pulse:  80  85  Resp:    18  Temp:  98.6 F (37 C)  98.7 F (37.1 C)  TempSrc:  Oral  Oral  SpO2: 100% 100% 98% 97%  Weight:      Height:        Intake/Output Summary (Last 24 hours) at 09/27/2019 0742 Last data filed at 09/26/2019 1758 Gross per 24 hour  Intake 600 ml  Output 1250 ml  Net -650 ml   Filed Weights   09/24/19 1845  Weight: 74.4 kg    Examination:  General exam: No distress.  Looks chronically ill.  Poor historian.  Currently on 2 L oxygen via nasal cannula. Respiratory system: Bilateral decreased breath sounds at bases with some scattered crackles  cardiovascular system: S1-S2 heard, rate controlled Gastrointestinal system: Abdomen is nondistended, soft and nontender.  Normal bowel sounds are heard  extremities: Bilateral trace lower extremity edema.  No clubbing or cyanosis     Data Reviewed: I have personally reviewed following labs and imaging studies  CBC: Recent Labs  Lab 09/24/19 1525 09/24/19 1525 09/25/19 0422 09/25/19 1045 09/25/19 1836 09/26/19 0438 09/27/19 0351  WBC 9.3  --  8.4 8.8  --  8.6 8.1  NEUTROABS  --   --   --  6.6  --   --   --   HGB 6.2*   < >  6.6* 8.7* 9.6* 8.8* 9.4*  HCT 22.7*   < > 23.7* 29.7* 32.4* 29.4* 31.6*  MCV 74.9*  --  75.5* 79.0*  --  78.6* 78.0*  PLT 165  --  182 171  --  201 242   < > = values in this interval not displayed.   Basic Metabolic Panel: Recent Labs  Lab 09/24/19 1525 09/25/19 0422 09/25/19 1045 09/26/19 0438 09/27/19 0351  NA 134* 136 137 135 139  K 4.7 4.4 4.4 4.4 4.1  CL 104 106 107 106 107  CO2 22 21* 22 21* 21*  GLUCOSE 107* 106* 96 96 90  BUN 20 20 19 16 11   CREATININE 2.02* 1.80* 1.81* 1.79* 1.62*  CALCIUM 8.6* 8.7* 8.8* 8.8* 9.4  MG  --   --  2.2  --  1.9   GFR: Estimated  Creatinine Clearance: 45.9 mL/min (A) (by C-G formula based on SCr of 1.62 mg/dL (H)). Liver Function Tests: Recent Labs  Lab 09/24/19 1525 09/25/19 1045  AST 14* 15  ALT 17 16  ALKPHOS 77 79  BILITOT 0.3 0.8  PROT 7.3 7.1  ALBUMIN 3.3* 3.4*   No results for input(s): LIPASE, AMYLASE in the last 168 hours. No results for input(s): AMMONIA in the last 168 hours. Coagulation Profile: No results for input(s): INR, PROTIME in the last 168 hours. Cardiac Enzymes: No results for input(s): CKTOTAL, CKMB, CKMBINDEX, TROPONINI in the last 168 hours. BNP (last 3 results) No results for input(s): PROBNP in the last 8760 hours. HbA1C: No results for input(s): HGBA1C in the last 72 hours. CBG: No results for input(s): GLUCAP in the last 168 hours. Lipid Profile: No results for input(s): CHOL, HDL, LDLCALC, TRIG, CHOLHDL, LDLDIRECT in the last 72 hours. Thyroid Function Tests: No results for input(s): TSH, T4TOTAL, FREET4, T3FREE, THYROIDAB in the last 72 hours. Anemia Panel: Recent Labs    09/25/19 0422  VITAMINB12 195  FERRITIN 9*  TIBC 409  IRON 21*   Sepsis Labs: No results for input(s): PROCALCITON, LATICACIDVEN in the last 168 hours.  Recent Results (from the past 240 hour(s))  SARS CORONAVIRUS 2 (TAT 6-24 HRS) Nasopharyngeal Nasopharyngeal Swab     Status: None   Collection Time: 09/24/19  4:24 PM   Specimen: Nasopharyngeal Swab  Result Value Ref Range Status   SARS Coronavirus 2 NEGATIVE NEGATIVE Final    Comment: (NOTE) SARS-CoV-2 target nucleic acids are NOT DETECTED. The SARS-CoV-2 RNA is generally detectable in upper and lower respiratory specimens during the acute phase of infection. Negative results do not preclude SARS-CoV-2 infection, do not rule out co-infections with other pathogens, and should not be used as the sole basis for treatment or other patient management decisions. Negative results must be combined with clinical observations, patient history, and  epidemiological information. The expected result is Negative. Fact Sheet for Patients: SugarRoll.be Fact Sheet for Healthcare Providers: https://www.woods-mathews.com/ This test is not yet approved or cleared by the [place] FDA and  has been authorized for detection and/or diagnosis of SARS-CoV-2 by FDA under an Emergency Use Authorization (EUA). This EUA will remain  in effect (meaning this test can be used) for the duration of the COVID-19 declaration under Section 56 4(b)(1) of the Act, 21 U.S.C. section 360bbb-3(b)(1), unless the authorization is terminated or revoked sooner. Performed at [facility], [place] [address], [place], [place] [address]          Radiology Studies: CT ABDOMEN PELVIS WO CONTRAST  Result Date: 09/25/2019  CLINICAL DATA:  Abdominal pain. Right flank pain. Anemia. History of renal cancer status post left oophorectomy. EXAM: CT ABDOMEN AND PELVIS WITHOUT CONTRAST TECHNIQUE: Multidetector CT imaging of the abdomen and pelvis was performed following the standard protocol without IV contrast. COMPARISON:  04/27/2019 FINDINGS: Lower chest: There are emphysematous changes at the lung bases bilaterally.The heart size is normal. Hepatobiliary: The liver is normal. Normal gallbladder.There is no biliary ductal dilation. Pancreas: Normal contours without ductal dilatation. No peripancreatic fluid collection. Spleen: No splenic laceration or hematoma. Adrenals/Urinary Tract: --Adrenal glands: No adrenal hemorrhage. --Right kidney/ureter: No hydronephrosis or perinephric hematoma. --Left kidney/ureter: The patient is status post prior left nephrectomy. There is no convincing soft tissue within the left oophorectomy bed. --Urinary bladder: Unremarkable. Stomach/Bowel: --Stomach/Duodenum: No hiatal hernia or other gastric abnormality. Normal duodenal course and caliber. --Small bowel: No dilatation or inflammation. --Colon: There  is a large amount of stool in the patient's colon. There is scattered colonic diverticula without CT evidence for diverticulitis. --Appendix: Normal. Vascular/Lymphatic: Atherosclerotic calcification is present within the non-aneurysmal abdominal aorta, without hemodynamically significant stenosis. --No retroperitoneal lymphadenopathy. --No mesenteric lymphadenopathy. --No pelvic or inguinal lymphadenopathy. Reproductive: Prostate gland is significantly enlarged. Other: No ascites or free air. The abdominal wall is normal. Musculoskeletal. No acute displaced fractures. IMPRESSION: 1. Unremarkable appearance of the right kidney without evidence for hydronephrosis or radiopaque kidney stone. 2. Normal appendix. 3. There is a large amount of stool in the patient's colon. 4. Status post left nephrectomy. 5. Aortic Atherosclerosis (ICD10-I70.0). Electronically Signed   By: Constance Holster M.D.   On: 09/25/2019 19:24        Scheduled Meds: . levofloxacin  500 mg Oral Daily  . loratadine  10 mg Oral Daily  . mometasone-formoterol  2 puff Inhalation BID  . montelukast  10 mg Oral QHS  . pantoprazole (PROTONIX) IV  40 mg Intravenous Daily  . tamsulosin  0.4 mg Oral Q12H  . umeclidinium bromide  1 puff Inhalation Daily   Continuous Infusions: . sodium chloride Stopped (09/24/19 1618)  . lactated ringers 50 mL/hr at 09/26/19 1525          Aline August, MD [facility] 09/27/2019, 7:42 AM

## 2019-09-28 ENCOUNTER — Encounter: Payer: Self-pay | Admitting: *Deleted

## 2019-09-28 LAB — CBC
HCT: 31.1 % — ABNORMAL LOW (ref 39.0–52.0)
Hemoglobin: 9.1 g/dL — ABNORMAL LOW (ref 13.0–17.0)
MCH: 23.3 pg — ABNORMAL LOW (ref 26.0–34.0)
MCHC: 29.3 g/dL — ABNORMAL LOW (ref 30.0–36.0)
MCV: 79.5 fL — ABNORMAL LOW (ref 80.0–100.0)
Platelets: 276 10*3/uL (ref 150–400)
RBC: 3.91 MIL/uL — ABNORMAL LOW (ref 4.22–5.81)
RDW: 22.1 % — ABNORMAL HIGH (ref 11.5–15.5)
WBC: 8.1 10*3/uL (ref 4.0–10.5)
nRBC: 0 % (ref 0.0–0.2)

## 2019-09-28 MED ORDER — FERROUS SULFATE 325 (65 FE) MG PO TABS: 325.0000 mg | ORAL_TABLET | Freq: Every day | ORAL | 0 refills | Status: DC

## 2019-09-28 MED ORDER — FERROUS SULFATE 325 (65 FE) MG PO TABS: 325.0000 mg | ORAL_TABLET | Freq: Every day | ORAL | Status: DC

## 2019-09-28 NOTE — Discharge Summary (Signed)
Discharge Summary  Gregory Christensen FHL:[id number] DOB: December 29, 1950  PCP: Leighton Ruff, MD  Admit date: 09/24/2019 Discharge date: 09/28/2019  Time spent: 35 minutes  Recommendations for Outpatient Follow-up:  1. Follow-up with GI 2. Follow-up with your PCP 3. Take your medications as prescribed.   Discharge Diagnoses:  Active Hospital Problems   Diagnosis Date Noted  . Benign neoplasm of transverse colon   . Benign neoplasm of descending colon   . Benign neoplasm of rectum   . Heme positive stool   . Iron deficiency anemia due to chronic blood loss   . Melena   . Abdominal pain, chronic, right lower quadrant   . Chronic anticoagulation   . Symptomatic anemia 09/24/2019    Resolved Hospital Problems  No resolved problems to display.    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  Patient had a colonoscopy done on 09/08/2019 at [facility], results unknown.  GI was consulted.  2 unit packed red cells were transfused.  Post EGD and colonoscopy 09/27/19.  EGD done on 09/27/2019 by Dr. Henrene Pastor: Normal esophagus. - Normal stomach. - Normal examined duodenum. - No specimens collected.  Colonoscopy done on 09/27/2019 by Dr. Henrene Pastor: One 12 mm polyp in the rectum, removed with a hot snaIrnep.a tRieenstected and retrieved. - Three 3 to 4 mm polyps in the descending colon and in the transverse colon, removed with a cold snare. Resected and retrieved. - Diverticulosis in the entire examined colon. - The  examination was otherwise normal on direct and retroflexion views. none Moderate Sedation: - Repeat colonoscopy in 3 years for surveillance. - Resume Eliquis (apixaban) if medically necessary. - EGD today. Please see report. - Resume previous diet. - Await pathology results. I reviewed the findings with the patient. The patient  09/28/19: Seen and examined.  Denies any overt bleeding.  Tolerating a diet well.  No abdominal pain or nausea.  No hematochezia or melena reported.  He wants to go home.  States he has an appointment at the [place] [facility] [place] Course:  Active Problems:   Symptomatic anemia   Iron deficiency anemia due to chronic blood loss   Melena   Abdominal pain, chronic, right lower quadrant   Chronic anticoagulation   Heme positive stool   Benign neoplasm of transverse colon   Benign neoplasm of descending colon   Benign neoplasm of rectum   Acute blood loss anemia Suspected GI source -Presented with hemoglobin of 6.2; baseline of 10.  Was on Eliquis for history of DVT and PE.   -Status post 2 units packed red cells transfusion since admission.   Hemoglobin 9.1 -No overt bleeding. -Post EGD/colonoscopy with above results. -Follow up with GI  Acute kidney injury on chronic kidney disease stage IIIa -Baseline creatinine around 1.6.  Presented with creatinine of 2.02.   Improving to 1.62.   Follow up with your PCP  History of DVT and PE -Continue Eliquis   Chronic hypoxic  respiratory failure COPD -On 2 L oxygen at home via nasal cannula chronically.  Currently stable.   Continue Dulera and as needed nebs.  Right scrotal incision from hydrocele -Continue oral antibiotics that the patient was on prior to presentation.   Outpatient follow-up with urology   Code Status: Full   Consultants: GI  Procedures: EGD/Colonoscopy 09/27/19.  Antimicrobials: Outpatient Levaquin continued    Discharge Exam: BP 113/70 (BP Location: Right Arm)   Pulse  87   Temp 98.4 F (36.9 C) (Oral)   Resp 18   Ht 6\' 1"  (1.854 m)   Wt 76.7 kg   SpO2 100%   BMI 22.31 kg/m  . General: 69 y.o. year-old male well developed well nourished in no acute distress.  Alert and oriented x3. . Cardiovascular: Regular rate and rhythm with no rubs or gallops.  No thyromegaly or JVD noted.   Marland Kitchen Respiratory: Clear to auscultation with no wheezes or rales. Good inspiratory effort. . Abdomen: Soft nontender nondistended with normal bowel sounds x4 quadrants. . Musculoskeletal: No lower extremity edema. 2/4 pulses in all 4 extremities. Marland Kitchen Psychiatry: Mood is appropriate for condition and setting  Discharge Instructions You were cared for by a hospitalist during your hospital stay. If you have any questions about your discharge medications or the care you received while you were in the hospital after you are discharged, you can call the unit and asked to speak with the hospitalist on call if the hospitalist that took care of you is not available. Once you are discharged, your primary care physician will handle any further medical issues. Please note that NO REFILLS for any discharge medications will be authorized once you are discharged, as it is imperative that you return to your primary care physician (or establish a relationship with a primary care physician if you do not have one) for your aftercare needs so that they can reassess your need for medications and monitor your lab values.   Allergies as of 09/28/2019   No Known Allergies     Medication List    STOP taking these medications   docusate sodium 100 MG capsule Commonly known as: COLACE   HYDROcodone-acetaminophen 5-325 MG tablet Commonly known as: NORCO/VICODIN   predniSONE 5 MG tablet Commonly known as: DELTASONE   sulfamethoxazole-trimethoprim 800-160 MG tablet Commonly known as: BACTRIM DS     TAKE these medications   acetaminophen 500 MG tablet Commonly known as: TYLENOL Take 1,000 mg by mouth  every 6 (six) hours as needed for moderate pain or headache.   albuterol 108 (90 Base) MCG/ACT inhaler Commonly known as: VENTOLIN HFA Inhale 2 puffs into the lungs every 2 (two) hours as needed for wheezing or shortness of breath.   albuterol (2.5 MG/3ML) 0.083% nebulizer solution Commonly known as: PROVENTIL Take 3 mLs (2.5 mg total) by nebulization every 6 (six) hours as needed for wheezing or shortness of breath.   amLODipine 2.5 MG tablet Commonly known as: NORVASC Take 2.5 mg by mouth daily.   apixaban 5 MG Tabs tablet Commonly known as: Eliquis Take 1 tablet (5 mg total) by mouth 2 (two) times daily.   budesonide-formoterol 160-4.5 MCG/ACT inhaler Commonly known as: SYMBICORT Inhale 1 puff into the lungs 2 (two) times daily.   famotidine 20 MG tablet Commonly known as: PEPCID Take 20 mg by mouth 2 (two) times daily.   ferrous sulfate 325 (65 FE) MG tablet Take 1 tablet (325 mg total) by mouth daily with breakfast.   fluticasone  50 MCG/ACT nasal spray Commonly known as: FLONASE Place 1 spray into both nostrils 2 (two) times daily. What changed:   when to take this  reasons to take this   gabapentin 400 MG capsule Commonly known as: NEURONTIN Take 400 mg by mouth 2 (two) times daily.   hydroxypropyl methylcellulose / hypromellose 2.5 % ophthalmic solution Commonly known as: ISOPTO TEARS / GONIOVISC Place 1 drop into both eyes 3 (three) times daily as needed for dry eyes.   levofloxacin 500 MG tablet Commonly known as: LEVAQUIN Take 500 mg by mouth daily.   loratadine 10 MG tablet Commonly known as: CLARITIN Take 10 mg by mouth daily.   montelukast 10 MG tablet Commonly known as: SINGULAIR Take 10 mg by mouth at bedtime.   pantoprazole 40 MG tablet Commonly known as: PROTONIX Take 40 mg by mouth daily.   Spiriva Respimat 1.25 MCG/ACT Aers Generic drug: Tiotropium Bromide Monohydrate Inhale 2 puffs into the lungs daily.   tamsulosin 0.4 MG Caps  capsule Commonly known as: FLOMAX Take 0.4 mg by mouth every 12 (twelve) hours.   traMADol 50 MG tablet Commonly known as: ULTRAM Take 50 mg by mouth every 4 (four) hours as needed for moderate pain.      No Known Allergies    The results of significant diagnostics from this hospitalization (including imaging, microbiology, ancillary and laboratory) are listed below for reference.    Significant Diagnostic Studies: CT ABDOMEN PELVIS WO CONTRAST  Result Date: 09/25/2019 CLINICAL DATA:  Abdominal pain. Right flank pain. Anemia. History of renal cancer status post left oophorectomy. EXAM: CT ABDOMEN AND PELVIS WITHOUT CONTRAST TECHNIQUE: Multidetector CT imaging of the abdomen and pelvis was performed following the standard protocol without IV contrast. COMPARISON:  04/27/2019 FINDINGS: Lower chest: There are emphysematous changes at the lung bases bilaterally.The heart size is normal. Hepatobiliary: The liver is normal. Normal gallbladder.There is no biliary ductal dilation. Pancreas: Normal contours without ductal dilatation. No peripancreatic fluid collection. Spleen: No splenic laceration or hematoma. Adrenals/Urinary Tract: --Adrenal glands: No adrenal hemorrhage. --Right kidney/ureter: No hydronephrosis or perinephric hematoma. --Left kidney/ureter: The patient is status post prior left nephrectomy. There is no convincing soft tissue within the left oophorectomy bed. --Urinary bladder: Unremarkable. Stomach/Bowel: --Stomach/Duodenum: No hiatal hernia or other gastric abnormality. Normal duodenal course and caliber. --Small bowel: No dilatation or inflammation. --Colon: There is a large amount of stool in the patient's colon. There is scattered colonic diverticula without CT evidence for diverticulitis. --Appendix: Normal. Vascular/Lymphatic: Atherosclerotic calcification is present within the non-aneurysmal abdominal aorta, without hemodynamically significant stenosis. --No retroperitoneal  lymphadenopathy. --No mesenteric lymphadenopathy. --No pelvic or inguinal lymphadenopathy. Reproductive: Prostate gland is significantly enlarged. Other: No ascites or free air. The abdominal wall is normal. Musculoskeletal. No acute displaced fractures. IMPRESSION: 1. Unremarkable appearance of the right kidney without evidence for hydronephrosis or radiopaque kidney stone. 2. Normal appendix. 3. There is a large amount of stool in the patient's colon. 4. Status post left nephrectomy. 5. Aortic Atherosclerosis (ICD10-I70.0). Electronically Signed   By: Constance Holster M.D.   On: 09/25/2019 19:24   DG Chest Portable 1 View  Result Date: 09/24/2019 CLINICAL DATA:  Low hemoglobin. Dark stools. Prior history of nephrectomy. COPD. EXAM: PORTABLE CHEST 1 VIEW COMPARISON:  Chest x-ray 04/13/2019 02/28/2019.  CT 09/26/2018. FINDINGS: Mediastinum and hilar structures normal. Heart size stable. Overlying EKG leads and tubing make evaluation difficult. Mild right mid lung field acute infiltrate cannot be excluded. Chronic interstitial changes and changes of COPD  noted. No pleural effusion or pneumothorax. IMPRESSION: 1. Mild right mid lung infiltrate cannot be excluded. Overlying EKG leads and tubing make evaluation difficult. Follow-up exams to demonstrate clearing suggested. 2.  Chronic interstitial changes and changes of COPD again noted. Electronically Signed   By: Marcello Moores  Register   On: 09/24/2019 16:31    Microbiology: Recent Results (from the past 240 hour(s))  SARS CORONAVIRUS 2 (TAT 6-24 HRS) Nasopharyngeal Nasopharyngeal Swab     Status: None   Collection Time: 09/24/19  4:24 PM   Specimen: Nasopharyngeal Swab  Result Value Ref Range Status   SARS Coronavirus 2 NEGATIVE NEGATIVE Final    Comment: (NOTE) SARS-CoV-2 target nucleic acids are NOT DETECTED. The SARS-CoV-2 RNA is generally detectable in upper and lower respiratory specimens during the acute phase of infection. Negative results do not  preclude SARS-CoV-2 infection, do not rule out co-infections with other pathogens, and should not be used as the sole basis for treatment or other patient management decisions. Negative results must be combined with clinical observations, patient history, and epidemiological information. The expected result is Negative. Fact Sheet for Patients: SugarRoll.be Fact Sheet for Healthcare Providers: https://www.woods-mathews.com/ This test is not yet approved or cleared by the [place] FDA and  has been authorized for detection and/or diagnosis of SARS-CoV-2 by FDA under an Emergency Use Authorization (EUA). This EUA will remain  in effect (meaning this test can be used) for the duration of the COVID-19 declaration under Section 56 4(b)(1) of the Act, 21 U.S.C. section 360bbb-3(b)(1), unless the authorization is terminated or revoked sooner. Performed at [facility], [place] [address] [place], [place] [address]      Labs: Basic Metabolic Panel: Recent Labs  Lab 09/24/19 1525 09/25/19 0422 09/25/19 1045 09/26/19 0438 09/27/19 0351  NA 134* 136 137 135 139  K 4.7 4.4 4.4 4.4 4.1  CL 104 106 107 106 107  CO2 22 21* 22 21* 21*  GLUCOSE 107* 106* 96 96 90  BUN 20 20 19 16 11   CREATININE 2.02* 1.80* 1.81* 1.79* 1.62*  CALCIUM 8.6* 8.7* 8.8* 8.8* 9.4  MG  --   --  2.2  --  1.9   Liver Function Tests: Recent Labs  Lab 09/24/19 1525 09/25/19 1045  AST 14* 15  ALT 17 16  ALKPHOS 77 79  BILITOT 0.3 0.8  PROT 7.3 7.1  ALBUMIN 3.3* 3.4*   No results for input(s): LIPASE, AMYLASE in the last 168 hours. No results for input(s): AMMONIA in the last 168 hours. CBC: Recent Labs  Lab 09/25/19 0422 09/25/19 0422 09/25/19 1045 09/25/19 1836 09/26/19 0438 09/27/19 0351 09/28/19 0414  WBC 8.4  --  8.8  --  8.6 8.1 8.1  NEUTROABS  --   --  6.6  --   --   --   --   HGB 6.6*   < > 8.7* 9.6* 8.8* 9.4* 9.1*  HCT 23.7*   < > 29.7*  32.4* 29.4* 31.6* 31.1*  MCV 75.5*  --  79.0*  --  78.6* 78.0* 79.5*  PLT 182  --  171  --  201 242 276   < > = values in this interval not displayed.   Cardiac Enzymes: No results for input(s): CKTOTAL, CKMB, CKMBINDEX, TROPONINI in the last 168 hours. BNP: BNP (last 3 results) Recent Labs    03/02/19 0245 03/03/19 0330 03/04/19 0450  BNP 33.9 35.9 21.0    ProBNP (last 3 results) No results for input(s): PROBNP  in the last 8760 hours.  CBG: No results for input(s): GLUCAP in the last 168 hours.     Signed:  Kayleen Memos, MD [facility] 09/28/2019, 3:30 PM

## 2019-09-29 ENCOUNTER — Telehealth (HOSPITAL_COMMUNITY): Payer: Self-pay | Admitting: *Deleted

## 2019-09-29 ENCOUNTER — Encounter (HOSPITAL_COMMUNITY): Payer: Self-pay | Admitting: *Deleted

## 2019-09-29 DIAGNOSIS — N183 Chronic kidney disease, stage 3 unspecified: Secondary | ICD-10-CM | POA: Diagnosis not present

## 2019-09-29 DIAGNOSIS — I129 Hypertensive chronic kidney disease with stage 1 through stage 4 chronic kidney disease, or unspecified chronic kidney disease: Secondary | ICD-10-CM | POA: Diagnosis not present

## 2019-09-29 LAB — FOLATE RBC
Folate, Hemolysate: 263 ng/mL
Folate, RBC: 1174 ng/mL (ref >498)
Hematocrit: 22.4 % — ABNORMAL LOW (ref 37.5–51.0)

## 2019-09-29 LAB — SURGICAL PATHOLOGY

## 2019-09-29 NOTE — Progress Notes (Signed)
Received via fax [place] authorization [phone] for this pt to participate in Pulmonary Rehab from Dr. Gwenette Greet.  Reviewed accompanying documents.  Pt seen by PCP on 2/8.  I do not see any Pulmonary related documents.  Called [facility] staff requesting recent follow up clinic notes from Dr. Gwenette Greet at the [facility] pulmonary clinic. Will fax this information to me for review.  Most recent PFT is from 2016.  Review medical history in epic.  Pt with recent hospitalization 3/25-3/29  for low Hgb requiring 2 units of PRBC.  Pt underwent endoscopy and colonoscopy.  Pt is to follow up with PCP and GI md.  Unaware if pt has any [facility] upcoming appt. Left message letting the community care coordinator Manuela Neptune regarding delay in scheduling pulmonary rehab until follow up have been completed and pt lab work is stable. Cherre Huger, BSN Cardiac and Training and development officer

## 2019-09-29 NOTE — Telephone Encounter (Signed)
Called and spoke to pt regarding [facility] authorization received for him to participate in pulmonary rehab. Asked about pt general well being. Pt stated that he felt pretty good.  No dark or bright red stools.  Pt discharged with no additional therapies warranted.  Advised pt that he will need to compete a follow up appt with his Primary MD - Dr. Gilman [facility] at [facility] on 4/6 at 11:00.  Await the satisfactory completion of this app for readiness to schedule pulmonary rehab.  Pt verbalized understanding and thanked me for the call.Cherre Huger, BSN Cardiac and Training and development officer

## 2019-10-06 DIAGNOSIS — Z7901 Long term (current) use of anticoagulants: Secondary | ICD-10-CM | POA: Diagnosis not present

## 2019-10-06 DIAGNOSIS — R0902 Hypoxemia: Secondary | ICD-10-CM | POA: Diagnosis not present

## 2019-10-06 DIAGNOSIS — N183 Chronic kidney disease, stage 3 unspecified: Secondary | ICD-10-CM | POA: Diagnosis not present

## 2019-10-06 DIAGNOSIS — Z85528 Personal history of other malignant neoplasm of kidney: Secondary | ICD-10-CM | POA: Diagnosis not present

## 2019-10-06 DIAGNOSIS — D649 Anemia, unspecified: Secondary | ICD-10-CM | POA: Diagnosis not present

## 2019-10-08 ENCOUNTER — Telehealth (HOSPITAL_COMMUNITY): Payer: Self-pay

## 2019-10-08 DIAGNOSIS — C652 Malignant neoplasm of left renal pelvis: Secondary | ICD-10-CM | POA: Diagnosis not present

## 2019-10-08 NOTE — Telephone Encounter (Signed)
Called the [facility] to let them know pt declined the Pulmonary rehab program and we will be closing his referral.

## 2019-10-12 DIAGNOSIS — C659 Malignant neoplasm of unspecified renal pelvis: Secondary | ICD-10-CM | POA: Diagnosis not present

## 2019-10-12 DIAGNOSIS — C652 Malignant neoplasm of left renal pelvis: Secondary | ICD-10-CM | POA: Diagnosis not present

## 2019-10-15 DIAGNOSIS — R972 Elevated prostate specific antigen [PSA]: Secondary | ICD-10-CM | POA: Diagnosis not present

## 2019-10-15 DIAGNOSIS — N401 Enlarged prostate with lower urinary tract symptoms: Secondary | ICD-10-CM | POA: Diagnosis not present

## 2019-10-15 DIAGNOSIS — R8279 Other abnormal findings on microbiological examination of urine: Secondary | ICD-10-CM | POA: Diagnosis not present

## 2019-10-15 DIAGNOSIS — C652 Malignant neoplasm of left renal pelvis: Secondary | ICD-10-CM | POA: Diagnosis not present

## 2019-10-15 DIAGNOSIS — R3915 Urgency of urination: Secondary | ICD-10-CM | POA: Diagnosis not present

## 2019-10-15 DIAGNOSIS — N453 Epididymo-orchitis: Secondary | ICD-10-CM | POA: Diagnosis not present

## 2019-10-21 DIAGNOSIS — D649 Anemia, unspecified: Secondary | ICD-10-CM | POA: Diagnosis not present

## 2019-10-26 DIAGNOSIS — N183 Chronic kidney disease, stage 3 unspecified: Secondary | ICD-10-CM | POA: Diagnosis not present

## 2019-10-26 DIAGNOSIS — I2510 Atherosclerotic heart disease of native coronary artery without angina pectoris: Secondary | ICD-10-CM | POA: Diagnosis not present

## 2019-10-26 DIAGNOSIS — N400 Enlarged prostate without lower urinary tract symptoms: Secondary | ICD-10-CM | POA: Diagnosis not present

## 2019-10-26 DIAGNOSIS — J449 Chronic obstructive pulmonary disease, unspecified: Secondary | ICD-10-CM | POA: Diagnosis not present

## 2019-10-26 DIAGNOSIS — I10 Essential (primary) hypertension: Secondary | ICD-10-CM | POA: Diagnosis not present

## 2019-10-26 DIAGNOSIS — E7800 Pure hypercholesterolemia, unspecified: Secondary | ICD-10-CM | POA: Diagnosis not present

## 2019-10-26 DIAGNOSIS — M1990 Unspecified osteoarthritis, unspecified site: Secondary | ICD-10-CM | POA: Diagnosis not present

## 2019-10-31 DIAGNOSIS — J441 Chronic obstructive pulmonary disease with (acute) exacerbation: Secondary | ICD-10-CM | POA: Diagnosis not present

## 2019-11-28 IMAGING — US ULTRASOUND SCROTUM DOPPLER COMPLETE
1 series · 13 of 25 positions shown · non-contrast
Comparison: CT scan abdomen/pelvis 02/19/2019. Scrotal ultrasound
02/19/2019.

CLINICAL DATA: Left testicular pain for 4 days.

EXAM:
SCROTAL ULTRASOUND
DOPPLER ULTRASOUND OF THE TESTICLES
TECHNIQUE: Complete ultrasound examination of the testicles, epididymis, and
other scrotal structures was performed. Color and spectral Doppler
ultrasound were also utilized to evaluate blood flow to the
testicles.

[Series 1: ultrasound scrotum doppler complete · 13 of 50 slices shown]
[im 1/50]
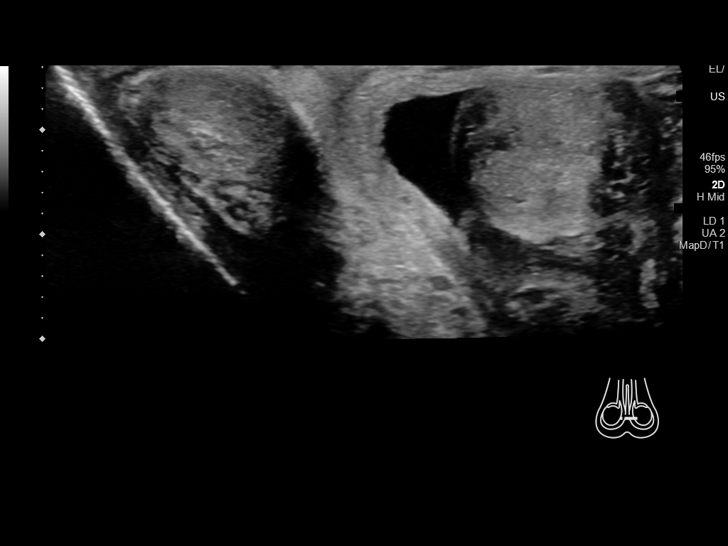
[im 5/50]
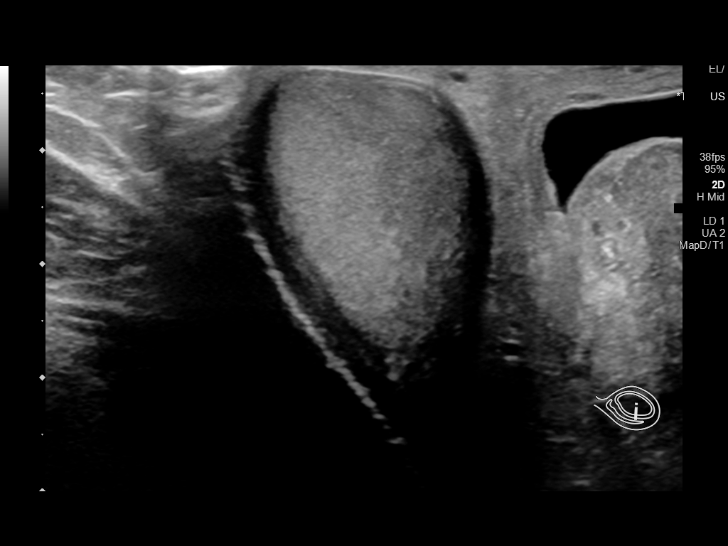
[im 9/50]
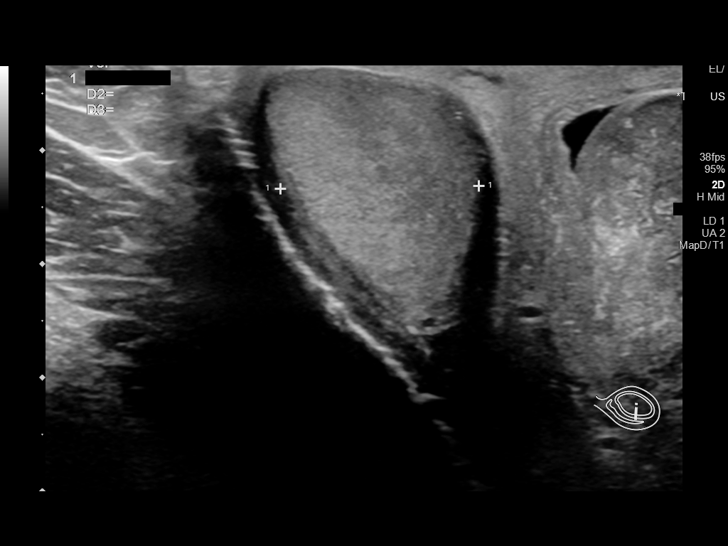
[im 13/50]
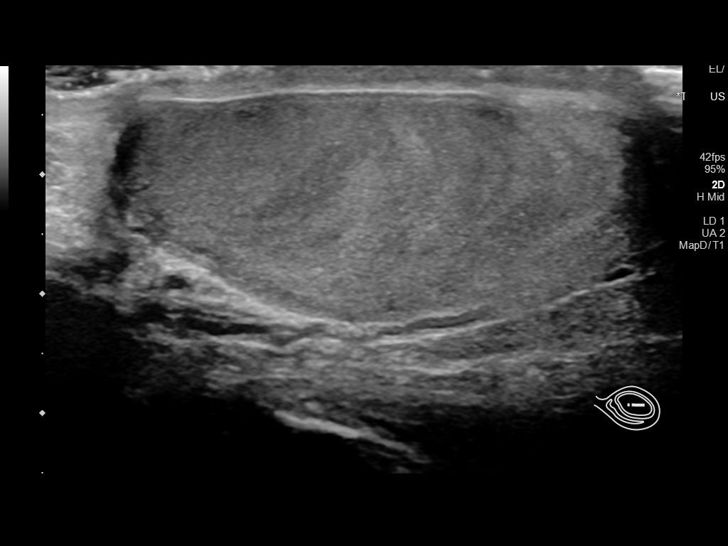
[im 17/50]
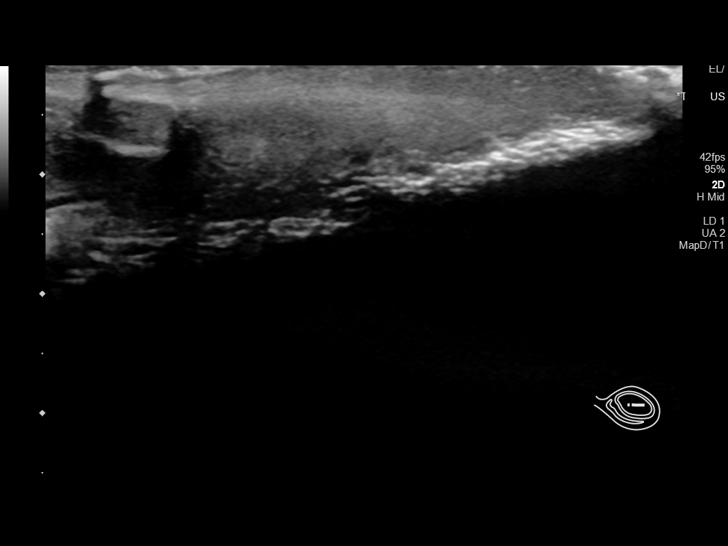
[im 21/50]
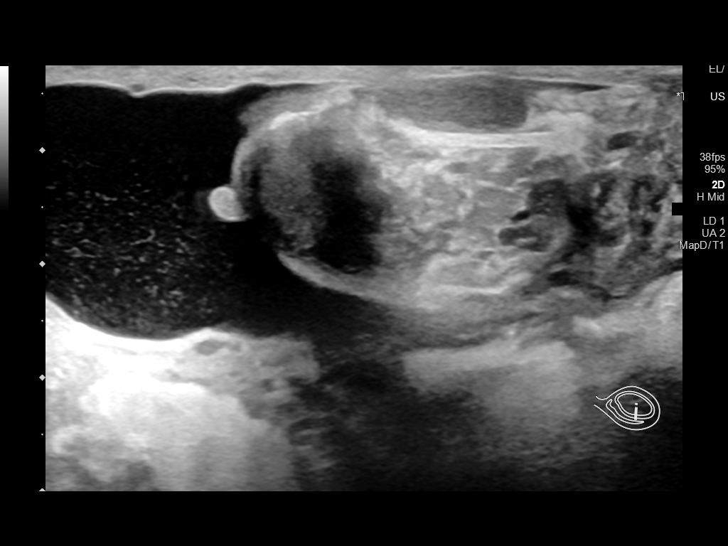
[im 25/50]
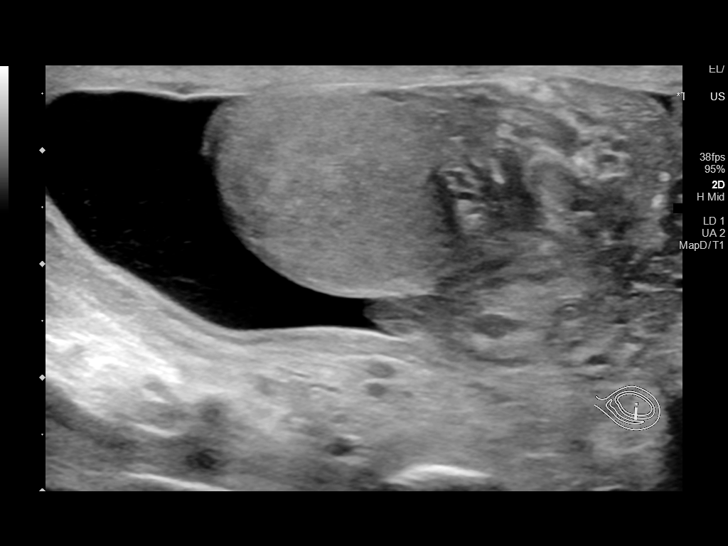
[im 29/50]
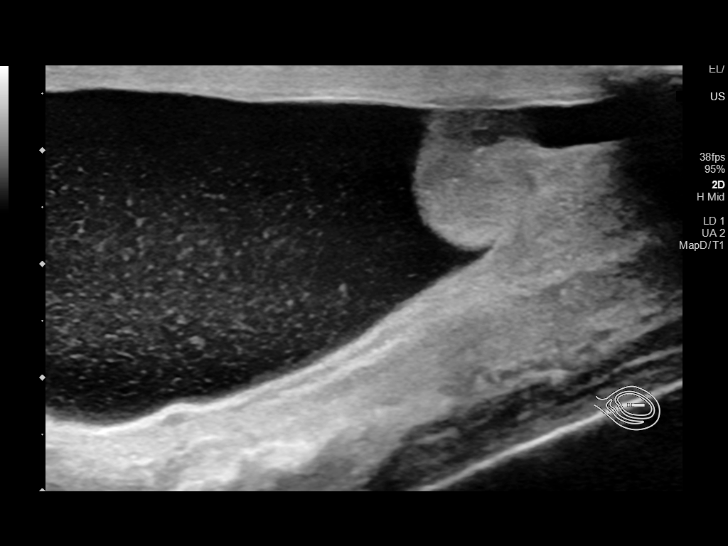
[im 33/50]
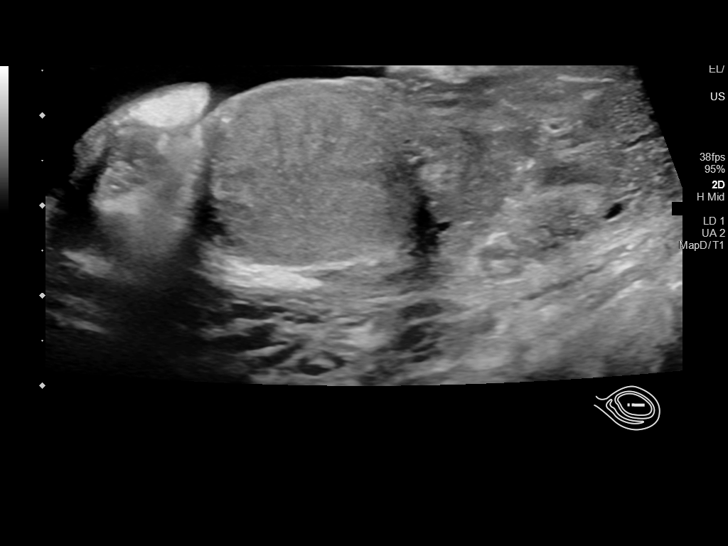
[im 37/50]
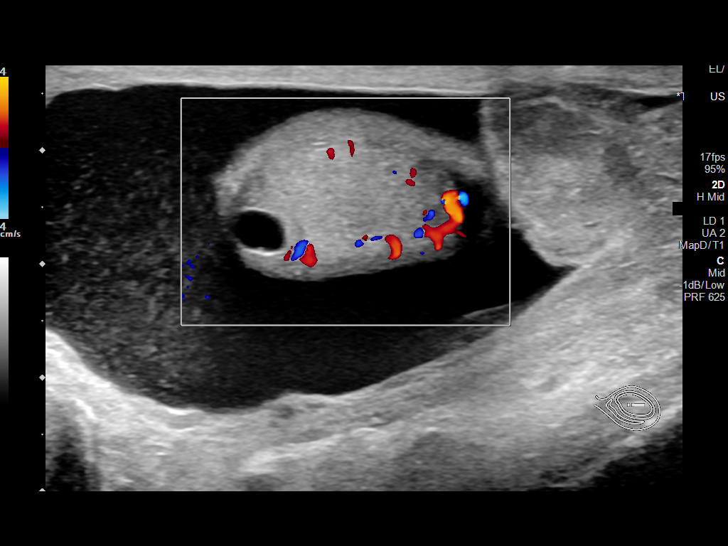
[im 41/50]
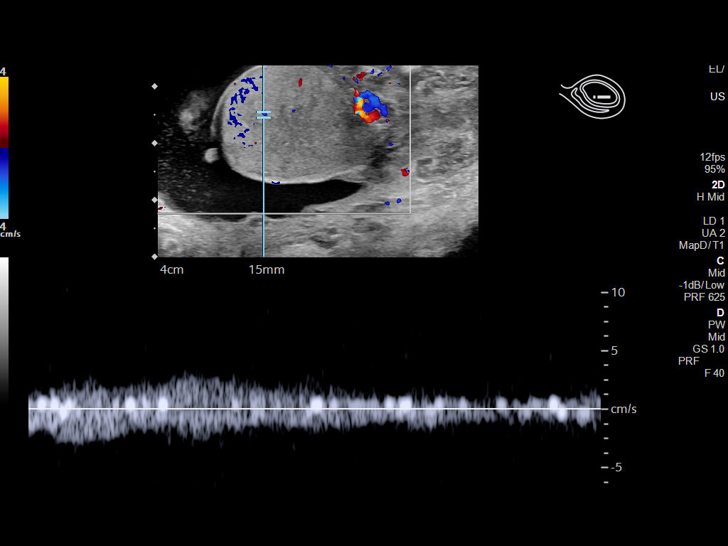
[im 45/50]
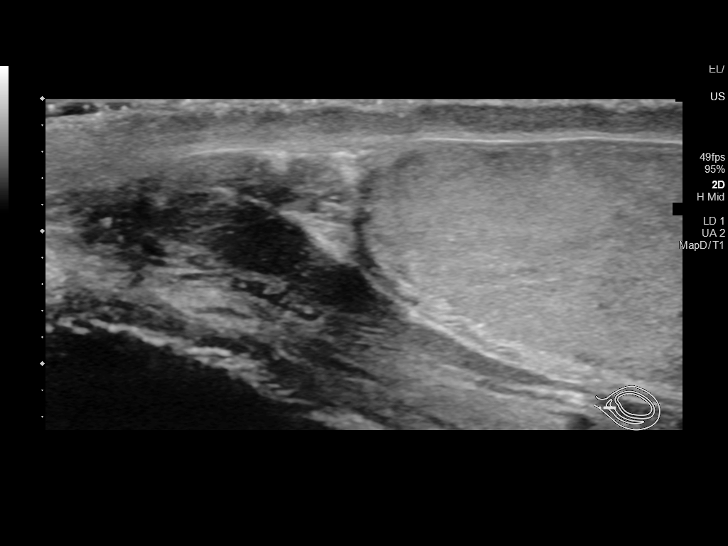
[im 50/50]
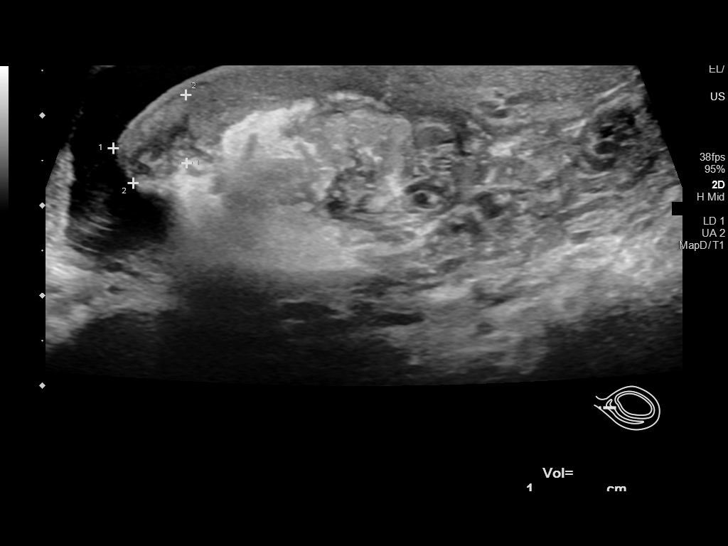

[13 of 25 positions shown; findings below may reference images not displayed]

FINDINGS: Right testicle

Measurements: 4.3 x 2.0 x 1.8 cm. No mass or microlithiasis
visualized. Tiny simple cyst peripherally in the left testicle is
stable and likely reflects tunic albuginea cyst.

Left testicle

Measurements: 3.0 x 2.1 x 2.3 cm. No mass or microlithiasis
visualized.

Right epididymis:  Normal in size and appearance.

Left epididymis: Asymmetrically increased in size and heterogeneity
with hyperemia on color flow Doppler.

Hydrocele:  Left hydrocele with debris, similar to prior

Varicocele:  None visualized.

Pulsed Doppler interrogation of both testes demonstrates normal low
resistance arterial and venous waveforms bilaterally.
IMPRESSION: 1. No substantial change in the 2 day interval since prior study.
2. No evidence for testicular torsion.
3. Enlarged heterogeneous left epididymis with increased blood flow,
features suggesting epididymitis.
4. Left hydrocele contains debris.

## 2019-12-01 DIAGNOSIS — D649 Anemia, unspecified: Secondary | ICD-10-CM | POA: Diagnosis not present

## 2019-12-04 DIAGNOSIS — J441 Chronic obstructive pulmonary disease with (acute) exacerbation: Secondary | ICD-10-CM | POA: Diagnosis not present

## 2019-12-11 DIAGNOSIS — Z0000 Encounter for general adult medical examination without abnormal findings: Secondary | ICD-10-CM | POA: Diagnosis not present

## 2019-12-30 DIAGNOSIS — E7800 Pure hypercholesterolemia, unspecified: Secondary | ICD-10-CM | POA: Diagnosis not present

## 2019-12-30 DIAGNOSIS — N183 Chronic kidney disease, stage 3 unspecified: Secondary | ICD-10-CM | POA: Diagnosis not present

## 2019-12-30 DIAGNOSIS — I2510 Atherosclerotic heart disease of native coronary artery without angina pectoris: Secondary | ICD-10-CM | POA: Diagnosis not present

## 2019-12-30 DIAGNOSIS — I10 Essential (primary) hypertension: Secondary | ICD-10-CM | POA: Diagnosis not present

## 2019-12-30 DIAGNOSIS — N400 Enlarged prostate without lower urinary tract symptoms: Secondary | ICD-10-CM | POA: Diagnosis not present

## 2019-12-30 DIAGNOSIS — M1990 Unspecified osteoarthritis, unspecified site: Secondary | ICD-10-CM | POA: Diagnosis not present

## 2019-12-30 DIAGNOSIS — J449 Chronic obstructive pulmonary disease, unspecified: Secondary | ICD-10-CM | POA: Diagnosis not present

## 2020-01-03 DIAGNOSIS — J441 Chronic obstructive pulmonary disease with (acute) exacerbation: Secondary | ICD-10-CM | POA: Diagnosis not present

## 2020-01-05 DIAGNOSIS — D649 Anemia, unspecified: Secondary | ICD-10-CM | POA: Diagnosis not present

## 2020-01-21 DIAGNOSIS — R8279 Other abnormal findings on microbiological examination of urine: Secondary | ICD-10-CM | POA: Diagnosis not present

## 2020-01-21 DIAGNOSIS — N401 Enlarged prostate with lower urinary tract symptoms: Secondary | ICD-10-CM | POA: Diagnosis not present

## 2020-01-21 DIAGNOSIS — R3915 Urgency of urination: Secondary | ICD-10-CM | POA: Diagnosis not present

## 2020-01-22 ENCOUNTER — Ambulatory Visit: Payer: Medicaid Other | Admitting: Specialist

## 2020-01-28 ENCOUNTER — Ambulatory Visit (INDEPENDENT_AMBULATORY_CARE_PROVIDER_SITE_OTHER): Payer: Medicare HMO

## 2020-01-28 ENCOUNTER — Encounter: Payer: Self-pay | Admitting: Specialist

## 2020-01-28 ENCOUNTER — Ambulatory Visit (INDEPENDENT_AMBULATORY_CARE_PROVIDER_SITE_OTHER): Payer: Medicare HMO | Admitting: Specialist

## 2020-01-28 ENCOUNTER — Other Ambulatory Visit: Payer: Self-pay

## 2020-01-28 VITALS — BP 121/76 | HR 90 | Ht 73.0 in | Wt 165.5 lb

## 2020-01-28 DIAGNOSIS — M25511 Pain in right shoulder: Secondary | ICD-10-CM | POA: Diagnosis not present

## 2020-01-28 DIAGNOSIS — M542 Cervicalgia: Secondary | ICD-10-CM | POA: Diagnosis not present

## 2020-01-28 DIAGNOSIS — R200 Anesthesia of skin: Secondary | ICD-10-CM | POA: Diagnosis not present

## 2020-01-28 DIAGNOSIS — M25512 Pain in left shoulder: Secondary | ICD-10-CM | POA: Diagnosis not present

## 2020-01-28 DIAGNOSIS — M75122 Complete rotator cuff tear or rupture of left shoulder, not specified as traumatic: Secondary | ICD-10-CM | POA: Diagnosis not present

## 2020-01-28 DIAGNOSIS — M4722 Other spondylosis with radiculopathy, cervical region: Secondary | ICD-10-CM | POA: Diagnosis not present

## 2020-01-28 DIAGNOSIS — G8929 Other chronic pain: Secondary | ICD-10-CM | POA: Diagnosis not present

## 2020-01-28 MED ORDER — TRAMADOL HCL 50 MG PO TABS: 100.0000 mg | ORAL_TABLET | Freq: Four times a day (QID) | ORAL | 0 refills | Status: DC | PRN

## 2020-01-28 MED ORDER — METHYLPREDNISOLONE 4 MG PO TBPK: ORAL_TABLET | ORAL | 0 refills | Status: DC

## 2020-01-28 NOTE — Patient Instructions (Addendum)
Avoid overhead lifting and overhead use of the arms. Do not lift greater than 5 lbs. Adjust head rest in vehicle to prevent hyperextension if rear ended. Take extra precautions to avoid falling. EMGs/NCV of the left arm ordered to assess for pinched nerve in the neck causing part of the pain into the left arm. Medrol dose pak Continue with gabapentin. Please bring [place] the copy of the MRI of your shoulder when you return to see Dr. Marlou Sa. Do not take NSAIDs like motrin, or naprosyn or any other arthritis medication besides tylenol.Plan:   Injection with steroid may be of benefit. Hemp CBD capsules, amazon.com 5,000-7,000 mg per bottle, 60 capsules per bottle, take one capsule twice a day. Cane in the left hand to use with left leg weight bearing. Follow-Up Instructions: No follow-ups on file.

## 2020-01-28 NOTE — Progress Notes (Addendum)
Office Visit Note   Patient: Gregory Christensen           Date of Birth: Jun 20, 1951           MRN: [id number] Visit Date: 01/28/2020              Requested by: Leighton Ruff, MD [place],  [place] [address] PCP: Leighton Ruff, MD   Assessment & Plan: Visit Diagnoses:  1. Cervicalgia   2. Chronic pain of both shoulders   3. Complete tear of left rotator cuff, unspecified whether traumatic   4. Left arm numbness   5. Other spondylosis with radiculopathy, cervical region     Plan: Avoid overhead lifting and overhead use of the arms. Do not lift greater than 5 lbs. Adjust head rest in vehicle to prevent hyperextension if rear ended. Take extra precautions to avoid falling. EMGs/NCV of the left arm ordered to assess for pinched nerve in the neck causing part of the pain into the left arm. Medrol dose pak Continue with gabapentin. Do not take NSAIDs like motrin, or naprosyn or any other arthritis medication besides tylenol.Plan:   Injection with steroid may be of benefit. Please bring [place] the copy of the MRI of your shoulder when you return to see Dr. Marlou Sa. Hemp CBD capsules, amazon.com 5,000-7,000 mg per bottle, 60 capsules per bottle, take one capsule twice a day. Cane in the left hand to use with left leg weight bearing. Follow-Up Instructions: No follow-ups on file.   Follow-Up Instructions: Return in about 3 weeks (around 02/18/2020), or Dr. Marlou Sa evaluate for considering left rotator cuff repair..   Orders:  Orders Placed This Encounter  Procedures  . XR Cervical Spine 2 or 3 views  . Ambulatory referral to Physical Medicine Rehab   Meds ordered this encounter  Medications  . methylPREDNISolone (MEDROL DOSEPAK) 4 MG TBPK tablet    Sig: Take as directed    Dispense:  21 tablet    Refill:  0  . traMADol (ULTRAM) 50 MG tablet    Sig: Take 2 tablets (100 mg total) by mouth every 6 (six) hours as needed for moderate pain.    Dispense:  40 tablet     Refill:  0      Procedures: No procedures performed   Clinical Data: No additional findings.   Subjective: Chief Complaint  Patient presents with  . Left Shoulder - Pain    69 year old right handed male with a several year history of bilateral shoulder pain. He is being seen at the [facility] in [place] and has had an MRI of the left shoulder that is worse. The results of the MRI demonstrate a left shoulder completer rotator cuff tear. He reports pain left shoulder is worsening and he had an appointment since May to be seen by myself. His orthopaedic surgeon in [place] is unwilling to consider surgery due to pulmonary concerns. He is on chronic O2 prn 2% or 2 lpnc with an Oxygen tank portable. He has a history COPD and He see Danton Sewer MD. At the [facility] at [place] and was last seen about 6 months. He has not given an opinion about the  Left shoulder surgery or its risks. He was referred by Dr. Hart Rochester, he is on eloquis and has had 2-3 surgeries since last year, had nephrectomy and scrotal mass removed. 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  Scrotal mass not told results of the surgery Pathology.  The renal tumor apparently controlled with the nephrectomy.  He has difficulty raising the left shoulder and the pain is constant and about "9.5". There is tingling in the left arm in to the  Fingers left arm. He has left neck pain with extension of the neck and it radiates into the left arm. No bowel or bladder difficutly, no gait disturbance.  Pain is down to the left elbow. He has only had MRI of the left shoulder and this was reviewed with him. He has had injections of the left one time and no therapy. Mainly he is ready to have surgery for the left arm but Dr. Laurance Flatten is reluctant to consider due to his COPD.   Review of Systems  Constitutional: Positive for activity change. Negative for appetite change, chills, diaphoresis, fatigue, fever and unexpected  weight change.  Eyes: Negative.  Negative for photophobia, pain, discharge, redness, itching and visual disturbance.  Respiratory: Positive for cough (Covid positive August 2020[facility]   Cardiovascular: Negative.  Negative for chest pain, palpitations and leg swelling.  Gastrointestinal: Negative.  Negative for abdominal distention, abdominal pain, anal bleeding, blood in stool, constipation (black stool from iron supplements, takes stool softener), diarrhea, nausea, rectal pain and vomiting.  Endocrine: Positive for cold intolerance. Negative for heat intolerance, polydipsia, polyphagia and polyuria.  Genitourinary: Negative.   Musculoskeletal: Positive for back pain, joint swelling, neck pain and neck stiffness. Negative for arthralgias, gait problem and myalgias.  Skin: Positive for wound (on scalp open wound but it is healing. ). Negative for color change, pallor and rash.  Allergic/Immunologic: Negative for environmental allergies, food allergies and immunocompromised state.  Neurological: Positive for weakness and numbness. Negative for dizziness, tremors, seizures, syncope, facial asymmetry, speech difficulty, light-headedness and headaches.  Hematological: Negative.  Negative for adenopathy. Does not bruise/bleed easily.  Psychiatric/Behavioral: Negative.  Negative for agitation, behavioral problems, confusion, decreased concentration, dysphoric mood, hallucinations, self-injury, sleep disturbance and suicidal ideas. The patient is not nervous/anxious and is not hyperactive.      Objective: Vital Signs: BP 121/76 (BP Location: Left Arm, Patient Position: Sitting)   Pulse 90   Ht 6\' 1"  (1.854 m)   Wt 165 lb 8 oz (75.1 kg)   BMI 21.84 kg/m   Physical Exam Vitals reviewed.  Constitutional:      Appearance: Normal appearance.  HENT:     Nose: No congestion or rhinorrhea.     Mouth/Throat:     Pharynx: No oropharyngeal exudate or posterior  oropharyngeal erythema.  Eyes:     General:        Right eye: No discharge.        Left eye: No discharge.  Pulmonary:     Effort: Pulmonary effort is normal.     Breath sounds: Normal breath sounds.  Abdominal:     General: Abdomen is flat.     Palpations: Abdomen is soft.  Musculoskeletal:        General: Swelling and tenderness present.  Neurological:     Mental Status: He is alert.  Psychiatric:        Mood and Affect: Mood normal.        Behavior: Behavior normal.     Ortho Exam  Specialty Comments:  No specialty comments available.  Imaging: XR Cervical Spine 2 or 3 views  Result Date: 01/28/2020 AP and lateral cervical spine with left C4-5, C5-6 and C6-7 spondylosis changes and UV hypertrophy, narrowing of the disc height C4-5,  C6-7 and C5-6.    PMFS History: Patient Active Problem List   Diagnosis Date Noted  . Benign neoplasm of transverse colon   . Benign neoplasm of descending colon   . Benign neoplasm of rectum   . Heme positive stool   . Iron deficiency anemia due to chronic blood loss   . Melena   . Abdominal pain, chronic, right lower quadrant   . Chronic anticoagulation   . Symptomatic anemia 09/24/2019  . Pneumonia due to severe acute respiratory syndrome coronavirus 2 (SARS-CoV-2) 02/28/2019  . Renal mass 10/31/2018  . Chest pressure 10/15/2018  . Coronary artery calcification 10/15/2018  . Respiratory failure, acute-on-chronic ([place]) 08/03/2018  . Acute deep vein thrombosis (DVT) of left lower extremity ([place])   . Acute hypoxemic respiratory failure ([place])   . Pulmonary embolism ([place]) 05/31/2018  . COPD exacerbation ([place]) 10/18/2017  . Hypoxia   . Respiratory distress   . Essential hypertension    Past Medical History:  Diagnosis Date  . Acute deep vein thrombosis (DVT) of left lower extremity ([place])   . Acute hypoxemic respiratory failure ([place])   . Anxiety   . Aortic atherosclerosis ([place])   . Arthritis   . Benign prostatic hyperplasia  08/28/2018   UNSPECIFIED WHETHER LOWER URINARY TRACT SYMPTOMS PRESENT  . CAD (coronary artery disease)   . Chronic back pain   . Chronic hip pain   . COPD exacerbation ([place]) 10/18/2017  . Emphysema lung ([place])   . Essential hypertension   . GERD (gastroesophageal reflux disease)   . Gout   . Gross hematuria 09/08/2018  . History of tobacco abuse   . Hypoxia   . Pulmonary embolism ([place]) 05/31/2018  . Renal mass    CONCERNING FOR RENAL CELL CARCINOMA DR. Lovena Neighbours  . Respiratory distress   . Respiratory failure, acute-on-chronic ([place]) 08/03/2018  . Seasonal allergies   . Sleep apnea     Family History  Problem Relation Age of Onset  . Diabetes Mother   . Hypertension Sister   . Hypertension Brother     Past Surgical History:  Procedure Laterality Date  . COLONOSCOPY WITH PROPOFOL N/A 09/27/2019   Procedure: COLONOSCOPY WITH PROPOFOL;  Surgeon: Irene Shipper, MD;  Location: [facility];  Service: Endoscopy;  Laterality: N/A;  . CYSTOSCOPY/URETEROSCOPY/HOLMIUM LASER/STENT PLACEMENT Left 10/31/2018   Procedure: CYSTOSCOPY/URETEROSCOPY;  Surgeon: Ceasar Mons, MD;  Location: [facility];  Service: Urology;  Laterality: Left;  . ESOPHAGOGASTRODUODENOSCOPY (EGD) WITH PROPOFOL N/A 09/27/2019   Procedure: ESOPHAGOGASTRODUODENOSCOPY (EGD) WITH PROPOFOL;  Surgeon: Irene Shipper, MD;  Location: [facility];  Service: Endoscopy;  Laterality: N/A;  . [place]  . HYDROCELE EXCISION Left 05/13/2019   Procedure: HYDROCELECTOMY ADULT;  Surgeon: Ceasar Mons, MD;  Location: [facility];  Service: Urology;  Laterality: Left;  Marland Kitchen MANDIBLE SURGERY  1976   EXTENSIVE  . POLYPECTOMY  09/27/2019   Procedure: POLYPECTOMY;  Surgeon: Irene Shipper, MD;  Location: [facility];  Service: Endoscopy;;  . ROBOT ASSITED LAPAROSCOPIC NEPHROURETERECTOMY Left 10/31/2018   Procedure: XI ROBOT ASSITED LAPAROSCOPIC NEPHROURETERECTOMY;  Surgeon: Ceasar Mons, MD;  Location: [facility];   Service: Urology;  Laterality: Left;  ONLY NEEDS 240 MIN FOR ALL PROCEDURES   Social History   Occupational History  . Not on file  Tobacco Use  . Smoking status: Former Smoker    Packs/day: 1.00    Years: 26.00    Pack years: 26.00    Types: Cigarettes  Quit date: 10/18/2017    Years since quitting: 2.2  . Smokeless tobacco: Never Used  Vaping Use  . Vaping Use: Never used  Substance and Sexual Activity  . Alcohol use: Not Currently    Comment: 2 beers a day   . Drug use: Not Currently    Types: Flunitrazepam  . Sexual activity: Not on file

## 2020-01-29 DIAGNOSIS — N3000 Acute cystitis without hematuria: Secondary | ICD-10-CM | POA: Diagnosis not present

## 2020-02-01 ENCOUNTER — Telehealth: Payer: Self-pay | Admitting: Specialist

## 2020-02-01 IMAGING — CT CT ABD-PELV W/O CM
2 of 4 series · 15 of 46 positions shown, 17 images · non-contrast
Comparison: [DATE] [DATE], [DATE].  [DATE] [DATE], [DATE].

CLINICAL DATA: Flank pain.

EXAM:
CT ABDOMEN AND PELVIS WITHOUT CONTRAST
TECHNIQUE: Multidetector CT imaging of the abdomen and pelvis was performed
following the standard protocol without IV contrast.

[Series 2: axial st · axial · 0.68mm/px · z∈[-606,-156]mm · 12 of 101 slices shown, 14 images]
[im 6/101  soft-tissue]
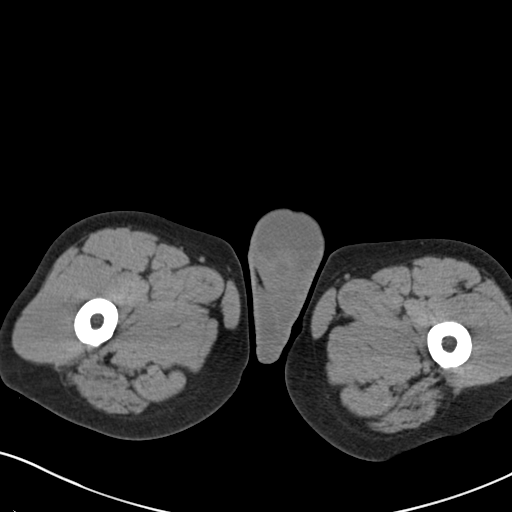
[im 6/101  bone]
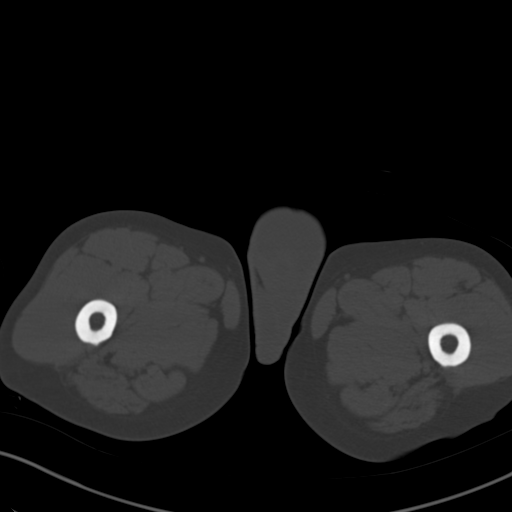
[im 16/101  soft-tissue]
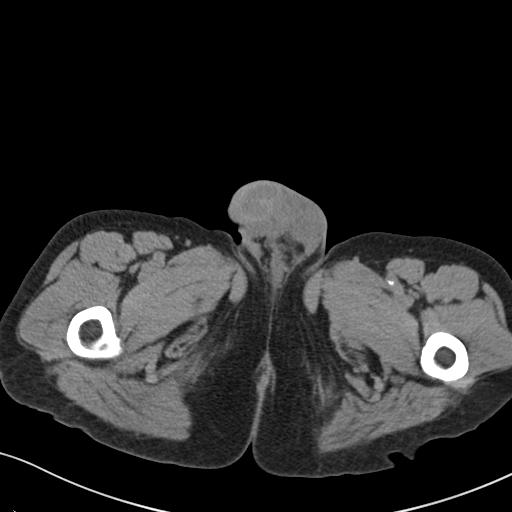
[im 21/101  soft-tissue]
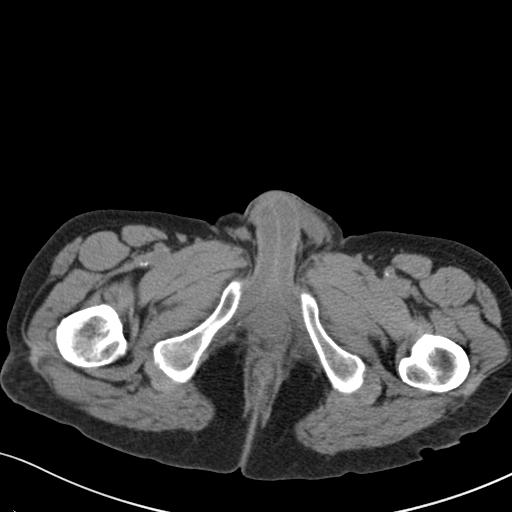
[im 31/101  soft-tissue]
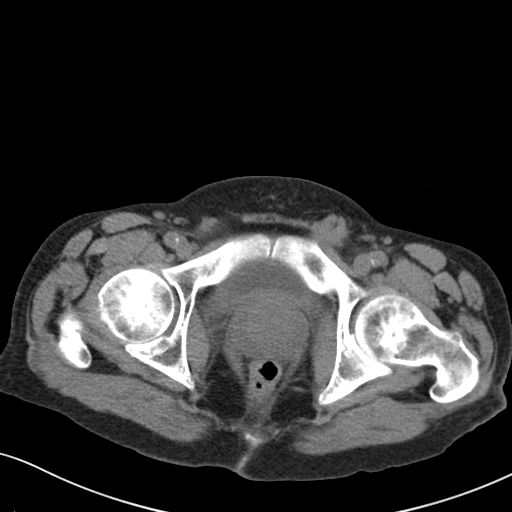
[im 41/101  soft-tissue]
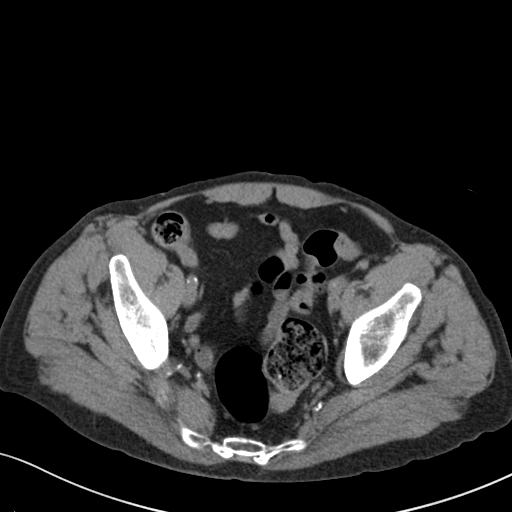
[im 46/101  soft-tissue]
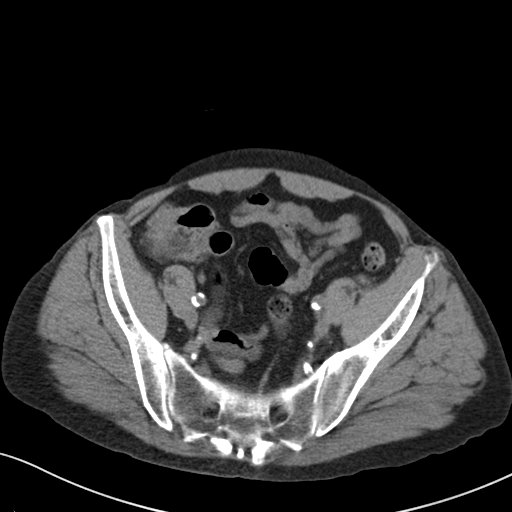
[im 56/101  soft-tissue]
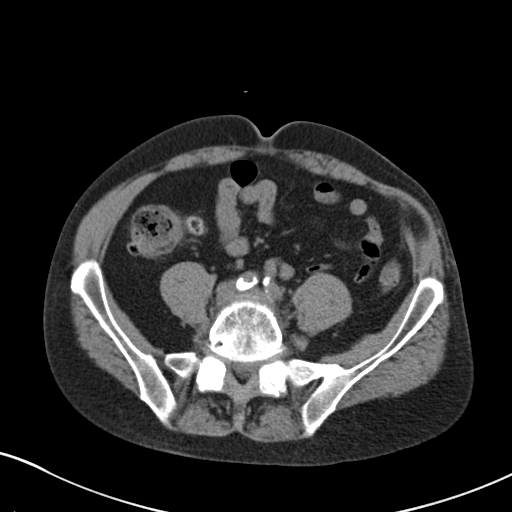
[im 61/101  soft-tissue]
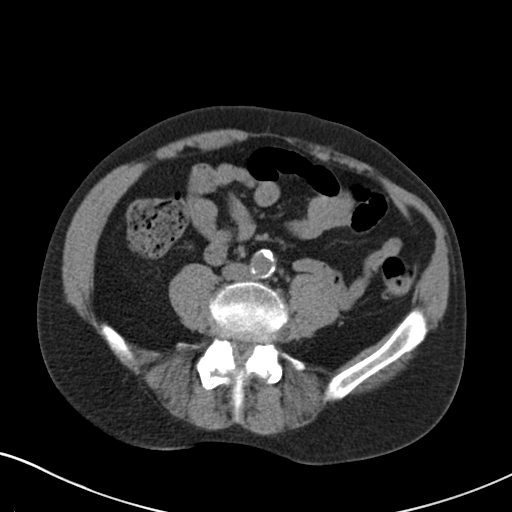
[im 71/101  soft-tissue]
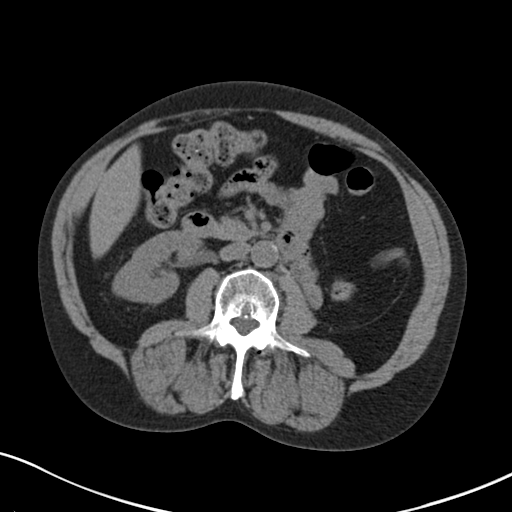
[im 71/101  bone]
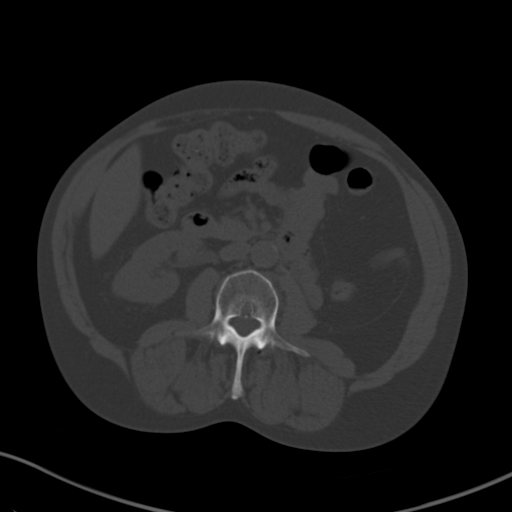
[im 81/101  soft-tissue]
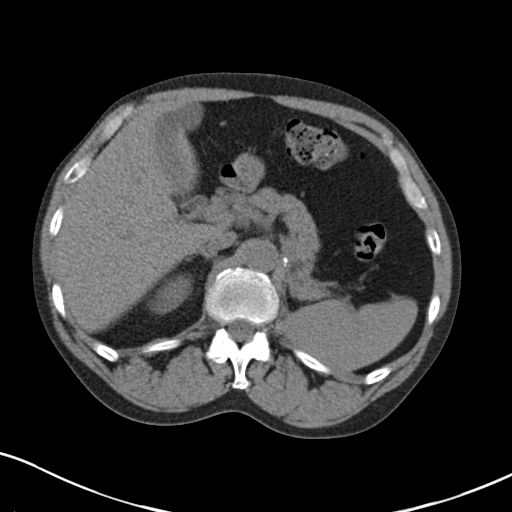
[im 86/101  soft-tissue]
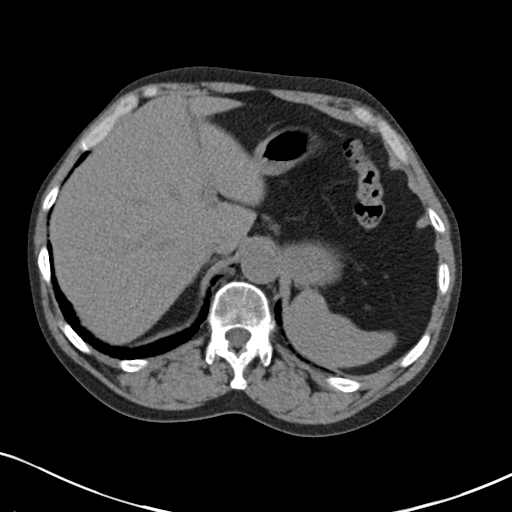
[im 96/101  soft-tissue]
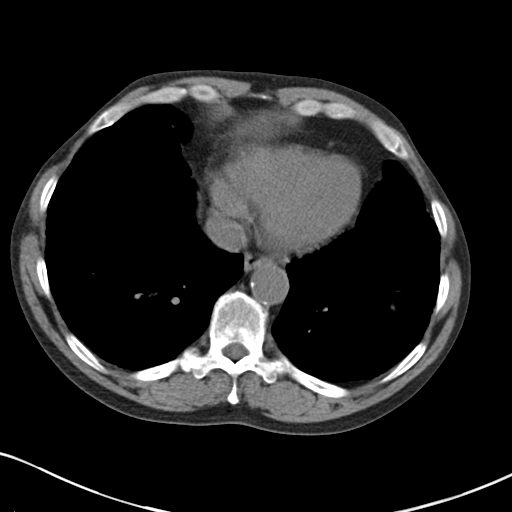

[Series 5: coronal · coronal · 0.64mm/px · 3 of 133 slices shown]
[im 45/133  soft-tissue]
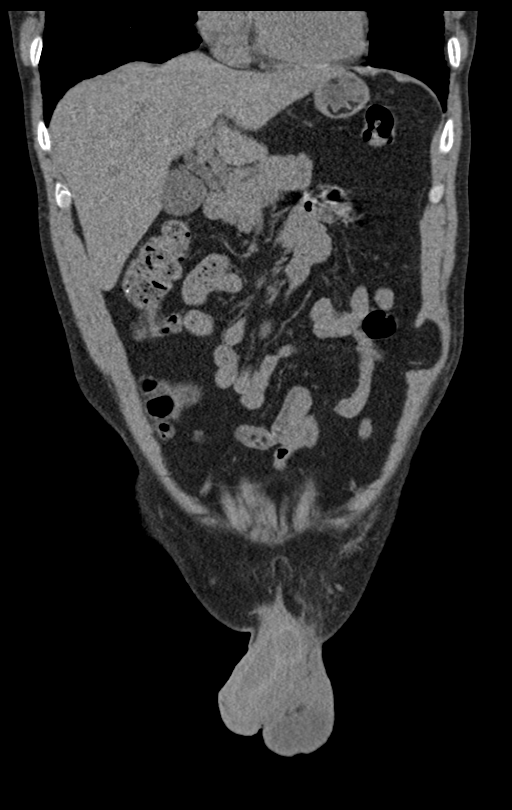
[im 59/133  soft-tissue]
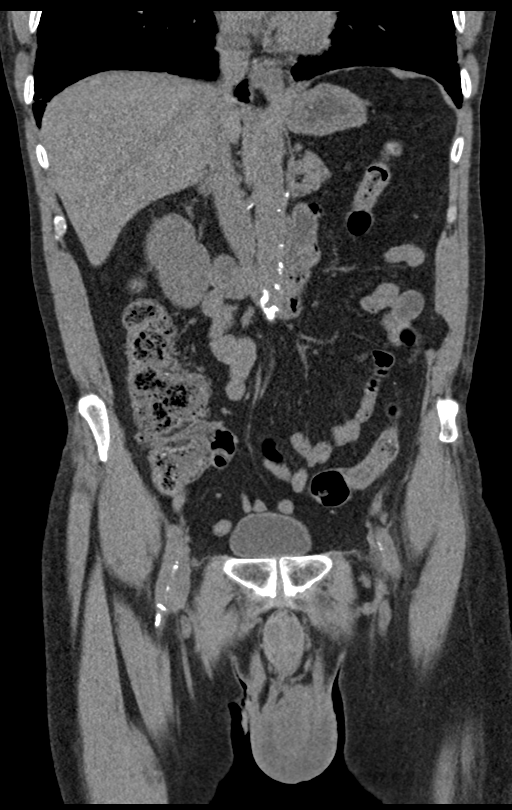
[im 74/133  soft-tissue]
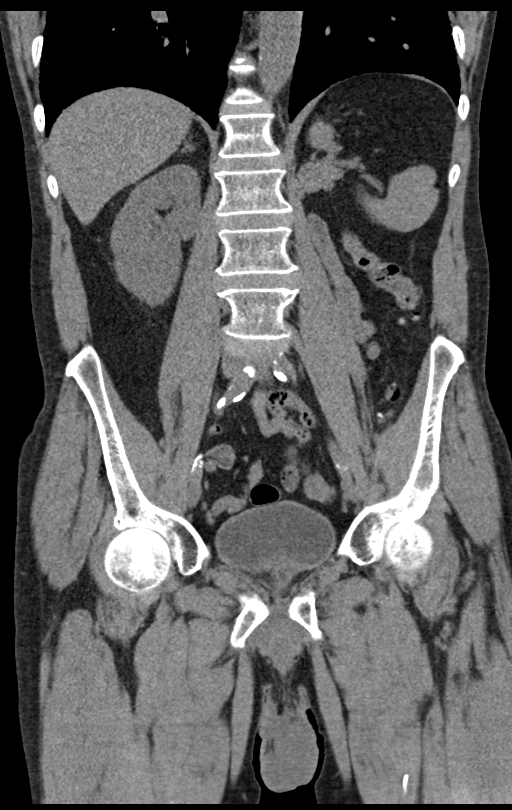

[15 of 46 positions shown; findings below may reference images not displayed]

FINDINGS: Lower chest: No acute abnormality.

Hepatobiliary: No focal liver abnormality is seen. No gallstones,
gallbladder wall thickening, or biliary dilatation.

Pancreas: Unremarkable. No pancreatic ductal dilatation or
surrounding inflammatory changes.

Spleen: Normal in size without focal abnormality.

Adrenals/Urinary Tract: Status post left nephrectomy. Left adrenal
gland is not well visualized. Right adrenal gland appears normal.
Right kidney and ureter are unremarkable. No hydronephrosis or renal
obstruction is noted. No renal or ureteral calculi are noted.
Urinary bladder is unremarkable.

Stomach/Bowel: Stomach is within normal limits. Appendix appears
normal. No evidence of bowel wall thickening, distention, or
inflammatory changes.

Vascular/Lymphatic: Aortic atherosclerosis. No enlarged abdominal or
pelvic lymph nodes.

Reproductive: Stable mild prostatic enlargement is noted. Stable
left hydrocele is noted.

Other: No abdominal wall hernia or abnormality. No abdominopelvic
ascites.

Musculoskeletal: No acute or significant osseous findings.
IMPRESSION: Status post left nephrectomy.

No hydronephrosis or renal obstruction is noted.

Stable left hydrocele is noted as noted on prior studies.

Stable mild prostatic enlargement.

No acute abnormality seen in the abdomen or pelvis.

Aortic Atherosclerosis ([id number]-[id number]).

## 2020-02-01 NOTE — Telephone Encounter (Signed)
01/28/20 ov note faxed to [facility] [phone]

## 2020-02-03 DIAGNOSIS — J441 Chronic obstructive pulmonary disease with (acute) exacerbation: Secondary | ICD-10-CM | POA: Diagnosis not present

## 2020-02-05 DIAGNOSIS — N401 Enlarged prostate with lower urinary tract symptoms: Secondary | ICD-10-CM | POA: Diagnosis not present

## 2020-02-05 DIAGNOSIS — C652 Malignant neoplasm of left renal pelvis: Secondary | ICD-10-CM | POA: Diagnosis not present

## 2020-02-16 DIAGNOSIS — D649 Anemia, unspecified: Secondary | ICD-10-CM | POA: Diagnosis not present

## 2020-02-18 ENCOUNTER — Ambulatory Visit (INDEPENDENT_AMBULATORY_CARE_PROVIDER_SITE_OTHER): Payer: Medicare HMO | Admitting: Orthopedic Surgery

## 2020-02-18 DIAGNOSIS — M75112 Incomplete rotator cuff tear or rupture of left shoulder, not specified as traumatic: Secondary | ICD-10-CM

## 2020-02-20 ENCOUNTER — Encounter: Payer: Self-pay | Admitting: Orthopedic Surgery

## 2020-02-20 NOTE — Progress Notes (Signed)
Office Visit Note   Patient: Gregory Christensen           Date of Birth: Aug 20, 1950           MRN: [id number] Visit Date: 02/18/2020 Requested by: Leighton Ruff, MD [place],  [place] [id number] PCP: Leighton Ruff, MD  Subjective: Chief Complaint  Patient presents with  . Left Shoulder - Pain    HPI: Gregory Christensen is a 69 y.o. male who presents to the office complaining of left shoulder pain.  Patient is referred from Dr. Louanne Skye for further evaluation of left shoulder pain.  Patient notes 1 year of pain.  He localizes pain to the lateral aspect of the left shoulder with radiation to the elbow.  Denies any numbness/tingling in the arm.  He does note subjective weakness and states that raising his arm up makes his pain worse.  He has no history of shoulder surgery.  He has been taking tramadol with some relief and he had a subacromial injection in May of this year that provided about 1 week of relief for his shoulder pain.  Pain wakes him up at night.  He is scheduled for nerve conduction study with Dr. Laurence Spates on September 3.  He had an MRI scan of his left shoulder that revealed high-grade partial thickness tear of the supraspinatus, low-grade partial tear of the infraspinatus, mild to moderate rotator cuff tendinosis, anterior labral tear, AC joint arthrosis.  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  He has been off his Eliquis for 2 procedures over the last year..                ROS: All systems reviewed are negative as they relate to the chief complaint within the history of present illness.  Patient denies fevers or chills.  Assessment & Plan: Visit Diagnoses:  1. Nontraumatic incomplete tear of left rotator cuff     Plan: Patient is a 69 year old male who presents complaining of left shoulder pain.  He has had left shoulder pain for about 1 year.  MRI findings are as described above in HPI.  He has had good relief that was only lasting  for about 1 week with a subacromial injection back in May.  He does have tenderness over the [facility] joint.  He has pain with infraspinatus and supraspinatus resistance testing.  He does have good strength though.  After long discussion with Daryl about the risk and benefits of surgical intervention he is decided he would like to proceed with rotator cuff repair.  I think he does have a lot of impingement and bursal sided tearing of that supraspinatus tendon which is symptomatic for him.  Has been going on for a long time.  Nerve study is pending and will have that information prior to his surgery.  The risk and benefits of surgery are discussed including not limited to infection nerve vessel damage shoulder stiffness as well as prolonged rehabilitative process.  His AC joint although arthritic is not particularly tender today and I do not think that requires intervention.  I do think a subacromial decompression and rotator cuff repair could give him relief with his shoulder.  Would not recommend interscalene block due to his COPD.  All questions answered.  Plan to use CPM machine after surgery.  He is on Eliquis currently.  Scheduled for nerve conduction study on September 3 with Dr. Ernestina Patches.  He does have COPD for which  she is on oxygen, which may be a contraindication for a interscalene block if surgical management is indicated..  Follow-Up Instructions: No follow-ups on file.   Orders:  No orders of the defined types were placed in this encounter.  No orders of the defined types were placed in this encounter.     Procedures: No procedures performed   Clinical Data: No additional findings.  Objective: Vital Signs: There were no vitals taken for this visit.  Physical Exam:  Constitutional: Patient appears well-developed HEENT:  Head: Normocephalic Eyes:EOM are normal Neck: Normal range of motion Cardiovascular: Normal rate Pulmonary/chest: Effort normal Neurologic: Patient is alert Skin:  Skin is warm Psychiatric: Patient has normal mood and affect  Ortho Exam: Ortho exam demonstrates left shoulder with 60 degrees external rotation, 90 degrees abduction, 140 degrees forward flexion.  5/5 motor strength of the bilateral supraspinatus, infraspinatus, subscapularis.  5/5 motor strength of the grip strength, pronation/supination, bicep, tricep, deltoid.  Significant pain with supraspinatus/infraspinatus resistance testing.  Tenderness to palpation over the left [facility] joint that is not present on the contralateral side.  Positive O'Brien's test.  Specialty Comments:  No specialty comments available.  Imaging: No results found.   PMFS History: Patient Active Problem List   Diagnosis Date Noted  . Benign neoplasm of transverse colon   . Benign neoplasm of descending colon   . Benign neoplasm of rectum   . Heme positive stool   . Iron deficiency anemia due to chronic blood loss   . Melena   . Abdominal pain, chronic, right lower quadrant   . Chronic anticoagulation   . Symptomatic anemia 09/24/2019  . Pneumonia due to severe acute respiratory syndrome coronavirus 2 (SARS-CoV-2) 02/28/2019  . Renal mass 10/31/2018  . Chest pressure 10/15/2018  . Coronary artery calcification 10/15/2018  . Respiratory failure, acute-on-chronic ([place]) 08/03/2018  . Acute deep vein thrombosis (DVT) of left lower extremity ([place])   . Acute hypoxemic respiratory failure ([place])   . Pulmonary embolism ([place]) 05/31/2018  . COPD exacerbation ([place]) 10/18/2017  . Hypoxia   . Respiratory distress   . Essential hypertension    Past Medical History:  Diagnosis Date  . Acute deep vein thrombosis (DVT) of left lower extremity ([place])   . Acute hypoxemic respiratory failure ([place])   . Anxiety   . Aortic atherosclerosis ([place])   . Arthritis   . Benign prostatic hyperplasia 08/28/2018   UNSPECIFIED WHETHER LOWER URINARY TRACT SYMPTOMS PRESENT  . CAD (coronary artery disease)   . Chronic back pain   . Chronic  hip pain   . COPD exacerbation ([place]) 10/18/2017  . Emphysema lung ([place])   . Essential hypertension   . GERD (gastroesophageal reflux disease)   . Gout   . Gross hematuria 09/08/2018  . History of tobacco abuse   . Hypoxia   . Pulmonary embolism ([place]) 05/31/2018  . Renal mass    CONCERNING FOR RENAL CELL CARCINOMA DR. Lovena Neighbours  . Respiratory distress   . Respiratory failure, acute-on-chronic ([place]) 08/03/2018  . Seasonal allergies   . Sleep apnea     Family History  Problem Relation Age of Onset  . Diabetes Mother   . Hypertension Sister   . Hypertension Brother     Past Surgical History:  Procedure Laterality Date  . COLONOSCOPY WITH PROPOFOL N/A 09/27/2019   Procedure: COLONOSCOPY WITH PROPOFOL;  Surgeon: Irene Shipper, MD;  Location: [facility] ENDOSCOPY;  Service: Endoscopy;  Laterality: N/A;  . CYSTOSCOPY/URETEROSCOPY/HOLMIUM LASER/STENT PLACEMENT Left 10/31/2018  Procedure: CYSTOSCOPY/URETEROSCOPY;  Surgeon: Ceasar Mons, MD;  Location: [facility] ORS;  Service: Urology;  Laterality: Left;  . ESOPHAGOGASTRODUODENOSCOPY (EGD) WITH PROPOFOL N/A 09/27/2019   Procedure: ESOPHAGOGASTRODUODENOSCOPY (EGD) WITH PROPOFOL;  Surgeon: Irene Shipper, MD;  Location: [facility] ENDOSCOPY;  Service: Endoscopy;  Laterality: N/A;  . [place]  . HYDROCELE EXCISION Left 05/13/2019   Procedure: HYDROCELECTOMY ADULT;  Surgeon: Ceasar Mons, MD;  Location: [facility] ORS;  Service: Urology;  Laterality: Left;  Marland Kitchen MANDIBLE SURGERY  1976   EXTENSIVE  . POLYPECTOMY  09/27/2019   Procedure: POLYPECTOMY;  Surgeon: Irene Shipper, MD;  Location: [facility];  Service: Endoscopy;;  . ROBOT ASSITED LAPAROSCOPIC NEPHROURETERECTOMY Left 10/31/2018   Procedure: XI ROBOT ASSITED LAPAROSCOPIC NEPHROURETERECTOMY;  Surgeon: Ceasar Mons, MD;  Location: [facility];  Service: Urology;  Laterality: Left;  ONLY NEEDS 240 MIN FOR ALL PROCEDURES   Social History   Occupational History  . Not on file  Tobacco  Use  . Smoking status: Former Smoker    Packs/day: 1.00    Years: 26.00    Pack years: 26.00    Types: Cigarettes    Quit date: 10/18/2017    Years since quitting: 2.3  . Smokeless tobacco: Never Used  Vaping Use  . Vaping Use: Never used  Substance and Sexual Activity  . Alcohol use: Not Currently    Comment: 2 beers a day   . Drug use: Not Currently    Types: Flunitrazepam  . Sexual activity: Not on file

## 2020-02-28 DIAGNOSIS — E7800 Pure hypercholesterolemia, unspecified: Secondary | ICD-10-CM | POA: Diagnosis not present

## 2020-02-28 DIAGNOSIS — I10 Essential (primary) hypertension: Secondary | ICD-10-CM | POA: Diagnosis not present

## 2020-02-28 DIAGNOSIS — K219 Gastro-esophageal reflux disease without esophagitis: Secondary | ICD-10-CM | POA: Diagnosis not present

## 2020-02-28 DIAGNOSIS — J441 Chronic obstructive pulmonary disease with (acute) exacerbation: Secondary | ICD-10-CM | POA: Diagnosis not present

## 2020-02-28 DIAGNOSIS — I2510 Atherosclerotic heart disease of native coronary artery without angina pectoris: Secondary | ICD-10-CM | POA: Diagnosis not present

## 2020-02-28 DIAGNOSIS — J449 Chronic obstructive pulmonary disease, unspecified: Secondary | ICD-10-CM | POA: Diagnosis not present

## 2020-02-28 DIAGNOSIS — N400 Enlarged prostate without lower urinary tract symptoms: Secondary | ICD-10-CM | POA: Diagnosis not present

## 2020-02-28 DIAGNOSIS — M1990 Unspecified osteoarthritis, unspecified site: Secondary | ICD-10-CM | POA: Diagnosis not present

## 2020-02-28 DIAGNOSIS — N183 Chronic kidney disease, stage 3 unspecified: Secondary | ICD-10-CM | POA: Diagnosis not present

## 2020-03-04 ENCOUNTER — Other Ambulatory Visit: Payer: Self-pay

## 2020-03-04 ENCOUNTER — Ambulatory Visit (INDEPENDENT_AMBULATORY_CARE_PROVIDER_SITE_OTHER): Payer: Medicare HMO | Admitting: Physical Medicine and Rehabilitation

## 2020-03-04 DIAGNOSIS — R202 Paresthesia of skin: Secondary | ICD-10-CM | POA: Diagnosis not present

## 2020-03-04 DIAGNOSIS — M25512 Pain in left shoulder: Secondary | ICD-10-CM

## 2020-03-04 NOTE — Progress Notes (Signed)
Pt state left shoulder pain. Pt state at night when he sleep makes the pain worse. Pt state it hard to lift up his arm and shoulder or bring out to his side. Right Dominant Hand.   Numeric Pain Rating Scale and Functional Assessment Average Pain 7   In the last MONTH (on 0-10 scale) has pain interfered with the following?  1. General activity like being  able to carry out your everyday physical activities such as walking, climbing stairs, carrying groceries, or moving a chair?  Rating(4)

## 2020-03-04 NOTE — Procedures (Signed)
EMG & NCV Findings: Evaluation of the left median motor nerve showed decreased conduction velocity (Elbow-Wrist, 48 m/s).  The left median (across palm) sensory nerve showed prolonged distal peak latency (Wrist, 3.9 ms) and prolonged distal peak latency (Palm, 2.2 ms).  The left ulnar sensory nerve showed prolonged distal peak latency (4.0 ms), reduced amplitude (14.5 V), and decreased conduction velocity (Wrist-5th Digit, 35 m/s).  All remaining nerves (as indicated in the following tables) were within normal limits.    Left biceps and pronator teres showed very mild increased insertional activity and reduced recruitment.  All other examined muscles (as indicated in the following table) showed no evidence of electrical instability.    Impression: The above electrodiagnostic study is ABNORMAL and reveals evidence of a very mild double crush phenomenon with a mild to moderate left median nerve entrapment at the wrist (carpal tunnel syndrome) affecting sensory and motor components and very mild findings suggestive of C6 radiculopathy.   There is no significant electrodiagnostic evidence of any other focal nerve entrapment, brachial plexopathy or cervical radiculopathy.   Recommendations: 1.  Follow-up with referring physician. 2.  Continue current management of symptoms.  ___________________________ Laurence Spates FAAPMR Board Certified, American Board of Physical Medicine and Rehabilitation    Nerve Conduction Studies Anti Sensory Summary Table   Stim Site NR Peak (ms) Norm Peak (ms) P-T Amp (V) Norm P-T Amp Site1 Site2 Delta-P (ms) Dist (cm) Vel (m/s) Norm Vel (m/s)  Left Median Acr Palm Anti Sensory (2nd Digit)  Wrist    *3.9 <3.6 23.3 >10 Wrist Palm 1.7 0.0    Palm    *2.2 <2.0 4.6         Left Radial Anti Sensory (Base 1st Digit)  Wrist    2.7 <3.1 3.4  Wrist Base 1st Digit 2.7 0.0    Left Ulnar Anti Sensory (5th Digit)  Wrist    *4.0 <3.7 *14.5 >15.0 Wrist 5th Digit 4.0 14.0 *35 >38    Motor Summary Table   Stim Site NR Onset (ms) Norm Onset (ms) O-P Amp (mV) Norm O-P Amp Site1 Site2 Delta-0 (ms) Dist (cm) Vel (m/s) Norm Vel (m/s)  Left Median Motor (Abd Poll Brev)  Wrist    4.1 <4.2 7.3 >5 Elbow Wrist 5.0 24.0 *48 >50  Elbow    9.1  7.0         Left Ulnar Motor (Abd Dig Min)  Wrist    3.5 <4.2 12.8 >3 B Elbow Wrist 4.5 24.0 53 >53  B Elbow    8.0  11.2  A Elbow B Elbow 1.9 10.0 53 >53  A Elbow    9.9  11.0          EMG   Side Muscle Nerve Root Ins Act Fibs Psw Amp Dur Poly Recrt Int Fraser Din Comment  Left Abd Poll Brev Median C8-T1 Nml Nml Nml Nml Nml 0 Nml Nml   Left 1stDorInt Ulnar C8-T1 Nml Nml Nml Nml Nml 0 Nml Nml   Left PronatorTeres Median C6-7 INC Nml Nml Nml Nml 0 Nml Nml   Left Biceps Musculocut C5-6 INC Nml Nml Nml Nml 0 RDC Nml   Left Deltoid Axillary C5-6 Nml Nml Nml Nml Nml 0 Nml Nml     Nerve Conduction Studies Anti Sensory Left/Right Comparison   Stim Site L Lat (ms) R Lat (ms) L-R Lat (ms) L Amp (V) R Amp (V) L-R Amp (%) Site1 Site2 L Vel (m/s) R Vel (m/s) L-R Vel (m/s)  Median  Acr Palm Anti Sensory (2nd Digit)  Wrist *3.9   23.3   Wrist Palm     Palm *2.2   4.6         Radial Anti Sensory (Base 1st Digit)  Wrist 2.7   3.4   Wrist Base 1st Digit     Ulnar Anti Sensory (5th Digit)  Wrist *4.0   *14.5   Wrist 5th Digit *35     Motor Left/Right Comparison   Stim Site L Lat (ms) R Lat (ms) L-R Lat (ms) L Amp (mV) R Amp (mV) L-R Amp (%) Site1 Site2 L Vel (m/s) R Vel (m/s) L-R Vel (m/s)  Median Motor (Abd Poll Brev)  Wrist 4.1   7.3   Elbow Wrist *48    Elbow 9.1   7.0         Ulnar Motor (Abd Dig Min)  Wrist 3.5   12.8   B Elbow Wrist 53    B Elbow 8.0   11.2   A Elbow B Elbow 53    A Elbow 9.9   11.0            Waveforms:

## 2020-03-05 DIAGNOSIS — J441 Chronic obstructive pulmonary disease with (acute) exacerbation: Secondary | ICD-10-CM | POA: Diagnosis not present

## 2020-03-09 ENCOUNTER — Encounter: Payer: Self-pay | Admitting: Physical Medicine and Rehabilitation

## 2020-03-09 NOTE — Progress Notes (Signed)
Gregory Christensen - 69 y.o. male MRN [id number]  Date of birth: [birth year]  Office Visit Note: Visit Date: 03/04/2020 PCP: Leighton Ruff, MD Referred by: Leighton Ruff, MD  Subjective: Chief Complaint  Patient presents with  . Left Shoulder - Pain   HPI:  Gregory Christensen is a 69 y.o. male who comes in today at the request of Dr. Basil Dess for electrodiagnostic study of the Left upper extremities.  Patient is Right hand dominant.  He has also been recently seen by Dr. [name] Malta at the request of Dr. Louanne Skye.  Patient has had an MRI scan of his left shoulder that revealed high-grade partial thickness tear of the supraspinatus, low-grade partial tear of the infraspinatus, mild to moderate rotator cuff tendinosis, anterior labral tear, AC joint arthrosis.  His main complaint today is left shoulder pain.  He reports that it hurts mainly at night when he is asleep but this really exacerbates his pain.  It also hurts when he tries to lift his left arm up at the shoulder and bring it to his side.  He denies any real numbness tingling or paresthesias.  He has not had cervical spine MRI.  Dr. Louanne Skye was looking at electrodiagnostic study to rule out root injury.  His average pain is 7 out of 10 despite narcotic medication.  Patient is have pretty complicated medical course of COPD and DVT.  He has had no prior electrodiagnostic study to review.  ROS Otherwise per HPI.  Assessment & Plan: Visit Diagnoses:  1. Paresthesia of skin   2. Chronic left shoulder pain     Plan: Impression: The above electrodiagnostic study is ABNORMAL and reveals evidence of a very mild double crush phenomenon with a mild to moderate left median nerve entrapment at the wrist (carpal tunnel syndrome) affecting sensory and motor components and very mild findings suggestive of C6 radiculopathy.   There is no significant electrodiagnostic evidence of any other focal nerve entrapment, brachial plexopathy or cervical  radiculopathy.   Recommendations: 1.  Follow-up with referring physician. 2.  Continue current management of symptoms.  Meds & Orders: No orders of the defined types were placed in this encounter.   Orders Placed This Encounter  Procedures  . NCV with EMG (electromyography)    Follow-up: Return for  [name] Malta, MD and Basil Dess, MD as scheduled.   Procedures: No procedures performed  EMG & NCV Findings: Evaluation of the left median motor nerve showed decreased conduction velocity (Elbow-Wrist, 48 m/s).  The left median (across palm) sensory nerve showed prolonged distal peak latency (Wrist, 3.9 ms) and prolonged distal peak latency (Palm, 2.2 ms).  The left ulnar sensory nerve showed prolonged distal peak latency (4.0 ms), reduced amplitude (14.5 V), and decreased conduction velocity (Wrist-5th Digit, 35 m/s).  All remaining nerves (as indicated in the following tables) were within normal limits.    Left biceps and pronator teres showed very mild increased insertional activity and reduced recruitment.  All other examined muscles (as indicated in the following table) showed no evidence of electrical instability.    Impression: The above electrodiagnostic study is ABNORMAL and reveals evidence of a very mild double crush phenomenon with a mild to moderate left median nerve entrapment at the wrist (carpal tunnel syndrome) affecting sensory and motor components and very mild findings suggestive of C6 radiculopathy.   There is no significant electrodiagnostic evidence of any other focal nerve entrapment, brachial plexopathy or cervical radiculopathy.   Recommendations: 1.  Follow-up  with referring physician. 2.  Continue current management of symptoms.  ___________________________ Laurence Spates FAAPMR Board Certified, American Board of Physical Medicine and Rehabilitation    Nerve Conduction Studies Anti Sensory Summary Table   Stim Site NR Peak (ms) Norm Peak (ms) P-T Amp (V)  Norm P-T Amp Site1 Site2 Delta-P (ms) Dist (cm) Vel (m/s) Norm Vel (m/s)  Left Median Acr Palm Anti Sensory (2nd Digit)  Wrist    *3.9 <3.6 23.3 >10 Wrist Palm 1.7 0.0    Palm    *2.2 <2.0 4.6         Left Radial Anti Sensory (Base 1st Digit)  Wrist    2.7 <3.1 3.4  Wrist Base 1st Digit 2.7 0.0    Left Ulnar Anti Sensory (5th Digit)  Wrist    *4.0 <3.7 *14.5 >15.0 Wrist 5th Digit 4.0 14.0 *35 >38   Motor Summary Table   Stim Site NR Onset (ms) Norm Onset (ms) O-P Amp (mV) Norm O-P Amp Site1 Site2 Delta-0 (ms) Dist (cm) Vel (m/s) Norm Vel (m/s)  Left Median Motor (Abd Poll Brev)  Wrist    4.1 <4.2 7.3 >5 Elbow Wrist 5.0 24.0 *48 >50  Elbow    9.1  7.0         Left Ulnar Motor (Abd Dig Min)  Wrist    3.5 <4.2 12.8 >3 B Elbow Wrist 4.5 24.0 53 >53  B Elbow    8.0  11.2  A Elbow B Elbow 1.9 10.0 53 >53  A Elbow    9.9  11.0          EMG   Side Muscle Nerve Root Ins Act Fibs Psw Amp Dur Poly Recrt Int Fraser Din Comment  Left Abd Poll Brev Median C8-T1 Nml Nml Nml Nml Nml 0 Nml Nml   Left 1stDorInt Ulnar C8-T1 Nml Nml Nml Nml Nml 0 Nml Nml   Left PronatorTeres Median C6-7 INC Nml Nml Nml Nml 0 Nml Nml   Left Biceps Musculocut C5-6 INC Nml Nml Nml Nml 0 RDC Nml   Left Deltoid Axillary C5-6 Nml Nml Nml Nml Nml 0 Nml Nml     Nerve Conduction Studies Anti Sensory Left/Right Comparison   Stim Site L Lat (ms) R Lat (ms) L-R Lat (ms) L Amp (V) R Amp (V) L-R Amp (%) Site1 Site2 L Vel (m/s) R Vel (m/s) L-R Vel (m/s)  Median Acr Palm Anti Sensory (2nd Digit)  Wrist *3.9   23.3   Wrist Palm     Palm *2.2   4.6         Radial Anti Sensory (Base 1st Digit)  Wrist 2.7   3.4   Wrist Base 1st Digit     Ulnar Anti Sensory (5th Digit)  Wrist *4.0   *14.5   Wrist 5th Digit *35     Motor Left/Right Comparison   Stim Site L Lat (ms) R Lat (ms) L-R Lat (ms) L Amp (mV) R Amp (mV) L-R Amp (%) Site1 Site2 L Vel (m/s) R Vel (m/s) L-R Vel (m/s)  Median Motor (Abd Poll Brev)  Wrist 4.1   7.3   Elbow Wrist  *48    Elbow 9.1   7.0         Ulnar Motor (Abd Dig Min)  Wrist 3.5   12.8   B Elbow Wrist 53    B Elbow 8.0   11.2   A Elbow B Elbow 53    A Elbow 9.9   11.0  Waveforms:             Clinical History: No specialty comments available.     Objective:  VS:  HT:    WT:   BMI:     BP:   HR: bpm  TEMP: ( )  RESP:  Physical Exam Musculoskeletal:        General: No tenderness.     Comments: Inspection reveals no atrophy of the bilateral APB or FDI or hand intrinsics. There is no swelling, color changes, allodynia or dystrophic changes. There is 5 out of 5 strength in the bilateral wrist extension, finger abduction and long finger flexion. There is intact sensation to light touch in all dermatomal and peripheral nerve distributions. There is a negative Hoffmann's test bilaterally.  Painful range of motion of the left shoulder.  Skin:    General: Skin is warm and dry.     Findings: No erythema or rash.  Neurological:     General: No focal deficit present.     Mental Status: He is alert and oriented to person, place, and time.     Sensory: No sensory deficit.     Motor: No weakness or abnormal muscle tone.     Coordination: Coordination normal.     Gait: Gait normal.  Psychiatric:        Mood and Affect: Mood normal.        Behavior: Behavior normal.        Thought Content: Thought content normal.      Imaging: No results found.

## 2020-03-25 ENCOUNTER — Telehealth: Payer: Self-pay | Admitting: Orthopedic Surgery

## 2020-03-25 NOTE — Telephone Encounter (Signed)
Patient called.   He is requesting a progress update on surgery scheduling.  Said we were waiting on labwork from another doctor of his   Call back: [phone]

## 2020-03-29 ENCOUNTER — Telehealth: Payer: Self-pay | Admitting: Orthopedic Surgery

## 2020-03-29 NOTE — Telephone Encounter (Signed)
Pt would like a CB in regards to when he should stop taking his blood thinners for his surgery?  [phone]

## 2020-03-29 NOTE — Telephone Encounter (Signed)
Please advise. Thanks.  

## 2020-03-29 NOTE — Telephone Encounter (Signed)
Stop his eliquis 2 days beforehand

## 2020-03-29 NOTE — Telephone Encounter (Signed)
Spoke with patient and advised. Patient verbalized understanding.

## 2020-03-30 ENCOUNTER — Telehealth: Payer: Self-pay

## 2020-03-30 ENCOUNTER — Other Ambulatory Visit: Payer: Self-pay

## 2020-03-30 NOTE — Telephone Encounter (Signed)
Patient called he is requesting for you to call him. He didn't specify what he was calling for. Call [phone]

## 2020-03-30 NOTE — Telephone Encounter (Signed)
See below. IC patient. He would like a call back about where his surgery will be performed? He said he knew it was going to be on 04/12/20 but was unsure where it would be. Also had questions about when he would be doing his pre op labs? Please call patient to discuss.

## 2020-04-01 DIAGNOSIS — R351 Nocturia: Secondary | ICD-10-CM | POA: Diagnosis not present

## 2020-04-01 DIAGNOSIS — N401 Enlarged prostate with lower urinary tract symptoms: Secondary | ICD-10-CM | POA: Diagnosis not present

## 2020-04-04 DIAGNOSIS — J441 Chronic obstructive pulmonary disease with (acute) exacerbation: Secondary | ICD-10-CM | POA: Diagnosis not present

## 2020-04-06 ENCOUNTER — Encounter: Payer: Self-pay | Admitting: Specialist

## 2020-04-06 ENCOUNTER — Ambulatory Visit (INDEPENDENT_AMBULATORY_CARE_PROVIDER_SITE_OTHER): Payer: No Typology Code available for payment source | Admitting: Specialist

## 2020-04-06 VITALS — BP 134/76 | HR 87 | Ht 73.0 in | Wt 166.0 lb

## 2020-04-06 DIAGNOSIS — M75122 Complete rotator cuff tear or rupture of left shoulder, not specified as traumatic: Secondary | ICD-10-CM | POA: Diagnosis not present

## 2020-04-06 DIAGNOSIS — M25511 Pain in right shoulder: Secondary | ICD-10-CM

## 2020-04-06 DIAGNOSIS — M25512 Pain in left shoulder: Secondary | ICD-10-CM

## 2020-04-06 DIAGNOSIS — M4722 Other spondylosis with radiculopathy, cervical region: Secondary | ICD-10-CM

## 2020-04-06 DIAGNOSIS — R200 Anesthesia of skin: Secondary | ICD-10-CM | POA: Diagnosis not present

## 2020-04-06 DIAGNOSIS — G8929 Other chronic pain: Secondary | ICD-10-CM

## 2020-04-06 MED ORDER — TRAMADOL HCL 50 MG PO TABS: 100.0000 mg | ORAL_TABLET | Freq: Four times a day (QID) | ORAL | 0 refills | Status: DC | PRN

## 2020-04-06 NOTE — Progress Notes (Signed)
[place] [place] [address] Phone: [phone] Fax: [phone]      Your procedure is scheduled on 04/12/20  Report to [facility] Main Entrance "A" at 1:00 P.M., and check in at the Admitting office.  Call this number if you have problems the morning of surgery:  [phone]  Call [phone] if you have any questions prior to your surgery date Monday-Friday 8am-4pm    Remember:  Do not eat or drink after midnight the night before your surgery    Take these medicines the morning of surgery with A SIP OF WATER: amLODipine (NORVASC) gabapentin (NEURONTIN) pantoprazole (PROTONIX)  tamsulosin (FLOMAX)  If needed: acetaminophen (TYLENOL) albuterol (PROVENTIL HFA;VENTOLIN HFA) albuterol (PROVENTIL) budesonide-formoterol (SYMBICORT) fluticasone (FLONASE)  Follow your surgeon's instructions on when to stop Eliquis.  If no instructions were given by your surgeon then you will need to call the office to get those instructions.    As of today, STOP taking any Aspirin (unless otherwise instructed by your surgeon) Aleve, Naproxen, Ibuprofen, Motrin, Advil, Goody's, BC's, all herbal medications, fish oil, and all vitamins.                      Do not wear jewelry            Do not wear lotions, powders, colognes, or deodorant.            Do not shave 48 hours prior to surgery.  Men may shave face and neck.            Do not bring valuables to the hospital.            [facility] is not responsible for any belongings or valuables.  Do NOT Smoke (Tobacco/Vaping) or drink Alcohol 24 hours prior to your procedure If you use a CPAP at night, you may bring all equipment for your overnight stay.   Contacts, glasses, dentures or bridgework may not be worn into surgery.      For patients admitted to the hospital, discharge time will be determined by your treatment team.   Patients discharged the day of  surgery will not be allowed to drive home, and someone needs to stay with them for 24 hours.    Special instructions:   [facility]- Preparing For Surgery                                   [facility]- Preparing for Total Shoulder Arthroplasty   Before surgery, you can play an important role. Because skin is not sterile, your skin needs to be as free of germs as possible. You can reduce the number of germs on your skin by using the following products. . Benzoyl Peroxide Gel o Reduces the number of germs present on the skin o Applied twice a day to shoulder area starting two days before surgery   . Chlorhexidine Gluconate (CHG) Soap o An antiseptic cleaner that kills germs and bonds with the skin to continue killing germs even after washing o Used for showering the night before surgery and morning of surgery   Oral Hygiene is also important to reduce your risk of infection.  Remember - BRUSH YOUR TEETH THE MORNING OF SURGERY WITH YOUR REGULAR TOOTHPASTE  ==================================================================  Please follow these instructions carefully:  BENZOYL PEROXIDE 5% GEL  Please do not use if you have an allergy to benzoyl peroxide.   If your skin becomes reddened/irritated stop using the benzoyl peroxide.  Starting two days before surgery, apply as follows: 1. Apply benzoyl peroxide in the morning and at night. Apply after taking a shower. If you are not taking a shower clean entire shoulder front, back, and side along with the armpit with a clean wet washcloth.  2. Place a quarter-sized dollop on your shoulder and rub in thoroughly, making sure to cover the front, back, and side of your shoulder, along with the armpit.   2 days before ____ AM   ____ PM              1 day before ____ AM   ____ PM                            3.  Do this twice a day for two days.  (Last application is the night before surgery, AFTER using the CHG  soap as described below).  4. Do NOT apply benzoyl peroxide gel on the day of surgery.  CHLORHEXIDINE GLUCONATE (CHG) SOAP  Please do not use if you have an allergy to CHG or antibacterial soaps. If your skin becomes reddened/irritated stop using the CHG.   Do not shave (including legs and underarms) for at least 48 hours prior to first CHG shower. It is OK to shave your face.  Starting the night before surgery, use CHG soap as follows:  1. Shower the NIGHT BEFORE SURGERY and MORNING OF SURGERY with CHG.  2. If you choose to wash your hair, wash your hair first as usual with your normal shampoo.  3. After shampooing, rinse your hair and body thoroughly to remove the shampoo.  4. Use CHG as you would any other liquid soap.  You can apply CHG directly to the skin and wash gently with a scrungie or a clean washcloth.  5. Apply the CHG soap to your body ONLY FROM THE NECK DOWN.  Do not use on open wounds or open sores.  Avoid contact with your eyes, ears, mouth, and genitals (private parts).  Wash face and genitals (private parts) with your normal soap.  6. Wash thoroughly, paying special attention to the area where your surgery will be performed.  7. Thoroughly rinse your body with warm water from the neck down.  8. DO NOT shower/wash with your normal soap after using and rinsing off the CHG soap.   9. Pat yourself dry with a CLEAN TOWEL.   10.  Apply benzoyl peroxide.   11. Wear CLEAN PAJAMAS to bed the night before surgery; wear comfortable clothes the morning of surgery.  12. Place CLEAN SHEETS on your bed the night of your first shower and DO NOT SLEEP WITH PETS.  Day of Surgery: Shower as above Do not apply any deodorants/lotions.  Please wear clean clothes to the hospital/surgery center.   Remember to brush your teeth WITH YOUR REGULAR TOOTHPASTE.    Before surgery, you can play an important role. Because skin is not sterile, your skin needs to be as free of germs as  possible. You can reduce the number of germs on your skin by washing with CHG (chlorahexidine gluconate) Soap before surgery.  CHG is  an antiseptic cleaner which kills germs and bonds with the skin to continue killing germs even after washing.    Oral Hygiene is also important to reduce your risk of infection.  Remember - BRUSH YOUR TEETH THE MORNING OF SURGERY WITH YOUR REGULAR TOOTHPASTE  Please do not use if you have an allergy to CHG or antibacterial soaps. If your skin becomes reddened/irritated stop using the CHG.  Do not shave (including legs and underarms) for at least 48 hours prior to first CHG shower. It is OK to shave your face.  Please follow these instructions carefully.   1. Shower the NIGHT BEFORE SURGERY and the MORNING OF SURGERY with CHG Soap.   2. If you chose to wash your hair, wash your hair first as usual with your normal shampoo.  3. After you shampoo, rinse your hair and body thoroughly to remove the shampoo.  4. Use CHG as you would any other liquid soap. You can apply CHG directly to the skin and wash gently with a scrungie or a clean washcloth.   5. Apply the CHG Soap to your body ONLY FROM THE NECK DOWN.  Do not use on open wounds or open sores. Avoid contact with your eyes, ears, mouth and genitals (private parts). Wash Face and genitals (private parts)  with your normal soap.   6. Wash thoroughly, paying special attention to the area where your surgery will be performed.  7. Thoroughly rinse your body with warm water from the neck down.  8. DO NOT shower/wash with your normal soap after using and rinsing off the CHG Soap.  9. Pat yourself dry with a CLEAN TOWEL.  10. Wear CLEAN PAJAMAS to bed the night before surgery  11. Place CLEAN SHEETS on your bed the night of your first shower and DO NOT SLEEP WITH PETS.   Day of Surgery: Wear Clean/Comfortable clothing the morning of surgery Do not apply any deodorants/lotions.   Remember to brush your teeth  WITH YOUR REGULAR TOOTHPASTE.   Please read over the following fact sheets that you were given.

## 2020-04-06 NOTE — Progress Notes (Signed)
Office Visit Note   Patient: Gregory Christensen           Date of Birth: 1951/02/10           MRN: [id number] Visit Date: 04/06/2020              Requested by: Leighton Ruff, MD [place],  [place] [address] PCP: Leighton Ruff, MD   Assessment & Plan: Visit Diagnoses:  1. Complete tear of left rotator cuff, unspecified whether traumatic   2. Left arm numbness   3. Chronic pain of both shoulders   4. Other spondylosis with radiculopathy, cervical region    Very mild findings on EMG/NCV. This suggests that we should follow the cervical spondylosis and carpal tunnel for any  Worsening. His left shoulder is likely the major source of pain and with intervention he is likely to see the most improvement.  I will follow up with him in 3 months.  Recommend proceeding with the left shoulder intervention. Tramadol renewed to help with pain prior to surgery next week.  Plan: Avoid overhead lifting and overhead use of the arms. Do not lift greater than 5-15 lbs. Adjust head rest in vehicle to prevent hyperextension if rear ended. Take extra precautions to avoid falling.  Follow-Up Instructions: Return in about 3 months (around 07/07/2020).   Orders:  No orders of the defined types were placed in this encounter.  Meds ordered this encounter  Medications  . traMADol (ULTRAM) 50 MG tablet    Sig: Take 2 tablets (100 mg total) by mouth every 6 (six) hours as needed for moderate pain.    Dispense:  40 tablet    Refill:  0      Procedures: No procedures performed   Clinical Data: No additional findings.   Subjective: Chief Complaint  Patient presents with  . Left Arm - Follow-up, Pain  . Right Arm - Follow-up, Pain    69 year old right handed male with history of severe left shoulder pain. He has been through recent EMG/NCV of the left arm and the studies have returned with very mild double crush findings both very mild C6 changes and very mild left carpal  tunnel syndrome. His pain in the left shoulder is severe and he is asking for something to relieve the pain.  No bowel or bladder dysfunction. He is able to stand and walk without difficulty. Painful ROM of the left shoulder. 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 Has survives bout of COVID with short hospital stay and has been vaccinated.    Review of Systems  Constitutional: Negative.   HENT: Negative.   Eyes: Negative.   Respiratory: Negative.   Cardiovascular: Negative.   Gastrointestinal: Negative.   Endocrine: Negative.   Genitourinary: Negative.   Musculoskeletal: Negative.   Skin: Negative.   Allergic/Immunologic: Negative.   Neurological: Negative.   Hematological: Negative.   Psychiatric/Behavioral: Negative.      Objective: Vital Signs: BP 134/76 (BP Location: Left Arm, Patient Position: Sitting)   Pulse 87   Ht 6\' 1"  (1.854 m)   Wt 166 lb (75.3 kg)   BMI 21.90 kg/m   Physical Exam Constitutional:      Appearance: He is well-developed.  HENT:     Head: Normocephalic and atraumatic.  Eyes:     Pupils: Pupils are equal, round, and reactive to light.  Pulmonary:     Effort: Pulmonary effort is  normal.     Breath sounds: Normal breath sounds.  Abdominal:     General: Bowel sounds are normal.     Palpations: Abdomen is soft.  Musculoskeletal:     Cervical back: Normal range of motion and neck supple.     Lumbar back: Negative right straight leg raise test and negative left straight leg raise test.  Skin:    General: Skin is warm and dry.  Neurological:     Mental Status: He is alert and oriented to person, place, and time.  Psychiatric:        Behavior: Behavior normal.        Thought Content: Thought content normal.        Judgment: Judgment normal.     Back Exam   Tenderness  The patient is experiencing tenderness in the cervical.  Range of Motion  Extension: abnormal   Flexion: abnormal  Lateral bend right: abnormal  Lateral bend left: abnormal  Rotation right: abnormal  Rotation left: abnormal   Muscle Strength  Right Quadriceps:  5/5  Left Quadriceps:  5/5  Right Hamstrings:  5/5  Left Hamstrings:  5/5   Tests  Straight leg raise right: negative Straight leg raise left: negative  Reflexes  Patellar: normal Achilles: normal Babinski's sign: normal   Other  Toe walk: normal Heel walk: normal      Specialty Comments:  No specialty comments available.  Imaging: No results found.   PMFS History: Patient Active Problem List   Diagnosis Date Noted  . Benign neoplasm of transverse colon   . Benign neoplasm of descending colon   . Benign neoplasm of rectum   . Heme positive stool   . Iron deficiency anemia due to chronic blood loss   . Melena   . Abdominal pain, chronic, right lower quadrant   . Chronic anticoagulation   . Symptomatic anemia 09/24/2019  . Pneumonia due to severe acute respiratory syndrome coronavirus 2 (SARS-CoV-2) 02/28/2019  . Renal mass 10/31/2018  . Chest pressure 10/15/2018  . Coronary artery calcification 10/15/2018  . Respiratory failure, acute-on-chronic ([place]) 08/03/2018  . Acute deep vein thrombosis (DVT) of left lower extremity ([place])   . Acute hypoxemic respiratory failure ([place])   . Pulmonary embolism ([place]) 05/31/2018  . COPD exacerbation ([place]) 10/18/2017  . Hypoxia   . Respiratory distress   . Essential hypertension    Past Medical History:  Diagnosis Date  . Acute deep vein thrombosis (DVT) of left lower extremity ([place])   . Acute hypoxemic respiratory failure ([place])   . Anxiety   . Aortic atherosclerosis ([place])   . Arthritis   . Benign prostatic hyperplasia 08/28/2018   UNSPECIFIED WHETHER LOWER URINARY TRACT SYMPTOMS PRESENT  . CAD (coronary artery disease)   . Chronic back pain   . Chronic hip pain   . COPD exacerbation ([place]) 10/18/2017  . Emphysema lung ([place])   . Essential  hypertension   . GERD (gastroesophageal reflux disease)   . Gout   . Gross hematuria 09/08/2018  . History of tobacco abuse   . Hypoxia   . Pulmonary embolism ([place]) 05/31/2018  . Renal mass    CONCERNING FOR RENAL CELL CARCINOMA DR. Lovena Neighbours  . Respiratory distress   . Respiratory failure, acute-on-chronic ([place]) 08/03/2018  . Seasonal allergies   . Sleep apnea     Family History  Problem Relation Age of Onset  . Diabetes Mother   . Hypertension Sister   . Hypertension Brother  Past Surgical History:  Procedure Laterality Date  . COLONOSCOPY WITH PROPOFOL N/A 09/27/2019   Procedure: COLONOSCOPY WITH PROPOFOL;  Surgeon: Irene Shipper, MD;  Location: [facility] END[facility];  Service: Endoscopy;  Laterality: N/A;  . CYSTOSCOPY/URETEROSCOPY/HOLMIUM LASER/STENT PLACEMENT Left 10/31/2018   Procedure: CYSTOSCOPY/URETEROSCOPY;  Surgeon: Ceasar Mons, MD;  Location: [facility];  Service: Urology;  Laterality: Left;  . ESOPHAGOGASTRODUODENOSCOPY (EGD) WITH PROPOFOL N/A 09/27/2019   Procedure: ESOPHAGOGASTRODUODENOSCOPY (EGD) WITH PROPOFOL;  Surgeon: Irene Shipper, MD;  Location: [facility];  Service: Endoscopy;  Laterality: N/A;  . [place]  . HYDROCELE EXCISION Left 05/13/2019   Procedure: HYDROCELECTOMY ADULT;  Surgeon: Ceasar Mons, MD;  Location: [facility];  Service: Urology;  Laterality: Left;  Marland Kitchen MANDIBLE SURGERY  1976   EXTENSIVE  . POLYPECTOMY  09/27/2019   Procedure: POLYPECTOMY;  Surgeon: Irene Shipper, MD;  Location: [facility];  Service: Endoscopy;;  . ROBOT ASSITED LAPAROSCOPIC NEPHROURETERECTOMY Left 10/31/2018   Procedure: XI ROBOT ASSITED LAPAROSCOPIC NEPHROURETERECTOMY;  Surgeon: Ceasar Mons, MD;  Location: [facility];  Service: Urology;  Laterality: Left;  ONLY NEEDS 240 MIN FOR ALL PROCEDURES   Social History   Occupational History  . Not on file  Tobacco Use  . Smoking status: Former Smoker    Packs/day: 1.00    Years: 26.00    Pack  years: 26.00    Types: Cigarettes    Quit date: 10/18/2017    Years since quitting: 2.4  . Smokeless tobacco: Never Used  Vaping Use  . Vaping Use: Never used  Substance and Sexual Activity  . Alcohol use: Not Currently    Comment: 2 beers a day   . Drug use: Not Currently    Types: Flunitrazepam  . Sexual activity: Not on file

## 2020-04-06 NOTE — Patient Instructions (Addendum)
Avoid overhead lifting and overhead use of the arms. Do not lift greater than 5-15 lbs. Adjust head rest in vehicle to prevent hyperextension if rear ended. Take extra precautions to avoid falling. Very mild findings on EMG/NCV. This suggests that we should follow the cervical spondylosis and carpal tunnel for any  Worsening. His left shoulder is likely the major source of pain and with intervention he is likely to see the most improvement.  I will follow up with him in 3 months.  Recommend proceeding with the left shoulder intervention. Tramadol renewed to help with pain prior to surgery next week.

## 2020-04-07 ENCOUNTER — Other Ambulatory Visit: Payer: Self-pay

## 2020-04-07 ENCOUNTER — Encounter (HOSPITAL_COMMUNITY): Payer: Self-pay | Admitting: Orthopedic Surgery

## 2020-04-07 ENCOUNTER — Encounter (HOSPITAL_COMMUNITY)
Admission: RE | Admit: 2020-04-07 | Discharge: 2020-04-07 | Disposition: A | Discharge status: Home / Self Care | Payer: No Typology Code available for payment source | Source: Ambulatory Visit | Attending: Orthopedic Surgery | Admitting: Orthopedic Surgery

## 2020-04-07 ENCOUNTER — Encounter (HOSPITAL_COMMUNITY): Payer: Self-pay

## 2020-04-07 DIAGNOSIS — Z01818 Encounter for other preprocedural examination: Secondary | ICD-10-CM | POA: Diagnosis not present

## 2020-04-07 HISTORY — DX: Anemia, unspecified: D64.9

## 2020-04-07 HISTORY — DX: Malignant (primary) neoplasm, unspecified: C80.1

## 2020-04-07 HISTORY — DX: Insomnia, unspecified: G47.00

## 2020-04-07 HISTORY — DX: Pneumonia, unspecified organism: J18.9

## 2020-04-07 HISTORY — DX: Dyspnea, unspecified: R06.00

## 2020-04-07 HISTORY — DX: Constipation, unspecified: K59.00

## 2020-04-07 HISTORY — DX: Personal history of urinary calculi: Z87.442

## 2020-04-07 LAB — CBC
HCT: 43.5 % (ref 39.0–52.0)
Hemoglobin: 14 g/dL (ref 13.0–17.0)
MCH: 30.6 pg (ref 26.0–34.0)
MCHC: 32.2 g/dL (ref 30.0–36.0)
MCV: 95 fL (ref 80.0–100.0)
Platelets: 219 10*3/uL (ref 150–400)
RBC: 4.58 MIL/uL (ref 4.22–5.81)
RDW: 14.9 % (ref 11.5–15.5)
WBC: 7.6 10*3/uL (ref 4.0–10.5)
nRBC: 0 % (ref 0.0–0.2)

## 2020-04-07 LAB — BASIC METABOLIC PANEL
Anion gap: 10 (ref 5–15)
BUN: 21 mg/dL (ref 8–23)
CO2: 23 mmol/L (ref 22–32)
Calcium: 9 mg/dL (ref 8.9–10.3)
Chloride: 102 mmol/L (ref 98–111)
Creatinine, Ser: 1.92 mg/dL — ABNORMAL HIGH (ref 0.61–1.24)
GFR calc non Af Amer: 35 mL/min — ABNORMAL LOW (ref >60)
Glucose, Bld: 90 mg/dL (ref 70–99)
Potassium: 4.3 mmol/L (ref 3.5–5.1)
Sodium: 135 mmol/L (ref 135–145)

## 2020-04-07 LAB — HEMOGLOBIN A1C
Hgb A1c MFr Bld: 5.5 % (ref 4.8–5.6)
Mean Plasma Glucose: 111.15 mg/dL

## 2020-04-07 LAB — GLUCOSE, CAPILLARY: Glucose-Capillary: 103 mg/dL — ABNORMAL HIGH (ref 70–99)

## 2020-04-07 NOTE — Progress Notes (Addendum)
PCP - [facility]  records requested   Cardiologist - none  Chest x-ray - na  Pulmonologist- [facility] Records requested  EKG - requested from [place]  Stress Test -   ECHO -   Cardiac Cath -   A  Sleep Study - no CPAP - no  LABS-CBC, BMP, A1C  ASA-none Eliquis- last dose to be Saturday evening  ERAS-yes- nothing to drinkafter 12:45- pre-surgery Ensure  HA1C-5.5- 04/07/20 Fasting Blood Sugar - does not check- prediabetes Checks Blood Sugar __0___ times a day  Anesthesia-Mr.Corkins denies chest pain- he does frequently get short of breath. Patient wears 2L 24/ 7, patient is seating in a wheelchair and is not short of breath at this time.  Pt denies having chest pain, sob, or fever at this time. All instructions explained to the pt, with a verbal understanding of the material. Pt agrees to go over the instructions while at home for a better understanding. Pt also instructed to self quarantine after being tested for COVID-19. The opportunity to ask questions was provided.

## 2020-04-07 NOTE — Progress Notes (Addendum)
Your procedure is scheduled on 04/12/20  Report to [facility] Main Entrance "A" at 1:00 P.M., and check in at the Admitting office.              Your surgery or procedure is scheduled to begin at 3:00 PM  Call this number if you have problems the morning of surgery:  [phone]  Call [phone] if you have any questions prior to your surgery date Monday-Friday 8am-4pm   Remember:  Do not eat after midnight the night before your surgery You may drink clear liquids until 3 hours prior to surgery 12:45 PM Carbonated Beverages, Water, Clear Tea, Black Coffee only, Juice (non-citric and without pulp), Gatorade, Plain Jell-O  only, Plain Popsicles only.           Please drink the Pre- Surgery Ensure between 1200 noon and 12:45 PM.           Do Not drink any fluids after midnight.  Take these medicines the morning of surgery with A SIP OF WATER: amLODipine (NORVASC) gabapentin (NEURONTIN) pantoprazole (PROTONIX)  tamsulosin (FLOMAX)  If needed: acetaminophen (TYLENOL) albuterol (PROVENTIL HFA;VENTOLIN HFA) albuterol (PROVENTIL) budesonide-formoterol (SYMBICORT) fluticasone (FLONASE) Eye drops Tramadol  Follow your surgeon's instructions on when to stop Eliquis.  If no instructions were given by your surgeon then you will need to call the office to get those instructions.    As of today, STOP taking any Aspirin (unless otherwise instructed by your surgeon) Aleve, Naproxen, Ibuprofen, Motrin, Advil, Goody's, BC's, all herbal medications, fish oil, and all vitamins.       Special instructions:   [facility] [facility]- Preparing For Surgery                              Before surgery, you can play an important role. Because skin is not sterile, your skin needs to be as free of germs as possible. You can reduce the number of germs on your skin by washing with CHG (chlorahexidine gluconate) Soap before surgery.  CHG is an antiseptic cleaner which kills germs and bonds with the skin to  continue killing germs even after washing.    Oral Hygiene is also important to reduce your risk of infection.  Remember - BRUSH YOUR TEETH THE MORNING OF SURGERY WITH YOUR REGULAR TOOTHPASTE  Please do not use if you have an allergy to CHG or antibacterial soaps. If your skin becomes reddened/irritated stop using the CHG.  Do not shave (including legs and underarms) for at least 48 hours prior to first CHG shower. It is OK to shave your face.  Please follow these instructions carefully.   1. Shower the NIGHT BEFORE SURGERY and the MORNING OF SURGERY with CHG Soap.   2. If you chose to wash your hair, wash your hair first as usual with your normal shampoo.  3. After you shampoo, rinse your hair and body thoroughly to remove the shampoo.  4. Use CHG as you would any other liquid soap. You can apply CHG directly to the skin and wash gently with a scrungie or a clean washcloth.   5. Apply the CHG Soap to your body ONLY FROM THE NECK DOWN.  Do not use on open wounds or open sores. Avoid contact with your eyes, ears, mouth and genitals (private parts). Wash Face and genitals (private parts)  with your normal soap.   6. Wash thoroughly, paying special attention to the area where your  surgery will be performed.  7. Thoroughly rinse your body with warm water from the neck down.  8. DO NOT shower/wash with your normal soap after using and rinsing off the CHG Soap.  9. Pat yourself dry with a CLEAN TOWEL.  10. Wear CLEAN PAJAMAS to bed the night before surgery  Place CLEAN SHEETS on your bed the night of your first shower and DO NOT SLEEP WITH PETS.  Day of Surgery: Shower as instructed above Do not wear jewelry Do not wear lotions, powders, colognes, or deodorant. Men may shave face and neck..      Wear Clean/Comfortable clothing the morning of surgery Remember to brush your teeth WITH YOUR REGULAR TOOTHPASTE.   Do not bring valuaes to the hospital. [facility] is not responsible for  any belongings or valuables.   Please read over the following fact sheets that you were given.

## 2020-04-08 ENCOUNTER — Other Ambulatory Visit (HOSPITAL_COMMUNITY)
Admission: RE | Admit: 2020-04-08 | Discharge: 2020-04-08 | Disposition: A | Discharge status: Home / Self Care | Payer: No Typology Code available for payment source | Source: Ambulatory Visit | Attending: Orthopedic Surgery | Admitting: Orthopedic Surgery

## 2020-04-08 DIAGNOSIS — Z01818 Encounter for other preprocedural examination: Secondary | ICD-10-CM | POA: Insufficient documentation

## 2020-04-08 DIAGNOSIS — Z20822 Contact with and (suspected) exposure to covid-19: Secondary | ICD-10-CM | POA: Diagnosis not present

## 2020-04-08 LAB — SARS CORONAVIRUS 2 (TAT 6-24 HRS): SARS Coronavirus 2: NEGATIVE

## 2020-04-08 NOTE — Progress Notes (Signed)
Anesthesia Chart Review:  Pertinent hx includes tobacco abuse, HTN, DVT, PE (11/19 on Eliquis), GERD, COPD (on 2L home O2).  Patient had cardiac evaluation with Dr. Acie Fredrickson in 2020.  Last seen 02/25/2019.  Per note, "Coronary artery calcifications:  incidentally  found to have coronary artery calcifications by CT scan.  I saw him as a virtual visit several months ago prior to kidney surgery.  Because of his chronic shortness of breath and the coronary calcifications we performed a Lexiscan Myoview study which revealed no evidence of ischemia and normal left ventricular function.  He was deemed to be low risk for his surgery.  He has had a surgery and did not have any cardiac complications.Marland KitchenMarland KitchenAt this point he will return to see his primary medical doctor.  I have offered to see him on an as-needed basis but at this point I do not think there is much that we have to offer on a regular basis"  Patient reports last dose Eliquis 04/09/2020.  Pt has COPD followed by pulmonologist Dr. Gwenette Greet at [facility]. He is on 2 L supplemental O2 continuous. He reports he is at baseline. He has stable DOE. No SOB at rest. Denies any recent exacerbations.   Patient had hydrocelectomy 68/37/2902 without complication.  CKD status post left laparoscopic nephroureterectomy.  Baseline creatinine appears to be around 1.8.  Creatinine 1.92 on preop labs.  Remainder of preop labs unremarkable.  EKG 09/24/19: Sinus rhythm. Rate 83. Nonspecific intraventricular conduction delay  Nuclear stress 10/17/18:  Nuclear stress EF: 71%.  Normal perfusion with minimal soft tissue attenuation. No ischemia or scar  This is a low risk study.  TTE 06/01/18: - Left ventricle: The cavity size was normal. There was mild focal  basal hypertrophy of the septum. Systolic function was normal.  The estimated ejection fraction was in the range of 55% to 60%.  Wall motion was normal; there were no regional wall motion  abnormalities. Doppler  parameters are consistent with abnormal  left ventricular relaxation (grade 1 diastolic dysfunction).  - Right ventricle: The cavity size was normal. Wall thickness was  normal.    Wynonia Musty [facility] Short Stay Center/Anesthesiology Phone [phone] 04/08/2020 11:10 AM

## 2020-04-08 NOTE — Anesthesia Preprocedure Evaluation (Addendum)
Anesthesia Evaluation  Patient identified by MRN, date of birth, ID band Patient awake    Reviewed: Allergy & Precautions, H&P , NPO status , Patient's Chart, lab work & pertinent test results  Airway Mallampati: II  TM Distance: >3 FB Neck ROM: Full    Dental no notable dental hx. (+) Edentulous Upper, Partial Lower, Dental Advisory Given   Pulmonary shortness of breath, sleep apnea , COPD,  COPD inhaler and oxygen dependent, Current SmokerPatient did not abstain from smoking.,    Pulmonary exam normal breath sounds clear to auscultation       Cardiovascular hypertension, Pt. on medications + CAD   Rhythm:Regular Rate:Normal     Neuro/Psych Anxiety negative neurological ROS     GI/Hepatic Neg liver ROS, GERD  ,  Endo/Other  negative endocrine ROS  Renal/GU negative Renal ROS  negative genitourinary   Musculoskeletal  (+) Arthritis , Osteoarthritis,    Abdominal   Peds  Hematology  (+) Blood dyscrasia, anemia ,   Anesthesia Other Findings   Reproductive/Obstetrics negative OB ROS                           Anesthesia Physical Anesthesia Plan  ASA: III  Anesthesia Plan: General   Post-op Pain Management:    Induction: Intravenous  PONV Risk Score and Plan: 2 and Ondansetron, Dexamethasone and Midazolam  Airway Management Planned: Oral ETT  Additional Equipment:   Intra-op Plan:   Post-operative Plan: Extubation in OR  Informed Consent: I have reviewed the patients History and Physical, chart, labs and discussed the procedure including the risks, benefits and alternatives for the proposed anesthesia with the patient or authorized representative who has indicated his/her understanding and acceptance.     Dental advisory given  Plan Discussed with: CRNA  Anesthesia Plan Comments: (PAT note by Karoline Caldwell, PA-C: Pertinent hx includes tobacco abuse, HTN, DVT, PE (11/19 on  Eliquis), GERD, COPD (on 2L home O2).  Patient had cardiac evaluation with Dr. Acie Fredrickson in 2020.  Last seen 02/25/2019.  Per note, "Coronary artery calcifications:  incidentally  found to have coronary artery calcifications by CT scan.  I saw him as a virtual visit several months ago prior to kidney surgery.  Because of his chronic shortness of breath and the coronary calcifications we performed a Lexiscan Myoview study which revealed no evidence of ischemia and normal left ventricular function.  He was deemed to be low risk for his surgery.  He has had a surgery and did not have any cardiac complications.Marland KitchenMarland KitchenAt this point he will return to see his primary medical doctor.  I have offered to see him on an as-needed basis but at this point I do not think there is much that we have to offer on a regular basis"  Patient reports last dose Eliquis 04/09/2020.  Pt has COPD followed by pulmonologist Dr. Gwenette Greet at [facility]. He is on 2 L supplemental O2 continuous. He reports he is at baseline. He has stable DOE. No SOB at rest. Denies any recent exacerbations.   Patient had hydrocelectomy 60/45/4098 without complication.  CKD status post left laparoscopic nephroureterectomy.  Baseline creatinine appears to be around 1.8.  Creatinine 1.92 on preop labs.  Remainder of preop labs unremarkable.  EKG 09/24/19: Sinus rhythm. Rate 83. Nonspecific intraventricular conduction delay  Nuclear stress 10/17/18: Nuclear stress EF: 71%. Normal perfusion with minimal soft tissue attenuation. No ischemia or scar This is a low risk study.  TTE 06/01/18: -  Left ventricle: The cavity size was normal. There was mild focal  basal hypertrophy of the septum. Systolic function was normal.  The estimated ejection fraction was in the range of 55% to 60%.  Wall motion was normal; there were no regional wall motion  abnormalities. Doppler parameters are consistent with abnormal  left ventricular relaxation (grade 1 diastolic  dysfunction).  - Right ventricle: The cavity size was normal. Wall thickness was  normal.   )       Anesthesia Quick Evaluation

## 2020-04-12 ENCOUNTER — Encounter (HOSPITAL_COMMUNITY): Payer: Self-pay | Admitting: Orthopedic Surgery

## 2020-04-12 ENCOUNTER — Ambulatory Visit (HOSPITAL_COMMUNITY)
Admission: RE | Admit: 2020-04-12 | Discharge: 2020-04-12 | Disposition: A | Discharge status: Home / Self Care | Payer: No Typology Code available for payment source | Attending: Orthopedic Surgery | Admitting: Orthopedic Surgery

## 2020-04-12 ENCOUNTER — Encounter (HOSPITAL_COMMUNITY)
Admission: RE | Disposition: A | Discharge status: Home / Self Care | Payer: Self-pay | Source: Home / Self Care | Attending: Orthopedic Surgery

## 2020-04-12 ENCOUNTER — Other Ambulatory Visit: Payer: Self-pay

## 2020-04-12 ENCOUNTER — Ambulatory Visit (HOSPITAL_COMMUNITY): Payer: No Typology Code available for payment source | Admitting: Physician Assistant

## 2020-04-12 ENCOUNTER — Ambulatory Visit (HOSPITAL_COMMUNITY): Payer: No Typology Code available for payment source | Admitting: Certified Registered"

## 2020-04-12 DIAGNOSIS — F17210 Nicotine dependence, cigarettes, uncomplicated: Secondary | ICD-10-CM | POA: Insufficient documentation

## 2020-04-12 DIAGNOSIS — I2510 Atherosclerotic heart disease of native coronary artery without angina pectoris: Secondary | ICD-10-CM | POA: Diagnosis not present

## 2020-04-12 DIAGNOSIS — Z7951 Long term (current) use of inhaled steroids: Secondary | ICD-10-CM | POA: Diagnosis not present

## 2020-04-12 DIAGNOSIS — Z86711 Personal history of pulmonary embolism: Secondary | ICD-10-CM | POA: Diagnosis not present

## 2020-04-12 DIAGNOSIS — M75102 Unspecified rotator cuff tear or rupture of left shoulder, not specified as traumatic: Secondary | ICD-10-CM | POA: Insufficient documentation

## 2020-04-12 DIAGNOSIS — I10 Essential (primary) hypertension: Secondary | ICD-10-CM | POA: Insufficient documentation

## 2020-04-12 DIAGNOSIS — M7552 Bursitis of left shoulder: Secondary | ICD-10-CM | POA: Insufficient documentation

## 2020-04-12 DIAGNOSIS — K219 Gastro-esophageal reflux disease without esophagitis: Secondary | ICD-10-CM | POA: Diagnosis not present

## 2020-04-12 DIAGNOSIS — M75122 Complete rotator cuff tear or rupture of left shoulder, not specified as traumatic: Secondary | ICD-10-CM | POA: Diagnosis not present

## 2020-04-12 DIAGNOSIS — Z9981 Dependence on supplemental oxygen: Secondary | ICD-10-CM | POA: Diagnosis not present

## 2020-04-12 DIAGNOSIS — Z7901 Long term (current) use of anticoagulants: Secondary | ICD-10-CM | POA: Diagnosis not present

## 2020-04-12 DIAGNOSIS — R7303 Prediabetes: Secondary | ICD-10-CM | POA: Diagnosis not present

## 2020-04-12 DIAGNOSIS — G4730 Sleep apnea, unspecified: Secondary | ICD-10-CM | POA: Diagnosis not present

## 2020-04-12 DIAGNOSIS — J439 Emphysema, unspecified: Secondary | ICD-10-CM | POA: Diagnosis not present

## 2020-04-12 DIAGNOSIS — Z86718 Personal history of other venous thrombosis and embolism: Secondary | ICD-10-CM | POA: Diagnosis not present

## 2020-04-12 DIAGNOSIS — Z79899 Other long term (current) drug therapy: Secondary | ICD-10-CM | POA: Insufficient documentation

## 2020-04-12 DIAGNOSIS — X58XXXA Exposure to other specified factors, initial encounter: Secondary | ICD-10-CM | POA: Insufficient documentation

## 2020-04-12 DIAGNOSIS — S43432A Superior glenoid labrum lesion of left shoulder, initial encounter: Secondary | ICD-10-CM | POA: Insufficient documentation

## 2020-04-12 HISTORY — DX: Dependence on supplemental oxygen: Z99.81

## 2020-04-12 HISTORY — DX: Unspecified intracranial injury with loss of consciousness of unspecified duration, initial encounter: S06.9X9A

## 2020-04-12 HISTORY — PX: SHOULDER ARTHROSCOPY WITH ROTATOR CUFF REPAIR: SHX5685

## 2020-04-12 HISTORY — DX: Prediabetes: R73.03

## 2020-04-12 LAB — GLUCOSE, CAPILLARY: Glucose-Capillary: 132 mg/dL — ABNORMAL HIGH (ref 70–99)

## 2020-04-12 SURGERY — ARTHROSCOPY, SHOULDER, WITH ROTATOR CUFF REPAIR
Anesthesia: General | Laterality: Left

## 2020-04-12 MED ORDER — METHOCARBAMOL 500 MG PO TABS: 500.0000 mg | ORAL_TABLET | Freq: Three times a day (TID) | ORAL | 0 refills | Status: DC | PRN

## 2020-04-12 MED ORDER — ONDANSETRON HCL 4 MG/2ML IJ SOLN
INTRAMUSCULAR | Status: DC | PRN
  Administered 2020-04-12: 4 mg via INTRAVENOUS

## 2020-04-12 MED ORDER — ALBUTEROL SULFATE HFA 108 (90 BASE) MCG/ACT IN AERS
INHALATION_SPRAY | RESPIRATORY_TRACT | Status: DC | PRN
  Administered 2020-04-12: 2 via RESPIRATORY_TRACT
  Administered 2020-04-12: 1 via RESPIRATORY_TRACT

## 2020-04-12 MED ORDER — MIDAZOLAM HCL 2 MG/2ML IJ SOLN
INTRAMUSCULAR | Status: AC
  Filled 2020-04-12: qty 2

## 2020-04-12 MED ORDER — OXYCODONE-ACETAMINOPHEN 5-325 MG PO TABS
ORAL_TABLET | ORAL | Status: AC
  Filled 2020-04-12: qty 1

## 2020-04-12 MED ORDER — POVIDONE-IODINE 10 % EX SWAB: 2.0000 "application " | Freq: Once | CUTANEOUS | Status: DC

## 2020-04-12 MED ORDER — ROCURONIUM BROMIDE 10 MG/ML (PF) SYRINGE
PREFILLED_SYRINGE | INTRAVENOUS | Status: DC | PRN
  Administered 2020-04-12: 50 mg via INTRAVENOUS

## 2020-04-12 MED ORDER — BUPIVACAINE HCL (PF) 0.25 % IJ SOLN
INTRAMUSCULAR | Status: AC
  Filled 2020-04-12: qty 30

## 2020-04-12 MED ORDER — CHLORHEXIDINE GLUCONATE 0.12 % MT SOLN: 15.0000 mL | Freq: Once | OROMUCOSAL | Status: AC

## 2020-04-12 MED ORDER — FENTANYL CITRATE (PF) 250 MCG/5ML IJ SOLN
INTRAMUSCULAR | Status: AC
  Filled 2020-04-12: qty 5

## 2020-04-12 MED ORDER — FENTANYL CITRATE (PF) 100 MCG/2ML IJ SOLN
INTRAMUSCULAR | Status: DC | PRN
  Administered 2020-04-12: 25 ug via INTRAVENOUS
  Administered 2020-04-12: 75 ug via INTRAVENOUS

## 2020-04-12 MED ORDER — DEXAMETHASONE SODIUM PHOSPHATE 10 MG/ML IJ SOLN
INTRAMUSCULAR | Status: AC
  Filled 2020-04-12: qty 2

## 2020-04-12 MED ORDER — CLONIDINE HCL (ANALGESIA) 100 MCG/ML EP SOLN
EPIDURAL | Status: DC | PRN
  Administered 2020-04-12: 1 mL

## 2020-04-12 MED ORDER — MORPHINE SULFATE (PF) 4 MG/ML IV SOLN
INTRAVENOUS | Status: AC
  Filled 2020-04-12: qty 2

## 2020-04-12 MED ORDER — PHENYLEPHRINE HCL-NACL 10-0.9 MG/250ML-% IV SOLN
INTRAVENOUS | Status: DC | PRN
  Administered 2020-04-12: 15 ug/min via INTRAVENOUS

## 2020-04-12 MED ORDER — FENTANYL CITRATE (PF) 100 MCG/2ML IJ SOLN
INTRAMUSCULAR | Status: AC
  Filled 2020-04-12: qty 2

## 2020-04-12 MED ORDER — OXYCODONE-ACETAMINOPHEN 5-325 MG PO TABS
1.0000 | ORAL_TABLET | Freq: Once | ORAL | Status: AC
  Administered 2020-04-12: 1 via ORAL

## 2020-04-12 MED ORDER — BUPIVACAINE HCL (PF) 0.25 % IJ SOLN
INTRAMUSCULAR | Status: DC | PRN
  Administered 2020-04-12: 30 mL

## 2020-04-12 MED ORDER — PHENYLEPHRINE 40 MCG/ML (10ML) SYRINGE FOR IV PUSH (FOR BLOOD PRESSURE SUPPORT)
PREFILLED_SYRINGE | INTRAVENOUS | Status: AC
  Filled 2020-04-12: qty 10

## 2020-04-12 MED ORDER — MORPHINE SULFATE (PF) 4 MG/ML IV SOLN
INTRAVENOUS | Status: DC | PRN
  Administered 2020-04-12: 8 mg via INTRAMUSCULAR

## 2020-04-12 MED ORDER — FENTANYL CITRATE (PF) 100 MCG/2ML IJ SOLN
25.0000 ug | INTRAMUSCULAR | Status: DC | PRN
  Administered 2020-04-12 (×2): 50 ug via INTRAVENOUS

## 2020-04-12 MED ORDER — SUCCINYLCHOLINE CHLORIDE 200 MG/10ML IV SOSY
PREFILLED_SYRINGE | INTRAVENOUS | Status: AC
  Filled 2020-04-12: qty 10

## 2020-04-12 MED ORDER — LACTATED RINGERS IV SOLN: INTRAVENOUS | Status: DC

## 2020-04-12 MED ORDER — LIDOCAINE 2% (20 MG/ML) 5 ML SYRINGE
INTRAMUSCULAR | Status: AC
  Filled 2020-04-12: qty 10

## 2020-04-12 MED ORDER — PROPOFOL 10 MG/ML IV BOLUS
INTRAVENOUS | Status: DC | PRN
  Administered 2020-04-12: 120 mg via INTRAVENOUS

## 2020-04-12 MED ORDER — SUGAMMADEX SODIUM 200 MG/2ML IV SOLN
INTRAVENOUS | Status: DC | PRN
  Administered 2020-04-12: 180 mg via INTRAVENOUS

## 2020-04-12 MED ORDER — GLYCOPYRROLATE PF 0.2 MG/ML IJ SOSY
PREFILLED_SYRINGE | INTRAMUSCULAR | Status: AC
  Filled 2020-04-12: qty 2

## 2020-04-12 MED ORDER — PROPOFOL 10 MG/ML IV BOLUS
INTRAVENOUS | Status: AC
  Filled 2020-04-12: qty 40

## 2020-04-12 MED ORDER — CHLORHEXIDINE GLUCONATE 0.12 % MT SOLN
OROMUCOSAL | Status: AC
  Administered 2020-04-12: 15 mL
  Filled 2020-04-12: qty 15

## 2020-04-12 MED ORDER — SODIUM CHLORIDE 0.9 % IR SOLN
Status: DC | PRN
  Administered 2020-04-12: 1000 mL

## 2020-04-12 MED ORDER — DEXAMETHASONE SODIUM PHOSPHATE 10 MG/ML IJ SOLN
INTRAMUSCULAR | Status: DC | PRN
  Administered 2020-04-12: 10 mg via INTRAVENOUS

## 2020-04-12 MED ORDER — LIDOCAINE 2% (20 MG/ML) 5 ML SYRINGE
INTRAMUSCULAR | Status: DC | PRN
  Administered 2020-04-12: 70 mg via INTRAVENOUS

## 2020-04-12 MED ORDER — ALBUTEROL SULFATE HFA 108 (90 BASE) MCG/ACT IN AERS
INHALATION_SPRAY | RESPIRATORY_TRACT | Status: AC
  Filled 2020-04-12: qty 6.7

## 2020-04-12 MED ORDER — CLONIDINE HCL (ANALGESIA) 100 MCG/ML EP SOLN
EPIDURAL | Status: AC
  Filled 2020-04-12: qty 10

## 2020-04-12 MED ORDER — CEFAZOLIN SODIUM-DEXTROSE 2-4 GM/100ML-% IV SOLN
2.0000 g | INTRAVENOUS | Status: AC
  Administered 2020-04-12: 2 g via INTRAVENOUS
  Filled 2020-04-12: qty 100

## 2020-04-12 MED ORDER — SODIUM CHLORIDE 0.9 % IV SOLN: INTRAVENOUS | Status: DC

## 2020-04-12 MED ORDER — POVIDONE-IODINE 7.5 % EX SOLN
Freq: Once | CUTANEOUS | Status: DC
  Filled 2020-04-12: qty 118

## 2020-04-12 MED ORDER — OXYCODONE-ACETAMINOPHEN 5-325 MG PO TABS: 1.0000 | ORAL_TABLET | ORAL | 0 refills | Status: DC | PRN

## 2020-04-12 MED ORDER — LACTATED RINGERS IV SOLN: INTRAVENOUS | Status: DC | PRN

## 2020-04-12 MED ORDER — ONDANSETRON HCL 4 MG/2ML IJ SOLN
INTRAMUSCULAR | Status: AC
  Filled 2020-04-12: qty 4

## 2020-04-12 MED ORDER — SODIUM CHLORIDE 0.9 % IR SOLN
Status: DC | PRN
  Administered 2020-04-12: 3000 mL

## 2020-04-12 MED ORDER — ACETAMINOPHEN 500 MG PO TABS
1000.0000 mg | ORAL_TABLET | Freq: Once | ORAL | Status: AC
  Administered 2020-04-12: 1000 mg via ORAL
  Filled 2020-04-12: qty 2

## 2020-04-12 MED ORDER — ORAL CARE MOUTH RINSE: 15.0000 mL | Freq: Once | OROMUCOSAL | Status: AC

## 2020-04-12 SURGICAL SUPPLY — 60 items
AID PSTN UNV HD RSTRNT DISP (MISCELLANEOUS) ×1
ANCH SUT 2 SWLK 19.1 CLS EYLT (Anchor) ×2 IMPLANT
ANCH SUT FBRTK 1.3 2 TPE (Anchor) ×2 IMPLANT
ANCHOR FBRTK 2.6 SUTURETAP 1.3 (Anchor) ×6 IMPLANT
ANCHOR SUT 1.8 FBRTK KNTLS 2SU (Anchor) ×6 IMPLANT
ANCHOR SWIVELOCK BIO 4.75X19.1 (Anchor) ×6 IMPLANT
BLADE EXCALIBUR 4.0MM X 13CM (MISCELLANEOUS) ×1
BLADE EXCALIBUR 4.0X13 (MISCELLANEOUS) ×2 IMPLANT
CLOSURE WOUND 1/2 X4 (GAUZE/BANDAGES/DRESSINGS) ×1
COVER SURGICAL LIGHT HANDLE (MISCELLANEOUS) ×3 IMPLANT
DRAPE INCISE IOBAN 66X45 STRL (DRAPES) ×6 IMPLANT
DRAPE STERI 35X30 U-POUCH (DRAPES) ×3 IMPLANT
DRAPE U-SHAPE 47X51 STRL (DRAPES) ×6 IMPLANT
DRSG TEGADERM 4X10 (GAUZE/BANDAGES/DRESSINGS) ×12 IMPLANT
DRSG TEGADERM 4X4.75 (GAUZE/BANDAGES/DRESSINGS) ×9 IMPLANT
DRSG XEROFORM 1X8 (GAUZE/BANDAGES/DRESSINGS) ×3 IMPLANT
DURAPREP 26ML APPLICATOR (WOUND CARE) ×3 IMPLANT
DW OUTFLOW CASSETTE/TUBE SET (MISCELLANEOUS) ×3 IMPLANT
ELECT REM PT RETURN 9FT ADLT (ELECTROSURGICAL) ×3
ELECTRODE REM PT RTRN 9FT ADLT (ELECTROSURGICAL) ×1 IMPLANT
GAUZE SPONGE 4X4 12PLY STRL (GAUZE/BANDAGES/DRESSINGS) ×3 IMPLANT
GAUZE SPONGE 4X4 12PLY STRL LF (GAUZE/BANDAGES/DRESSINGS) ×3 IMPLANT
GLOVE BIOGEL PI IND STRL 7.0 (GLOVE) ×1 IMPLANT
GLOVE BIOGEL PI IND STRL 8 (GLOVE) ×1 IMPLANT
GLOVE BIOGEL PI INDICATOR 7.0 (GLOVE) ×2
GLOVE BIOGEL PI INDICATOR 8 (GLOVE) ×2
GLOVE ECLIPSE 7.0 STRL STRAW (GLOVE) ×3 IMPLANT
GLOVE ECLIPSE 8.0 STRL XLNG CF (GLOVE) ×3 IMPLANT
GOWN STRL REUS W/ TWL LRG LVL3 (GOWN DISPOSABLE) ×3 IMPLANT
GOWN STRL REUS W/TWL LRG LVL3 (GOWN DISPOSABLE) ×9
KIT BASIN OR (CUSTOM PROCEDURE TRAY) ×3 IMPLANT
KIT STR SPEAR 1.8 FBRTK DISP (KITS) ×3 IMPLANT
KIT TURNOVER KIT B (KITS) ×3 IMPLANT
MANIFOLD NEPTUNE II (INSTRUMENTS) ×3 IMPLANT
NDL SUT 6 .5 CRC .975X.05 MAYO (NEEDLE) ×1 IMPLANT
NEEDLE MAYO TAPER (NEEDLE) ×3
NEEDLE SCORPION MULTI FIRE (NEEDLE) ×3 IMPLANT
NEEDLE SPNL 18GX3.5 QUINCKE PK (NEEDLE) ×3 IMPLANT
NS IRRIG 1000ML POUR BTL (IV SOLUTION) ×3 IMPLANT
PACK SHOULDER (CUSTOM PROCEDURE TRAY) ×3 IMPLANT
PAD ARMBOARD 7.5X6 YLW CONV (MISCELLANEOUS) ×6 IMPLANT
PROBE APOLLO 90XL (SURGICAL WAND) ×3 IMPLANT
RESTRAINT HEAD UNIVERSAL NS (MISCELLANEOUS) ×3 IMPLANT
SLING ARM IMMOBILIZER LRG (SOFTGOODS) ×3 IMPLANT
SPONGE LAP 4X18 RFD (DISPOSABLE) ×6 IMPLANT
STRIP CLOSURE SKIN 1/2X4 (GAUZE/BANDAGES/DRESSINGS) ×2 IMPLANT
SUCTION FRAZIER HANDLE 10FR (MISCELLANEOUS) ×2
SUCTION TUBE FRAZIER 10FR DISP (MISCELLANEOUS) ×1 IMPLANT
SUT ETHILON 3 0 PS 1 (SUTURE) ×3 IMPLANT
SUT MNCRL AB 3-0 PS2 18 (SUTURE) ×3 IMPLANT
SUT VIC AB 0 CT1 27 (SUTURE) ×3
SUT VIC AB 0 CT1 27XBRD ANBCTR (SUTURE) ×1 IMPLANT
SUT VIC AB 2-0 CT1 27 (SUTURE) ×3
SUT VIC AB 2-0 CT1 TAPERPNT 27 (SUTURE) ×1 IMPLANT
SUT VICRYL 0 UR6 27IN ABS (SUTURE) ×9 IMPLANT
SUT VICRYL 1 TIES 12X18 (SUTURE) ×3 IMPLANT
TOWEL GREEN STERILE (TOWEL DISPOSABLE) ×3 IMPLANT
TOWEL GREEN STERILE FF (TOWEL DISPOSABLE) ×3 IMPLANT
TUBING ARTHROSCOPY IRRIG 16FT (MISCELLANEOUS) ×3 IMPLANT
WATER STERILE IRR 1000ML POUR (IV SOLUTION) ×3 IMPLANT

## 2020-04-12 NOTE — Anesthesia Procedure Notes (Signed)
Procedure Name: Intubation Date/Time: 04/12/2020 3:42 PM Performed by: Cleda Daub, CRNA Pre-anesthesia Checklist: Patient identified, Emergency Drugs available, Suction available and Patient being monitored Patient Re-evaluated:Patient Re-evaluated prior to induction Oxygen Delivery Method: Circle system utilized Preoxygenation: Pre-oxygenation with 100% oxygen Induction Type: IV induction Ventilation: Mask ventilation without difficulty Laryngoscope Size: Mac and 4 Grade View: Grade I Tube type: Oral Tube size: 7.0 mm Number of attempts: 1 Airway Equipment and Method: Stylet and Oral airway Placement Confirmation: ETT inserted through vocal cords under direct vision,  positive ETCO2 and breath sounds checked- equal and bilateral Secured at: 23 cm Tube secured with: Tape Dental Injury: Teeth and Oropharynx as per pre-operative assessment

## 2020-04-12 NOTE — H&P (Signed)
Gregory Christensen is an 69 y.o. male.   Chief Complaint: Left shoulder pain HPI: Gregory Christensen is a 69 year old patient with left shoulder pain.  Has had a long history of left shoulder pain refractory to nonoperative management.  Describes weakness and pain in the left shoulder.  MRI scan shows moderate AC joint arthritis without edema in the distal clavicle anterior labral tear as well as near full-thickness bursal sided supraspinatus tear.  Presents now for operative management after explanation of risks and benefits.  The shoulder pain does interfere with his activities of daily living and sleep and function.  He does have a history of DVT but stopped taking Eliquis 3 days ago.  We plan to resume Eliquis tomorrow.  Past Medical History:  Diagnosis Date   Acute deep vein thrombosis (DVT) of left lower extremity (HCC)    Acute hypoxemic respiratory failure (HCC)    Anemia    Iron deficiency   Anxiety    Aortic atherosclerosis (HCC)    Arthritis    Benign prostatic hyperplasia 08/28/2018   UNSPECIFIED WHETHER LOWER URINARY TRACT SYMPTOMS PRESENT   CAD (coronary artery disease)    Cancer (HCC)    Chronic back pain    Chronic hip pain    Constipation    due to Iron M-W-F   COPD exacerbation ([place]) 10/18/2017   Dyspnea    Emphysema lung (HCC)    Essential hypertension    GERD (gastroesophageal reflux disease)    Gout    Gross hematuria 09/08/2018   Head injury, acute, with loss of consciousness ([place])    unsure how long he was unresponsive   History of blood transfusion 05/2019   History of kidney stones    History of tobacco abuse    Hypoxia    Insomnia    Oxygen dependent    2l- 24/7   Pneumonia    in 1980's   Pre-diabetes    Pulmonary embolism ([place]) 05/31/2018   Renal mass    CONCERNING FOR RENAL CELL CARCINOMA DR. Lovena Neighbours   Respiratory distress    Respiratory failure, acute-on-chronic ([place]) 08/03/2018   Seasonal allergies    Sleep apnea     Past  Surgical History:  Procedure Laterality Date   COLONOSCOPY WITH PROPOFOL N/A 09/27/2019   Procedure: COLONOSCOPY WITH PROPOFOL;  Surgeon: Irene Shipper, MD;  Location: [facility];  Service: Endoscopy;  Laterality: N/A;   CYSTOSCOPY/URETEROSCOPY/HOLMIUM LASER/STENT PLACEMENT Left 10/31/2018   Procedure: CYSTOSCOPY/URETEROSCOPY;  Surgeon: Ceasar Mons, MD;  Location: [facility];  Service: Urology;  Laterality: Left;   ESOPHAGOGASTRODUODENOSCOPY (EGD) WITH PROPOFOL N/A 09/27/2019   Procedure: ESOPHAGOGASTRODUODENOSCOPY (EGD) WITH PROPOFOL;  Surgeon: Irene Shipper, MD;  Location: [facility];  Service: Endoscopy;  Laterality: N/A;   HERNIA REPAIR Left 1977   inguinal   HYDROCELE EXCISION Left 05/13/2019   Procedure: HYDROCELECTOMY ADULT;  Surgeon: Ceasar Mons, MD;  Location: [facility];  Service: Urology;  Laterality: Left;   [place]   EXTENSIVE   POLYPECTOMY  09/27/2019   Procedure: POLYPECTOMY;  Surgeon: Irene Shipper, MD;  Location: [facility];  Service: Endoscopy;;   ROBOT ASSITED LAPAROSCOPIC NEPHROURETERECTOMY Left 10/31/2018   Procedure: XI ROBOT ASSITED LAPAROSCOPIC NEPHROURETERECTOMY;  Surgeon: Ceasar Mons, MD;  Location: [facility];  Service: Urology;  Laterality: Left;  ONLY NEEDS 240 MIN FOR ALL PROCEDURES    Family History  Problem Relation Age of Onset   Diabetes Mother    Hypertension Sister    Hypertension Brother  Social History:  reports that he has been smoking cigarettes. He has a 28.50 pack-year smoking history. He has never used smokeless tobacco. He reports previous alcohol use of about 10.0 standard drinks of alcohol per week. He reports previous drug use. Drug: Flunitrazepam.  Allergies: No Known Allergies  Medications Prior to Admission  Medication Sig Dispense Refill   acetaminophen (TYLENOL) 500 MG tablet Take 1,000 mg by mouth every 6 (six) hours as needed for moderate pain or headache.      albuterol  (PROVENTIL HFA;VENTOLIN HFA) 108 (90 Base) MCG/ACT inhaler Inhale 2 puffs into the lungs every 2 (two) hours as needed for wheezing or shortness of breath. 1 Inhaler 2   albuterol (PROVENTIL) (2.5 MG/3ML) 0.083% nebulizer solution Take 3 mLs (2.5 mg total) by nebulization every 6 (six) hours as needed for wheezing or shortness of breath. 75 mL 12   amLODipine (NORVASC) 2.5 MG tablet Take 2.5 mg by mouth daily.     apixaban (ELIQUIS) 5 MG TABS tablet Take 1 tablet (5 mg total) by mouth 2 (two) times daily. 60 tablet 1   budesonide-formoterol (SYMBICORT) 160-4.5 MCG/ACT inhaler Inhale 1 puff into the lungs 2 (two) times daily. (Patient taking differently: Inhale 1 puff into the lungs 2 (two) times daily as needed (shortness of breath). ) 1 Inhaler 12   docusate sodium (COLACE) 100 MG capsule Take 100 mg by mouth daily as needed for mild constipation.     ferrous sulfate 325 (65 FE) MG tablet Take 325 mg by mouth daily with breakfast.     fluticasone (FLONASE) 50 MCG/ACT nasal spray Place 1 spray into both nostrils 2 (two) times daily. (Patient taking differently: Place 1 spray into both nostrils daily as needed for allergies. ) 16 g 2   gabapentin (NEURONTIN) 400 MG capsule Take 400 mg by mouth 2 (two) times daily.     hydroxypropyl methylcellulose / hypromellose (ISOPTO TEARS / GONIOVISC) 2.5 % ophthalmic solution Place 1 drop into both eyes 3 (three) times daily as needed for dry eyes.     lidocaine (LIDODERM) 5 % Place 1 patch onto the skin daily. Remove & Discard patch within 12 hours or as directed by MD     loratadine (CLARITIN) 10 MG tablet Take 10 mg by mouth daily as needed for allergies.      pantoprazole (PROTONIX) 40 MG tablet Take 40 mg by mouth daily.     tamsulosin (FLOMAX) 0.4 MG CAPS capsule Take 0.4 mg by mouth at bedtime.      Tiotropium Bromide Monohydrate (SPIRIVA RESPIMAT) 2.5 MCG/ACT AERS Inhale 2 puffs into the lungs daily.     traMADol (ULTRAM) 50 MG tablet Take 2  tablets (100 mg total) by mouth every 6 (six) hours as needed for moderate pain. (Patient taking differently: Take 100 mg by mouth every 6 (six) hours. ) 40 tablet 0   diclofenac Sodium (VOLTAREN) 1 % GEL Apply 2 g topically 2 (two) times daily as needed (Pain).     montelukast (SINGULAIR) 10 MG tablet Take 10 mg by mouth at bedtime.      Results for orders placed or performed during the hospital encounter of 04/12/20 (from the past 48 hour(s))  Glucose, capillary     Status: Abnormal   Collection Time: 04/12/20  1:02 PM  Result Value Ref Range   Glucose-Capillary 132 (H) 70 - 99 mg/dL    Comment: Glucose reference range applies only to samples taken after fasting for at least 8 hours.  No results found.  Review of Systems  Musculoskeletal: Positive for arthralgias.  All other systems reviewed and are negative.   Blood pressure (!) 148/79, pulse 79, temperature 98 F (36.7 C), temperature source Oral, resp. rate 20, height 6\' 1"  (1.854 m), weight 73.7 kg, SpO2 99 %. Physical Exam Vitals reviewed.  HENT:     Head: Normocephalic.     Nose: Nose normal.     Mouth/Throat:     Mouth: Mucous membranes are moist.  Eyes:     Pupils: Pupils are equal, round, and reactive to light.  Cardiovascular:     Rate and Rhythm: Normal rate.     Pulses: Normal pulses.  Pulmonary:     Effort: Pulmonary effort is normal.  Abdominal:     General: Abdomen is flat.  Skin:    General: Skin is warm.     Capillary Refill: Capillary refill takes less than 2 seconds.  Neurological:     General: No focal deficit present.     Mental Status: He is alert.  Psychiatric:        Mood and Affect: Mood normal.   Examination of the left shoulder demonstrates no AC joint tenderness.  Rotator cuff strength is pretty reasonable to subscap infraspinatus testing.  A little bit weaker to supraspinatus testing.  Mild coarseness with passive range of motion.  Deltoid is functional.  O'Brien's testing positive.   Impingement signs positive on the left-hand side.  Motor sensory function left hand is intact  Assessment/Plan Impression is left shoulder pain long duration with near full-thickness tear of the supraspinatus based on MRI scanning.  Also has anterior labral tear.  Plan is arthroscopy with rotator cuff evaluation biceps tendon release biceps tenodesis and rotator cuff repair.  Risk and benefits are discussed with the patient including not limited to infection nerve vessel damage incomplete healing potential need for further surgery as well as incomplete pain relief and shoulder stiffness.  Patient understands the risk and benefits and wishes to proceed.  All questions answered.  [name] Malta, MD 04/12/2020, 3:21 PM

## 2020-04-12 NOTE — Transfer of Care (Signed)
Immediate Anesthesia Transfer of Care Note  Patient: [name]  Procedure(s) Performed: LEFT SHOULDER ARTHROSCOPY, DEBRIDEMENT, BICEPS TENODESIS, MINI OPEN ROTATOR CUFF TEAR REPAIR (Left )  Patient Location: PACU  Anesthesia Type:General  Level of Consciousness: awake, alert , oriented and patient cooperative  Airway & Oxygen Therapy: Patient Spontanous Breathing and Patient connected to face mask oxygen  Post-op Assessment: Report given to RN and Post -op Vital signs reviewed and stable  Post vital signs: Reviewed and stable  Last Vitals:  Vitals Value Taken Time  BP 112/68 04/12/20 1831  Temp 36.5 C 04/12/20 1830  Pulse 67 04/12/20 1832  Resp 22 04/12/20 1832  SpO2 100 % 04/12/20 1832  Vitals shown include unvalidated device data.  Last Pain:  Vitals:   04/12/20 1310  TempSrc:   PainSc: 6       Patients Stated Pain Goal: 2 (57/49/35 5217)  Complications: No complications documented.

## 2020-04-12 NOTE — Anesthesia Postprocedure Evaluation (Signed)
Anesthesia Post Note  Patient: Gregory Christensen  Procedure(s) Performed: LEFT SHOULDER ARTHROSCOPY, DEBRIDEMENT, BICEPS TENODESIS, MINI OPEN ROTATOR CUFF TEAR REPAIR (Left )     Patient location during evaluation: PACU Anesthesia Type: General Level of consciousness: patient cooperative and awake Pain management: pain level controlled Vital Signs Assessment: post-procedure vital signs reviewed and stable Respiratory status: spontaneous breathing, nonlabored ventilation, respiratory function stable and patient connected to nasal cannula oxygen Cardiovascular status: blood pressure returned to baseline and stable Postop Assessment: no apparent nausea or vomiting Anesthetic complications: no   No complications documented.  Last Vitals:  Vitals:   04/12/20 1900 04/12/20 1915  BP: 129/80 (!) 151/97  Pulse: 69 66  Resp: 19 11  Temp:    SpO2: 100% 100%    Last Pain:  Vitals:   04/12/20 1900  TempSrc:   PainSc: Asleep                 Margart Zemanek

## 2020-04-12 NOTE — Brief Op Note (Signed)
   04/12/2020  6:07 PM  PATIENT:  Gregory Christensen  69 y.o. male  PRE-OPERATIVE DIAGNOSIS:  left shoulder rotator cuff tear, biceps tendonitis  POST-OPERATIVE DIAGNOSIS:  left shoulder rotator cuff tear, biceps tendonitis  PROCEDURE:  Procedure(s): LEFT SHOULDER ARTHROSCOPY, DEBRIDEMENT, BICEPS TENODESIS, MINI OPEN ROTATOR CUFF TEAR REPAIR  SURGEON:  Surgeon(s): Marlou Sa, Tonna Corner, MD  ASSISTANT: Magnant pa  ANESTHESIA:   general  EBL: 25 ml    Total I/O In: 100 [IV Piggyback:100] Out: -   BLOOD ADMINISTERED: none  DRAINS: none   LOCAL MEDICATIONS USED:  none  SPECIMEN:  No Specimen  COUNTS:  YES  TOURNIQUET:  * No tourniquets in log *  DICTATION: .Other Dictation: Dictation Number [id number]  PLAN OF CARE: Discharge to home after PACU  PATIENT DISPOSITION:  PACU - hemodynamically stable

## 2020-04-13 ENCOUNTER — Emergency Department (HOSPITAL_COMMUNITY)
Admission: EM | Admit: 2020-04-13 | Discharge: 2020-04-13 | Disposition: A | Discharge status: Home / Self Care | Payer: No Typology Code available for payment source | Attending: Emergency Medicine | Admitting: Emergency Medicine

## 2020-04-13 ENCOUNTER — Encounter (HOSPITAL_COMMUNITY): Payer: Self-pay

## 2020-04-13 DIAGNOSIS — Z79899 Other long term (current) drug therapy: Secondary | ICD-10-CM | POA: Diagnosis not present

## 2020-04-13 DIAGNOSIS — R350 Frequency of micturition: Secondary | ICD-10-CM | POA: Insufficient documentation

## 2020-04-13 DIAGNOSIS — I2510 Atherosclerotic heart disease of native coronary artery without angina pectoris: Secondary | ICD-10-CM | POA: Insufficient documentation

## 2020-04-13 DIAGNOSIS — Z7901 Long term (current) use of anticoagulants: Secondary | ICD-10-CM | POA: Diagnosis not present

## 2020-04-13 DIAGNOSIS — F17210 Nicotine dependence, cigarettes, uncomplicated: Secondary | ICD-10-CM | POA: Insufficient documentation

## 2020-04-13 DIAGNOSIS — Z85038 Personal history of other malignant neoplasm of large intestine: Secondary | ICD-10-CM | POA: Insufficient documentation

## 2020-04-13 DIAGNOSIS — Z7951 Long term (current) use of inhaled steroids: Secondary | ICD-10-CM | POA: Insufficient documentation

## 2020-04-13 DIAGNOSIS — I10 Essential (primary) hypertension: Secondary | ICD-10-CM | POA: Diagnosis not present

## 2020-04-13 DIAGNOSIS — N390 Urinary tract infection, site not specified: Secondary | ICD-10-CM | POA: Diagnosis not present

## 2020-04-13 DIAGNOSIS — J449 Chronic obstructive pulmonary disease, unspecified: Secondary | ICD-10-CM | POA: Insufficient documentation

## 2020-04-13 DIAGNOSIS — Z8616 Personal history of COVID-19: Secondary | ICD-10-CM | POA: Diagnosis not present

## 2020-04-13 LAB — URINALYSIS, ROUTINE W REFLEX MICROSCOPIC
Bacteria, UA: NONE SEEN
Bilirubin Urine: NEGATIVE
Glucose, UA: NEGATIVE mg/dL
Ketones, ur: NEGATIVE mg/dL
Nitrite: NEGATIVE
Protein, ur: NEGATIVE mg/dL
Specific Gravity, Urine: 1.004 — ABNORMAL LOW (ref 1.005–1.030)
pH: 6 (ref 5.0–8.0)

## 2020-04-13 LAB — BASIC METABOLIC PANEL
Anion gap: 9 (ref 5–15)
BUN: 17 mg/dL (ref 8–23)
CO2: 19 mmol/L — ABNORMAL LOW (ref 22–32)
Calcium: 8.8 mg/dL — ABNORMAL LOW (ref 8.9–10.3)
Chloride: 105 mmol/L (ref 98–111)
Creatinine, Ser: 1.78 mg/dL — ABNORMAL HIGH (ref 0.61–1.24)
GFR, Estimated: 38 mL/min — ABNORMAL LOW (ref >60)
Glucose, Bld: 131 mg/dL — ABNORMAL HIGH (ref 70–99)
Potassium: 5 mmol/L (ref 3.5–5.1)
Sodium: 133 mmol/L — ABNORMAL LOW (ref 135–145)

## 2020-04-13 MED ORDER — PHENAZOPYRIDINE HCL 200 MG PO TABS: 200.0000 mg | ORAL_TABLET | Freq: Three times a day (TID) | ORAL | 0 refills | Status: DC | PRN

## 2020-04-13 MED ORDER — OXYCODONE-ACETAMINOPHEN 5-325 MG PO TABS
1.0000 | ORAL_TABLET | Freq: Once | ORAL | Status: AC
  Administered 2020-04-13: 1 via ORAL
  Filled 2020-04-13: qty 1

## 2020-04-13 MED ORDER — CEPHALEXIN 500 MG PO CAPS: 500.0000 mg | ORAL_CAPSULE | Freq: Two times a day (BID) | ORAL | 0 refills | Status: DC

## 2020-04-13 MED ORDER — BELLADONNA ALKALOIDS-OPIUM 16.2-30 MG RE SUPP
1.0000 | Freq: Once | RECTAL | Status: AC
  Administered 2020-04-13: 1 via RECTAL
  Filled 2020-04-13: qty 1

## 2020-04-13 NOTE — ED Triage Notes (Signed)
Pt had rotator cuff surgery yesterday and today he has very frequent urination, he states that he has to urinate every 5 minutes and fills a urinal everytime

## 2020-04-13 NOTE — ED Notes (Signed)
Pt is from home and is on 2 liters of O2 all the time for COPD

## 2020-04-13 NOTE — ED Provider Notes (Signed)
[facility] DEPT Provider Note   CSN: [id number] Arrival date & time: 04/13/20  0335     History No chief complaint on file.   Gregory Christensen is a 69 y.o. male.  Patient presents to the emergency department for evaluation of urinary frequency.  Patient reports that he had rotator cuff surgery yesterday.  He got home from the hospital around 8 PM.  Since he got home he has noticed urinary urgency and frequency.  He initially had some mild dysuria but that has resolved.  No back pain, no fever, nausea or vomiting.  Patient has not noticed any hematuria.        Past Medical History:  Diagnosis Date  . Acute deep vein thrombosis (DVT) of left lower extremity ([place])   . Acute hypoxemic respiratory failure ([place])   . Anemia    Iron deficiency  . Anxiety   . Aortic atherosclerosis ([place])   . Arthritis   . Benign prostatic hyperplasia 08/28/2018   UNSPECIFIED WHETHER LOWER URINARY TRACT SYMPTOMS PRESENT  . CAD (coronary artery disease)   . Cancer ([place])   . Chronic back pain   . Chronic hip pain   . Constipation    due to Iron M-W-F  . COPD exacerbation ([place]) 10/18/2017  . Dyspnea   . Emphysema lung ([place])   . Essential hypertension   . GERD (gastroesophageal reflux disease)   . Gout   . Gross hematuria 09/08/2018  . Head injury, acute, with loss of consciousness ([place])    unsure how long he was unresponsive  . History of blood transfusion 05/2019  . History of kidney stones   . History of tobacco abuse   . Hypoxia   . Insomnia   . 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 Pneumonia    in 1980's  . Pre-diabetes   . Pulmonary embolism ([place]) 05/31/2018  . Renal mass    CONCERNING FOR RENAL CELL CARCINOMA DR. Lovena Neighbours  . Respiratory distress   . Respiratory failure, acute-on-chronic ([place]) 08/03/2018  . Seasonal allergies   . Sleep apnea     Patient Active Problem List   Diagnosis Date Noted  . Benign neoplasm of transverse colon   . Benign neoplasm  of descending colon   . Benign neoplasm of rectum   . Heme positive stool   . Iron deficiency anemia due to chronic blood loss   . Melena   . Abdominal pain, chronic, right lower quadrant   . Chronic anticoagulation   . Symptomatic anemia 09/24/2019  . Pneumonia due to severe acute respiratory syndrome coronavirus 2 (SARS-CoV-2) 02/28/2019  . Renal mass 10/31/2018  . Chest pressure 10/15/2018  . Coronary artery calcification 10/15/2018  . Respiratory failure, acute-on-chronic ([place]) 08/03/2018  . Acute deep vein thrombosis (DVT) of left lower extremity ([place])   . Acute hypoxemic respiratory failure ([place])   . Pulmonary embolism ([place]) 05/31/2018  . COPD exacerbation ([place]) 10/18/2017  . Hypoxia   . Respiratory distress   . Essential hypertension     Past Surgical History:  Procedure Laterality Date  . COLONOSCOPY WITH PROPOFOL N/A 09/27/2019   Procedure: COLONOSCOPY WITH PROPOFOL;  Surgeon: Irene Shipper, MD;  Location: [facility];  Service: Endoscopy;  Laterality: N/A;  . CYSTOSCOPY/URETEROSCOPY/HOLMIUM LASER/STENT PLACEMENT Left 10/31/2018   Procedure: CYSTOSCOPY/URETEROSCOPY;  Surgeon: Ceasar Mons, MD;  Location: [facility];  Service: Urology;  Laterality: Left;  . ESOPHAGOGASTRODUODENOSCOPY (EGD) WITH PROPOFOL N/A 09/27/2019   Procedure: ESOPHAGOGASTRODUODENOSCOPY (EGD) WITH  PROPOFOL;  Surgeon: Irene Shipper, MD;  Location: [facility];  Service: Endoscopy;  Laterality: N/A;  . HERNIA REPAIR Left 1977   inguinal  . HYDROCELE EXCISION Left 05/13/2019   Procedure: HYDROCELECTOMY ADULT;  Surgeon: Ceasar Mons, MD;  Location: [facility];  Service: Urology;  Laterality: Left;  Marland Kitchen MANDIBLE SURGERY  1976   EXTENSIVE  . POLYPECTOMY  09/27/2019   Procedure: POLYPECTOMY;  Surgeon: Irene Shipper, MD;  Location: [facility];  Service: Endoscopy;;  . ROBOT ASSITED LAPAROSCOPIC NEPHROURETERECTOMY Left 10/31/2018   Procedure: XI ROBOT ASSITED LAPAROSCOPIC NEPHROURETERECTOMY;   Surgeon: Ceasar Mons, MD;  Location: [facility];  Service: Urology;  Laterality: Left;  ONLY NEEDS 240 MIN FOR ALL PROCEDURES       Family History  Problem Relation Age of Onset  . Diabetes Mother   . Hypertension Sister   . Hypertension Brother     Social History   Tobacco Use  . Smoking status: Current Every Day Smoker    Packs/day: 0.50    Years: 57.00    Pack years: 28.50    Types: Cigarettes  . Smokeless tobacco: Never Used  Vaping Use  . Vaping Use: Never used  Substance Use Topics  . Alcohol use: Not Currently    Alcohol/week: 10.0 standard drinks    Types: 10 Glasses of wine per week    Comment: 0-2 beers a day   . Drug use: Not Currently    Types: Flunitrazepam    Home Medications Prior to Admission medications   Medication Sig Start Date End Date Taking? Authorizing Provider  albuterol (PROVENTIL HFA;VENTOLIN HFA) 108 (90 Base) MCG/ACT inhaler Inhale 2 puffs into the lungs every 2 (two) hours as needed for wheezing or shortness of breath. 08/04/18  Yes Reyne Dumas, MD  albuterol (PROVENTIL) (2.5 MG/3ML) 0.083% nebulizer solution Take 3 mLs (2.5 mg total) by nebulization every 6 (six) hours as needed for wheezing or shortness of breath. 08/04/18  Yes Reyne Dumas, MD  amLODipine (NORVASC) 2.5 MG tablet Take 2.5 mg by mouth daily.   Yes [provider]  apixaban (ELIQUIS) 5 MG TABS tablet Take 1 tablet (5 mg total) by mouth 2 (two) times daily. 06/10/18  Yes Regalado, Belkys A, MD  budesonide-formoterol (SYMBICORT) 160-4.5 MCG/ACT inhaler Inhale 1 puff into the lungs 2 (two) times daily. Patient taking differently: Inhale 1 puff into the lungs 2 (two) times daily as needed (shortness of breath).  08/04/18  Yes Reyne Dumas, MD  diclofenac Sodium (VOLTAREN) 1 % GEL Apply 2 g topically 2 (two) times daily as needed (Pain).   Yes [provider]  docusate sodium (COLACE) 100 MG capsule Take 100 mg by mouth daily as needed for mild constipation.    Yes [provider]  ferrous sulfate 325 (65 FE) MG tablet Take 325 mg by mouth daily with breakfast.   Yes [provider]  fluticasone (FLONASE) 50 MCG/ACT nasal spray Place 1 spray into both nostrils 2 (two) times daily. Patient taking differently: Place 1 spray into both nostrils daily as needed for allergies.  08/04/18  Yes Reyne Dumas, MD  gabapentin (NEURONTIN) 400 MG capsule Take 400 mg by mouth 2 (two) times daily.   Yes [provider]  hydroxypropyl methylcellulose / hypromellose (ISOPTO TEARS / GONIOVISC) 2.5 % ophthalmic solution Place 1 drop into both eyes 3 (three) times daily as needed for dry eyes.   Yes [provider]  lidocaine (LIDODERM) 5 % Place 1 patch onto  the skin daily. Remove & Discard patch within 12 hours or as directed by MD   Yes [provider]  loratadine (CLARITIN) 10 MG tablet Take 10 mg by mouth daily as needed for allergies.    Yes [provider]  methocarbamol (ROBAXIN) 500 MG tablet Take 1 tablet (500 mg total) by mouth every 8 (eight) hours as needed. 04/12/20  Yes Magnant, Gerrianne Scale, PA-C  oxyCODONE-acetaminophen (PERCOCET) 5-325 MG tablet Take 1 tablet by mouth every 4 (four) hours as needed for severe pain. 04/12/20 04/12/21 Yes Magnant, Charles L, PA-C  pantoprazole (PROTONIX) 40 MG tablet Take 40 mg by mouth daily.   Yes [provider]  tamsulosin (FLOMAX) 0.4 MG CAPS capsule Take 0.4 mg by mouth at bedtime.    Yes [provider]  Tiotropium Bromide Monohydrate (SPIRIVA RESPIMAT) 2.5 MCG/ACT AERS Inhale 2 puffs into the lungs daily.   Yes [provider]  cephALEXin (KEFLEX) 500 MG capsule Take 1 capsule (500 mg total) by mouth 2 (two) times daily. 04/13/20   Orpah Greek, MD  phenazopyridine (PYRIDIUM) 200 MG tablet Take 1 tablet (200 mg total) by mouth 3 (three) times daily as needed (urinary frequency). 04/13/20   Orpah Greek, MD    Allergies     Patient has no known allergies.  Review of Systems   Review of Systems  Genitourinary: Positive for frequency and urgency.  All other systems reviewed and are negative.   Physical Exam Updated Vital Signs BP (!) 178/99 (BP Location: Right Arm)   Pulse 80   Temp 98.3 F (36.8 C) (Oral)   Resp 18   SpO2 100%   Physical Exam Vitals and nursing note reviewed.  Constitutional:      General: He is not in acute distress.    Appearance: Normal appearance. He is well-developed.  HENT:     Head: Normocephalic and atraumatic.     Right Ear: Hearing normal.     Left Ear: Hearing normal.     Nose: Nose normal.  Eyes:     Conjunctiva/sclera: Conjunctivae normal.     Pupils: Pupils are equal, round, and reactive to light.  Cardiovascular:     Rate and Rhythm: Regular rhythm.     Heart sounds: S1 normal and S2 normal. No murmur heard.  No friction rub. No gallop.   Pulmonary:     Effort: Pulmonary effort is normal. No respiratory distress.     Breath sounds: Normal breath sounds.  Chest:     Chest wall: No tenderness.  Abdominal:     General: Bowel sounds are normal.     Palpations: Abdomen is soft.     Tenderness: There is no abdominal tenderness. There is no guarding or rebound. Negative signs include Murphy's sign and McBurney's sign.     Hernia: No hernia is present.  Musculoskeletal:        General: Normal range of motion.     Cervical back: Normal range of motion and neck supple.  Skin:    General: Skin is warm and dry.     Findings: No rash.  Neurological:     Mental Status: He is alert and oriented to person, place, and time.     GCS: GCS eye subscore is 4. GCS verbal subscore is 5. GCS motor subscore is 6.     Cranial Nerves: No cranial nerve deficit.     Sensory: No sensory deficit.     Coordination: Coordination normal.  Psychiatric:  Speech: Speech normal.        Behavior: Behavior normal.        Thought Content: Thought content normal.     ED  Results / Procedures / Treatments   Labs (all labs ordered are listed, but only abnormal results are displayed) Labs Reviewed  URINALYSIS, ROUTINE W REFLEX MICROSCOPIC - Abnormal; Notable for the following components:      Result Value   Color, Urine COLORLESS (*)    Specific Gravity, Urine 1.004 (*)    Hgb urine dipstick SMALL (*)    Leukocytes,Ua TRACE (*)    All other components within normal limits  BASIC METABOLIC PANEL - Abnormal; Notable for the following components:   Sodium 133 (*)    CO2 19 (*)    Glucose, Bld 131 (*)    Creatinine, Ser 1.78 (*)    Calcium 8.8 (*)    GFR, Estimated 38 (*)    All other components within normal limits  URINE CULTURE    EKG None  Radiology No results found.  Procedures Procedures (including critical care time)  Medications Ordered in ED Medications  belladonna-opium (B&O) suppository 16.2-30 mg (1 suppository Rectal Given 04/13/20 0458)  oxyCODONE-acetaminophen (PERCOCET/ROXICET) 5-325 MG per tablet 1 tablet (1 tablet Oral Given 04/13/20 9767)    ED Course  I have reviewed the triage vital signs and the nursing notes.  Pertinent labs & imaging results that were available during my care of the patient were reviewed by me and considered in my medical decision making (see chart for details).    MDM Rules/Calculators/A&P                          Patient presents to the emergency department for evaluation of urinary frequency.  Patient has had some complaints of mild dysuria and urgency as well.  Patient had rotator cuff surgery earlier today.  His initial urinalysis does not look particularly abnormal.  Will culture.  Additional considerations would be bladder spasm from medications secondary to surgery including anesthesia.  Additionally, he reports that he has had a fair amount of urine each time he urinates, perhaps he simply was given fluids during surgery.  Patient administered B&O suppository.  Lab work unremarkable.  No flank  pain, or really any pain, to suggest ureterolithiasis or intra-abdominal process.  Final Clinical Impression(s) / ED Diagnoses Final diagnoses:  Urinary frequency    Rx / DC Orders ED Discharge Orders         Ordered    cephALEXin (KEFLEX) 500 MG capsule  2 times daily        04/13/20 0536    phenazopyridine (PYRIDIUM) 200 MG tablet  3 times daily PRN        04/13/20 0536           Orpah Greek, MD 04/13/20 [phone]

## 2020-04-13 NOTE — ED Notes (Signed)
Pt's calling his wife for a ride home

## 2020-04-13 NOTE — Op Note (Signed)
Gregory Christensen, RAISANEN MEDICAL RECORD [id number] ACCOUNT [id number] DATE OF BIRTH:July 01, 1951 FACILITY: [facility] LOCATION: [facility] PHYSICIAN:Lessa Huge Randel Pigg, MD  OPERATIVE REPORT  DATE OF PROCEDURE:  04/12/2020  PREOPERATIVE DIAGNOSES:  Left shoulder rotator cuff tear, SLAP tear, and bursitis.  POSTOPERATIVE DIAGNOSES:  Left shoulder rotator cuff tear, SLAP tear and bursitis.  PROCEDURE:  Left shoulder arthroscopy with debridement of the superior labrum, release of the biceps tendon, biceps tenodesis and mini open rotator cuff tear repair.  SURGEON:  Meredith Pel, MD  ASSISTANT:  Annie Main, PA  INDICATIONS:  The patient is a 69 year old patient with left shoulder pain of many months' duration, who presents for operative management after explanation of risks and benefits.  PROCEDURE IN DETAIL:  The patient was brought to the operating room where general anesthetic was induced.  Preoperative antibiotics administered.  Timeout was called.  Left shoulder was prescrubbed with alcohol and Betadine, allowed to air dry.  Prepped  with DuraPrep solution and draped in a sterile manner.  The patient had good external rotation and isolated glenohumeral abduction on the left shoulder.  [name] was used to cover the axilla and seal the operative field.  Posterior portal was created 2 cm  medial and inferior to the posterolateral margin of acromion.  Diagnostic arthroscopy was performed.  The patient had anterior superior labral tear with some early synovitis in the shoulder.  Labral tear and superior labrum was debrided with the  Arthrocare wand.  Biceps tendon was released.  The articular surface of the rotator cuff tear had some fraying and the partial thickness tear was visible.  Following debridement and biceps tendon release, instruments were removed from the posterior and  anterior portals which were closed using 3-0 nylon suture.  Next, Ioban used to cover the entire operative field.   Incision was made off the anterolateral margin of acromion.  The deltoid was split a measured distance of 4 cm and marked with #1 Vicryl  suture.  Bursectomy performed.  Acromioplasty also performed.  Biceps tendon was tenodesed in the bicipital groove using 2 Arthrex knotless SutureTak anchors.  This gave very secure fixation under appropriate tension.  Attention was then directed towards  the rotator cuff tear.  This was more of a degenerative rotator cuff tear with poor tissue.  Poor tissue from the supraspinatus was removed and what was left was about a quarter-sized tear.  Two SutureTape anchors from Arthrex were placed, margin  convergence with 2-0 FiberWire x4 was performed.  0 Vicryl suture was also placed in the edge of the tendon.  The SutureTapes were then tied on the medial row, crossed and then all sutures were incorporated in split fashion into 2 SwiveLocks to give a  watertight repair.  This arm was taken through a range of motion and found to have excellent range of motion.  Next, thorough irrigation was performed.  Numbing medicine was then applied in the skin edges along within the subacromial space as well as in  the joint itself.  Incision was then closed using #1 Vicryl to close the deltoid split followed by 0 Vicryl, 2-0 Vicryl and 3-0 Monocryl.  Steri-Strips and dressings were applied.  [name] assistance was required at all times in the case for retraction,  opening, closing and mobilization of tissues.  His assistance was a medical necessity.  HN/NUANCE  D:04/12/2020 T:04/13/2020 JOB:[id number]

## 2020-04-15 LAB — URINE CULTURE: Culture: 10000 — AB

## 2020-04-16 ENCOUNTER — Telehealth: Payer: Self-pay | Admitting: Emergency Medicine

## 2020-04-16 NOTE — Telephone Encounter (Signed)
Post ED Visit - Positive Culture Follow-up  Culture report reviewed by antimicrobial stewardship pharmacist: [facility] Team []  Elenor Quinones, Pharm.D. []  Heide Guile, Pharm.D., BCPS AQ-ID []  Parks Neptune, Pharm.D., BCPS []  Alycia Rossetti, Pharm.D., BCPS []  [place], Pharm.D., BCPS, AAHIVP []  Legrand Como, Pharm.D., BCPS, AAHIVP []  Salome Arnt, PharmD, BCPS []  Johnnette Gourd, PharmD, BCPS []  Hughes Better, PharmD, BCPS []  Leeroy Cha, PharmD []  Laqueta Linden, PharmD, BCPS []  Albertina Parr, PharmD  [place] Team []  Leodis Sias, PharmD []  Lindell Spar, PharmD []  Royetta Asal, PharmD []  Graylin Shiver, Rph []  Rema Fendt) Glennon Mac, PharmD []  Arlyn Dunning, PharmD []  Netta Cedars, PharmD []  Dia Sitter, PharmD []  Leone Haven, PharmD []  [name], PharmD []  Theodis Shove, PharmD [x]  Peggyann Juba, PharmD []  Reuel Boom, PharmD   Positive urine culture Symptom check, patient reports improved and doing well,  no further patient follow-up is required at this time.  Sandi Raveling Kasin Tonkinson 04/16/2020, 2:25 PM

## 2020-04-16 NOTE — Progress Notes (Signed)
ED Antimicrobial Stewardship Positive Culture Follow Up   Gregory Christensen is an 69 y.o. male who presented to [facility] on 04/13/2020 with a chief complaint of urinary frequency/urgency.  Recent Results (from the past 720 hour(s))  SARS CORONAVIRUS 2 (TAT 6-24 HRS) Nasopharyngeal Nasopharyngeal Swab     Status: None   Collection Time: 04/08/20 10:11 AM   Specimen: Nasopharyngeal Swab  Result Value Ref Range Status   SARS Coronavirus 2 NEGATIVE NEGATIVE Final    Comment: (NOTE) SARS-CoV-2 target nucleic acids are NOT DETECTED.  The SARS-CoV-2 RNA is generally detectable in upper and lower respiratory specimens during the acute phase of infection. Negative results do not preclude SARS-CoV-2 infection, do not rule out co-infections with other pathogens, and should not be used as the sole basis for treatment or other patient management decisions. Negative results must be combined with clinical observations, patient history, and epidemiological information. The expected result is Negative.  Fact Sheet for Patients: SugarRoll.be  Fact Sheet for Healthcare Providers: https://www.woods-mathews.com/  This test is not yet approved or cleared by the [place] FDA and  has been authorized for detection and/or diagnosis of SARS-CoV-2 by FDA under an Emergency Use Authorization (EUA). This EUA will remain  in effect (meaning this test can be used) for the duration of the COVID-19 declaration under Se ction 564(b)(1) of the Act, 21 U.S.C. section 360bbb-3(b)(1), unless the authorization is terminated or revoked sooner.  Performed at [facility], [place] [address] [place], [place] [address]   Urine Culture     Status: Abnormal   Collection Time: 04/13/20  3:43 AM   Specimen: Urine, Random  Result Value Ref Range Status   Specimen Description   Final    URINE, RANDOM Performed at [facility] [address],  [place], [place] [address]    Special Requests   Final    NONE Performed at [facility], [place] [address], [place], [place] [address]    Culture 10,000 COLONIES/mL PSEUDOMONAS AERUGINOSA (A)  Final   Report Status 04/15/2020 FINAL  Final   Organism ID, Bacteria PSEUDOMONAS AERUGINOSA (A)  Final      Susceptibility   Pseudomonas aeruginosa - MIC*    CEFTAZIDIME <=1 SENSITIVE Sensitive     CIPROFLOXACIN 1 SENSITIVE Sensitive     GENTAMICIN <=1 SENSITIVE Sensitive     IMIPENEM <=0.25 SENSITIVE Sensitive     PIP/TAZO <=4 SENSITIVE Sensitive     CEFEPIME 0.5 SENSITIVE Sensitive     * 10,000 COLONIES/mL PSEUDOMONAS AERUGINOSA    [x]  Treated with cephalexin, organism resistant to prescribed antimicrobial  New antibiotic prescription: Query patient for UTI symptoms, if present - start Cipro 500mg  PO bid x 5 days  ED Provider: Dewayne Shorter 04/16/2020, 9:42 AM Clinical Pharmacist [phone]

## 2020-04-21 ENCOUNTER — Telehealth: Payer: Self-pay | Admitting: Orthopedic Surgery

## 2020-04-21 ENCOUNTER — Other Ambulatory Visit: Payer: Self-pay

## 2020-04-21 ENCOUNTER — Other Ambulatory Visit: Payer: Self-pay | Admitting: Surgical

## 2020-04-21 ENCOUNTER — Ambulatory Visit (INDEPENDENT_AMBULATORY_CARE_PROVIDER_SITE_OTHER): Payer: No Typology Code available for payment source | Admitting: Orthopedic Surgery

## 2020-04-21 DIAGNOSIS — M75122 Complete rotator cuff tear or rupture of left shoulder, not specified as traumatic: Secondary | ICD-10-CM

## 2020-04-21 MED ORDER — OXYCODONE-ACETAMINOPHEN 5-325 MG PO TABS
1.0000 | ORAL_TABLET | Freq: Four times a day (QID) | ORAL | 0 refills | Status: DC | PRN
Start: 2020-04-21 — End: 2020-05-06

## 2020-04-21 MED ORDER — METHOCARBAMOL 500 MG PO TABS
500.0000 mg | ORAL_TABLET | Freq: Three times a day (TID) | ORAL | 0 refills | Status: DC | PRN
Start: 2020-04-21 — End: 2020-05-06

## 2020-04-21 NOTE — Telephone Encounter (Signed)
Pt called stating he had an appt this morning and his rx aren't at the pharmacy pt states he believe one rx was for oxycodone and he's not sure what the other would've been  [phone]

## 2020-04-21 NOTE — Telephone Encounter (Signed)
Sbmitted RX, thank you

## 2020-04-21 NOTE — Telephone Encounter (Signed)
Please advise. Thanks.  

## 2020-04-22 NOTE — Telephone Encounter (Signed)
I called and sw pt to advise rx has been submitted to pharm.

## 2020-04-23 ENCOUNTER — Encounter: Payer: Self-pay | Admitting: Orthopedic Surgery

## 2020-04-23 NOTE — Progress Notes (Signed)
Post-Op Visit Note   Patient: Gregory Christensen           Date of Birth: Dec 26, 1950           MRN: [id number] Visit Date: 04/21/2020 PCP: Leighton Ruff, MD   Assessment & Plan:  Chief Complaint:  Chief Complaint  Patient presents with  . Left Shoulder - Routine Post Op   Visit Diagnoses:  1. Complete tear of left rotator cuff, unspecified whether traumatic     Plan: Patient is a 69 year old male presents s/p left shoulder arthroscopy with mini open rotator cuff repair on 04/12/2020.  Patient states that he is doing very well and his pain is well controlled.  He denies any fevers, chills, drainage, malaise.  He has remained in sling and states that he is not lifting anything heavy with the left arm.  He is taking oxycodone for pain control but only has to take it occasionally.  On exam his incisions are healing well.  Sutures were removed and replaced with Steri-Strips.  He has 30 degrees external rotation, 90 degrees abduction, 125 degrees forward flexion.  Plan for patient to remain in sling and hold off on lifting anything heavier than 1 or 2 pounds with the left arm.  Follow-up in 2 weeks for clinical recheck.  He will start physical therapy upstairs at that point.  Follow-Up Instructions: No follow-ups on file.   Orders:  No orders of the defined types were placed in this encounter.  No orders of the defined types were placed in this encounter.   Imaging: No results found.  PMFS History: Patient Active Problem List   Diagnosis Date Noted  . Benign neoplasm of transverse colon   . Benign neoplasm of descending colon   . Benign neoplasm of rectum   . Heme positive stool   . Iron deficiency anemia due to chronic blood loss   . Melena   . Abdominal pain, chronic, right lower quadrant   . Chronic anticoagulation   . Symptomatic anemia 09/24/2019  . Pneumonia due to severe acute respiratory syndrome coronavirus 2 (SARS-CoV-2) 02/28/2019  . Renal mass 10/31/2018  . Chest  pressure 10/15/2018  . Coronary artery calcification 10/15/2018  . Respiratory failure, acute-on-chronic ([place]) 08/03/2018  . Acute deep vein thrombosis (DVT) of left lower extremity ([place])   . Acute hypoxemic respiratory failure ([place])   . Pulmonary embolism ([place]) 05/31/2018  . COPD exacerbation ([place]) 10/18/2017  . Hypoxia   . Respiratory distress   . Essential hypertension    Past Medical History:  Diagnosis Date  . Acute deep vein thrombosis (DVT) of left lower extremity ([place])   . Acute hypoxemic respiratory failure ([place])   . Anemia    Iron deficiency  . Anxiety   . Aortic atherosclerosis ([place])   . Arthritis   . Benign prostatic hyperplasia 08/28/2018   UNSPECIFIED WHETHER LOWER URINARY TRACT SYMPTOMS PRESENT  . CAD (coronary artery disease)   . Cancer ([place])   . Chronic back pain   . Chronic hip pain   . Constipation    due to Iron M-W-F  . COPD exacerbation ([place]) 10/18/2017  . Dyspnea   . Emphysema lung ([place])   . Essential hypertension   . GERD (gastroesophageal reflux disease)   . Gout   . Gross hematuria 09/08/2018  . Head injury, acute, with loss of consciousness ([place])    unsure how long he was unresponsive  . History of blood transfusion 05/2019  . History of kidney stones   .  History of tobacco abuse   . Hypoxia   . Insomnia   . 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 Pneumonia    in 1980's  . Pre-diabetes   . Pulmonary embolism ([place]) 05/31/2018  . Renal mass    CONCERNING FOR RENAL CELL CARCINOMA DR. Lovena Neighbours  . Respiratory distress   . Respiratory failure, acute-on-chronic ([place]) 08/03/2018  . Seasonal allergies   . Sleep apnea     Family History  Problem Relation Age of Onset  . Diabetes Mother   . Hypertension Sister   . Hypertension Brother     Past Surgical History:  Procedure Laterality Date  . COLONOSCOPY WITH PROPOFOL N/A 09/27/2019   Procedure: COLONOSCOPY WITH PROPOFOL;  Surgeon: Irene Shipper, MD;  [facility]: [facility];  Service: Endoscopy;   Laterality: N/A;  . CYSTOSCOPY/URETEROSCOPY/HOLMIUM LASER/STENT PLACEMENT Left 10/31/2018   Procedure: CYSTOSCOPY/URETEROSCOPY;  Surgeon: Ceasar Mons, MD;  Location: [facility];  Service: Urology;  Laterality: Left;  . ESOPHAGOGASTRODUODENOSCOPY (EGD) WITH PROPOFOL N/A 09/27/2019   Procedure: ESOPHAGOGASTRODUODENOSCOPY (EGD) WITH PROPOFOL;  Surgeon: Irene Shipper, MD;  [facility];  Service: Endoscopy;  Laterality: N/A;  . HERNIA REPAIR Left 1977   inguinal  . HYDROCELE EXCISION Left 05/13/2019   Procedure: HYDROCELECTOMY ADULT;  Surgeon: Ceasar Mons, MD;  [facility];  Service: Urology;  Laterality: Left;  Marland Kitchen MANDIBLE SURGERY  1976   EXTENSIVE  . POLYPECTOMY  09/27/2019   Procedure: POLYPECTOMY;  Surgeon: Irene Shipper, MD;  Location: [facility];  Service: Endoscopy;;  . ROBOT ASSITED LAPAROSCOPIC NEPHROURETERECTOMY Left 10/31/2018   Procedure: XI ROBOT ASSITED LAPAROSCOPIC NEPHROURETERECTOMY;  Surgeon: Ceasar Mons, MD;  [facility];  Service: Urology;  Laterality: Left;  ONLY NEEDS 240 MIN FOR ALL PROCEDURES  . SHOULDER ARTHROSCOPY WITH ROTATOR CUFF REPAIR Left 04/12/2020   Procedure: LEFT SHOULDER ARTHROSCOPY, DEBRIDEMENT, BICEPS TENODESIS, MINI OPEN ROTATOR CUFF TEAR REPAIR;  Surgeon: Meredith Pel, MD;  Location: [facility];  Service: Orthopedics;  Laterality: Left;   Social History   Occupational History  . Not on file  Tobacco Use  . Smoking status: Current Every Day Smoker    Packs/day: 0.50    Years: 57.00    Pack years: 28.50    Types: Cigarettes  . Smokeless tobacco: Never Used  Vaping Use  . Vaping Use: Never used  Substance and Sexual Activity  . Alcohol use: Not Currently    Alcohol/week: 10.0 standard drinks    Types: 10 Glasses of wine per week    Comment: 0-2 beers a day   . Drug use: Not Currently    Types: Flunitrazepam  . Sexual activity: Not on file

## 2020-04-24 DIAGNOSIS — N183 Chronic kidney disease, stage 3 unspecified: Secondary | ICD-10-CM | POA: Diagnosis not present

## 2020-04-24 DIAGNOSIS — I2510 Atherosclerotic heart disease of native coronary artery without angina pectoris: Secondary | ICD-10-CM | POA: Diagnosis not present

## 2020-04-24 DIAGNOSIS — N400 Enlarged prostate without lower urinary tract symptoms: Secondary | ICD-10-CM | POA: Diagnosis not present

## 2020-04-24 DIAGNOSIS — J449 Chronic obstructive pulmonary disease, unspecified: Secondary | ICD-10-CM | POA: Diagnosis not present

## 2020-04-24 DIAGNOSIS — M1990 Unspecified osteoarthritis, unspecified site: Secondary | ICD-10-CM | POA: Diagnosis not present

## 2020-04-24 DIAGNOSIS — J441 Chronic obstructive pulmonary disease with (acute) exacerbation: Secondary | ICD-10-CM | POA: Diagnosis not present

## 2020-04-24 DIAGNOSIS — E7800 Pure hypercholesterolemia, unspecified: Secondary | ICD-10-CM | POA: Diagnosis not present

## 2020-04-24 DIAGNOSIS — I10 Essential (primary) hypertension: Secondary | ICD-10-CM | POA: Diagnosis not present

## 2020-04-24 DIAGNOSIS — K219 Gastro-esophageal reflux disease without esophagitis: Secondary | ICD-10-CM | POA: Diagnosis not present

## 2020-04-25 DIAGNOSIS — N183 Chronic kidney disease, stage 3 unspecified: Secondary | ICD-10-CM | POA: Diagnosis not present

## 2020-05-02 DIAGNOSIS — I129 Hypertensive chronic kidney disease with stage 1 through stage 4 chronic kidney disease, or unspecified chronic kidney disease: Secondary | ICD-10-CM | POA: Diagnosis not present

## 2020-05-02 DIAGNOSIS — N183 Chronic kidney disease, stage 3 unspecified: Secondary | ICD-10-CM | POA: Diagnosis not present

## 2020-05-05 DIAGNOSIS — J441 Chronic obstructive pulmonary disease with (acute) exacerbation: Secondary | ICD-10-CM | POA: Diagnosis not present

## 2020-05-06 ENCOUNTER — Encounter: Payer: Self-pay | Admitting: Orthopedic Surgery

## 2020-05-06 ENCOUNTER — Other Ambulatory Visit: Payer: Self-pay

## 2020-05-06 ENCOUNTER — Ambulatory Visit (INDEPENDENT_AMBULATORY_CARE_PROVIDER_SITE_OTHER): Payer: No Typology Code available for payment source | Admitting: Orthopedic Surgery

## 2020-05-06 ENCOUNTER — Emergency Department (HOSPITAL_COMMUNITY)
Admission: EM | Admit: 2020-05-06 | Discharge: 2020-05-07 | Disposition: A | Discharge status: Home / Self Care | Payer: No Typology Code available for payment source | Attending: Emergency Medicine | Admitting: Emergency Medicine

## 2020-05-06 VITALS — Ht 73.0 in | Wt 162.0 lb

## 2020-05-06 DIAGNOSIS — I10 Essential (primary) hypertension: Secondary | ICD-10-CM | POA: Insufficient documentation

## 2020-05-06 DIAGNOSIS — I2510 Atherosclerotic heart disease of native coronary artery without angina pectoris: Secondary | ICD-10-CM | POA: Insufficient documentation

## 2020-05-06 DIAGNOSIS — Z859 Personal history of malignant neoplasm, unspecified: Secondary | ICD-10-CM | POA: Diagnosis not present

## 2020-05-06 DIAGNOSIS — Z20822 Contact with and (suspected) exposure to covid-19: Secondary | ICD-10-CM | POA: Insufficient documentation

## 2020-05-06 DIAGNOSIS — Z79899 Other long term (current) drug therapy: Secondary | ICD-10-CM | POA: Insufficient documentation

## 2020-05-06 DIAGNOSIS — R0789 Other chest pain: Secondary | ICD-10-CM | POA: Diagnosis not present

## 2020-05-06 DIAGNOSIS — R0602 Shortness of breath: Secondary | ICD-10-CM | POA: Diagnosis not present

## 2020-05-06 DIAGNOSIS — Z7951 Long term (current) use of inhaled steroids: Secondary | ICD-10-CM | POA: Insufficient documentation

## 2020-05-06 DIAGNOSIS — Z7901 Long term (current) use of anticoagulants: Secondary | ICD-10-CM | POA: Insufficient documentation

## 2020-05-06 DIAGNOSIS — F17210 Nicotine dependence, cigarettes, uncomplicated: Secondary | ICD-10-CM | POA: Diagnosis not present

## 2020-05-06 DIAGNOSIS — R0902 Hypoxemia: Secondary | ICD-10-CM | POA: Diagnosis not present

## 2020-05-06 DIAGNOSIS — M75122 Complete rotator cuff tear or rupture of left shoulder, not specified as traumatic: Secondary | ICD-10-CM

## 2020-05-06 DIAGNOSIS — J441 Chronic obstructive pulmonary disease with (acute) exacerbation: Secondary | ICD-10-CM | POA: Diagnosis not present

## 2020-05-06 DIAGNOSIS — R079 Chest pain, unspecified: Secondary | ICD-10-CM | POA: Diagnosis not present

## 2020-05-06 DIAGNOSIS — R0600 Dyspnea, unspecified: Secondary | ICD-10-CM | POA: Diagnosis not present

## 2020-05-06 MED ORDER — OXYCODONE-ACETAMINOPHEN 5-325 MG PO TABS
1.0000 | ORAL_TABLET | Freq: Three times a day (TID) | ORAL | 0 refills | Status: DC | PRN
Start: 2020-05-06 — End: 2020-05-24

## 2020-05-06 MED ORDER — METHOCARBAMOL 500 MG PO TABS
500.0000 mg | ORAL_TABLET | Freq: Three times a day (TID) | ORAL | 0 refills | Status: DC | PRN
Start: 2020-05-06 — End: 2020-05-24

## 2020-05-06 NOTE — Progress Notes (Signed)
Post-Op Visit Note   Patient: Gregory Christensen           Date of Birth: [birth year]           MRN: [id number] Visit Date: 05/06/2020 PCP: Leighton Ruff, MD   Assessment & Plan:  Chief Complaint:  Chief Complaint  Patient presents with  . Left Shoulder - Follow-up    04/12/2020 left shoulder scope, MORCTR   Visit Diagnoses:  1. Complete tear of left rotator cuff, unspecified whether traumatic     Plan: Patient is a 69 year old male who presents s/p left shoulder mini open rotator cuff repair on 04/12/2020.  He is doing well overall.  He notes increased pain at 3 or 4 in the morning.  He takes oxycodone and Robaxin for pain control.  He has not started physical therapy yet.  On exam he does have a Popeye deformity that is new.  He also has 45 degrees external rotation, 95 degrees abduction, 120 degrees forward flexion.  He has some very mild anterior crepitus with passive range of motion of the left shoulder.  Excellent supraspinatus, infraspinatus, subscapularis strength for where he is at in his recovery.  Plan to discontinue the sling today.  No lifting before Thanksgiving.  Start physical therapy upstairs to work on left shoulder passive range of motion and active assisted range of motion.  No rotator cuff strengthening until about 05/30/2020.  Follow-up with the office in 6 weeks for clinical recheck.  Follow-Up Instructions: No follow-ups on file.   Orders:  Orders Placed This Encounter  Procedures  . Ambulatory referral to Physical Therapy   Meds ordered this encounter  Medications  . oxyCODONE-acetaminophen (PERCOCET) 5-325 MG tablet    Sig: Take 1 tablet by mouth every 8 (eight) hours as needed for severe pain.    Dispense:  30 tablet    Refill:  0  . methocarbamol (ROBAXIN) 500 MG tablet    Sig: Take 1 tablet (500 mg total) by mouth every 8 (eight) hours as needed.    Dispense:  30 tablet    Refill:  0    Imaging: No results found.  PMFS History: Patient Active  Problem List   Diagnosis Date Noted  . Benign neoplasm of transverse colon   . Benign neoplasm of descending colon   . Benign neoplasm of rectum   . Heme positive stool   . Iron deficiency anemia due to chronic blood loss   . Melena   . Abdominal pain, chronic, right lower quadrant   . Chronic anticoagulation   . Symptomatic anemia 09/24/2019  . Pneumonia due to severe acute respiratory syndrome coronavirus 2 (SARS-CoV-2) 02/28/2019  . Renal mass 10/31/2018  . Chest pressure 10/15/2018  . Coronary artery calcification 10/15/2018  . Respiratory failure, acute-on-chronic ([place]) 08/03/2018  . Acute deep vein thrombosis (DVT) of left lower extremity ([place])   . Acute hypoxemic respiratory failure ([place])   . Pulmonary embolism ([place]) 05/31/2018  . COPD exacerbation ([place]) 10/18/2017  . Hypoxia   . Respiratory distress   . Essential hypertension    Past Medical History:  Diagnosis Date  . Acute deep vein thrombosis (DVT) of left lower extremity ([place])   . Acute hypoxemic respiratory failure ([place])   . Anemia    Iron deficiency  . Anxiety   . Aortic atherosclerosis ([place])   . Arthritis   . Benign prostatic hyperplasia 08/28/2018   UNSPECIFIED WHETHER LOWER URINARY TRACT SYMPTOMS PRESENT  . CAD (coronary artery disease)   .  Cancer ([place])   . Chronic back pain   . Chronic hip pain   . Constipation    due to Iron M-W-F  . COPD exacerbation ([place]) 10/18/2017  . Dyspnea   . Emphysema lung ([place])   . Essential hypertension   . GERD (gastroesophageal reflux disease)   . Gout   . Gross hematuria 09/08/2018  . Head injury, acute, with loss of consciousness ([place])    unsure how long he was unresponsive  . History of blood transfusion 05/2019  . History of kidney stones   . History of tobacco abuse   . Hypoxia   . Insomnia   . 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 Pneumonia    in 1980's  . Pre-diabetes   . Pulmonary embolism ([place]) 05/31/2018  . Renal mass    CONCERNING FOR RENAL CELL  CARCINOMA DR. Lovena Neighbours  . Respiratory distress   . Respiratory failure, acute-on-chronic ([place]) 08/03/2018  . Seasonal allergies   . Sleep apnea     Family History  Problem Relation Age of Onset  . Diabetes Mother   . Hypertension Sister   . Hypertension Brother     Past Surgical History:  Procedure Laterality Date  . COLONOSCOPY WITH PROPOFOL N/A 09/27/2019   Procedure: COLONOSCOPY WITH PROPOFOL;  Surgeon: Irene Shipper, MD;  [facility];  Service: Endoscopy;  Laterality: N/A;  . CYSTOSCOPY/URETEROSCOPY/HOLMIUM LASER/STENT PLACEMENT Left 10/31/2018   Procedure: CYSTOSCOPY/URETEROSCOPY;  Surgeon: Ceasar Mons, MD;  [facility];  Service: Urology;  Laterality: Left;  . ESOPHAGOGASTRODUODENOSCOPY (EGD) WITH PROPOFOL N/A 09/27/2019   Procedure: ESOPHAGOGASTRODUODENOSCOPY (EGD) WITH PROPOFOL;  Surgeon: Irene Shipper, MD;  [facility];  Service: Endoscopy;  Laterality: N/A;  . HERNIA REPAIR Left 1977   inguinal  . HYDROCELE EXCISION Left 05/13/2019   Procedure: HYDROCELECTOMY ADULT;  Surgeon: Ceasar Mons, MD;  [facility];  Service: Urology;  Laterality: Left;  Marland Kitchen MANDIBLE SURGERY  1976   EXTENSIVE  . POLYPECTOMY  09/27/2019   Procedure: POLYPECTOMY;  Surgeon: Irene Shipper, MD;  [facility];  Service: Endoscopy;;  . ROBOT ASSITED LAPAROSCOPIC NEPHROURETERECTOMY Left 10/31/2018   Procedure: XI ROBOT ASSITED LAPAROSCOPIC NEPHROURETERECTOMY;  Surgeon: Ceasar Mons, MD;  [facility];  Service: Urology;  Laterality: Left;  ONLY NEEDS 240 MIN FOR ALL PROCEDURES  . SHOULDER ARTHROSCOPY WITH ROTATOR CUFF REPAIR Left 04/12/2020   Procedure: LEFT SHOULDER ARTHROSCOPY, DEBRIDEMENT, BICEPS TENODESIS, MINI OPEN ROTATOR CUFF TEAR REPAIR;  Surgeon: Meredith Pel, MD;  Location: [facility];  Service: Orthopedics;  Laterality: Left;   Social History   Occupational History  . Not on file  Tobacco Use  . Smoking status:  Current Every Day Smoker    Packs/day: 0.50    Years: 57.00    Pack years: 28.50    Types: Cigarettes  . Smokeless tobacco: Never Used  Vaping Use  . Vaping Use: Never used  Substance and Sexual Activity  . Alcohol use: Not Currently    Alcohol/week: 10.0 standard drinks    Types: 10 Glasses of wine per week    Comment: 0-2 beers a day   . Drug use: Not Currently    Types: Flunitrazepam  . Sexual activity: Not on file

## 2020-05-07 ENCOUNTER — Other Ambulatory Visit: Payer: Self-pay

## 2020-05-07 ENCOUNTER — Emergency Department (HOSPITAL_COMMUNITY): Payer: No Typology Code available for payment source

## 2020-05-07 LAB — CBC WITH DIFFERENTIAL/PLATELET
Abs Immature Granulocytes: 0.03 10*3/uL (ref 0.00–0.07)
Basophils Absolute: 0 10*3/uL (ref 0.0–0.1)
Basophils Relative: 0 %
Eosinophils Absolute: 0.1 10*3/uL (ref 0.0–0.5)
Eosinophils Relative: 1 %
HCT: 44.6 % (ref 39.0–52.0)
Hemoglobin: 14 g/dL (ref 13.0–17.0)
Immature Granulocytes: 0 %
Lymphocytes Relative: 22 %
Lymphs Abs: 1.5 10*3/uL (ref 0.7–4.0)
MCH: 29 pg (ref 26.0–34.0)
MCHC: 31.4 g/dL (ref 30.0–36.0)
MCV: 92.3 fL (ref 80.0–100.0)
Monocytes Absolute: 0.5 10*3/uL (ref 0.1–1.0)
Monocytes Relative: 7 %
Neutro Abs: 4.7 10*3/uL (ref 1.7–7.7)
Neutrophils Relative %: 70 %
Platelets: 225 10*3/uL (ref 150–400)
RBC: 4.83 MIL/uL (ref 4.22–5.81)
RDW: 13.6 % (ref 11.5–15.5)
WBC: 6.8 10*3/uL (ref 4.0–10.5)
nRBC: 0 % (ref 0.0–0.2)

## 2020-05-07 LAB — TROPONIN I (HIGH SENSITIVITY)
Troponin I (High Sensitivity): 5 ng/L (ref <18)
Troponin I (High Sensitivity): 4 ng/L (ref <18)

## 2020-05-07 LAB — RESPIRATORY PANEL BY RT PCR (FLU A&B, COVID)
Influenza A by PCR: NEGATIVE
Influenza B by PCR: NEGATIVE
SARS Coronavirus 2 by RT PCR: NEGATIVE

## 2020-05-07 LAB — BASIC METABOLIC PANEL
Anion gap: 10 (ref 5–15)
BUN: 16 mg/dL (ref 8–23)
CO2: 22 mmol/L (ref 22–32)
Calcium: 9.3 mg/dL (ref 8.9–10.3)
Chloride: 102 mmol/L (ref 98–111)
Creatinine, Ser: 1.68 mg/dL — ABNORMAL HIGH (ref 0.61–1.24)
GFR, Estimated: 44 mL/min — ABNORMAL LOW (ref >60)
Glucose, Bld: 118 mg/dL — ABNORMAL HIGH (ref 70–99)
Potassium: 4.1 mmol/L (ref 3.5–5.1)
Sodium: 134 mmol/L — ABNORMAL LOW (ref 135–145)

## 2020-05-07 LAB — BRAIN NATRIURETIC PEPTIDE: B Natriuretic Peptide: 16.4 pg/mL (ref 0.0–100.0)

## 2020-05-07 MED ORDER — DOXYCYCLINE HYCLATE 100 MG PO CAPS: 100.0000 mg | ORAL_CAPSULE | Freq: Two times a day (BID) | ORAL | 0 refills | Status: DC

## 2020-05-07 MED ORDER — PREDNISONE 20 MG PO TABS: 40.0000 mg | ORAL_TABLET | Freq: Every day | ORAL | 0 refills | Status: DC

## 2020-05-07 MED ORDER — IPRATROPIUM-ALBUTEROL 0.5-2.5 (3) MG/3ML IN SOLN
3.0000 mL | Freq: Once | RESPIRATORY_TRACT | Status: AC
  Administered 2020-05-07: 3 mL via RESPIRATORY_TRACT
  Filled 2020-05-07: qty 3

## 2020-05-07 MED ORDER — ONDANSETRON HCL 4 MG/2ML IJ SOLN
4.0000 mg | Freq: Once | INTRAMUSCULAR | Status: AC
  Administered 2020-05-07: 4 mg via INTRAVENOUS
  Filled 2020-05-07: qty 2

## 2020-05-07 NOTE — ED Notes (Signed)
Assumed patient care.

## 2020-05-07 NOTE — ED Triage Notes (Signed)
Pt from home by EMS; reports SOB and CP; EMS gave 324 ASA, DuoNeb, and SoluMedrol IV.  Pt A&O on arrival.

## 2020-05-07 NOTE — ED Notes (Signed)
Assuming care of patient at this time. Pt in NAD. Resp even and unlabored. Bed in low position. Call bell within reach.

## 2020-05-07 NOTE — ED Provider Notes (Signed)
[facility] EMERGENCY DEPARTMENT Provider Note   CSN: [id number] Arrival date & time: 05/06/20  2357     History Chief Complaint  Patient presents with  . Shortness of Breath  . Chest Pain    Gregory Christensen is a 69 y.o. male.  Patient presents to the emergency department for evaluation of chest pain with difficulty breathing.  Patient reports that he had onset of pain in his chest approximately 15 minutes before calling EMS.  Upon their arrival to the home he was severely dyspneic with audible wheezing.  Patient given aspirin, albuterol, Atrovent and Solu-Medrol during transport.  He arrives still receiving a nebulizer treatment, reports that he is feeling somewhat better.  Pain is resolving and his shortness of breath is improved.        Past Medical History:  Diagnosis Date  . Acute deep vein thrombosis (DVT) of left lower extremity ([place])   . Acute hypoxemic respiratory failure ([place])   . Anemia    Iron deficiency  . Anxiety   . Aortic atherosclerosis ([place])   . Arthritis   . Benign prostatic hyperplasia 08/28/2018   UNSPECIFIED WHETHER LOWER URINARY TRACT SYMPTOMS PRESENT  . CAD (coronary artery disease)   . Cancer ([place])   . Chronic back pain   . Chronic hip pain   . Constipation    due to Iron M-W-F  . COPD exacerbation ([place]) 10/18/2017  . Dyspnea   . Emphysema lung ([place])   . Essential hypertension   . GERD (gastroesophageal reflux disease)   . Gout   . Gross hematuria 09/08/2018  . Head injury, acute, with loss of consciousness ([place])    unsure how long he was unresponsive  . History of blood transfusion 05/2019  . History of kidney stones   . History of tobacco abuse   . Hypoxia   . Insomnia   . 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 Pneumonia    in 1980's  . Pre-diabetes   . Pulmonary embolism ([place]) 05/31/2018  . Renal mass    CONCERNING FOR RENAL CELL CARCINOMA DR. Lovena Neighbours  . Respiratory distress   . Respiratory failure, acute-on-chronic  ([place]) 08/03/2018  . Seasonal allergies   . Sleep apnea     Patient Active Problem List   Diagnosis Date Noted  . Benign neoplasm of transverse colon   . Benign neoplasm of descending colon   . Benign neoplasm of rectum   . Heme positive stool   . Iron deficiency anemia due to chronic blood loss   . Melena   . Abdominal pain, chronic, right lower quadrant   . Chronic anticoagulation   . Symptomatic anemia 09/24/2019  . Pneumonia due to severe acute respiratory syndrome coronavirus 2 (SARS-CoV-2) 02/28/2019  . Renal mass 10/31/2018  . Chest pressure 10/15/2018  . Coronary artery calcification 10/15/2018  . Respiratory failure, acute-on-chronic ([place]) 08/03/2018  . Acute deep vein thrombosis (DVT) of left lower extremity ([place])   . Acute hypoxemic respiratory failure ([place])   . Pulmonary embolism ([place]) 05/31/2018  . COPD exacerbation ([place]) 10/18/2017  . Hypoxia   . Respiratory distress   . Essential hypertension     Past Surgical History:  Procedure Laterality Date  . COLONOSCOPY WITH PROPOFOL N/A 09/27/2019   Procedure: COLONOSCOPY WITH PROPOFOL;  Surgeon: Irene Shipper, MD;  Location: [facility];  Service: Endoscopy;  Laterality: N/A;  . CYSTOSCOPY/URETEROSCOPY/HOLMIUM LASER/STENT PLACEMENT Left 10/31/2018   Procedure: CYSTOSCOPY/URETEROSCOPY;  Surgeon: Ceasar Mons, MD;  Location: [facility];  Service: Urology;  Laterality: Left;  . ESOPHAGOGASTRODUODENOSCOPY (EGD) WITH PROPOFOL N/A 09/27/2019   Procedure: ESOPHAGOGASTRODUODENOSCOPY (EGD) WITH PROPOFOL;  Surgeon: Irene Shipper, MD;  Location: [facility];  Service: Endoscopy;  Laterality: N/A;  . HERNIA REPAIR Left 1977   inguinal  . HYDROCELE EXCISION Left 05/13/2019   Procedure: HYDROCELECTOMY ADULT;  Surgeon: Ceasar Mons, MD;  Location: [facility];  Service: Urology;  Laterality: Left;  Marland Kitchen MANDIBLE SURGERY  1976   EXTENSIVE  . POLYPECTOMY  09/27/2019   Procedure: POLYPECTOMY;  Surgeon: Irene Shipper, MD;   Location: [facility];  Service: Endoscopy;;  . ROBOT ASSITED LAPAROSCOPIC NEPHROURETERECTOMY Left 10/31/2018   Procedure: XI ROBOT ASSITED LAPAROSCOPIC NEPHROURETERECTOMY;  Surgeon: Ceasar Mons, MD;  Location: [facility];  Service: Urology;  Laterality: Left;  ONLY NEEDS 240 MIN FOR ALL PROCEDURES  . SHOULDER ARTHROSCOPY WITH ROTATOR CUFF REPAIR Left 04/12/2020   Procedure: LEFT SHOULDER ARTHROSCOPY, DEBRIDEMENT, BICEPS TENODESIS, MINI OPEN ROTATOR CUFF TEAR REPAIR;  Surgeon: Meredith Pel, MD;  Location: [facility];  Service: Orthopedics;  Laterality: Left;       Family History  Problem Relation Age of Onset  . Diabetes Mother   . Hypertension Sister   . Hypertension Brother     Social History   Tobacco Use  . Smoking status: Current Every Day Smoker    Packs/day: 0.50    Years: 57.00    Pack years: 28.50    Types: Cigarettes  . Smokeless tobacco: Never Used  Vaping Use  . Vaping Use: Never used  Substance Use Topics  . Alcohol use: Not Currently    Alcohol/week: 10.0 standard drinks    Types: 10 Glasses of wine per week    Comment: 0-2 beers a day   . Drug use: Not Currently    Types: Flunitrazepam    Home Medications Prior to Admission medications   Medication Sig Start Date End Date Taking? Authorizing Provider  albuterol (PROVENTIL) (2.5 MG/3ML) 0.083% nebulizer solution Take 3 mLs (2.5 mg total) by nebulization every 6 (six) hours as needed for wheezing or shortness of breath. 08/04/18  Yes Reyne Dumas, MD  amLODipine (NORVASC) 2.5 MG tablet Take 2.5 mg by mouth daily.   Yes [provider]  apixaban (ELIQUIS) 5 MG TABS tablet Take 1 tablet (5 mg total) by mouth 2 (two) times daily. 06/10/18  Yes Regalado, Belkys A, MD  budesonide-formoterol (SYMBICORT) 160-4.5 MCG/ACT inhaler Inhale 1 puff into the lungs 2 (two) times daily. Patient taking differently: Inhale 1 puff into the lungs 2 (two) times daily as needed (shortness of breath).  08/04/18  Yes  Reyne Dumas, MD  Dextran 70-Hypromellose (ARTIFICIAL TEARS) 0.1-0.3 % SOLN Place 1 drop into both eyes daily as needed (dry eyes).   Yes [provider]  diclofenac Sodium (VOLTAREN) 1 % GEL Apply 2 g topically 2 (two) times daily as needed (Pain).   Yes [provider]  docusate sodium (COLACE) 100 MG capsule Take 100 mg by mouth daily as needed for mild constipation.   Yes [provider]  famotidine (PEPCID) 20 MG tablet Take 20 mg by mouth daily.   Yes [provider]  ferrous sulfate 325 (65 FE) MG tablet Take 325 mg by mouth daily with breakfast.   Yes [provider]  fluticasone (FLONASE) 50 MCG/ACT nasal spray Place 1 spray into both nostrils 2 (two) times daily. Patient taking differently: Place 1 spray into both nostrils daily as needed  for allergies.  08/04/18  Yes Reyne Dumas, MD  gabapentin (NEURONTIN) 300 MG capsule Take 400 mg by mouth 3 (three) times daily.    Yes [provider]  lidocaine (LIDODERM) 5 % Place 1 patch onto the skin daily. Remove & Discard patch within 12 hours or as directed by MD   Yes [provider]  loratadine (CLARITIN) 10 MG tablet Take 10 mg by mouth daily as needed for allergies.    Yes [provider]  methocarbamol (ROBAXIN) 500 MG tablet Take 1 tablet (500 mg total) by mouth every 8 (eight) hours as needed. 05/06/20  Yes Magnant, Charles L, PA-C  montelukast (SINGULAIR) 10 MG tablet Take 10 mg by mouth at bedtime.   Yes [provider]  oxyCODONE-acetaminophen (PERCOCET) 5-325 MG tablet Take 1 tablet by mouth every 8 (eight) hours as needed for severe pain. 05/06/20 05/06/21 Yes Magnant, Charles L, PA-C  pantoprazole (PROTONIX) 40 MG tablet Take 40 mg by mouth daily.   Yes [provider]  pravastatin (PRAVACHOL) 20 MG tablet Take 20 mg by mouth at bedtime.   Yes [provider]  tamsulosin (FLOMAX) 0.4 MG CAPS capsule Take 0.4 mg by mouth at bedtime.    Yes  [provider]  Tiotropium Bromide Monohydrate (SPIRIVA RESPIMAT) 2.5 MCG/ACT AERS Inhale 2 puffs into the lungs daily.   Yes [provider]  traMADol (ULTRAM) 50 MG tablet Take 100 mg by mouth every 6 (six) hours as needed.   Yes [provider]  albuterol (PROVENTIL HFA;VENTOLIN HFA) 108 (90 Base) MCG/ACT inhaler Inhale 2 puffs into the lungs every 2 (two) hours as needed for wheezing or shortness of breath. 08/04/18   Reyne Dumas, MD  cephALEXin (KEFLEX) 500 MG capsule Take 1 capsule (500 mg total) by mouth 2 (two) times daily. Patient not taking: Reported on 05/07/2020 04/13/20   Orpah Greek, MD  doxycycline (VIBRAMYCIN) 100 MG capsule Take 1 capsule (100 mg total) by mouth 2 (two) times daily. 05/07/20   Orpah Greek, MD  hydroxypropyl methylcellulose / hypromellose (ISOPTO TEARS / GONIOVISC) 2.5 % ophthalmic solution Place 1 drop into both eyes 3 (three) times daily as needed for dry eyes.    [provider]  predniSONE (DELTASONE) 20 MG tablet Take 2 tablets (40 mg total) by mouth daily with breakfast. 05/07/20   Albert Devaul, Gwenyth Allegra, MD    Allergies    Patient has no known allergies.  Review of Systems   Review of Systems  Respiratory: Positive for cough, shortness of breath and wheezing.   All other systems reviewed and are negative.   Physical Exam Updated Vital Signs BP (!) 143/62 (BP Location: Right Arm)   Pulse 78   Temp 97.9 F (36.6 C) (Oral)   Resp 16   Ht 6\' 1"  (1.854 m)   Wt 73.5 kg   SpO2 100%   BMI 21.38 kg/m   Physical Exam Vitals and nursing note reviewed.  Constitutional:      General: He is not in acute distress.    Appearance: Normal appearance. He is well-developed.  HENT:     Head: Normocephalic and atraumatic.     Right Ear: Hearing normal.     Left Ear: Hearing normal.     Nose: Nose normal.  Eyes:     Conjunctiva/sclera: Conjunctivae normal.     Pupils: Pupils are equal, round, and  reactive to light.  Cardiovascular:     Rate and Rhythm: Regular rhythm.  Heart sounds: S1 normal and S2 normal. No murmur heard.  No friction rub. No gallop.   Pulmonary:     Effort: Tachypnea and accessory muscle usage present. No respiratory distress.     Breath sounds: Decreased breath sounds and wheezing present.  Chest:     Chest wall: No tenderness.  Abdominal:     General: Bowel sounds are normal.     Palpations: Abdomen is soft.     Tenderness: There is no abdominal tenderness. There is no guarding or rebound. Negative signs include Murphy's sign and McBurney's sign.     Hernia: No hernia is present.  Musculoskeletal:        General: Normal range of motion.     Cervical back: Normal range of motion and neck supple.  Skin:    General: Skin is warm and dry.     Findings: No rash.  Neurological:     Mental Status: He is alert and oriented to person, place, and time.     GCS: GCS eye subscore is 4. GCS verbal subscore is 5. GCS motor subscore is 6.     Cranial Nerves: No cranial nerve deficit.     Sensory: No sensory deficit.     Coordination: Coordination normal.  Psychiatric:        Speech: Speech normal.        Behavior: Behavior normal.        Thought Content: Thought content normal.     ED Results / Procedures / Treatments   Labs (all labs ordered are listed, but only abnormal results are displayed) Labs Reviewed  BASIC METABOLIC PANEL - Abnormal; Notable for the following components:      Result Value   Sodium 134 (*)    Glucose, Bld 118 (*)    Creatinine, Ser 1.68 (*)    GFR, Estimated 44 (*)    All other components within normal limits  RESPIRATORY PANEL BY RT PCR (FLU A&B, COVID)  CBC WITH DIFFERENTIAL/PLATELET  BRAIN NATRIURETIC PEPTIDE  TROPONIN I (HIGH SENSITIVITY)  TROPONIN I (HIGH SENSITIVITY)    EKG EKG Interpretation  Date/Time:  Saturday May 07 2020 00:01:27 EDT Ventricular Rate:  75 PR Interval:    QRS Duration: 89 QT  Interval:  398 QTC Calculation: 442 R Axis:   38 Text Interpretation: Sinus rhythm Abnormal T, consider ischemia, anterior leads Confirmed by Orpah Greek ([id number]) on 05/07/2020 12:03:47 AM   Radiology DG Chest Port 1 View  Result Date: 05/07/2020 CLINICAL DATA:  Shortness of breath EXAM: PORTABLE CHEST 1 VIEW COMPARISON:  09/24/2019 FINDINGS: Linear areas of scarring in the lungs bilaterally. Findings stable. Heart is normal size. No confluent opacities or effusions. No acute bony abnormality. IMPRESSION: Scarring/chronic changes.  No active disease. Electronically Signed   By: Rolm Baptise M.D.   On: 05/07/2020 00:55    Procedures Procedures (including critical care time)  Medications Ordered in ED Medications  ipratropium-albuterol (DUONEB) 0.5-2.5 (3) MG/3ML nebulizer solution 3 mL (3 mLs Nebulization Given 05/07/20 0507)  ondansetron (ZOFRAN) injection 4 mg (4 mg Intravenous Given 05/07/20 0515)    ED Course  I have reviewed the triage vital signs and the nursing notes.  Pertinent labs & imaging results that were available during my care of the patient were reviewed by me and considered in my medical decision making (see chart for details).    MDM Rules/Calculators/A&P  Patient presents to the emergency department for evaluation of shortness of breath.  Patient has a history of COPD.  Patient initiated on treatment by EMS with nebulizer treatment, Solu-Medrol.  He is feeling better with treatment.  Work-up does not reveal any evidence of pneumonia.  Covid negative.  No evidence of congestive heart failure.  He had persistent wheezing at arrival but this has cleared up with additional nebulizer treatments.  Oxygen saturations are 97% or greater on his normal supplemental oxygen.  He reports that his breathing is back to normal.  Will discharge with antibiotics, prednisone, follow-up as needed. Final Clinical Impression(s) / ED Diagnoses Final  diagnoses:  COPD exacerbation ([place])    Rx / DC Orders ED Discharge Orders         Ordered    doxycycline (VIBRAMYCIN) 100 MG capsule  2 times daily        05/07/20 0743    predniSONE (DELTASONE) 20 MG tablet  Daily with breakfast        05/07/20 0743           Orpah Greek, MD 05/07/20 [phone]

## 2020-05-12 DIAGNOSIS — N189 Chronic kidney disease, unspecified: Secondary | ICD-10-CM | POA: Diagnosis not present

## 2020-05-12 DIAGNOSIS — J449 Chronic obstructive pulmonary disease, unspecified: Secondary | ICD-10-CM | POA: Diagnosis not present

## 2020-05-24 ENCOUNTER — Telehealth: Payer: Self-pay

## 2020-05-24 ENCOUNTER — Other Ambulatory Visit: Payer: Self-pay | Admitting: Surgical

## 2020-05-24 MED ORDER — METHOCARBAMOL 500 MG PO TABS
500.0000 mg | ORAL_TABLET | Freq: Three times a day (TID) | ORAL | 0 refills | Status: DC | PRN
Start: 2020-05-24 — End: 2020-06-07

## 2020-05-24 MED ORDER — OXYCODONE-ACETAMINOPHEN 5-325 MG PO TABS
1.0000 | ORAL_TABLET | Freq: Two times a day (BID) | ORAL | 0 refills | Status: DC | PRN
Start: 2020-05-24 — End: 2020-06-07

## 2020-05-24 NOTE — Telephone Encounter (Signed)
Please advise. Thanks.  

## 2020-05-24 NOTE — Telephone Encounter (Signed)
submitted

## 2020-05-24 NOTE — Telephone Encounter (Signed)
Patient called he is requesting refill for oxycodone and methocarbamol. [place]

## 2020-05-30 ENCOUNTER — Other Ambulatory Visit: Payer: Self-pay

## 2020-05-30 ENCOUNTER — Encounter: Payer: Self-pay | Admitting: Physical Therapy

## 2020-05-30 ENCOUNTER — Ambulatory Visit (INDEPENDENT_AMBULATORY_CARE_PROVIDER_SITE_OTHER): Payer: No Typology Code available for payment source | Admitting: Physical Therapy

## 2020-05-30 DIAGNOSIS — R600 Localized edema: Secondary | ICD-10-CM

## 2020-05-30 DIAGNOSIS — M6281 Muscle weakness (generalized): Secondary | ICD-10-CM | POA: Diagnosis not present

## 2020-05-30 DIAGNOSIS — M25612 Stiffness of left shoulder, not elsewhere classified: Secondary | ICD-10-CM

## 2020-05-30 DIAGNOSIS — M25512 Pain in left shoulder: Secondary | ICD-10-CM | POA: Diagnosis not present

## 2020-05-30 NOTE — Therapy (Signed)
[facility] Physical Therapy [address] [place], [place], [address] Phone: [phone]   Fax:  [phone]  Physical Therapy Evaluation  Patient Details  Name: Gregory Christensen MRN: [id number] Date of Birth: March 12, 1951 Referring Provider (PT): Marcene Duos MD   Encounter Date: 05/30/2020   PT End of Session - 05/30/20 1142    Visit Number 1    Number of Visits 15    Date for PT Re-Evaluation 06/17/20    Authorization Type 15 visits till 06/18/2020    Authorization Time Period recommending 2x/ week for 8 weeks    Authorization - Visit Number 1    Authorization - Number of Visits 15    Progress Note Due on Visit 10    PT Start Time 0930    PT Stop Time 1012    PT Time Calculation (min) 42 min    Activity Tolerance Patient tolerated treatment well    Behavior During Therapy [facility] for tasks assessed/performed           Past Medical History:  Diagnosis Date  . Acute deep vein thrombosis (DVT) of left lower extremity ([place])   . Acute hypoxemic respiratory failure ([place])   . Anemia    Iron deficiency  . Anxiety   . Aortic atherosclerosis ([place])   . Arthritis   . Benign prostatic hyperplasia 08/28/2018   UNSPECIFIED WHETHER LOWER URINARY TRACT SYMPTOMS PRESENT  . CAD (coronary artery disease)   . Cancer ([place])   . Chronic back pain   . Chronic hip pain   . Constipation    due to Iron M-W-F  . COPD exacerbation ([place]) 10/18/2017  . Dyspnea   . Emphysema lung ([place])   . Essential hypertension   . GERD (gastroesophageal reflux disease)   . Gout   . Gross hematuria 09/08/2018  . Head injury, acute, with loss of consciousness ([place])    unsure how long he was unresponsive  . History of blood transfusion 05/2019  . History of kidney stones   . History of tobacco abuse   . Hypoxia   . Insomnia   . 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 Pneumonia    in 1980's  . Pre-diabetes   . Pulmonary embolism ([place]) 05/31/2018  . Renal mass    CONCERNING FOR RENAL CELL  CARCINOMA DR. Lovena Neighbours  . Respiratory distress   . Respiratory failure, acute-on-chronic ([place]) 08/03/2018  . Seasonal allergies   . Sleep apnea     Past Surgical History:  Procedure Laterality Date  . COLONOSCOPY WITH PROPOFOL N/A 09/27/2019   Procedure: COLONOSCOPY WITH PROPOFOL;  Surgeon: Irene Shipper, MD;  [facility];  Service: Endoscopy;  Laterality: N/A;  . CYSTOSCOPY/URETEROSCOPY/HOLMIUM LASER/STENT PLACEMENT Left 10/31/2018   Procedure: CYSTOSCOPY/URETEROSCOPY;  Surgeon: Ceasar Mons, MD;  [facility];  Service: Urology;  Laterality: Left;  . ESOPHAGOGASTRODUODENOSCOPY (EGD) WITH PROPOFOL N/A 09/27/2019   Procedure: ESOPHAGOGASTRODUODENOSCOPY (EGD) WITH PROPOFOL;  Surgeon: Irene Shipper, MD;  [facility];  Service: Endoscopy;  Laterality: N/A;  . HERNIA REPAIR Left 1977   inguinal  . HYDROCELE EXCISION Left 05/13/2019   Procedure: HYDROCELECTOMY ADULT;  Surgeon: Ceasar Mons, MD;  [facility];  Service: Urology;  Laterality: Left;  Marland Kitchen MANDIBLE SURGERY  1976   EXTENSIVE  . POLYPECTOMY  09/27/2019   Procedure: POLYPECTOMY;  Surgeon: Irene Shipper, MD;  [facility];  Service: Endoscopy;;  . ROBOT ASSITED LAPAROSCOPIC NEPHROURETERECTOMY Left 10/31/2018   Procedure: XI ROBOT ASSITED  LAPAROSCOPIC NEPHROURETERECTOMY;  Surgeon: Ceasar Mons, MD;  [facility];  Service: Urology;  Laterality: Left;  ONLY NEEDS 240 MIN FOR ALL PROCEDURES  . SHOULDER ARTHROSCOPY WITH ROTATOR CUFF REPAIR Left 04/12/2020   Procedure: LEFT SHOULDER ARTHROSCOPY, DEBRIDEMENT, BICEPS TENODESIS, MINI OPEN ROTATOR CUFF TEAR REPAIR;  Surgeon: Meredith Pel, MD;  Location: [facility];  Service: Orthopedics;  Laterality: Left;    There were no vitals filed for this visit.    Subjective Assessment - 05/30/20 0936    Subjective Pt arriving to therapy reporting L shoulder pain  after undergoing L shoulder surgery on 04/12/2020. Pt reporting  pain is waking him up at night around 3am. Pt reporting 7/10 pain at present after taking pain meds and muscle relaxor.    Pertinent History Pt on 2L oxygen via nasal cannula,    Diagnostic tests x-ray    Patient Stated Goals Stop hurting, be able to sleep    Currently in Pain? Yes    Pain Score 7     Pain Orientation Left    Pain Descriptors / Indicators Aching    Pain Type Acute pain;Surgical pain    Pain Radiating Towards toward left elbow    Pain Onset More than a month ago    Pain Frequency Intermittent    Aggravating Factors  sleeping on his side    Pain Relieving Factors using heating pad, biofreeze, pain meds    Effect of Pain on Daily Activities sleeping is affected              [facility] PT Assessment - 05/30/20 0001      Assessment   Medical Diagnosis L rotator cuff repair on 04/12/2020,     Referring Provider (PT) Marcene Duos MD    Onset Date/Surgical Date 04/12/20    Hand Dominance Right    Prior Therapy yes, [facility] legs/ knees [facility]      Precautions   Precautions Shoulder    Type of Shoulder Precautions rotator cuff surgery on 04/12/2020      Balance Screen   Has the patient fallen in the past 6 months No      [place] residence    Living Arrangements Parent   lives with 69 year old mother   Type of [place] to enter    Entrance Stairs-Number of Steps 2    Additional Comments O2 tank      Prior Function   Level of Independence Independent    Leisure watch tv      Cognition   Overall Cognitive Status Within Functional Limits for tasks assessed      Observation/Other Assessments   Focus on Therapeutic Outcomes (FOTO)  41% limitation      Posture/Postural Control   Posture/Postural Control Postural limitations    Postural Limitations Rounded Shoulders;Forward head;Decreased lumbar lordosis      ROM / Strength   AROM / PROM / Strength AROM;Strength      AROM   Overall AROM  Deficits     AROM Assessment Site Shoulder    Right/Left Shoulder Right;Left    Right Shoulder Extension 70 Degrees    Right Shoulder Flexion 155 Degrees    Right Shoulder ABduction 125 Degrees    Right Shoulder Internal Rotation --   thumb to T10   Right Shoulder External Rotation 75 Degrees    Left Shoulder Extension 50 Degrees    Left Shoulder Flexion 120  Degrees    Left Shoulder ABduction 85 Degrees    Left Shoulder Internal Rotation --   thumb to L2   Left Shoulder External Rotation 48 Degrees      Strength   Overall Strength Comments R grip 90ppsi, L grip: 71ppsi     Strength Assessment Site Shoulder    Right/Left Shoulder Right;Left    Right Shoulder Flexion 5/5    Right Shoulder Extension 4/5    Right Shoulder Internal Rotation 4/5    Right Shoulder External Rotation 4/5    Left Shoulder Flexion 3/5    Left Shoulder Extension 3/5    Left Shoulder Internal Rotation 3/5    Left Shoulder External Rotation 3/5      Palpation   Palpation comment TTP: anterior shoulder       Transfers   Five time sit to stand comments  25 seconds with UE support       Ambulation/Gait   Gait Comments forward flexed posture                      Objective measurements completed on examination: See above findings.               PT Education - 05/30/20 1053    Education Details PT POC, HEP    Person(s) Educated Patient    Methods Explanation;Demonstration;Handout    Comprehension Verbalized understanding;Returned demonstration            PT Short Term Goals - 05/30/20 1054      PT SHORT TERM GOAL #1   Title Pt will be independent in his initial HEP.    Time 3    Period Weeks    Status New    Target Date 06/17/20      PT SHORT TERM GOAL #2   Title Pt will be able to improve his Left Shoulder flexion to >/= 140 degrees with pain </= 3/10.    Baseline 120 degrees pain 7/10 L shoulder flexion    Time 3    Period Weeks    Status New    Target Date 06/17/20              PT Long Term Goals - 05/30/20 1101      PT LONG TERM GOAL #1   Title Pt will improve his FOTO score from 41% limitation to </= 31% limitation.    Time 8    Period Weeks    Status New    Target Date 07/25/20      PT LONG TERM GOAL #2   Title Pt will be able to improve his Left shoulder flexion to >/= 160 degrees wtih pain </= 3/10.    Baseline 120 degrees on 05/30/2020 with pain 7/10    Time 8    Period Weeks    Status New      PT LONG TERM GOAL #3   Title Pt will improve his L shoulder ER to >/= 70 degrees to improve functional mobility.    Baseline 48 degrees on 05/30/2020    Time 8    Period Weeks    Status New      PT LONG TERM GOAL #4   Title Pt will be able to report better sleep improvement of 50%.    Baseline waking up every night around 3am    Time 8    Status New  Plan - 05/30/20 1131    Clinical Impression Statement Pt arriving s/p L rotator cuff repair on 04/12/2020. Pt reporting 8/10 pain. Pt presenting with weakness and decreasd ROM in L shoulder compared to R. Pt arriving on 2L O2 nasal cannual, pt's 02 saturations were from 98-99%. Pt presenting with forward flexed posture when amb pt with noted stiffness in paraspinals. Skilled PT needed to address pt's impariments using the below interventions.    Personal Factors and Comorbidities Comorbidity 3+    Comorbidities anxiety, GERD, arthritis, COPD, CAD, acute respiratory failure, gout, sleep apnea, head injury with loss of conscious    Examination-Activity Limitations Lift;Dressing;Reach Overhead;Carry    Examination-Participation Restrictions Community Activity;Other    Stability/Clinical Decision Making Stable/Uncomplicated    Clinical Decision Making Low    Rehab Potential Good    PT Frequency 2x / week    PT Duration 8 weeks    PT Treatment/Interventions ADLs/Self Care Home Management;Cryotherapy;Electrical Stimulation;Ultrasound;Moist Heat;Functional mobility training;Stair  training;Therapeutic activities;Therapeutic exercise;Balance training;Neuromuscular re-education;Patient/family education;Passive range of motion;Manual techniques;Taping    PT Next Visit Plan rotator cuff repair protocol, Shoulder ROM    PT Home Exercise Plan [email or web address]    Consulted and Agree with Plan of Care Patient           Patient will benefit from skilled therapeutic intervention in order to improve the following deficits and impairments:  Pain, Postural dysfunction, Decreased strength, Increased edema, Impaired flexibility, Decreased range of motion  Visit Diagnosis: Acute pain of left shoulder  Stiffness of left shoulder, not elsewhere classified  Muscle weakness (generalized)  Localized edema     Problem List Patient Active Problem List   Diagnosis Date Noted  . Benign neoplasm of transverse colon   . Benign neoplasm of descending colon   . Benign neoplasm of rectum   . Heme positive stool   . Iron deficiency anemia due to chronic blood loss   . Melena   . Abdominal pain, chronic, right lower quadrant   . Chronic anticoagulation   . Symptomatic anemia 09/24/2019  . Pneumonia due to severe acute respiratory syndrome coronavirus 2 (SARS-CoV-2) 02/28/2019  . Renal mass 10/31/2018  . Chest pressure 10/15/2018  . Coronary artery calcification 10/15/2018  . Respiratory failure, acute-on-chronic ([place]) 08/03/2018  . Acute deep vein thrombosis (DVT) of left lower extremity ([place])   . Acute hypoxemic respiratory failure ([place])   . Pulmonary embolism ([place]) 05/31/2018  . COPD exacerbation ([place]) 10/18/2017  . Hypoxia   . Respiratory distress   . Essential hypertension     Oretha Caprice, PT, MPT 05/30/2020, 11:44 AM  [facility] Physical Therapy [address] [place], [place], [address] Phone: [phone]   Fax:  [phone]  Name: Jarron Curley MRN: [id number] Date of Birth: 08/18/1950

## 2020-05-30 NOTE — Patient Instructions (Signed)
Access Code: [email or web address] URL: [email or web address] Date: 05/30/2020 Prepared by: Kearney Hard  Exercises Seated Bilateral Shoulder Flexion Towel Slide at Table Top - 3 x daily - 7 x weekly - 10 reps - 5 seconds hold Seated Shoulder External Rotation PROM on Table - 3 x daily - 7 x weekly - 10 reps - 5 seconds hold Supine Shoulder Flexion Extension AAROM with Dowel - 3 x daily - 7 x weekly - 10 reps - 3 seconds hold

## 2020-06-04 DIAGNOSIS — J441 Chronic obstructive pulmonary disease with (acute) exacerbation: Secondary | ICD-10-CM | POA: Diagnosis not present

## 2020-06-07 ENCOUNTER — Other Ambulatory Visit: Payer: Self-pay | Admitting: Surgical

## 2020-06-07 ENCOUNTER — Telehealth: Payer: Self-pay | Admitting: Specialist

## 2020-06-07 MED ORDER — HYDROCODONE-ACETAMINOPHEN 5-325 MG PO TABS: 1.0000 | ORAL_TABLET | Freq: Every day | ORAL | 0 refills | Status: DC | PRN

## 2020-06-07 MED ORDER — METHOCARBAMOL 500 MG PO TABS
500.0000 mg | ORAL_TABLET | Freq: Three times a day (TID) | ORAL | 0 refills | Status: DC | PRN
Start: 2020-06-07 — End: 2021-03-21

## 2020-06-07 NOTE — Telephone Encounter (Signed)
Patient called requesting a refill of oxycodone and muscle relaxer medications. Please send to pharmacy on file. Patient phone number is [phone].

## 2020-06-07 NOTE — Telephone Encounter (Signed)
Submitted refill but changed oxy to norco and lowered the frequency. Should be close to discontinuing narcotics soon

## 2020-06-07 NOTE — Telephone Encounter (Signed)
I left voicemail for patient advising. 

## 2020-06-07 NOTE — Telephone Encounter (Signed)
Please advise 

## 2020-06-07 NOTE — Telephone Encounter (Signed)
Looks like Drake rx'd these for him.

## 2020-06-08 ENCOUNTER — Other Ambulatory Visit: Payer: Self-pay

## 2020-06-08 ENCOUNTER — Ambulatory Visit (INDEPENDENT_AMBULATORY_CARE_PROVIDER_SITE_OTHER): Payer: No Typology Code available for payment source | Admitting: Physical Therapy

## 2020-06-08 ENCOUNTER — Encounter: Payer: Self-pay | Admitting: Physical Therapy

## 2020-06-08 DIAGNOSIS — M6281 Muscle weakness (generalized): Secondary | ICD-10-CM | POA: Diagnosis not present

## 2020-06-08 DIAGNOSIS — N183 Chronic kidney disease, stage 3 unspecified: Secondary | ICD-10-CM | POA: Diagnosis not present

## 2020-06-08 DIAGNOSIS — I2510 Atherosclerotic heart disease of native coronary artery without angina pectoris: Secondary | ICD-10-CM | POA: Diagnosis not present

## 2020-06-08 DIAGNOSIS — M25612 Stiffness of left shoulder, not elsewhere classified: Secondary | ICD-10-CM

## 2020-06-08 DIAGNOSIS — M25512 Pain in left shoulder: Secondary | ICD-10-CM | POA: Diagnosis not present

## 2020-06-08 DIAGNOSIS — M1990 Unspecified osteoarthritis, unspecified site: Secondary | ICD-10-CM | POA: Diagnosis not present

## 2020-06-08 DIAGNOSIS — R600 Localized edema: Secondary | ICD-10-CM | POA: Diagnosis not present

## 2020-06-08 DIAGNOSIS — J441 Chronic obstructive pulmonary disease with (acute) exacerbation: Secondary | ICD-10-CM | POA: Diagnosis not present

## 2020-06-08 DIAGNOSIS — J449 Chronic obstructive pulmonary disease, unspecified: Secondary | ICD-10-CM | POA: Diagnosis not present

## 2020-06-08 DIAGNOSIS — K219 Gastro-esophageal reflux disease without esophagitis: Secondary | ICD-10-CM | POA: Diagnosis not present

## 2020-06-08 DIAGNOSIS — N400 Enlarged prostate without lower urinary tract symptoms: Secondary | ICD-10-CM | POA: Diagnosis not present

## 2020-06-08 DIAGNOSIS — E7800 Pure hypercholesterolemia, unspecified: Secondary | ICD-10-CM | POA: Diagnosis not present

## 2020-06-08 DIAGNOSIS — I10 Essential (primary) hypertension: Secondary | ICD-10-CM | POA: Diagnosis not present

## 2020-06-08 NOTE — Therapy (Signed)
[facility] Physical Therapy [address] [place], [place], [address] Phone: [phone]   Fax:  [phone]  Physical Therapy Treatment  Patient Details  Name: Gregory Christensen MRN: [id number] Date of Birth: 1950/10/26 Referring Provider (PT): Marcene Duos MD   Encounter Date: 06/08/2020   PT End of Session - 06/08/20 0945    Visit Number 2    Number of Visits 15    Date for PT Re-Evaluation 06/17/20    Authorization Type 15 visits till 06/18/2020    Authorization Time Period recommending 2x/ week for 8 weeks    Authorization - Visit Number 2    Authorization - Number of Visits 15    Progress Note Due on Visit 10    PT Start Time 0930    PT Stop Time 1000    PT Time Calculation (min) 30 min    Activity Tolerance Patient tolerated treatment well    Behavior During Therapy [facility] for tasks assessed/performed           Past Medical History:  Diagnosis Date  . Acute deep vein thrombosis (DVT) of left lower extremity ([place])   . Acute hypoxemic respiratory failure ([place])   . Anemia    Iron deficiency  . Anxiety   . Aortic atherosclerosis ([place])   . Arthritis   . Benign prostatic hyperplasia 08/28/2018   UNSPECIFIED WHETHER LOWER URINARY TRACT SYMPTOMS PRESENT  . CAD (coronary artery disease)   . Cancer ([place])   . Chronic back pain   . Chronic hip pain   . Constipation    due to Iron M-W-F  . COPD exacerbation ([place]) 10/18/2017  . Dyspnea   . Emphysema lung ([place])   . Essential hypertension   . GERD (gastroesophageal reflux disease)   . Gout   . Gross hematuria 09/08/2018  . Head injury, acute, with loss of consciousness ([place])    unsure how long he was unresponsive  . History of blood transfusion 05/2019  . History of kidney stones   . History of tobacco abuse   . Hypoxia   . Insomnia   . 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 Pneumonia    in 1980's  . Pre-diabetes   . Pulmonary embolism ([place]) 05/31/2018  . Renal mass    CONCERNING FOR RENAL CELL  CARCINOMA DR. Lovena Neighbours  . Respiratory distress   . Respiratory failure, acute-on-chronic ([place]) 08/03/2018  . Seasonal allergies   . Sleep apnea     Past Surgical History:  Procedure Laterality Date  . COLONOSCOPY WITH PROPOFOL N/A 09/27/2019   Procedure: COLONOSCOPY WITH PROPOFOL;  Surgeon: Irene Shipper, MD;  [facility];  Service: Endoscopy;  Laterality: N/A;  . CYSTOSCOPY/URETEROSCOPY/HOLMIUM LASER/STENT PLACEMENT Left 10/31/2018   Procedure: CYSTOSCOPY/URETEROSCOPY;  Surgeon: Ceasar Mons, MD;  [facility];  Service: Urology;  Laterality: Left;  . ESOPHAGOGASTRODUODENOSCOPY (EGD) WITH PROPOFOL N/A 09/27/2019   Procedure: ESOPHAGOGASTRODUODENOSCOPY (EGD) WITH PROPOFOL;  Surgeon: Irene Shipper, MD;  [facility];  Service: Endoscopy;  Laterality: N/A;  . HERNIA REPAIR Left 1977   inguinal  . HYDROCELE EXCISION Left 05/13/2019   Procedure: HYDROCELECTOMY ADULT;  Surgeon: Ceasar Mons, MD;  [facility];  Service: Urology;  Laterality: Left;  Marland Kitchen MANDIBLE SURGERY  1976   EXTENSIVE  . POLYPECTOMY  09/27/2019   Procedure: POLYPECTOMY;  Surgeon: Irene Shipper, MD;  [facility];  Service: Endoscopy;;  . ROBOT ASSITED LAPAROSCOPIC NEPHROURETERECTOMY Left 10/31/2018   Procedure: XI ROBOT ASSITED  LAPAROSCOPIC NEPHROURETERECTOMY;  Surgeon: Ceasar Mons, MD;  [facility];  Service: Urology;  Laterality: Left;  ONLY NEEDS 240 MIN FOR ALL PROCEDURES  . SHOULDER ARTHROSCOPY WITH ROTATOR CUFF REPAIR Left 04/12/2020   Procedure: LEFT SHOULDER ARTHROSCOPY, DEBRIDEMENT, BICEPS TENODESIS, MINI OPEN ROTATOR CUFF TEAR REPAIR;  Surgeon: Meredith Pel, MD;  Location: [facility];  Service: Orthopedics;  Laterality: Left;    There were no vitals filed for this visit.   Subjective Assessment - 06/08/20 0944    Subjective Pt arriving to therapy reporting 5/10 pain in L shoulder. Pt reporting compliance with his HEP.    Pertinent History  Pt on 2L oxygen via nasal cannula,    Diagnostic tests x-ray    Patient Stated Goals Stop hurting, be able to sleep    Currently in Pain? Yes    Pain Score 5     Pain Location Shoulder    Pain Orientation Left    Pain Descriptors / Indicators Aching;Sore    Pain Type Acute pain    Pain Onset More than a month ago                             [facility] Adult PT Treatment/Exercise - 06/08/20 0001      Exercises   Exercises Shoulder      Shoulder Exercises: Supine   External Rotation AAROM;Left    Flexion AAROM;20 reps;Limitations    Other Supine Exercises cervical retraction x 10 holding 5 seconds,      Shoulder Exercises: Seated   Other Seated Exercises table slides: flexion and scaption 2 x 10 each    Other Seated Exercises UE ranger: circles both directions x 30, scapular retraction x 5 hodling 5 seconds each      Shoulder Exercises: Pulleys   Flexion 3 minutes    Scaption 2 minutes      Modalities   Modalities Cryotherapy      Cryotherapy   Number Minutes Cryotherapy 2 Minutes    Cryotherapy Location Shoulder    Type of Cryotherapy Ice pack      Manual Therapy   Manual Therapy Passive ROM    Passive ROM L shoulder: flexion/ ER, grade                   PT Education - 06/08/20 0945    Education Details Exercises techniques    Person(s) Educated Patient    Methods Explanation;Demonstration    Comprehension Verbalized understanding;Returned demonstration            PT Short Term Goals - 06/08/20 0946      PT SHORT TERM GOAL #1   Title Pt will be independent in his initial HEP.    Status On-going      PT SHORT TERM GOAL #2   Title Pt will be able to improve his Left Shoulder flexion to >/= 140 degrees with pain </= 3/10.    Status On-going             PT Long Term Goals - 05/30/20 1101      PT LONG TERM GOAL #1   Title Pt will improve his FOTO score from 41% limitation to </= 31% limitation.    Time 8    Period Weeks     Status New    Target Date 07/25/20      PT LONG TERM GOAL #2   Title Pt will be able to improve  his Left shoulder flexion to >/= 160 degrees wtih pain </= 3/10.    Baseline 120 degrees on 05/30/2020 with pain 7/10    Time 8    Period Weeks    Status New      PT LONG TERM GOAL #3   Title Pt will improve his L shoulder ER to >/= 70 degrees to improve functional mobility.    Baseline 48 degrees on 05/30/2020    Time 8    Period Weeks    Status New      PT LONG TERM GOAL #4   Title Pt will be able to report better sleep improvement of 50%.    Baseline waking up every night around 3am    Time 8    Status New                 Plan - 06/08/20 0947    Clinical Impression Statement Pt tolerating execises with mild increase in pain reported with PROM flexion and ER. Continue to progress with ROM. Pt reporting he had another appointment and had to leave after 30 minute session.    Personal Factors and Comorbidities Comorbidity 3+    Comorbidities anxiety, GERD, arthritis, COPD, CAD, acute respiratory failure, gout, sleep apnea, head injury with loss of conscious    Examination-Activity Limitations Lift;Dressing;Reach Overhead;Carry    Examination-Participation Restrictions Community Activity;Other    Stability/Clinical Decision Making Stable/Uncomplicated    PT Frequency 2x / week    PT Duration 8 weeks    PT Treatment/Interventions ADLs/Self Care Home Management;Cryotherapy;Electrical Stimulation;Ultrasound;Moist Heat;Functional mobility training;Stair training;Therapeutic activities;Therapeutic exercise;Balance training;Neuromuscular re-education;Patient/family education;Passive range of motion;Manual techniques;Taping    PT Next Visit Plan rotator cuff repair protocol, Shoulder ROM    PT Home Exercise Plan [email or web address]    Consulted and Agree with Plan of Care Patient           Patient will benefit from skilled therapeutic intervention in order to improve the following deficits  and impairments:  Pain, Postural dysfunction, Decreased strength, Increased edema, Impaired flexibility, Decreased range of motion  Visit Diagnosis: Acute pain of left shoulder  Stiffness of left shoulder, not elsewhere classified  Muscle weakness (generalized)  Localized edema     Problem List Patient Active Problem List   Diagnosis Date Noted  . Benign neoplasm of transverse colon   . Benign neoplasm of descending colon   . Benign neoplasm of rectum   . Heme positive stool   . Iron deficiency anemia due to chronic blood loss   . Melena   . Abdominal pain, chronic, right lower quadrant   . Chronic anticoagulation   . Symptomatic anemia 09/24/2019  . Pneumonia due to severe acute respiratory syndrome coronavirus 2 (SARS-CoV-2) 02/28/2019  . Renal mass 10/31/2018  . Chest pressure 10/15/2018  . Coronary artery calcification 10/15/2018  . Respiratory failure, acute-on-chronic ([place]) 08/03/2018  . Acute deep vein thrombosis (DVT) of left lower extremity ([place])   . Acute hypoxemic respiratory failure ([place])   . Pulmonary embolism ([place]) 05/31/2018  . COPD exacerbation ([place]) 10/18/2017  . Hypoxia   . Respiratory distress   . Essential hypertension     Oretha Caprice, PT, MPT 06/08/2020, 10:04 AM  [facility] Physical Therapy [address] [place], [place], [address] Phone: [phone]   Fax:  [phone]  Name: Gregory Christensen MRN: [id number] Date of Birth: 1950/07/12

## 2020-06-14 ENCOUNTER — Ambulatory Visit (INDEPENDENT_AMBULATORY_CARE_PROVIDER_SITE_OTHER): Payer: No Typology Code available for payment source | Admitting: Physical Therapy

## 2020-06-14 ENCOUNTER — Other Ambulatory Visit: Payer: Self-pay

## 2020-06-14 DIAGNOSIS — M25612 Stiffness of left shoulder, not elsewhere classified: Secondary | ICD-10-CM

## 2020-06-14 DIAGNOSIS — M6281 Muscle weakness (generalized): Secondary | ICD-10-CM | POA: Diagnosis not present

## 2020-06-14 DIAGNOSIS — R600 Localized edema: Secondary | ICD-10-CM | POA: Diagnosis not present

## 2020-06-14 DIAGNOSIS — M25512 Pain in left shoulder: Secondary | ICD-10-CM | POA: Diagnosis not present

## 2020-06-14 NOTE — Therapy (Signed)
[facility] Physical Therapy [address] [place], [place], [address] Phone: [phone]   Fax:  [phone]  Physical Therapy Treatment  Patient Details  Name: Gregory Christensen MRN: [id number] Date of Birth: 04/06/1951 Referring Provider (PT): Marcene Duos MD   Encounter Date: 06/14/2020   PT End of Session - 06/14/20 1027    Visit Number 3    Number of Visits 15    Date for PT Re-Evaluation 06/17/20    Authorization Type 15 visits till 06/18/2020    Authorization Time Period recommending 2x/ week for 8 weeks    Authorization - Visit Number 3    Authorization - Number of Visits 15    Progress Note Due on Visit 10    PT Start Time 1020    PT Stop Time 1058    PT Time Calculation (min) 38 min    Activity Tolerance Patient tolerated treatment well    Behavior During Therapy [facility] for tasks assessed/performed           Past Medical History:  Diagnosis Date  . Acute deep vein thrombosis (DVT) of left lower extremity ([place])   . Acute hypoxemic respiratory failure ([place])   . Anemia    Iron deficiency  . Anxiety   . Aortic atherosclerosis ([place])   . Arthritis   . Benign prostatic hyperplasia 08/28/2018   UNSPECIFIED WHETHER LOWER URINARY TRACT SYMPTOMS PRESENT  . CAD (coronary artery disease)   . Cancer ([place])   . Chronic back pain   . Chronic hip pain   . Constipation    due to Iron M-W-F  . COPD exacerbation ([place]) 10/18/2017  . Dyspnea   . Emphysema lung ([place])   . Essential hypertension   . GERD (gastroesophageal reflux disease)   . Gout   . Gross hematuria 09/08/2018  . Head injury, acute, with loss of consciousness ([place])    unsure how long he was unresponsive  . History of blood transfusion 05/2019  . History of kidney stones   . History of tobacco abuse   . Hypoxia   . Insomnia   . 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 Pneumonia    in 1980's  . Pre-diabetes   . Pulmonary embolism ([place]) 05/31/2018  . Renal mass    CONCERNING FOR RENAL CELL  CARCINOMA DR. Lovena Neighbours  . Respiratory distress   . Respiratory failure, acute-on-chronic ([place]) 08/03/2018  . Seasonal allergies   . Sleep apnea     Past Surgical History:  Procedure Laterality Date  . COLONOSCOPY WITH PROPOFOL N/A 09/27/2019   Procedure: COLONOSCOPY WITH PROPOFOL;  Surgeon: Irene Shipper, MD;  [facility]: [facility];  Service: Endoscopy;  Laterality: N/A;  . CYSTOSCOPY/URETEROSCOPY/HOLMIUM LASER/STENT PLACEMENT Left 10/31/2018   Procedure: CYSTOSCOPY/URETEROSCOPY;  Surgeon: Ceasar Mons, MD;  [facility]: [facility];  Service: Urology;  Laterality: Left;  . ESOPHAGOGASTRODUODENOSCOPY (EGD) WITH PROPOFOL N/A 09/27/2019   Procedure: ESOPHAGOGASTRODUODENOSCOPY (EGD) WITH PROPOFOL;  Surgeon: Irene Shipper, MD;  [facility];  Service: Endoscopy;  Laterality: N/A;  . HERNIA REPAIR Left 1977   inguinal  . HYDROCELE EXCISION Left 05/13/2019   Procedure: HYDROCELECTOMY ADULT;  Surgeon: Ceasar Mons, MD;  [facility];  Service: Urology;  Laterality: Left;  Marland Kitchen MANDIBLE SURGERY  1976   EXTENSIVE  . POLYPECTOMY  09/27/2019   Procedure: POLYPECTOMY;  Surgeon: Irene Shipper, MD;  [facility];  Service: Endoscopy;;  . ROBOT ASSITED LAPAROSCOPIC NEPHROURETERECTOMY Left 10/31/2018   Procedure: XI ROBOT ASSITED  LAPAROSCOPIC NEPHROURETERECTOMY;  Surgeon: Ceasar Mons, MD;  [facility];  Service: Urology;  Laterality: Left;  ONLY NEEDS 240 MIN FOR ALL PROCEDURES  . SHOULDER ARTHROSCOPY WITH ROTATOR CUFF REPAIR Left 04/12/2020   Procedure: LEFT SHOULDER ARTHROSCOPY, DEBRIDEMENT, BICEPS TENODESIS, MINI OPEN ROTATOR CUFF TEAR REPAIR;  Surgeon: Meredith Pel, MD;  Location: [place] [facility];  Service: Orthopedics;  Laterality: Left;    There were no vitals filed for this visit.   Subjective Assessment - 06/14/20 1024    Subjective Pt arriving to therapy reporting 3-4/10 pain in L shoulder. Pt with increased SOB when walking into gym. O2 sats 97%  on 2 L Nasal cannula. Pt's HR was 110 bpm. Pt allowed sitting rest break to recover SOB.    Pertinent History Pt on 2L oxygen via nasal cannula,    Diagnostic tests x-ray    Patient Stated Goals Stop hurting, be able to sleep    Currently in Pain? Yes    Pain Score 4     Pain Location Shoulder    Pain Orientation Left    Pain Descriptors / Indicators Sore    Pain Type Acute pain    Pain Onset More than a month ago                             [facility] Adult PT Treatment/Exercise - 06/14/20 0001      Exercises   Exercises Shoulder      Shoulder Exercises: Supine   External Rotation AAROM;Left    Flexion AAROM;20 reps;Limitations    Other Supine Exercises cervical retraction x 10 holding 5 seconds,      Shoulder Exercises: Seated   Other Seated Exercises table slides: flexion and scaption 2 x 10 each    Other Seated Exercises UE ranger: circles both directions x 30, scapular retraction x 5 hodling 5 seconds each      Shoulder Exercises: Pulleys   Flexion 3 minutes    Scaption 2 minutes      Shoulder Exercises: Isometric Strengthening   Flexion 5X5"    Extension 5X5"    External Rotation 5X5"    Internal Rotation 5X5"      Modalities   Modalities Cryotherapy      Cryotherapy   Number Minutes Cryotherapy 5 Minutes    Cryotherapy Location Shoulder    Type of Cryotherapy Ice pack      Manual Therapy   Manual Therapy Passive ROM    Manual therapy comments grade 2-3 shoulder mobs    Passive ROM L shoulder: flexion/ ER, grade                     PT Short Term Goals - 06/14/20 1014      PT SHORT TERM GOAL #1   Title Pt will be independent in his initial HEP.    Status On-going      PT SHORT TERM GOAL #2   Title Pt will be able to improve his Left Shoulder flexion to >/= 140 degrees with pain </= 3/10.    Status On-going             PT Long Term Goals - 05/30/20 1101      PT LONG TERM GOAL #1   Title Pt will improve his FOTO score  from 41% limitation to </= 31% limitation.    Time 8    Period Weeks    Status New  Target Date 07/25/20      PT LONG TERM GOAL #2   Title Pt will be able to improve his Left shoulder flexion to >/= 160 degrees wtih pain </= 3/10.    Baseline 120 degrees on 05/30/2020 with pain 7/10    Time 8    Period Weeks    Status New      PT LONG TERM GOAL #3   Title Pt will improve his L shoulder ER to >/= 70 degrees to improve functional mobility.    Baseline 48 degrees on 05/30/2020    Time 8    Period Weeks    Status New      PT LONG TERM GOAL #4   Title Pt will be able to report better sleep improvement of 50%.    Baseline waking up every night around 3am    Time 8    Status New                 Plan - 06/14/20 1028    Clinical Impression Statement Pt requiring rest breaks between each execise to catch his breath. O2 saturation was monitored throughout session with sats ranging from 97-98% on 2 Liters of oxygen. Pt's HR ranging from 102 to 118 bpm. Pt still with limitations at end range. Continue skilled PT.    Personal Factors and Comorbidities Comorbidity 3+    Comorbidities anxiety, GERD, arthritis, COPD, CAD, acute respiratory failure, gout, sleep apnea, head injury with loss of conscious    Examination-Activity Limitations Lift;Dressing;Reach Overhead;Carry    Examination-Participation Restrictions Community Activity;Other    Stability/Clinical Decision Making Stable/Uncomplicated    Rehab Potential Good    PT Frequency 2x / week    PT Duration 8 weeks    PT Treatment/Interventions ADLs/Self Care Home Management;Cryotherapy;Electrical Stimulation;Ultrasound;Moist Heat;Functional mobility training;Stair training;Therapeutic activities;Therapeutic exercise;Balance training;Neuromuscular re-education;Patient/family education;Passive range of motion;Manual techniques;Taping    PT Next Visit Plan rotator cuff repair protocol, Shoulder ROM    PT Home Exercise Plan [id number]     Consulted and Agree with Plan of Care Patient           Patient will benefit from skilled therapeutic intervention in order to improve the following deficits and impairments:  Pain,Postural dysfunction,Decreased strength,Increased edema,Impaired flexibility,Decreased range of motion  Visit Diagnosis: Acute pain of left shoulder  Stiffness of left shoulder, not elsewhere classified  Muscle weakness (generalized)  Localized edema     Problem List Patient Active Problem List   Diagnosis Date Noted  . Benign neoplasm of transverse colon   . Benign neoplasm of descending colon   . Benign neoplasm of rectum   . Heme positive stool   . Iron deficiency anemia due to chronic blood loss   . Melena   . Abdominal pain, chronic, right lower quadrant   . Chronic anticoagulation   . Symptomatic anemia 09/24/2019  . Pneumonia due to severe acute respiratory syndrome coronavirus 2 (SARS-CoV-2) 02/28/2019  . Renal mass 10/31/2018  . Chest pressure 10/15/2018  . Coronary artery calcification 10/15/2018  . Respiratory failure, acute-on-chronic ([place]) 08/03/2018  . Acute deep vein thrombosis (DVT) of left lower extremity ([place])   . Acute hypoxemic respiratory failure ([place])   . Pulmonary embolism ([place]) 05/31/2018  . COPD exacerbation ([place]) 10/18/2017  . Hypoxia   . Respiratory distress   . Essential hypertension     Oretha Caprice, PT, MPT 06/14/2020, 10:53 AM  [facility] Physical Therapy [address] [place], [place], [address] Phone: [phone]  Fax:  [phone]  Name: Gregory Christensen MRN: [id number] Date of Birth: [birth year]

## 2020-06-17 ENCOUNTER — Ambulatory Visit (INDEPENDENT_AMBULATORY_CARE_PROVIDER_SITE_OTHER): Payer: No Typology Code available for payment source | Admitting: Physical Therapy

## 2020-06-17 ENCOUNTER — Other Ambulatory Visit: Payer: Self-pay

## 2020-06-17 DIAGNOSIS — M25612 Stiffness of left shoulder, not elsewhere classified: Secondary | ICD-10-CM

## 2020-06-17 DIAGNOSIS — M6281 Muscle weakness (generalized): Secondary | ICD-10-CM | POA: Diagnosis not present

## 2020-06-17 DIAGNOSIS — R600 Localized edema: Secondary | ICD-10-CM

## 2020-06-17 DIAGNOSIS — M25512 Pain in left shoulder: Secondary | ICD-10-CM | POA: Diagnosis not present

## 2020-06-17 NOTE — Therapy (Signed)
[facility] Physical Therapy [address] [place], [place], [address] Phone: [phone]   Fax:  [phone]  Physical Therapy Treatment  Patient Details  Name: Gregory Christensen MRN: [id number] Date of Birth: 1950/09/11 Referring Provider (PT): Marcene Duos MD   Encounter Date: 06/17/2020   PT End of Session - 06/17/20 1054    Visit Number 4    Number of Visits 15    Date for PT Re-Evaluation 06/17/20    Authorization Type 15 visits till 06/18/2020    Authorization Time Period recommending 2x/ week for 8 weeks    Authorization - Visit Number 4    Authorization - Number of Visits 15    Progress Note Due on Visit 10    PT Start Time 2751    PT Stop Time 1103   last 10 min on ice   PT Time Calculation (min) 48 min    Activity Tolerance Patient tolerated treatment well;Patient limited by fatigue    Behavior During Therapy [facility] for tasks assessed/performed           Past Medical History:  Diagnosis Date  . Acute deep vein thrombosis (DVT) of left lower extremity ([place])   . Acute hypoxemic respiratory failure ([place])   . Anemia    Iron deficiency  . Anxiety   . Aortic atherosclerosis ([place])   . Arthritis   . Benign prostatic hyperplasia 08/28/2018   UNSPECIFIED WHETHER LOWER URINARY TRACT SYMPTOMS PRESENT  . CAD (coronary artery disease)   . Cancer ([place])   . Chronic back pain   . Chronic hip pain   . Constipation    due to Iron M-W-F  . COPD exacerbation ([place]) 10/18/2017  . Dyspnea   . Emphysema lung ([place])   . Essential hypertension   . GERD (gastroesophageal reflux disease)   . Gout   . Gross hematuria 09/08/2018  . Head injury, acute, with loss of consciousness ([place])    unsure how long he was unresponsive  . History of blood transfusion 05/2019  . History of kidney stones   . History of tobacco abuse   . Hypoxia   . Insomnia   . 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 Pneumonia    in 1980's  . Pre-diabetes   . Pulmonary embolism ([place]) 05/31/2018   . Renal mass    CONCERNING FOR RENAL CELL CARCINOMA DR. Lovena Neighbours  . Respiratory distress   . Respiratory failure, acute-on-chronic ([place]) 08/03/2018  . Seasonal allergies   . Sleep apnea     Past Surgical History:  Procedure Laterality Date  . COLONOSCOPY WITH PROPOFOL N/A 09/27/2019   Procedure: COLONOSCOPY WITH PROPOFOL;  Surgeon: Irene Shipper, MD;  [facility];  Service: Endoscopy;  Laterality: N/A;  . CYSTOSCOPY/URETEROSCOPY/HOLMIUM LASER/STENT PLACEMENT Left 10/31/2018   Procedure: CYSTOSCOPY/URETEROSCOPY;  Surgeon: Ceasar Mons, MD;  [facility];  Service: Urology;  Laterality: Left;  . ESOPHAGOGASTRODUODENOSCOPY (EGD) WITH PROPOFOL N/A 09/27/2019   Procedure: ESOPHAGOGASTRODUODENOSCOPY (EGD) WITH PROPOFOL;  Surgeon: Irene Shipper, MD;  [facility];  Service: Endoscopy;  Laterality: N/A;  . HERNIA REPAIR Left 1977   inguinal  . HYDROCELE EXCISION Left 05/13/2019   Procedure: HYDROCELECTOMY ADULT;  Surgeon: Ceasar Mons, MD;  [facility];  Service: Urology;  Laterality: Left;  Marland Kitchen MANDIBLE SURGERY  1976   EXTENSIVE  . POLYPECTOMY  09/27/2019   Procedure: POLYPECTOMY;  Surgeon: Irene Shipper, MD;  [facility];  Service: Endoscopy;;  . ROBOT ASSITED LAPAROSCOPIC  NEPHROURETERECTOMY Left 10/31/2018   Procedure: XI ROBOT ASSITED LAPAROSCOPIC NEPHROURETERECTOMY;  Surgeon: Ceasar Mons, MD;  [facility];  Service: Urology;  Laterality: Left;  ONLY NEEDS 240 MIN FOR ALL PROCEDURES  . SHOULDER ARTHROSCOPY WITH ROTATOR CUFF REPAIR Left 04/12/2020   Procedure: LEFT SHOULDER ARTHROSCOPY, DEBRIDEMENT, BICEPS TENODESIS, MINI OPEN ROTATOR CUFF TEAR REPAIR;  Surgeon: Meredith Pel, MD;  Location: [facility];  Service: Orthopedics;  Laterality: Left;    There were no vitals filed for this visit.   Subjective Assessment - 06/17/20 1038    Subjective Pt arriving to therapy reporting 1-2/10 pain in L shoulder. Pt with  increased SHOB when walking into gym and was not able to keep his mask on . O2 sats 94% on 2 L Nasal cannula He needed sitting rest break and had to use his inhaler.  Pt's HR was 107bpm. He was not able to maintain wearing his mask due to [facility] so he was moved to private treatment room where he could exercise with mask down to make it easier for him to catch his breath.    Pertinent History Pt on 2L oxygen via nasal cannula,    Diagnostic tests x-ray    Patient Stated Goals Stop hurting, be able to sleep    Pain Onset More than a month ago             [facility] Adult PT Treatment/Exercise - 06/17/20 0001      Shoulder Exercises: Seated   Retraction AROM;20 reps    External Rotation AROM;Both;15 reps    Flexion AAROM;Both;15 reps   hands clasped together   Other Seated Exercises gripping towel 20 reps    Other Seated Exercises UE reanger flexion and circles X 15      Shoulder Exercises: Pulleys   Flexion 3 minutes    Scaption 2 minutes      Modalities   Modalities Vasopneumatic      Vasopneumatic   Number Minutes Vasopneumatic  10 minutes    Vasopnuematic Location  Shoulder    Vasopneumatic Pressure --   No compression as this made it harder for him to breathe   Vasopneumatic Temperature  34      Manual Therapy   Passive ROM Lt shoulder PROM all planes in siting to tolerance                    PT Short Term Goals - 06/14/20 1014      PT SHORT TERM GOAL #1   Title Pt will be independent in his initial HEP.    Status On-going      PT SHORT TERM GOAL #2   Title Pt will be able to improve his Left Shoulder flexion to >/= 140 degrees with pain </= 3/10.    Status On-going             PT Long Term Goals - 05/30/20 1101      PT LONG TERM GOAL #1   Title Pt will improve his FOTO score from 41% limitation to </= 31% limitation.    Time 8    Period Weeks    Status New    Target Date 07/25/20      PT LONG TERM GOAL #2   Title Pt will be able to improve his Left  shoulder flexion to >/= 160 degrees wtih pain </= 3/10.    Baseline 120 degrees on 05/30/2020 with pain 7/10    Time 8  Period Weeks    Status New      PT LONG TERM GOAL #3   Title Pt will improve his L shoulder ER to >/= 70 degrees to improve functional mobility.    Baseline 48 degrees on 05/30/2020    Time 8    Period Weeks    Status New      PT LONG TERM GOAL #4   Title Pt will be able to report better sleep improvement of 50%.    Baseline waking up every night around 3am    Time 8    Status New                 Plan - 06/17/20 1046    Clinical Impression Statement Limited by [facility] and needs frequent extended rest breaks and close monitor of O2 sats. Used private room so he could pull his mask down at times to catch his breath. PT will attempt to continue to progress ROM and gentle strength as able.    Personal Factors and Comorbidities Comorbidity 3+    Comorbidities anxiety, GERD, arthritis, COPD, CAD, acute respiratory failure, gout, sleep apnea, head injury with loss of conscious    Examination-Activity Limitations Lift;Dressing;Reach Overhead;Carry    Examination-Participation Restrictions Community Activity;Other    Stability/Clinical Decision Making Stable/Uncomplicated    Rehab Potential Good    PT Frequency 2x / week    PT Duration 8 weeks    PT Treatment/Interventions ADLs/Self Care Home Management;Cryotherapy;Electrical Stimulation;Ultrasound;Moist Heat;Functional mobility training;Stair training;Therapeutic activities;Therapeutic exercise;Balance training;Neuromuscular re-education;Patient/family education;Passive range of motion;Manual techniques;Taping    PT Next Visit Plan rotator cuff repair protocol, Shoulder ROM, if you use vaso then use with no compression due to [place] [id number]    Consulted and Agree with Plan of Care Patient           Patient will benefit from skilled therapeutic intervention in order to improve the  following deficits and impairments:  Pain,Postural dysfunction,Decreased strength,Increased edema,Impaired flexibility,Decreased range of motion  Visit Diagnosis: Acute pain of left shoulder  Stiffness of left shoulder, not elsewhere classified  Muscle weakness (generalized)  Localized edema     Problem List Patient Active Problem List   Diagnosis Date Noted  . Benign neoplasm of transverse colon   . Benign neoplasm of descending colon   . Benign neoplasm of rectum   . Heme positive stool   . Iron deficiency anemia due to chronic blood loss   . Melena   . Abdominal pain, chronic, right lower quadrant   . Chronic anticoagulation   . Symptomatic anemia 09/24/2019  . Pneumonia due to severe acute respiratory syndrome coronavirus 2 (SARS-CoV-2) 02/28/2019  . Renal mass 10/31/2018  . Chest pressure 10/15/2018  . Coronary artery calcification 10/15/2018  . Respiratory failure, acute-on-chronic ([place]) 08/03/2018  . Acute deep vein thrombosis (DVT) of left lower extremity ([place])   . Acute hypoxemic respiratory failure ([place])   . Pulmonary embolism ([place]) 05/31/2018  . COPD exacerbation ([place]) 10/18/2017  . Hypoxia   . Respiratory distress   . Essential hypertension     Silvestre Mesi 06/17/2020, 10:56 AM  [facility] Physical Therapy [address] [place], [place], [address] Phone: [phone]   Fax:  [phone]  Name: Gregory Christensen MRN: [id number] Date of Birth: 11-18-1950

## 2020-06-20 ENCOUNTER — Emergency Department (HOSPITAL_COMMUNITY): Payer: No Typology Code available for payment source

## 2020-06-20 ENCOUNTER — Other Ambulatory Visit: Payer: Self-pay

## 2020-06-20 ENCOUNTER — Encounter (HOSPITAL_COMMUNITY): Payer: Self-pay | Admitting: Emergency Medicine

## 2020-06-20 ENCOUNTER — Emergency Department (HOSPITAL_COMMUNITY)
Admission: EM | Admit: 2020-06-20 | Discharge: 2020-06-20 | Disposition: A | Discharge status: Home / Self Care | Payer: No Typology Code available for payment source | Attending: Emergency Medicine | Admitting: Emergency Medicine

## 2020-06-20 DIAGNOSIS — Z7982 Long term (current) use of aspirin: Secondary | ICD-10-CM | POA: Insufficient documentation

## 2020-06-20 DIAGNOSIS — Z7951 Long term (current) use of inhaled steroids: Secondary | ICD-10-CM | POA: Diagnosis not present

## 2020-06-20 DIAGNOSIS — R0602 Shortness of breath: Secondary | ICD-10-CM

## 2020-06-20 DIAGNOSIS — Z79899 Other long term (current) drug therapy: Secondary | ICD-10-CM | POA: Insufficient documentation

## 2020-06-20 DIAGNOSIS — F17210 Nicotine dependence, cigarettes, uncomplicated: Secondary | ICD-10-CM | POA: Insufficient documentation

## 2020-06-20 DIAGNOSIS — J449 Chronic obstructive pulmonary disease, unspecified: Secondary | ICD-10-CM | POA: Diagnosis not present

## 2020-06-20 DIAGNOSIS — I10 Essential (primary) hypertension: Secondary | ICD-10-CM | POA: Insufficient documentation

## 2020-06-20 DIAGNOSIS — J441 Chronic obstructive pulmonary disease with (acute) exacerbation: Secondary | ICD-10-CM

## 2020-06-20 DIAGNOSIS — Z955 Presence of coronary angioplasty implant and graft: Secondary | ICD-10-CM | POA: Insufficient documentation

## 2020-06-20 DIAGNOSIS — Z7901 Long term (current) use of anticoagulants: Secondary | ICD-10-CM | POA: Diagnosis not present

## 2020-06-20 DIAGNOSIS — I2510 Atherosclerotic heart disease of native coronary artery without angina pectoris: Secondary | ICD-10-CM | POA: Diagnosis not present

## 2020-06-20 LAB — BASIC METABOLIC PANEL
Anion gap: 10 (ref 5–15)
BUN: 13 mg/dL (ref 8–23)
CO2: 24 mmol/L (ref 22–32)
Calcium: 9.2 mg/dL (ref 8.9–10.3)
Chloride: 104 mmol/L (ref 98–111)
Creatinine, Ser: 1.56 mg/dL — ABNORMAL HIGH (ref 0.61–1.24)
GFR, Estimated: 48 mL/min — ABNORMAL LOW (ref >60)
Glucose, Bld: 99 mg/dL (ref 70–99)
Potassium: 3.7 mmol/L (ref 3.5–5.1)
Sodium: 138 mmol/L (ref 135–145)

## 2020-06-20 LAB — CBC
HCT: 43.2 % (ref 39.0–52.0)
Hemoglobin: 13.5 g/dL (ref 13.0–17.0)
MCH: 28.9 pg (ref 26.0–34.0)
MCHC: 31.3 g/dL (ref 30.0–36.0)
MCV: 92.5 fL (ref 80.0–100.0)
Platelets: 208 10*3/uL (ref 150–400)
RBC: 4.67 MIL/uL (ref 4.22–5.81)
RDW: 13.6 % (ref 11.5–15.5)
WBC: 6.2 10*3/uL (ref 4.0–10.5)
nRBC: 0 % (ref 0.0–0.2)

## 2020-06-20 MED ORDER — IPRATROPIUM-ALBUTEROL 0.5-2.5 (3) MG/3ML IN SOLN
3.0000 mL | Freq: Once | RESPIRATORY_TRACT | Status: AC
  Administered 2020-06-20: 15:00:00 3 mL via RESPIRATORY_TRACT
  Filled 2020-06-20: qty 3

## 2020-06-20 MED ORDER — ALBUTEROL SULFATE (2.5 MG/3ML) 0.083% IN NEBU
2.5000 mg | INHALATION_SOLUTION | Freq: Once | RESPIRATORY_TRACT | Status: AC
  Administered 2020-06-20: 16:00:00 2.5 mg via RESPIRATORY_TRACT
  Filled 2020-06-20: qty 3

## 2020-06-20 MED ORDER — PREDNISONE 20 MG PO TABS: 40.0000 mg | ORAL_TABLET | Freq: Every day | ORAL | 0 refills | Status: DC

## 2020-06-20 NOTE — ED Triage Notes (Signed)
Per EMS, patient from home, reports SOB today. Hx COPD. Reports using inhaler without relief. Breathing treatment with EMS with relief.  20g L hand  125mg  Solumedrol 2L [place] at baseline

## 2020-06-20 NOTE — ED Provider Notes (Signed)
[facility] EMERGENCY DEPARTMENT Provider Note  CSN: [id number] Arrival date & time: 06/20/20 1400    History Chief Complaint  Patient presents with  . Shortness of Breath    HPI  Gregory Christensen is a 69 y.o. male with history of COPD, chronic hypoxia on 2L [place] at all times reports 2-3 days of increasing SOB, dry cough and wheezing. No fever, no change in sputum. He called EMS today and was given solumedrol and albuterol neb with some improvement but reports he still feels SOB with walking.    Past Medical History:  Diagnosis Date  . Acute deep vein thrombosis (DVT) of left lower extremity ([place])   . Acute hypoxemic respiratory failure ([place])   . Anemia    Iron deficiency  . Anxiety   . Aortic atherosclerosis ([place])   . Arthritis   . Benign prostatic hyperplasia 08/28/2018   UNSPECIFIED WHETHER LOWER URINARY TRACT SYMPTOMS PRESENT  . CAD (coronary artery disease)   . Cancer ([place])   . Chronic back pain   . Chronic hip pain   . Constipation    due to Iron M-W-F  . COPD exacerbation ([place]) 10/18/2017  . Dyspnea   . Emphysema lung ([place])   . Essential hypertension   . GERD (gastroesophageal reflux disease)   . Gout   . Gross hematuria 09/08/2018  . Head injury, acute, with loss of consciousness ([place])    unsure how long he was unresponsive  . History of blood transfusion 05/2019  . History of kidney stones   . History of tobacco abuse   . Hypoxia   . Insomnia   . 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 Pneumonia    in 1980's  . Pre-diabetes   . Pulmonary embolism ([place]) 05/31/2018  . Renal mass    CONCERNING FOR RENAL CELL CARCINOMA DR. Lovena Neighbours  . Respiratory distress   . Respiratory failure, acute-on-chronic ([place]) 08/03/2018  . Seasonal allergies   . Sleep apnea     Past Surgical History:  Procedure Laterality Date  . COLONOSCOPY WITH PROPOFOL N/A 09/27/2019   Procedure: COLONOSCOPY WITH PROPOFOL;  Surgeon: Irene Shipper, MD;  [facility];  Service: Endoscopy;   Laterality: N/A;  . CYSTOSCOPY/URETEROSCOPY/HOLMIUM LASER/STENT PLACEMENT Left 10/31/2018   Procedure: CYSTOSCOPY/URETEROSCOPY;  Surgeon: Ceasar Mons, MD;  [facility];  Service: Urology;  Laterality: Left;  . ESOPHAGOGASTRODUODENOSCOPY (EGD) WITH PROPOFOL N/A 09/27/2019   Procedure: ESOPHAGOGASTRODUODENOSCOPY (EGD) WITH PROPOFOL;  Surgeon: Irene Shipper, MD;  [facility];  Service: Endoscopy;  Laterality: N/A;  . HERNIA REPAIR Left 1977   inguinal  . HYDROCELE EXCISION Left 05/13/2019   Procedure: HYDROCELECTOMY ADULT;  Surgeon: Ceasar Mons, MD;  [facility];  Service: Urology;  Laterality: Left;  Marland Kitchen MANDIBLE SURGERY  1976   EXTENSIVE  . POLYPECTOMY  09/27/2019   Procedure: POLYPECTOMY;  Surgeon: Irene Shipper, MD;  Location: [facility];  Service: Endoscopy;;  . ROBOT ASSITED LAPAROSCOPIC NEPHROURETERECTOMY Left 10/31/2018   Procedure: XI ROBOT ASSITED LAPAROSCOPIC NEPHROURETERECTOMY;  Surgeon: Ceasar Mons, MD;  [facility];  Service: Urology;  Laterality: Left;  ONLY NEEDS 240 MIN FOR ALL PROCEDURES  . SHOULDER ARTHROSCOPY WITH ROTATOR CUFF REPAIR Left 04/12/2020   Procedure: LEFT SHOULDER ARTHROSCOPY, DEBRIDEMENT, BICEPS TENODESIS, MINI OPEN ROTATOR CUFF TEAR REPAIR;  Surgeon: Meredith Pel, MD;  Location: [facility];  Service: Orthopedics;  Laterality: Left;    Family History  Problem Relation Age of Onset  .  Diabetes Mother   . Hypertension Sister   . Hypertension Brother     Social History   Tobacco Use  . Smoking status: Current Every Day Smoker    Packs/day: 0.50    Years: 57.00    Pack years: 28.50    Types: Cigarettes  . Smokeless tobacco: Never Used  Vaping Use  . Vaping Use: Never used  Substance Use Topics  . Alcohol use: Not Currently    Alcohol/week: 10.0 standard drinks    Types: 10 Glasses of wine per week    Comment: 0-2 beers a day   . Drug use: Not Currently    Types: Flunitrazepam      Home Medications Prior to Admission medications   Medication Sig Start Date End Date Taking? Authorizing Provider  albuterol (PROVENTIL HFA;VENTOLIN HFA) 108 (90 Base) MCG/ACT inhaler Inhale 2 puffs into the lungs every 2 (two) hours as needed for wheezing or shortness of breath. 08/04/18   Reyne Dumas, MD  albuterol (PROVENTIL) (2.5 MG/3ML) 0.083% nebulizer solution Take 3 mLs (2.5 mg total) by nebulization every 6 (six) hours as needed for wheezing or shortness of breath. 08/04/18   Reyne Dumas, MD  amLODipine (NORVASC) 2.5 MG tablet Take 2.5 mg by mouth daily.    [provider]  apixaban (ELIQUIS) 5 MG TABS tablet Take 1 tablet (5 mg total) by mouth 2 (two) times daily. 06/10/18   Regalado, Belkys A, MD  budesonide-formoterol (SYMBICORT) 160-4.5 MCG/ACT inhaler Inhale 1 puff into the lungs 2 (two) times daily. Patient taking differently: Inhale 1 puff into the lungs 2 (two) times daily as needed (shortness of breath).  08/04/18   Reyne Dumas, MD  cephALEXin (KEFLEX) 500 MG capsule Take 1 capsule (500 mg total) by mouth 2 (two) times daily. Patient not taking: Reported on 05/07/2020 04/13/20   Orpah Greek, MD  Dextran 70-Hypromellose (ARTIFICIAL TEARS) 0.1-0.3 % SOLN Place 1 drop into both eyes daily as needed (dry eyes).    [provider]  diclofenac Sodium (VOLTAREN) 1 % GEL Apply 2 g topically 2 (two) times daily as needed (Pain).    [provider]  docusate sodium (COLACE) 100 MG capsule Take 100 mg by mouth daily as needed for mild constipation.    [provider]  doxycycline (VIBRAMYCIN) 100 MG capsule Take 1 capsule (100 mg total) by mouth 2 (two) times daily. 05/07/20   Orpah Greek, MD  famotidine (PEPCID) 20 MG tablet Take 20 mg by mouth daily.    [provider]  ferrous sulfate 325 (65 FE) MG tablet Take 325 mg by mouth daily with breakfast.    [provider]  fluticasone (FLONASE) 50 MCG/ACT nasal  spray Place 1 spray into both nostrils 2 (two) times daily. Patient taking differently: Place 1 spray into both nostrils daily as needed for allergies.  08/04/18   Reyne Dumas, MD  gabapentin (NEURONTIN) 300 MG capsule Take 400 mg by mouth 3 (three) times daily.     [provider]  HYDROcodone-acetaminophen (NORCO/VICODIN) 5-325 MG tablet Take 1 tablet by mouth daily as needed for moderate pain. 06/07/20 06/07/21  Magnant, Gerrianne Scale, PA-C  hydroxypropyl methylcellulose / hypromellose (ISOPTO TEARS / GONIOVISC) 2.5 % ophthalmic solution Place 1 drop into both eyes 3 (three) times daily as needed for dry eyes.    [provider]  lidocaine (LIDODERM) 5 % Place 1 patch onto the skin daily. Remove & Discard patch within 12 hours or as directed by MD  [provider]  loratadine (CLARITIN) 10 MG tablet Take 10 mg by mouth daily as needed for allergies.     [provider]  methocarbamol (ROBAXIN) 500 MG tablet Take 1 tablet (500 mg total) by mouth every 8 (eight) hours as needed. 06/07/20   Magnant, Jearldine Cassady L, PA-C  montelukast (SINGULAIR) 10 MG tablet Take 10 mg by mouth at bedtime.    [provider]  pantoprazole (PROTONIX) 40 MG tablet Take 40 mg by mouth daily.    [provider]  pravastatin (PRAVACHOL) 20 MG tablet Take 20 mg by mouth at bedtime.    [provider]  predniSONE (DELTASONE) 20 MG tablet Take 2 tablets (40 mg total) by mouth daily with breakfast. 06/20/20   Truddie Hidden, MD  tamsulosin (FLOMAX) 0.4 MG CAPS capsule Take 0.4 mg by mouth at bedtime.     [provider]  Tiotropium Bromide Monohydrate (SPIRIVA RESPIMAT) 2.5 MCG/ACT AERS Inhale 2 puffs into the lungs daily.    [provider]     Allergies    Patient has no known allergies.   Review of Systems   Review of Systems A comprehensive review of systems was completed and negative except as noted in HPI.    Physical Exam BP 127/73    Pulse 80   Temp 97.8 F (36.6 C) (Oral)   Resp (!) 27   Ht 6\' 1"  (1.854 m)   Wt 74.8 kg   SpO2 100%   BMI 21.77 kg/m   Physical Exam Vitals and nursing note reviewed.  Constitutional:      Appearance: Normal appearance.  HENT:     Head: Normocephalic and atraumatic.     Nose: Nose normal.     Mouth/Throat:     Mouth: Mucous membranes are moist.  Eyes:     Extraocular Movements: Extraocular movements intact.     Conjunctiva/sclera: Conjunctivae normal.  Cardiovascular:     Rate and Rhythm: Normal rate.  Pulmonary:     Effort: Pulmonary effort is normal.     Breath sounds: Wheezing present.  Abdominal:     General: Abdomen is flat.     Palpations: Abdomen is soft.     Tenderness: There is no abdominal tenderness.  Musculoskeletal:        General: No swelling. Normal range of motion.     Cervical back: Neck supple.  Skin:    General: Skin is warm and dry.  Neurological:     General: No focal deficit present.     Mental Status: He is alert.  Psychiatric:        Mood and Affect: Mood normal.      ED Results / Procedures / Treatments   Labs (all labs ordered are listed, but only abnormal results are displayed) Labs Reviewed  BASIC METABOLIC PANEL - Abnormal; Notable for the following components:      Result Value   Creatinine, Ser 1.56 (*)    GFR, Estimated 48 (*)    All other components within normal limits  CBC    EKG EKG Interpretation  Date/Time:  Monday June 20 2020 14:11:23 EST Ventricular Rate:  86 PR Interval:    QRS Duration: 83 QT Interval:  385 QTC Calculation: 461 R Axis:   8 Text Interpretation: Sinus rhythm Right atrial enlargement Borderline low voltage, extremity leads Nonspecific T abnormalities, anterior leads No significant change since last tracing Confirmed by Calvert Cantor [phone]) on 06/20/2020 2:49:53 PM   Radiology DG Chest [facility]  1 View  Result Date: 06/20/2020 CLINICAL DATA:  Shortness of breath, history of COPD.   Tobacco use. EXAM: PORTABLE CHEST 1 VIEW COMPARISON:  Chest x-ray 05/07/2020, CT chest 09/26/2018. FINDINGS: The heart size and mediastinal contours are within normal limits. Slight hyperinflation of the lungs consistent with emphysematous changes. No focal consolidation. No pulmonary edema. No pleural effusion. No pneumothorax. No acute osseous abnormality.  Old healed right rib fracture noted. IMPRESSION: 1. No active disease. 2. If clinically appropriate, please evaluate patient on age and smoking history to determine if patient meets the inclusion criteria for specialized low-dose lung cancer screening chest CT. Electronically Signed   By: Iven Finn M.D.   On: 06/20/2020 15:29    Procedures Procedures  Medications Ordered in the ED Medications  ipratropium-albuterol (DUONEB) 0.5-2.5 (3) MG/3ML nebulizer solution 3 mL (3 mLs Nebulization Given 06/20/20 1455)     MDM Rules/Calculators/A&P MDM Patient continues to have wheezing, no hypoxia, Will check labs, CXR and give additional nebs.   ED Course  I have reviewed the triage vital signs and the nursing notes.  Pertinent labs & imaging results that were available during my care of the patient were reviewed by me and considered in my medical decision making (see chart for details).  Clinical Course as of 06/20/20 1617  Mon Jun 20, 2020  1508 CBC normal.  [CS]  7902 BMP with CKD at baseline, otherwise normal.  [CS]  1603 CXR images and results reviewed.  [CS]  4097 Patient reports improvement after Duoneb, would like to go home but wants one more neb before he leaves.  [CS]    Clinical Course User Index [CS] Truddie Hidden, MD    Final Clinical Impression(s) / ED Diagnoses Final diagnoses:  COPD exacerbation [facility])    Rx / DC Orders ED Discharge Orders         Ordered    predniSONE (DELTASONE) 20 MG tablet  Daily with breakfast        06/20/20 1610           Truddie Hidden, MD 06/20/20 [phone]

## 2020-06-21 ENCOUNTER — Ambulatory Visit (INDEPENDENT_AMBULATORY_CARE_PROVIDER_SITE_OTHER): Payer: No Typology Code available for payment source | Admitting: Physical Therapy

## 2020-06-21 DIAGNOSIS — M25612 Stiffness of left shoulder, not elsewhere classified: Secondary | ICD-10-CM

## 2020-06-21 DIAGNOSIS — M25512 Pain in left shoulder: Secondary | ICD-10-CM

## 2020-06-21 DIAGNOSIS — M6281 Muscle weakness (generalized): Secondary | ICD-10-CM | POA: Diagnosis not present

## 2020-06-21 DIAGNOSIS — R600 Localized edema: Secondary | ICD-10-CM | POA: Diagnosis not present

## 2020-06-21 NOTE — Therapy (Addendum)
[facility] Physical Therapy [address] [place], [place], [address] Phone: [phone]   Fax:  [phone]  Physical Therapy Treatment  Patient Details  Name: Gregory Christensen MRN: [id number] Date of Birth: 12/23/1950 Referring Provider (PT): Marcene Duos MD   Encounter Date: 06/21/2020   PT End of Session - 06/21/20 1021    Visit Number 5    Number of Visits 15    Date for PT Re-Evaluation 08/12/20    Authorization Type applied for extension to 08/12/2020    Authorization Time Period 2x/ week    Authorization - Visit Number 5    Authorization - Number of Visits 15    Progress Note Due on Visit 10    PT Start Time 9528    PT Stop Time 1057    PT Time Calculation (min) 42 min    Activity Tolerance Patient tolerated treatment well;Patient limited by fatigue    Behavior During Therapy [facility] for tasks assessed/performed           Past Medical History:  Diagnosis Date  . Acute deep vein thrombosis (DVT) of left lower extremity ([place])   . Acute hypoxemic respiratory failure ([place])   . Anemia    Iron deficiency  . Anxiety   . Aortic atherosclerosis ([place])   . Arthritis   . Benign prostatic hyperplasia 08/28/2018   UNSPECIFIED WHETHER LOWER URINARY TRACT SYMPTOMS PRESENT  . CAD (coronary artery disease)   . Cancer ([place])   . Chronic back pain   . Chronic hip pain   . Constipation    due to Iron M-W-F  . COPD exacerbation ([place]) 10/18/2017  . Dyspnea   . Emphysema lung ([place])   . Essential hypertension   . GERD (gastroesophageal reflux disease)   . Gout   . Gross hematuria 09/08/2018  . Head injury, acute, with loss of consciousness ([place])    unsure how long he was unresponsive  . History of blood transfusion 05/2019  . History of kidney stones   . History of tobacco abuse   . Hypoxia   . Insomnia   . 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 Pneumonia    in 1980's  . Pre-diabetes   . Pulmonary embolism ([place]) 05/31/2018  . Renal mass    CONCERNING FOR  RENAL CELL CARCINOMA DR. Lovena Neighbours  . Respiratory distress   . Respiratory failure, acute-on-chronic ([place]) 08/03/2018  . Seasonal allergies   . Sleep apnea     Past Surgical History:  Procedure Laterality Date  . COLONOSCOPY WITH PROPOFOL N/A 09/27/2019   Procedure: COLONOSCOPY WITH PROPOFOL;  Surgeon: Irene Shipper, MD;  [facility];  Service: Endoscopy;  Laterality: N/A;  . CYSTOSCOPY/URETEROSCOPY/HOLMIUM LASER/STENT PLACEMENT Left 10/31/2018   Procedure: CYSTOSCOPY/URETEROSCOPY;  Surgeon: Ceasar Mons, MD;  [facility];  Service: Urology;  Laterality: Left;  . ESOPHAGOGASTRODUODENOSCOPY (EGD) WITH PROPOFOL N/A 09/27/2019   Procedure: ESOPHAGOGASTRODUODENOSCOPY (EGD) WITH PROPOFOL;  Surgeon: Irene Shipper, MD;  [facility];  Service: Endoscopy;  Laterality: N/A;  . HERNIA REPAIR Left 1977   inguinal  . HYDROCELE EXCISION Left 05/13/2019   Procedure: HYDROCELECTOMY ADULT;  Surgeon: Ceasar Mons, MD;  [facility];  Service: Urology;  Laterality: Left;  Marland Kitchen MANDIBLE SURGERY  1976   EXTENSIVE  . POLYPECTOMY  09/27/2019   Procedure: POLYPECTOMY;  Surgeon: Irene Shipper, MD;  [facility];  Service: Endoscopy;;  . ROBOT ASSITED LAPAROSCOPIC NEPHROURETERECTOMY Left 10/31/2018   Procedure: XI ROBOT ASSITED  LAPAROSCOPIC NEPHROURETERECTOMY;  Surgeon: Ceasar Mons, MD;  [facility];  Service: Urology;  Laterality: Left;  ONLY NEEDS 240 MIN FOR ALL PROCEDURES  . SHOULDER ARTHROSCOPY WITH ROTATOR CUFF REPAIR Left 04/12/2020   Procedure: LEFT SHOULDER ARTHROSCOPY, DEBRIDEMENT, BICEPS TENODESIS, MINI OPEN ROTATOR CUFF TEAR REPAIR;  Surgeon: Meredith Pel, MD;  Location: [facility];  Service: Orthopedics;  Laterality: Left;    There were no vitals filed for this visit.   Subjective Assessment - 06/21/20 1018    Subjective Pt arriving today reporting 3/10 pain in L shoulder. Pt with recent visit to ED  06/20/2020 due to SOB. Pt  states MD told him to increase his O2 levels to 3L as needed.    Pertinent History Pt on 2L oxygen via nasal cannula,    Diagnostic tests x-ray    Patient Stated Goals Stop hurting, be able to sleep    Currently in Pain? Yes    Pain Score 3     Pain Location Shoulder    Pain Orientation Left    Pain Descriptors / Indicators Aching    Pain Type Acute pain    Pain Onset More than a month ago              [facility] PT Assessment - 06/21/20 0001      Assessment   Medical Diagnosis L rotator cuff repair on 04/12/2020,     Referring Provider (PT) Marcene Duos MD    Onset Date/Surgical Date 04/12/20    Hand Dominance Right      Precautions   Precautions Shoulder    Type of Shoulder Precautions rotator cuff surgery on 04/12/2020      Posture/Postural Control   Posture/Postural Control Postural limitations    Postural Limitations Rounded Shoulders;Forward head;Decreased lumbar lordosis      AROM   Left Shoulder Extension 50 Degrees    Left Shoulder Flexion 142 Degrees    Left Shoulder ABduction 120 Degrees    Left Shoulder External Rotation 55 Degrees                         [facility] Adult PT Treatment/Exercise - 06/21/20 0001      Exercises   Exercises Shoulder      Shoulder Exercises: Supine   External Rotation AAROM;20 reps    Flexion 20 reps;AAROM      Shoulder Exercises: Seated   Retraction AROM;20 reps    External Rotation AROM;Both;15 reps    Flexion AAROM;Both;15 reps   hands clasped together   Other Seated Exercises scapular retraction holding 5 seconds x 10 reps    Other Seated Exercises UE reanger flexion and circles X 15      Shoulder Exercises: Pulleys   Flexion 2 minutes    Scaption 2 minutes      Modalities   Modalities Vasopneumatic      Vasopneumatic   Number Minutes Vasopneumatic  10 minutes    Vasopnuematic Location  Shoulder    Vasopneumatic Pressure --   no compression due to pt's SOB   Vasopneumatic Temperature  36                     PT Short Term Goals - 06/21/20 1036      PT SHORT TERM GOAL #1   Title Pt will be independent in his initial HEP.    Status Achieved      PT SHORT TERM GOAL #2   Title  Pt will be able to improve his Left Shoulder flexion to >/= 140 degrees with pain </= 3/10.    Baseline 140 degrees, pain at 3/10    Status On-going             PT Long Term Goals - 06/21/20 1036      PT LONG TERM GOAL #1   Title Pt will improve his FOTO score from 41% limitation to </= 31% limitation.    Status On-going      PT LONG TERM GOAL #2   Title Pt will be able to improve his Left shoulder flexion to >/= 160 degrees wtih pain </= 3/10.    Status On-going      PT LONG TERM GOAL #3   Title Pt will improve his L shoulder ER to >/= 70 degrees to improve functional mobility.    Baseline 55 degrees on 06/21/2020    Status On-going      PT LONG TERM GOAL #4   Title Pt will be able to report better sleep improvement of 50%.    Baseline waking up every night between 3 and 4 am    Status On-going               [facility] PT Assessment - 06/21/20 0001      Assessment   Medical Diagnosis L rotator cuff repair on 04/12/2020,     Referring Provider (PT) Marcene Duos MD    Onset Date/Surgical Date 04/12/20    Hand Dominance Right      Precautions   Precautions Shoulder    Type of Shoulder Precautions rotator cuff surgery on 04/12/2020      Posture/Postural Control   Posture/Postural Control Postural limitations    Postural Limitations Rounded Shoulders;Forward head;Decreased lumbar lordosis      AROM   Left Shoulder Extension 50 Degrees    Left Shoulder Flexion 142 Degrees    Left Shoulder ABduction 120 Degrees    Left Shoulder External Rotation 55 Degrees               Plan - 06/21/20 1033    Clinical Impression Statement Pt has made progress with overall L shoulder ROM (see objective flowsheets) in his 5 physical therapy visits. Pt sill reproting 3/10 pain in left  shoulder. I am requesting date extension for pt to complete his 10 more visits at frequency of 2x/ week to progress toward LTG's set.    Personal Factors and Comorbidities Comorbidity 3+    Comorbidities anxiety, GERD, arthritis, COPD, CAD, acute respiratory failure, gout, sleep apnea, head injury with loss of conscious    Examination-Participation Restrictions Community Activity;Other    Stability/Clinical Decision Making Stable/Uncomplicated    Rehab Potential Good    PT Frequency 2x / week    PT Duration 8 weeks    PT Treatment/Interventions ADLs/Self Care Home Management;Cryotherapy;Electrical Stimulation;Ultrasound;Moist Heat;Functional mobility training;Stair training;Therapeutic activities;Therapeutic exercise;Balance training;Neuromuscular re-education;Patient/family education;Passive range of motion;Manual techniques;Taping    PT Next Visit Plan rotator cuff repair protocol, Shoulder ROM, if you use vaso then use with no compression due to [place] [id number]    Consulted and Agree with Plan of Care Patient           Patient will benefit from skilled therapeutic intervention in order to improve the following deficits and impairments:  Pain,Postural dysfunction,Decreased strength,Increased edema,Impaired flexibility,Decreased range of motion  Visit Diagnosis: Acute pain of left shoulder - Plan: PT plan of care cert/re-cert  Stiffness of left shoulder, not elsewhere classified - Plan: PT plan of care cert/re-cert  Muscle weakness (generalized) - Plan: PT plan of care cert/re-cert  Localized edema - Plan: PT plan of care cert/re-cert     Problem List Patient Active Problem List   Diagnosis Date Noted  . Benign neoplasm of transverse colon   . Benign neoplasm of descending colon   . Benign neoplasm of rectum   . Heme positive stool   . Iron deficiency anemia due to chronic blood loss   . Melena   . Abdominal pain, chronic, right lower quadrant   .  Chronic anticoagulation   . Symptomatic anemia 09/24/2019  . Pneumonia due to severe acute respiratory syndrome coronavirus 2 (SARS-CoV-2) 02/28/2019  . Renal mass 10/31/2018  . Chest pressure 10/15/2018  . Coronary artery calcification 10/15/2018  . Respiratory failure, acute-on-chronic ([place]) 08/03/2018  . Acute deep vein thrombosis (DVT) of left lower extremity ([place])   . Acute hypoxemic respiratory failure ([place])   . Pulmonary embolism ([place]) 05/31/2018  . COPD exacerbation ([place]) 10/18/2017  . Hypoxia   . Respiratory distress   . Essential hypertension     Oretha Caprice, PT,MPT 06/21/2020, 11:15 AM  [facility] Physical Therapy [address] [place], [place], [address] Phone: [phone]   Fax:  [phone]  Name: Nima Kemppainen MRN: [id number] Date of Birth: [birth year]

## 2020-06-23 ENCOUNTER — Other Ambulatory Visit: Payer: Self-pay

## 2020-06-23 ENCOUNTER — Ambulatory Visit (INDEPENDENT_AMBULATORY_CARE_PROVIDER_SITE_OTHER): Payer: No Typology Code available for payment source | Admitting: Physical Therapy

## 2020-06-23 ENCOUNTER — Encounter: Payer: Self-pay | Admitting: Physical Therapy

## 2020-06-23 DIAGNOSIS — R600 Localized edema: Secondary | ICD-10-CM | POA: Diagnosis not present

## 2020-06-23 DIAGNOSIS — M25512 Pain in left shoulder: Secondary | ICD-10-CM | POA: Diagnosis not present

## 2020-06-23 DIAGNOSIS — M25612 Stiffness of left shoulder, not elsewhere classified: Secondary | ICD-10-CM

## 2020-06-23 DIAGNOSIS — M6281 Muscle weakness (generalized): Secondary | ICD-10-CM | POA: Diagnosis not present

## 2020-06-23 NOTE — Therapy (Signed)
[facility] Physical Therapy [address] [place], [place], [address] Phone: [phone]   Fax:  [phone]  Physical Therapy Treatment  Patient Details  Name: Gregory Christensen MRN: [id number] Date of Birth: [birth year] Referring Provider (PT): Marcene Duos MD   Encounter Date: 06/23/2020   PT End of Session - 06/23/20 1042    Visit Number 6    Number of Visits 15    Date for PT Re-Evaluation 08/12/20    Authorization Type applied for extension to 08/12/2020    Authorization Time Period 2x/ week    Authorization - Visit Number 6    Authorization - Number of Visits 15    Progress Note Due on Visit 10    PT Start Time 1015    PT Stop Time 1100    PT Time Calculation (min) 45 min    Activity Tolerance Patient tolerated treatment well;Patient limited by fatigue    Behavior During Therapy [facility] for tasks assessed/performed           Past Medical History:  Diagnosis Date   Acute deep vein thrombosis (DVT) of left lower extremity (HCC)    Acute hypoxemic respiratory failure (HCC)    Anemia    Iron deficiency   Anxiety    Aortic atherosclerosis (HCC)    Arthritis    Benign prostatic hyperplasia 08/28/2018   UNSPECIFIED WHETHER LOWER URINARY TRACT SYMPTOMS PRESENT   CAD (coronary artery disease)    Cancer ([place])    Chronic back pain    Chronic hip pain    Constipation    due to Iron M-W-F   COPD exacerbation ([place]) 10/18/2017   Dyspnea    Emphysema lung ([place])    Essential hypertension    GERD (gastroesophageal reflux disease)    Gout    Gross hematuria 09/08/2018   Head injury, acute, with loss of consciousness ([place])    unsure how long he was unresponsive   History of blood transfusion 05/2019   History of kidney stones    History of tobacco abuse    Hypoxia    Insomnia    Oxygen dependent    2l- 24/7   Pneumonia    in 1980's   Pre-diabetes    Pulmonary embolism ([place]) 05/31/2018   Renal mass    CONCERNING FOR  RENAL CELL CARCINOMA DR. Lovena Neighbours   Respiratory distress    Respiratory failure, acute-on-chronic ([place]) 08/03/2018   Seasonal allergies    Sleep apnea     Past Surgical History:  Procedure Laterality Date   COLONOSCOPY WITH PROPOFOL N/A 09/27/2019   Procedure: COLONOSCOPY WITH PROPOFOL;  Surgeon: Irene Shipper, MD;  [facility];  Service: Endoscopy;  Laterality: N/A;   CYSTOSCOPY/URETEROSCOPY/HOLMIUM LASER/STENT PLACEMENT Left 10/31/2018   Procedure: CYSTOSCOPY/URETEROSCOPY;  Surgeon: Ceasar Mons, MD;  [facility];  Service: Urology;  Laterality: Left;   ESOPHAGOGASTRODUODENOSCOPY (EGD) WITH PROPOFOL N/A 09/27/2019   Procedure: ESOPHAGOGASTRODUODENOSCOPY (EGD) WITH PROPOFOL;  Surgeon: Irene Shipper, MD;  [facility];  Service: Endoscopy;  Laterality: N/A;   HERNIA REPAIR Left 1977   inguinal   HYDROCELE EXCISION Left 05/13/2019   Procedure: HYDROCELECTOMY ADULT;  Surgeon: Ceasar Mons, MD;  [facility];  Service: Urology;  Laterality: Left;   [place]   EXTENSIVE   POLYPECTOMY  09/27/2019   Procedure: POLYPECTOMY;  Surgeon: Irene Shipper, MD;  [facility];  Service: Endoscopy;;   ROBOT ASSITED LAPAROSCOPIC NEPHROURETERECTOMY Left 10/31/2018   Procedure: XI ROBOT ASSITED  LAPAROSCOPIC NEPHROURETERECTOMY;  Surgeon: Ceasar Mons, MD;  [facility];  Service: Urology;  Laterality: Left;  ONLY NEEDS 240 MIN FOR ALL PROCEDURES   SHOULDER ARTHROSCOPY WITH ROTATOR CUFF REPAIR Left 04/12/2020   Procedure: LEFT SHOULDER ARTHROSCOPY, DEBRIDEMENT, BICEPS TENODESIS, MINI OPEN ROTATOR CUFF TEAR REPAIR;  Surgeon: Meredith Pel, MD;  Location: [facility];  Service: Orthopedics;  Laterality: Left;    There were no vitals filed for this visit.   Subjective Assessment - 06/23/20 1029    Subjective denies pain tody    Pertinent History Pt on 2L oxygen via nasal cannula,    Diagnostic tests x-ray     Patient Stated Goals Stop hurting, be able to sleep    Pain Onset More than a month ago              [facility] Adult PT Treatment/Exercise - 06/23/20 0001      Shoulder Exercises: Seated   Extension Both;20 reps    Theraband Level (Shoulder Extension) Level 2 (Red)    Row Both;20 reps    Theraband Level (Shoulder Row) Level 2 (Red)    External Rotation 15 reps;Both   at same time   Theraband Level (Shoulder External Rotation) Level 2 (Red)    Internal Rotation Left;15 reps    Theraband Level (Shoulder Internal Rotation) Level 2 (Red)    Flexion AROM;Both;15 reps    Abduction AROM;Both;15 reps    Other Seated Exercises seated chest press red X 20      Shoulder Exercises: Pulleys   Flexion 2 minutes    Scaption 2 minutes      Cryotherapy   Number Minutes Cryotherapy 7 Minutes    Cryotherapy Location Shoulder    Type of Cryotherapy Ice pack                    PT Short Term Goals - 06/21/20 1036      PT SHORT TERM GOAL #1   Title Pt will be independent in his initial HEP.    Status Achieved      PT SHORT TERM GOAL #2   Title Pt will be able to improve his Left Shoulder flexion to >/= 140 degrees with pain </= 3/10.    Baseline 140 degrees, pain at 3/10    Status On-going             PT Long Term Goals - 06/21/20 1036      PT LONG TERM GOAL #1   Title Pt will improve his FOTO score from 41% limitation to </= 31% limitation.    Status On-going      PT LONG TERM GOAL #2   Title Pt will be able to improve his Left shoulder flexion to >/= 160 degrees wtih pain </= 3/10.    Status On-going      PT LONG TERM GOAL #3   Title Pt will improve his L shoulder ER to >/= 70 degrees to improve functional mobility.    Baseline 55 degrees on 06/21/2020    Status On-going      PT LONG TERM GOAL #4   Title Pt will be able to report better sleep improvement of 50%.    Baseline waking up every night between 3 and 4 am    Status On-going                 Plan  - 06/23/20 1045    Clinical Impression Statement Overall ROM and strength progressing  but he continues to be most limited by overall endurance and SHOB. He showed good pain tolerance to increased strengthening today. PT will continue to progress as able.    Personal Factors and Comorbidities Comorbidity 3+    Comorbidities anxiety, GERD, arthritis, COPD, CAD, acute respiratory failure, gout, sleep apnea, head injury with loss of conscious    Examination-Participation Restrictions Community Activity;Other    Stability/Clinical Decision Making Stable/Uncomplicated    Rehab Potential Good    PT Frequency 2x / week    PT Duration 8 weeks    PT Treatment/Interventions ADLs/Self Care Home Management;Cryotherapy;Electrical Stimulation;Ultrasound;Moist Heat;Functional mobility training;Stair training;Therapeutic activities;Therapeutic exercise;Balance training;Neuromuscular re-education;Patient/family education;Passive range of motion;Manual techniques;Taping    PT Next Visit Plan rotator cuff repair protocol, Shoulder ROM, if you use vaso then use with no compression due to [place] [id number]    Consulted and Agree with Plan of Care Patient           Patient will benefit from skilled therapeutic intervention in order to improve the following deficits and impairments:  Pain,Postural dysfunction,Decreased strength,Increased edema,Impaired flexibility,Decreased range of motion  Visit Diagnosis: Acute pain of left shoulder  Stiffness of left shoulder, not elsewhere classified  Muscle weakness (generalized)  Localized edema     Problem List Patient Active Problem List   Diagnosis Date Noted   Benign neoplasm of transverse colon    Benign neoplasm of descending colon    Benign neoplasm of rectum    Heme positive stool    Iron deficiency anemia due to chronic blood loss    Melena    Abdominal pain, chronic, right lower quadrant    Chronic anticoagulation     Symptomatic anemia 09/24/2019   Pneumonia due to severe acute respiratory syndrome coronavirus 2 (SARS-CoV-2) 02/28/2019   Renal mass 10/31/2018   Chest pressure 10/15/2018   Coronary artery calcification 10/15/2018   Respiratory failure, acute-on-chronic ([place]) 08/03/2018   Acute deep vein thrombosis (DVT) of left lower extremity ([place])    Acute hypoxemic respiratory failure ([place])    Pulmonary embolism ([place]) 05/31/2018   COPD exacerbation ([place]) 10/18/2017   Hypoxia    Respiratory distress    Essential hypertension     Silvestre Mesi 06/23/2020, 10:49 AM  [facility] [address] [place], [place], [address] Phone: [phone]   Fax:  [phone]  Name: Chayse Zatarain MRN: [id number] Date of Birth: 1950/09/26

## 2020-07-05 DIAGNOSIS — J441 Chronic obstructive pulmonary disease with (acute) exacerbation: Secondary | ICD-10-CM | POA: Diagnosis not present

## 2020-07-07 ENCOUNTER — Encounter: Payer: Self-pay | Admitting: Specialist

## 2020-07-07 ENCOUNTER — Ambulatory Visit (INDEPENDENT_AMBULATORY_CARE_PROVIDER_SITE_OTHER): Payer: Medicare HMO | Admitting: Specialist

## 2020-07-07 ENCOUNTER — Other Ambulatory Visit: Payer: Self-pay

## 2020-07-07 VITALS — BP 105/65 | HR 99 | Ht 73.0 in | Wt 162.0 lb

## 2020-07-07 DIAGNOSIS — M542 Cervicalgia: Secondary | ICD-10-CM

## 2020-07-07 DIAGNOSIS — M4722 Other spondylosis with radiculopathy, cervical region: Secondary | ICD-10-CM | POA: Diagnosis not present

## 2020-07-07 DIAGNOSIS — M75122 Complete rotator cuff tear or rupture of left shoulder, not specified as traumatic: Secondary | ICD-10-CM

## 2020-07-07 NOTE — Progress Notes (Signed)
Office Visit Note   Patient: Gregory Christensen           Date of Birth: Dec 29, 1950           MRN: [id number] Visit Date: 07/07/2020              Requested by: Leighton Ruff, MD [place],  [place] [address] PCP: Leighton Ruff, MD   Assessment & Plan: Visit Diagnoses:  1. Other spondylosis with radiculopathy, cervical region   2. Cervicalgia   3. Complete tear of left rotator cuff, unspecified whether traumatic     Plan: Avoid overhead lifting and overhead use of the arms. Do not lift greater than 10-15 lbs. Adjust head rest in vehicle to prevent hyperextension if rear ended. Take extra precautions to avoid falling. PT to teach HEP for cervical spondylosis  Follow-Up Instructions: Return Needs a return appointment with Dr. Marlou Sa post op follow upf in the next 1-2 weeks., for Will see me as needed..   Orders:  Orders Placed This Encounter  Procedures  . Ambulatory referral to Physical Therapy   No orders of the defined types were placed in this encounter.     Procedures: No procedures performed   Clinical Data: No additional findings.   Subjective: Chief Complaint  Patient presents with  . Neck - Follow-up    Doing ok,     70 year old right dominant male with history of neck and left shoulder pain. Underwent recent left shoulder surgery with rotator cuff repair. Now with PT he is doing well has some neck stiffness but over all is doing well. No bowel or bladder, walking well, on Home O2 chronically post PED. No new compliaints. He is doing well no numbness or tingling in the arms.    Review of Systems  Constitutional: Negative.   HENT: Negative.   Eyes: Negative.   Respiratory: Negative.   Cardiovascular: Negative.   Gastrointestinal: Negative.   Endocrine: Negative.   Genitourinary: Negative.   Musculoskeletal: Negative.   Skin: Negative.   Allergic/Immunologic: Negative.   Neurological: Negative.   Hematological: Negative.    Psychiatric/Behavioral: Negative.      Objective: Vital Signs: BP 105/65 (BP Location: Left Arm, Patient Position: Sitting)   Pulse 99   Ht 6\' 1"  (1.854 m)   Wt 162 lb (73.5 kg)   BMI 21.37 kg/m   Physical Exam Constitutional:      Appearance: He is well-developed and well-nourished.  HENT:     Head: Normocephalic and atraumatic.  Eyes:     Extraocular Movements: EOM normal.     Pupils: Pupils are equal, round, and reactive to light.  Pulmonary:     Effort: Pulmonary effort is normal.     Breath sounds: Normal breath sounds.  Abdominal:     General: Bowel sounds are normal.     Palpations: Abdomen is soft.  Musculoskeletal:     Cervical back: Normal range of motion and neck supple.  Skin:    General: Skin is warm and dry.  Neurological:     Mental Status: He is alert and oriented to person, place, and time.  Psychiatric:        Mood and Affect: Mood and affect normal.        Behavior: Behavior normal.        Thought Content: Thought content normal.        Judgment: Judgment normal.     Back Exam   Tenderness  The patient is experiencing  tenderness in the cervical.   Right Shoulder Exam   Tenderness  The patient is experiencing tenderness in the acromioclavicular joint.  Range of Motion  Active abduction: normal  Passive abduction: normal  Extension: normal  External rotation: normal  Forward flexion: normal  Internal rotation 0 degrees: abnormal  Internal rotation 90 degrees: abnormal   Muscle Strength  Abduction: 5/5  Internal rotation: 5/5  External rotation: 5/5  Supraspinatus: 5/5  Subscapularis: 5/5  Biceps: 5/5    Left Shoulder Exam   Tenderness  The patient is experiencing tenderness in the acromioclavicular joint.  Range of Motion  Active abduction: abnormal  Passive abduction: abnormal  Extension: abnormal  External rotation: abnormal  Forward flexion: abnormal  Internal rotation 0 degrees: abnormal  Internal rotation 90  degrees: abnormal   Muscle Strength  Abduction: 5/5  Internal rotation: 5/5  External rotation: 5/5  Supraspinatus: 5/5  Subscapularis: 5/5  Biceps: 5/5   Tests  Apprehension: negative Hawkins test: negative Cross arm: negative Impingement: negative Drop arm: negative Sulcus: absent  Other  Erythema: absent Scars: absent       Specialty Comments:  No specialty comments available.  Imaging: No results found.   PMFS History: Patient Active Problem List   Diagnosis Date Noted  . Benign neoplasm of transverse colon   . Benign neoplasm of descending colon   . Benign neoplasm of rectum   . Heme positive stool   . Iron deficiency anemia due to chronic blood loss   . Melena   . Abdominal pain, chronic, right lower quadrant   . Chronic anticoagulation   . Symptomatic anemia 09/24/2019  . Pneumonia due to severe acute respiratory syndrome coronavirus 2 (SARS-CoV-2) 02/28/2019  . Renal mass 10/31/2018  . Chest pressure 10/15/2018  . Coronary artery calcification 10/15/2018  . Respiratory failure, acute-on-chronic ([place]) 08/03/2018  . Acute deep vein thrombosis (DVT) of left lower extremity ([place])   . Acute hypoxemic respiratory failure ([place])   . Pulmonary embolism ([place]) 05/31/2018  . COPD exacerbation ([place]) 10/18/2017  . Hypoxia   . Respiratory distress   . Essential hypertension    Past Medical History:  Diagnosis Date  . Acute deep vein thrombosis (DVT) of left lower extremity ([place])   . Acute hypoxemic respiratory failure ([place])   . Anemia    Iron deficiency  . Anxiety   . Aortic atherosclerosis ([place])   . Arthritis   . Benign prostatic hyperplasia 08/28/2018   UNSPECIFIED WHETHER LOWER URINARY TRACT SYMPTOMS PRESENT  . CAD (coronary artery disease)   . Cancer ([place])   . Chronic back pain   . Chronic hip pain   . Constipation    due to Iron M-W-F  . COPD exacerbation ([place]) 10/18/2017  . Dyspnea   . Emphysema lung ([place])   . Essential hypertension   .  GERD (gastroesophageal reflux disease)   . Gout   . Gross hematuria 09/08/2018  . Head injury, acute, with loss of consciousness ([place])    unsure how long he was unresponsive  . History of blood transfusion 05/2019  . History of kidney stones   . History of tobacco abuse   . Hypoxia   . Insomnia   . 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 Pneumonia    in 1980's  . Pre-diabetes   . Pulmonary embolism ([place]) 05/31/2018  . Renal mass    CONCERNING FOR RENAL CELL CARCINOMA DR. Lovena Neighbours  . Respiratory distress   . Respiratory failure, acute-on-chronic (  [place]) 08/03/2018  . Seasonal allergies   . Sleep apnea     Family History  Problem Relation Age of Onset  . Diabetes Mother   . Hypertension Sister   . Hypertension Brother     Past Surgical History:  Procedure Laterality Date  . COLONOSCOPY WITH PROPOFOL N/A 09/27/2019   Procedure: COLONOSCOPY WITH PROPOFOL;  Surgeon: Irene Shipper, MD;  [facility];  Service: Endoscopy;  Laterality: N/A;  . CYSTOSCOPY/URETEROSCOPY/HOLMIUM LASER/STENT PLACEMENT Left 10/31/2018   Procedure: CYSTOSCOPY/URETEROSCOPY;  Surgeon: Ceasar Mons, MD;  [facility];  Service: Urology;  Laterality: Left;  . ESOPHAGOGASTRODUODENOSCOPY (EGD) WITH PROPOFOL N/A 09/27/2019   Procedure: ESOPHAGOGASTRODUODENOSCOPY (EGD) WITH PROPOFOL;  Surgeon: Irene Shipper, MD;  [facility];  Service: Endoscopy;  Laterality: N/A;  . HERNIA REPAIR Left 1977   inguinal  . HYDROCELE EXCISION Left 05/13/2019   Procedure: HYDROCELECTOMY ADULT;  Surgeon: Ceasar Mons, MD;  [facility];  Service: Urology;  Laterality: Left;  Marland Kitchen MANDIBLE SURGERY  1976   EXTENSIVE  . POLYPECTOMY  09/27/2019   Procedure: POLYPECTOMY;  Surgeon: Irene Shipper, MD;  [facility];  Service: Endoscopy;;  . ROBOT ASSITED LAPAROSCOPIC NEPHROURETERECTOMY Left 10/31/2018   Procedure: XI ROBOT ASSITED LAPAROSCOPIC NEPHROURETERECTOMY;  Surgeon: Ceasar Mons, MD;  [facility];  Service: Urology;  Laterality: Left;  ONLY NEEDS 240 MIN FOR ALL PROCEDURES  . SHOULDER ARTHROSCOPY WITH ROTATOR CUFF REPAIR Left 04/12/2020   Procedure: LEFT SHOULDER ARTHROSCOPY, DEBRIDEMENT, BICEPS TENODESIS, MINI OPEN ROTATOR CUFF TEAR REPAIR;  Surgeon: Meredith Pel, MD;  Location: [facility];  Service: Orthopedics;  Laterality: Left;   Social History   Occupational History  . Not on file  Tobacco Use  . Smoking status: Current Every Day Smoker    Packs/day: 0.50    Years: 57.00    Pack years: 28.50    Types: Cigarettes  . Smokeless tobacco: Never Used  Vaping Use  . Vaping Use: Never used  Substance and Sexual Activity  . Alcohol use: Not Currently    Alcohol/week: 10.0 standard drinks    Types: 10 Glasses of wine per week    Comment: 0-2 beers a day   . Drug use: Not Currently    Types: Flunitrazepam  . Sexual activity: Not on file

## 2020-07-08 ENCOUNTER — Encounter: Payer: No Typology Code available for payment source | Admitting: Rehabilitative and Restorative Service Providers"

## 2020-07-11 ENCOUNTER — Encounter: Payer: No Typology Code available for payment source | Admitting: Physical Therapy

## 2020-07-13 ENCOUNTER — Encounter: Payer: No Typology Code available for payment source | Admitting: Physical Therapy

## 2020-07-14 DIAGNOSIS — Z7901 Long term (current) use of anticoagulants: Secondary | ICD-10-CM | POA: Diagnosis not present

## 2020-07-14 DIAGNOSIS — J449 Chronic obstructive pulmonary disease, unspecified: Secondary | ICD-10-CM | POA: Diagnosis not present

## 2020-07-14 DIAGNOSIS — M1990 Unspecified osteoarthritis, unspecified site: Secondary | ICD-10-CM | POA: Diagnosis not present

## 2020-07-14 DIAGNOSIS — K219 Gastro-esophageal reflux disease without esophagitis: Secondary | ICD-10-CM | POA: Diagnosis not present

## 2020-07-14 DIAGNOSIS — I10 Essential (primary) hypertension: Secondary | ICD-10-CM | POA: Diagnosis not present

## 2020-07-14 DIAGNOSIS — I2510 Atherosclerotic heart disease of native coronary artery without angina pectoris: Secondary | ICD-10-CM | POA: Diagnosis not present

## 2020-07-14 DIAGNOSIS — N1832 Chronic kidney disease, stage 3b: Secondary | ICD-10-CM | POA: Diagnosis not present

## 2020-07-14 DIAGNOSIS — N400 Enlarged prostate without lower urinary tract symptoms: Secondary | ICD-10-CM | POA: Diagnosis not present

## 2020-07-14 DIAGNOSIS — E7800 Pure hypercholesterolemia, unspecified: Secondary | ICD-10-CM | POA: Diagnosis not present

## 2020-07-18 ENCOUNTER — Encounter: Payer: No Typology Code available for payment source | Admitting: Physical Therapy

## 2020-07-20 ENCOUNTER — Encounter: Payer: No Typology Code available for payment source | Admitting: Physical Therapy

## 2020-07-25 ENCOUNTER — Encounter: Payer: No Typology Code available for payment source | Admitting: Physical Therapy

## 2020-07-27 ENCOUNTER — Encounter: Payer: No Typology Code available for payment source | Admitting: Physical Therapy

## 2020-07-28 DIAGNOSIS — C652 Malignant neoplasm of left renal pelvis: Secondary | ICD-10-CM | POA: Diagnosis not present

## 2020-07-28 DIAGNOSIS — R3915 Urgency of urination: Secondary | ICD-10-CM | POA: Diagnosis not present

## 2020-07-28 DIAGNOSIS — R8279 Other abnormal findings on microbiological examination of urine: Secondary | ICD-10-CM | POA: Diagnosis not present

## 2020-07-28 DIAGNOSIS — R972 Elevated prostate specific antigen [PSA]: Secondary | ICD-10-CM | POA: Diagnosis not present

## 2020-07-28 DIAGNOSIS — N401 Enlarged prostate with lower urinary tract symptoms: Secondary | ICD-10-CM | POA: Diagnosis not present

## 2020-08-02 ENCOUNTER — Ambulatory Visit: Payer: No Typology Code available for payment source | Admitting: Occupational Therapy

## 2020-08-05 DIAGNOSIS — J441 Chronic obstructive pulmonary disease with (acute) exacerbation: Secondary | ICD-10-CM | POA: Diagnosis not present

## 2020-08-09 ENCOUNTER — Encounter: Payer: No Typology Code available for payment source | Admitting: Physical Therapy

## 2020-08-12 ENCOUNTER — Ambulatory Visit (INDEPENDENT_AMBULATORY_CARE_PROVIDER_SITE_OTHER): Payer: No Typology Code available for payment source | Admitting: Rehabilitative and Restorative Service Providers"

## 2020-08-12 ENCOUNTER — Other Ambulatory Visit: Payer: Self-pay

## 2020-08-12 ENCOUNTER — Encounter: Payer: Self-pay | Admitting: Rehabilitative and Restorative Service Providers"

## 2020-08-12 DIAGNOSIS — M6281 Muscle weakness (generalized): Secondary | ICD-10-CM | POA: Diagnosis not present

## 2020-08-12 DIAGNOSIS — R293 Abnormal posture: Secondary | ICD-10-CM | POA: Diagnosis not present

## 2020-08-12 DIAGNOSIS — R600 Localized edema: Secondary | ICD-10-CM | POA: Diagnosis not present

## 2020-08-12 DIAGNOSIS — M25612 Stiffness of left shoulder, not elsewhere classified: Secondary | ICD-10-CM | POA: Diagnosis not present

## 2020-08-12 DIAGNOSIS — M25512 Pain in left shoulder: Secondary | ICD-10-CM

## 2020-08-12 DIAGNOSIS — G8929 Other chronic pain: Secondary | ICD-10-CM

## 2020-08-12 NOTE — Therapy (Signed)
[facility] Physical Therapy [address] [place], [place], [address] Phone: [phone]   Fax:  [phone]  Physical Therapy Treatment  Patient Details  Name: Gregory Christensen MRN: [id number] Date of Birth: [birth year] Referring Provider (PT): Marcene Duos MD   Encounter Date: 08/12/2020   PT End of Session - 08/12/20 1826    Visit Number 7    Number of Visits 15    Date for PT Re-Evaluation 08/12/20    Authorization Type applied for extension to 08/12/2020    Authorization Time Period 2x/ week    Authorization - Visit Number 7    Authorization - Number of Visits 15    Progress Note Due on Visit 10    PT Start Time 0931    PT Stop Time 1010    PT Time Calculation (min) 39 min    Activity Tolerance Patient tolerated treatment well;Patient limited by fatigue    Behavior During Therapy [facility] for tasks assessed/performed           Past Medical History:  Diagnosis Date  . Acute deep vein thrombosis (DVT) of left lower extremity ([place])   . Acute hypoxemic respiratory failure ([place])   . Anemia    Iron deficiency  . Anxiety   . Aortic atherosclerosis ([place])   . Arthritis   . Benign prostatic hyperplasia 08/28/2018   UNSPECIFIED WHETHER LOWER URINARY TRACT SYMPTOMS PRESENT  . CAD (coronary artery disease)   . Cancer ([place])   . Chronic back pain   . Chronic hip pain   . Constipation    due to Iron M-W-F  . COPD exacerbation ([place]) 10/18/2017  . Dyspnea   . Emphysema lung ([place])   . Essential hypertension   . GERD (gastroesophageal reflux disease)   . Gout   . Gross hematuria 09/08/2018  . Head injury, acute, with loss of consciousness ([place])    unsure how long he was unresponsive  . History of blood transfusion 05/2019  . History of kidney stones   . History of tobacco abuse   . Hypoxia   . Insomnia   . 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 Pneumonia    in 1980's  . Pre-diabetes   . Pulmonary embolism ([place]) 05/31/2018  . Renal mass    CONCERNING FOR RENAL  CELL CARCINOMA DR. Lovena Neighbours  . Respiratory distress   . Respiratory failure, acute-on-chronic ([place]) 08/03/2018  . Seasonal allergies   . Sleep apnea     Past Surgical History:  Procedure Laterality Date  . COLONOSCOPY WITH PROPOFOL N/A 09/27/2019   Procedure: COLONOSCOPY WITH PROPOFOL;  Surgeon: Irene Shipper, MD;  [facility];  Service: Endoscopy;  Laterality: N/A;  . CYSTOSCOPY/URETEROSCOPY/HOLMIUM LASER/STENT PLACEMENT Left 10/31/2018   Procedure: CYSTOSCOPY/URETEROSCOPY;  Surgeon: Ceasar Mons, MD;  [facility];  Service: Urology;  Laterality: Left;  . ESOPHAGOGASTRODUODENOSCOPY (EGD) WITH PROPOFOL N/A 09/27/2019   Procedure: ESOPHAGOGASTRODUODENOSCOPY (EGD) WITH PROPOFOL;  Surgeon: Irene Shipper, MD;  [facility];  Service: Endoscopy;  Laterality: N/A;  . HERNIA REPAIR Left 1977   inguinal  . HYDROCELE EXCISION Left 05/13/2019   Procedure: HYDROCELECTOMY ADULT;  Surgeon: Ceasar Mons, MD;  [facility];  Service: Urology;  Laterality: Left;  Marland Kitchen MANDIBLE SURGERY  1976   EXTENSIVE  . POLYPECTOMY  09/27/2019   Procedure: POLYPECTOMY;  Surgeon: Irene Shipper, MD;  [facility];  Service: Endoscopy;;  . ROBOT ASSITED LAPAROSCOPIC NEPHROURETERECTOMY Left 10/31/2018   Procedure: XI ROBOT ASSITED  LAPAROSCOPIC NEPHROURETERECTOMY;  Surgeon: Ceasar Mons, MD;  [facility];  Service: Urology;  Laterality: Left;  ONLY NEEDS 240 MIN FOR ALL PROCEDURES  . SHOULDER ARTHROSCOPY WITH ROTATOR CUFF REPAIR Left 04/12/2020   Procedure: LEFT SHOULDER ARTHROSCOPY, DEBRIDEMENT, BICEPS TENODESIS, MINI OPEN ROTATOR CUFF TEAR REPAIR;  Surgeon: Meredith Pel, MD;  Location: [place];  Service: Orthopedics;  Laterality: Left;    There were no vitals filed for this visit.   Subjective Assessment - 08/12/20 1822    Subjective Gregory Christensen is not satisfied with his L shoulder progress while independent with his exercises.  He wants more help  to improve his AROM and strength.    Pertinent History Pt on 2L oxygen via nasal cannula,    Diagnostic tests x-ray    Patient Stated Goals Stop hurting, be able to sleep    Currently in Pain? Yes    Pain Score 4     Pain Location Shoulder    Pain Orientation Left    Pain Descriptors / Indicators Aching;Tightness    Pain Type Chronic pain    Pain Onset More than a month ago    Pain Frequency Intermittent    Aggravating Factors  Reaching with his left shoulder or sleeping on the L side    Effect of Pain on Daily Activities Affects sleep and L UE use    Multiple Pain Sites No              [facility] PT Assessment - 08/12/20 0001      AROM   Left Shoulder Flexion 130 Degrees    Left Shoulder Internal Rotation 30 Degrees    Left Shoulder External Rotation 85 Degrees    Left Shoulder Horizontal ADduction 25 Degrees                         [facility] Adult PT Treatment/Exercise - 08/12/20 0001      Exercises   Exercises Shoulder      Shoulder Exercises: Supine   Protraction Strengthening;Left;20 reps;Weights    Protraction Weight (lbs) 3#    Internal Rotation AROM;Left;10 reps;Other (comment)    Internal Rotation Limitations 10 seconds      Shoulder Exercises: Seated   Retraction Strengthening;Both;10 reps;Other (comment)    Retraction Limitations 5 seconds shoulder blade pinches    External Rotation Strengthening;Both;10 reps;Theraband;Other (comment)    Theraband Level (Shoulder External Rotation) Level 2 (Red)    External Rotation Limitations 2 sets      Manual Therapy   Manual Therapy Passive ROM    Manual therapy comments IR emphasis                  PT Education - 08/12/20 1825    Education Details Reviewed new exercises, added theraband to HEP.  Worked on IR AROM to reduce impingement.    Person(s) Educated Patient    Methods Explanation;Demonstration;Tactile cues;Verbal cues;Handout    Comprehension Verbalized understanding;Returned  demonstration;Verbal cues required;Tactile cues required;Need further instruction            PT Short Term Goals - 08/12/20 1826      PT SHORT TERM GOAL #1   Title Pt will be independent in his initial HEP.    Status Achieved      PT SHORT TERM GOAL #2   Title Pt will be able to improve his Left Shoulder flexion to >/= 140 degrees with pain </= 3/10.    Baseline 140 degrees, pain  at 3/10    Status On-going             PT Long Term Goals - 08/12/20 1826      PT LONG TERM GOAL #1   Title Pt will improve his FOTO score from 41% limitation to </= 31% limitation.    Status On-going      PT LONG TERM GOAL #2   Title Pt will be able to improve his Left shoulder flexion to >/= 160 degrees wtih pain </= 3/10.    Status On-going      PT LONG TERM GOAL #3   Title Pt will improve his L shoulder ER to >/= 70 degrees to improve functional mobility.    Baseline 55 degrees on 06/21/2020    Status On-going      PT LONG TERM GOAL #4   Title Pt will be able to report better sleep improvement of 50%.    Baseline waking up every night between 3 and 4 am    Status On-going                 Plan - 08/12/20 1827    Clinical Impression Statement Gregory Christensen has been independent with his exercises for over a month.  He would like more help as AROM and strength are not where he would like them to be.  His HEP was updated and will be progressed as appropriate next visit.  IR AROM tightness, scapular and rotator cuff weakness were particularly evident.    Personal Factors and Comorbidities Comorbidity 3+    Comorbidities anxiety, GERD, arthritis, COPD, CAD, acute respiratory failure, gout, sleep apnea, head injury with loss of conscious    Examination-Activity Limitations Lift;Dressing;Reach Overhead;Carry    Examination-Participation Restrictions Community Activity;Other    Stability/Clinical Decision Making Stable/Uncomplicated    Rehab Potential Good    PT Frequency 2x / week    PT  Duration 8 weeks    PT Treatment/Interventions ADLs/Self Care Home Management;Cryotherapy;Electrical Stimulation;Ultrasound;Moist Heat;Functional mobility training;Stair training;Therapeutic activities;Therapeutic exercise;Balance training;Neuromuscular re-education;Patient/family education;Passive range of motion;Manual techniques;Taping    PT Next Visit Plan rotator cuff repair protocol, Shoulder ROM, if you use vaso then use with no compression due to [place] [id number]    Consulted and Agree with Plan of Care Patient           Patient will benefit from skilled therapeutic intervention in order to improve the following deficits and impairments:  Pain,Postural dysfunction,Decreased strength,Increased edema,Impaired flexibility,Decreased range of motion  Visit Diagnosis: Abnormal posture  Stiffness of left shoulder, not elsewhere classified  Muscle weakness (generalized)  Localized edema  Chronic pain in left shoulder     Problem List Patient Active Problem List   Diagnosis Date Noted  . Benign neoplasm of transverse colon   . Benign neoplasm of descending colon   . Benign neoplasm of rectum   . Heme positive stool   . Iron deficiency anemia due to chronic blood loss   . Melena   . Abdominal pain, chronic, right lower quadrant   . Chronic anticoagulation   . Symptomatic anemia 09/24/2019  . Pneumonia due to severe acute respiratory syndrome coronavirus 2 (SARS-CoV-2) 02/28/2019  . Renal mass 10/31/2018  . Chest pressure 10/15/2018  . Coronary artery calcification 10/15/2018  . Respiratory failure, acute-on-chronic ([place]) 08/03/2018  . Acute deep vein thrombosis (DVT) of left lower extremity ([place])   . Acute hypoxemic respiratory failure ([place])   . Pulmonary embolism ([place]) 05/31/2018  .  COPD exacerbation ([place]) 10/18/2017  . Hypoxia   . Respiratory distress   . Essential hypertension     Farley Ly PT, MPT 08/12/2020, 6:30 PM  [place], [place], [address] Phone: [phone]   Fax:  [phone]  Name: Gregory Christensen MRN: [id number] Date of Birth: 31-Aug-1950

## 2020-08-12 NOTE — Patient Instructions (Signed)
Access Code: [email or web address] URL: [email or web address] Date: 08/12/2020 Prepared by: Vista Mink  Exercises Supine Scapular Protraction in Flexion with Dumbbells - 2 x daily - 7 x weekly - 2 sets - 20 reps - 3 seconds hold Supine Shoulder Internal Rotation Stretch - 2 x daily - 7 x weekly - 1 sets - 10-20 reps - 10 secondsinutes hold Standing Scapular Retraction - 5 x daily - 7 x weekly - 1 sets - 5 reps - 5 second hold Shoulder External Rotation with Anchored Resistance with Towel Under Elbow - 2 x daily - 3 x weekly - 1-2 sets - 10 reps - 3 hold Standing Shoulder Posterior Capsule Stretch - 3-5 x daily - 7 x weekly - 1 sets - 10 reps - 10 second hold

## 2020-08-16 ENCOUNTER — Other Ambulatory Visit: Payer: Self-pay

## 2020-08-16 ENCOUNTER — Ambulatory Visit (INDEPENDENT_AMBULATORY_CARE_PROVIDER_SITE_OTHER): Payer: No Typology Code available for payment source | Admitting: Physical Therapy

## 2020-08-16 DIAGNOSIS — M6281 Muscle weakness (generalized): Secondary | ICD-10-CM

## 2020-08-16 DIAGNOSIS — M25612 Stiffness of left shoulder, not elsewhere classified: Secondary | ICD-10-CM | POA: Diagnosis not present

## 2020-08-16 DIAGNOSIS — R600 Localized edema: Secondary | ICD-10-CM | POA: Diagnosis not present

## 2020-08-16 DIAGNOSIS — R293 Abnormal posture: Secondary | ICD-10-CM | POA: Diagnosis not present

## 2020-08-16 DIAGNOSIS — M25512 Pain in left shoulder: Secondary | ICD-10-CM

## 2020-08-16 DIAGNOSIS — G8929 Other chronic pain: Secondary | ICD-10-CM

## 2020-08-16 NOTE — Therapy (Signed)
[facility] Physical Therapy [address] [place], [place], [address] Phone: [phone]   Fax:  [phone]  Physical Therapy Treatment  Patient Details  Name: Gregory Christensen MRN: [id number] Date of Birth: 01/21/1951 Referring Provider (PT): Jerry Caras Date: 08/16/2020   PT End of Session - 08/16/20 1338    Visit Number 8    Number of Visits 15    Date for PT Re-Evaluation 10/14/20    Authorization Type [facility]    Authorization Time Period 15 visits from 07/15/2020 to 01/11/2021    Authorization - Visit Number 8    Authorization - Number of Visits 15    Progress Note Due on Visit 10    PT Start Time 1300    PT Stop Time 1343    PT Time Calculation (min) 43 min    Activity Tolerance Patient tolerated treatment well;Patient limited by fatigue    Behavior During Therapy [facility] for tasks assessed/performed           Past Medical History:  Diagnosis Date  . Acute deep vein thrombosis (DVT) of left lower extremity ([place])   . Acute hypoxemic respiratory failure ([place])   . Anemia    Iron deficiency  . Anxiety   . Aortic atherosclerosis ([place])   . Arthritis   . Benign prostatic hyperplasia 08/28/2018   UNSPECIFIED WHETHER LOWER URINARY TRACT SYMPTOMS PRESENT  . CAD (coronary artery disease)   . Cancer ([place])   . Chronic back pain   . Chronic hip pain   . Constipation    due to Iron M-W-F  . COPD exacerbation ([place]) 10/18/2017  . Dyspnea   . Emphysema lung ([place])   . Essential hypertension   . GERD (gastroesophageal reflux disease)   . Gout   . Gross hematuria 09/08/2018  . Head injury, acute, with loss of consciousness ([place])    unsure how long he was unresponsive  . History of blood transfusion 05/2019  . History of kidney stones   . History of tobacco abuse   . Hypoxia   . Insomnia   . 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 Pneumonia    in 1980's  . Pre-diabetes   . Pulmonary embolism ([place]) 05/31/2018  . Renal mass    CONCERNING FOR RENAL CELL CARCINOMA  DR. Lovena Neighbours  . Respiratory distress   . Respiratory failure, acute-on-chronic ([place]) 08/03/2018  . Seasonal allergies   . Sleep apnea     Past Surgical History:  Procedure Laterality Date  . COLONOSCOPY WITH PROPOFOL N/A 09/27/2019   Procedure: COLONOSCOPY WITH PROPOFOL;  Surgeon: Irene Shipper, MD;  [facility];  Service: Endoscopy;  Laterality: N/A;  . CYSTOSCOPY/URETEROSCOPY/HOLMIUM LASER/STENT PLACEMENT Left 10/31/2018   Procedure: CYSTOSCOPY/URETEROSCOPY;  Surgeon: Ceasar Mons, MD;  [facility];  Service: Urology;  Laterality: Left;  . ESOPHAGOGASTRODUODENOSCOPY (EGD) WITH PROPOFOL N/A 09/27/2019   Procedure: ESOPHAGOGASTRODUODENOSCOPY (EGD) WITH PROPOFOL;  Surgeon: Irene Shipper, MD;  [facility];  Service: Endoscopy;  Laterality: N/A;  . HERNIA REPAIR Left 1977   inguinal  . HYDROCELE EXCISION Left 05/13/2019   Procedure: HYDROCELECTOMY ADULT;  Surgeon: Ceasar Mons, MD;  [facility];  Service: Urology;  Laterality: Left;  Marland Kitchen MANDIBLE SURGERY  1976   EXTENSIVE  . POLYPECTOMY  09/27/2019   Procedure: POLYPECTOMY;  Surgeon: Irene Shipper, MD;  [facility];  Service: Endoscopy;;  . ROBOT ASSITED LAPAROSCOPIC NEPHROURETERECTOMY Left 10/31/2018   Procedure: XI ROBOT ASSITED LAPAROSCOPIC NEPHROURETERECTOMY;  Surgeon: Ceasar Mons, MD;  [facility];  Service: Urology;  Laterality: Left;  ONLY NEEDS 240 MIN FOR ALL PROCEDURES  . SHOULDER ARTHROSCOPY WITH ROTATOR CUFF REPAIR Left 04/12/2020   Procedure: LEFT SHOULDER ARTHROSCOPY, DEBRIDEMENT, BICEPS TENODESIS, MINI OPEN ROTATOR CUFF TEAR REPAIR;  Surgeon: Meredith Pel, MD;  Location: [place];  Service: Orthopedics;  Laterality: Left;    There were no vitals filed for this visit.       [facility] PT Assessment - 08/16/20 0001      Assessment   Medical Diagnosis L rotator cuff repair    Referring Provider (PT) Dean    Onset Date/Surgical Date 04/12/20     Hand Dominance Right      AROM   Left Shoulder Extension 55 Degrees    Left Shoulder Flexion 150 Degrees    Left Shoulder ABduction 145 Degrees    Left Shoulder Internal Rotation --   thumb to T12   Left Shoulder External Rotation 70 Degrees    Left Shoulder Horizontal ADduction 35 Degrees                         [facility] Adult PT Treatment/Exercise - 08/16/20 0001      Exercises   Exercises Shoulder      Shoulder Exercises: Seated   Extension Strengthening;20 reps    Theraband Level (Shoulder Extension) Level 2 (Red)    Row Strengthening;20 reps;Theraband    Theraband Level (Shoulder Row) Level 2 (Red)    External Rotation Strengthening;20 reps    Theraband Level (Shoulder External Rotation) Level 2 (Red)    Other Seated Exercises chest press with 3# bar x 20      Shoulder Exercises: Pulleys   Flexion 2 minutes    Scaption 2 minutes      Manual Therapy   Manual Therapy Passive ROM    Passive ROM flexion/ abduction/ ER                    PT Short Term Goals - 08/12/20 1826      PT SHORT TERM GOAL #1   Title Pt will be independent in his initial HEP.    Status Achieved      PT SHORT TERM GOAL #2   Title Pt will be able to improve his Left Shoulder flexion to >/= 140 degrees with pain </= 3/10.    Baseline 140 degrees, pain at 3/10    Status On-going             PT Long Term Goals - 08/16/20 1437      PT LONG TERM GOAL #1   Title Pt will improve his FOTO score from 41% limitation to </= 31% limitation.    Status On-going    Target Date 10/14/20      PT LONG TERM GOAL #2   Title Pt will be able to improve his Left shoulder flexion to >/= 160 degrees wtih pain </= 3/10.    Baseline 150 degrees on 08/16/2020    Time 8    Period Weeks    Status On-going      PT LONG TERM GOAL #3   Title Pt will improve his L shoulder ER to >/= 70 degrees to improve functional mobility.    Baseline 70 degrees on 08/16/2020    Time 8    Period Weeks     Status Achieved    Target Date 10/14/20  PT LONG TERM GOAL #4   Title Pt will be able to report better sleep improvement of 50%.    Status On-going    Target Date 10/14/20                 Plan - 08/16/20 1651    Clinical Impression Statement Pt has been making progress with L shoulder ROM. Pt still with pain noted at end ranges. Pt with weakness noted in left shoulder and scapular region. I am continued PT to progress toward pt's LTG's.    Personal Factors and Comorbidities Comorbidity 3+    Comorbidities anxiety, GERD, arthritis, COPD, CAD, acute respiratory failure, gout, sleep apnea, head injury with loss of conscious    Examination-Activity Limitations Lift;Dressing;Reach Overhead;Carry    Examination-Participation Restrictions Community Activity;Other    Stability/Clinical Decision Making Stable/Uncomplicated    Rehab Potential Good    PT Frequency 2x / week    PT Duration 8 weeks    PT Treatment/Interventions ADLs/Self Care Home Management;Cryotherapy;Electrical Stimulation;Ultrasound;Moist Heat;Functional mobility training;Stair training;Therapeutic activities;Therapeutic exercise;Balance training;Neuromuscular re-education;Patient/family education;Passive range of motion;Manual techniques;Taping    PT Next Visit Plan rotator cuff repair protocol, Shoulder ROM, if you use vaso then use with no compression due to [place] [id number]    Consulted and Agree with Plan of Care Patient           Patient will benefit from skilled therapeutic intervention in order to improve the following deficits and impairments:  Pain,Postural dysfunction,Decreased strength,Increased edema,Impaired flexibility,Decreased range of motion  Visit Diagnosis: Abnormal posture  Stiffness of left shoulder, not elsewhere classified  Muscle weakness (generalized)  Localized edema  Chronic pain in left shoulder  Acute pain of left shoulder     Problem List Patient  Active Problem List   Diagnosis Date Noted  . Benign neoplasm of transverse colon   . Benign neoplasm of descending colon   . Benign neoplasm of rectum   . Heme positive stool   . Iron deficiency anemia due to chronic blood loss   . Melena   . Abdominal pain, chronic, right lower quadrant   . Chronic anticoagulation   . Symptomatic anemia 09/24/2019  . Pneumonia due to severe acute respiratory syndrome coronavirus 2 (SARS-CoV-2) 02/28/2019  . Renal mass 10/31/2018  . Chest pressure 10/15/2018  . Coronary artery calcification 10/15/2018  . Respiratory failure, acute-on-chronic ([place]) 08/03/2018  . Acute deep vein thrombosis (DVT) of left lower extremity ([place])   . Acute hypoxemic respiratory failure ([place])   . Pulmonary embolism ([place]) 05/31/2018  . COPD exacerbation ([place]) 10/18/2017  . Hypoxia   . Respiratory distress   . Essential hypertension     Oretha Caprice, PT, MPT 08/16/2020, 5:00 PM  [facility] Physical Therapy [address] [place], [place], [address] Phone: [phone]   Fax:  [phone]  Name: Gregory Christensen MRN: [id number] Date of Birth: 21-Jan-1951

## 2020-08-18 ENCOUNTER — Ambulatory Visit (INDEPENDENT_AMBULATORY_CARE_PROVIDER_SITE_OTHER): Payer: No Typology Code available for payment source | Admitting: Rehabilitative and Restorative Service Providers"

## 2020-08-18 ENCOUNTER — Encounter: Payer: Self-pay | Admitting: Rehabilitative and Restorative Service Providers"

## 2020-08-18 ENCOUNTER — Other Ambulatory Visit: Payer: Self-pay

## 2020-08-18 DIAGNOSIS — R293 Abnormal posture: Secondary | ICD-10-CM

## 2020-08-18 DIAGNOSIS — R600 Localized edema: Secondary | ICD-10-CM

## 2020-08-18 DIAGNOSIS — M6281 Muscle weakness (generalized): Secondary | ICD-10-CM | POA: Diagnosis not present

## 2020-08-18 DIAGNOSIS — G8929 Other chronic pain: Secondary | ICD-10-CM

## 2020-08-18 DIAGNOSIS — M25612 Stiffness of left shoulder, not elsewhere classified: Secondary | ICD-10-CM

## 2020-08-18 DIAGNOSIS — M25512 Pain in left shoulder: Secondary | ICD-10-CM

## 2020-08-18 NOTE — Therapy (Signed)
[facility] [address] [place], [place], [address] Phone: [phone]   Fax:  [phone]  Physical Therapy Treatment  Patient Details  Name: Gregory Christensen MRN: [id number] Date of Birth: 08-Feb-1951 Referring Provider (PT): Jerry Caras Date: 08/18/2020   PT End of Session - 08/18/20 1013    Visit Number 9    Number of Visits 15    Date for PT Re-Evaluation 10/14/20    Authorization Type applied for extension to 10/14/2020    Authorization Time Period 15 visits from 07/15/2020 to 01/11/2021    Authorization - Visit Number 9    Authorization - Number of Visits 15    Progress Note Due on Visit 10    PT Start Time 0928    PT Stop Time 1007    PT Time Calculation (min) 39 min    Activity Tolerance Patient tolerated treatment well;Patient limited by fatigue    Behavior During Therapy [facility] for tasks assessed/performed           Past Medical History:  Diagnosis Date  . Acute deep vein thrombosis (DVT) of left lower extremity ([place])   . Acute hypoxemic respiratory failure ([place])   . Anemia    Iron deficiency  . Anxiety   . Aortic atherosclerosis ([place])   . Arthritis   . Benign prostatic hyperplasia 08/28/2018   UNSPECIFIED WHETHER LOWER URINARY TRACT SYMPTOMS PRESENT  . CAD (coronary artery disease)   . Cancer ([place])   . Chronic back pain   . Chronic hip pain   . Constipation    due to Iron M-W-F  . COPD exacerbation ([place]) 10/18/2017  . Dyspnea   . Emphysema lung ([place])   . Essential hypertension   . GERD (gastroesophageal reflux disease)   . Gout   . Gross hematuria 09/08/2018  . Head injury, acute, with loss of consciousness ([place])    unsure how long he was unresponsive  . History of blood transfusion 05/2019  . History of kidney stones   . History of tobacco abuse   . Hypoxia   . Insomnia   . 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 Pneumonia    in 1980's  . Pre-diabetes   . Pulmonary embolism ([place]) 05/31/2018  . Renal mass     CONCERNING FOR RENAL CELL CARCINOMA DR. Lovena Neighbours  . Respiratory distress   . Respiratory failure, acute-on-chronic ([place]) 08/03/2018  . Seasonal allergies   . Sleep apnea     Past Surgical History:  Procedure Laterality Date  . COLONOSCOPY WITH PROPOFOL N/A 09/27/2019   Procedure: COLONOSCOPY WITH PROPOFOL;  Surgeon: Irene Shipper, MD;  [facility];  Service: Endoscopy;  Laterality: N/A;  . CYSTOSCOPY/URETEROSCOPY/HOLMIUM LASER/STENT PLACEMENT Left 10/31/2018   Procedure: CYSTOSCOPY/URETEROSCOPY;  Surgeon: Ceasar Mons, MD;  [facility];  Service: Urology;  Laterality: Left;  . ESOPHAGOGASTRODUODENOSCOPY (EGD) WITH PROPOFOL N/A 09/27/2019   Procedure: ESOPHAGOGASTRODUODENOSCOPY (EGD) WITH PROPOFOL;  Surgeon: Irene Shipper, MD;  [facility];  Service: Endoscopy;  Laterality: N/A;  . HERNIA REPAIR Left 1977   inguinal  . HYDROCELE EXCISION Left 05/13/2019   Procedure: HYDROCELECTOMY ADULT;  Surgeon: Ceasar Mons, MD;  [facility];  Service: Urology;  Laterality: Left;  Marland Kitchen MANDIBLE SURGERY  1976   EXTENSIVE  . POLYPECTOMY  09/27/2019   Procedure: POLYPECTOMY;  Surgeon: Irene Shipper, MD;  [facility];  Service: Endoscopy;;  . ROBOT ASSITED LAPAROSCOPIC NEPHROURETERECTOMY Left 10/31/2018   Procedure: XI  ROBOT ASSITED LAPAROSCOPIC NEPHROURETERECTOMY;  Surgeon: Ceasar Mons, MD;  [facility];  Service: Urology;  Laterality: Left;  ONLY NEEDS 240 MIN FOR ALL PROCEDURES  . SHOULDER ARTHROSCOPY WITH ROTATOR CUFF REPAIR Left 04/12/2020   Procedure: LEFT SHOULDER ARTHROSCOPY, DEBRIDEMENT, BICEPS TENODESIS, MINI OPEN ROTATOR CUFF TEAR REPAIR;  Surgeon: Meredith Pel, MD;  Location: [facility];  Service: Orthopedics;  Laterality: Left;    There were no vitals filed for this visit.   Subjective Assessment - 08/18/20 0953    Subjective Gregory Christensen notes difficulty sleeping due to L shoulder pain.  He reports good HEP compliance.     Pertinent History Pt on 2L oxygen via nasal cannula,    Diagnostic tests x-ray    Patient Stated Goals Stop hurting, be able to sleep    Currently in Pain? Yes    Pain Score 7     Pain Location Shoulder    Pain Orientation Left    Pain Descriptors / Indicators Aching;Tightness    Pain Type Chronic pain    Pain Radiating Towards Shoulder to elbow    Pain Onset More than a month ago    Pain Frequency Constant    Aggravating Factors  Reaching with the L arm or sleeping    Pain Relieving Factors Heat, biofreeze, pain meds    Effect of Pain on Daily Activities Affects sleep and L UE use    Multiple Pain Sites No              [facility] PT Assessment - 08/18/20 0001      AROM   Left Shoulder Internal Rotation 40 Degrees    Left Shoulder External Rotation 90 Degrees                         [facility] Adult PT Treatment/Exercise - 08/18/20 0001      Exercises   Exercises Shoulder      Shoulder Exercises: Supine   Protraction Strengthening;Left;20 reps;Weights    Protraction Weight (lbs) 3#    Internal Rotation AROM;Left;10 reps;Other (comment)    Internal Rotation Limitations 10 seconds      Shoulder Exercises: Seated   Retraction Strengthening;Both;10 reps;Other (comment)    Retraction Limitations 5 seconds shoulder blade pinches    External Rotation Strengthening;Both;10 reps;Theraband;Other (comment)    Theraband Level (Shoulder External Rotation) Level 2 (Red)    External Rotation Limitations 2 sets    Other Seated Exercises Posterior capsule stretch 10X 10 seconds      Shoulder Exercises: Pulleys   Flexion 2 minutes    Scaption 2 minutes      Manual Therapy   Manual Therapy Passive ROM    Manual therapy comments IR emphasis                  PT Education - 08/18/20 1012    Education Details Reviewed HEP.  Encouraged pulleys at home in addition to current program.    Person(s) Educated Patient    Methods Explanation;Demonstration;Tactile  cues;Verbal cues    Comprehension Returned demonstration;Need further instruction;Verbalized understanding;Verbal cues required;Tactile cues required            PT Short Term Goals - 08/18/20 1013      PT SHORT TERM GOAL #1   Title Pt will be independent in his initial HEP.    Status Achieved      PT SHORT TERM GOAL #2   Title Pt will be able to improve his  Left Shoulder flexion to >/= 140 degrees with pain </= 3/10.    Baseline 140 degrees, pain at 3/10    Status On-going             PT Long Term Goals - 08/18/20 1013      PT LONG TERM GOAL #1   Title Pt will improve his FOTO score from 41% limitation to </= 31% limitation.    Status On-going      PT LONG TERM GOAL #2   Title Pt will be able to improve his Left shoulder flexion to >/= 160 degrees wtih pain </= 3/10.    Status On-going      PT LONG TERM GOAL #3   Title Pt will improve his L shoulder ER to >/= 70 degrees to improve functional mobility.    Baseline 55 degrees on 06/21/2020    Status Achieved      PT LONG TERM GOAL #4   Title Pt will be able to report better sleep improvement of 50%.    Baseline waking up every night between 3 and 4 am    Status On-going                 Plan - 08/18/20 1647    Clinical Impression Statement Gregory Christensen is making objective AROM progress since starting back to physical therapy.  Although still limited, IR/ER AROM improved 10/5 degrees, respectively.  Gregory Christensen will benefit from continued skilled care to address significant AROM, strength and functional impairments of his L shoulder.    Personal Factors and Comorbidities Comorbidity 3+    Comorbidities anxiety, GERD, arthritis, COPD, CAD, acute respiratory failure, gout, sleep apnea, head injury with loss of conscious    Examination-Activity Limitations Lift;Dressing;Reach Overhead;Carry    Examination-Participation Restrictions Community Activity;Other    Stability/Clinical Decision Making Stable/Uncomplicated    Rehab  Potential Good    PT Frequency 2x / week    PT Duration 8 weeks    PT Treatment/Interventions ADLs/Self Care Home Management;Cryotherapy;Electrical Stimulation;Ultrasound;Moist Heat;Functional mobility training;Stair training;Therapeutic activities;Therapeutic exercise;Balance training;Neuromuscular re-education;Patient/family education;Passive range of motion;Manual techniques;Taping    PT Next Visit Plan rotator cuff repair protocol, Shoulder ROM, if you use vaso then use with no compression due to [place] [id number]    Consulted and Agree with Plan of Care Patient           Patient will benefit from skilled therapeutic intervention in order to improve the following deficits and impairments:  Pain,Postural dysfunction,Decreased strength,Increased edema,Impaired flexibility,Decreased range of motion  Visit Diagnosis: Abnormal posture  Stiffness of left shoulder, not elsewhere classified  Muscle weakness (generalized)  Localized edema  Chronic pain in left shoulder     Problem List Patient Active Problem List   Diagnosis Date Noted  . Benign neoplasm of transverse colon   . Benign neoplasm of descending colon   . Benign neoplasm of rectum   . Heme positive stool   . Iron deficiency anemia due to chronic blood loss   . Melena   . Abdominal pain, chronic, right lower quadrant   . Chronic anticoagulation   . Symptomatic anemia 09/24/2019  . Pneumonia due to severe acute respiratory syndrome coronavirus 2 (SARS-CoV-2) 02/28/2019  . Renal mass 10/31/2018  . Chest pressure 10/15/2018  . Coronary artery calcification 10/15/2018  . Respiratory failure, acute-on-chronic ([place]) 08/03/2018  . Acute deep vein thrombosis (DVT) of left lower extremity ([place])   . Acute hypoxemic respiratory failure ([place])   .  Pulmonary embolism ([place]) 05/31/2018  . COPD exacerbation ([place]) 10/18/2017  . Hypoxia   . Respiratory distress   . Essential hypertension     Farley Ly  PT, MPT 08/18/2020, 4:50 PM  [facility] [address] [place], [place], [address] Phone: [phone]   Fax:  [phone]  Name: Gregory Christensen MRN: [id number] Date of Birth: 1950-11-28

## 2020-08-23 ENCOUNTER — Other Ambulatory Visit: Payer: Self-pay

## 2020-08-23 ENCOUNTER — Ambulatory Visit (INDEPENDENT_AMBULATORY_CARE_PROVIDER_SITE_OTHER): Payer: No Typology Code available for payment source | Admitting: Physical Therapy

## 2020-08-23 DIAGNOSIS — M6281 Muscle weakness (generalized): Secondary | ICD-10-CM | POA: Diagnosis not present

## 2020-08-23 DIAGNOSIS — M25612 Stiffness of left shoulder, not elsewhere classified: Secondary | ICD-10-CM | POA: Diagnosis not present

## 2020-08-23 DIAGNOSIS — R600 Localized edema: Secondary | ICD-10-CM

## 2020-08-23 DIAGNOSIS — M25512 Pain in left shoulder: Secondary | ICD-10-CM

## 2020-08-23 DIAGNOSIS — R293 Abnormal posture: Secondary | ICD-10-CM

## 2020-08-23 DIAGNOSIS — G8929 Other chronic pain: Secondary | ICD-10-CM

## 2020-08-23 NOTE — Therapy (Signed)
[facility] [address] [place], [place], [address] Phone: [phone]   Fax:  [phone]  Physical Therapy Treatment Progress Note  Patient Details  Name: Gregory Christensen MRN: [id number] Date of Birth: [birth year] Referring Provider (PT): Dean 1012/2021   Encounter Date: 08/23/2020   PT End of Session - 08/23/20 1056    Visit Number 10    Number of Visits 15    Authorization Type applied for extension to 10/14/2020    Authorization Time Period 15 visits from 07/15/2020 to 01/11/2021    Authorization - Visit Number 10    Authorization - Number of Visits 15    Progress Note Due on Visit 20    PT Start Time 1016    PT Stop Time 1058    PT Time Calculation (min) 42 min    Activity Tolerance Patient tolerated treatment well;Patient limited by fatigue    Behavior During Therapy [facility] for tasks assessed/performed           Past Medical History:  Diagnosis Date  . Acute deep vein thrombosis (DVT) of left lower extremity ([place])   . Acute hypoxemic respiratory failure ([place])   . Anemia    Iron deficiency  . Anxiety   . Aortic atherosclerosis ([place])   . Arthritis   . Benign prostatic hyperplasia 08/28/2018   UNSPECIFIED WHETHER LOWER URINARY TRACT SYMPTOMS PRESENT  . CAD (coronary artery disease)   . Cancer ([place])   . Chronic back pain   . Chronic hip pain   . Constipation    due to Iron M-W-F  . COPD exacerbation ([place]) 10/18/2017  . Dyspnea   . Emphysema lung ([place])   . Essential hypertension   . GERD (gastroesophageal reflux disease)   . Gout   . Gross hematuria 09/08/2018  . Head injury, acute, with loss of consciousness ([place])    unsure how long he was unresponsive  . History of blood transfusion 05/2019  . History of kidney stones   . History of tobacco abuse   . Hypoxia   . Insomnia   . 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 Pneumonia    in 1980's  . Pre-diabetes   . Pulmonary embolism ([place]) 05/31/2018  . Renal mass    CONCERNING FOR  RENAL CELL CARCINOMA DR. Lovena Neighbours  . Respiratory distress   . Respiratory failure, acute-on-chronic ([place]) 08/03/2018  . Seasonal allergies   . Sleep apnea     Past Surgical History:  Procedure Laterality Date  . COLONOSCOPY WITH PROPOFOL N/A 09/27/2019   Procedure: COLONOSCOPY WITH PROPOFOL;  Surgeon: Irene Shipper, MD;  [facility];  Service: Endoscopy;  Laterality: N/A;  . CYSTOSCOPY/URETEROSCOPY/HOLMIUM LASER/STENT PLACEMENT Left 10/31/2018   Procedure: CYSTOSCOPY/URETEROSCOPY;  Surgeon: Ceasar Mons, MD;  [facility];  Service: Urology;  Laterality: Left;  . ESOPHAGOGASTRODUODENOSCOPY (EGD) WITH PROPOFOL N/A 09/27/2019   Procedure: ESOPHAGOGASTRODUODENOSCOPY (EGD) WITH PROPOFOL;  Surgeon: Irene Shipper, MD;  [facility];  Service: Endoscopy;  Laterality: N/A;  . HERNIA REPAIR Left 1977   inguinal  . HYDROCELE EXCISION Left 05/13/2019   Procedure: HYDROCELECTOMY ADULT;  Surgeon: Ceasar Mons, MD;  [facility];  Service: Urology;  Laterality: Left;  Marland Kitchen MANDIBLE SURGERY  1976   EXTENSIVE  . POLYPECTOMY  09/27/2019   Procedure: POLYPECTOMY;  Surgeon: Irene Shipper, MD;  [facility];  Service: Endoscopy;;  . ROBOT ASSITED LAPAROSCOPIC NEPHROURETERECTOMY Left 10/31/2018   Procedure: XI ROBOT ASSITED LAPAROSCOPIC NEPHROURETERECTOMY;  Surgeon: Ceasar Mons, MD;  [facility];  Service: Urology;  Laterality: Left;  ONLY NEEDS 240 MIN FOR ALL PROCEDURES  . SHOULDER ARTHROSCOPY WITH ROTATOR CUFF REPAIR Left 04/12/2020   Procedure: LEFT SHOULDER ARTHROSCOPY, DEBRIDEMENT, BICEPS TENODESIS, MINI OPEN ROTATOR CUFF TEAR REPAIR;  Surgeon: Meredith Pel, MD;  Location: [facility];  Service: Orthopedics;  Laterality: Left;    There were no vitals filed for this visit.   Subjective Assessment - 08/23/20 1027    Subjective Pt arriving to therapy reporting L shoulder pain of 5/10.    Pertinent History Pt on 2L oxygen via nasal  cannula,    Diagnostic tests x-ray    Patient Stated Goals Stop hurting, be able to sleep    Currently in Pain? Yes    Pain Score 5     Pain Location Shoulder    Pain Orientation Left    Pain Descriptors / Indicators Aching    Pain Type Chronic pain    Pain Onset More than a month ago    Pain Frequency Intermittent              [facility] PT Assessment - 08/23/20 0001      Assessment   Medical Diagnosis L rotator cuff repair    Referring Provider (PT) Marlou Sa 1012/2021    Hand Dominance Right      AROM   Left Shoulder Internal Rotation 55 Degrees   in supine elbow 90 degrees, shoulder 90 degrees abduction: 55 degrees actively, 65 degrees passively   Left Shoulder External Rotation 65 Degrees   sitting elbow 30 degrees abducted and elbow at 90 degrees flexion: 65 degrees, PROM: supine and sitting: 90 degrees                        [facility] Adult PT Treatment/Exercise - 08/23/20 0001      Exercises   Exercises Shoulder      Shoulder Exercises: Supine   Protraction Strengthening    Protraction Weight (lbs) 3#      Shoulder Exercises: Seated   Extension Strengthening;Both;15 reps;Theraband    Theraband Level (Shoulder Extension) Level 3 (Green)    Extension Limitations 2 sets    Row Strengthening;Both;15 reps;Theraband    Theraband Level (Shoulder Row) Level 3 (Green)    Row Limitations 2 sets    External Rotation Strengthening;Both;10 reps;Theraband;Other (comment)    Theraband Level (Shoulder External Rotation) Level 3 (Green)    External Rotation Limitations 1 set with red and one set with green bands    Other Seated Exercises Posterior capsule stretch 10X 10 seconds      Shoulder Exercises: Standing   Other Standing Exercises rolling 1# weighted ball side to side with shoulder flexed to 90 degrees x 15 2 sets      Shoulder Exercises: ROM/Strengthening   UBE (Upper Arm Bike) L1 4 minutes, alternating front and back  every minute      Manual Therapy   Manual  Therapy Passive ROM    Manual therapy comments concentration on IR and ER.                    PT Short Term Goals - 08/23/20 1058      PT SHORT TERM GOAL #1   Title Pt will be independent in his initial HEP.    Status Achieved      PT SHORT TERM GOAL #2   Title Pt will be able to improve  his Left Shoulder flexion to >/= 140 degrees with pain </= 3/10.    Status Achieved             PT Long Term Goals - 08/23/20 1058      PT LONG TERM GOAL #1   Title Pt will improve his FOTO score from 41% limitation to </= 31% limitation.    Status On-going      PT LONG TERM GOAL #2   Title Pt will be able to improve his Left shoulder flexion to >/= 160 degrees wtih pain </= 3/10.    Baseline 150 degrees on 08/16/2020    Status On-going      PT LONG TERM GOAL #3   Title Pt will improve his L shoulder ER to >/= 70 degrees to improve functional mobility.    Baseline 65 AROM, 90 PROM    Status On-going      PT LONG TERM GOAL #4   Title Pt will be able to report better sleep improvement of 50%.    Baseline waking up every night between 3 and 4 am    Status On-going                 Plan - 08/23/20 1051    Clinical Impression Statement Mr. Delahoussaye making progress with AROM and PROM (see flowsheets) still progressing with strength gains. Pt requiring seated rest breaks thoughout session for SOB. O2saturation rates ranged from 98-100% on 2 L O2 with nasal cannula with sitting activities, Pt's O2 levels dropped to 88% with standing activities but returned to 98% after 6 pursed lip deep breaths. Continue skilled PT progressing toward LTG's.    Personal Factors and Comorbidities Comorbidity 3+    Comorbidities anxiety, GERD, arthritis, COPD, CAD, acute respiratory failure, gout, sleep apnea, head injury with loss of conscious    Examination-Activity Limitations Lift;Dressing;Reach Overhead;Carry    Examination-Participation Restrictions Community Activity;Other    Rehab Potential  Good    PT Frequency 2x / week    PT Duration 8 weeks    PT Treatment/Interventions ADLs/Self Care Home Management;Cryotherapy;Electrical Stimulation;Ultrasound;Moist Heat;Functional mobility training;Stair training;Therapeutic activities;Therapeutic exercise;Balance training;Neuromuscular re-education;Patient/family education;Passive range of motion;Manual techniques;Taping    PT Next Visit Plan rotator cuff repair protocol, Shoulder ROM, if you use vaso then use with no compression due to [place] [id number]    Consulted and Agree with Plan of Care Patient           Patient will benefit from skilled therapeutic intervention in order to improve the following deficits and impairments:  Pain,Postural dysfunction,Decreased strength,Increased edema,Impaired flexibility,Decreased range of motion  Visit Diagnosis: Abnormal posture  Stiffness of left shoulder, not elsewhere classified  Muscle weakness (generalized)  Localized edema  Acute pain of left shoulder  Chronic pain in left shoulder     Problem List Patient Active Problem List   Diagnosis Date Noted  . Benign neoplasm of transverse colon   . Benign neoplasm of descending colon   . Benign neoplasm of rectum   . Heme positive stool   . Iron deficiency anemia due to chronic blood loss   . Melena   . Abdominal pain, chronic, right lower quadrant   . Chronic anticoagulation   . Symptomatic anemia 09/24/2019  . Pneumonia due to severe acute respiratory syndrome coronavirus 2 (SARS-CoV-2) 02/28/2019  . Renal mass 10/31/2018  . Chest pressure 10/15/2018  . Coronary artery calcification 10/15/2018  . Respiratory failure, acute-on-chronic ([place]) 08/03/2018  .  Acute deep vein thrombosis (DVT) of left lower extremity ([place])   . Acute hypoxemic respiratory failure ([place])   . Pulmonary embolism ([place]) 05/31/2018  . COPD exacerbation ([place]) 10/18/2017  . Hypoxia   . Respiratory distress   . Essential hypertension      Oretha Caprice, PT, MPT 08/23/2020, 11:01 AM  [facility] [address] [place], [place], [address] Phone: [phone]   Fax:  [phone]  Name: Gregory Christensen MRN: [id number] Date of Birth: [birth year]

## 2020-08-25 ENCOUNTER — Other Ambulatory Visit: Payer: Self-pay

## 2020-08-25 ENCOUNTER — Ambulatory Visit (INDEPENDENT_AMBULATORY_CARE_PROVIDER_SITE_OTHER): Payer: No Typology Code available for payment source | Admitting: Rehabilitative and Restorative Service Providers"

## 2020-08-25 ENCOUNTER — Encounter: Payer: Self-pay | Admitting: Rehabilitative and Restorative Service Providers"

## 2020-08-25 DIAGNOSIS — M25512 Pain in left shoulder: Secondary | ICD-10-CM

## 2020-08-25 DIAGNOSIS — M6281 Muscle weakness (generalized): Secondary | ICD-10-CM

## 2020-08-25 DIAGNOSIS — M25612 Stiffness of left shoulder, not elsewhere classified: Secondary | ICD-10-CM

## 2020-08-25 DIAGNOSIS — G8929 Other chronic pain: Secondary | ICD-10-CM

## 2020-08-25 DIAGNOSIS — R293 Abnormal posture: Secondary | ICD-10-CM | POA: Diagnosis not present

## 2020-08-25 DIAGNOSIS — R600 Localized edema: Secondary | ICD-10-CM

## 2020-08-25 NOTE — Therapy (Signed)
[facility] Physical Therapy [address] [place], [place], [address] Phone: [phone]   Fax:  [phone]  Physical Therapy Treatment/Reassessment  Patient Details  Name: Gregory Christensen MRN: [id number] Date of Birth: [birth year] Referring Provider (PT): Dean 1012/2021   Encounter Date: 08/25/2020   PT End of Session - 08/25/20 1135    Visit Number 11    Number of Visits 15    Date for PT Re-Evaluation 10/14/20    Authorization Type applied for extension to 10/14/2020    Authorization Time Period 15 visits from 07/15/2020 to 01/11/2021    Authorization - Visit Number 11    Authorization - Number of Visits 15    Progress Note Due on Visit 20    PT Start Time 0930    PT Stop Time 1015    PT Time Calculation (min) 45 min    Activity Tolerance Patient tolerated treatment well;No increased pain    Behavior During Therapy WFL for tasks assessed/performed           Past Medical History:  Diagnosis Date  . Acute deep vein thrombosis (DVT) of left lower extremity ([place])   . Acute hypoxemic respiratory failure ([place])   . Anemia    Iron deficiency  . Anxiety   . Aortic atherosclerosis ([place])   . Arthritis   . Benign prostatic hyperplasia 08/28/2018   UNSPECIFIED WHETHER LOWER URINARY TRACT SYMPTOMS PRESENT  . CAD (coronary artery disease)   . Cancer ([place])   . Chronic back pain   . Chronic hip pain   . Constipation    due to Iron M-W-F  . COPD exacerbation ([place]) 10/18/2017  . Dyspnea   . Emphysema lung ([place])   . Essential hypertension   . GERD (gastroesophageal reflux disease)   . Gout   . Gross hematuria 09/08/2018  . Head injury, acute, with loss of consciousness ([place])    unsure how long he was unresponsive  . History of blood transfusion 05/2019  . History of kidney stones   . History of tobacco abuse   . Hypoxia   . Insomnia   . 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 Pneumonia    in 1980's  . Pre-diabetes   . Pulmonary embolism ([place]) 05/31/2018  .  Renal mass    CONCERNING FOR RENAL CELL CARCINOMA DR. Lovena Neighbours  . Respiratory distress   . Respiratory failure, acute-on-chronic ([place]) 08/03/2018  . Seasonal allergies   . Sleep apnea     Past Surgical History:  Procedure Laterality Date  . COLONOSCOPY WITH PROPOFOL N/A 09/27/2019   Procedure: COLONOSCOPY WITH PROPOFOL;  Surgeon: Irene Shipper, MD;  [facility];  Service: Endoscopy;  Laterality: N/A;  . CYSTOSCOPY/URETEROSCOPY/HOLMIUM LASER/STENT PLACEMENT Left 10/31/2018   Procedure: CYSTOSCOPY/URETEROSCOPY;  Surgeon: Ceasar Mons, MD;  [facility];  Service: Urology;  Laterality: Left;  . ESOPHAGOGASTRODUODENOSCOPY (EGD) WITH PROPOFOL N/A 09/27/2019   Procedure: ESOPHAGOGASTRODUODENOSCOPY (EGD) WITH PROPOFOL;  Surgeon: Irene Shipper, MD;  [facility];  Service: Endoscopy;  Laterality: N/A;  . HERNIA REPAIR Left 1977   inguinal  . HYDROCELE EXCISION Left 05/13/2019   Procedure: HYDROCELECTOMY ADULT;  Surgeon: Ceasar Mons, MD;  [facility];  Service: Urology;  Laterality: Left;  Marland Kitchen MANDIBLE SURGERY  1976   EXTENSIVE  . POLYPECTOMY  09/27/2019   Procedure: POLYPECTOMY;  Surgeon: Irene Shipper, MD;  [facility];  Service: Endoscopy;;  . ROBOT ASSITED LAPAROSCOPIC NEPHROURETERECTOMY Left 10/31/2018   Procedure: XI  ROBOT ASSITED LAPAROSCOPIC NEPHROURETERECTOMY;  Surgeon: Ceasar Mons, MD;  [facility];  Service: Urology;  Laterality: Left;  ONLY NEEDS 240 MIN FOR ALL PROCEDURES  . SHOULDER ARTHROSCOPY WITH ROTATOR CUFF REPAIR Left 04/12/2020   Procedure: LEFT SHOULDER ARTHROSCOPY, DEBRIDEMENT, BICEPS TENODESIS, MINI OPEN ROTATOR CUFF TEAR REPAIR;  Surgeon: Meredith Pel, MD;  Location: [facility];  Service: Orthopedics;  Laterality: Left;    There were no vitals filed for this visit.   Subjective Assessment - 08/25/20 1127    Subjective Santez reports much better sleep the past few days.  Sleeping on the L is  tender and he is not able to stay on the L side as long as he would like.    Pertinent History Pt on 2L oxygen via nasal cannula,    Diagnostic tests x-ray    Patient Stated Goals Continue getting stronger and sleep better on the L side.    Currently in Pain? Yes    Pain Score 3     Pain Location Shoulder    Pain Orientation Left    Pain Descriptors / Indicators Aching;Sore    Pain Type Surgical pain;Chronic pain    Pain Radiating Towards NA    Pain Onset More than a month ago    Pain Frequency Intermittent    Aggravating Factors  Sleeping on the L and end range "pinching"    Pain Relieving Factors Exercise    Effect of Pain on Daily Activities See aggravating factors    Multiple Pain Sites No              [facility] PT Assessment - 08/25/20 0001      ROM / Strength   AROM / PROM / Strength AROM;Strength      AROM   Overall AROM  Deficits    AROM Assessment Site Shoulder    Right/Left Shoulder Left;Right    Right Shoulder Flexion 145 Degrees    Right Shoulder Internal Rotation 40 Degrees    Right Shoulder External Rotation 105 Degrees    Right Shoulder Horizontal  ADduction 40 Degrees    Left Shoulder Flexion 130 Degrees    Left Shoulder Internal Rotation 45 Degrees    Left Shoulder External Rotation 90 Degrees    Left Shoulder Horizontal ADduction 35 Degrees      Strength   Overall Strength Deficits    Strength Assessment Site Shoulder    Right/Left Shoulder Left;Right    Right Shoulder Internal Rotation 4+/5    Right Shoulder External Rotation 4/5    Left Shoulder Internal Rotation 4/5    Left Shoulder External Rotation 4-/5                         [facility] Adult PT Treatment/Exercise - 08/25/20 0001      Exercises   Exercises Shoulder      Shoulder Exercises: Supine   Protraction Strengthening    Protraction Weight (lbs) 3#    Internal Rotation AROM;Left;10 reps;Other (comment)    Internal Rotation Limitations 10 seconds    Flexion AAROM;Left;10  reps;Other (comment)   10 seconds     Shoulder Exercises: Seated   Extension --    Theraband Level (Shoulder Extension) --    Extension Limitations --    Retraction Strengthening;Both;10 reps;Other (comment)   5 seconds (shoulder blade pinches)   Row Strengthening;Both;15 reps;Theraband    Theraband Level (Shoulder Row) Level 3 (Green)    Row Limitations 2 sets  External Rotation Strengthening;Both;10 reps;Theraband;Other (comment)    Theraband Level (Shoulder External Rotation) Level 3 (Green)    External Rotation Limitations 2 sets    Other Seated Exercises Posterior capsule stretch 10X 10 seconds      Shoulder Exercises: Pulleys   Flexion --    Scaption --      Shoulder Exercises: ROM/Strengthening   UBE (Upper Arm Bike) L1 4 minutes, alternating front and back  every 30 seconds      Manual Therapy   Manual Therapy Passive ROM    Manual therapy comments IR emphasis                  PT Education - 08/25/20 1131    Education Details Reviewed exam findings and HEP.  Discussed opportunity to continue supervised PT vs transfer into independent rehabilitation.    Person(s) Educated Patient    Methods Explanation;Demonstration;Tactile cues;Verbal cues    Comprehension Verbalized understanding;Need further instruction;Returned demonstration;Verbal cues required;Tactile cues required            PT Short Term Goals - 08/25/20 1132      PT SHORT TERM GOAL #1   Title Pt will be independent in his initial HEP.    Status Achieved      PT SHORT TERM GOAL #2   Title Pt will be able to improve his Left Shoulder flexion to >/= 140 degrees with pain </= 3/10.    Baseline 130 degrees, pain at 3/10    Status Partially Met             PT Long Term Goals - 08/25/20 1132      PT LONG TERM GOAL #1   Title Pt will improve his FOTO score from 41% limitation to </= 31% limitation.    Baseline 52 (was 39) Goal 69    Time 4    Period Weeks    Status On-going      PT LONG  TERM GOAL #2   Title Pt will be able to improve his Left shoulder flexion to >/= 160 degrees wtih pain </= 3/10.    Baseline 130 is 90% of the uninvolved R.    Time 4    Period Weeks    Status Partially Met      PT LONG TERM GOAL #3   Title Pt will improve his L shoulder ER to >/= 70 degrees to improve functional mobility.    Baseline 90    Status Achieved      PT LONG TERM GOAL #4   Title Pt will be able to report better sleep improvement of 50%.    Baseline Difficulty sleeping on the L, otherwise good.    Status Achieved                 Plan - 08/25/20 1136    Clinical Impression Statement Stellan has made great progress with his L shoulder AROM, strength and function since starting his post-surgical physical therapy.  L shoulder AROM is now 91% of the uninvolved R.  Strength is improving week to week.  Sleep is vastly improved, although sleeping on the L side is still limited.  With consistent HEP compliance, Hunt should continue to improve and meet remaining long-term goals.  Recommendations are for him to continue supervised PT up to his follow-up with Dr. August Saucer.  He should be ready for transfer into independent rehabilitation at that time if cleared by Dr. August Saucer.  Jakobi reports feeling ready to go it alone  if Dr. Marlou Sa agrees.    Personal Factors and Comorbidities Comorbidity 3+    Comorbidities anxiety, GERD, arthritis, COPD, CAD, acute respiratory failure, gout, sleep apnea, head injury with loss of conscious    Examination-Activity Limitations Lift;Dressing;Reach Overhead;Carry    Examination-Participation Restrictions Community Activity;Other    Stability/Clinical Decision Making Stable/Uncomplicated    Clinical Decision Making Low    Rehab Potential Good    PT Frequency 2x / week    PT Duration 4 weeks    PT Treatment/Interventions ADLs/Self Care Home Management;Cryotherapy;Electrical Stimulation;Ultrasound;Moist Heat;Functional mobility training;Stair  training;Therapeutic activities;Therapeutic exercise;Balance training;Neuromuscular re-education;Patient/family education;Passive range of motion;Manual techniques;Taping    PT Next Visit Plan Review and progress AROM and strength program as needed    PT Home Exercise Plan [id number]    Consulted and Agree with Plan of Care Patient           Patient will benefit from skilled therapeutic intervention in order to improve the following deficits and impairments:  Pain,Postural dysfunction,Decreased strength,Increased edema,Impaired flexibility,Decreased range of motion  Visit Diagnosis: Abnormal posture  Muscle weakness (generalized)  Chronic pain in left shoulder  Stiffness of left shoulder, not elsewhere classified  Localized edema     Problem List Patient Active Problem List   Diagnosis Date Noted  . Benign neoplasm of transverse colon   . Benign neoplasm of descending colon   . Benign neoplasm of rectum   . Heme positive stool   . Iron deficiency anemia due to chronic blood loss   . Melena   . Abdominal pain, chronic, right lower quadrant   . Chronic anticoagulation   . Symptomatic anemia 09/24/2019  . Pneumonia due to severe acute respiratory syndrome coronavirus 2 (SARS-CoV-2) 02/28/2019  . Renal mass 10/31/2018  . Chest pressure 10/15/2018  . Coronary artery calcification 10/15/2018  . Respiratory failure, acute-on-chronic ([place]) 08/03/2018  . Acute deep vein thrombosis (DVT) of left lower extremity ([place])   . Acute hypoxemic respiratory failure ([place])   . Pulmonary embolism ([place]) 05/31/2018  . COPD exacerbation ([place]) 10/18/2017  . Hypoxia   . Respiratory distress   . Essential hypertension     Farley Ly PT, MPT 08/25/2020, 11:39 AM  [facility] Physical Therapy [address] [place], [place], [address] Phone: [phone]   Fax:  [phone]  Name: Tammie Ellsworth MRN: [id number] Date of Birth: June 05, 1951

## 2020-08-29 DIAGNOSIS — N183 Chronic kidney disease, stage 3 unspecified: Secondary | ICD-10-CM | POA: Diagnosis not present

## 2020-08-29 DIAGNOSIS — I10 Essential (primary) hypertension: Secondary | ICD-10-CM | POA: Diagnosis not present

## 2020-08-29 DIAGNOSIS — M1990 Unspecified osteoarthritis, unspecified site: Secondary | ICD-10-CM | POA: Diagnosis not present

## 2020-08-29 DIAGNOSIS — J449 Chronic obstructive pulmonary disease, unspecified: Secondary | ICD-10-CM | POA: Diagnosis not present

## 2020-08-29 DIAGNOSIS — E7800 Pure hypercholesterolemia, unspecified: Secondary | ICD-10-CM | POA: Diagnosis not present

## 2020-08-29 DIAGNOSIS — I2510 Atherosclerotic heart disease of native coronary artery without angina pectoris: Secondary | ICD-10-CM | POA: Diagnosis not present

## 2020-08-29 DIAGNOSIS — N400 Enlarged prostate without lower urinary tract symptoms: Secondary | ICD-10-CM | POA: Diagnosis not present

## 2020-08-29 DIAGNOSIS — J441 Chronic obstructive pulmonary disease with (acute) exacerbation: Secondary | ICD-10-CM | POA: Diagnosis not present

## 2020-08-29 DIAGNOSIS — K219 Gastro-esophageal reflux disease without esophagitis: Secondary | ICD-10-CM | POA: Diagnosis not present

## 2020-08-30 ENCOUNTER — Ambulatory Visit (INDEPENDENT_AMBULATORY_CARE_PROVIDER_SITE_OTHER): Payer: No Typology Code available for payment source | Admitting: Physical Therapy

## 2020-08-30 ENCOUNTER — Other Ambulatory Visit: Payer: Self-pay

## 2020-08-30 ENCOUNTER — Encounter: Payer: Self-pay | Admitting: Physical Therapy

## 2020-08-30 DIAGNOSIS — M25612 Stiffness of left shoulder, not elsewhere classified: Secondary | ICD-10-CM

## 2020-08-30 DIAGNOSIS — R293 Abnormal posture: Secondary | ICD-10-CM | POA: Diagnosis not present

## 2020-08-30 DIAGNOSIS — R600 Localized edema: Secondary | ICD-10-CM

## 2020-08-30 DIAGNOSIS — M6281 Muscle weakness (generalized): Secondary | ICD-10-CM | POA: Diagnosis not present

## 2020-08-30 DIAGNOSIS — M25512 Pain in left shoulder: Secondary | ICD-10-CM | POA: Diagnosis not present

## 2020-08-30 DIAGNOSIS — G8929 Other chronic pain: Secondary | ICD-10-CM

## 2020-08-30 NOTE — Therapy (Signed)
[facility] Physical Therapy [address] [place], [place], [address] Phone: [phone]   Fax:  [phone]  Physical Therapy Treatment  Patient Details  Name: Gregory Christensen MRN: [id number] Date of Birth: 02-10-1951 Referring Provider (PT): Dean 1012/2021   Encounter Date: 08/30/2020   PT End of Session - 08/30/20 1011    Visit Number 12    Number of Visits 15    Date for PT Re-Evaluation 10/14/20    Authorization Type applied for extension to 10/14/2020    Authorization Time Period 15 visits from 07/15/2020 to 01/11/2021    Authorization - Visit Number 12    Authorization - Number of Visits 15    Progress Note Due on Visit 20    PT Start Time 0930    PT Stop Time 1015    PT Time Calculation (min) 45 min    Activity Tolerance Patient tolerated treatment well;No increased pain    Behavior During Therapy WFL for tasks assessed/performed           Past Medical History:  Diagnosis Date  . Acute deep vein thrombosis (DVT) of left lower extremity ([place])   . Acute hypoxemic respiratory failure ([place])   . Anemia    Iron deficiency  . Anxiety   . Aortic atherosclerosis ([place])   . Arthritis   . Benign prostatic hyperplasia 08/28/2018   UNSPECIFIED WHETHER LOWER URINARY TRACT SYMPTOMS PRESENT  . CAD (coronary artery disease)   . Cancer ([place])   . Chronic back pain   . Chronic hip pain   . Constipation    due to Iron M-W-F  . COPD exacerbation ([place]) 10/18/2017  . Dyspnea   . Emphysema lung ([place])   . Essential hypertension   . GERD (gastroesophageal reflux disease)   . Gout   . Gross hematuria 09/08/2018  . Head injury, acute, with loss of consciousness ([place])    unsure how long he was unresponsive  . History of blood transfusion 05/2019  . History of kidney stones   . History of tobacco abuse   . Hypoxia   . Insomnia   . 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 Pneumonia    in 1980's  . Pre-diabetes   . Pulmonary embolism ([place]) 05/31/2018  . Renal mass     CONCERNING FOR RENAL CELL CARCINOMA DR. Lovena Neighbours  . Respiratory distress   . Respiratory failure, acute-on-chronic ([place]) 08/03/2018  . Seasonal allergies   . Sleep apnea     Past Surgical History:  Procedure Laterality Date  . COLONOSCOPY WITH PROPOFOL N/A 09/27/2019   Procedure: COLONOSCOPY WITH PROPOFOL;  Surgeon: Irene Shipper, MD;  [facility];  Service: Endoscopy;  Laterality: N/A;  . CYSTOSCOPY/URETEROSCOPY/HOLMIUM LASER/STENT PLACEMENT Left 10/31/2018   Procedure: CYSTOSCOPY/URETEROSCOPY;  Surgeon: Ceasar Mons, MD;  [facility];  Service: Urology;  Laterality: Left;  . ESOPHAGOGASTRODUODENOSCOPY (EGD) WITH PROPOFOL N/A 09/27/2019   Procedure: ESOPHAGOGASTRODUODENOSCOPY (EGD) WITH PROPOFOL;  Surgeon: Irene Shipper, MD;  [facility];  Service: Endoscopy;  Laterality: N/A;  . HERNIA REPAIR Left 1977   inguinal  . HYDROCELE EXCISION Left 05/13/2019   Procedure: HYDROCELECTOMY ADULT;  Surgeon: Ceasar Mons, MD;  [facility];  Service: Urology;  Laterality: Left;  Marland Kitchen MANDIBLE SURGERY  1976   EXTENSIVE  . POLYPECTOMY  09/27/2019   Procedure: POLYPECTOMY;  Surgeon: Irene Shipper, MD;  [facility];  Service: Endoscopy;;  . ROBOT ASSITED LAPAROSCOPIC NEPHROURETERECTOMY Left 10/31/2018   Procedure: XI  ROBOT ASSITED LAPAROSCOPIC NEPHROURETERECTOMY;  Surgeon: Ceasar Mons, MD;  [facility];  Service: Urology;  Laterality: Left;  ONLY NEEDS 240 MIN FOR ALL PROCEDURES  . SHOULDER ARTHROSCOPY WITH ROTATOR CUFF REPAIR Left 04/12/2020   Procedure: LEFT SHOULDER ARTHROSCOPY, DEBRIDEMENT, BICEPS TENODESIS, MINI OPEN ROTATOR CUFF TEAR REPAIR;  Surgeon: Meredith Pel, MD;  Location: [facility];  Service: Orthopedics;  Laterality: Left;    There were no vitals filed for this visit.   Subjective Assessment - 08/30/20 0956    Subjective Pt arriving to therapy reporting feeling better since begininng therapy. Pt still feels  like he has not reached his PLOF in L UE.    Pertinent History Pt on 2L oxygen via nasal cannula,    Patient Stated Goals Continue getting stronger and sleep better on the L side.    Currently in Pain? Yes    Pain Score 3     Pain Location Shoulder    Pain Orientation Left    Pain Descriptors / Indicators Aching    Pain Type Surgical pain;Chronic pain    Pain Onset More than a month ago                             [facility] Adult PT Treatment/Exercise - 08/30/20 0001      Exercises   Exercises Shoulder      Shoulder Exercises: Supine   Other Supine Exercises shoulder flexion using 3# bar 2x15      Shoulder Exercises: Seated   External Rotation Strengthening;Both;10 reps;Theraband;Other (comment)    Theraband Level (Shoulder External Rotation) Level 3 (Green)    External Rotation Limitations 2 sets    Other Seated Exercises Posterior capsule stretch 10X 10 seconds      Shoulder Exercises: ROM/Strengthening   UBE (Upper Arm Bike) L1 6 minutes, alternating front and back  every minute      Shoulder Exercises: Power [employer] 15 reps;Limitations    Row Limitations 2 sets, 10#      Manual Therapy   Manual Therapy Passive ROM    Manual therapy comments IR and flexion emphasis                    PT Short Term Goals - 08/30/20 1002      PT SHORT TERM GOAL #1   Title Pt will be independent in his initial HEP.    Status Achieved      PT SHORT TERM GOAL #2   Title Pt will be able to improve his Left Shoulder flexion to >/= 140 degrees with pain </= 3/10.    Baseline 130 degrees, pain at 3/10    Status Partially Met             PT Long Term Goals - 08/30/20 1002      PT LONG TERM GOAL #1   Title Pt will improve his FOTO score from 41% limitation to </= 31% limitation.    Baseline 52 (was 39) Goal 69    Status On-going      PT LONG TERM GOAL #2   Baseline AROM: 135 degrees in seated position. PROM: 150 degrees in supine position    Period  Weeks    Status On-going      PT LONG TERM GOAL #3   Title Pt will improve his L shoulder ER to >/= 70 degrees to improve functional mobility.    Baseline 90  Status Achieved      PT LONG TERM GOAL #4   Status Achieved                 Plan - 08/30/20 1000    Clinical Impression Statement Pt is making progres with therapy progressing with AROM and strength. Still progressing with active left shoulder flexion and IR.  Pt has 3 approved visits left. Recommending pt drop from 2x / week to once a week for the next 3 weeks. Continue skilled PT progressing toward STG and LTG's met.    Personal Factors and Comorbidities Comorbidity 3+    Comorbidities anxiety, GERD, arthritis, COPD, CAD, acute respiratory failure, gout, sleep apnea, head injury with loss of conscious    Examination-Activity Limitations Lift;Dressing;Reach Overhead;Carry    Examination-Participation Restrictions Community Activity;Other    Stability/Clinical Decision Making Stable/Uncomplicated    Rehab Potential Good    PT Frequency 2x / week    PT Duration 4 weeks    PT Treatment/Interventions ADLs/Self Care Home Management;Cryotherapy;Electrical Stimulation;Ultrasound;Moist Heat;Functional mobility training;Stair training;Therapeutic activities;Therapeutic exercise;Balance training;Neuromuscular re-education;Patient/family education;Passive range of motion;Manual techniques;Taping    PT Next Visit Plan Review and progress AROM and strength program as needed    PT Home Exercise Plan [id number]    Consulted and Agree with Plan of Care Patient           Patient will benefit from skilled therapeutic intervention in order to improve the following deficits and impairments:  Pain,Postural dysfunction,Decreased strength,Increased edema,Impaired flexibility,Decreased range of motion  Visit Diagnosis: Abnormal posture  Muscle weakness (generalized)  Chronic pain in left shoulder  Stiffness of left shoulder, not  elsewhere classified  Localized edema  Acute pain of left shoulder     Problem List Patient Active Problem List   Diagnosis Date Noted  . Benign neoplasm of transverse colon   . Benign neoplasm of descending colon   . Benign neoplasm of rectum   . Heme positive stool   . Iron deficiency anemia due to chronic blood loss   . Melena   . Abdominal pain, chronic, right lower quadrant   . Chronic anticoagulation   . Symptomatic anemia 09/24/2019  . Pneumonia due to severe acute respiratory syndrome coronavirus 2 (SARS-CoV-2) 02/28/2019  . Renal mass 10/31/2018  . Chest pressure 10/15/2018  . Coronary artery calcification 10/15/2018  . Respiratory failure, acute-on-chronic ([place]) 08/03/2018  . Acute deep vein thrombosis (DVT) of left lower extremity ([place])   . Acute hypoxemic respiratory failure ([place])   . Pulmonary embolism ([place]) 05/31/2018  . COPD exacerbation ([place]) 10/18/2017  . Hypoxia   . Respiratory distress   . Essential hypertension     Oretha Caprice, PT, MPT 08/30/2020, 10:20 AM  [facility] Physical Therapy [address] [place], [place], [address] Phone: [phone]   Fax:  [phone]  Name: Gregory Christensen MRN: [id number] Date of Birth: Sep 03, 1950

## 2020-08-31 ENCOUNTER — Ambulatory Visit (INDEPENDENT_AMBULATORY_CARE_PROVIDER_SITE_OTHER): Payer: No Typology Code available for payment source | Admitting: Orthopedic Surgery

## 2020-08-31 ENCOUNTER — Encounter: Payer: Self-pay | Admitting: Orthopedic Surgery

## 2020-08-31 DIAGNOSIS — M75122 Complete rotator cuff tear or rupture of left shoulder, not specified as traumatic: Secondary | ICD-10-CM | POA: Diagnosis not present

## 2020-08-31 NOTE — Progress Notes (Signed)
Office Visit Note   Patient: Gregory Christensen           Date of Birth: [birth year]           MRN: [id number] Visit Date: 08/31/2020 Requested by: Leighton Ruff, MD [place],  [place] [id number] PCP: Leighton Ruff, MD  Subjective: Chief Complaint  Patient presents with  . Left Shoulder - Follow-up    04/12/20 Lt shoulder scope w/deb; BT; [facility]    HPI: Gregory Christensen is a patient is now about 4 months out left shoulder arthroscopy debridement biceps tenodesis mini open rotator cuff repair.  He has been doing reasonably well.  Doing therapy 2 times a week upstairs.  Has 3 more physical therapy appointments.  Taking gabapentin at night.  Reports mild pain.              ROS: All systems reviewed are negative as they relate to the chief complaint within the history of present illness.  Patient denies  fevers or chills.   Assessment & Plan: Visit Diagnoses:  1. Complete tear of left rotator cuff, unspecified whether traumatic     Plan: Impression is left shoulder pain.  Patient had rotator cuff tear repair 4 months ago.  Overall the repair feels reasonable.  Some amount of the postop pain as expected.  Rotator cuff strength feels pretty reasonable.  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  Plan for continuation of therapy and follow-up as needed.  No additional intervention required on the left shoulder.  Follow-Up Instructions: Return if symptoms worsen or fail to improve.   Orders:  No orders of the defined types were placed in this encounter.  No orders of the defined types were placed in this encounter.     Procedures: No procedures performed   Clinical Data: No additional findings.  Objective: Vital Signs: There were no vitals taken for this visit.  Physical Exam:   Constitutional: Patient appears well-developed HEENT:  Head: Normocephalic Eyes:EOM are normal Neck: Normal range of motion Cardiovascular: Normal  rate Pulmonary/chest: Effort normal Neurologic: Patient is alert Skin: Skin is warm Psychiatric: Patient has normal mood and affect    Ortho Exam: Ortho exam demonstrates Popeye deformity left arm which is nontender.  Has range of motion 4590 150.  Rotator cuff strength intact infraspinatus supraspinatus subscap muscle testing.  No coarse grinding or crepitus with active or passive range of motion of that left shoulder.  No discrete AC joint tenderness.  Incision intact.  Specialty Comments:  No specialty comments available.  Imaging: No results found.   PMFS History: Patient Active Problem List   Diagnosis Date Noted  . Benign neoplasm of transverse colon   . Benign neoplasm of descending colon   . Benign neoplasm of rectum   . Heme positive stool   . Iron deficiency anemia due to chronic blood loss   . Melena   . Abdominal pain, chronic, right lower quadrant   . Chronic anticoagulation   . Symptomatic anemia 09/24/2019  . Pneumonia due to severe acute respiratory syndrome coronavirus 2 (SARS-CoV-2) 02/28/2019  . Renal mass 10/31/2018  . Chest pressure 10/15/2018  . Coronary artery calcification 10/15/2018  . Respiratory failure, acute-on-chronic ([place]) 08/03/2018  . Acute deep vein thrombosis (DVT) of left lower extremity ([place])   . Acute hypoxemic respiratory failure ([place])   . Pulmonary embolism ([place]) 05/31/2018  . COPD exacerbation ([place]) 10/18/2017  . Hypoxia   . Respiratory distress   .  Essential hypertension    Past Medical History:  Diagnosis Date  . Acute deep vein thrombosis (DVT) of left lower extremity ([place])   . Acute hypoxemic respiratory failure ([place])   . Anemia    Iron deficiency  . Anxiety   . Aortic atherosclerosis ([place])   . Arthritis   . Benign prostatic hyperplasia 08/28/2018   UNSPECIFIED WHETHER LOWER URINARY TRACT SYMPTOMS PRESENT  . CAD (coronary artery disease)   . Cancer ([place])   . Chronic back pain   . Chronic hip pain   . Constipation     due to Iron M-W-F  . COPD exacerbation ([place]) 10/18/2017  . Dyspnea   . Emphysema lung ([place])   . Essential hypertension   . GERD (gastroesophageal reflux disease)   . Gout   . Gross hematuria 09/08/2018  . Head injury, acute, with loss of consciousness ([place])    unsure how long he was unresponsive  . History of blood transfusion 05/2019  . History of kidney stones   . History of tobacco abuse   . Hypoxia   . Insomnia   . 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 Pneumonia    in 1980's  . Pre-diabetes   . Pulmonary embolism ([place]) 05/31/2018  . Renal mass    CONCERNING FOR RENAL CELL CARCINOMA DR. Lovena Neighbours  . Respiratory distress   . Respiratory failure, acute-on-chronic ([place]) 08/03/2018  . Seasonal allergies   . Sleep apnea     Family History  Problem Relation Age of Onset  . Diabetes Mother   . Hypertension Sister   . Hypertension Brother     Past Surgical History:  Procedure Laterality Date  . COLONOSCOPY WITH PROPOFOL N/A 09/27/2019   Procedure: COLONOSCOPY WITH PROPOFOL;  Surgeon: Irene Shipper, MD;  Location: [facility];  Service: Endoscopy;  Laterality: N/A;  . CYSTOSCOPY/URETEROSCOPY/HOLMIUM LASER/STENT PLACEMENT Left 10/31/2018   Procedure: CYSTOSCOPY/URETEROSCOPY;  Surgeon: Ceasar Mons, MD;  [facility]: [facility];  Service: Urology;  Laterality: Left;  . ESOPHAGOGASTRODUODENOSCOPY (EGD) WITH PROPOFOL N/A 09/27/2019   Procedure: ESOPHAGOGASTRODUODENOSCOPY (EGD) WITH PROPOFOL;  Surgeon: Irene Shipper, MD;  [facility];  Service: Endoscopy;  Laterality: N/A;  . HERNIA REPAIR Left 1977   inguinal  . HYDROCELE EXCISION Left 05/13/2019   Procedure: HYDROCELECTOMY ADULT;  Surgeon: Ceasar Mons, MD;  [facility];  Service: Urology;  Laterality: Left;  Marland Kitchen MANDIBLE SURGERY  1976   EXTENSIVE  . POLYPECTOMY  09/27/2019   Procedure: POLYPECTOMY;  Surgeon: Irene Shipper, MD;  [facility];  Service: Endoscopy;;  . ROBOT ASSITED LAPAROSCOPIC  NEPHROURETERECTOMY Left 10/31/2018   Procedure: XI ROBOT ASSITED LAPAROSCOPIC NEPHROURETERECTOMY;  Surgeon: Ceasar Mons, MD;  [facility];  Service: Urology;  Laterality: Left;  ONLY NEEDS 240 MIN FOR ALL PROCEDURES  . SHOULDER ARTHROSCOPY WITH ROTATOR CUFF REPAIR Left 04/12/2020   Procedure: LEFT SHOULDER ARTHROSCOPY, DEBRIDEMENT, BICEPS TENODESIS, MINI OPEN ROTATOR CUFF TEAR REPAIR;  Surgeon: Meredith Pel, MD;  Location: [facility];  Service: Orthopedics;  Laterality: Left;   Social History   Occupational History  . Not on file  Tobacco Use  . Smoking status: Current Every Day Smoker    Packs/day: 0.50    Years: 57.00    Pack years: 28.50    Types: Cigarettes  . Smokeless tobacco: Never Used  Vaping Use  . Vaping Use: Never used  Substance and Sexual Activity  . Alcohol use: Not Currently    Alcohol/week: 10.0  standard drinks    Types: 10 Glasses of wine per week    Comment: 0-2 beers a day   . Drug use: Not Currently    Types: Flunitrazepam  . Sexual activity: Not on file

## 2020-09-01 ENCOUNTER — Encounter: Payer: No Typology Code available for payment source | Admitting: Rehabilitative and Restorative Service Providers"

## 2020-09-02 DIAGNOSIS — J441 Chronic obstructive pulmonary disease with (acute) exacerbation: Secondary | ICD-10-CM | POA: Diagnosis not present

## 2020-09-06 DIAGNOSIS — C659 Malignant neoplasm of unspecified renal pelvis: Secondary | ICD-10-CM | POA: Diagnosis not present

## 2020-09-06 DIAGNOSIS — I82890 Acute embolism and thrombosis of other specified veins: Secondary | ICD-10-CM | POA: Diagnosis not present

## 2020-09-06 DIAGNOSIS — Z85528 Personal history of other malignant neoplasm of kidney: Secondary | ICD-10-CM | POA: Diagnosis not present

## 2020-09-06 DIAGNOSIS — I513 Intracardiac thrombosis, not elsewhere classified: Secondary | ICD-10-CM | POA: Diagnosis not present

## 2020-09-06 DIAGNOSIS — C652 Malignant neoplasm of left renal pelvis: Secondary | ICD-10-CM | POA: Diagnosis not present

## 2020-09-12 ENCOUNTER — Ambulatory Visit (INDEPENDENT_AMBULATORY_CARE_PROVIDER_SITE_OTHER): Payer: No Typology Code available for payment source | Admitting: Physical Therapy

## 2020-09-12 ENCOUNTER — Encounter: Payer: Self-pay | Admitting: Physical Therapy

## 2020-09-12 ENCOUNTER — Other Ambulatory Visit: Payer: Self-pay

## 2020-09-12 DIAGNOSIS — M6281 Muscle weakness (generalized): Secondary | ICD-10-CM

## 2020-09-12 DIAGNOSIS — M25512 Pain in left shoulder: Secondary | ICD-10-CM | POA: Diagnosis not present

## 2020-09-12 DIAGNOSIS — M25612 Stiffness of left shoulder, not elsewhere classified: Secondary | ICD-10-CM | POA: Diagnosis not present

## 2020-09-12 DIAGNOSIS — R600 Localized edema: Secondary | ICD-10-CM

## 2020-09-12 DIAGNOSIS — G8929 Other chronic pain: Secondary | ICD-10-CM

## 2020-09-12 DIAGNOSIS — R293 Abnormal posture: Secondary | ICD-10-CM

## 2020-09-12 NOTE — Therapy (Signed)
[facility] [address] [place], [place], [address] Phone: [phone]   Fax:  [phone]  Physical Therapy Treatment/Discharge PHYSICAL THERAPY DISCHARGE SUMMARY  Visits from Start of Care: 13  Current functional level related to goals / functional outcomes: See below   Remaining deficits: See below   Education / Equipment: HEP  Plan: Patient agrees to discharge.  Patient goals were met. Patient is being discharged due to being pleased with the current functional level.  ?????       Patient Details  Name: Gregory Christensen MRN: [id number] Date of Birth: January 31, 1951 Referring Provider (PT): Dean 1012/2021   Encounter Date: 09/12/2020   PT End of Session - 09/12/20 1012    Visit Number 13    Number of Visits 15    Date for PT Re-Evaluation 10/14/20    Authorization Type applied for extension to 10/14/2020    Authorization Time Period 15 visits from 07/15/2020 to 01/11/2021    Authorization - Visit Number 13    Authorization - Number of Visits 15    Progress Note Due on Visit 20    PT Start Time 0936    PT Stop Time 8182    PT Time Calculation (min) 38 min    Activity Tolerance Patient tolerated treatment well;No increased pain    Behavior During Therapy WFL for tasks assessed/performed           Past Medical History:  Diagnosis Date  . Acute deep vein thrombosis (DVT) of left lower extremity ([place])   . Acute hypoxemic respiratory failure ([place])   . Anemia    Iron deficiency  . Anxiety   . Aortic atherosclerosis ([place])   . Arthritis   . Benign prostatic hyperplasia 08/28/2018   UNSPECIFIED WHETHER LOWER URINARY TRACT SYMPTOMS PRESENT  . CAD (coronary artery disease)   . Cancer ([place])   . Chronic back pain   . Chronic hip pain   . Constipation    due to Iron M-W-F  . COPD exacerbation ([place]) 10/18/2017  . Dyspnea   . Emphysema lung ([place])   . Essential hypertension   . GERD (gastroesophageal reflux disease)   . Gout   .  Gross hematuria 09/08/2018  . Head injury, acute, with loss of consciousness ([place])    unsure how long he was unresponsive  . History of blood transfusion 05/2019  . History of kidney stones   . History of tobacco abuse   . Hypoxia   . Insomnia   . 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 Pneumonia    in 1980's  . Pre-diabetes   . Pulmonary embolism ([place]) 05/31/2018  . Renal mass    CONCERNING FOR RENAL CELL CARCINOMA DR. Lovena Neighbours  . Respiratory distress   . Respiratory failure, acute-on-chronic ([place]) 08/03/2018  . Seasonal allergies   . Sleep apnea     Past Surgical History:  Procedure Laterality Date  . COLONOSCOPY WITH PROPOFOL N/A 09/27/2019   Procedure: COLONOSCOPY WITH PROPOFOL;  Surgeon: Irene Shipper, MD;  [facility];  Service: Endoscopy;  Laterality: N/A;  . CYSTOSCOPY/URETEROSCOPY/HOLMIUM LASER/STENT PLACEMENT Left 10/31/2018   Procedure: CYSTOSCOPY/URETEROSCOPY;  Surgeon: Ceasar Mons, MD;  [facility];  Service: Urology;  Laterality: Left;  . ESOPHAGOGASTRODUODENOSCOPY (EGD) WITH PROPOFOL N/A 09/27/2019   Procedure: ESOPHAGOGASTRODUODENOSCOPY (EGD) WITH PROPOFOL;  Surgeon: Irene Shipper, MD;  [facility];  Service: Endoscopy;  Laterality: N/A;  . HERNIA REPAIR Left 1977   inguinal  . HYDROCELE EXCISION  Left 05/13/2019   Procedure: HYDROCELECTOMY ADULT;  Surgeon: Ceasar Mons, MD;  [facility];  Service: Urology;  Laterality: Left;  Marland Kitchen MANDIBLE SURGERY  1976   EXTENSIVE  . POLYPECTOMY  09/27/2019   Procedure: POLYPECTOMY;  Surgeon: Irene Shipper, MD;  [facility];  Service: Endoscopy;;  . ROBOT ASSITED LAPAROSCOPIC NEPHROURETERECTOMY Left 10/31/2018   Procedure: XI ROBOT ASSITED LAPAROSCOPIC NEPHROURETERECTOMY;  Surgeon: Ceasar Mons, MD;  [facility];  Service: Urology;  Laterality: Left;  ONLY NEEDS 240 MIN FOR ALL PROCEDURES  . SHOULDER ARTHROSCOPY WITH ROTATOR CUFF REPAIR Left 04/12/2020    Procedure: LEFT SHOULDER ARTHROSCOPY, DEBRIDEMENT, BICEPS TENODESIS, MINI OPEN ROTATOR CUFF TEAR REPAIR;  Surgeon: Meredith Pel, MD;  Location: [facility];  Service: Orthopedics;  Laterality: Left;    There were no vitals filed for this visit.   Subjective Assessment - 09/12/20 0948    Subjective Pt arriving to therapy without complaints of pain today, he now feels he is back to baseline and would like to discharge from PT today.    Pertinent History Pt on 2L oxygen via nasal cannula,    Diagnostic tests x-ray    Patient Stated Goals Continue getting stronger and sleep better on the L side.    Pain Onset More than a month ago              [facility] PT Assessment - 09/12/20 0001      Assessment   Medical Diagnosis L rotator cuff repair    Referring Provider (PT) Dean 1012/2021    Onset Date/Surgical Date 04/12/20    Hand Dominance Right      Observation/Other Assessments   Focus on Therapeutic Outcomes (FOTO)  62% funcitonal score      ROM / Strength   AROM / PROM / Strength AROM;PROM;Strength      AROM   Right Shoulder External Rotation --   WNL   Left Shoulder Extension 145 Degrees    Left Shoulder ABduction 145 Degrees    Left Shoulder Internal Rotation 60 Degrees    Left Shoulder External Rotation 90 Degrees      PROM   PROM Assessment Site Shoulder    Right/Left Shoulder Left    Left Shoulder Flexion 150 Degrees    Left Shoulder ABduction 165 Degrees    Left Shoulder Internal Rotation 65 Degrees    Left Shoulder External Rotation 90 Degrees      Strength   Left Shoulder Flexion 4/5    Left Shoulder ABduction 4/5    Left Shoulder Internal Rotation 5/5    Left Shoulder External Rotation 4+/5            [facility] Adult PT Treatment/Exercise - 09/12/20 0001      Shoulder Exercises: Supine   Other Supine Exercises shoulder flexion and shoulder ER using 3# bar 2x15      Shoulder Exercises: Seated   Extension Both;20 reps    Theraband Level (Shoulder Extension)  Level 3 (Green)    Row Both;20 reps    Theraband Level (Shoulder Row) Level 3 (Green)    Engineer, technical sales;Both    Theraband Level (Shoulder External Rotation) Level 3 (Green)    External Rotation Limitations 3X10    Other Seated Exercises Posterior capsule stretch 10X 10 seconds      Shoulder Exercises: Pulleys   Flexion 3 minutes    ABduction 2 minutes      Manual Therapy   Manual therapy comments PROM Lt  shoulder all planes                    PT Short Term Goals - 09/12/20 1018      PT SHORT TERM GOAL #1   Title Pt will be independent in his initial HEP.    Status Achieved      PT SHORT TERM GOAL #2   Title Pt will be able to improve his Left Shoulder flexion to >/= 140 degrees with pain </= 3/10.    Time 3    Period Weeks    Status Achieved             PT Long Term Goals - 09/12/20 1019      PT LONG TERM GOAL #1   Title Pt will improve his FOTO score from 41% limitation to </= 31% limitation.    Baseline 62 today    Status Partially Met      PT LONG TERM GOAL #2   Baseline now met for PROM    Period Weeks    Status Achieved      PT LONG TERM GOAL #3   Title Pt will improve his L shoulder ER to >/= 70 degrees to improve functional mobility.    Baseline 90    Status Achieved      PT LONG TERM GOAL #4   Title Pt will be able to report better sleep improvement of 50%.    Status Achieved                 Plan - 09/12/20 1013    Clinical Impression Statement He has done reasonably well with PT and he feels he is back to baseline and would like to discharge today. He only has mild deficits now in ROM and strength and feels he can continue to progress with this at home with HEP. He had no further questions or concerns and will be discharged today.    Personal Factors and Comorbidities Comorbidity 3+    Comorbidities anxiety, GERD, arthritis, COPD, CAD, acute respiratory failure, gout, sleep apnea, head injury with loss of conscious     Examination-Activity Limitations Lift;Dressing;Reach Overhead;Carry    Examination-Participation Restrictions Community Activity;Other    Stability/Clinical Decision Making Stable/Uncomplicated    Rehab Potential Good    PT Frequency 2x / week    PT Duration 4 weeks    PT Treatment/Interventions ADLs/Self Care Home Management;Cryotherapy;Electrical Stimulation;Ultrasound;Moist Heat;Functional mobility training;Stair training;Therapeutic activities;Therapeutic exercise;Balance training;Neuromuscular re-education;Patient/family education;Passive range of motion;Manual techniques;Taping    PT Next Visit Plan DC today    PT Home Exercise Plan [id number]    Consulted and Agree with Plan of Care Patient           Patient will benefit from skilled therapeutic intervention in order to improve the following deficits and impairments:  Pain,Postural dysfunction,Decreased strength,Increased edema,Impaired flexibility,Decreased range of motion  Visit Diagnosis: Muscle weakness (generalized)  Chronic pain in left shoulder  Stiffness of left shoulder, not elsewhere classified  Localized edema  Abnormal posture     Problem List Patient Active Problem List   Diagnosis Date Noted  . Benign neoplasm of transverse colon   . Benign neoplasm of descending colon   . Benign neoplasm of rectum   . Heme positive stool   . Iron deficiency anemia due to chronic blood loss   . Melena   . Abdominal pain, chronic, right lower quadrant   . Chronic anticoagulation   . Symptomatic anemia 09/24/2019  . Pneumonia  due to severe acute respiratory syndrome coronavirus 2 (SARS-CoV-2) 02/28/2019  . Renal mass 10/31/2018  . Chest pressure 10/15/2018  . Coronary artery calcification 10/15/2018  . Respiratory failure, acute-on-chronic ([place]) 08/03/2018  . Acute deep vein thrombosis (DVT) of left lower extremity ([place])   . Acute hypoxemic respiratory failure ([place])   . Pulmonary embolism ([place]) 05/31/2018  .  COPD exacerbation ([place]) 10/18/2017  . Hypoxia   . Respiratory distress   . Essential hypertension     Silvestre Mesi 09/12/2020, 10:22 AM  [facility] [address] [place], [place], [address] Phone: [phone]   Fax:  [phone]  Name: Gregory Christensen MRN: [id number] Date of Birth: 04/26/1951

## 2020-09-20 ENCOUNTER — Encounter: Payer: No Typology Code available for payment source | Admitting: Physical Therapy

## 2020-09-26 ENCOUNTER — Encounter: Payer: No Typology Code available for payment source | Admitting: Physical Therapy

## 2020-09-27 DIAGNOSIS — E7800 Pure hypercholesterolemia, unspecified: Secondary | ICD-10-CM | POA: Diagnosis not present

## 2020-09-27 DIAGNOSIS — M1990 Unspecified osteoarthritis, unspecified site: Secondary | ICD-10-CM | POA: Diagnosis not present

## 2020-09-27 DIAGNOSIS — N400 Enlarged prostate without lower urinary tract symptoms: Secondary | ICD-10-CM | POA: Diagnosis not present

## 2020-09-27 DIAGNOSIS — J441 Chronic obstructive pulmonary disease with (acute) exacerbation: Secondary | ICD-10-CM | POA: Diagnosis not present

## 2020-09-27 DIAGNOSIS — I10 Essential (primary) hypertension: Secondary | ICD-10-CM | POA: Diagnosis not present

## 2020-09-27 DIAGNOSIS — N183 Chronic kidney disease, stage 3 unspecified: Secondary | ICD-10-CM | POA: Diagnosis not present

## 2020-09-27 DIAGNOSIS — J449 Chronic obstructive pulmonary disease, unspecified: Secondary | ICD-10-CM | POA: Diagnosis not present

## 2020-09-27 DIAGNOSIS — I2510 Atherosclerotic heart disease of native coronary artery without angina pectoris: Secondary | ICD-10-CM | POA: Diagnosis not present

## 2020-09-27 DIAGNOSIS — K219 Gastro-esophageal reflux disease without esophagitis: Secondary | ICD-10-CM | POA: Diagnosis not present

## 2020-10-03 DIAGNOSIS — J441 Chronic obstructive pulmonary disease with (acute) exacerbation: Secondary | ICD-10-CM | POA: Diagnosis not present

## 2020-10-27 DIAGNOSIS — N5201 Erectile dysfunction due to arterial insufficiency: Secondary | ICD-10-CM | POA: Diagnosis not present

## 2020-10-27 DIAGNOSIS — C652 Malignant neoplasm of left renal pelvis: Secondary | ICD-10-CM | POA: Diagnosis not present

## 2020-10-27 DIAGNOSIS — N401 Enlarged prostate with lower urinary tract symptoms: Secondary | ICD-10-CM | POA: Diagnosis not present

## 2020-10-27 DIAGNOSIS — R3915 Urgency of urination: Secondary | ICD-10-CM | POA: Diagnosis not present

## 2020-11-02 DIAGNOSIS — J441 Chronic obstructive pulmonary disease with (acute) exacerbation: Secondary | ICD-10-CM | POA: Diagnosis not present

## 2020-11-11 DIAGNOSIS — J441 Chronic obstructive pulmonary disease with (acute) exacerbation: Secondary | ICD-10-CM | POA: Diagnosis not present

## 2020-11-11 DIAGNOSIS — M1990 Unspecified osteoarthritis, unspecified site: Secondary | ICD-10-CM | POA: Diagnosis not present

## 2020-11-11 DIAGNOSIS — N183 Chronic kidney disease, stage 3 unspecified: Secondary | ICD-10-CM | POA: Diagnosis not present

## 2020-11-11 DIAGNOSIS — N400 Enlarged prostate without lower urinary tract symptoms: Secondary | ICD-10-CM | POA: Diagnosis not present

## 2020-11-11 DIAGNOSIS — E7800 Pure hypercholesterolemia, unspecified: Secondary | ICD-10-CM | POA: Diagnosis not present

## 2020-11-11 DIAGNOSIS — K219 Gastro-esophageal reflux disease without esophagitis: Secondary | ICD-10-CM | POA: Diagnosis not present

## 2020-11-11 DIAGNOSIS — J449 Chronic obstructive pulmonary disease, unspecified: Secondary | ICD-10-CM | POA: Diagnosis not present

## 2020-11-11 DIAGNOSIS — I2510 Atherosclerotic heart disease of native coronary artery without angina pectoris: Secondary | ICD-10-CM | POA: Diagnosis not present

## 2020-11-11 DIAGNOSIS — I10 Essential (primary) hypertension: Secondary | ICD-10-CM | POA: Diagnosis not present

## 2020-12-03 DIAGNOSIS — J441 Chronic obstructive pulmonary disease with (acute) exacerbation: Secondary | ICD-10-CM | POA: Diagnosis not present

## 2020-12-12 DIAGNOSIS — I700 Atherosclerosis of aorta: Secondary | ICD-10-CM | POA: Diagnosis not present

## 2020-12-12 DIAGNOSIS — N390 Urinary tract infection, site not specified: Secondary | ICD-10-CM | POA: Diagnosis not present

## 2020-12-12 DIAGNOSIS — J449 Chronic obstructive pulmonary disease, unspecified: Secondary | ICD-10-CM | POA: Diagnosis not present

## 2020-12-12 DIAGNOSIS — I10 Essential (primary) hypertension: Secondary | ICD-10-CM | POA: Diagnosis not present

## 2020-12-12 DIAGNOSIS — Z905 Acquired absence of kidney: Secondary | ICD-10-CM | POA: Diagnosis not present

## 2020-12-12 DIAGNOSIS — E7800 Pure hypercholesterolemia, unspecified: Secondary | ICD-10-CM | POA: Diagnosis not present

## 2020-12-12 DIAGNOSIS — Z0000 Encounter for general adult medical examination without abnormal findings: Secondary | ICD-10-CM | POA: Diagnosis not present

## 2020-12-12 DIAGNOSIS — Z86711 Personal history of pulmonary embolism: Secondary | ICD-10-CM | POA: Diagnosis not present

## 2020-12-12 DIAGNOSIS — N1832 Chronic kidney disease, stage 3b: Secondary | ICD-10-CM | POA: Diagnosis not present

## 2020-12-12 DIAGNOSIS — D6869 Other thrombophilia: Secondary | ICD-10-CM | POA: Diagnosis not present

## 2020-12-20 ENCOUNTER — Encounter (HOSPITAL_COMMUNITY): Payer: Self-pay

## 2020-12-20 ENCOUNTER — Other Ambulatory Visit: Payer: Self-pay

## 2020-12-20 ENCOUNTER — Emergency Department (HOSPITAL_COMMUNITY)
Admission: EM | Admit: 2020-12-20 | Discharge: 2020-12-20 | Disposition: A | Discharge status: Home / Self Care | Payer: No Typology Code available for payment source | Attending: Emergency Medicine | Admitting: Emergency Medicine

## 2020-12-20 DIAGNOSIS — Z85038 Personal history of other malignant neoplasm of large intestine: Secondary | ICD-10-CM | POA: Insufficient documentation

## 2020-12-20 DIAGNOSIS — Z7952 Long term (current) use of systemic steroids: Secondary | ICD-10-CM | POA: Diagnosis not present

## 2020-12-20 DIAGNOSIS — I119 Hypertensive heart disease without heart failure: Secondary | ICD-10-CM | POA: Diagnosis not present

## 2020-12-20 DIAGNOSIS — I2510 Atherosclerotic heart disease of native coronary artery without angina pectoris: Secondary | ICD-10-CM | POA: Insufficient documentation

## 2020-12-20 DIAGNOSIS — J441 Chronic obstructive pulmonary disease with (acute) exacerbation: Secondary | ICD-10-CM | POA: Diagnosis not present

## 2020-12-20 DIAGNOSIS — R8279 Other abnormal findings on microbiological examination of urine: Secondary | ICD-10-CM | POA: Insufficient documentation

## 2020-12-20 DIAGNOSIS — Z7901 Long term (current) use of anticoagulants: Secondary | ICD-10-CM | POA: Insufficient documentation

## 2020-12-20 DIAGNOSIS — Z87891 Personal history of nicotine dependence: Secondary | ICD-10-CM | POA: Diagnosis not present

## 2020-12-20 DIAGNOSIS — Z79899 Other long term (current) drug therapy: Secondary | ICD-10-CM | POA: Insufficient documentation

## 2020-12-20 DIAGNOSIS — N390 Urinary tract infection, site not specified: Secondary | ICD-10-CM | POA: Insufficient documentation

## 2020-12-20 LAB — CBC WITH DIFFERENTIAL/PLATELET
Abs Immature Granulocytes: 0.02 10*3/uL (ref 0.00–0.07)
Basophils Absolute: 0 10*3/uL (ref 0.0–0.1)
Basophils Relative: 1 %
Eosinophils Absolute: 0.2 10*3/uL (ref 0.0–0.5)
Eosinophils Relative: 4 %
HCT: 48 % (ref 39.0–52.0)
Hemoglobin: 14.9 g/dL (ref 13.0–17.0)
Immature Granulocytes: 0 %
Lymphocytes Relative: 35 %
Lymphs Abs: 1.7 10*3/uL (ref 0.7–4.0)
MCH: 28.2 pg (ref 26.0–34.0)
MCHC: 31 g/dL (ref 30.0–36.0)
MCV: 90.7 fL (ref 80.0–100.0)
Monocytes Absolute: 0.5 10*3/uL (ref 0.1–1.0)
Monocytes Relative: 11 %
Neutro Abs: 2.4 10*3/uL (ref 1.7–7.7)
Neutrophils Relative %: 49 %
Platelets: 191 10*3/uL (ref 150–400)
RBC: 5.29 MIL/uL (ref 4.22–5.81)
RDW: 14 % (ref 11.5–15.5)
WBC: 4.9 10*3/uL (ref 4.0–10.5)
nRBC: 0 % (ref 0.0–0.2)

## 2020-12-20 LAB — BASIC METABOLIC PANEL
Anion gap: 5 (ref 5–15)
BUN: 12 mg/dL (ref 8–23)
CO2: 25 mmol/L (ref 22–32)
Calcium: 9.2 mg/dL (ref 8.9–10.3)
Chloride: 107 mmol/L (ref 98–111)
Creatinine, Ser: 1.81 mg/dL — ABNORMAL HIGH (ref 0.61–1.24)
GFR, Estimated: 40 mL/min — ABNORMAL LOW (ref >60)
Glucose, Bld: 96 mg/dL (ref 70–99)
Potassium: 4.8 mmol/L (ref 3.5–5.1)
Sodium: 137 mmol/L (ref 135–145)

## 2020-12-20 LAB — URINALYSIS, ROUTINE W REFLEX MICROSCOPIC
Bacteria, UA: NONE SEEN
Bilirubin Urine: NEGATIVE
Glucose, UA: NEGATIVE mg/dL
Ketones, ur: NEGATIVE mg/dL
Nitrite: NEGATIVE
Protein, ur: NEGATIVE mg/dL
Specific Gravity, Urine: 1.013 (ref 1.005–1.030)
pH: 6 (ref 5.0–8.0)

## 2020-12-20 NOTE — ED Triage Notes (Signed)
Patient states he saw his PCP 8 days ago and was prescribed an antibiotic for a UTI. Patient was called to day and told to come to the ED for IV antibiotics for his UTI.  Patient is on Home O2 2L/min via [place].

## 2020-12-20 NOTE — ED Provider Notes (Signed)
[facility] DEPT Provider Note   CSN: [id number] Arrival date & time: 12/20/20  1009     History Chief Complaint  Patient presents with   Abnormal Lab    Gregory Christensen is a 70 y.o. male.  HPI Patient presents with concern of abnormal urine culture.  He does have ongoing mild polyuria, dysuria, though less so since he started treatment with Augmentin about 1 week ago.  No fever, nausea, vomiting.  He states that he feels generally well, denies focal pain. He does have multiple medical issues, including recurrent urinary tract infection. 1 week ago after having symptoms for few days he went to his physician's office.  There he was found of urinary tract infection.  He was started on Augmentin, as above.  Urinalysis was followed by urine culture.  Today that result became available and it was notable for Pseudomonas aeruginosa.  He was advised to come to the emergency department for evaluation.    Past Medical History:  Diagnosis Date   Acute deep vein thrombosis (DVT) of left lower extremity (HCC)    Acute hypoxemic respiratory failure (HCC)    Anemia    Iron deficiency   Anxiety    Aortic atherosclerosis (HCC)    Arthritis    Benign prostatic hyperplasia 08/28/2018   UNSPECIFIED WHETHER LOWER URINARY TRACT SYMPTOMS PRESENT   CAD (coronary artery disease)    Cancer (HCC)    Chronic back pain    Chronic hip pain    Constipation    due to Iron M-W-F   COPD exacerbation ([place]) 10/18/2017   Dyspnea    Emphysema lung ([place])    Essential hypertension    GERD (gastroesophageal reflux disease)    Gout    Gross hematuria 09/08/2018   Head injury, acute, with loss of consciousness ([place])    unsure how long he was unresponsive   History of blood transfusion 05/2019   History of kidney stones    History of tobacco abuse    Hypoxia    Insomnia    Oxygen dependent    2l- 24/7   Pneumonia    in 1980's   Pre-diabetes    Pulmonary embolism ([place])  05/31/2018   Renal mass    CONCERNING FOR RENAL CELL CARCINOMA DR. Lovena Neighbours   Respiratory distress    Respiratory failure, acute-on-chronic ([place]) 08/03/2018   Seasonal allergies    Sleep apnea     Patient Active Problem List   Diagnosis Date Noted   Benign neoplasm of transverse colon    Benign neoplasm of descending colon    Benign neoplasm of rectum    Heme positive stool    Iron deficiency anemia due to chronic blood loss    [place]    Abdominal pain, chronic, right lower quadrant    Chronic anticoagulation    Symptomatic anemia 09/24/2019   Pneumonia due to severe acute respiratory syndrome coronavirus 2 (SARS-CoV-2) 02/28/2019   Renal mass 10/31/2018   Chest pressure 10/15/2018   Coronary artery calcification 10/15/2018   Respiratory failure, acute-on-chronic ([place]) 08/03/2018   Acute deep vein thrombosis (DVT) of left lower extremity (HCC)    Acute hypoxemic respiratory failure ([place])    Pulmonary embolism ([place]) 05/31/2018   COPD exacerbation ([place]) 10/18/2017   Hypoxia    Respiratory distress    Essential hypertension     Past Surgical History:  Procedure Laterality Date   COLONOSCOPY WITH PROPOFOL N/A 09/27/2019   Procedure: COLONOSCOPY WITH PROPOFOL;  Surgeon: [name]  N, MD;  Location: [facility];  Service: Endoscopy;  Laterality: N/A;   CYSTOSCOPY/URETEROSCOPY/HOLMIUM LASER/STENT PLACEMENT Left 10/31/2018   Procedure: CYSTOSCOPY/URETEROSCOPY;  Surgeon: Ceasar Mons, MD;  Location: [facility];  Service: Urology;  Laterality: Left;   ESOPHAGOGASTRODUODENOSCOPY (EGD) WITH PROPOFOL N/A 09/27/2019   Procedure: ESOPHAGOGASTRODUODENOSCOPY (EGD) WITH PROPOFOL;  Surgeon: Irene Shipper, MD;  Location: [facility];  Service: Endoscopy;  Laterality: N/A;   HERNIA REPAIR Left 1977   inguinal   HYDROCELE EXCISION Left 05/13/2019   Procedure: HYDROCELECTOMY ADULT;  Surgeon: Ceasar Mons, MD;  Location: [facility];  Service: Urology;  Laterality: Left;   [place]   EXTENSIVE   POLYPECTOMY  09/27/2019   Procedure: POLYPECTOMY;  Surgeon: Irene Shipper, MD;  Location: [facility];  Service: Endoscopy;;   ROBOT ASSITED LAPAROSCOPIC NEPHROURETERECTOMY Left 10/31/2018   Procedure: XI ROBOT ASSITED LAPAROSCOPIC NEPHROURETERECTOMY;  Surgeon: Ceasar Mons, MD;  Location: [facility];  Service: Urology;  Laterality: Left;  ONLY NEEDS 240 MIN FOR ALL PROCEDURES   SHOULDER ARTHROSCOPY WITH ROTATOR CUFF REPAIR Left 04/12/2020   Procedure: LEFT SHOULDER ARTHROSCOPY, DEBRIDEMENT, BICEPS TENODESIS, MINI OPEN ROTATOR CUFF TEAR REPAIR;  Surgeon: Meredith Pel, MD;  Location: [facility];  Service: Orthopedics;  Laterality: Left;       Family History  Problem Relation Age of Onset   Diabetes Mother    Hypertension Sister    Hypertension Brother     Social History   Tobacco Use   Smoking status: Former    Packs/day: 0.50    Years: 57.00    Pack years: 28.50    Types: Cigarettes   Smokeless tobacco: Never  Vaping Use   Vaping Use: Never used  Substance Use Topics   Alcohol use: Not Currently    Alcohol/week: 10.0 standard drinks    Types: 10 Glasses of wine per week    Comment: 0-2 beers a day    Drug use: Not Currently    Types: Flunitrazepam    Home Medications Prior to Admission medications   Medication Sig Start Date End Date Taking? Authorizing Provider  albuterol (PROVENTIL HFA;VENTOLIN HFA) 108 (90 Base) MCG/ACT inhaler Inhale 2 puffs into the lungs every 2 (two) hours as needed for wheezing or shortness of breath. 08/04/18   Reyne Dumas, MD  albuterol (PROVENTIL) (2.5 MG/3ML) 0.083% nebulizer solution Take 3 mLs (2.5 mg total) by nebulization every 6 (six) hours as needed for wheezing or shortness of breath. 08/04/18   Reyne Dumas, MD  amLODipine (NORVASC) 2.5 MG tablet Take 2.5 mg by mouth daily.    [provider]  apixaban (ELIQUIS) 5 MG TABS tablet Take 1 tablet (5 mg total) by mouth 2 (two) times daily.  06/10/18   Regalado, Belkys A, MD  budesonide-formoterol (SYMBICORT) 160-4.5 MCG/ACT inhaler Inhale 1 puff into the lungs 2 (two) times daily. Patient taking differently: Inhale 1 puff into the lungs 2 (two) times daily as needed (shortness of breath).  08/04/18   Reyne Dumas, MD  cephALEXin (KEFLEX) 500 MG capsule Take 1 capsule (500 mg total) by mouth 2 (two) times daily. Patient not taking: Reported on 05/07/2020 04/13/20   Orpah Greek, MD  Dextran 70-Hypromellose (ARTIFICIAL TEARS) 0.1-0.3 % SOLN Place 1 drop into both eyes daily as needed (dry eyes).    [provider]  diclofenac Sodium (VOLTAREN) 1 % GEL Apply 2 g topically 2 (two) times daily as needed (Pain).    [provider]  docusate  sodium (COLACE) 100 MG capsule Take 100 mg by mouth daily as needed for mild constipation.    [provider]  doxycycline (VIBRAMYCIN) 100 MG capsule Take 1 capsule (100 mg total) by mouth 2 (two) times daily. 05/07/20   Orpah Greek, MD  famotidine (PEPCID) 20 MG tablet Take 20 mg by mouth daily.    [provider]  ferrous sulfate 325 (65 FE) MG tablet Take 325 mg by mouth daily with breakfast.    [provider]  fluticasone (FLONASE) 50 MCG/ACT nasal spray Place 1 spray into both nostrils 2 (two) times daily. Patient taking differently: Place 1 spray into both nostrils daily as needed for allergies.  08/04/18   Reyne Dumas, MD  gabapentin (NEURONTIN) 300 MG capsule Take 400 mg by mouth 3 (three) times daily.     [provider]  HYDROcodone-acetaminophen (NORCO/VICODIN) 5-325 MG tablet Take 1 tablet by mouth daily as needed for moderate pain. 06/07/20 06/07/21  Magnant, Gerrianne Scale, PA-C  hydroxypropyl methylcellulose / hypromellose (ISOPTO TEARS / GONIOVISC) 2.5 % ophthalmic solution Place 1 drop into both eyes 3 (three) times daily as needed for dry eyes.    [provider]  lidocaine (LIDODERM) 5 % Place 1 patch onto the  skin daily. Remove & Discard patch within 12 hours or as directed by MD    [provider]  loratadine (CLARITIN) 10 MG tablet Take 10 mg by mouth daily as needed for allergies.     [provider]  methocarbamol (ROBAXIN) 500 MG tablet Take 1 tablet (500 mg total) by mouth every 8 (eight) hours as needed. 06/07/20   Magnant, Charles L, PA-C  montelukast (SINGULAIR) 10 MG tablet Take 10 mg by mouth at bedtime.    [provider]  pantoprazole (PROTONIX) 40 MG tablet Take 40 mg by mouth daily.    [provider]  pravastatin (PRAVACHOL) 20 MG tablet Take 20 mg by mouth at bedtime.    [provider]  predniSONE (DELTASONE) 20 MG tablet Take 2 tablets (40 mg total) by mouth daily with breakfast. 06/20/20   Truddie Hidden, MD  tamsulosin (FLOMAX) 0.4 MG CAPS capsule Take 0.4 mg by mouth at bedtime.     [provider]  Tiotropium Bromide Monohydrate (SPIRIVA RESPIMAT) 2.5 MCG/ACT AERS Inhale 2 puffs into the lungs daily.    [provider]    Allergies    Patient has no known allergies.  Review of Systems   Review of Systems  Constitutional:        Per HPI, otherwise negative  HENT:         Per HPI, otherwise negative  Respiratory:         Wears home oxygen, history of COPD, no change in amount of oxygen required.  Cardiovascular:        Per HPI, otherwise negative  Gastrointestinal:  Negative for vomiting.  Endocrine:       Negative aside from HPI  Genitourinary:        Neg aside from HPI   Musculoskeletal:        Per HPI, otherwise negative  Skin: Negative.   Neurological:  Negative for syncope.   Physical Exam Updated Vital Signs BP 138/71 (BP Location: Left Arm)   Pulse 88   Temp 98.3 F (36.8 C) (Oral)   Resp 20   Ht 6\' 1"  (1.854 m)   Wt 76.7 kg   SpO2 99%   BMI 22.30 kg/m  Physical Exam Vitals and nursing note reviewed.  Constitutional:      General: He is not in acute distress.    Appearance: He  is well-developed.  HENT:     Head: Normocephalic and atraumatic.  Eyes:     Conjunctiva/sclera: Conjunctivae normal.  Cardiovascular:     Rate and Rhythm: Normal rate and regular rhythm.  Pulmonary:     Effort: Pulmonary effort is normal. No respiratory distress.     Breath sounds: No stridor.     Comments: Supplemental oxygen in place, no increased work of breathing. Abdominal:     General: There is no distension.     Tenderness: There is no abdominal tenderness. There is no guarding.  Skin:    General: Skin is warm and dry.  Neurological:     Mental Status: He is alert and oriented to person, place, and time.    ED Results / Procedures / Treatments   Labs (all labs ordered are listed, but only abnormal results are displayed) Labs Reviewed  BASIC METABOLIC PANEL - Abnormal; Notable for the following components:      Result Value   Creatinine, Ser 1.81 (*)    GFR, Estimated 40 (*)    All other components within normal limits  URINALYSIS, ROUTINE W REFLEX MICROSCOPIC - Abnormal; Notable for the following components:   Hgb urine dipstick SMALL (*)    Leukocytes,Ua TRACE (*)    All other components within normal limits  URINE CULTURE  CBC WITH DIFFERENTIAL/PLATELET      Procedures Procedures   Medications Ordered in ED Medications - No data to display  ED Course  I have reviewed the triage vital signs and the nursing notes.  Pertinent labs & imaging results that were available during my care of the patient were reviewed by me and considered in my medical decision making (see chart for details).  Paper chart from his 50 office notable for Pseudomonas aeruginosa positive urine culture from 1 week ago with sensitivity report. 4:47 PM Patient in no distress, resting in left lateral decubitus position. Labs reviewed, including urinalysis.  Urinalysis largely reassuring, no leukocytosis, no fever, the patient has no ongoing complaints.  I we discussed  implications of his generally reassuring findings, with abnormal urine culture from last week. With no evidence for bacteremia, sepsis, and evidence for improvement with ongoing antibiotic therapy patient was encouraged to continue this, to follow-up with primary care.  Urine culture was sent again today. Final Clinical Impression(s) / ED Diagnoses Final diagnoses:  Lower urinary tract infectious disease    Rx / DC Orders ED Discharge Orders     None        Carmin Muskrat, MD 12/20/20 1648

## 2020-12-20 NOTE — Discharge Instructions (Signed)
Your evaluation today has been reassuring.  Although your urine culture from last week suggested an infection that may not respond to antibiotics, today's labs and urinalysis are reassuring.  Please continue taking your antibiotics until completion and follow-up with your physician as needed.

## 2020-12-20 NOTE — ED Notes (Signed)
ED Provider at bedside. 

## 2020-12-22 LAB — URINE CULTURE: Culture: NO GROWTH

## 2021-01-02 DIAGNOSIS — J441 Chronic obstructive pulmonary disease with (acute) exacerbation: Secondary | ICD-10-CM | POA: Diagnosis not present

## 2021-01-04 DIAGNOSIS — R350 Frequency of micturition: Secondary | ICD-10-CM | POA: Diagnosis not present

## 2021-01-09 DIAGNOSIS — J441 Chronic obstructive pulmonary disease with (acute) exacerbation: Secondary | ICD-10-CM | POA: Diagnosis not present

## 2021-01-09 DIAGNOSIS — I10 Essential (primary) hypertension: Secondary | ICD-10-CM | POA: Diagnosis not present

## 2021-01-09 DIAGNOSIS — N1832 Chronic kidney disease, stage 3b: Secondary | ICD-10-CM | POA: Diagnosis not present

## 2021-01-09 DIAGNOSIS — E7800 Pure hypercholesterolemia, unspecified: Secondary | ICD-10-CM | POA: Diagnosis not present

## 2021-01-09 DIAGNOSIS — N400 Enlarged prostate without lower urinary tract symptoms: Secondary | ICD-10-CM | POA: Diagnosis not present

## 2021-01-09 DIAGNOSIS — J449 Chronic obstructive pulmonary disease, unspecified: Secondary | ICD-10-CM | POA: Diagnosis not present

## 2021-01-09 DIAGNOSIS — K219 Gastro-esophageal reflux disease without esophagitis: Secondary | ICD-10-CM | POA: Diagnosis not present

## 2021-01-09 DIAGNOSIS — I2510 Atherosclerotic heart disease of native coronary artery without angina pectoris: Secondary | ICD-10-CM | POA: Diagnosis not present

## 2021-01-09 DIAGNOSIS — M1990 Unspecified osteoarthritis, unspecified site: Secondary | ICD-10-CM | POA: Diagnosis not present

## 2021-01-12 DIAGNOSIS — N390 Urinary tract infection, site not specified: Secondary | ICD-10-CM | POA: Diagnosis not present

## 2021-01-12 DIAGNOSIS — N400 Enlarged prostate without lower urinary tract symptoms: Secondary | ICD-10-CM | POA: Diagnosis not present

## 2021-01-12 DIAGNOSIS — I2510 Atherosclerotic heart disease of native coronary artery without angina pectoris: Secondary | ICD-10-CM | POA: Diagnosis not present

## 2021-01-12 DIAGNOSIS — I10 Essential (primary) hypertension: Secondary | ICD-10-CM | POA: Diagnosis not present

## 2021-01-12 DIAGNOSIS — M1990 Unspecified osteoarthritis, unspecified site: Secondary | ICD-10-CM | POA: Diagnosis not present

## 2021-01-12 DIAGNOSIS — N1832 Chronic kidney disease, stage 3b: Secondary | ICD-10-CM | POA: Diagnosis not present

## 2021-01-12 DIAGNOSIS — K219 Gastro-esophageal reflux disease without esophagitis: Secondary | ICD-10-CM | POA: Diagnosis not present

## 2021-01-12 DIAGNOSIS — E7800 Pure hypercholesterolemia, unspecified: Secondary | ICD-10-CM | POA: Diagnosis not present

## 2021-01-12 DIAGNOSIS — J449 Chronic obstructive pulmonary disease, unspecified: Secondary | ICD-10-CM | POA: Diagnosis not present

## 2021-01-31 DIAGNOSIS — N183 Chronic kidney disease, stage 3 unspecified: Secondary | ICD-10-CM | POA: Diagnosis not present

## 2021-02-02 DIAGNOSIS — J441 Chronic obstructive pulmonary disease with (acute) exacerbation: Secondary | ICD-10-CM | POA: Diagnosis not present

## 2021-02-09 DIAGNOSIS — I129 Hypertensive chronic kidney disease with stage 1 through stage 4 chronic kidney disease, or unspecified chronic kidney disease: Secondary | ICD-10-CM | POA: Diagnosis not present

## 2021-02-09 DIAGNOSIS — N183 Chronic kidney disease, stage 3 unspecified: Secondary | ICD-10-CM | POA: Diagnosis not present

## 2021-02-26 DIAGNOSIS — E7800 Pure hypercholesterolemia, unspecified: Secondary | ICD-10-CM | POA: Diagnosis not present

## 2021-02-26 DIAGNOSIS — J441 Chronic obstructive pulmonary disease with (acute) exacerbation: Secondary | ICD-10-CM | POA: Diagnosis not present

## 2021-02-26 DIAGNOSIS — N400 Enlarged prostate without lower urinary tract symptoms: Secondary | ICD-10-CM | POA: Diagnosis not present

## 2021-02-26 DIAGNOSIS — N183 Chronic kidney disease, stage 3 unspecified: Secondary | ICD-10-CM | POA: Diagnosis not present

## 2021-02-26 DIAGNOSIS — J449 Chronic obstructive pulmonary disease, unspecified: Secondary | ICD-10-CM | POA: Diagnosis not present

## 2021-02-26 DIAGNOSIS — K219 Gastro-esophageal reflux disease without esophagitis: Secondary | ICD-10-CM | POA: Diagnosis not present

## 2021-02-26 DIAGNOSIS — I10 Essential (primary) hypertension: Secondary | ICD-10-CM | POA: Diagnosis not present

## 2021-02-26 DIAGNOSIS — I2510 Atherosclerotic heart disease of native coronary artery without angina pectoris: Secondary | ICD-10-CM | POA: Diagnosis not present

## 2021-02-26 DIAGNOSIS — M1990 Unspecified osteoarthritis, unspecified site: Secondary | ICD-10-CM | POA: Diagnosis not present

## 2021-03-05 DIAGNOSIS — J441 Chronic obstructive pulmonary disease with (acute) exacerbation: Secondary | ICD-10-CM | POA: Diagnosis not present

## 2021-03-16 DIAGNOSIS — N1832 Chronic kidney disease, stage 3b: Secondary | ICD-10-CM | POA: Diagnosis not present

## 2021-03-16 DIAGNOSIS — E7800 Pure hypercholesterolemia, unspecified: Secondary | ICD-10-CM | POA: Diagnosis not present

## 2021-03-16 DIAGNOSIS — J449 Chronic obstructive pulmonary disease, unspecified: Secondary | ICD-10-CM | POA: Diagnosis not present

## 2021-03-16 DIAGNOSIS — I2510 Atherosclerotic heart disease of native coronary artery without angina pectoris: Secondary | ICD-10-CM | POA: Diagnosis not present

## 2021-03-16 DIAGNOSIS — I10 Essential (primary) hypertension: Secondary | ICD-10-CM | POA: Diagnosis not present

## 2021-03-16 DIAGNOSIS — J441 Chronic obstructive pulmonary disease with (acute) exacerbation: Secondary | ICD-10-CM | POA: Diagnosis not present

## 2021-03-16 DIAGNOSIS — N189 Chronic kidney disease, unspecified: Secondary | ICD-10-CM | POA: Diagnosis not present

## 2021-03-16 DIAGNOSIS — K219 Gastro-esophageal reflux disease without esophagitis: Secondary | ICD-10-CM | POA: Diagnosis not present

## 2021-03-18 ENCOUNTER — Other Ambulatory Visit: Payer: Self-pay

## 2021-03-18 ENCOUNTER — Inpatient Hospital Stay (HOSPITAL_COMMUNITY)
Admission: EM | Admit: 2021-03-18 | Discharge: 2021-03-21 | DRG: 190 | Disposition: A | Discharge status: Home / Self Care | Payer: No Typology Code available for payment source | Attending: Internal Medicine | Admitting: Internal Medicine

## 2021-03-18 ENCOUNTER — Encounter (HOSPITAL_COMMUNITY): Payer: Self-pay

## 2021-03-18 ENCOUNTER — Emergency Department (HOSPITAL_COMMUNITY): Payer: No Typology Code available for payment source

## 2021-03-18 DIAGNOSIS — I2584 Coronary atherosclerosis due to calcified coronary lesion: Secondary | ICD-10-CM | POA: Diagnosis present

## 2021-03-18 DIAGNOSIS — W19XXXA Unspecified fall, initial encounter: Secondary | ICD-10-CM | POA: Diagnosis not present

## 2021-03-18 DIAGNOSIS — Z85528 Personal history of other malignant neoplasm of kidney: Secondary | ICD-10-CM

## 2021-03-18 DIAGNOSIS — E1159 Type 2 diabetes mellitus with other circulatory complications: Secondary | ICD-10-CM | POA: Diagnosis present

## 2021-03-18 DIAGNOSIS — I10 Essential (primary) hypertension: Secondary | ICD-10-CM | POA: Diagnosis not present

## 2021-03-18 DIAGNOSIS — R972 Elevated prostate specific antigen [PSA]: Secondary | ICD-10-CM | POA: Diagnosis present

## 2021-03-18 DIAGNOSIS — E875 Hyperkalemia: Secondary | ICD-10-CM | POA: Diagnosis present

## 2021-03-18 DIAGNOSIS — G4733 Obstructive sleep apnea (adult) (pediatric): Secondary | ICD-10-CM | POA: Diagnosis present

## 2021-03-18 DIAGNOSIS — I700 Atherosclerosis of aorta: Secondary | ICD-10-CM | POA: Diagnosis present

## 2021-03-18 DIAGNOSIS — F17210 Nicotine dependence, cigarettes, uncomplicated: Secondary | ICD-10-CM | POA: Diagnosis present

## 2021-03-18 DIAGNOSIS — Z79899 Other long term (current) drug therapy: Secondary | ICD-10-CM

## 2021-03-18 DIAGNOSIS — Z9981 Dependence on supplemental oxygen: Secondary | ICD-10-CM

## 2021-03-18 DIAGNOSIS — N1831 Chronic kidney disease, stage 3a: Secondary | ICD-10-CM | POA: Diagnosis present

## 2021-03-18 DIAGNOSIS — I130 Hypertensive heart and chronic kidney disease with heart failure and stage 1 through stage 4 chronic kidney disease, or unspecified chronic kidney disease: Secondary | ICD-10-CM | POA: Diagnosis present

## 2021-03-18 DIAGNOSIS — N400 Enlarged prostate without lower urinary tract symptoms: Secondary | ICD-10-CM | POA: Diagnosis present

## 2021-03-18 DIAGNOSIS — J439 Emphysema, unspecified: Secondary | ICD-10-CM | POA: Diagnosis not present

## 2021-03-18 DIAGNOSIS — I5032 Chronic diastolic (congestive) heart failure: Secondary | ICD-10-CM | POA: Diagnosis present

## 2021-03-18 DIAGNOSIS — J441 Chronic obstructive pulmonary disease with (acute) exacerbation: Secondary | ICD-10-CM | POA: Diagnosis not present

## 2021-03-18 DIAGNOSIS — I5189 Other ill-defined heart diseases: Secondary | ICD-10-CM

## 2021-03-18 DIAGNOSIS — Z86711 Personal history of pulmonary embolism: Secondary | ICD-10-CM

## 2021-03-18 DIAGNOSIS — Z905 Acquired absence of kidney: Secondary | ICD-10-CM

## 2021-03-18 DIAGNOSIS — E119 Type 2 diabetes mellitus without complications: Secondary | ICD-10-CM

## 2021-03-18 DIAGNOSIS — Z86718 Personal history of other venous thrombosis and embolism: Secondary | ICD-10-CM

## 2021-03-18 DIAGNOSIS — J449 Chronic obstructive pulmonary disease, unspecified: Secondary | ICD-10-CM | POA: Diagnosis present

## 2021-03-18 DIAGNOSIS — G4730 Sleep apnea, unspecified: Secondary | ICD-10-CM | POA: Diagnosis present

## 2021-03-18 DIAGNOSIS — I2510 Atherosclerotic heart disease of native coronary artery without angina pectoris: Secondary | ICD-10-CM | POA: Diagnosis present

## 2021-03-18 DIAGNOSIS — R0689 Other abnormalities of breathing: Secondary | ICD-10-CM | POA: Diagnosis not present

## 2021-03-18 DIAGNOSIS — K219 Gastro-esophageal reflux disease without esophagitis: Secondary | ICD-10-CM | POA: Diagnosis present

## 2021-03-18 DIAGNOSIS — E1122 Type 2 diabetes mellitus with diabetic chronic kidney disease: Secondary | ICD-10-CM | POA: Diagnosis present

## 2021-03-18 DIAGNOSIS — Z7901 Long term (current) use of anticoagulants: Secondary | ICD-10-CM

## 2021-03-18 DIAGNOSIS — E785 Hyperlipidemia, unspecified: Secondary | ICD-10-CM | POA: Diagnosis present

## 2021-03-18 DIAGNOSIS — M109 Gout, unspecified: Secondary | ICD-10-CM | POA: Diagnosis present

## 2021-03-18 DIAGNOSIS — R069 Unspecified abnormalities of breathing: Secondary | ICD-10-CM | POA: Diagnosis not present

## 2021-03-18 DIAGNOSIS — N183 Chronic kidney disease, stage 3 unspecified: Secondary | ICD-10-CM | POA: Diagnosis present

## 2021-03-18 DIAGNOSIS — Z833 Family history of diabetes mellitus: Secondary | ICD-10-CM

## 2021-03-18 DIAGNOSIS — R062 Wheezing: Secondary | ICD-10-CM | POA: Diagnosis not present

## 2021-03-18 DIAGNOSIS — Z20822 Contact with and (suspected) exposure to covid-19: Secondary | ICD-10-CM | POA: Diagnosis present

## 2021-03-18 DIAGNOSIS — Z8249 Family history of ischemic heart disease and other diseases of the circulatory system: Secondary | ICD-10-CM

## 2021-03-18 DIAGNOSIS — J9621 Acute and chronic respiratory failure with hypoxia: Secondary | ICD-10-CM | POA: Diagnosis present

## 2021-03-18 DIAGNOSIS — J309 Allergic rhinitis, unspecified: Secondary | ICD-10-CM | POA: Diagnosis present

## 2021-03-18 DIAGNOSIS — Z7951 Long term (current) use of inhaled steroids: Secondary | ICD-10-CM

## 2021-03-18 DIAGNOSIS — N179 Acute kidney failure, unspecified: Secondary | ICD-10-CM | POA: Diagnosis present

## 2021-03-18 MED ORDER — ALBUTEROL (5 MG/ML) CONTINUOUS INHALATION SOLN
10.0000 mg/h | INHALATION_SOLUTION | RESPIRATORY_TRACT | Status: DC
  Filled 2021-03-18: qty 20

## 2021-03-18 MED ORDER — ALBUTEROL SULFATE (2.5 MG/3ML) 0.083% IN NEBU
10.0000 mg | INHALATION_SOLUTION | Freq: Once | RESPIRATORY_TRACT | Status: AC
  Administered 2021-03-18: 10 mg via RESPIRATORY_TRACT

## 2021-03-18 NOTE — ED Triage Notes (Signed)
Patient BIB [facility] from home. COPD exacerbation, wheezing throughout, patient uses 2L [place] baseline, EMS placed him on 10L with the neb mask.   EMS vitals 10mg  albuterol 1mg  atrovent 125mg  solumedrol  20G left forearm

## 2021-03-18 NOTE — ED Provider Notes (Signed)
[place] DEPT Provider Note: Georgena Spurling, MD, FACEP  CSN: [id number] MRN: [id number] ARRIVAL: 03/18/21 at 2158 ROOM: WA04/WA04   CHIEF COMPLAINT  Shortness of Breath   HISTORY OF PRESENT ILLNESS  03/18/21 11:04 PM Gregory Christensen is a 70 y.o. male with COPD.  He is here with shortness of breath and chest tightness that began about 5:30 PM.  He states the symptoms were severe and not relieved with his home albuterol.  He finally called EMS and in route they provided 10 mg of albuterol and 1 mg of Atrovent by neb.  He also received 125 mg of Solu-Medrol IV.  He states that there was improvement with this but he still is feeling chest tightness and having wheezing.  He also had a productive cough but denies fever.   Past Medical History:  Diagnosis Date   Acute deep vein thrombosis (DVT) of left lower extremity (HCC)    Acute hypoxemic respiratory failure (HCC)    Anemia    Iron deficiency   Anxiety    Aortic atherosclerosis (HCC)    Arthritis    Benign prostatic hyperplasia 08/28/2018   UNSPECIFIED WHETHER LOWER URINARY TRACT SYMPTOMS PRESENT   CAD (coronary artery disease)    Cancer (HCC)    Chronic back pain    Chronic hip pain    Constipation    due to Iron M-W-F   COPD exacerbation ([place]) 10/18/2017   Dyspnea    Emphysema lung (HCC)    Essential hypertension    GERD (gastroesophageal reflux disease)    Gout    Gross hematuria 09/08/2018   Head injury, acute, with loss of consciousness ([place])    unsure how long he was unresponsive   History of blood transfusion 05/2019   History of kidney stones    History of tobacco abuse    Hypoxia    Insomnia    Oxygen dependent    2l- 24/7   Pneumonia    in 1980's   Pre-diabetes    Pulmonary embolism ([place]) 05/31/2018   Renal mass    CONCERNING FOR RENAL CELL CARCINOMA DR. Lovena Neighbours   Respiratory distress    Respiratory failure, acute-on-chronic ([place]) 08/03/2018   Seasonal allergies    Sleep apnea     Past Surgical  History:  Procedure Laterality Date   COLONOSCOPY WITH PROPOFOL N/A 09/27/2019   Procedure: COLONOSCOPY WITH PROPOFOL;  Surgeon: Irene Shipper, MD;  Location: [facility];  Service: Endoscopy;  Laterality: N/A;   CYSTOSCOPY/URETEROSCOPY/HOLMIUM LASER/STENT PLACEMENT Left 10/31/2018   Procedure: CYSTOSCOPY/URETEROSCOPY;  Surgeon: Ceasar Mons, MD;  Location: [facility];  Service: Urology;  Laterality: Left;   ESOPHAGOGASTRODUODENOSCOPY (EGD) WITH PROPOFOL N/A 09/27/2019   Procedure: ESOPHAGOGASTRODUODENOSCOPY (EGD) WITH PROPOFOL;  Surgeon: Irene Shipper, MD;  Location: [facility];  Service: Endoscopy;  Laterality: N/A;   HERNIA REPAIR Left 1977   inguinal   HYDROCELE EXCISION Left 05/13/2019   Procedure: HYDROCELECTOMY ADULT;  Surgeon: Ceasar Mons, MD;  Location: [facility];  Service: Urology;  Laterality: Left;   [place]   EXTENSIVE   POLYPECTOMY  09/27/2019   Procedure: POLYPECTOMY;  Surgeon: Irene Shipper, MD;  Location: [facility];  Service: Endoscopy;;   ROBOT ASSITED LAPAROSCOPIC NEPHROURETERECTOMY Left 10/31/2018   Procedure: XI ROBOT ASSITED LAPAROSCOPIC NEPHROURETERECTOMY;  Surgeon: Ceasar Mons, MD;  Location: [facility];  Service: Urology;  Laterality: Left;  ONLY NEEDS 240 MIN FOR ALL PROCEDURES   SHOULDER ARTHROSCOPY WITH ROTATOR CUFF REPAIR Left 04/12/2020  Procedure: LEFT SHOULDER ARTHROSCOPY, DEBRIDEMENT, BICEPS TENODESIS, MINI OPEN ROTATOR CUFF TEAR REPAIR;  Surgeon: Meredith Pel, MD;  Location: [facility];  Service: Orthopedics;  Laterality: Left;    Family History  Problem Relation Age of Onset   Diabetes Mother    Hypertension Sister    Hypertension Brother     Social History   Tobacco Use   Smoking status: Former    Packs/day: 0.50    Years: 57.00    Pack years: 28.50    Types: Cigarettes   Smokeless tobacco: Never  Vaping Use   Vaping Use: Never used  Substance Use Topics   Alcohol use: Not Currently     Alcohol/week: 10.0 standard drinks    Types: 10 Glasses of wine per week    Comment: 0-2 beers a day    Drug use: Not Currently    Types: Flunitrazepam    Prior to Admission medications   Medication Sig Start Date End Date Taking? Authorizing Provider  albuterol (PROVENTIL HFA;VENTOLIN HFA) 108 (90 Base) MCG/ACT inhaler Inhale 2 puffs into the lungs every 2 (two) hours as needed for wheezing or shortness of breath. 08/04/18   Reyne Dumas, MD  albuterol (PROVENTIL) (2.5 MG/3ML) 0.083% nebulizer solution Take 3 mLs (2.5 mg total) by nebulization every 6 (six) hours as needed for wheezing or shortness of breath. 08/04/18   Reyne Dumas, MD  amLODipine (NORVASC) 2.5 MG tablet Take 2.5 mg by mouth daily.    [provider]  amoxicillin-clavulanate (AUGMENTIN) 875-125 MG tablet Take 1 tablet by mouth 2 (two) times daily. Start date : 12/12/20 12/12/20   [provider]  apixaban (ELIQUIS) 5 MG TABS tablet Take 1 tablet (5 mg total) by mouth 2 (two) times daily. 06/10/18   Regalado, Belkys A, MD  budesonide-formoterol (SYMBICORT) 160-4.5 MCG/ACT inhaler Inhale 1 puff into the lungs 2 (two) times daily. Patient not taking: Reported on 12/20/2020 08/04/18   Reyne Dumas, MD  cephALEXin (KEFLEX) 500 MG capsule Take 1 capsule (500 mg total) by mouth 2 (two) times daily. Patient not taking: No sig reported 04/13/20   Orpah Greek, MD  Dextran 70-Hypromellose (ARTIFICIAL TEARS) 0.1-0.3 % SOLN Place 1 drop into both eyes daily as needed (dry eyes).    [provider]  diclofenac Sodium (VOLTAREN) 1 % GEL Apply 2 g topically 2 (two) times daily as needed (Pain).    [provider]  diphenhydrAMINE (SOMINEX) 25 MG tablet Take 25 mg by mouth daily as needed for itching. 02/15/16   [provider]  docusate sodium (COLACE) 100 MG capsule Take 100 mg by mouth daily as needed for mild constipation.    [provider]  doxycycline (VIBRAMYCIN) 100 MG  capsule Take 1 capsule (100 mg total) by mouth 2 (two) times daily. Patient not taking: No sig reported 05/07/20   Orpah Greek, MD  famotidine (PEPCID) 20 MG tablet Take 20 mg by mouth daily.    [provider]  ferrous sulfate 325 (65 FE) MG tablet Take 325 mg by mouth See admin instructions. Takes 1 tablet on Monday , Wednesday and Friday    [provider]  fluticasone (FLONASE) 50 MCG/ACT nasal spray Place 1 spray into both nostrils 2 (two) times daily. Patient taking differently: Place 1 spray into both nostrils daily as needed for allergies. 08/04/18   Reyne Dumas, MD  fluticasone-salmeterol (ADVAIR) 100-50 MCG/ACT AEPB Inhale 1 puff into the lungs 2 (two) times daily.    [provider]  gabapentin (NEURONTIN) 300 MG capsule Take 300 mg by mouth See admin instructions. Takes 1 capsule in the morning and 2 capsule at night    [provider]  HYDROcodone-acetaminophen (NORCO/VICODIN) 5-325 MG tablet Take 1 tablet by mouth daily as needed for moderate pain. Patient not taking: No sig reported 06/07/20 06/07/21  Magnant, Gerrianne Scale, PA-C  hydroxyprophyl methylcellulose / hypromellose (ISOPTO TEARS) 0.5 % opthalmic solution See admin instructions. Patient not taking: No sig reported    [provider]  hydroxypropyl methylcellulose / hypromellose (ISOPTO TEARS / GONIOVISC) 2.5 % ophthalmic solution Place 1 drop into both eyes 3 (three) times daily as needed for dry eyes. Patient not taking: Reported on 12/20/2020    [provider]  levocetirizine (XYZAL) 5 MG tablet Take 5 mg by mouth daily.    [provider]  lidocaine (LIDODERM) 5 % Place 1 patch onto the skin every 12 (twelve) hours as needed.    [provider]  methocarbamol (ROBAXIN) 500 MG tablet Take 1 tablet (500 mg total) by mouth every 8 (eight) hours as needed. Patient not taking: No sig reported 06/07/20   Magnant, Charles L, PA-C  predniSONE (DELTASONE)  20 MG tablet Take 2 tablets (40 mg total) by mouth daily with breakfast. Patient not taking: No sig reported 06/20/20   Truddie Hidden, MD  sildenafil (VIAGRA) 100 MG tablet Take 100 mg by mouth daily as needed for erectile dysfunction. 01/25/20   [provider]  tamsulosin (FLOMAX) 0.4 MG CAPS capsule Take 0.4 mg by mouth daily.    [provider]  Tiotropium Bromide Monohydrate (SPIRIVA RESPIMAT) 2.5 MCG/ACT AERS Inhale 2 puffs into the lungs daily.    [provider]    Allergies Patient has no known allergies.   REVIEW OF SYSTEMS  Negative except as noted here or in the History of Present Illness.   PHYSICAL EXAMINATION  Initial Vital Signs Blood pressure 102/82, pulse 89, resp. rate (!) 23, SpO2 98 %.  Examination General: Well-developed, well-nourished male in no acute distress; appearance consistent with age of record HENT: normocephalic; atraumatic Eyes: pupils equal, round and reactive to light; extraocular muscles intact Neck: supple Heart: regular rate and rhythm Lungs: Expiratory stridor otherwise distant sounds Abdomen: soft; nondistended; nontender; bowel sounds present Extremities: No deformity; full range of motion; pulses normal Neurologic: Awake, alert and oriented; motor function intact in all extremities and symmetric; no facial droop Skin: Warm and dry Psychiatric: Normal mood and affect   RESULTS  Summary of this visit's results, reviewed and interpreted by myself:   EKG Interpretation  Date/Time:  Sunday March 19 2021 00:02:46 EDT Ventricular Rate:  82 PR Interval:  154 QRS Duration: 84 QT Interval:  396 QTC Calculation: 463 R Axis:   53 Text Interpretation: Sinus rhythm Borderline T abnormalities, anterior leads No significant change was found Confirmed by Shanon Rosser [phone]) on 03/19/2021 12:06:40 AM       Laboratory Studies: Results for orders placed or performed during the hospital encounter of 03/18/21  (from the past 24 hour(s))  CBC with Differential/Platelet     Status: None   Collection Time: 03/19/21 12:06 AM  Result Value Ref Range   WBC 6.4 4.0 - 10.5 K/uL   RBC 4.72 4.22 - 5.81 MIL/uL   Hemoglobin 13.3 13.0 - 17.0 g/dL   HCT 42.2 39.0 - 52.0 %   MCV 89.4 80.0 - 100.0 fL   MCH 28.2 26.0 - 34.0 pg   MCHC  31.5 30.0 - 36.0 g/dL   RDW 14.1 11.5 - 15.5 %   Platelets 204 150 - 400 K/uL   nRBC 0.0 0.0 - 0.2 %   Neutrophils Relative % 83 %   Neutro Abs 5.3 1.7 - 7.7 K/uL   Lymphocytes Relative 13 %   Lymphs Abs 0.8 0.7 - 4.0 K/uL   Monocytes Relative 3 %   Monocytes Absolute 0.2 0.1 - 1.0 K/uL   Eosinophils Relative 1 %   Eosinophils Absolute 0.0 0.0 - 0.5 K/uL   Basophils Relative 0 %   Basophils Absolute 0.0 0.0 - 0.1 K/uL   Immature Granulocytes 0 %   Abs Immature Granulocytes 0.02 0.00 - 0.07 K/uL  Basic metabolic panel     Status: Abnormal   Collection Time: 03/19/21 12:06 AM  Result Value Ref Range   Sodium 138 135 - 145 mmol/L   Potassium 4.4 3.5 - 5.1 mmol/L   Chloride 108 98 - 111 mmol/L   CO2 22 22 - 32 mmol/L   Glucose, Bld 112 (H) 70 - 99 mg/dL   BUN 13 8 - 23 mg/dL   Creatinine, Ser 1.67 (H) 0.61 - 1.24 mg/dL   Calcium 9.2 8.9 - 10.3 mg/dL   GFR, Estimated 44 (L) >60 mL/min   Anion gap 8 5 - 15  Troponin I (High Sensitivity)     Status: None   Collection Time: 03/19/21 12:06 AM  Result Value Ref Range   Troponin I (High Sensitivity) 4 <18 ng/L   Imaging Studies: DG Chest 2 View  Result Date: 03/18/2021 CLINICAL DATA:  Hx of CAD, COPD, emphysema, pna, PE, ex smoker. EXAM: CHEST - 2 VIEW COMPARISON:  Cxr 06/20/20 FINDINGS: The heart and mediastinal contours are within normal limits. Atherosclerotic plaque. Slightly more lucent right mid lung zone likely due to patient positioning. No focal consolidation. No pulmonary edema. No pleural effusion. No pneumothorax. No acute osseous abnormality. IMPRESSION: 1. No active cardiopulmonary disease. 2. Aortic  Atherosclerosis (ICD10-I70.0) and Emphysema (ICD10-J43.9). Electronically Signed   By: Iven Finn M.D.   On: 03/18/2021 23:57    ED COURSE and MDM  Nursing notes, initial and subsequent vitals signs, including pulse oximetry, reviewed and interpreted by myself.  Vitals:   03/18/21 2230 03/18/21 2300 03/18/21 2343 03/19/21 0000  BP: (!) 147/85 102/82  133/90  Pulse: 82 89  83  Resp: 19 (!) 23  (!) 21  Temp:    97.7 F (36.5 C)  TempSrc:    Oral  SpO2: 100% 98% 96% 99%   Medications  albuterol (PROVENTIL) (2.5 MG/3ML) 0.083% nebulizer solution 10 mg (10 mg Nebulization Given 03/18/21 2339)   1:29 AM Patient is still dyspneic with some expiratory stridor after continuous albuterol neb treatment.  We will have patient admitted.  2:18 AM Dr. Olevia Bowens to admit to hospitalist service.   PROCEDURES  Procedures   ED DIAGNOSES     ICD-10-CM   1. COPD exacerbation ([place])  J44.1          Smt Lokey, MD 03/19/21 [phone]

## 2021-03-19 ENCOUNTER — Encounter (HOSPITAL_COMMUNITY): Payer: Self-pay | Admitting: Internal Medicine

## 2021-03-19 DIAGNOSIS — N138 Other obstructive and reflux uropathy: Secondary | ICD-10-CM | POA: Insufficient documentation

## 2021-03-19 DIAGNOSIS — G4730 Sleep apnea, unspecified: Secondary | ICD-10-CM | POA: Diagnosis present

## 2021-03-19 DIAGNOSIS — Z20822 Contact with and (suspected) exposure to covid-19: Secondary | ICD-10-CM | POA: Diagnosis present

## 2021-03-19 DIAGNOSIS — Z86711 Personal history of pulmonary embolism: Secondary | ICD-10-CM | POA: Diagnosis not present

## 2021-03-19 DIAGNOSIS — F17210 Nicotine dependence, cigarettes, uncomplicated: Secondary | ICD-10-CM | POA: Diagnosis present

## 2021-03-19 DIAGNOSIS — N183 Chronic kidney disease, stage 3 unspecified: Secondary | ICD-10-CM

## 2021-03-19 DIAGNOSIS — Z9981 Dependence on supplemental oxygen: Secondary | ICD-10-CM | POA: Diagnosis not present

## 2021-03-19 DIAGNOSIS — E785 Hyperlipidemia, unspecified: Secondary | ICD-10-CM | POA: Diagnosis present

## 2021-03-19 DIAGNOSIS — Z85528 Personal history of other malignant neoplasm of kidney: Secondary | ICD-10-CM | POA: Diagnosis not present

## 2021-03-19 DIAGNOSIS — N179 Acute kidney failure, unspecified: Secondary | ICD-10-CM | POA: Diagnosis present

## 2021-03-19 DIAGNOSIS — Z7901 Long term (current) use of anticoagulants: Secondary | ICD-10-CM

## 2021-03-19 DIAGNOSIS — Z86718 Personal history of other venous thrombosis and embolism: Secondary | ICD-10-CM | POA: Diagnosis not present

## 2021-03-19 DIAGNOSIS — I10 Essential (primary) hypertension: Secondary | ICD-10-CM | POA: Diagnosis not present

## 2021-03-19 DIAGNOSIS — K219 Gastro-esophageal reflux disease without esophagitis: Secondary | ICD-10-CM | POA: Diagnosis present

## 2021-03-19 DIAGNOSIS — J439 Emphysema, unspecified: Secondary | ICD-10-CM | POA: Diagnosis present

## 2021-03-19 DIAGNOSIS — I5032 Chronic diastolic (congestive) heart failure: Secondary | ICD-10-CM | POA: Diagnosis present

## 2021-03-19 DIAGNOSIS — J309 Allergic rhinitis, unspecified: Secondary | ICD-10-CM | POA: Diagnosis present

## 2021-03-19 DIAGNOSIS — G4733 Obstructive sleep apnea (adult) (pediatric): Secondary | ICD-10-CM | POA: Diagnosis present

## 2021-03-19 DIAGNOSIS — I130 Hypertensive heart and chronic kidney disease with heart failure and stage 1 through stage 4 chronic kidney disease, or unspecified chronic kidney disease: Secondary | ICD-10-CM | POA: Diagnosis present

## 2021-03-19 DIAGNOSIS — I700 Atherosclerosis of aorta: Secondary | ICD-10-CM | POA: Diagnosis present

## 2021-03-19 DIAGNOSIS — J441 Chronic obstructive pulmonary disease with (acute) exacerbation: Secondary | ICD-10-CM | POA: Diagnosis present

## 2021-03-19 DIAGNOSIS — R972 Elevated prostate specific antigen [PSA]: Secondary | ICD-10-CM

## 2021-03-19 DIAGNOSIS — Z905 Acquired absence of kidney: Secondary | ICD-10-CM | POA: Diagnosis not present

## 2021-03-19 DIAGNOSIS — E1169 Type 2 diabetes mellitus with other specified complication: Secondary | ICD-10-CM

## 2021-03-19 DIAGNOSIS — M109 Gout, unspecified: Secondary | ICD-10-CM | POA: Diagnosis present

## 2021-03-19 DIAGNOSIS — N1831 Chronic kidney disease, stage 3a: Secondary | ICD-10-CM | POA: Diagnosis present

## 2021-03-19 DIAGNOSIS — E119 Type 2 diabetes mellitus without complications: Secondary | ICD-10-CM

## 2021-03-19 DIAGNOSIS — I2510 Atherosclerotic heart disease of native coronary artery without angina pectoris: Secondary | ICD-10-CM | POA: Diagnosis present

## 2021-03-19 DIAGNOSIS — N400 Enlarged prostate without lower urinary tract symptoms: Secondary | ICD-10-CM | POA: Diagnosis present

## 2021-03-19 DIAGNOSIS — E1122 Type 2 diabetes mellitus with diabetic chronic kidney disease: Secondary | ICD-10-CM | POA: Diagnosis present

## 2021-03-19 DIAGNOSIS — J449 Chronic obstructive pulmonary disease, unspecified: Secondary | ICD-10-CM | POA: Diagnosis present

## 2021-03-19 DIAGNOSIS — R0609 Other forms of dyspnea: Secondary | ICD-10-CM | POA: Diagnosis not present

## 2021-03-19 DIAGNOSIS — N401 Enlarged prostate with lower urinary tract symptoms: Secondary | ICD-10-CM | POA: Insufficient documentation

## 2021-03-19 DIAGNOSIS — Z8249 Family history of ischemic heart disease and other diseases of the circulatory system: Secondary | ICD-10-CM | POA: Diagnosis not present

## 2021-03-19 DIAGNOSIS — I5189 Other ill-defined heart diseases: Secondary | ICD-10-CM

## 2021-03-19 DIAGNOSIS — J9621 Acute and chronic respiratory failure with hypoxia: Secondary | ICD-10-CM | POA: Diagnosis present

## 2021-03-19 HISTORY — DX: Chronic kidney disease, stage 3 unspecified: N18.30

## 2021-03-19 LAB — GLUCOSE, CAPILLARY: Glucose-Capillary: 159 mg/dL — ABNORMAL HIGH (ref 70–99)

## 2021-03-19 LAB — CBC WITH DIFFERENTIAL/PLATELET
Abs Immature Granulocytes: 0.02 10*3/uL (ref 0.00–0.07)
Basophils Absolute: 0 10*3/uL (ref 0.0–0.1)
Basophils Relative: 0 %
Eosinophils Absolute: 0 10*3/uL (ref 0.0–0.5)
Eosinophils Relative: 1 %
HCT: 42.2 % (ref 39.0–52.0)
Hemoglobin: 13.3 g/dL (ref 13.0–17.0)
Immature Granulocytes: 0 %
Lymphocytes Relative: 13 %
Lymphs Abs: 0.8 10*3/uL (ref 0.7–4.0)
MCH: 28.2 pg (ref 26.0–34.0)
MCHC: 31.5 g/dL (ref 30.0–36.0)
MCV: 89.4 fL (ref 80.0–100.0)
Monocytes Absolute: 0.2 10*3/uL (ref 0.1–1.0)
Monocytes Relative: 3 %
Neutro Abs: 5.3 10*3/uL (ref 1.7–7.7)
Neutrophils Relative %: 83 %
Platelets: 204 10*3/uL (ref 150–400)
RBC: 4.72 MIL/uL (ref 4.22–5.81)
RDW: 14.1 % (ref 11.5–15.5)
WBC: 6.4 10*3/uL (ref 4.0–10.5)
nRBC: 0 % (ref 0.0–0.2)

## 2021-03-19 LAB — TROPONIN I (HIGH SENSITIVITY)
Troponin I (High Sensitivity): 4 ng/L (ref <18)
Troponin I (High Sensitivity): 3 ng/L (ref <18)

## 2021-03-19 LAB — BASIC METABOLIC PANEL
Anion gap: 8 (ref 5–15)
BUN: 13 mg/dL (ref 8–23)
CO2: 22 mmol/L (ref 22–32)
Calcium: 9.2 mg/dL (ref 8.9–10.3)
Chloride: 108 mmol/L (ref 98–111)
Creatinine, Ser: 1.67 mg/dL — ABNORMAL HIGH (ref 0.61–1.24)
GFR, Estimated: 44 mL/min — ABNORMAL LOW (ref >60)
Glucose, Bld: 112 mg/dL — ABNORMAL HIGH (ref 70–99)
Potassium: 4.4 mmol/L (ref 3.5–5.1)
Sodium: 138 mmol/L (ref 135–145)

## 2021-03-19 LAB — RESP PANEL BY RT-PCR (FLU A&B, COVID) ARPGX2
Influenza A by PCR: NEGATIVE
Influenza B by PCR: NEGATIVE
SARS Coronavirus 2 by RT PCR: NEGATIVE

## 2021-03-19 LAB — CBG MONITORING, ED
Glucose-Capillary: 161 mg/dL — ABNORMAL HIGH (ref 70–99)
Glucose-Capillary: 137 mg/dL — ABNORMAL HIGH (ref 70–99)

## 2021-03-19 LAB — HEMOGLOBIN A1C
Hgb A1c MFr Bld: 5.6 % (ref 4.8–5.6)
Mean Plasma Glucose: 114.02 mg/dL

## 2021-03-19 MED ORDER — MAGNESIUM SULFATE 2 GM/50ML IV SOLN
2.0000 g | Freq: Once | INTRAVENOUS | Status: AC
  Administered 2021-03-19: 2 g via INTRAVENOUS
  Filled 2021-03-19: qty 50

## 2021-03-19 MED ORDER — IPRATROPIUM-ALBUTEROL 0.5-2.5 (3) MG/3ML IN SOLN
3.0000 mL | Freq: Four times a day (QID) | RESPIRATORY_TRACT | Status: DC
  Administered 2021-03-19 – 2021-03-21 (×9): 3 mL via RESPIRATORY_TRACT
  Filled 2021-03-19 (×10): qty 3

## 2021-03-19 MED ORDER — SUMATRIPTAN SUCCINATE 25 MG PO TABS
25.0000 mg | ORAL_TABLET | ORAL | Status: DC | PRN
  Administered 2021-03-19: 25 mg via ORAL
  Filled 2021-03-19 (×2): qty 1

## 2021-03-19 MED ORDER — DIPHENHYDRAMINE HCL (SLEEP) 25 MG PO TABS: 25.0000 mg | ORAL_TABLET | Freq: Every day | ORAL | Status: DC | PRN

## 2021-03-19 MED ORDER — GABAPENTIN 300 MG PO CAPS
600.0000 mg | ORAL_CAPSULE | Freq: Every day | ORAL | Status: DC
  Administered 2021-03-19 – 2021-03-20 (×2): 600 mg via ORAL
  Filled 2021-03-19 (×2): qty 2

## 2021-03-19 MED ORDER — METHYLPREDNISOLONE SODIUM SUCC 40 MG IJ SOLR
40.0000 mg | Freq: Every day | INTRAMUSCULAR | Status: DC
  Administered 2021-03-20: 40 mg via INTRAVENOUS
  Filled 2021-03-19: qty 1

## 2021-03-19 MED ORDER — INSULIN ASPART 100 UNIT/ML IJ SOLN
0.0000 [IU] | Freq: Three times a day (TID) | INTRAMUSCULAR | Status: DC
  Administered 2021-03-19 (×2): 2 [IU] via SUBCUTANEOUS
  Administered 2021-03-19 – 2021-03-20 (×2): 3 [IU] via SUBCUTANEOUS
  Administered 2021-03-20 – 2021-03-21 (×3): 2 [IU] via SUBCUTANEOUS
  Filled 2021-03-19: qty 0.15

## 2021-03-19 MED ORDER — ALBUTEROL SULFATE (2.5 MG/3ML) 0.083% IN NEBU
2.5000 mg | INHALATION_SOLUTION | RESPIRATORY_TRACT | Status: DC | PRN
  Administered 2021-03-19: 2.5 mg via RESPIRATORY_TRACT
  Filled 2021-03-19: qty 3

## 2021-03-19 MED ORDER — ACETAMINOPHEN 650 MG RE SUPP: 650.0000 mg | Freq: Four times a day (QID) | RECTAL | Status: DC | PRN

## 2021-03-19 MED ORDER — FLUTICASONE PROPIONATE 50 MCG/ACT NA SUSP
2.0000 | Freq: Every day | NASAL | Status: DC
  Administered 2021-03-19 – 2021-03-21 (×3): 2 via NASAL
  Filled 2021-03-19: qty 16

## 2021-03-19 MED ORDER — LORATADINE 10 MG PO TABS
10.0000 mg | ORAL_TABLET | Freq: Every day | ORAL | Status: DC
  Administered 2021-03-19 – 2021-03-21 (×3): 10 mg via ORAL
  Filled 2021-03-19 (×3): qty 1

## 2021-03-19 MED ORDER — NICOTINE 14 MG/24HR TD PT24
14.0000 mg | MEDICATED_PATCH | Freq: Every day | TRANSDERMAL | Status: DC | PRN
  Administered 2021-03-19: 14 mg via TRANSDERMAL
  Filled 2021-03-19: qty 1

## 2021-03-19 MED ORDER — POLYVINYL ALCOHOL 1.4 % OP SOLN: 1.0000 [drp] | Freq: Every day | OPHTHALMIC | Status: DC | PRN

## 2021-03-19 MED ORDER — DEXTRAN 70-HYPROMELLOSE 0.1-0.3 % OP SOLN: 1.0000 [drp] | Freq: Every day | OPHTHALMIC | Status: DC | PRN

## 2021-03-19 MED ORDER — METHYLPREDNISOLONE SODIUM SUCC 125 MG IJ SOLR
80.0000 mg | INTRAMUSCULAR | Status: AC
  Administered 2021-03-19: 80 mg via INTRAVENOUS
  Filled 2021-03-19: qty 2

## 2021-03-19 MED ORDER — IPRATROPIUM-ALBUTEROL 0.5-2.5 (3) MG/3ML IN SOLN
3.0000 mL | RESPIRATORY_TRACT | Status: DC | PRN
  Administered 2021-03-20: 3 mL via RESPIRATORY_TRACT
  Filled 2021-03-19: qty 3

## 2021-03-19 MED ORDER — GABAPENTIN 300 MG PO CAPS
300.0000 mg | ORAL_CAPSULE | Freq: Every day | ORAL | Status: DC
  Administered 2021-03-19 – 2021-03-21 (×3): 300 mg via ORAL
  Filled 2021-03-19 (×3): qty 1

## 2021-03-19 MED ORDER — ALBUTEROL SULFATE HFA 108 (90 BASE) MCG/ACT IN AERS: 2.0000 | INHALATION_SPRAY | RESPIRATORY_TRACT | Status: DC | PRN

## 2021-03-19 MED ORDER — MONTELUKAST SODIUM 10 MG PO TABS
10.0000 mg | ORAL_TABLET | Freq: Every day | ORAL | Status: DC
  Administered 2021-03-19 – 2021-03-20 (×2): 10 mg via ORAL
  Filled 2021-03-19 (×3): qty 1

## 2021-03-19 MED ORDER — ALBUTEROL SULFATE (2.5 MG/3ML) 0.083% IN NEBU: 2.5000 mg | INHALATION_SOLUTION | Freq: Four times a day (QID) | RESPIRATORY_TRACT | Status: DC

## 2021-03-19 MED ORDER — DIPHENHYDRAMINE HCL 25 MG PO CAPS: 25.0000 mg | ORAL_CAPSULE | Freq: Every evening | ORAL | Status: DC | PRN

## 2021-03-19 MED ORDER — ATORVASTATIN CALCIUM 40 MG PO TABS
40.0000 mg | ORAL_TABLET | Freq: Every day | ORAL | Status: DC
  Administered 2021-03-19 – 2021-03-21 (×3): 40 mg via ORAL
  Filled 2021-03-19 (×3): qty 1

## 2021-03-19 MED ORDER — DOCUSATE SODIUM 100 MG PO CAPS: 100.0000 mg | ORAL_CAPSULE | Freq: Every day | ORAL | Status: DC | PRN

## 2021-03-19 MED ORDER — ONDANSETRON HCL 4 MG PO TABS: 4.0000 mg | ORAL_TABLET | Freq: Four times a day (QID) | ORAL | Status: DC | PRN

## 2021-03-19 MED ORDER — LEVOCETIRIZINE DIHYDROCHLORIDE 5 MG PO TABS: 5.0000 mg | ORAL_TABLET | Freq: Every day | ORAL | Status: DC

## 2021-03-19 MED ORDER — TAMSULOSIN HCL 0.4 MG PO CAPS
0.4000 mg | ORAL_CAPSULE | Freq: Every day | ORAL | Status: DC
  Administered 2021-03-19 – 2021-03-21 (×3): 0.4 mg via ORAL
  Filled 2021-03-19 (×3): qty 1

## 2021-03-19 MED ORDER — ACETAMINOPHEN 325 MG PO TABS
650.0000 mg | ORAL_TABLET | Freq: Four times a day (QID) | ORAL | Status: DC | PRN
  Administered 2021-03-19: 650 mg via ORAL
  Filled 2021-03-19: qty 2

## 2021-03-19 MED ORDER — IPRATROPIUM BROMIDE 0.02 % IN SOLN: 0.5000 mg | Freq: Four times a day (QID) | RESPIRATORY_TRACT | Status: DC

## 2021-03-19 MED ORDER — GABAPENTIN 300 MG PO CAPS
300.0000 mg | ORAL_CAPSULE | ORAL | Status: DC
Start: 2021-03-19 — End: 2021-03-19

## 2021-03-19 MED ORDER — PREDNISONE 20 MG PO TABS: 40.0000 mg | ORAL_TABLET | Freq: Every day | ORAL | Status: DC

## 2021-03-19 MED ORDER — AMLODIPINE BESYLATE 5 MG PO TABS
2.5000 mg | ORAL_TABLET | Freq: Every day | ORAL | Status: DC
  Administered 2021-03-19 – 2021-03-21 (×3): 2.5 mg via ORAL
  Filled 2021-03-19 (×3): qty 1

## 2021-03-19 MED ORDER — LIDOCAINE 5 % EX PTCH
1.0000 | MEDICATED_PATCH | Freq: Two times a day (BID) | CUTANEOUS | Status: DC | PRN
  Filled 2021-03-19: qty 1

## 2021-03-19 MED ORDER — DICLOFENAC SODIUM 1 % EX GEL: 2.0000 g | Freq: Two times a day (BID) | CUTANEOUS | Status: DC | PRN

## 2021-03-19 MED ORDER — ONDANSETRON HCL 4 MG/2ML IJ SOLN: 4.0000 mg | Freq: Four times a day (QID) | INTRAMUSCULAR | Status: DC | PRN

## 2021-03-19 NOTE — Progress Notes (Addendum)
PROGRESS NOTE    Rai Baynes  FXT:[id number] DOB: [birth year] DOA: 03/18/2021 PCP: Leighton Ruff, MD    Brief Narrative:  Mr. Sar was admitted to the hospital with working diagnosis of COPD exacerbation, complicated with acute on chronic hypoxemic respiratory failure.  70 year old male past medical history for DVT, pulmonary embolism, chronic iron deficiency anemia, aortic atherosclerosis, osteoarthritis, coronary artery disease, chronic kidney disease stage IIIa, hypertension, GERD, gout, nephrolithiasis, type 2 diabetes mellitus who presented with dyspnea.  Patient continued to smoke cigarettes at home.  Patient developed sudden onset of dyspnea, that progressed throughout the day to the point where he became severe and he called EMS for further evaluation.  His temperature was 97.7, heart rate 82, respiratory rate 21-23, blood pressure 147/85, oxygen saturation 96%, his lungs had decreased air movement, bilateral rhonchi and wheezing, heart S1-S2, present, rhythmic, abdomen soft nontender, no lower extremity edema.  Sodium 138, potassium 4.4, chloride 108, bicarb 22, glucose 112, BUN 13, creatinine 1.67, white count 6.4, hemoglobin 13.3, hematocrit 42.2, platelets 204. SARS COVID-19 negative.  Chest radiograph with hyperinflation, no infiltrates.  Increased lung markings in the apical lobes bilaterally.  EKG 82 bpm, left axis deviation, normal intervals, sinus rhythm, no significant ST segment or T wave changes.  Assessment & Plan:   Principal Problem:   COPD with acute exacerbation ([place]) Active Problems:   Essential hypertension   Coronary artery calcification   Chronic anticoagulation   Elevated prostate specific antigen (PSA)   Type 2 diabetes mellitus (HCC)   Obstructive sleep apnea   Hyperlipidemia   Allergic rhinitis   CKD stage 3a   Grade I diastolic dysfunction  Acute COPD exacerbation with acute on chronic hypoxemic respiratory failure.  Patient at home uses  intermittent oxygen, today at rest at the time of my examination on room air is down to 90%, he continue to have diffuse wheezing and rhonchi, positive dyspnea at rest, worse than his baseline.   Plan to continue with aggressive bronchodilator therapy, continue with systemic corticosteroids Inhaled corticosteroids and airway clearing techniques with flutter valve and incentive spirometer.   2. HTN/ diastolic heart failure/ CAD. Continue blood pressure control with amlodipine.  No clinical signs of heart failure exacerbation  3. T2DM and dyslipidemia. Continue glucose control with insulin sliding scale. Patient is tolerating po well.  Continue with statin therapy.   4. CKD stage 3a. Renal function stable, continue close follow up on electrolytes.   5. Pulmonary embolism and DVT in the past. Anticoagulation has been on hold for prostate biopsy.    Patient continue to be at high risk for worsening respiratory failure   Status is: Observation  The patient will require care spanning > 2 midnights and should be moved to inpatient because: IV treatments appropriate due to intensity of illness or inability to take PO  Dispo: The patient is from: Home              Anticipated d/c is to: Home              Patient currently is not medically stable to d/c.   Difficult to place patient No  DVT prophylaxis: Enoxaparin   Code Status:    full  Family Communication:  No family at the bedside     Subjective: Patient continue to have dyspnea at rest not at his baseline, continue to have cough and congestion, no nausea or vomiting, no chest pain .  Objective: Vitals:   03/19/21 1300 03/19/21 1400 03/19/21 1500 03/19/21  1526  BP: 133/80 130/90 135/84   Pulse: 95 92 88   Resp: 18 (!) 22 (!) 22   Temp:   97.8 F (36.6 C)   TempSrc:   Oral   SpO2: 96% 94% 97% 96%    Intake/Output Summary (Last 24 hours) at 03/19/2021 1600 Last data filed at 03/19/2021 0703 Gross per 24 hour  Intake 50.19 ml   Output --  Net 50.19 ml   There were no vitals filed for this visit.  Examination:   General: Not in pain, positive dyspnea at rest.  Neurology: Awake and alert, non focal  E ENT: mild pallor, no icterus, oral mucosa moist Cardiovascular: No JVD. S1-S2 present, rhythmic, no gallops, rubs, or murmurs. No lower extremity edema. Pulmonary: positive breath sounds bilaterally, with diffuse bilateral wheezing with rhonchi and rales. Gastrointestinal. Abdomen soft and non tender Skin. No rashes Musculoskeletal: no joint deformities     Data Reviewed: I have personally reviewed following labs and imaging studies  CBC: Recent Labs  Lab 03/19/21 0006  WBC 6.4  NEUTROABS 5.3  HGB 13.3  HCT 42.2  MCV 89.4  PLT 240   Basic Metabolic Panel: Recent Labs  Lab 03/19/21 0006  NA 138  K 4.4  CL 108  CO2 22  GLUCOSE 112*  BUN 13  CREATININE 1.67*  CALCIUM 9.2   GFR: CrCl cannot be calculated (Unknown ideal weight.). Liver Function Tests: No results for input(s): AST, ALT, ALKPHOS, BILITOT, PROT, ALBUMIN in the last 168 hours. No results for input(s): LIPASE, AMYLASE in the last 168 hours. No results for input(s): AMMONIA in the last 168 hours. Coagulation Profile: No results for input(s): INR, PROTIME in the last 168 hours. Cardiac Enzymes: No results for input(s): CKTOTAL, CKMB, CKMBINDEX, TROPONINI in the last 168 hours. BNP (last 3 results) No results for input(s): PROBNP in the last 8760 hours. HbA1C: Recent Labs    03/19/21 0006  HGBA1C 5.6   CBG: Recent Labs  Lab 03/19/21 1000 03/19/21 1231  GLUCAP 161* 137*   Lipid Profile: No results for input(s): CHOL, HDL, LDLCALC, TRIG, CHOLHDL, LDLDIRECT in the last 72 hours. Thyroid Function Tests: No results for input(s): TSH, T4TOTAL, FREET4, T3FREE, THYROIDAB in the last 72 hours. Anemia Panel: No results for input(s): VITAMINB12, FOLATE, FERRITIN, TIBC, IRON, RETICCTPCT in the last 72 hours.    Radiology  Studies: I have reviewed all of the imaging during this hospital visit personally     Scheduled Meds:  amLODipine  2.5 mg Oral Daily   atorvastatin  40 mg Oral Daily   gabapentin  300 mg Oral Daily   And   gabapentin  600 mg Oral QHS   insulin aspart  0-15 Units Subcutaneous TID WC   ipratropium-albuterol  3 mL Nebulization QID   loratadine  10 mg Oral Daily   montelukast  10 mg Oral QHS   [START ON 03/20/2021] predniSONE  40 mg Oral Q breakfast   tamsulosin  0.4 mg Oral Daily   Continuous Infusions:   LOS: 0 days        Jennifr Gaeta Gerome Apley, MD

## 2021-03-19 NOTE — H&P (Signed)
History and Physical    Gregory Christensen RWE:[id number] DOB: [birth year] DOA: 03/18/2021  PCP: Leighton Ruff, MD  Patient coming from: Home.  I have personally briefly reviewed patient's old medical records in [place]  Chief Complaint: Shortness of breath.  HPI: Gregory Christensen is a 70 y.o. male with medical history significant of DVT, pulmonary embolism, previous iron deficiency anemia, aortic atherosclerosis, osteoarthritis, BPH, CAD, chronic back and hip pain, stage 3a CKD, hypertension, GERD, gout, history of gross hematuria, nephrolithiasis, history of a head injury with LOC type II DM, history of renal mass, seasonal allergies, sleep apnea who is coming to the emergency department via EMS due to dyspnea associated with wheezing and chest tightness since about 1730 that did not improve with his home bronchodilators.  He has not had a fever, chills, rhinorrhea, sneezing, sore throat, increased sputum production, exposure to allergens or fumes.  However, the patient stated that he has recently smoked a few cigarettes a day.  He subsequently called EMS later in the evening since he was not improving.  He was given 125 mg of methylprednisolone, albuterol 10+ ipratropium 1 mg continuous neb in route to the emergency department.  No palpitations, diaphoresis, PND, orthopnea or recent pitting edema of the lower extremities.  Denied abdominal pain, nausea, emesis, diarrhea, constipation, melena or hematochezia.  No dysuria, flank pain, frequency or hematuria.  No polyuria, polydipsia, polyphagia or blurred vision.  ED Course: Initial vital signs were temperature 97.7 F, pulse 82, respiration 19, BP 147/85 mmHg and O2 sat was 100% on unspecified supplemental oxygen.  The patient received another 10 mg of albuterol via neb.  Lab work: His CBC was normal.  BMP showed normal electrolytes.  Glucose was 112 and creatinine 1.67 mg/dL.  Troponin was 4 ng/L.  EKG was sinus rhythm with borderline T wave  abnormalities unchanged from previous tracing.  Imaging: 2 view chest radiograph showed no active cardiopulmonary disease.  There was aortic atherosclerosis and emphysema.  Please see images and full radiology report for further details.  Review of Systems: As per HPI otherwise all other systems reviewed and are negative.  Past Medical History:  Diagnosis Date   Acute deep vein thrombosis (DVT) of left lower extremity (HCC)    Acute hypoxemic respiratory failure (HCC)    Anemia    Iron deficiency   Anxiety    Aortic atherosclerosis (HCC)    Arthritis    Benign prostatic hyperplasia 08/28/2018   UNSPECIFIED WHETHER LOWER URINARY TRACT SYMPTOMS PRESENT   CAD (coronary artery disease)    Cancer (HCC)    Chronic back pain    Chronic hip pain    CKD stage 3a 03/19/2021   Constipation    due to Iron M-W-F   COPD exacerbation ([place]) 10/18/2017   Dyspnea    Emphysema lung ([place])    Essential hypertension    GERD (gastroesophageal reflux disease)    Gout    Gross hematuria 09/08/2018   Head injury, acute, with loss of consciousness ([place])    unsure how long he was unresponsive   History of blood transfusion 05/2019   History of kidney stones    History of tobacco abuse    Hypoxia    Insomnia    Oxygen dependent    2l- 24/7   Pneumonia    in 1980's   Pre-diabetes    Pulmonary embolism ([place]) 05/31/2018   Renal mass    CONCERNING FOR RENAL CELL CARCINOMA DR. Lovena Neighbours   Respiratory distress  Respiratory failure, acute-on-chronic ([place]) 08/03/2018   Seasonal allergies    Sleep apnea    Past Surgical History:  Procedure Laterality Date   COLONOSCOPY WITH PROPOFOL N/A 09/27/2019   Procedure: COLONOSCOPY WITH PROPOFOL;  Surgeon: Irene Shipper, MD;  Location: [facility];  Service: Endoscopy;  Laterality: N/A;   CYSTOSCOPY/URETEROSCOPY/HOLMIUM LASER/STENT PLACEMENT Left 10/31/2018   Procedure: CYSTOSCOPY/URETEROSCOPY;  Surgeon: Ceasar Mons, MD;  Location: [facility];  Service:  Urology;  Laterality: Left;   ESOPHAGOGASTRODUODENOSCOPY (EGD) WITH PROPOFOL N/A 09/27/2019   Procedure: ESOPHAGOGASTRODUODENOSCOPY (EGD) WITH PROPOFOL;  Surgeon: Irene Shipper, MD;  Location: [facility];  Service: Endoscopy;  Laterality: N/A;   HERNIA REPAIR Left 1977   inguinal   HYDROCELE EXCISION Left 05/13/2019   Procedure: HYDROCELECTOMY ADULT;  Surgeon: Ceasar Mons, MD;  Location: [facility];  Service: Urology;  Laterality: Left;   [place]   EXTENSIVE   POLYPECTOMY  09/27/2019   Procedure: POLYPECTOMY;  Surgeon: Irene Shipper, MD;  Location: [facility];  Service: Endoscopy;;   ROBOT ASSITED LAPAROSCOPIC NEPHROURETERECTOMY Left 10/31/2018   Procedure: XI ROBOT ASSITED LAPAROSCOPIC NEPHROURETERECTOMY;  Surgeon: Ceasar Mons, MD;  Location: [facility];  Service: Urology;  Laterality: Left;  ONLY NEEDS 240 MIN FOR ALL PROCEDURES   SHOULDER ARTHROSCOPY WITH ROTATOR CUFF REPAIR Left 04/12/2020   Procedure: LEFT SHOULDER ARTHROSCOPY, DEBRIDEMENT, BICEPS TENODESIS, MINI OPEN ROTATOR CUFF TEAR REPAIR;  Surgeon: Meredith Pel, MD;  Location: [facility];  Service: Orthopedics;  Laterality: Left;   Social History  reports that he has quit smoking. His smoking use included cigarettes. He has a 28.50 pack-year smoking history. He has never used smokeless tobacco. He reports that he does not currently use alcohol after a past usage of about 10.0 standard drinks per week. He reports that he does not currently use drugs after having used the following drugs: Flunitrazepam.  No Known Allergies  Family History  Problem Relation Age of Onset   Diabetes Mother    Hypertension Sister    Hypertension Brother    Prior to Admission medications   Medication Sig Start Date End Date Taking? Authorizing Provider  albuterol (PROVENTIL HFA;VENTOLIN HFA) 108 (90 Base) MCG/ACT inhaler Inhale 2 puffs into the lungs every 2 (two) hours as needed for wheezing or shortness of  breath. 08/04/18  Yes Reyne Dumas, MD  albuterol (PROVENTIL) (2.5 MG/3ML) 0.083% nebulizer solution Take 3 mLs (2.5 mg total) by nebulization every 6 (six) hours as needed for wheezing or shortness of breath. 08/04/18  Yes Reyne Dumas, MD  amLODipine (NORVASC) 2.5 MG tablet Take 2.5 mg by mouth daily.   Yes [provider]  apixaban (ELIQUIS) 5 MG TABS tablet Take 1 tablet (5 mg total) by mouth 2 (two) times daily. 06/10/18  Yes Regalado, Belkys A, MD  atorvastatin (LIPITOR) 40 MG tablet Take 40 mg by mouth daily. 02/24/21  Yes [provider]  ciprofloxacin (CIPRO) 500 MG tablet Take 500 mg by mouth as directed. TAKE ONE TABLET BY MOUTH TWICE A DAY (MONITOR and REPORT ANY SUDDEN ONSET TENDON PAIN) TAKE THE DAY BEFORE, THE DAY OF, AND THE DAY AFTER PROSTATE BIOPSY   Yes [provider]  Dextran 70-Hypromellose (ARTIFICIAL TEARS) 0.1-0.3 % SOLN Place 1 drop into both eyes daily as needed (dry eyes).   Yes [provider]  diclofenac Sodium (VOLTAREN) 1 % GEL Apply 2 g topically 2 (two) times daily as needed (Pain).   Yes [provider]  diphenhydrAMINE (  SOMINEX) 25 MG tablet Take 25 mg by mouth daily as needed for itching. 02/15/16  Yes [provider]  docusate sodium (COLACE) 100 MG capsule Take 100 mg by mouth daily as needed for mild constipation.   Yes [provider]  fluticasone (FLONASE) 50 MCG/ACT nasal spray Place 1 spray into both nostrils 2 (two) times daily. Patient taking differently: Place 1 spray into both nostrils daily as needed for allergies. 08/04/18  Yes Reyne Dumas, MD  fluticasone-salmeterol (WIXELA INHUB) 250-50 MCG/ACT AEPB Inhale 1 puff into the lungs in the morning and at bedtime.   Yes [provider]  gabapentin (NEURONTIN) 300 MG capsule Take 300-600 mg by mouth See admin instructions. Takes 1 capsule in the morning and 2 capsule at night   Yes [provider]  levocetirizine (XYZAL) 5 MG tablet  Take 5 mg by mouth daily.   Yes [provider]  lidocaine (LIDODERM) 5 % Place 1 patch onto the skin every 12 (twelve) hours as needed (pain).   Yes [provider]  montelukast (SINGULAIR) 10 MG tablet Take 10 mg by mouth at bedtime.   Yes [provider]  pravastatin (PRAVACHOL) 20 MG tablet Take 20 mg by mouth daily.   Yes [provider]  sildenafil (VIAGRA) 100 MG tablet Take 100 mg by mouth daily as needed for erectile dysfunction. 01/25/20  Yes [provider]  tamsulosin (FLOMAX) 0.4 MG CAPS capsule Take 0.4 mg by mouth daily.   Yes [provider]  Tiotropium Bromide Monohydrate (SPIRIVA RESPIMAT) 2.5 MCG/ACT AERS Inhale 2 puffs into the lungs daily.   Yes [provider]  amoxicillin-clavulanate (AUGMENTIN) 875-125 MG tablet Take 1 tablet by mouth 2 (two) times daily. Start date : 12/12/20 Patient not taking: Reported on 03/19/2021 12/12/20   [provider]  budesonide-formoterol (SYMBICORT) 160-4.5 MCG/ACT inhaler Inhale 1 puff into the lungs 2 (two) times daily. Patient not taking: No sig reported 08/04/18   Reyne Dumas, MD  cephALEXin (KEFLEX) 500 MG capsule Take 1 capsule (500 mg total) by mouth 2 (two) times daily. Patient not taking: No sig reported 04/13/20   Orpah Greek, MD  HYDROcodone-acetaminophen (NORCO/VICODIN) 5-325 MG tablet Take 1 tablet by mouth daily as needed for moderate pain. Patient not taking: No sig reported 06/07/20 06/07/21  Magnant, Gerrianne Scale, PA-C  methocarbamol (ROBAXIN) 500 MG tablet Take 1 tablet (500 mg total) by mouth every 8 (eight) hours as needed. Patient not taking: No sig reported 06/07/20   Magnant, Charles L, PA-C  predniSONE (DELTASONE) 20 MG tablet Take 2 tablets (40 mg total) by mouth daily with breakfast. Patient not taking: No sig reported 06/20/20   Truddie Hidden, MD   Physical Exam: Vitals:   03/18/21 2230 03/18/21 2300 03/18/21 2343 03/19/21 0000  BP:  (!) 147/85 102/82  133/90  Pulse: 82 89  83  Resp: 19 (!) 23  (!) 21  Temp:    97.7 F (36.5 C)  TempSrc:    Oral  SpO2: 100% 98% 96% 99%   Constitutional: NAD, calm, comfortable Eyes: PERRL, lids and conjunctivae normal.  Mildly injected sclera. ENMT: Nasal cannula in place.  Mucous membranes are moist. Posterior pharynx clear of any exudate or lesions. Neck: normal, supple, no masses, no thyromegaly Respiratory: Mild decrease in air movement with bilateral rhonchi and wheezing, no crackles. Normal respiratory effort. No accessory muscle use.  Cardiovascular: Regular rate and rhythm, no murmurs / rubs / gallops. No extremity edema. 2+ pedal  pulses. No carotid bruits.  Abdomen: No distention.  Bowel sounds positive.  Soft, no tenderness, no masses palpated. No hepatosplenomegaly. Musculoskeletal: no clubbing / cyanosis.  Good ROM, no contractures. Normal muscle tone.  Skin: no acute rashes, lesions, ulcers on very limited dermatological examination. Neurologic: CN 2-12 grossly intact. Sensation intact, DTR normal. Strength 5/5 in all 4.  Psychiatric: Normal judgment and insight. Alert and oriented x 3. Normal mood.   Labs on Admission: I have personally reviewed following labs and imaging studies  CBC: Recent Labs  Lab 03/19/21 0006  WBC 6.4  NEUTROABS 5.3  HGB 13.3  HCT 42.2  MCV 89.4  PLT 161    Basic Metabolic Panel: Recent Labs  Lab 03/19/21 0006  NA 138  K 4.4  CL 108  CO2 22  GLUCOSE 112*  BUN 13  CREATININE 1.67*  CALCIUM 9.2    GFR: CrCl cannot be calculated (Unknown ideal weight.).  Liver Function Tests: No results for input(s): AST, ALT, ALKPHOS, BILITOT, PROT, ALBUMIN in the last 168 hours.  Radiological Exams on Admission: DG Chest 2 View  Result Date: 03/18/2021 CLINICAL DATA:  Hx of CAD, COPD, emphysema, pna, PE, ex smoker. EXAM: CHEST - 2 VIEW COMPARISON:  Cxr 06/20/20 FINDINGS: The heart and mediastinal contours are within normal limits.  Atherosclerotic plaque. Slightly more lucent right mid lung zone likely due to patient positioning. No focal consolidation. No pulmonary edema. No pleural effusion. No pneumothorax. No acute osseous abnormality. IMPRESSION: 1. No active cardiopulmonary disease. 2. Aortic Atherosclerosis (ICD10-I70.0) and Emphysema (ICD10-J43.9). Electronically Signed   By: Iven Finn M.D.   On: 03/18/2021 23:57    EKG: Independently reviewed.  Vent. rate 82 BPM PR interval 154 ms QRS duration 84 ms QT/QTcB 396/463 ms P-R-T axes 78 53 83 Sinus rhythm Borderline T abnormalities, anterior leads  Assessment/Plan Principal Problem:   COPD with acute exacerbation (HCC) Observation/telemetry. Continue supplemental oxygen. Continue scheduled and PRN SABA.Marland Kitchen Continue Solu-Medrol 40 mg every 12 hours. Begin prednisone taper afterwards. Smoking cessation advised. Nicotine replacement therapy as needed.  Active Problems:   Allergic rhinitis Continue levocetirizine and montelukast. Hold fluticasone spray while on systemic glucocorticoid.    Elevated prostate specific antigen (PSA) Scheduled for biopsy Monday morning. Told him he may need to reschedule. Will continue to hold apixaban. SCDs for DVT prophylaxis.    Chronic anticoagulation History of DVT and PE. Scheduled for biopsy Monday morning.    Grade I diastolic dysfunction No signs of decompensation. May benefit from ACE inhibitor.    Essential hypertension Continue amlodipine 2.5 mg p.o. daily. Monitor blood pressure.    Coronary artery calcification On apixaban and statin.    Type 2 diabetes mellitus (HCC) Carbohydrate modified diet. CBG monitoring with RI SS while on glucocorticoids.    Obstructive sleep apnea Not on CPAP.    Hyperlipidemia Continue atorvastatin.    CKD stage 3a Around baseline. Monitor GFR and electrolytes.    DVT prophylaxis: On apixaban (held due to prostate biopsy on schedule).  Begin SCDs Code  Status:   Full code. Family Communication:   Disposition Plan:   Patient is from:  Home.  Anticipated DC to:  Home.  Anticipated DC date:  03/19/2021 or 03/20/2021.  Anticipated DC barriers: Clinical status.  Consults called:   Admission status:  Observation/telemetry.  Severity of Illness:  High severity after presenting with acute dyspnea, fatigue and wheezing in the setting of COPD exacerbation with partial response to bronchodilators.  Reubin Milan MD [facility]  How to contact the [facility] Attending or Consulting provider [place] or covering provider during after hours [place], for this patient?   Check the care team in [facility] and look for a) attending/consulting [facility] provider listed and b) the [facility] team listed Log into [email or web address] and use [facility]'s universal password to access. If you do not have the password, please contact the hospital operator. Locate the [facility] provider you are looking for under [facility] and page to a number that you can be directly reached. If you still have difficulty reaching the provider, please page the [facility] (Director on Call) for the Hospitalists listed on amion for assistance.  03/19/2021, 3:41 AM   This document was prepared using Dragon voice recognition software and may contain some unintended transcription errors.

## 2021-03-19 NOTE — ED Notes (Signed)
Call received from pt daughter Skip Litke [phone] requesting rtn call for pt status/updates. [employer]

## 2021-03-19 NOTE — ED Notes (Signed)
Pt called out and asked for an emergency inhaler. Provided albuterol nebulizer.

## 2021-03-19 NOTE — Plan of Care (Signed)

## 2021-03-19 NOTE — ED Notes (Signed)
Breakfast tray given. °

## 2021-03-19 NOTE — ED Notes (Signed)
Lunch tray given. 

## 2021-03-20 ENCOUNTER — Inpatient Hospital Stay (HOSPITAL_COMMUNITY): Payer: No Typology Code available for payment source

## 2021-03-20 DIAGNOSIS — R0609 Other forms of dyspnea: Secondary | ICD-10-CM

## 2021-03-20 DIAGNOSIS — J441 Chronic obstructive pulmonary disease with (acute) exacerbation: Secondary | ICD-10-CM | POA: Diagnosis not present

## 2021-03-20 LAB — CBC WITH DIFFERENTIAL/PLATELET
Abs Immature Granulocytes: 0.07 10*3/uL (ref 0.00–0.07)
Basophils Absolute: 0 10*3/uL (ref 0.0–0.1)
Basophils Relative: 0 %
Eosinophils Absolute: 0 10*3/uL (ref 0.0–0.5)
Eosinophils Relative: 0 %
HCT: 41 % (ref 39.0–52.0)
Hemoglobin: 13.3 g/dL (ref 13.0–17.0)
Immature Granulocytes: 1 %
Lymphocytes Relative: 9 %
Lymphs Abs: 0.9 10*3/uL (ref 0.7–4.0)
MCH: 28.1 pg (ref 26.0–34.0)
MCHC: 32.4 g/dL (ref 30.0–36.0)
MCV: 86.7 fL (ref 80.0–100.0)
Monocytes Absolute: 0.8 10*3/uL (ref 0.1–1.0)
Monocytes Relative: 8 %
Neutro Abs: 8.3 10*3/uL — ABNORMAL HIGH (ref 1.7–7.7)
Neutrophils Relative %: 82 %
Platelets: 202 10*3/uL (ref 150–400)
RBC: 4.73 MIL/uL (ref 4.22–5.81)
RDW: 14.1 % (ref 11.5–15.5)
WBC: 10.1 10*3/uL (ref 4.0–10.5)
nRBC: 0 % (ref 0.0–0.2)

## 2021-03-20 LAB — GLUCOSE, CAPILLARY
Glucose-Capillary: 129 mg/dL — ABNORMAL HIGH (ref 70–99)
Glucose-Capillary: 132 mg/dL — ABNORMAL HIGH (ref 70–99)
Glucose-Capillary: 141 mg/dL — ABNORMAL HIGH (ref 70–99)
Glucose-Capillary: 166 mg/dL — ABNORMAL HIGH (ref 70–99)
Glucose-Capillary: 148 mg/dL — ABNORMAL HIGH (ref 70–99)

## 2021-03-20 LAB — ECHOCARDIOGRAM COMPLETE
Area-P 1/2: 2.39 cm2
Height: 73 in
S' Lateral: 2.1 cm
Weight: 2640 oz

## 2021-03-20 LAB — HIV ANTIBODY (ROUTINE TESTING W REFLEX): HIV Screen 4th Generation wRfx: NONREACTIVE

## 2021-03-20 LAB — BASIC METABOLIC PANEL
Anion gap: 9 (ref 5–15)
BUN: 27 mg/dL — ABNORMAL HIGH (ref 8–23)
CO2: 24 mmol/L (ref 22–32)
Calcium: 9.5 mg/dL (ref 8.9–10.3)
Chloride: 106 mmol/L (ref 98–111)
Creatinine, Ser: 2.11 mg/dL — ABNORMAL HIGH (ref 0.61–1.24)
GFR, Estimated: 33 mL/min — ABNORMAL LOW (ref >60)
Glucose, Bld: 128 mg/dL — ABNORMAL HIGH (ref 70–99)
Potassium: 5.2 mmol/L — ABNORMAL HIGH (ref 3.5–5.1)
Sodium: 139 mmol/L (ref 135–145)

## 2021-03-20 MED ORDER — SODIUM CHLORIDE 0.9 % IV SOLN: INTRAVENOUS | Status: DC

## 2021-03-20 MED ORDER — LIP MEDEX EX OINT
TOPICAL_OINTMENT | CUTANEOUS | Status: DC | PRN
  Filled 2021-03-20: qty 7

## 2021-03-20 MED ORDER — METHYLPREDNISOLONE SODIUM SUCC 40 MG IJ SOLR
40.0000 mg | Freq: Two times a day (BID) | INTRAMUSCULAR | Status: DC
  Administered 2021-03-20 – 2021-03-21 (×2): 40 mg via INTRAVENOUS
  Filled 2021-03-20 (×2): qty 1

## 2021-03-20 NOTE — Progress Notes (Signed)
PROGRESS NOTE  Gregory Christensen  DOB: 1950-10-25  PCP: Leighton Ruff, MD WRU:[id number]  DOA: 03/18/2021  LOS: 1 day  Hospital Day: 3   Chief Complaint  Patient presents with   Shortness of Breath    Brief narrative: Gregory Christensen is a 70 y.o. male with PMH significant for DM2, HTN, COPD on home oxygen, continues to smoke 1 pack/day, DVT/PE, CAD, CKD 3a, chronic iron deficiency anemia, osteoarthritis, GERD, nephrolithiasis, gout who lives at home with his mom and is ambulatory at baseline. Patient presented to the ED on 03/18/2021 with complaint of dyspnea progressively worsening for 1 to 2 days.  In the ED, patient had decreased air movement bilaterally with rhonchi and wheezing. Creatinine was elevated to 1.67 COVID PCR negative. Chest x-ray with hyperinflation, no infiltrates. He was admitted for COPD exacerbation. See below for details  Subjective: Patient was seen and examined this morning.  Pleasant elderly African-American male.  Lying on bed.  Not in distress.  Not on supplemental oxygen.  However has diffuse bilateral wheezing on auscultation. Also, blood work this morning showed elevated creatinine to 2.11 and elevated potassium level to 5.2.  Assessment/Plan: Acute COPD exacerbation Acute on chronic hypoxemic respiratory failure.  -Presented with worsening dyspnea.  On intermittent use of 2 L oxygen at home.  Continues to smoke.   -Diffuse wheezing on auscultation.  Currently on IV Solu-Medrol at 40 mg IV daily but continues to have wheezing.  Although not on supplemental oxygen at rest, gets easily dyspneic on ambulation. I will increase the frequency of Solu-Medrol to 40 mg IV twice daily.  -Not on antibiotics at this time.  -Continue bronchodilators, incentive spirometry.  Encourage ambulation.   AKI on CKD stage IIIa History of left renal cell cancer status post nephrectomy -Baseline creatinine 1.8 from June 2022. -In last 24 hours, creatinine has worsened to  2.11.  Unclear reason.  Not on diuretics. -Continue to monitor creatinine. 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  Currently on hold for prostatic biopsy.  Chronic diastolic CHF HTN -On amlodipine.  Not on diuretics or beta-blocker. -Last echo from 2019 with EF 55 to 60% and grade 1 diastolic dysfunction.  I will obtain a repeat echocardiogram.  CAD/ HLD -Continue statin.  ?Type 2 diabetes mellitus -A1c 5.6 on 03/19/2021 -Not on any antidiabetic regimen at home. -Blood sugar level remains controlled under 150 consistently. Recent Labs  Lab 03/19/21 1231 03/19/21 1701 03/19/21 2149 03/20/21 0809 03/20/21 1238  GLUCAP 137* 129* 159* 132* 141*   Chronic daily smoker -Smokes 1 pack/day.  Counseled to quit.  Nicotine patch offered.   BPH -Flomax.   Mobility: Encourage ambulation Code Status:   Code Status: Full Code  Nutritional status: Body mass index is 21.77 kg/m.     Diet:  Diet Order             Diet heart healthy/carb modified Room service appropriate? Yes; Fluid consistency: Thin  Diet effective now                  DVT prophylaxis:  Place and maintain sequential compression device Start: 03/20/21 0755   Antimicrobials: None Fluid: None Consultants: None Family Communication: None at bedside  Status is: Inpatient  Remains inpatient appropriate  because: Continues to wheeze  Dispo: The patient is from: Home              Anticipated d/c is to: Home in 1 to 2 days              Patient currently is not medically stable to d/c.   Difficult to place patient No     Infusions:   sodium chloride 75 mL/hr at 03/20/21 1215    Scheduled Meds:  amLODipine  2.5 mg Oral Daily   atorvastatin  40 mg Oral Daily   fluticasone   2 spray Each Nare Daily   gabapentin  300 mg Oral Daily   And   gabapentin  600 mg Oral QHS   insulin aspart  0-15 Units Subcutaneous TID WC   ipratropium-albuterol  3 mL Nebulization QID   loratadine  10 mg Oral Daily   methylPREDNISolone (SOLU-MEDROL) injection  40 mg Intravenous Q12H   montelukast  10 mg Oral QHS   tamsulosin  0.4 mg Oral Daily    Antimicrobials: Anti-infectives (From admission, onward)    None       PRN meds: acetaminophen **OR** acetaminophen, albuterol, diclofenac Sodium, diphenhydrAMINE, docusate sodium, ipratropium-albuterol, lidocaine, lip balm, nicotine, ondansetron **OR** ondansetron (ZOFRAN) IV, polyvinyl alcohol, SUMAtriptan   Objective: Vitals:   03/20/21 0930 03/20/21 1138  BP:    Pulse:    Resp:    Temp:    SpO2: 96% 92%    Intake/Output Summary (Last 24 hours) at 03/20/2021 1349 Last data filed at 03/20/2021 0500 Gross per 24 hour  Intake 480 ml  Output 900 ml  Net -420 ml   Filed Weights   03/19/21 1934  Weight: 74.8 kg   Weight change:  Body mass index is 21.77 kg/m.   Physical Exam: General exam: Pleasant, elderly African-American male.  Lying down in bed.  Not in distress Skin: No rashes, lesions or ulcers. HEENT: Atraumatic, normocephalic, no obvious bleeding Lungs: Bilateral scattered wheezing present CVS: Regular rate and rhythm.  No murmur GI/Abd soft, nontender, nondistended, bowel sound present CNS: Alert, awake, oriented x3 Psychiatry: Mood appropriate Extremities: No pedal edema, no calf tenderness  Data Review: I have personally reviewed the laboratory data and studies available.  Recent Labs  Lab 03/19/21 0006 03/20/21 0325  WBC 6.4 10.1  NEUTROABS 5.3 8.3*  HGB 13.3 13.3  HCT 42.2 41.0  MCV 89.4 86.7  PLT 204 202   Recent Labs  Lab 03/19/21 0006 03/20/21 0325  NA 138 139  K 4.4 5.2*  CL 108 106  CO2 22 24  GLUCOSE 112* 128*  BUN 13 27*  CREATININE 1.67* 2.11*  CALCIUM 9.2 9.5    F/u  labs ordered Unresulted Labs (From admission, onward)     Start     Ordered   03/21/21 0500  CBC with Differential/Platelet  Daily,   R     Question:  Specimen collection method  Answer:  Lab=Lab collect   03/20/21 1349   03/21/21 7116  Basic metabolic panel  Daily,   R     Question:  Specimen collection method  Answer:  Lab=Lab collect   03/20/21 1349            Signed, Terrilee Croak, MD [facility] 03/20/2021

## 2021-03-20 NOTE — Plan of Care (Signed)
  Problem: Clinical Measurements: Goal: Diagnostic test results will improve Outcome: Progressing Goal: Respiratory complications will improve Outcome: Progressing   

## 2021-03-20 NOTE — Progress Notes (Signed)
  Echocardiogram 2D Echocardiogram has been performed.  Gregory Christensen 03/20/2021, 3:58 PM

## 2021-03-21 DIAGNOSIS — J441 Chronic obstructive pulmonary disease with (acute) exacerbation: Secondary | ICD-10-CM | POA: Diagnosis not present

## 2021-03-21 LAB — CBC WITH DIFFERENTIAL/PLATELET
Abs Immature Granulocytes: 0.06 10*3/uL (ref 0.00–0.07)
Basophils Absolute: 0 10*3/uL (ref 0.0–0.1)
Basophils Relative: 0 %
Eosinophils Absolute: 0 10*3/uL (ref 0.0–0.5)
Eosinophils Relative: 0 %
HCT: 40.5 % (ref 39.0–52.0)
Hemoglobin: 12.8 g/dL — ABNORMAL LOW (ref 13.0–17.0)
Immature Granulocytes: 1 %
Lymphocytes Relative: 9 %
Lymphs Abs: 0.8 10*3/uL (ref 0.7–4.0)
MCH: 28.1 pg (ref 26.0–34.0)
MCHC: 31.6 g/dL (ref 30.0–36.0)
MCV: 88.8 fL (ref 80.0–100.0)
Monocytes Absolute: 0.3 10*3/uL (ref 0.1–1.0)
Monocytes Relative: 3 %
Neutro Abs: 8 10*3/uL — ABNORMAL HIGH (ref 1.7–7.7)
Neutrophils Relative %: 87 %
Platelets: 190 10*3/uL (ref 150–400)
RBC: 4.56 MIL/uL (ref 4.22–5.81)
RDW: 14.2 % (ref 11.5–15.5)
WBC: 9.1 10*3/uL (ref 4.0–10.5)
nRBC: 0 % (ref 0.0–0.2)

## 2021-03-21 LAB — BASIC METABOLIC PANEL
Anion gap: 8 (ref 5–15)
BUN: 30 mg/dL — ABNORMAL HIGH (ref 8–23)
CO2: 24 mmol/L (ref 22–32)
Calcium: 8.5 mg/dL — ABNORMAL LOW (ref 8.9–10.3)
Chloride: 106 mmol/L (ref 98–111)
Creatinine, Ser: 1.9 mg/dL — ABNORMAL HIGH (ref 0.61–1.24)
GFR, Estimated: 38 mL/min — ABNORMAL LOW (ref >60)
Glucose, Bld: 152 mg/dL — ABNORMAL HIGH (ref 70–99)
Potassium: 4.8 mmol/L (ref 3.5–5.1)
Sodium: 138 mmol/L (ref 135–145)

## 2021-03-21 LAB — GLUCOSE, CAPILLARY
Glucose-Capillary: 148 mg/dL — ABNORMAL HIGH (ref 70–99)
Glucose-Capillary: 102 mg/dL — ABNORMAL HIGH (ref 70–99)

## 2021-03-21 MED ORDER — PREDNISONE 10 MG PO TABS: ORAL_TABLET | ORAL | 0 refills | Status: DC

## 2021-03-21 MED ORDER — PHENOL 1.4 % MT LIQD
1.0000 | OROMUCOSAL | Status: DC | PRN
  Administered 2021-03-21: 1 via OROMUCOSAL
  Filled 2021-03-21 (×2): qty 177

## 2021-03-21 NOTE — Plan of Care (Signed)
  Problem: Clinical Measurements: Goal: Diagnostic test results will improve Outcome: Adequate for Discharge   Problem: Clinical Measurements: Goal: Respiratory complications will improve Outcome: Adequate for Discharge   Problem: Clinical Measurements: Goal: Cardiovascular complication will be avoided Outcome: Adequate for Discharge   Problem: Activity: Goal: Risk for activity intolerance will decrease Outcome: Adequate for Discharge   Problem: Nutrition: Goal: Adequate nutrition will be maintained Outcome: Adequate for Discharge   Problem: Elimination: Goal: Will not experience complications related to bowel motility Outcome: Adequate for Discharge

## 2021-03-21 NOTE — Progress Notes (Signed)
Pt AVS given with new medicine and follow up appoinments. Instructed pt to ask the person picking him up to bring his portable Oxygen for transport. Pt said he's gonna be ok for the ride without his Oxygen Pt is aware about the risk for respiratory distress without his Oxygen.

## 2021-03-21 NOTE — Discharge Summary (Addendum)
Physician Discharge Summary  Gregory Christensen UTM:[id number] DOB: 1951/03/11 DOA: 03/18/2021  PCP: Leighton Ruff, MD  Admit date: 03/18/2021 Discharge date: 03/21/2021  Admitted From: Home Discharge disposition: Home   Code Status: Full Code   Discharge Diagnosis:   Principal Problem:   COPD with acute exacerbation ([place]) Active Problems:   Essential hypertension   Coronary artery calcification   Chronic anticoagulation   Elevated prostate specific antigen (PSA)   Type 2 diabetes mellitus (HCC)   Obstructive sleep apnea   Hyperlipidemia   Allergic rhinitis   CKD stage 3a   Grade I diastolic dysfunction   COPD (chronic obstructive pulmonary disease) ([place])    Chief Complaint  Patient presents with   Shortness of Breath    Brief narrative: Gregory Christensen is a 70 y.o. male with PMH significant for DM2, HTN, COPD on home oxygen, continues to smoke 1 pack/day, DVT/PE, CAD, CKD 3a, chronic iron deficiency anemia, osteoarthritis, GERD, nephrolithiasis, gout who lives at home with his mom and is ambulatory at baseline. Patient presented to the ED on 03/18/2021 with complaint of dyspnea progressively worsening for 1 to 2 days.  In the ED, patient had decreased air movement bilaterally with rhonchi and wheezing. Creatinine was elevated to 1.67 COVID PCR negative. Chest x-ray with hyperinflation, no infiltrates. He was admitted for COPD exacerbation. See below for details  Subjective: Patient was seen and examined this morning.  Pleasant elderly African-American male.  Lying on bed.  Not in distress.  Not on supplemental oxygen.  However has diffuse bilateral wheezing on auscultation. Also, blood work this morning showed elevated creatinine to 2.11 and elevated potassium level to 5.2.  Hospital course Acute COPD exacerbation Acute on chronic hypoxemic respiratory failure.  -Presented with worsening dyspnea.  On intermittent use of 2 L oxygen at home.  Continues to smoke.   -He  was treated with IV Solu-Medrol.  Currently on 40 mg IV twice daily.  Feels better.  Wheezing has improved.  We will discharge him on oral prednisone tapering course.  Not on antibiotics.   -Continue bronchodilators, incentive spirometry.  Encourage ambulation.  -Counseled to stop smoking.  AKI on CKD stage IIIa History of left renal cell cancer status post nephrectomy -Baseline creatinine 1.8 from June 2022. -In last 24 hours, creatinine has worsened to 2.11.  Unclear reason.  Not on diuretics. -Continue to monitor creatinine. 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  He was planned for a prostate biopsy yesterday on 9/19 as an outpatient which he missed because of hospitalization.  Eliquis was on hold for 3 days prior to the procedure.  Patient has now rescheduled the procedure for October 19.  At discharge, will resume Eliquis.  He is aware that he needs to hold it for 2 days prior to October 19.  Chronic diastolic CHF HTN -On amlodipine.  Not on diuretics or beta-blocker. -Echocardiogram 9/19 did not show any change compared to the one from 2019 with EF 55 to 60% and grade 1 diastolic dysfunction.    CAD/ HLD -Continue statin.  He mentions that previously was on Lipitor which was switched to pravastatin by his PCP as an outpatient.  ?Type 2 diabetes mellitus -A1c 5.6 on 03/19/2021 -Not  on any antidiabetic regimen at home. -Blood sugar level remains controlled under 150 consistently. Recent Labs  Lab 03/20/21 1238 03/20/21 1717 03/20/21 2044 03/21/21 0742 03/21/21 1210  GLUCAP 141* 166* 148* 148* 102*   Chronic daily smoker -Smokes 1 pack/day.  Counseled to quit.  Nicotine patch offered.   BPH -Flomax.   Allergies as of  03/21/2021   No Known Allergies      Medication List     STOP taking these medications    amoxicillin-clavulanate 875-125 MG tablet Commonly known as: AUGMENTIN   atorvastatin 40 MG tablet Commonly known as: LIPITOR   budesonide-formoterol 160-4.5 MCG/ACT inhaler Commonly known as: SYMBICORT   cephALEXin 500 MG capsule Commonly known as: KEFLEX   fluticasone 50 MCG/ACT nasal spray Commonly known as: FLONASE   HYDROcodone-acetaminophen 5-325 MG tablet Commonly known as: NORCO/VICODIN   methocarbamol 500 MG tablet Commonly known as: Robaxin       TAKE these medications    albuterol 108 (90 Base) MCG/ACT inhaler Commonly known as: VENTOLIN HFA Inhale 2 puffs into the lungs every 2 (two) hours as needed for wheezing or shortness of breath.   albuterol (2.5 MG/3ML) 0.083% nebulizer solution Commonly known as: PROVENTIL Take 3 mLs (2.5 mg total) by nebulization every 6 (six) hours as needed for wheezing or shortness of breath.   amLODipine 2.5 MG tablet Commonly known as: NORVASC Take 2.5 mg by mouth daily.   apixaban 5 MG Tabs tablet Commonly known as: Eliquis Take 1 tablet (5 mg total) by mouth 2 (two) times daily.   Artificial Tears 0.1-0.3 % Soln Generic drug: Dextran 70-Hypromellose Place 1 drop into both eyes daily as needed (dry eyes).   ciprofloxacin 500 MG tablet Commonly known as: CIPRO Take 500 mg by mouth as directed. TAKE ONE TABLET BY MOUTH TWICE A DAY (MONITOR and REPORT ANY SUDDEN ONSET TENDON PAIN) TAKE THE DAY BEFORE, THE DAY OF, AND THE DAY AFTER PROSTATE BIOPSY   diclofenac Sodium 1 % Gel Commonly known as: VOLTAREN Apply 2 g topically 2 (two) times daily as needed (Pain).   diphenhydrAMINE 25 MG tablet Commonly known as: SOMINEX Take 25 mg by mouth daily as needed for itching.   docusate sodium 100 MG capsule Commonly known as: COLACE Take 100 mg by mouth daily as needed for mild constipation.   gabapentin 300 MG capsule Commonly  known as: NEURONTIN Take 300-600 mg by mouth See admin instructions. Takes 1 capsule in the morning and 2 capsule at night   levocetirizine 5 MG tablet Commonly known as: XYZAL Take 5 mg by mouth daily.   lidocaine 5 % Commonly known as: LIDODERM Place 1 patch onto the skin every 12 (twelve) hours as needed (pain).   montelukast 10 MG tablet Commonly known as: SINGULAIR Take 10 mg by mouth at bedtime.   pravastatin 20 MG tablet Commonly known as: PRAVACHOL Take 20 mg by mouth daily.   predniSONE 10 MG tablet Commonly known as: DELTASONE Take 4 tabs twice a day for 2 days, then take 4 tabs daily for 2 days, then take 3 tabs daily for 2 days, then take 2 tabs daily for 2 days, then take 1 tab daily for 2 days, then STOP. What changed:  medication strength how much to take how to take this when to take this additional instructions   sildenafil 100 MG tablet Commonly known as: VIAGRA Take 100 mg by mouth daily as needed for erectile dysfunction.   Spiriva Respimat 2.5 MCG/ACT Aers Generic  drug: Tiotropium Bromide Monohydrate Inhale 2 puffs into the lungs daily.   tamsulosin 0.4 MG Caps capsule Commonly known as: FLOMAX Take 0.4 mg by mouth daily.   Wixela Inhub 250-50 MCG/ACT Aepb Generic drug: fluticasone-salmeterol Inhale 1 puff into the lungs in the morning and at bedtime.        Discharge Instructions:  Diet Recommendation:  Discharge Diet Orders (From admission, onward)     Start     Ordered   03/21/21 0000  Diet - low sodium heart healthy        03/21/21 1314              Follow with Primary MD Leighton Ruff, MD in 7 days   Get CBC/BMP checked in next visit within 1 week by PCP or SNF MD ( we routinely change or add medications that can affect your baseline labs and fluid status, therefore we recommend that you get the mentioned basic workup next visit with your PCP, your PCP may decide not to get them or add new tests based on their clinical  decision)  On your next visit with your PCP, please Get Medicines reviewed and adjusted.  Please request your PCP  to go over all Hospital Tests and Procedure/Radiological results at the follow up, please get all Hospital records sent to your Prim MD by signing hospital release before you go home.  Activity: As tolerated with Full fall precautions use walker/cane & assistance as needed  For Heart failure patients - Check your Weight same time everyday, if you gain over 2 pounds, or you develop in leg swelling, experience more shortness of breath or chest pain, call your Primary MD immediately. Follow Cardiac Low Salt Diet and 1.5 lit/day fluid restriction.  If you have smoked or chewed Tobacco in the last 2 yrs please stop smoking, stop any regular Alcohol  and or any Recreational drug use.  If you experience worsening of your admission symptoms, develop shortness of breath, life threatening emergency, suicidal or homicidal thoughts you must seek medical attention immediately by calling 911 or calling your MD immediately  if symptoms less severe.  You Must read complete instructions/literature along with all the possible adverse reactions/side effects for all the Medicines you take and that have been prescribed to you. Take any new Medicines after you have completely understood and accpet all the possible adverse reactions/side effects.   Do not drive, operate heavy machinery, perform activities at heights, swimming or participation in water activities or provide baby sitting services if your were admitted for syncope or siezures until you have seen by Primary MD or a Neurologist and advised to do so again.  Do not drive when taking Pain medications.  Do not take more than prescribed Pain, Sleep and Anxiety Medications  Wear Seat belts while driving.   Please note You were cared for by a hospitalist during your hospital stay. If you have any questions about your discharge medications or the  care you received while you were in the hospital after you are discharged, you can call the unit and asked to speak with the hospitalist on call if the hospitalist that took care of you is not available. Once you are discharged, your primary care physician will handle any further medical issues. Please note that NO REFILLS for any discharge medications will be authorized once you are discharged, as it is imperative that you return to your primary care physician (or establish a relationship with a primary care physician if you  do not have one) for your aftercare needs so that they can reassess your need for medications and monitor your lab values.    Follow ups:    Follow-up Information     Leighton Ruff, MD Follow up.   Specialty: Family Medicine Contact information: [place] [address] [phone]         Nahser, Wonda Cheng, MD .   Specialty: Cardiology Contact information: [address] [place] [address] [phone]                 Wound care:   Incision (Closed) 04/12/20 Shoulder Left (Active)  Date First Assessed/Time First Assessed: 04/12/20 1832   Location: Shoulder  Location Orientation: Left    Assessments 04/12/2020  6:30 PM 04/12/2020  7:29 PM  Dressing Type Gauze (Comment);Transparent dressing Gauze (Comment);Transparent dressing  Dressing Clean;Dry;Intact Clean;Dry;Intact  Site / Wound Assessment Dressing in place / Unable to assess Dressing in place / Unable to assess  Drainage Amount None None     No Linked orders to display    Discharge Exam:   Vitals:   03/20/21 2040 03/21/21 0507 03/21/21 0816 03/21/21 1127  BP: 122/67 (!) 151/94    Pulse: 69 88    Resp: 20 20    Temp: 98 F (36.7 C) 98.1 F (36.7 C)    TempSrc: Oral Oral    SpO2: 97% 96% 96% 96%  Weight:      Height:        Body mass index is 21.77 kg/m.  General exam: Pleasant, elderly African-American male.  Not in distress.  No new  symptoms Skin: No rashes, lesions or ulcers. HEENT: Atraumatic, normocephalic, no obvious bleeding Lungs: Bilateral scattered end expiratory wheezing.  Improved compared to yesterday.  No crackles.  No cough on deep breathing. CVS: Regular rate and rhythm, no murmur GI/Abd soft, nontender, nondistended, bowel sound present CNS: Alert, awake, oriented x3 Psychiatry: Mood appropriate Extremities: No pedal edema, no calf tenderness  Time coordinating discharge: 35 minutes   The results of significant diagnostics from this hospitalization (including imaging, microbiology, ancillary and laboratory) are listed below for reference.    Procedures and Diagnostic Studies:   DG Chest 2 View  Result Date: 03/18/2021 CLINICAL DATA:  Hx of CAD, COPD, emphysema, pna, PE, ex smoker. EXAM: CHEST - 2 VIEW COMPARISON:  Cxr 06/20/20 FINDINGS: The heart and mediastinal contours are within normal limits. Atherosclerotic plaque. Slightly more lucent right mid lung zone likely due to patient positioning. No focal consolidation. No pulmonary edema. No pleural effusion. No pneumothorax. No acute osseous abnormality. IMPRESSION: 1. No active cardiopulmonary disease. 2. Aortic Atherosclerosis (ICD10-I70.0) and Emphysema (ICD10-J43.9). Electronically Signed   By: Iven Finn M.D.   On: 03/18/2021 23:57     Labs:   Basic Metabolic Panel: Recent Labs  Lab 03/19/21 0006 03/20/21 0325 03/21/21 0404  NA 138 139 138  K 4.4 5.2* 4.8  CL 108 106 106  CO2 22 24 24   GLUCOSE 112* 128* 152*  BUN 13 27* 30*  CREATININE 1.67* 2.11* 1.90*  CALCIUM 9.2 9.5 8.5*   GFR Estimated Creatinine Clearance: 38.8 mL/min (A) (by C-G formula based on SCr of 1.9 mg/dL (H)). Liver Function Tests: No results for input(s): AST, ALT, ALKPHOS, BILITOT, PROT, ALBUMIN in the last 168 hours. No results for input(s): LIPASE, AMYLASE in the last 168 hours. No results for input(s): AMMONIA in the last 168 hours. Coagulation  profile No results for input(s):  INR, PROTIME in the last 168 hours.  CBC: Recent Labs  Lab 03/19/21 0006 03/20/21 0325 03/21/21 0404  WBC 6.4 10.1 9.1  NEUTROABS 5.3 8.3* 8.0*  HGB 13.3 13.3 12.8*  HCT 42.2 41.0 40.5  MCV 89.4 86.7 88.8  PLT 204 202 190   Cardiac Enzymes: No results for input(s): CKTOTAL, CKMB, CKMBINDEX, TROPONINI in the last 168 hours. BNP: Invalid input(s): POCBNP CBG: Recent Labs  Lab 03/20/21 1238 03/20/21 1717 03/20/21 2044 03/21/21 0742 03/21/21 1210  GLUCAP 141* 166* 148* 148* 102*   D-Dimer No results for input(s): DDIMER in the last 72 hours. Hgb A1c Recent Labs    03/19/21 0006  HGBA1C 5.6   Lipid Profile No results for input(s): CHOL, HDL, LDLCALC, TRIG, CHOLHDL, LDLDIRECT in the last 72 hours. Thyroid function studies No results for input(s): TSH, T4TOTAL, T3FREE, THYROIDAB in the last 72 hours.  Invalid input(s): FREET3 Anemia work up No results for input(s): VITAMINB12, FOLATE, FERRITIN, TIBC, IRON, RETICCTPCT in the last 72 hours. Microbiology Recent Results (from the past 240 hour(s))  Resp Panel by RT-PCR (Flu A&B, Covid) Nasopharyngeal Swab     Status: None   Collection Time: 03/19/21  4:27 AM   Specimen: Nasopharyngeal Swab; Nasopharyngeal(NP) swabs in vial transport medium  Result Value Ref Range Status   SARS Coronavirus 2 by RT PCR NEGATIVE NEGATIVE Final    Comment: (NOTE) SARS-CoV-2 target nucleic acids are NOT DETECTED.  The SARS-CoV-2 RNA is generally detectable in upper respiratory specimens during the acute phase of infection. The lowest concentration of SARS-CoV-2 viral copies this assay can detect is 138 copies/mL. A negative result does not preclude SARS-Cov-2 infection and should not be used as the sole basis for treatment or other patient management decisions. A negative result may occur with  improper specimen collection/handling, submission of specimen other than nasopharyngeal swab, presence of viral  mutation(s) within the areas targeted by this assay, and inadequate number of viral copies(<138 copies/mL). A negative result must be combined with clinical observations, patient history, and epidemiological information. The expected result is Negative.  Fact Sheet for Patients:  EntrepreneurPulse.com.au  Fact Sheet for Healthcare Providers:  IncredibleEmployment.be  This test is no t yet approved or cleared by the [place] FDA and  has been authorized for detection and/or diagnosis of SARS-CoV-2 by FDA under an Emergency Use Authorization (EUA). This EUA will remain  in effect (meaning this test can be used) for the duration of the COVID-19 declaration under Section 564(b)(1) of the Act, 21 U.S.C.section 360bbb-3(b)(1), unless the authorization is terminated  or revoked sooner.       Influenza A by PCR NEGATIVE NEGATIVE Final   Influenza B by PCR NEGATIVE NEGATIVE Final    Comment: (NOTE) The Xpert Xpress SARS-CoV-2/FLU/RSV plus assay is intended as an aid in the diagnosis of influenza from Nasopharyngeal swab specimens and should not be used as a sole basis for treatment. Nasal washings and aspirates are unacceptable for Xpert Xpress SARS-CoV-2/FLU/RSV testing.  Fact Sheet for Patients: EntrepreneurPulse.com.au  Fact Sheet for Healthcare Providers: IncredibleEmployment.be  This test is not yet approved or cleared by the [place] FDA and has been authorized for detection and/or diagnosis of SARS-CoV-2 by FDA under an Emergency Use Authorization (EUA). This EUA will remain in effect (meaning this test can be used) for the duration of the COVID-19 declaration under Section 564(b)(1) of the Act, 21 U.S.C. section 360bbb-3(b)(1), unless the authorization is terminated or revoked.  Performed at [facility],  [place] [address] [place], [place] [address]      Signed: Marlowe Aschoff  Kimberli Winne  [facility] 03/21/2021, 2:17 PM

## 2021-03-21 NOTE — Plan of Care (Signed)
  Problem: Clinical Measurements: Goal: Diagnostic test results will improve Outcome: Progressing Goal: Respiratory complications will improve Outcome: Progressing   Problem: Activity: Goal: Risk for activity intolerance will decrease Outcome: Adequate for Discharge   Problem: Nutrition: Goal: Adequate nutrition will be maintained Outcome: Adequate for Discharge   

## 2021-03-28 DIAGNOSIS — L739 Follicular disorder, unspecified: Secondary | ICD-10-CM | POA: Diagnosis not present

## 2021-03-28 DIAGNOSIS — Z23 Encounter for immunization: Secondary | ICD-10-CM | POA: Diagnosis not present

## 2021-03-28 DIAGNOSIS — J9611 Chronic respiratory failure with hypoxia: Secondary | ICD-10-CM | POA: Diagnosis not present

## 2021-03-28 DIAGNOSIS — I10 Essential (primary) hypertension: Secondary | ICD-10-CM | POA: Diagnosis not present

## 2021-03-28 DIAGNOSIS — N1832 Chronic kidney disease, stage 3b: Secondary | ICD-10-CM | POA: Diagnosis not present

## 2021-03-28 DIAGNOSIS — Z87891 Personal history of nicotine dependence: Secondary | ICD-10-CM | POA: Diagnosis not present

## 2021-03-28 DIAGNOSIS — E7800 Pure hypercholesterolemia, unspecified: Secondary | ICD-10-CM | POA: Diagnosis not present

## 2021-03-28 DIAGNOSIS — I2510 Atherosclerotic heart disease of native coronary artery without angina pectoris: Secondary | ICD-10-CM | POA: Diagnosis not present

## 2021-03-28 DIAGNOSIS — J449 Chronic obstructive pulmonary disease, unspecified: Secondary | ICD-10-CM | POA: Diagnosis not present

## 2021-03-28 DIAGNOSIS — Z7901 Long term (current) use of anticoagulants: Secondary | ICD-10-CM | POA: Diagnosis not present

## 2021-03-28 DIAGNOSIS — Z905 Acquired absence of kidney: Secondary | ICD-10-CM | POA: Diagnosis not present

## 2021-04-04 DIAGNOSIS — J441 Chronic obstructive pulmonary disease with (acute) exacerbation: Secondary | ICD-10-CM | POA: Diagnosis not present

## 2021-04-07 DIAGNOSIS — C652 Malignant neoplasm of left renal pelvis: Secondary | ICD-10-CM | POA: Diagnosis not present

## 2021-04-11 DIAGNOSIS — N1832 Chronic kidney disease, stage 3b: Secondary | ICD-10-CM | POA: Diagnosis not present

## 2021-04-11 DIAGNOSIS — K219 Gastro-esophageal reflux disease without esophagitis: Secondary | ICD-10-CM | POA: Diagnosis not present

## 2021-04-11 DIAGNOSIS — N400 Enlarged prostate without lower urinary tract symptoms: Secondary | ICD-10-CM | POA: Diagnosis not present

## 2021-04-11 DIAGNOSIS — E7800 Pure hypercholesterolemia, unspecified: Secondary | ICD-10-CM | POA: Diagnosis not present

## 2021-04-11 DIAGNOSIS — J441 Chronic obstructive pulmonary disease with (acute) exacerbation: Secondary | ICD-10-CM | POA: Diagnosis not present

## 2021-04-11 DIAGNOSIS — M1990 Unspecified osteoarthritis, unspecified site: Secondary | ICD-10-CM | POA: Diagnosis not present

## 2021-04-11 DIAGNOSIS — J449 Chronic obstructive pulmonary disease, unspecified: Secondary | ICD-10-CM | POA: Diagnosis not present

## 2021-04-11 DIAGNOSIS — I10 Essential (primary) hypertension: Secondary | ICD-10-CM | POA: Diagnosis not present

## 2021-04-13 ENCOUNTER — Other Ambulatory Visit: Payer: Self-pay

## 2021-04-13 ENCOUNTER — Ambulatory Visit (INDEPENDENT_AMBULATORY_CARE_PROVIDER_SITE_OTHER): Payer: No Typology Code available for payment source | Admitting: Pulmonary Disease

## 2021-04-13 ENCOUNTER — Encounter: Payer: Self-pay | Admitting: Pulmonary Disease

## 2021-04-13 VITALS — BP 140/60 | HR 93 | Temp 98.6°F | Ht 73.0 in | Wt 168.2 lb

## 2021-04-13 DIAGNOSIS — J9611 Chronic respiratory failure with hypoxia: Secondary | ICD-10-CM

## 2021-04-13 DIAGNOSIS — Z87891 Personal history of nicotine dependence: Secondary | ICD-10-CM | POA: Diagnosis not present

## 2021-04-13 DIAGNOSIS — J432 Centrilobular emphysema: Secondary | ICD-10-CM

## 2021-04-13 DIAGNOSIS — J449 Chronic obstructive pulmonary disease, unspecified: Secondary | ICD-10-CM | POA: Diagnosis not present

## 2021-04-13 MED ORDER — ALBUTEROL SULFATE HFA 108 (90 BASE) MCG/ACT IN AERS: 2.0000 | INHALATION_SPRAY | RESPIRATORY_TRACT | 2 refills | Status: DC | PRN

## 2021-04-13 NOTE — Patient Instructions (Addendum)
Continue wixela inhaler 1 puff twice daily - rinse mouth out after each use  Continue to use spiriva respimat 2 puffs daily  Continue to use albuterol inhaler or nebulizer treatments as needed  We will refer you to our lung cancer screening program for further follow up.  Come back to clinic in 2 weeks to have labs checked.

## 2021-04-13 NOTE — Progress Notes (Signed)
Synopsis: Referred in October 2022 for COPD by Latricia Heft, MD  Subjective:   PATIENT ID: Gregory Christensen GENDER: male DOB: 07/29/1950, MRN: [id number]   HPI  Chief Complaint  Patient presents with   Consult    COPD consult   Gregory Christensen is a 70 year old male, former smoker with chronic respiratory failure, COPD, DVT/PE, sleep apnea and chronic kidney disease who is referred to pulmonary clinic for COPD.  He has been followed at the [facility] and sent to our clinic for further monitoring.  He is currently using Advair and Spiriva along with albuterol 1-2 times per day.  He has complaints of dyspnea and wheezing.  He does have a nebulizer machine at home which he uses about 2 times per week.  He does complain of waking up at night with shortness of breath.  He denies any snoring.  He quit smoking in November 2021 and has been smoking since he was 13 or 70 years old.  At one point he was smoking 4 packs/day.  He has over a 20-pack-year smoking history.  Pulmonary function test from 2016 showed an FEV1 of 1.06 L (33%), ratio 51, DLCO 51%.  Alpha 1 antitrypsin level in 2019 was normal.  He was referred to pulmonary rehab in 08/2019 but did not attend.  He is on Eliquis for history of DVT/PE.  Past Medical History:  Diagnosis Date   Acute deep vein thrombosis (DVT) of left lower extremity (HCC)    Acute hypoxemic respiratory failure (HCC)    Anemia    Iron deficiency   Anxiety    Aortic atherosclerosis (HCC)    Arthritis    Benign prostatic hyperplasia 08/28/2018   UNSPECIFIED WHETHER LOWER URINARY TRACT SYMPTOMS PRESENT   CAD (coronary artery disease)    Cancer (HCC)    Chronic back pain    Chronic hip pain    CKD stage 3a 03/19/2021   Constipation    due to Iron M-W-F   COPD exacerbation ([place]) 10/18/2017   Dyspnea    Emphysema lung (HCC)    Essential hypertension    GERD (gastroesophageal reflux disease)    Gout    Gross hematuria 09/08/2018   Head injury, acute, with loss of  consciousness ([place])    unsure how long he was unresponsive   History of blood transfusion 05/2019   History of kidney stones    History of tobacco abuse    Hypoxia    Insomnia    Oxygen dependent    2l- 24/7   Pneumonia    in 1980's   Pre-diabetes    Pulmonary embolism ([place]) 05/31/2018   Renal mass    CONCERNING FOR RENAL CELL CARCINOMA DR. Lovena Neighbours   Respiratory distress    Respiratory failure, acute-on-chronic ([place]) 08/03/2018   Seasonal allergies    Sleep apnea      Family History  Problem Relation Age of Onset   Diabetes Mother    Hypertension Sister    Hypertension Brother      Social History   Socioeconomic History   Marital status: Divorced    Spouse name: Not on file   Number of children: Not on file   Years of education: Not on file   Highest education level: Not on file  Occupational History   Not on file  Tobacco Use   Smoking status: Former    Packs/day: 0.50    Years: 57.00    Pack years: 28.50    Types: Cigarettes  Smokeless tobacco: Never  Vaping Use   Vaping Use: Never used  Substance and Sexual Activity   Alcohol use: Not Currently    Alcohol/week: 10.0 standard drinks    Types: 10 Glasses of wine per week    Comment: 0-2 beers a day    Drug use: Not Currently    Types: Flunitrazepam   Sexual activity: Not on file  Other Topics Concern   Not on file  Social History Narrative   Not on file   Social Determinants of Health   Financial Resource Strain: Not on file  Food Insecurity: Not on file  Transportation Needs: Not on file  Physical Activity: Not on file  Stress: Not on file  Social Connections: Not on file  Intimate Partner Violence: Not on file     No Known Allergies   Outpatient Medications Prior to Visit  Medication Sig Dispense Refill   albuterol (PROVENTIL) (2.5 MG/3ML) 0.083% nebulizer solution Take 3 mLs (2.5 mg total) by nebulization every 6 (six) hours as needed for wheezing or shortness of breath. 75 mL 12    amLODipine (NORVASC) 2.5 MG tablet Take 2.5 mg by mouth daily.     apixaban (ELIQUIS) 5 MG TABS tablet Take 1 tablet (5 mg total) by mouth 2 (two) times daily. 60 tablet 1   atorvastatin (LIPITOR) 40 MG tablet TAKE ONE TABLET BY MOUTH AT BEDTIME FOR CHOLESTEROL     Dextran 70-Hypromellose (ARTIFICIAL TEARS) 0.1-0.3 % SOLN Place 1 drop into both eyes daily as needed (dry eyes).     diclofenac Sodium (VOLTAREN) 1 % GEL Apply 2 g topically 2 (two) times daily as needed (Pain).     docusate sodium (COLACE) 100 MG capsule Take 100 mg by mouth daily as needed for mild constipation.     fluticasone-salmeterol (ADVAIR) 250-50 MCG/ACT AEPB Inhale 1 puff into the lungs in the morning and at bedtime.     gabapentin (NEURONTIN) 300 MG capsule Take 300-600 mg by mouth See admin instructions. Takes 1 capsule in the morning and 2 capsule at night     levocetirizine (XYZAL) 5 MG tablet Take 5 mg by mouth daily.     lidocaine (LIDODERM) 5 % Place 1 patch onto the skin every 12 (twelve) hours as needed (pain).     montelukast (SINGULAIR) 10 MG tablet Take 10 mg by mouth at bedtime.     pravastatin (PRAVACHOL) 20 MG tablet Take 20 mg by mouth daily.     sildenafil (VIAGRA) 100 MG tablet Take 100 mg by mouth daily as needed for erectile dysfunction.     tamsulosin (FLOMAX) 0.4 MG CAPS capsule Take 0.4 mg by mouth daily.     Tiotropium Bromide Monohydrate (SPIRIVA RESPIMAT) 2.5 MCG/ACT AERS Inhale 2 puffs into the lungs daily.     albuterol (PROVENTIL HFA;VENTOLIN HFA) 108 (90 Base) MCG/ACT inhaler Inhale 2 puffs into the lungs every 2 (two) hours as needed for wheezing or shortness of breath. 1 Inhaler 2   ciprofloxacin (CIPRO) 500 MG tablet Take 500 mg by mouth as directed. TAKE ONE TABLET BY MOUTH TWICE A DAY (MONITOR and REPORT ANY SUDDEN ONSET TENDON PAIN) TAKE THE DAY BEFORE, THE DAY OF, AND THE DAY AFTER PROSTATE BIOPSY (Patient not taking: Reported on 04/13/2021)     diphenhydrAMINE (SOMINEX) 25 MG tablet Take  25 mg by mouth daily as needed for itching. (Patient not taking: Reported on 04/13/2021)     predniSONE (DELTASONE) 10 MG tablet Take 4 tabs twice a day  for 2 days, then take 4 tabs daily for 2 days, then take 3 tabs daily for 2 days, then take 2 tabs daily for 2 days, then take 1 tab daily for 2 days, then STOP. (Patient not taking: Reported on 04/13/2021) 40 tablet 0   No facility-administered medications prior to visit.    Review of Systems  Constitutional:  Negative for chills, fever, malaise/fatigue and weight loss.  HENT:  Positive for congestion. Negative for sinus pain and sore throat.   Eyes: Negative.   Respiratory:  Positive for cough, sputum production, shortness of breath and wheezing. Negative for hemoptysis.   Cardiovascular:  Positive for chest pain. Negative for palpitations, orthopnea, claudication and leg swelling.  Gastrointestinal:  Negative for abdominal pain, heartburn, nausea and vomiting.  Genitourinary: Negative.   Musculoskeletal:  Positive for joint pain. Negative for myalgias.  Skin:  Negative for rash.  Neurological:  Negative for weakness.  Endo/Heme/Allergies: Negative.   Psychiatric/Behavioral: Negative.     Objective:   Vitals:   04/13/21 0903  BP: 140/60  Pulse: 93  Temp: 98.6 F (37 C)  TempSrc: Oral  SpO2: 98%  Weight: 168 lb 3.2 oz (76.3 kg)  Height: 6\' 1"  (1.854 m)    Physical Exam Constitutional:      General: He is not in acute distress. HENT:     Head: Normocephalic and atraumatic.  Eyes:     Extraocular Movements: Extraocular movements intact.     Conjunctiva/sclera: Conjunctivae normal.     Pupils: Pupils are equal, round, and reactive to light.  Cardiovascular:     Rate and Rhythm: Normal rate and regular rhythm.     Pulses: Normal pulses.     Heart sounds: Normal heart sounds. No murmur heard. Pulmonary:     Breath sounds: Decreased air movement present. No wheezing, rhonchi or rales.  Abdominal:     General: Bowel sounds  are normal.     Palpations: Abdomen is soft.  Musculoskeletal:     Right lower leg: No edema.     Left lower leg: No edema.  Lymphadenopathy:     Cervical: No cervical adenopathy.  Skin:    General: Skin is warm and dry.  Neurological:     General: No focal deficit present.     Mental Status: He is alert.  Psychiatric:        Mood and Affect: Mood normal.        Behavior: Behavior normal.        Thought Content: Thought content normal.        Judgment: Judgment normal.    CBC    Component Value Date/Time   WBC 4.4 04/24/2021 0930   RBC 4.69 04/24/2021 0930   HGB 12.9 (L) 04/24/2021 0930   HCT 40.8 04/24/2021 0930   HCT 22.4 (L) 09/25/2019 0422   PLT 264.0 04/24/2021 0930   MCV 87.1 04/24/2021 0930   MCH 28.1 03/21/2021 0404   MCHC 31.7 04/24/2021 0930   RDW 15.0 04/24/2021 0930   LYMPHSABS 2.0 04/24/2021 0930   MONOABS 0.5 04/24/2021 0930   EOSABS 0.1 04/24/2021 0930   BASOSABS 0.0 04/24/2021 0930   BMP Latest Ref Rng & Units 03/21/2021 03/20/2021 03/19/2021  Glucose 70 - 99 mg/dL 152(H) 128(H) 112(H)  BUN 8 - 23 mg/dL 30(H) 27(H) 13  Creatinine 0.61 - 1.24 mg/dL 1.90(H) 2.11(H) 1.67(H)  Sodium 135 - 145 mmol/L 138 139 138  Potassium 3.5 - 5.1 mmol/L 4.8 5.2(H) 4.4  Chloride 98 - 111  mmol/L 106 106 108  CO2 22 - 32 mmol/L 24 24 22   Calcium 8.9 - 10.3 mg/dL 8.5(L) 9.5 9.2   Chest imaging: CXR 03/18/21 The heart and mediastinal contours are within normal limits. Atherosclerotic plaque. Slightly more lucent right mid lung zone likely due to patient positioning. No focal consolidation. No pulmonary edema. No pleural effusion. No pneumothorax. No acute osseous abnormality  CT Abdomen 09/25/19 Lower Chest: emphysematous changes at the lung bases bilaterally.  PFT: No flowsheet data found.  [facility]: FEV1 1.06L (33%), ratio 51 and DLCO 51%  Labs: 2019: Alpha 1 Antitrypsin phenotype is MM  Echo 03/20/21: LV EF 60-65%. RV systolic function is low normal. RV  size is mildly enlarged.  Assessment & Plan:   Centrilobular emphysema ([place]) - Plan: CBC with Differential, IgE  Chronic obstructive pulmonary disease, unspecified COPD type ([place]) - Plan: albuterol (VENTOLIN HFA) 108 (90 Base) MCG/ACT inhaler  Chronic hypoxemic respiratory failure ([place])  Former smoker - Plan: Ambulatory Referral for Lung Cancer Scre  Discussion: Gregory Christensen is a 70 year old male, former smoker with chronic respiratory failure, COPD, DVT/PET, sleep apnea and chronic kidney disease who is referred to pulmonary clinic for COPD.  Patient has severe obstructive lung disease due to pulmonary emphysema and moderate diffusion defect. He has chronic respiratory failure that requires 2L of supplemental O2 which he uses day/night. He reports having a sleep study 2-3 years ago which was negative for obstructive sleep apnea.   He is on ICS/LABA/LAMA therapy via a combination of wixela 250-53mcg 1 puff twice daily and spiriva 2.44mcg 2 puffs daily. He is using as needed albuterol inhaler and nebulizer solution daily. He is to continue on this regimen.   We will check CBC with diff and IgE level to consider biologic therapy in the future if he has frequent exacerbations.  We will refer him to our lung cancer screening program.   Follow up in 6 months.  Freda Jackson, MD [facility] Office: [phone]   See Amion for personal pager PCCM on call pager [phone] until 7pm. Please call Elink 7p-7a. [phone]     Current Outpatient Medications:    albuterol (PROVENTIL) (2.5 MG/3ML) 0.083% nebulizer solution, Take 3 mLs (2.5 mg total) by nebulization every 6 (six) hours as needed for wheezing or shortness of breath., Disp: 75 mL, Rfl: 12   amLODipine (NORVASC) 2.5 MG tablet, Take 2.5 mg by mouth daily., Disp: , Rfl:    apixaban (ELIQUIS) 5 MG TABS tablet, Take 1 tablet (5 mg total) by mouth 2 (two) times daily., Disp: 60 tablet, Rfl: 1    atorvastatin (LIPITOR) 40 MG tablet, TAKE ONE TABLET BY MOUTH AT BEDTIME FOR CHOLESTEROL, Disp: , Rfl:    Dextran 70-Hypromellose (ARTIFICIAL TEARS) 0.1-0.3 % SOLN, Place 1 drop into both eyes daily as needed (dry eyes)., Disp: , Rfl:    diclofenac Sodium (VOLTAREN) 1 % GEL, Apply 2 g topically 2 (two) times daily as needed (Pain)., Disp: , Rfl:    docusate sodium (COLACE) 100 MG capsule, Take 100 mg by mouth daily as needed for mild constipation., Disp: , Rfl:    fluticasone-salmeterol (ADVAIR) 250-50 MCG/ACT AEPB, Inhale 1 puff into the lungs in the morning and at bedtime., Disp: , Rfl:    gabapentin (NEURONTIN) 300 MG capsule, Take 300-600 mg by mouth See admin instructions. Takes 1 capsule in the morning and 2 capsule at night, Disp: , Rfl:    levocetirizine (XYZAL) 5 MG  tablet, Take 5 mg by mouth daily., Disp: , Rfl:    lidocaine (LIDODERM) 5 %, Place 1 patch onto the skin every 12 (twelve) hours as needed (pain)., Disp: , Rfl:    montelukast (SINGULAIR) 10 MG tablet, Take 10 mg by mouth at bedtime., Disp: , Rfl:    pravastatin (PRAVACHOL) 20 MG tablet, Take 20 mg by mouth daily., Disp: , Rfl:    sildenafil (VIAGRA) 100 MG tablet, Take 100 mg by mouth daily as needed for erectile dysfunction., Disp: , Rfl:    tamsulosin (FLOMAX) 0.4 MG CAPS capsule, Take 0.4 mg by mouth daily., Disp: , Rfl:    Tiotropium Bromide Monohydrate (SPIRIVA RESPIMAT) 2.5 MCG/ACT AERS, Inhale 2 puffs into the lungs daily., Disp: , Rfl:    albuterol (VENTOLIN HFA) 108 (90 Base) MCG/ACT inhaler, Inhale 2 puffs into the lungs every 2 (two) hours as needed for wheezing or shortness of breath., Disp: 1 each, Rfl: 2

## 2021-04-14 ENCOUNTER — Telehealth: Payer: Self-pay | Admitting: Pulmonary Disease

## 2021-04-14 DIAGNOSIS — R8279 Other abnormal findings on microbiological examination of urine: Secondary | ICD-10-CM | POA: Diagnosis not present

## 2021-04-14 DIAGNOSIS — R972 Elevated prostate specific antigen [PSA]: Secondary | ICD-10-CM | POA: Diagnosis not present

## 2021-04-14 NOTE — Telephone Encounter (Signed)
Call returned had to leave voicemail.   OV notes faxed.   Nothing further needed at this time.

## 2021-04-18 DIAGNOSIS — Z22322 Carrier or suspected carrier of Methicillin resistant Staphylococcus aureus: Secondary | ICD-10-CM | POA: Diagnosis not present

## 2021-04-18 DIAGNOSIS — Z905 Acquired absence of kidney: Secondary | ICD-10-CM | POA: Diagnosis not present

## 2021-04-18 DIAGNOSIS — I2510 Atherosclerotic heart disease of native coronary artery without angina pectoris: Secondary | ICD-10-CM | POA: Diagnosis not present

## 2021-04-18 DIAGNOSIS — J449 Chronic obstructive pulmonary disease, unspecified: Secondary | ICD-10-CM | POA: Diagnosis not present

## 2021-04-18 DIAGNOSIS — E7800 Pure hypercholesterolemia, unspecified: Secondary | ICD-10-CM | POA: Diagnosis not present

## 2021-04-18 DIAGNOSIS — Z7901 Long term (current) use of anticoagulants: Secondary | ICD-10-CM | POA: Diagnosis not present

## 2021-04-18 DIAGNOSIS — Z87891 Personal history of nicotine dependence: Secondary | ICD-10-CM | POA: Diagnosis not present

## 2021-04-18 DIAGNOSIS — J9611 Chronic respiratory failure with hypoxia: Secondary | ICD-10-CM | POA: Diagnosis not present

## 2021-04-18 DIAGNOSIS — I10 Essential (primary) hypertension: Secondary | ICD-10-CM | POA: Diagnosis not present

## 2021-04-24 ENCOUNTER — Other Ambulatory Visit (INDEPENDENT_AMBULATORY_CARE_PROVIDER_SITE_OTHER): Payer: No Typology Code available for payment source

## 2021-04-24 DIAGNOSIS — J432 Centrilobular emphysema: Secondary | ICD-10-CM | POA: Diagnosis not present

## 2021-04-24 LAB — CBC WITH DIFFERENTIAL/PLATELET
Basophils Absolute: 0 10*3/uL (ref 0.0–0.1)
Basophils Relative: 0.4 % (ref 0.0–3.0)
Eosinophils Absolute: 0.1 10*3/uL (ref 0.0–0.7)
Eosinophils Relative: 1.8 % (ref 0.0–5.0)
HCT: 40.8 % (ref 39.0–52.0)
Hemoglobin: 12.9 g/dL — ABNORMAL LOW (ref 13.0–17.0)
Lymphocytes Relative: 44.3 % (ref 12.0–46.0)
Lymphs Abs: 2 10*3/uL (ref 0.7–4.0)
MCHC: 31.7 g/dL (ref 30.0–36.0)
MCV: 87.1 fl (ref 78.0–100.0)
Monocytes Absolute: 0.5 10*3/uL (ref 0.1–1.0)
Monocytes Relative: 11.3 % (ref 3.0–12.0)
Neutro Abs: 1.9 10*3/uL (ref 1.4–7.7)
Neutrophils Relative %: 42.2 % — ABNORMAL LOW (ref 43.0–77.0)
Platelets: 264 10*3/uL (ref 150.0–400.0)
RBC: 4.69 Mil/uL (ref 4.22–5.81)
RDW: 15 % (ref 11.5–15.5)
WBC: 4.4 10*3/uL (ref 4.0–10.5)

## 2021-04-25 LAB — IGE: IgE (Immunoglobulin E), Serum: 311 kU/L — ABNORMAL HIGH (ref <=114)

## 2021-04-27 ENCOUNTER — Encounter: Payer: Self-pay | Admitting: Pulmonary Disease

## 2021-05-05 DIAGNOSIS — J441 Chronic obstructive pulmonary disease with (acute) exacerbation: Secondary | ICD-10-CM | POA: Diagnosis not present

## 2021-05-11 NOTE — Progress Notes (Signed)
Cardiology Office Note:    Date:  05/12/2021   ID:  Gregory Christensen, DOB [birth year], MRN [id number]  PCP:  Vernie Shanks, MD   [place] [facility] Cardiologist:  Werner Lean, MD     Referring MD: Leighton Ruff, MD   CC: told that he was to come here Consulted for the evaluation of HTN at the behest of Vernie Shanks, MD  History of Present Illness:    Gregory Christensen is a 70 y.o. male with a hx of CAD, HTN with DM, COPD who stopped smoking 05/2020, on Home O2, CPE and DVT on IDA and hx of GI bleed who presents as a new patient 05/12/21.  Distally saw Dr. Acie Fredrickson (during the pandemic through telemedicine only).  Patient notes that he is feeling fine.  Had rare tightness in his test that occurs during cold weather.  Tightness improves with rescue inhaler.  Tightness does not radiate.  Notes that he has profound DOE; if he walks around to his appointment he gets short of breath.  No shortness of breath or chest tightness at rest.  Had resent discharge 03/21/21 for mixed picture hypoxic respiratory failure.  Echo was unchanged at that time.  Amb BP: 130/70s or less; checks daily  Past Medical History:  Diagnosis Date   Acute deep vein thrombosis (DVT) of left lower extremity (HCC)    Acute hypoxemic respiratory failure (HCC)    Anemia    Iron deficiency   Anxiety    Aortic atherosclerosis (HCC)    Arthritis    Benign prostatic hyperplasia 08/28/2018   UNSPECIFIED WHETHER LOWER URINARY TRACT SYMPTOMS PRESENT   CAD (coronary artery disease)    Cancer (HCC)    Chronic back pain    Chronic hip pain    CKD stage 3a 03/19/2021   Constipation    due to Iron M-W-F   COPD exacerbation ([place]) 10/18/2017   Dyspnea    Emphysema lung ([place])    Essential hypertension    GERD (gastroesophageal reflux disease)    Gout    Gross hematuria 09/08/2018   Head injury, acute, with loss of consciousness ([place])    unsure how long he was unresponsive   History of blood transfusion  05/2019   History of kidney stones    History of tobacco abuse    Hypoxia    Insomnia    Oxygen dependent    2l- 24/7   Pneumonia    in 1980's   Pre-diabetes    Pulmonary embolism ([place]) 05/31/2018   Renal mass    CONCERNING FOR RENAL CELL CARCINOMA DR. Lovena Neighbours   Respiratory distress    Respiratory failure, acute-on-chronic ([place]) 08/03/2018   Seasonal allergies    Sleep apnea     Past Surgical History:  Procedure Laterality Date   COLONOSCOPY WITH PROPOFOL N/A 09/27/2019   Procedure: COLONOSCOPY WITH PROPOFOL;  Surgeon: Irene Shipper, MD;  Location: [facility];  Service: Endoscopy;  Laterality: N/A;   CYSTOSCOPY/URETEROSCOPY/HOLMIUM LASER/STENT PLACEMENT Left 10/31/2018   Procedure: CYSTOSCOPY/URETEROSCOPY;  Surgeon: Ceasar Mons, MD;  Location: [facility];  Service: Urology;  Laterality: Left;   ESOPHAGOGASTRODUODENOSCOPY (EGD) WITH PROPOFOL N/A 09/27/2019   Procedure: ESOPHAGOGASTRODUODENOSCOPY (EGD) WITH PROPOFOL;  Surgeon: Irene Shipper, MD;  [facility];  Service: Endoscopy;  Laterality: N/A;   HERNIA REPAIR Left 1977   inguinal   HYDROCELE EXCISION Left 05/13/2019   Procedure: HYDROCELECTOMY ADULT;  Surgeon: Ceasar Mons, MD;  Location[facility];  Service: Urology;  Laterality: Left;   [place]   EXTENSIVE   POLYPECTOMY  09/27/2019   Procedure: POLYPECTOMY;  Surgeon: Irene Shipper, MD;  [facility];  Service: Endoscopy;;   ROBOT ASSITED LAPAROSCOPIC NEPHROURETERECTOMY Left 10/31/2018   Procedure: XI ROBOT ASSITED LAPAROSCOPIC NEPHROURETERECTOMY;  Surgeon: Ceasar Mons, MD;  [facility];  Service: Urology;  Laterality: Left;  ONLY NEEDS 240 MIN FOR ALL PROCEDURES   SHOULDER ARTHROSCOPY WITH ROTATOR CUFF REPAIR Left 04/12/2020   Procedure: LEFT SHOULDER ARTHROSCOPY, DEBRIDEMENT, BICEPS TENODESIS, MINI OPEN ROTATOR CUFF TEAR REPAIR;  Surgeon: Meredith Pel, MD;  Location: [facility];  Service: Orthopedics;   Laterality: Left;    Current Medications: Current Meds  Medication Sig   albuterol (PROVENTIL) (2.5 MG/3ML) 0.083% nebulizer solution Take 3 mLs (2.5 mg total) by nebulization every 6 (six) hours as needed for wheezing or shortness of breath.   albuterol (VENTOLIN HFA) 108 (90 Base) MCG/ACT inhaler Inhale 2 puffs into the lungs every 2 (two) hours as needed for wheezing or shortness of breath.   amLODipine (NORVASC) 2.5 MG tablet Take 2.5 mg by mouth daily.   apixaban (ELIQUIS) 5 MG TABS tablet Take 1 tablet (5 mg total) by mouth 2 (two) times daily.   ascorbic acid (VITAMIN C) 500 MG tablet in the morning and at bedtime.   atorvastatin (LIPITOR) 80 MG tablet Take 1 tablet (80 mg total) by mouth daily.   chlorhexidine (HIBICLENS) 4 % external liquid See admin instructions.   Dextran 70-Hypromellose (ARTIFICIAL TEARS) 0.1-0.3 % SOLN Place 1 drop into both eyes daily as needed (dry eyes).   diclofenac Sodium (VOLTAREN) 1 % GEL Apply 2 g topically 2 (two) times daily as needed (Pain).   diltiazem (CARDIZEM) 30 MG tablet Take 2 hours prior to Cardiac CT   docusate sodium (COLACE) 100 MG capsule Take 100 mg by mouth daily as needed for mild constipation.   fluticasone (FLONASE) 50 MCG/ACT nasal spray Place into both nostrils as needed for allergies or rhinitis.   fluticasone-salmeterol (WIXELA INHUB) 250-50 MCG/ACT AEPB Inhale 1 puff into the lungs in the morning and at bedtime.   gabapentin (NEURONTIN) 300 MG capsule Take 300-600 mg by mouth See admin instructions. Takes 1 capsule in the morning and 2 capsule at night   levocetirizine (XYZAL) 5 MG tablet Take 5 mg by mouth daily.   lidocaine (LIDODERM) 5 % Place 1 patch onto the skin every 12 (twelve) hours as needed (pain).   methenamine (HIPREX) 1 g tablet in the morning and at bedtime.   montelukast (SINGULAIR) 10 MG tablet Take 10 mg by mouth at bedtime.   mupirocin ointment (BACTROBAN) 2 % as needed for rash.   sildenafil (VIAGRA) 100 MG  tablet Take 100 mg by mouth daily as needed for erectile dysfunction.   tamsulosin (FLOMAX) 0.4 MG CAPS capsule Take 0.4 mg by mouth daily.   Tiotropium Bromide Monohydrate (SPIRIVA RESPIMAT) 2.5 MCG/ACT AERS Inhale 2 puffs into the lungs daily.   Tiotropium Bromide Monohydrate (SPIRIVA RESPIMAT) 2.5 MCG/ACT AERS Inhale 2 puffs into the lungs daily.   [DISCONTINUED] atorvastatin (LIPITOR) 40 MG tablet TAKE ONE TABLET BY MOUTH AT BEDTIME FOR CHOLESTEROL     Allergies:   Patient has no known allergies.   Social History   Socioeconomic History   Marital status: Divorced    Spouse name: Not on file   Number of children: Not on file   Years of education: Not on file   Highest education  level: Not on file  Occupational History   Not on file  Tobacco Use   Smoking status: Former    Packs/day: 0.50    Years: 57.00    Pack years: 28.50    Types: Cigarettes   Smokeless tobacco: Never  Vaping Use   Vaping Use: Never used  Substance and Sexual Activity   Alcohol use: Not Currently    Alcohol/week: 10.0 standard drinks    Types: 10 Glasses of wine per week    Comment: 0-2 beers a day    Drug use: Not Currently    Types: Flunitrazepam   Sexual activity: Not on file  Other Topics Concern   Not on file  Social History Narrative   Not on file   Social Determinants of Health   Financial Resource Strain: Not on file  Food Insecurity: Not on file  Transportation Needs: Not on file  Physical Activity: Not on file  Stress: Not on file  Social Connections: Not on file    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  ROS:   Please see the history of present illness.     All other systems reviewed and are negative.  EKGs/Labs/Other Studies Reviewed:    The following studies were reviewed today:  EKG:   05/12/21: SR rate 82 Low voltage with baseline  artifact  Transthoracic Echocardiogram: Date: 03/20/21 Results:  1. Left ventricular ejection fraction, by estimation, is 60 to 65%. The  left ventricle has normal function. The left ventricle has no regional  wall motion abnormalities. Left ventricular diastolic parameters were  normal.   2. Right ventricular systolic function is low normal. The right  ventricular size is mildly enlarged.   3. The mitral valve is normal in structure. Trivial mitral valve  regurgitation.   4. The aortic valve is normal in structure. Aortic valve regurgitation is  not visualized.   NonCardiac CT: Date: 08/02/18 Results: Mild CAC and Aortic Athero IMPRESSION: 1. Resolution of previously noted right upper lobe lobar pulmonary embolus. No large central pulmonary embolus is identified. 2. Panlobular emphysema with new areas of subsegmental atelectasis and/or scarring in the right upper lobe and right middle lobe. 3. Respiratory motion and left arm streak artifact simulating pulmonary emboli to the right lower lobe are identified. No acute pulmonary embolus is currently noted.   Aortic Atherosclerosis (ICD10-I70.0) and Emphysema (ICD10-J43.9).  ECG or NM Stress Testing : Date: 10/17/18 Results: Nuclear stress EF: 71%. Normal perfusion with minimal soft tissue attenuation. No ischemia or scar This is a low risk study.     Recent Labs: 03/21/2021: BUN 30; Creatinine, Ser 1.90; Potassium 4.8; Sodium 138 04/24/2021: Hemoglobin 12.9; Platelets 264.0  Recent Lipid Panel No results found for: CHOL, TRIG, HDL, CHOLHDL, VLDL, LDLCALC, LDLDIRECT       Physical Exam:    VS:  BP 130/68   Pulse 88   Ht 6\' 1"  (1.854 m)   Wt 167 lb (75.8 kg)   SpO2 97%   BMI 22.03 kg/m     Wt Readings from Last 3 Encounters:  05/12/21 167 lb (75.8 kg)  04/13/21 168 lb 3.2 oz (76.3 kg)  03/19/21 165 lb (74.8 kg)    Gen: No distress, Thin male appears older that stated age   Neck: No JVD,  carotid bruit Ears:  Bilateral Frank Sign Cardiac: No Rubs or Gallops, no Murmur, normal  rate , +2 radial pulses; No RV heave Respiratory: Bilateral expiratory wheezes normal effort, normal  respiratory rate GI: Soft, nontender, non-distended  MS: No  edema;  moves all extremities Integument: Skin feels warm Neuro:  At time of evaluation, alert and oriented to person/place/time/situation  Psych: Normal affect, patient feels well   ASSESSMENT:    1. Precordial pain   2. Hypertension associated with diabetes ([place])   3. COPD with acute exacerbation (HCC)    PLAN:    Precordial Chest Pain HTN with DM COPD who continues to smoke on home O2, (one year of cessation) HX of PE and DVT on Eliquis IDA and hx of GI bleed  - Will do Cardiac CT, does not need high KEV; need diltiazem 90 mg prior to scan (hold norvasc prior to scan) given his significant COPD; last creatinine 1.5 04/07/21 and will need recheck (he had an AKI during his September admission, this continued to improve; he may need hydration protocol - removed pravastatin off list, increase atorvastatin to 80 mg PO daily, reviewed last CT - no change in eliquis  Will plan for three months  follow up unless new symptoms or abnormal test results warranting change in plan (Lipids with sooner follow up if positive CT     Medication Adjustments/Labs and Tests Ordered: Current medicines are reviewed at length with the patient today.  Concerns regarding medicines are outlined above.  Orders Placed This Encounter  Procedures   CT CORONARY MORPH W/CTA COR W/SCORE W/CA W/CM &/OR WO/CM   Basic metabolic panel    Meds ordered this encounter  Medications   diltiazem (CARDIZEM) 30 MG tablet    Sig: Take 2 hours prior to Cardiac CT    Dispense:  3 tablet    Refill:  0   atorvastatin (LIPITOR) 80 MG tablet    Sig: Take 1 tablet (80 mg total) by mouth daily.    Dispense:  90 tablet    Refill:  3     Patient Instructions  Medication Instructions:  Your  physician has recommended you make the following change in your medication:  INCREASE: atorvastatin (Lipitor) to 80 mg by mouth daily  *If you need a refill on your cardiac medications before your next appointment, please call your pharmacy*   Lab Work: TODAY: BMET  If you have labs (blood work) drawn today and your tests are completely normal, you will receive your results only by: [place] (if you have MyChart) OR A paper copy in the mail If you have any lab test that is abnormal or we need to change your treatment, we will call you to review the results.   Testing/Procedures: Your physician has requested that you have cardiac CT. Cardiac computed tomography (CT) is a painless test that uses an x-ray machine to take clear, detailed pictures of your heart. For further information please visit [email or web address].[email or web address]. Please follow instruction sheet as given.     Follow-Up: At [facility], you and your health needs are our priority.  As part of our continuing mission to provide you with exceptional heart care, we have created designated Provider Care Teams.  These Care Teams include your primary Cardiologist (physician) and Advanced Practice Providers (APPs -  Physician Assistants and Nurse Practitioners) who all work together to provide you with the care you need, when you need it.  We recommend signing up for the patient portal called "MyChart".  Sign up information is provided on this After Visit Summary.  MyChart is used  to connect with patients for Virtual Visits (Telemedicine).  Patients are able to view lab/test results, encounter notes, upcoming appointments, etc.  Non-urgent messages can be sent to your provider as well.   To learn more about what you can do with MyChart, go to [email or web address].    Your next appointment:   3 month(s)  The format for your next appointment:   In Person  Provider:   Werner Lean, MD     Other Instructions   Your  cardiac CT will be scheduled at one of the below locations:   [facility] [address] [place],  [address] [phone]   At [facility], please arrive at the [facility] main entrance (entrance A) of [facility] 30 minutes prior to test start time. You can use the FREE valet parking offered at the main entrance (encouraged to control the heart rate for the test) Proceed to the [facility] Radiology Department (first floor) to check-in and test prep.   Please follow these instructions carefully (unless otherwise directed):  Hold all erectile dysfunction medications (Viagra) at least 3 days (72 hrs) prior to test.  On the Night Before the Test: Be sure to Drink plenty of water. Do not consume any caffeinated/decaffeinated beverages or chocolate 12 hours prior to your test. Do not take any antihistamines 12 hours prior to your test. Xyzal and Singular   On the Day of the Test: Drink plenty of water until 1 hour prior to the test. Do not eat any food 4 hours prior to the test. You may take your regular medications prior to the test.  Take Diltiazem 90 mg two hours prior to test.        After the Test: Drink plenty of water. After receiving IV contrast, you may experience a mild flushed feeling. This is normal. On occasion, you may experience a mild rash up to 24 hours after the test. This is not dangerous. If this occurs, you can take Benadryl 25 mg and increase your fluid intake. If you experience trouble breathing, this can be serious. If it is severe call 911 IMMEDIATELY. If it is mild, please call our office. If you take any of these medications: Glipizide/Metformin, Avandament, Glucavance, please do not take 48 hours after completing test unless otherwise instructed.  Please allow 2-4 weeks for scheduling of routine cardiac CTs. Some insurance companies require a pre-authorization which may delay scheduling of this test.   For  non-scheduling related questions, please contact the cardiac imaging nurse navigator should you have any questions/concerns: Marchia Bond, Cardiac Imaging Nurse Navigator Gordy Clement, Cardiac Imaging Nurse Navigator [facility] Direct Office Dial: [phone]   For scheduling needs, including cancellations and rescheduling, please call [place], [phone].     Signed, Werner Lean, MD  05/12/2021 10:47 AM    [place]

## 2021-05-12 ENCOUNTER — Ambulatory Visit (INDEPENDENT_AMBULATORY_CARE_PROVIDER_SITE_OTHER): Payer: Medicare HMO | Admitting: Internal Medicine

## 2021-05-12 ENCOUNTER — Encounter: Payer: Self-pay | Admitting: Internal Medicine

## 2021-05-12 ENCOUNTER — Other Ambulatory Visit: Payer: Self-pay

## 2021-05-12 VITALS — BP 130/68 | HR 88 | Ht 73.0 in | Wt 167.0 lb

## 2021-05-12 DIAGNOSIS — R072 Precordial pain: Secondary | ICD-10-CM | POA: Diagnosis not present

## 2021-05-12 DIAGNOSIS — E1159 Type 2 diabetes mellitus with other circulatory complications: Secondary | ICD-10-CM

## 2021-05-12 DIAGNOSIS — J441 Chronic obstructive pulmonary disease with (acute) exacerbation: Secondary | ICD-10-CM

## 2021-05-12 DIAGNOSIS — I152 Hypertension secondary to endocrine disorders: Secondary | ICD-10-CM

## 2021-05-12 LAB — BASIC METABOLIC PANEL
BUN/Creatinine Ratio: 8 — ABNORMAL LOW (ref 10–24)
BUN: 13 mg/dL (ref 8–27)
CO2: 21 mmol/L (ref 20–29)
Calcium: 9.3 mg/dL (ref 8.6–10.2)
Chloride: 106 mmol/L (ref 96–106)
Creatinine, Ser: 1.7 mg/dL — ABNORMAL HIGH (ref 0.76–1.27)
Glucose: 93 mg/dL (ref 70–99)
Potassium: 4.7 mmol/L (ref 3.5–5.2)
Sodium: 140 mmol/L (ref 134–144)
eGFR: 43 mL/min/{1.73_m2} — ABNORMAL LOW (ref >59)

## 2021-05-12 MED ORDER — ATORVASTATIN CALCIUM 80 MG PO TABS: 80.0000 mg | ORAL_TABLET | Freq: Every day | ORAL | 3 refills | Status: DC

## 2021-05-12 MED ORDER — DILTIAZEM HCL 30 MG PO TABS: ORAL_TABLET | ORAL | 0 refills | Status: DC

## 2021-05-12 NOTE — Patient Instructions (Addendum)
Medication Instructions:  Your physician has recommended you make the following change in your medication:  INCREASE: atorvastatin (Lipitor) to 80 mg by mouth daily  *If you need a refill on your cardiac medications before your next appointment, please call your pharmacy*   Lab Work: TODAY: BMET  If you have labs (blood work) drawn today and your tests are completely normal, you will receive your results only by: [place] (if you have MyChart) OR A paper copy in the mail If you have any lab test that is abnormal or we need to change your treatment, we will call you to review the results.   Testing/Procedures: Your physician has requested that you have cardiac CT. Cardiac computed tomography (CT) is a painless test that uses an x-ray machine to take clear, detailed pictures of your heart. For further information please visit HugeFiesta.[email or web address]. Please follow instruction sheet as given.     Follow-Up: At [facility], you and your health needs are our priority.  As part of our continuing mission to provide you with exceptional heart care, we have created designated Provider Care Teams.  These Care Teams include your primary Cardiologist (physician) and Advanced Practice Providers (APPs -  Physician Assistants and Nurse Practitioners) who all work together to provide you with the care you need, when you need it.  We recommend signing up for the patient portal called "MyChart".  Sign up information is provided on this After Visit Summary.  MyChart is used to connect with patients for Virtual Visits (Telemedicine).  Patients are able to view lab/test results, encounter notes, upcoming appointments, etc.  Non-urgent messages can be sent to your provider as well.   To learn more about what you can do with MyChart, go to [email or web address].    Your next appointment:   3 month(s)  The format for your next appointment:   In Person  Provider:   Werner Lean, MD      Other Instructions   Your cardiac CT will be scheduled at one of the below locations:   [facility] [address] [place], [place] [address] [phone]   At [facility], please arrive at the [facility] main entrance (entrance A) of [facility] 30 minutes prior to test start time. You can use the FREE valet parking offered at the main entrance (encouraged to control the heart rate for the test) Proceed to the [facility] Radiology Department (first floor) to check-in and test prep.   Please follow these instructions carefully (unless otherwise directed):  Hold all erectile dysfunction medications (Viagra) at least 3 days (72 hrs) prior to test.  On the Night Before the Test: Be sure to Drink plenty of water. Do not consume any caffeinated/decaffeinated beverages or chocolate 12 hours prior to your test. Do not take any antihistamines 12 hours prior to your test. Xyzal and Singular   On the Day of the Test: Drink plenty of water until 1 hour prior to the test. Do not eat any food 4 hours prior to the test. You may take your regular medications prior to the test.  Take Diltiazem 90 mg two hours prior to test.        After the Test: Drink plenty of water. After receiving IV contrast, you may experience a mild flushed feeling. This is normal. On occasion, you may experience a mild rash up to 24 hours after the test. This is not dangerous. If this occurs, you can take Benadryl 25 mg  and increase your fluid intake. If you experience trouble breathing, this can be serious. If it is severe call 911 IMMEDIATELY. If it is mild, please call our office. If you take any of these medications: Glipizide/Metformin, Avandament, Glucavance, please do not take 48 hours after completing test unless otherwise instructed.  Please allow 2-4 weeks for scheduling of routine cardiac CTs. Some insurance companies require a pre-authorization which may delay  scheduling of this test.   For non-scheduling related questions, please contact the cardiac imaging nurse navigator should you have any questions/concerns: Marchia Bond, Cardiac Imaging Nurse Navigator Gordy Clement, Cardiac Imaging Nurse Navigator [place] [facility] Direct Office Dial: [phone]   For scheduling needs, including cancellations and rescheduling, please call [place], [phone].

## 2021-05-15 ENCOUNTER — Other Ambulatory Visit: Payer: Self-pay | Admitting: Internal Medicine

## 2021-05-15 NOTE — Addendum Note (Signed)
Addended by: Precious Gilding on: 05/15/2021 11:32 AM   Modules accepted: Orders

## 2021-05-15 NOTE — Telephone Encounter (Signed)
Called Patient in regard to creatinine increase from prior  Discussed pros and cons of Cardiac CT given what I suspect to be CKD Stage IIIB rather than and AKI on CKD picture Confirmed that while he dose have chest tightness, there hs been no change in this for the last year  Answered questions in regard to Cardiac CT vs Cath vs presumption of disease and medical therapy  Assessment Stable Chest Pain, COPD limiting use of BB, HTN with DM CKD Stage IIIb, Hx of PE on DOAC  Plan  - will cancel CT for Wednesday - will start PRN Nitro - if this is need for chest tightness in interim, will add IMDUR 30 mg PO daily and bring back sooner to discuss [place]  Patient had no further question and is amenable to plan  Rudean Haskell, MD [place]  [place] [place], [place] [address] [phone]  8:53 AM

## 2021-05-17 ENCOUNTER — Encounter (HOSPITAL_COMMUNITY): Payer: Self-pay

## 2021-05-17 ENCOUNTER — Emergency Department (HOSPITAL_COMMUNITY)
Admission: EM | Admit: 2021-05-17 | Discharge: 2021-05-17 | Disposition: A | Discharge status: Home / Self Care | Payer: No Typology Code available for payment source | Attending: Emergency Medicine | Admitting: Emergency Medicine

## 2021-05-17 ENCOUNTER — Emergency Department (HOSPITAL_COMMUNITY): Payer: No Typology Code available for payment source

## 2021-05-17 DIAGNOSIS — R059 Cough, unspecified: Secondary | ICD-10-CM | POA: Insufficient documentation

## 2021-05-17 DIAGNOSIS — R0789 Other chest pain: Secondary | ICD-10-CM | POA: Insufficient documentation

## 2021-05-17 DIAGNOSIS — R062 Wheezing: Secondary | ICD-10-CM | POA: Diagnosis not present

## 2021-05-17 DIAGNOSIS — R0602 Shortness of breath: Secondary | ICD-10-CM | POA: Insufficient documentation

## 2021-05-17 DIAGNOSIS — Z20822 Contact with and (suspected) exposure to covid-19: Secondary | ICD-10-CM | POA: Diagnosis not present

## 2021-05-17 DIAGNOSIS — Z5321 Procedure and treatment not carried out due to patient leaving prior to being seen by health care provider: Secondary | ICD-10-CM | POA: Diagnosis not present

## 2021-05-17 DIAGNOSIS — R079 Chest pain, unspecified: Secondary | ICD-10-CM | POA: Diagnosis not present

## 2021-05-17 LAB — BASIC METABOLIC PANEL
Anion gap: 9 (ref 5–15)
BUN: 16 mg/dL (ref 8–23)
CO2: 24 mmol/L (ref 22–32)
Calcium: 9.5 mg/dL (ref 8.9–10.3)
Chloride: 105 mmol/L (ref 98–111)
Creatinine, Ser: 1.64 mg/dL — ABNORMAL HIGH (ref 0.61–1.24)
GFR, Estimated: 45 mL/min — ABNORMAL LOW (ref >60)
Glucose, Bld: 97 mg/dL (ref 70–99)
Potassium: 4.6 mmol/L (ref 3.5–5.1)
Sodium: 138 mmol/L (ref 135–145)

## 2021-05-17 LAB — CBC
HCT: 42.1 % (ref 39.0–52.0)
Hemoglobin: 13.2 g/dL (ref 13.0–17.0)
MCH: 27.4 pg (ref 26.0–34.0)
MCHC: 31.4 g/dL (ref 30.0–36.0)
MCV: 87.5 fL (ref 80.0–100.0)
Platelets: 200 10*3/uL (ref 150–400)
RBC: 4.81 MIL/uL (ref 4.22–5.81)
RDW: 14.5 % (ref 11.5–15.5)
WBC: 6.4 10*3/uL (ref 4.0–10.5)
nRBC: 0 % (ref 0.0–0.2)

## 2021-05-17 LAB — RESP PANEL BY RT-PCR (FLU A&B, COVID) ARPGX2
Influenza A by PCR: NEGATIVE
Influenza B by PCR: NEGATIVE
SARS Coronavirus 2 by RT PCR: NEGATIVE

## 2021-05-17 LAB — TROPONIN I (HIGH SENSITIVITY): Troponin I (High Sensitivity): 3 ng/L (ref <18)

## 2021-05-17 MED ORDER — NITROGLYCERIN 0.4 MG SL SUBL: SUBLINGUAL_TABLET | SUBLINGUAL | 3 refills | Status: DC

## 2021-05-17 NOTE — Telephone Encounter (Signed)
Patient calling back. He states he is having chest pain and SOB and was waiting for EMS. He states he was supposed to be given nitroglycerin, but it was not sent to his pharmacy. EMS arrived and call ended.

## 2021-05-17 NOTE — ED Triage Notes (Signed)
Pt presents via EMS from home with c/o cough. Pt reports pain in his chest wall pain when coughing. Pt reports cough present for one week.

## 2021-05-17 NOTE — Telephone Encounter (Signed)
Called and left a message requesting pt call the office for medication change. I informed pt that I need to speak with him prior to ordering medication.  Pt needs to be aware not to take NTG and sildenafil within 24 hours of each other per MD.

## 2021-05-17 NOTE — ED Notes (Signed)
IV placed by EMS PTA. IV removed by staff as this pt wants to leave.

## 2021-05-17 NOTE — ED Provider Notes (Signed)
Emergency Medicine Provider Triage Evaluation Note  Gregory Christensen , a 70 y.o. male  was evaluated in triage.  Pt complains of chest pain, sob, cough, wheezing that started 5 days ago. Denies fever. Denies missed doses of eliquis  Review of Systems  Positive: Cough, chest pain, sob, wheezing Negative: fever  Physical Exam  There were no vitals taken for this visit. Gen:   Awake, no distress   Resp:  Normal effort  MSK:   Moves extremities without difficulty  Other:  Diffuse wheezing noted  Medical Decision Making  Medically screening exam initiated at 2:47 PM.  Appropriate orders placed.  Gregory Christensen was informed that the remainder of the evaluation will be completed by another provider, this initial triage assessment does not replace that evaluation, and the importance of remaining in the ED until their evaluation is complete.     Bishop [place] 05/17/21 1448    Valarie Merino, MD 05/20/21 1550

## 2021-05-17 NOTE — Addendum Note (Signed)
Addended by: Precious Gilding on: 05/17/2021 04:45 PM   Modules accepted: Orders

## 2021-05-17 NOTE — ED Notes (Signed)
Patient notified staff that he felt like he could not breathe due to temperature in the lobby. This Probation officer took pt's SpO2 and pt's sats are 100% on 2L [place]. RN made aware, staff will continue to monitor pt.

## 2021-05-18 ENCOUNTER — Telehealth: Payer: Self-pay | Admitting: Internal Medicine

## 2021-05-18 ENCOUNTER — Other Ambulatory Visit: Payer: Self-pay

## 2021-05-18 ENCOUNTER — Emergency Department (HOSPITAL_COMMUNITY): Payer: No Typology Code available for payment source

## 2021-05-18 ENCOUNTER — Emergency Department (HOSPITAL_COMMUNITY)
Admission: EM | Admit: 2021-05-18 | Discharge: 2021-05-18 | Disposition: A | Discharge status: Home / Self Care | Payer: No Typology Code available for payment source | Attending: Emergency Medicine | Admitting: Emergency Medicine

## 2021-05-18 ENCOUNTER — Encounter (HOSPITAL_COMMUNITY): Payer: Self-pay | Admitting: Emergency Medicine

## 2021-05-18 ENCOUNTER — Telehealth (HOSPITAL_COMMUNITY): Payer: Self-pay | Admitting: Emergency Medicine

## 2021-05-18 DIAGNOSIS — N1831 Chronic kidney disease, stage 3a: Secondary | ICD-10-CM | POA: Diagnosis not present

## 2021-05-18 DIAGNOSIS — Z20822 Contact with and (suspected) exposure to covid-19: Secondary | ICD-10-CM | POA: Insufficient documentation

## 2021-05-18 DIAGNOSIS — E119 Type 2 diabetes mellitus without complications: Secondary | ICD-10-CM | POA: Insufficient documentation

## 2021-05-18 DIAGNOSIS — I2510 Atherosclerotic heart disease of native coronary artery without angina pectoris: Secondary | ICD-10-CM | POA: Diagnosis not present

## 2021-05-18 DIAGNOSIS — Z87891 Personal history of nicotine dependence: Secondary | ICD-10-CM | POA: Diagnosis not present

## 2021-05-18 DIAGNOSIS — Z86718 Personal history of other venous thrombosis and embolism: Secondary | ICD-10-CM | POA: Diagnosis not present

## 2021-05-18 DIAGNOSIS — J441 Chronic obstructive pulmonary disease with (acute) exacerbation: Secondary | ICD-10-CM | POA: Insufficient documentation

## 2021-05-18 DIAGNOSIS — Z79899 Other long term (current) drug therapy: Secondary | ICD-10-CM | POA: Insufficient documentation

## 2021-05-18 DIAGNOSIS — Z85528 Personal history of other malignant neoplasm of kidney: Secondary | ICD-10-CM | POA: Diagnosis not present

## 2021-05-18 DIAGNOSIS — Z7901 Long term (current) use of anticoagulants: Secondary | ICD-10-CM | POA: Insufficient documentation

## 2021-05-18 DIAGNOSIS — Z85038 Personal history of other malignant neoplasm of large intestine: Secondary | ICD-10-CM | POA: Diagnosis not present

## 2021-05-18 DIAGNOSIS — R0602 Shortness of breath: Secondary | ICD-10-CM | POA: Diagnosis not present

## 2021-05-18 DIAGNOSIS — Z7951 Long term (current) use of inhaled steroids: Secondary | ICD-10-CM | POA: Diagnosis not present

## 2021-05-18 DIAGNOSIS — I129 Hypertensive chronic kidney disease with stage 1 through stage 4 chronic kidney disease, or unspecified chronic kidney disease: Secondary | ICD-10-CM | POA: Insufficient documentation

## 2021-05-18 DIAGNOSIS — R059 Cough, unspecified: Secondary | ICD-10-CM | POA: Diagnosis not present

## 2021-05-18 LAB — CBC WITH DIFFERENTIAL/PLATELET
Abs Immature Granulocytes: 0.01 10*3/uL (ref 0.00–0.07)
Basophils Absolute: 0 10*3/uL (ref 0.0–0.1)
Basophils Relative: 1 %
Eosinophils Absolute: 0.2 10*3/uL (ref 0.0–0.5)
Eosinophils Relative: 4 %
HCT: 41.1 % (ref 39.0–52.0)
Hemoglobin: 13 g/dL (ref 13.0–17.0)
Immature Granulocytes: 0 %
Lymphocytes Relative: 30 %
Lymphs Abs: 1.7 10*3/uL (ref 0.7–4.0)
MCH: 27.9 pg (ref 26.0–34.0)
MCHC: 31.6 g/dL (ref 30.0–36.0)
MCV: 88.2 fL (ref 80.0–100.0)
Monocytes Absolute: 0.5 10*3/uL (ref 0.1–1.0)
Monocytes Relative: 9 %
Neutro Abs: 3.1 10*3/uL (ref 1.7–7.7)
Neutrophils Relative %: 56 %
Platelets: 197 10*3/uL (ref 150–400)
RBC: 4.66 MIL/uL (ref 4.22–5.81)
RDW: 14.6 % (ref 11.5–15.5)
WBC: 5.6 10*3/uL (ref 4.0–10.5)
nRBC: 0 % (ref 0.0–0.2)

## 2021-05-18 LAB — RESP PANEL BY RT-PCR (FLU A&B, COVID) ARPGX2
Influenza A by PCR: NEGATIVE
Influenza B by PCR: NEGATIVE
SARS Coronavirus 2 by RT PCR: NEGATIVE

## 2021-05-18 LAB — COMPREHENSIVE METABOLIC PANEL
ALT: 25 U/L (ref 0–44)
AST: 33 U/L (ref 15–41)
Albumin: 4 g/dL (ref 3.5–5.0)
Alkaline Phosphatase: 90 U/L (ref 38–126)
Anion gap: 7 (ref 5–15)
BUN: 15 mg/dL (ref 8–23)
CO2: 23 mmol/L (ref 22–32)
Calcium: 9.2 mg/dL (ref 8.9–10.3)
Chloride: 105 mmol/L (ref 98–111)
Creatinine, Ser: 1.56 mg/dL — ABNORMAL HIGH (ref 0.61–1.24)
GFR, Estimated: 48 mL/min — ABNORMAL LOW (ref >60)
Glucose, Bld: 92 mg/dL (ref 70–99)
Potassium: 4.1 mmol/L (ref 3.5–5.1)
Sodium: 135 mmol/L (ref 135–145)
Total Bilirubin: 0.6 mg/dL (ref 0.3–1.2)
Total Protein: 8.2 g/dL — ABNORMAL HIGH (ref 6.5–8.1)

## 2021-05-18 LAB — TROPONIN I (HIGH SENSITIVITY): Troponin I (High Sensitivity): 4 ng/L (ref <18)

## 2021-05-18 MED ORDER — ALBUTEROL (5 MG/ML) CONTINUOUS INHALATION SOLN
10.0000 mg/h | INHALATION_SOLUTION | RESPIRATORY_TRACT | Status: DC
  Administered 2021-05-18: 08:00:00 10 mg/h via RESPIRATORY_TRACT

## 2021-05-18 MED ORDER — METHYLPREDNISOLONE SODIUM SUCC 125 MG IJ SOLR
125.0000 mg | Freq: Once | INTRAMUSCULAR | Status: AC
  Administered 2021-05-18: 08:00:00 125 mg via INTRAVENOUS
  Filled 2021-05-18: qty 2

## 2021-05-18 MED ORDER — PREDNISONE 10 MG (21) PO TBPK: ORAL_TABLET | Freq: Every day | ORAL | 0 refills | Status: DC

## 2021-05-18 MED ORDER — ALBUTEROL SULFATE (2.5 MG/3ML) 0.083% IN NEBU
INHALATION_SOLUTION | RESPIRATORY_TRACT | Status: AC
  Filled 2021-05-18: qty 12

## 2021-05-18 NOTE — Telephone Encounter (Signed)
Please see previous encounter

## 2021-05-18 NOTE — ED Triage Notes (Addendum)
Patient came by [facility] complaining of sob, and cough. Patient was seen at this faculty yesterday.

## 2021-05-18 NOTE — ED Provider Notes (Signed)
[facility] DEPT Provider Note   CSN: [id number] Arrival date & time: 05/18/21  0600     History Chief Complaint  Patient presents with   Shortness of Breath    Gregory Christensen is a 70 y.o. male.  Pt presents to the ED today with sob.  Pt came to the ED for the same yesterday, but left without being seen because the wait was too long.  Pt said he has COPD and this feels similar.  He had a negative CXR and covid/flu yesterday.  Pt is on Eliquis for DVT and has been compliant.      Past Medical History:  Diagnosis Date   Acute deep vein thrombosis (DVT) of left lower extremity (HCC)    Acute hypoxemic respiratory failure (HCC)    Anemia    Iron deficiency   Anxiety    Aortic atherosclerosis (HCC)    Arthritis    Benign prostatic hyperplasia 08/28/2018   UNSPECIFIED WHETHER LOWER URINARY TRACT SYMPTOMS PRESENT   CAD (coronary artery disease)    Cancer (HCC)    Chronic back pain    Chronic hip pain    CKD stage 3a 03/19/2021   Constipation    due to Iron M-W-F   COPD exacerbation ([place]) 10/18/2017   Dyspnea    Emphysema lung ([place])    Essential hypertension    GERD (gastroesophageal reflux disease)    Gout    Gross hematuria 09/08/2018   Head injury, acute, with loss of consciousness ([place])    unsure how long he was unresponsive   History of blood transfusion 05/2019   History of kidney stones    History of tobacco abuse    Hypoxia    Insomnia    Oxygen dependent    2l- 24/7   Pneumonia    in 1980's   Pre-diabetes    Pulmonary embolism ([place]) 05/31/2018   Renal mass    CONCERNING FOR RENAL CELL CARCINOMA DR. Lovena Neighbours   Respiratory distress    Respiratory failure, acute-on-chronic ([place]) 08/03/2018   Seasonal allergies    Sleep apnea     Patient Active Problem List   Diagnosis Date Noted   Precordial pain 05/12/2021   COPD with acute exacerbation ([place]) 03/19/2021   Benign prostatic hyperplasia with lower urinary tract symptoms  03/19/2021   Elevated prostate specific antigen (PSA) 03/19/2021   Type 2 diabetes mellitus ([place]) 03/19/2021   Obstructive sleep apnea 03/19/2021   Hyperlipidemia 03/19/2021   Allergic rhinitis 03/19/2021   CKD stage 3a 03/19/2021   Grade I diastolic dysfunction 41/28/7867   COPD (chronic obstructive pulmonary disease) ([place]) 03/19/2021   Benign neoplasm of transverse colon    Benign neoplasm of descending colon    Benign neoplasm of rectum    Heme positive stool    Iron deficiency anemia due to chronic blood loss    Melena    Abdominal pain, chronic, right lower quadrant    Chronic anticoagulation    Symptomatic anemia 09/24/2019   Pneumonia due to severe acute respiratory syndrome coronavirus 2 (SARS-CoV-2) 02/28/2019   Renal mass 10/31/2018   Chest pressure 10/15/2018   Coronary artery calcification 10/15/2018   Respiratory failure, acute-on-chronic ([place]) 08/03/2018   Acute deep vein thrombosis (DVT) of left lower extremity (HCC)    Acute hypoxemic respiratory failure ([place])    Pulmonary embolism ([place]) 05/31/2018   COPD exacerbation ([place]) 10/18/2017   Hypoxia    Respiratory distress    Hypertension associated with diabetes ([place])  Past Surgical History:  Procedure Laterality Date   COLONOSCOPY WITH PROPOFOL N/A 09/27/2019   Procedure: COLONOSCOPY WITH PROPOFOL;  Surgeon: Irene Shipper, MD;  Location: [facility];  Service: Endoscopy;  Laterality: N/A;   CYSTOSCOPY/URETEROSCOPY/HOLMIUM LASER/STENT PLACEMENT Left 10/31/2018   Procedure: CYSTOSCOPY/URETEROSCOPY;  Surgeon: Ceasar Mons, MD;  Location: [facility];  Service: Urology;  Laterality: Left;   ESOPHAGOGASTRODUODENOSCOPY (EGD) WITH PROPOFOL N/A 09/27/2019   Procedure: ESOPHAGOGASTRODUODENOSCOPY (EGD) WITH PROPOFOL;  Surgeon: Irene Shipper, MD;  Location: [facility];  Service: Endoscopy;  Laterality: N/A;   HERNIA REPAIR Left 1977   inguinal   HYDROCELE EXCISION Left 05/13/2019   Procedure: HYDROCELECTOMY  ADULT;  Surgeon: Ceasar Mons, MD;  Location: [facility];  Service: Urology;  Laterality: Left;   [place]   EXTENSIVE   POLYPECTOMY  09/27/2019   Procedure: POLYPECTOMY;  Surgeon: Irene Shipper, MD;  Location: [facility];  Service: Endoscopy;;   ROBOT ASSITED LAPAROSCOPIC NEPHROURETERECTOMY Left 10/31/2018   Procedure: XI ROBOT ASSITED LAPAROSCOPIC NEPHROURETERECTOMY;  Surgeon: Ceasar Mons, MD;  Location: [facility];  Service: Urology;  Laterality: Left;  ONLY NEEDS 240 MIN FOR ALL PROCEDURES   SHOULDER ARTHROSCOPY WITH ROTATOR CUFF REPAIR Left 04/12/2020   Procedure: LEFT SHOULDER ARTHROSCOPY, DEBRIDEMENT, BICEPS TENODESIS, MINI OPEN ROTATOR CUFF TEAR REPAIR;  Surgeon: Meredith Pel, MD;  Location: [facility];  Service: Orthopedics;  Laterality: Left;       Family History  Problem Relation Age of Onset   Diabetes Mother    Hypertension Sister    Hypertension Brother     Social History   Tobacco Use   Smoking status: Former    Packs/day: 0.50    Years: 57.00    Pack years: 28.50    Types: Cigarettes   Smokeless tobacco: Never  Vaping Use   Vaping Use: Never used  Substance Use Topics   Alcohol use: Not Currently    Alcohol/week: 10.0 standard drinks    Types: 10 Glasses of wine per week    Comment: 0-2 beers a day    Drug use: Not Currently    Types: Flunitrazepam    Home Medications Prior to Admission medications   Medication Sig Start Date End Date Taking? Authorizing Provider  predniSONE (STERAPRED UNI-PAK 21 TAB) 10 MG (21) TBPK tablet Take by mouth daily. Take 6 tabs by mouth daily  for 2 days, then 5 tabs for 2 days, then 4 tabs for 2 days, then 3 tabs for 2 days, 2 tabs for 2 days, then 1 tab by mouth daily for 2 days 05/18/21  Yes Isla Pence, MD  albuterol (PROVENTIL) (2.5 MG/3ML) 0.083% nebulizer solution Take 3 mLs (2.5 mg total) by nebulization every 6 (six) hours as needed for wheezing or shortness of breath. 08/04/18    Reyne Dumas, MD  albuterol (VENTOLIN HFA) 108 (90 Base) MCG/ACT inhaler Inhale 2 puffs into the lungs every 2 (two) hours as needed for wheezing or shortness of breath. 04/13/21   Freddi Starr, MD  amLODipine (NORVASC) 2.5 MG tablet Take 2.5 mg by mouth daily.    [provider]  apixaban (ELIQUIS) 5 MG TABS tablet Take 1 tablet (5 mg total) by mouth 2 (two) times daily. 06/10/18   Regalado, Belkys A, MD  ascorbic acid (VITAMIN C) 500 MG tablet in the morning and at bedtime. 05/10/21   [provider]  atorvastatin (LIPITOR) 80 MG tablet Take 1 tablet (80 mg total) by mouth daily. 05/12/21  Chandrasekhar, Mahesh A, MD  chlorhexidine (HIBICLENS) 4 % external liquid See admin instructions. 03/28/21   [provider]  Dextran 70-Hypromellose (ARTIFICIAL TEARS) 0.1-0.3 % SOLN Place 1 drop into both eyes daily as needed (dry eyes).    [provider]  diclofenac Sodium (VOLTAREN) 1 % GEL Apply 2 g topically 2 (two) times daily as needed (Pain).    [provider]  diltiazem (CARDIZEM) 30 MG tablet Take 2 hours prior to Cardiac CT 05/12/21   Rudean Haskell A, MD  docusate sodium (COLACE) 100 MG capsule Take 100 mg by mouth daily as needed for mild constipation.    [provider]  fluticasone (FLONASE) 50 MCG/ACT nasal spray Place into both nostrils as needed for allergies or rhinitis.    [provider]  fluticasone-salmeterol (WIXELA INHUB) 250-50 MCG/ACT AEPB Inhale 1 puff into the lungs in the morning and at bedtime.    [provider]  gabapentin (NEURONTIN) 300 MG capsule Take 300-600 mg by mouth See admin instructions. Takes 1 capsule in the morning and 2 capsule at night    [provider]  levocetirizine (XYZAL) 5 MG tablet Take 5 mg by mouth daily.    [provider]  lidocaine (LIDODERM) 5 % Place 1 patch onto the skin every 12 (twelve) hours as needed (pain).    [provider]   methenamine (HIPREX) 1 g tablet in the morning and at bedtime. 05/10/21   [provider]  montelukast (SINGULAIR) 10 MG tablet Take 10 mg by mouth at bedtime.    [provider]  mupirocin ointment (BACTROBAN) 2 % as needed for rash. 04/18/21   [provider]  nitroGLYCERIN (NITROSTAT) 0.4 MG SL tablet Take up to 3 tablets as needed for chest pain. 05/17/21   Werner Lean, MD  sildenafil (VIAGRA) 100 MG tablet Take 100 mg by mouth daily as needed for erectile dysfunction. 01/25/20   [provider]  tamsulosin (FLOMAX) 0.4 MG CAPS capsule Take 0.4 mg by mouth daily.    [provider]  Tiotropium Bromide Monohydrate (SPIRIVA RESPIMAT) 2.5 MCG/ACT AERS Inhale 2 puffs into the lungs daily.    [provider]  Tiotropium Bromide Monohydrate (SPIRIVA RESPIMAT) 2.5 MCG/ACT AERS Inhale 2 puffs into the lungs daily.    [provider]    Allergies    Patient has no known allergies.  Review of Systems   Review of Systems  Respiratory:  Positive for cough and shortness of breath.   All other systems reviewed and are negative.  Physical Exam Updated Vital Signs BP (!) 99/53   Pulse 72   Temp 97.9 F (36.6 C) (Oral)   Resp 16   Ht 6\' 1"  (1.854 m)   Wt 75.8 kg   SpO2 98%   BMI 22.03 kg/m   Physical Exam Vitals and nursing note reviewed.  Constitutional:      Appearance: He is well-developed.  HENT:     Head: Normocephalic and atraumatic.     Mouth/Throat:     Mouth: Mucous membranes are moist.  Eyes:     Pupils: Pupils are equal, round, and reactive to light.  Cardiovascular:     Rate and Rhythm: Normal rate and regular rhythm.  Pulmonary:     Effort: Tachypnea present.     Breath sounds: Wheezing present.  Abdominal:     General: Bowel sounds are normal.     Palpations: Abdomen is soft.  Musculoskeletal:  General: Normal range of motion.     Cervical back: Normal range of motion and neck supple.   Skin:    General: Skin is warm.     Capillary Refill: Capillary refill takes less than 2 seconds.  Neurological:     General: No focal deficit present.     Mental Status: He is alert and oriented to person, place, and time.  Psychiatric:        Mood and Affect: Mood normal.        Behavior: Behavior normal.    ED Results / Procedures / Treatments   Labs (all labs ordered are listed, but only abnormal results are displayed) Labs Reviewed  COMPREHENSIVE METABOLIC PANEL - Abnormal; Notable for the following components:      Result Value   Creatinine, Ser 1.56 (*)    Total Protein 8.2 (*)    GFR, Estimated 48 (*)    All other components within normal limits  RESP PANEL BY RT-PCR (FLU A&B, COVID) ARPGX2  CBC WITH DIFFERENTIAL/PLATELET  TROPONIN I (HIGH SENSITIVITY)  TROPONIN I (HIGH SENSITIVITY)    EKG EKG Interpretation  Date/Time:  Thursday May 18 2021 06:25:12 EST Ventricular Rate:  70 PR Interval:  172 QRS Duration: 127 QT Interval:  421 QTC Calculation: 455 R Axis:   57 Text Interpretation: Sinus rhythm Nonspecific intraventricular conduction delay Borderline T abnormalities, anterior leads Confirmed by Veryl Speak [phone]) on 05/18/2021 6:51:22 AM  Radiology DG Chest 2 View  Result Date: 05/18/2021 CLINICAL DATA:  Shortness of breath. EXAM: CHEST - 2 VIEW COMPARISON:  05/17/2021 FINDINGS: Lungs are hyperexpanded. Peripheral scarring noted right upper lobe, similar to prior and also comparing back to 05/07/2020. The lungs are clear without focal pneumonia, edema, pneumothorax or pleural effusion. The cardiopericardial silhouette is within normal limits for size. The visualized bony structures of the thorax show no acute abnormality. Telemetry leads overlie the chest. IMPRESSION: Hyperexpansion without acute cardiopulmonary findings. Electronically Signed   By: Misty Stanley M.D.   On: 05/18/2021 07:17   DG Chest Port 1 View  Result Date: 05/17/2021 CLINICAL  DATA:  Chest pain, cough EXAM: PORTABLE CHEST 1 VIEW COMPARISON:  03/18/2021 FINDINGS: Cardiac and mediastinal contours are within normal limits. No focal pulmonary opacity. No pleural effusion or pneumothorax. No acute osseous abnormality. IMPRESSION: No acute cardiopulmonary process. Electronically Signed   By: Merilyn Baba M.D.   On: 05/17/2021 15:02    Procedures Procedures   Medications Ordered in ED Medications  albuterol (PROVENTIL,VENTOLIN) solution continuous neb (10 mg/hr Nebulization New Bag/Given 05/18/21 0732)  albuterol (PROVENTIL) (2.5 MG/3ML) 0.083% nebulizer solution (  Not Given 05/18/21 0733)  methylPREDNISolone sodium succinate (SOLU-MEDROL) 125 mg/2 mL injection 125 mg (125 mg Intravenous Given 05/18/21 3220)    ED Course  I have reviewed the triage vital signs and the nursing notes.  Pertinent labs & imaging results that were available during my care of the patient were reviewed by me and considered in my medical decision making (see chart for details).    MDM Rules/Calculators/A&P                           Pt is feeling much better.  He is able to ambulate with his O2 sats staying 97%.  He is stable for d/c.  He is to return if worse.  Covid/flu neg.  Cxr neg.  Allante Blea was evaluated in Emergency Department on 05/18/2021 for the symptoms described in the  history of present illness. He was evaluated in the context of the global COVID-19 pandemic, which necessitated consideration that the patient might be at risk for infection with the SARS-CoV-2 virus that causes COVID-19. Institutional protocols and algorithms that pertain to the evaluation of patients at risk for COVID-19 are in a state of rapid change based on information released by regulatory bodies including the CDC and federal and state organizations. These policies and algorithms were followed during the patient's care in the ED.  Final Clinical Impression(s) / ED Diagnoses Final diagnoses:  COPD  exacerbation ([place])    Rx / DC Orders ED Discharge Orders          Ordered    predniSONE (STERAPRED UNI-PAK 21 TAB) 10 MG (21) TBPK tablet  Daily        05/18/21 1105             Isla Pence, MD 05/18/21 1108

## 2021-05-18 NOTE — ED Notes (Signed)
Patient was able to ambulate while walking in place. O2 Stats was 97 and with my observation the patient felt like winding with his breathing but was in good spirit.

## 2021-05-18 NOTE — Telephone Encounter (Signed)
Gregory Christensen is returning [facility].

## 2021-05-18 NOTE — Telephone Encounter (Signed)
Called pt and reviewed medication interaction with NTG and Viagra.  I advised pt that he has to wait 24 hours between taking NTG and Viagra.  Pt made aware that could cause significant hypotension.  Pt verbalizes understanding no questions asked.

## 2021-05-19 DIAGNOSIS — N1832 Chronic kidney disease, stage 3b: Secondary | ICD-10-CM | POA: Diagnosis not present

## 2021-05-19 DIAGNOSIS — I10 Essential (primary) hypertension: Secondary | ICD-10-CM | POA: Diagnosis not present

## 2021-05-19 DIAGNOSIS — N400 Enlarged prostate without lower urinary tract symptoms: Secondary | ICD-10-CM | POA: Diagnosis not present

## 2021-05-19 DIAGNOSIS — E7800 Pure hypercholesterolemia, unspecified: Secondary | ICD-10-CM | POA: Diagnosis not present

## 2021-05-19 DIAGNOSIS — I2510 Atherosclerotic heart disease of native coronary artery without angina pectoris: Secondary | ICD-10-CM | POA: Diagnosis not present

## 2021-05-19 DIAGNOSIS — K219 Gastro-esophageal reflux disease without esophagitis: Secondary | ICD-10-CM | POA: Diagnosis not present

## 2021-05-19 DIAGNOSIS — E785 Hyperlipidemia, unspecified: Secondary | ICD-10-CM | POA: Diagnosis not present

## 2021-05-19 DIAGNOSIS — J449 Chronic obstructive pulmonary disease, unspecified: Secondary | ICD-10-CM | POA: Diagnosis not present

## 2021-05-19 NOTE — ED Notes (Signed)
PT called stating he never received his prescription, appears to be sent to [facility] in [place], told as such. 03/19/21 at 1042.

## 2021-05-23 DIAGNOSIS — R0902 Hypoxemia: Secondary | ICD-10-CM | POA: Diagnosis not present

## 2021-05-23 DIAGNOSIS — J449 Chronic obstructive pulmonary disease, unspecified: Secondary | ICD-10-CM | POA: Diagnosis not present

## 2021-05-23 DIAGNOSIS — R634 Abnormal weight loss: Secondary | ICD-10-CM | POA: Diagnosis not present

## 2021-05-24 ENCOUNTER — Ambulatory Visit (HOSPITAL_COMMUNITY): Payer: Medicare HMO

## 2021-06-17 ENCOUNTER — Emergency Department (HOSPITAL_COMMUNITY): Payer: No Typology Code available for payment source

## 2021-06-17 ENCOUNTER — Inpatient Hospital Stay (HOSPITAL_COMMUNITY)
Admission: EM | Admit: 2021-06-17 | Discharge: 2021-06-21 | DRG: 378 | Disposition: A | Discharge status: Home / Self Care | Payer: No Typology Code available for payment source | Attending: Internal Medicine | Admitting: Internal Medicine

## 2021-06-17 ENCOUNTER — Encounter (HOSPITAL_COMMUNITY): Payer: Self-pay

## 2021-06-17 DIAGNOSIS — Z9981 Dependence on supplemental oxygen: Secondary | ICD-10-CM

## 2021-06-17 DIAGNOSIS — R0789 Other chest pain: Secondary | ICD-10-CM | POA: Diagnosis not present

## 2021-06-17 DIAGNOSIS — Z86711 Personal history of pulmonary embolism: Secondary | ICD-10-CM

## 2021-06-17 DIAGNOSIS — Z79899 Other long term (current) drug therapy: Secondary | ICD-10-CM

## 2021-06-17 DIAGNOSIS — Z8616 Personal history of COVID-19: Secondary | ICD-10-CM

## 2021-06-17 DIAGNOSIS — K219 Gastro-esophageal reflux disease without esophagitis: Secondary | ICD-10-CM | POA: Diagnosis present

## 2021-06-17 DIAGNOSIS — Z8249 Family history of ischemic heart disease and other diseases of the circulatory system: Secondary | ICD-10-CM

## 2021-06-17 DIAGNOSIS — K921 Melena: Secondary | ICD-10-CM | POA: Diagnosis not present

## 2021-06-17 DIAGNOSIS — Z87891 Personal history of nicotine dependence: Secondary | ICD-10-CM

## 2021-06-17 DIAGNOSIS — J439 Emphysema, unspecified: Secondary | ICD-10-CM | POA: Diagnosis not present

## 2021-06-17 DIAGNOSIS — N1832 Chronic kidney disease, stage 3b: Secondary | ICD-10-CM | POA: Diagnosis present

## 2021-06-17 DIAGNOSIS — Z86718 Personal history of other venous thrombosis and embolism: Secondary | ICD-10-CM

## 2021-06-17 DIAGNOSIS — I129 Hypertensive chronic kidney disease with stage 1 through stage 4 chronic kidney disease, or unspecified chronic kidney disease: Secondary | ICD-10-CM | POA: Diagnosis present

## 2021-06-17 DIAGNOSIS — K2970 Gastritis, unspecified, without bleeding: Secondary | ICD-10-CM | POA: Diagnosis present

## 2021-06-17 DIAGNOSIS — N400 Enlarged prostate without lower urinary tract symptoms: Secondary | ICD-10-CM | POA: Diagnosis present

## 2021-06-17 DIAGNOSIS — I10 Essential (primary) hypertension: Secondary | ICD-10-CM

## 2021-06-17 DIAGNOSIS — G4489 Other headache syndrome: Secondary | ICD-10-CM | POA: Diagnosis not present

## 2021-06-17 DIAGNOSIS — K922 Gastrointestinal hemorrhage, unspecified: Secondary | ICD-10-CM

## 2021-06-17 DIAGNOSIS — D649 Anemia, unspecified: Secondary | ICD-10-CM | POA: Diagnosis present

## 2021-06-17 DIAGNOSIS — J449 Chronic obstructive pulmonary disease, unspecified: Secondary | ICD-10-CM | POA: Diagnosis present

## 2021-06-17 DIAGNOSIS — D62 Acute posthemorrhagic anemia: Secondary | ICD-10-CM | POA: Diagnosis present

## 2021-06-17 DIAGNOSIS — I2510 Atherosclerotic heart disease of native coronary artery without angina pectoris: Secondary | ICD-10-CM | POA: Diagnosis present

## 2021-06-17 DIAGNOSIS — R079 Chest pain, unspecified: Secondary | ICD-10-CM | POA: Diagnosis not present

## 2021-06-17 DIAGNOSIS — N183 Chronic kidney disease, stage 3 unspecified: Secondary | ICD-10-CM | POA: Diagnosis present

## 2021-06-17 DIAGNOSIS — Z833 Family history of diabetes mellitus: Secondary | ICD-10-CM

## 2021-06-17 DIAGNOSIS — Z7901 Long term (current) use of anticoagulants: Secondary | ICD-10-CM

## 2021-06-17 DIAGNOSIS — R42 Dizziness and giddiness: Secondary | ICD-10-CM | POA: Diagnosis not present

## 2021-06-17 DIAGNOSIS — R195 Other fecal abnormalities: Secondary | ICD-10-CM | POA: Diagnosis present

## 2021-06-17 DIAGNOSIS — R0902 Hypoxemia: Secondary | ICD-10-CM | POA: Diagnosis not present

## 2021-06-17 DIAGNOSIS — Z7951 Long term (current) use of inhaled steroids: Secondary | ICD-10-CM

## 2021-06-17 DIAGNOSIS — E785 Hyperlipidemia, unspecified: Secondary | ICD-10-CM | POA: Diagnosis present

## 2021-06-17 LAB — CBC
HCT: 20.8 % — ABNORMAL LOW (ref 39.0–52.0)
Hemoglobin: 6.1 g/dL — CL (ref 13.0–17.0)
MCH: 26.2 pg (ref 26.0–34.0)
MCHC: 29.3 g/dL — ABNORMAL LOW (ref 30.0–36.0)
MCV: 89.3 fL (ref 80.0–100.0)
Platelets: 306 10*3/uL (ref 150–400)
RBC: 2.33 MIL/uL — ABNORMAL LOW (ref 4.22–5.81)
RDW: 14.6 % (ref 11.5–15.5)
WBC: 5.9 10*3/uL (ref 4.0–10.5)
nRBC: 0.3 % — ABNORMAL HIGH (ref 0.0–0.2)

## 2021-06-17 NOTE — ED Triage Notes (Signed)
Pt comes via [facility] for CP, central that started about an hour ago, with numbness in the L arm, PTA took 2 nitro and 324 ASA with relief of pain. Pt wears 2L all the time

## 2021-06-17 NOTE — ED Provider Notes (Signed)
Emergency Medicine Provider Triage Evaluation Note  Gregory Christensen , a 70 y.o. male  was evaluated in triage.  Pt complains of chest pain  Review of Systems  Positive: Relief with nitroglycerin Negative: No fever   Physical Exam  BP 104/61    Pulse 78    Temp 97.6 F (36.4 C) (Oral)    Resp (!) 22    SpO2 98%  Gen:   Awake, no distress   Resp:  Normal effort  MSK:   Moves extremities without difficulty  Other:    Medical Decision Making  Medically screening exam initiated at 11:13 PM.  Appropriate orders placed.  Gregory Christensen was informed that the remainder of the evaluation will be completed by another provider, this initial triage assessment does not replace that evaluation, and the importance of remaining in the ED until their evaluation is complete.     Gregory Meadow, PA-C 06/17/21 [place], [place], DO 06/17/21 2344

## 2021-06-18 ENCOUNTER — Other Ambulatory Visit: Payer: Self-pay

## 2021-06-18 ENCOUNTER — Encounter (HOSPITAL_COMMUNITY): Payer: Self-pay | Admitting: Family Medicine

## 2021-06-18 DIAGNOSIS — Z87891 Personal history of nicotine dependence: Secondary | ICD-10-CM | POA: Diagnosis not present

## 2021-06-18 DIAGNOSIS — N1832 Chronic kidney disease, stage 3b: Secondary | ICD-10-CM | POA: Diagnosis not present

## 2021-06-18 DIAGNOSIS — Z86718 Personal history of other venous thrombosis and embolism: Secondary | ICD-10-CM | POA: Diagnosis not present

## 2021-06-18 DIAGNOSIS — Z7901 Long term (current) use of anticoagulants: Secondary | ICD-10-CM | POA: Diagnosis not present

## 2021-06-18 DIAGNOSIS — Z833 Family history of diabetes mellitus: Secondary | ICD-10-CM | POA: Diagnosis not present

## 2021-06-18 DIAGNOSIS — Z8249 Family history of ischemic heart disease and other diseases of the circulatory system: Secondary | ICD-10-CM | POA: Diagnosis not present

## 2021-06-18 DIAGNOSIS — Z86711 Personal history of pulmonary embolism: Secondary | ICD-10-CM | POA: Diagnosis not present

## 2021-06-18 DIAGNOSIS — I2510 Atherosclerotic heart disease of native coronary artery without angina pectoris: Secondary | ICD-10-CM | POA: Diagnosis not present

## 2021-06-18 DIAGNOSIS — D649 Anemia, unspecified: Secondary | ICD-10-CM

## 2021-06-18 DIAGNOSIS — K219 Gastro-esophageal reflux disease without esophagitis: Secondary | ICD-10-CM | POA: Diagnosis not present

## 2021-06-18 DIAGNOSIS — Z9981 Dependence on supplemental oxygen: Secondary | ICD-10-CM | POA: Diagnosis not present

## 2021-06-18 DIAGNOSIS — J439 Emphysema, unspecified: Secondary | ICD-10-CM | POA: Diagnosis not present

## 2021-06-18 DIAGNOSIS — K5520 Angiodysplasia of colon without hemorrhage: Secondary | ICD-10-CM | POA: Diagnosis not present

## 2021-06-18 DIAGNOSIS — K31A0 Gastric intestinal metaplasia, unspecified: Secondary | ICD-10-CM | POA: Diagnosis not present

## 2021-06-18 DIAGNOSIS — E785 Hyperlipidemia, unspecified: Secondary | ICD-10-CM | POA: Diagnosis not present

## 2021-06-18 DIAGNOSIS — N400 Enlarged prostate without lower urinary tract symptoms: Secondary | ICD-10-CM | POA: Diagnosis present

## 2021-06-18 DIAGNOSIS — K2950 Unspecified chronic gastritis without bleeding: Secondary | ICD-10-CM | POA: Diagnosis not present

## 2021-06-18 DIAGNOSIS — K921 Melena: Secondary | ICD-10-CM | POA: Diagnosis not present

## 2021-06-18 DIAGNOSIS — Z7951 Long term (current) use of inhaled steroids: Secondary | ICD-10-CM | POA: Diagnosis not present

## 2021-06-18 DIAGNOSIS — I10 Essential (primary) hypertension: Secondary | ICD-10-CM

## 2021-06-18 DIAGNOSIS — D62 Acute posthemorrhagic anemia: Secondary | ICD-10-CM | POA: Diagnosis not present

## 2021-06-18 DIAGNOSIS — I129 Hypertensive chronic kidney disease with stage 1 through stage 4 chronic kidney disease, or unspecified chronic kidney disease: Secondary | ICD-10-CM | POA: Diagnosis not present

## 2021-06-18 DIAGNOSIS — Z79899 Other long term (current) drug therapy: Secondary | ICD-10-CM | POA: Diagnosis not present

## 2021-06-18 DIAGNOSIS — R0789 Other chest pain: Secondary | ICD-10-CM | POA: Diagnosis present

## 2021-06-18 DIAGNOSIS — K2970 Gastritis, unspecified, without bleeding: Secondary | ICD-10-CM | POA: Diagnosis not present

## 2021-06-18 DIAGNOSIS — K922 Gastrointestinal hemorrhage, unspecified: Secondary | ICD-10-CM

## 2021-06-18 DIAGNOSIS — Z8616 Personal history of COVID-19: Secondary | ICD-10-CM | POA: Diagnosis not present

## 2021-06-18 LAB — CBC
HCT: 19.2 % — ABNORMAL LOW (ref 39.0–52.0)
Hemoglobin: 5.7 g/dL — CL (ref 13.0–17.0)
MCH: 26.5 pg (ref 26.0–34.0)
MCHC: 29.7 g/dL — ABNORMAL LOW (ref 30.0–36.0)
MCV: 89.3 fL (ref 80.0–100.0)
Platelets: 277 10*3/uL (ref 150–400)
RBC: 2.15 MIL/uL — ABNORMAL LOW (ref 4.22–5.81)
RDW: 14.6 % (ref 11.5–15.5)
WBC: 5.1 10*3/uL (ref 4.0–10.5)
nRBC: 0 % (ref 0.0–0.2)

## 2021-06-18 LAB — BASIC METABOLIC PANEL
Anion gap: 6 (ref 5–15)
BUN: 17 mg/dL (ref 8–23)
CO2: 20 mmol/L — ABNORMAL LOW (ref 22–32)
Calcium: 8.7 mg/dL — ABNORMAL LOW (ref 8.9–10.3)
Chloride: 111 mmol/L (ref 98–111)
Creatinine, Ser: 1.63 mg/dL — ABNORMAL HIGH (ref 0.61–1.24)
GFR, Estimated: 45 mL/min — ABNORMAL LOW (ref >60)
Glucose, Bld: 94 mg/dL (ref 70–99)
Potassium: 4 mmol/L (ref 3.5–5.1)
Sodium: 137 mmol/L (ref 135–145)

## 2021-06-18 LAB — COMPREHENSIVE METABOLIC PANEL
ALT: 18 U/L (ref 0–44)
AST: 21 U/L (ref 15–41)
Albumin: 3.5 g/dL (ref 3.5–5.0)
Alkaline Phosphatase: 65 U/L (ref 38–126)
Anion gap: 9 (ref 5–15)
BUN: 17 mg/dL (ref 8–23)
CO2: 17 mmol/L — ABNORMAL LOW (ref 22–32)
Calcium: 8.7 mg/dL — ABNORMAL LOW (ref 8.9–10.3)
Chloride: 111 mmol/L (ref 98–111)
Creatinine, Ser: 1.65 mg/dL — ABNORMAL HIGH (ref 0.61–1.24)
GFR, Estimated: 44 mL/min — ABNORMAL LOW (ref >60)
Glucose, Bld: 106 mg/dL — ABNORMAL HIGH (ref 70–99)
Potassium: 4 mmol/L (ref 3.5–5.1)
Sodium: 137 mmol/L (ref 135–145)
Total Bilirubin: 0.5 mg/dL (ref 0.3–1.2)
Total Protein: 6.7 g/dL (ref 6.5–8.1)

## 2021-06-18 LAB — POC OCCULT BLOOD, ED: Fecal Occult Bld: POSITIVE — AB

## 2021-06-18 LAB — RESP PANEL BY RT-PCR (FLU A&B, COVID) ARPGX2
Influenza A by PCR: NEGATIVE
Influenza B by PCR: NEGATIVE
SARS Coronavirus 2 by RT PCR: NEGATIVE

## 2021-06-18 LAB — TROPONIN I (HIGH SENSITIVITY)
Troponin I (High Sensitivity): 4 ng/L (ref <18)
Troponin I (High Sensitivity): 4 ng/L (ref <18)

## 2021-06-18 LAB — HEMOGLOBIN AND HEMATOCRIT, BLOOD
HCT: 20.1 % — ABNORMAL LOW (ref 39.0–52.0)
HCT: 25.5 % — ABNORMAL LOW (ref 39.0–52.0)
Hemoglobin: 5.8 g/dL — CL (ref 13.0–17.0)
Hemoglobin: 8.3 g/dL — ABNORMAL LOW (ref 13.0–17.0)

## 2021-06-18 LAB — PREPARE RBC (CROSSMATCH)

## 2021-06-18 MED ORDER — SODIUM CHLORIDE 0.9 % IV SOLN: 10.0000 mL/h | Freq: Once | INTRAVENOUS | Status: DC

## 2021-06-18 MED ORDER — FLUTICASONE FUROATE-VILANTEROL 200-25 MCG/ACT IN AEPB
1.0000 | INHALATION_SPRAY | Freq: Every day | RESPIRATORY_TRACT | Status: DC
  Administered 2021-06-18 – 2021-06-21 (×4): 1 via RESPIRATORY_TRACT
  Filled 2021-06-18: qty 28

## 2021-06-18 MED ORDER — LACTATED RINGERS IV SOLN: INTRAVENOUS | Status: DC

## 2021-06-18 MED ORDER — ALBUTEROL SULFATE (2.5 MG/3ML) 0.083% IN NEBU: 2.5000 mg | INHALATION_SOLUTION | RESPIRATORY_TRACT | Status: DC | PRN

## 2021-06-18 MED ORDER — ACETAMINOPHEN 325 MG PO TABS
650.0000 mg | ORAL_TABLET | Freq: Once | ORAL | Status: AC
  Administered 2021-06-18: 07:00:00 650 mg via ORAL
  Filled 2021-06-18: qty 2

## 2021-06-18 MED ORDER — ONDANSETRON HCL 4 MG PO TABS: 4.0000 mg | ORAL_TABLET | Freq: Four times a day (QID) | ORAL | Status: DC | PRN

## 2021-06-18 MED ORDER — PANTOPRAZOLE SODIUM 40 MG IV SOLR
40.0000 mg | Freq: Once | INTRAVENOUS | Status: AC
  Administered 2021-06-18: 01:00:00 40 mg via INTRAVENOUS
  Filled 2021-06-18: qty 40

## 2021-06-18 MED ORDER — FUROSEMIDE 10 MG/ML IJ SOLN
20.0000 mg | Freq: Once | INTRAMUSCULAR | Status: AC
  Administered 2021-06-18: 07:00:00 20 mg via INTRAVENOUS
  Filled 2021-06-18: qty 2

## 2021-06-18 MED ORDER — ACETAMINOPHEN 325 MG PO TABS
650.0000 mg | ORAL_TABLET | Freq: Four times a day (QID) | ORAL | Status: DC | PRN
  Administered 2021-06-19 – 2021-06-21 (×3): 650 mg via ORAL
  Filled 2021-06-18 (×3): qty 2

## 2021-06-18 MED ORDER — ACETAMINOPHEN 650 MG RE SUPP: 650.0000 mg | Freq: Four times a day (QID) | RECTAL | Status: DC | PRN

## 2021-06-18 MED ORDER — TAMSULOSIN HCL 0.4 MG PO CAPS
0.4000 mg | ORAL_CAPSULE | Freq: Every day | ORAL | Status: DC
  Administered 2021-06-18 – 2021-06-21 (×3): 0.4 mg via ORAL
  Filled 2021-06-18 (×3): qty 1

## 2021-06-18 MED ORDER — ONDANSETRON HCL 4 MG/2ML IJ SOLN: 4.0000 mg | Freq: Four times a day (QID) | INTRAMUSCULAR | Status: DC | PRN

## 2021-06-18 MED ORDER — PANTOPRAZOLE SODIUM 40 MG IV SOLR
40.0000 mg | Freq: Two times a day (BID) | INTRAVENOUS | Status: DC
  Administered 2021-06-21: 14:00:00 40 mg via INTRAVENOUS
  Filled 2021-06-18: qty 40

## 2021-06-18 MED ORDER — ALBUTEROL SULFATE HFA 108 (90 BASE) MCG/ACT IN AERS: 2.0000 | INHALATION_SPRAY | RESPIRATORY_TRACT | Status: DC | PRN

## 2021-06-18 MED ORDER — PANTOPRAZOLE INFUSION (NEW) - SIMPLE MED
8.0000 mg/h | INTRAVENOUS | Status: AC
  Administered 2021-06-18 – 2021-06-20 (×7): 8 mg/h via INTRAVENOUS
  Filled 2021-06-18 (×2): qty 80
  Filled 2021-06-18: qty 100
  Filled 2021-06-18 (×4): qty 80

## 2021-06-18 MED ORDER — LIP MEDEX EX OINT
TOPICAL_OINTMENT | CUTANEOUS | Status: DC | PRN
  Filled 2021-06-18: qty 7

## 2021-06-18 MED ORDER — FLUTICASONE PROPIONATE 50 MCG/ACT NA SUSP
1.0000 | Freq: Every day | NASAL | Status: DC
Start: 2021-06-18 — End: 2021-06-21
  Administered 2021-06-18 – 2021-06-21 (×3): 1 via NASAL
  Filled 2021-06-18: qty 16

## 2021-06-18 MED ORDER — DIPHENHYDRAMINE HCL 25 MG PO CAPS
25.0000 mg | ORAL_CAPSULE | Freq: Once | ORAL | Status: AC
  Administered 2021-06-18: 07:00:00 25 mg via ORAL
  Filled 2021-06-18: qty 1

## 2021-06-18 MED ORDER — UMECLIDINIUM BROMIDE 62.5 MCG/ACT IN AEPB
1.0000 | INHALATION_SPRAY | Freq: Every day | RESPIRATORY_TRACT | Status: DC
  Administered 2021-06-18 – 2021-06-21 (×4): 1 via RESPIRATORY_TRACT
  Filled 2021-06-18: qty 7

## 2021-06-18 MED ORDER — SODIUM CHLORIDE 0.9% IV SOLUTION: Freq: Once | INTRAVENOUS | Status: DC

## 2021-06-18 NOTE — Plan of Care (Signed)

## 2021-06-18 NOTE — Consult Note (Signed)
Referring Provider: [place] Primary Care Physician:  Vernie Shanks, MD Primary Gastroenterologist: Sadie [facility] PCP  Reason for Consultation: Symptomatic anemia with hemoglobin of 5.9  HPI: Gregory Christensen is a 70 y.o. male with past medical history of coronary artery disease, chronic kidney disease, COPD on 2 L oxygen, history of PE on Eliquis presented to the hospital with fatigue and weakness.  Was also having melena.  Upon initial evaluation was found to have hemoglobin of 5.9.  Eliquis on hold and GI is consulted for further evaluation.   Patient seen and examined at bedside.  He is complaining of black-colored stool for last 2 weeks as well as worsening reflux.  Denies any trouble swallowing, nausea or vomiting.  Denies any abdominal pain.  Denies diarrhea or constipation.  Denies bright blood per rectum.    EGD in March 2021 for evaluation of anemia by Dr. Henrene Pastor was normal.  Colonoscopy showed 12 mm tubular adenoma in the rectum as well as few more adenomatous polyp scattered throughout the colon and diverticulosis .  Repeat colonoscopy was recommended in 3 years.  Past Medical History:  Diagnosis Date   Acute deep vein thrombosis (DVT) of left lower extremity (HCC)    Acute hypoxemic respiratory failure (HCC)    Anemia    Iron deficiency   Anxiety    Aortic atherosclerosis (HCC)    Arthritis    Benign prostatic hyperplasia 08/28/2018   UNSPECIFIED WHETHER LOWER URINARY TRACT SYMPTOMS PRESENT   CAD (coronary artery disease)    Cancer (HCC)    Chronic back pain    Chronic hip pain    CKD stage 3a 03/19/2021   Constipation    due to Iron M-W-F   COPD exacerbation ([place]) 10/18/2017   Dyspnea    Emphysema lung ([place])    Essential hypertension    GERD (gastroesophageal reflux disease)    Gout    Gross hematuria 09/08/2018   Head injury, acute, with loss of consciousness ([place])    unsure how long he was unresponsive   History of blood transfusion 05/2019   History of kidney stones     History of tobacco abuse    Hypoxia    Insomnia    Oxygen dependent    2l- 24/7   Pneumonia    in 1980's   Pre-diabetes    Pulmonary embolism ([place]) 05/31/2018   Renal mass    CONCERNING FOR RENAL CELL CARCINOMA DR. Lovena Neighbours   Respiratory distress    Respiratory failure, acute-on-chronic ([place]) 08/03/2018   Seasonal allergies    Sleep apnea     Past Surgical History:  Procedure Laterality Date   COLONOSCOPY WITH PROPOFOL N/A 09/27/2019   Procedure: COLONOSCOPY WITH PROPOFOL;  Surgeon: Irene Shipper, MD;  Location: [facility];  Service: Endoscopy;  Laterality: N/A;   CYSTOSCOPY/URETEROSCOPY/HOLMIUM LASER/STENT PLACEMENT Left 10/31/2018   Procedure: CYSTOSCOPY/URETEROSCOPY;  Surgeon: Ceasar Mons, MD;  Location: [facility];  Service: Urology;  Laterality: Left;   ESOPHAGOGASTRODUODENOSCOPY (EGD) WITH PROPOFOL N/A 09/27/2019   Procedure: ESOPHAGOGASTRODUODENOSCOPY (EGD) WITH PROPOFOL;  Surgeon: Irene Shipper, MD;  Location: [facility];  Service: Endoscopy;  Laterality: N/A;   HERNIA REPAIR Left 1977   inguinal   HYDROCELE EXCISION Left 05/13/2019   Procedure: HYDROCELECTOMY ADULT;  Surgeon: Ceasar Mons, MD;  Location: [facility];  Service: Urology;  Laterality: Left;   [place]   EXTENSIVE   POLYPECTOMY  09/27/2019   Procedure: POLYPECTOMY;  Surgeon: Irene Shipper, MD;  Location: [facility];  Service: Endoscopy;;   ROBOT ASSITED LAPAROSCOPIC NEPHROURETERECTOMY Left 10/31/2018   Procedure: XI ROBOT ASSITED LAPAROSCOPIC NEPHROURETERECTOMY;  Surgeon: Ceasar Mons, MD;  Location: [facility];  Service: Urology;  Laterality: Left;  ONLY NEEDS 240 MIN FOR ALL PROCEDURES   SHOULDER ARTHROSCOPY WITH ROTATOR CUFF REPAIR Left 04/12/2020   Procedure: LEFT SHOULDER ARTHROSCOPY, DEBRIDEMENT, BICEPS TENODESIS, MINI OPEN ROTATOR CUFF TEAR REPAIR;  Surgeon: Meredith Pel, MD;  Location: [facility];  Service: Orthopedics;  Laterality: Left;    Prior to Admission  medications   Medication Sig Start Date End Date Taking? Authorizing Provider  albuterol (PROVENTIL) (2.5 MG/3ML) 0.083% nebulizer solution Take 3 mLs (2.5 mg total) by nebulization every 6 (six) hours as needed for wheezing or shortness of breath. 08/04/18  Yes Reyne Dumas, MD  albuterol (VENTOLIN HFA) 108 (90 Base) MCG/ACT inhaler Inhale 2 puffs into the lungs every 2 (two) hours as needed for wheezing or shortness of breath. 04/13/21  Yes Freddi Starr, MD  amLODipine (NORVASC) 2.5 MG tablet Take 2.5 mg by mouth daily.   Yes [provider]  apixaban (ELIQUIS) 5 MG TABS tablet Take 1 tablet (5 mg total) by mouth 2 (two) times daily. 06/10/18  Yes Regalado, Belkys A, MD  ascorbic acid (VITAMIN C) 500 MG tablet in the morning and at bedtime. 05/10/21  Yes [provider]  atorvastatin (LIPITOR) 80 MG tablet Take 1 tablet (80 mg total) by mouth daily. 05/12/21  Yes Chandrasekhar, Mahesh A, MD  chlorhexidine (HIBICLENS) 4 % external liquid See admin instructions. 03/28/21  Yes [provider]  Dextran 70-Hypromellose (ARTIFICIAL TEARS) 0.1-0.3 % SOLN Place 1 drop into both eyes daily as needed (dry eyes).   Yes [provider]  diclofenac Sodium (VOLTAREN) 1 % GEL Apply 2 g topically 2 (two) times daily as needed (Pain).   Yes [provider]  docusate sodium (COLACE) 100 MG capsule Take 100 mg by mouth daily as needed for mild constipation.   Yes [provider]  fluticasone (FLONASE) 50 MCG/ACT nasal spray Place into both nostrils as needed for allergies or rhinitis.   Yes [provider]  fluticasone-salmeterol (ADVAIR) 250-50 MCG/ACT AEPB Inhale 1 puff into the lungs in the morning and at bedtime.   Yes [provider]  gabapentin (NEURONTIN) 300 MG capsule Take 300 mg by mouth 3 (three) times daily.   Yes [provider]  levocetirizine (XYZAL) 5 MG tablet Take 5 mg by mouth daily.   Yes [provider]   lidocaine (LIDODERM) 5 % Place 1 patch onto the skin every 12 (twelve) hours as needed (pain).   Yes [provider]  methenamine (HIPREX) 1 g tablet in the morning and at bedtime. 05/10/21  Yes [provider]  montelukast (SINGULAIR) 10 MG tablet Take 10 mg by mouth at bedtime.   Yes [provider]  nitroGLYCERIN (NITROSTAT) 0.4 MG SL tablet Take up to 3 tablets as needed for chest pain. 05/17/21  Yes Chandrasekhar, Mahesh A, MD  predniSONE (STERAPRED UNI-PAK 21 TAB) 10 MG (21) TBPK tablet Take by mouth daily. Take 6 tabs by mouth daily  for 2 days, then 5 tabs for 2 days, then 4 tabs for 2 days, then 3 tabs for 2 days, 2 tabs for 2 days, then 1 tab by mouth daily for 2 days Patient not taking: Reported on 06/18/2021 05/18/21   Isla Pence, MD  tamsulosin (FLOMAX) 0.4 MG CAPS capsule Take 0.4 mg by mouth  daily.   Yes [provider]  Tiotropium Bromide Monohydrate (SPIRIVA RESPIMAT) 2.5 MCG/ACT AERS Inhale 2 puffs into the lungs daily.   Yes [provider]  diltiazem (CARDIZEM) 30 MG tablet Take 2 hours prior to Cardiac CT Patient not taking: Reported on 06/18/2021 05/12/21   Werner Lean, MD  mupirocin ointment (BACTROBAN) 2 % as needed for rash. Patient not taking: Reported on 06/18/2021 04/18/21   [provider]  sildenafil (VIAGRA) 100 MG tablet Take 100 mg by mouth daily as needed for erectile dysfunction. 01/25/20   [provider]  Tiotropium Bromide Monohydrate (SPIRIVA RESPIMAT) 2.5 MCG/ACT AERS Inhale 2 puffs into the lungs daily. Patient not taking: Reported on 06/18/2021    [provider]    Scheduled Meds:  sodium chloride   Intravenous Once   fluticasone furoate-vilanterol  1 puff Inhalation Daily   [START ON 06/21/2021] pantoprazole  40 mg Intravenous Q12H   tamsulosin  0.4 mg Oral Daily   umeclidinium bromide  1 puff Inhalation Daily   Continuous Infusions:  sodium chloride Stopped  (06/18/21 0346)   lactated ringers 75 mL/hr at 06/18/21 0646   pantoprazole 8 mg/hr (06/18/21 0348)   PRN Meds:.acetaminophen **OR** acetaminophen, albuterol, ondansetron **OR** ondansetron (ZOFRAN) IV  Allergies as of 06/17/2021   (No Known Allergies)    Family History  Problem Relation Age of Onset   Diabetes Mother    Hypertension Sister    Hypertension Brother     Social History   Socioeconomic History   Marital status: Divorced    Spouse name: Not on file   Number of children: Not on file   Years of education: Not on file   Highest education level: Not on file  Occupational History   Not on file  Tobacco Use   Smoking status: Former    Packs/day: 0.50    Years: 57.00    Pack years: 28.50    Types: Cigarettes   Smokeless tobacco: Never  Vaping Use   Vaping Use: Never used  Substance and Sexual Activity   Alcohol use: Not Currently    Alcohol/week: 10.0 standard drinks    Types: 10 Glasses of wine per week    Comment: 0-2 beers a day    Drug use: Not Currently    Types: Flunitrazepam   Sexual activity: Not on file  Other Topics Concern   Not on file  Social History Narrative   Not on file   Social Determinants of Health   Financial Resource Strain: Not on file  Food Insecurity: Not on file  Transportation Needs: Not on file  Physical Activity: Not on file  Stress: Not on file  Social Connections: Not on file  Intimate Partner Violence: Not on file    Review of Systems: All negative except as stated above in HPI.  Physical Exam: Vital signs: Vitals:   06/18/21 0932 06/18/21 0953  BP: 115/67 101/72  Pulse: 73 72  Resp: 18 18  Temp: 97.8 F (36.6 C) (!) 97.2 F (36.2 C)  SpO2: 100% 100%     General:   Alert,  Well-developed, well-nourished, pleasant and cooperative in NAD, oxygen by nasal cannula Lungs: Anterior exam only, decreased breath sounds bilaterally, Heart:  Regular rate and rhythm; no murmurs, clicks, rubs,  or gallops. Abdomen:  Soft, nontender, nondistended, bowel sounds present. Neuro : Alert and oriented x3 Psych : Mood and affect normal Rectal:  Deferred  GI:  Lab Results: Recent Labs    06/17/21  2318 06/18/21 0038 06/18/21 0700  WBC 5.9  --  5.1  HGB 6.1* 5.8* 5.7*  HCT 20.8* 20.1* 19.2*  PLT 306  --  277   BMET Recent Labs    06/17/21 2318 06/18/21 0700  NA 137 137  K 4.0 4.0  CL 111 111  CO2 17* 20*  GLUCOSE 106* 94  BUN 17 17  CREATININE 1.65* 1.63*  CALCIUM 8.7* 8.7*   LFT Recent Labs    06/17/21 2318  PROT 6.7  ALBUMIN 3.5  AST 21  ALT 18  ALKPHOS 65  BILITOT 0.5   PT/INR No results for input(s): LABPROT, INR in the last 72 hours.   Studies/Results: DG Chest Portable 1 View  Result Date: 06/17/2021 CLINICAL DATA:  Chest pain. EXAM: PORTABLE CHEST 1 VIEW COMPARISON:  PA Lat 05/18/2021. FINDINGS: The lungs emphysematous with scattered linear scarring but no evidence of focal pneumonia. No pleural effusion is seen. Heart size and vasculature are normal except for calcifications in the aortic arch. Osteopenia and slight thoracic levoscoliosis. IMPRESSION: No evidence of acute chest disease.  Stable COPD chest. Electronically Signed   By: Telford Nab M.D.   On: 06/17/2021 23:51    Impression/Plan: -Symptomatic anemia with melena.  Hemoglobin of 5.9 on admission.  EGD in March 2021 was normal. -COPD oxygen supplement -Pulmonary embolism.  Was on Eliquis.  Last dose yesterday morning.  Recommendations --------------------------- -Okay to have full liquid diet today -Patient is currently getting is secondary to blood transfusion. -Plan for EGD tomorrow if hemoglobin more than 7 -Continue Protonix -N.p.o. past midnight  Risks (bleeding, infection, bowel perforation that could require surgery, sedation-related changes in cardiopulmonary systems), benefits (identification and possible treatment of source of symptoms, exclusion of certain causes of symptoms), and alternatives  (watchful waiting, radiographic imaging studies, empiric medical treatment)  were explained to patient/family in detail and patient wishes to proceed.     LOS: 0 days   Otis Brace  MD, FACP 06/18/2021, 12:13 PM  Contact #  [phone]

## 2021-06-18 NOTE — ED Notes (Signed)
6E called for purple man assignment.

## 2021-06-18 NOTE — ED Notes (Signed)
This RN spoke with blood bank technician, Melissa per order to transfuse RBC. Melissa stated that pt's anticipated unit of blood is not currently ready. Plan for this RN to receive a call from blood bank when blood is ready to be picked up.

## 2021-06-18 NOTE — ED Provider Notes (Signed)
[facility] EMERGENCY DEPARTMENT Provider Note   CSN: [id number] Arrival date & time: 06/17/21  2256     History Chief Complaint  Patient presents with   Chest Pain    Gregory Christensen is a 70 y.o. male.  HPI     This is a 70 year old male with a history of PE on Eliquis, coronary artery disease, chronic kidney disease, COPD on oxygen who presents with chest pain.  Patient reports several week history of waxing and waning chest discomfort.  Reports associated shortness of breath.  He had onset of chest pain tonight approximately 1 hour prior to arrival.  Has some numbness.  Took 2 nitro and an aspirin with relief.  Currently he is pain-free.  No recent fevers or cough.  Patient reports a history of reflux for which he takes an antacid.  He also chronically takes Pepto-Bismol.  He has not noted any bloody stools or hematemesis.  He has noted dark stools.  Chart reviewed.  Similar presentation in 08/2019.  Was admitted to the hospital for symptomatic anemia.  Upper endoscopy was clean.  Lower endoscopy with polyp and removal.  No active bleeding noted at that time.  His anemia was presumptively GI related.  Past Medical History:  Diagnosis Date   Acute deep vein thrombosis (DVT) of left lower extremity (HCC)    Acute hypoxemic respiratory failure (HCC)    Anemia    Iron deficiency   Anxiety    Aortic atherosclerosis (HCC)    Arthritis    Benign prostatic hyperplasia 08/28/2018   UNSPECIFIED WHETHER LOWER URINARY TRACT SYMPTOMS PRESENT   CAD (coronary artery disease)    Cancer (HCC)    Chronic back pain    Chronic hip pain    CKD stage 3a 03/19/2021   Constipation    due to Iron M-W-F   COPD exacerbation ([place]) 10/18/2017   Dyspnea    Emphysema lung ([place])    Essential hypertension    GERD (gastroesophageal reflux disease)    Gout    Gross hematuria 09/08/2018   Head injury, acute, with loss of consciousness ([place])    unsure how long he was unresponsive    History of blood transfusion 05/2019   History of kidney stones    History of tobacco abuse    Hypoxia    Insomnia    Oxygen dependent    2l- 24/7   Pneumonia    in 1980's   Pre-diabetes    Pulmonary embolism ([place]) 05/31/2018   Renal mass    CONCERNING FOR RENAL CELL CARCINOMA DR. Lovena Neighbours   Respiratory distress    Respiratory failure, acute-on-chronic ([place]) 08/03/2018   Seasonal allergies    Sleep apnea     Patient Active Problem List   Diagnosis Date Noted   Precordial pain 05/12/2021   COPD with acute exacerbation ([place]) 03/19/2021   Benign prostatic hyperplasia with lower urinary tract symptoms 03/19/2021   Elevated prostate specific antigen (PSA) 03/19/2021   Type 2 diabetes mellitus ([place]) 03/19/2021   Obstructive sleep apnea 03/19/2021   Hyperlipidemia 03/19/2021   Allergic rhinitis 03/19/2021   CKD stage 3a 03/19/2021   Grade I diastolic dysfunction 54/27/0623   COPD (chronic obstructive pulmonary disease) ([place]) 03/19/2021   Benign neoplasm of transverse colon    Benign neoplasm of descending colon    Benign neoplasm of rectum    Heme positive stool    Iron deficiency anemia due to chronic blood loss    Melena  Abdominal pain, chronic, right lower quadrant    Chronic anticoagulation    Symptomatic anemia 09/24/2019   Pneumonia due to severe acute respiratory syndrome coronavirus 2 (SARS-CoV-2) 02/28/2019   Renal mass 10/31/2018   Chest pressure 10/15/2018   Coronary artery calcification 10/15/2018   Respiratory failure, acute-on-chronic ([place]) 08/03/2018   Acute deep vein thrombosis (DVT) of left lower extremity (HCC)    Acute hypoxemic respiratory failure ([place])    Pulmonary embolism ([place]) 05/31/2018   COPD exacerbation ([place]) 10/18/2017   Hypoxia    Respiratory distress    Hypertension associated with diabetes [facility])     Past Surgical History:  Procedure Laterality Date   COLONOSCOPY WITH PROPOFOL N/A 09/27/2019   Procedure: COLONOSCOPY WITH PROPOFOL;   Surgeon: Irene Shipper, MD;  Location: [facility];  Service: Endoscopy;  Laterality: N/A;   CYSTOSCOPY/URETEROSCOPY/HOLMIUM LASER/STENT PLACEMENT Left 10/31/2018   Procedure: CYSTOSCOPY/URETEROSCOPY;  Surgeon: Ceasar Mons, MD;  Location: [facility];  Service: Urology;  Laterality: Left;   ESOPHAGOGASTRODUODENOSCOPY (EGD) WITH PROPOFOL N/A 09/27/2019   Procedure: ESOPHAGOGASTRODUODENOSCOPY (EGD) WITH PROPOFOL;  Surgeon: Irene Shipper, MD;  Location: [facility];  Service: Endoscopy;  Laterality: N/A;   HERNIA REPAIR Left 1977   inguinal   HYDROCELE EXCISION Left 05/13/2019   Procedure: HYDROCELECTOMY ADULT;  Surgeon: Ceasar Mons, MD;  Location: [facility];  Service: Urology;  Laterality: Left;   [place]   EXTENSIVE   POLYPECTOMY  09/27/2019   Procedure: POLYPECTOMY;  Surgeon: Irene Shipper, MD;  Location: [facility];  Service: Endoscopy;;   ROBOT ASSITED LAPAROSCOPIC NEPHROURETERECTOMY Left 10/31/2018   Procedure: XI ROBOT ASSITED LAPAROSCOPIC NEPHROURETERECTOMY;  Surgeon: Ceasar Mons, MD;  Location: [facility];  Service: Urology;  Laterality: Left;  ONLY NEEDS 240 MIN FOR ALL PROCEDURES   SHOULDER ARTHROSCOPY WITH ROTATOR CUFF REPAIR Left 04/12/2020   Procedure: LEFT SHOULDER ARTHROSCOPY, DEBRIDEMENT, BICEPS TENODESIS, MINI OPEN ROTATOR CUFF TEAR REPAIR;  Surgeon: Meredith Pel, MD;  Location: [facility];  Service: Orthopedics;  Laterality: Left;       Family History  Problem Relation Age of Onset   Diabetes Mother    Hypertension Sister    Hypertension Brother     Social History   Tobacco Use   Smoking status: Former    Packs/day: 0.50    Years: 57.00    Pack years: 28.50    Types: Cigarettes   Smokeless tobacco: Never  Vaping Use   Vaping Use: Never used  Substance Use Topics   Alcohol use: Not Currently    Alcohol/week: 10.0 standard drinks    Types: 10 Glasses of wine per week    Comment: 0-2 beers a day    Drug use: Not  Currently    Types: Flunitrazepam    Home Medications Prior to Admission medications   Medication Sig Start Date End Date Taking? Authorizing Provider  predniSONE (STERAPRED UNI-PAK 21 TAB) 10 MG (21) TBPK tablet Take by mouth daily. Take 6 tabs by mouth daily  for 2 days, then 5 tabs for 2 days, then 4 tabs for 2 days, then 3 tabs for 2 days, 2 tabs for 2 days, then 1 tab by mouth daily for 2 days 05/18/21   Isla Pence, MD  albuterol (PROVENTIL) (2.5 MG/3ML) 0.083% nebulizer solution Take 3 mLs (2.5 mg total) by nebulization every 6 (six) hours as needed for wheezing or shortness of breath. 08/04/18   Reyne Dumas, MD  albuterol (VENTOLIN HFA) 108 (90 Base) MCG/ACT  inhaler Inhale 2 puffs into the lungs every 2 (two) hours as needed for wheezing or shortness of breath. 04/13/21   Freddi Starr, MD  amLODipine (NORVASC) 2.5 MG tablet Take 2.5 mg by mouth daily.    [provider]  apixaban (ELIQUIS) 5 MG TABS tablet Take 1 tablet (5 mg total) by mouth 2 (two) times daily. 06/10/18   Regalado, Belkys A, MD  ascorbic acid (VITAMIN C) 500 MG tablet in the morning and at bedtime. 05/10/21   [provider]  atorvastatin (LIPITOR) 80 MG tablet Take 1 tablet (80 mg total) by mouth daily. 05/12/21   Chandrasekhar, Lyda Kalata A, MD  chlorhexidine (HIBICLENS) 4 % external liquid See admin instructions. 03/28/21   [provider]  Dextran 70-Hypromellose (ARTIFICIAL TEARS) 0.1-0.3 % SOLN Place 1 drop into both eyes daily as needed (dry eyes).    [provider]  diclofenac Sodium (VOLTAREN) 1 % GEL Apply 2 g topically 2 (two) times daily as needed (Pain).    [provider]  diltiazem (CARDIZEM) 30 MG tablet Take 2 hours prior to Cardiac CT 05/12/21   Rudean Haskell A, MD  docusate sodium (COLACE) 100 MG capsule Take 100 mg by mouth daily as needed for mild constipation.    [provider]  fluticasone (FLONASE) 50 MCG/ACT nasal spray Place into  both nostrils as needed for allergies or rhinitis.    [provider]  fluticasone-salmeterol (WIXELA INHUB) 250-50 MCG/ACT AEPB Inhale 1 puff into the lungs in the morning and at bedtime.    [provider]  gabapentin (NEURONTIN) 300 MG capsule Take 300-600 mg by mouth See admin instructions. Takes 1 capsule in the morning and 2 capsule at night    [provider]  levocetirizine (XYZAL) 5 MG tablet Take 5 mg by mouth daily.    [provider]  lidocaine (LIDODERM) 5 % Place 1 patch onto the skin every 12 (twelve) hours as needed (pain).    [provider]  methenamine (HIPREX) 1 g tablet in the morning and at bedtime. 05/10/21   [provider]  montelukast (SINGULAIR) 10 MG tablet Take 10 mg by mouth at bedtime.    [provider]  mupirocin ointment (BACTROBAN) 2 % as needed for rash. 04/18/21   [provider]  nitroGLYCERIN (NITROSTAT) 0.4 MG SL tablet Take up to 3 tablets as needed for chest pain. 05/17/21   Chandrasekhar, Mahesh A, MD  predniSONE (STERAPRED UNI-PAK 21 TAB) 10 MG (21) TBPK tablet Take by mouth daily. Take 6 tabs by mouth daily  for 2 days, then 5 tabs for 2 days, then 4 tabs for 2 days, then 3 tabs for 2 days, 2 tabs for 2 days, then 1 tab by mouth daily for 2 days 05/18/21   Isla Pence, MD  sildenafil (VIAGRA) 100 MG tablet Take 100 mg by mouth daily as needed for erectile dysfunction. 01/25/20   [provider]  tamsulosin (FLOMAX) 0.4 MG CAPS capsule Take 0.4 mg by mouth daily.    [provider]  Tiotropium Bromide Monohydrate (SPIRIVA RESPIMAT) 2.5 MCG/ACT AERS Inhale 2 puffs into the lungs daily.    [provider]  Tiotropium Bromide Monohydrate (SPIRIVA RESPIMAT) 2.5 MCG/ACT AERS Inhale 2 puffs into the lungs daily.    [provider]    Allergies    Patient has no known allergies.  Review of Systems   Review of Systems  Constitutional:  Negative for  fever.  Respiratory:  Positive for shortness of breath. Negative for cough.   Cardiovascular:  Positive for chest pain.  Gastrointestinal:  Positive for abdominal pain. Negative for blood in stool, diarrhea, nausea and vomiting.  Genitourinary:  Negative for dysuria.  All other systems reviewed and are negative.  Physical Exam Updated Vital Signs BP (!) 118/58    Pulse 65    Temp 97.6 F (36.4 C) (Oral)    Resp 18    SpO2 100%   Physical Exam Vitals and nursing note reviewed.  Constitutional:      Appearance: He is well-developed.     Comments: Chronically ill-appearing but nontoxic  HENT:     Head: Normocephalic and atraumatic.  Eyes:     Pupils: Pupils are equal, round, and reactive to light.  Cardiovascular:     Rate and Rhythm: Normal rate and regular rhythm.     Heart sounds: Normal heart sounds. No murmur heard. Pulmonary:     Effort: Pulmonary effort is normal. No respiratory distress.     Breath sounds: Wheezing present.     Comments: Occasional wheeze, O2 in place, no respiratory distress Abdominal:     General: Bowel sounds are normal.     Palpations: Abdomen is soft.     Tenderness: There is no abdominal tenderness. There is no rebound.  Musculoskeletal:     Cervical back: Neck supple.     Right lower leg: No tenderness. No edema.     Left lower leg: No tenderness. No edema.  Lymphadenopathy:     Cervical: No cervical adenopathy.  Skin:    General: Skin is warm and dry.  Neurological:     Mental Status: He is alert and oriented to person, place, and time.  Psychiatric:        Mood and Affect: Mood normal.    ED Results / Procedures / Treatments   Labs (all labs ordered are listed, but only abnormal results are displayed) Labs Reviewed  CBC - Abnormal; Notable for the following components:      Result Value   RBC 2.33 (*)    Hemoglobin 6.1 (*)    HCT 20.8 (*)    MCHC 29.3 (*)    nRBC 0.3 (*)    All other components within normal limits   COMPREHENSIVE METABOLIC PANEL - Abnormal; Notable for the following components:   CO2 17 (*)    Glucose, Bld 106 (*)    Creatinine, Ser 1.65 (*)    Calcium 8.7 (*)    GFR, Estimated 44 (*)    All other components within normal limits  HEMOGLOBIN AND HEMATOCRIT, BLOOD - Abnormal; Notable for the following components:   Hemoglobin 5.8 (*)    HCT 20.1 (*)    All other components within normal limits  POC OCCULT BLOOD, ED - Abnormal; Notable for the following components:   Fecal Occult Bld POSITIVE (*)    All other components within normal limits  RESP PANEL BY RT-PCR (FLU A&B, COVID) ARPGX2  TYPE AND SCREEN  PREPARE RBC (CROSSMATCH)  TROPONIN I (HIGH SENSITIVITY)  TROPONIN I (HIGH SENSITIVITY)    EKG EKG Interpretation  Date/Time:  Saturday June 17 2021 23:05:43 EST Ventricular Rate:  75 PR Interval:  160 QRS Duration: 78 QT Interval:  402 QTC Calculation: 448 R Axis:   55 Text Interpretation: Normal sinus rhythm Nonspecific T wave abnormality Abnormal ECG Confirmed by Thayer Jew [phone]) on 06/17/2021 11:57:03 PM  Radiology DG Chest Portable 1 View  Result Date: 06/17/2021 CLINICAL DATA:  Chest pain. EXAM: PORTABLE CHEST 1 VIEW COMPARISON:  PA Lat 05/18/2021. FINDINGS: The lungs emphysematous with scattered linear scarring but no evidence of focal pneumonia. No pleural effusion is seen. Heart size and vasculature are normal except for calcifications in the aortic arch. Osteopenia and slight thoracic levoscoliosis. IMPRESSION: No evidence of acute chest disease.  Stable COPD chest. Electronically Signed   By: Telford Nab M.D.   On: 06/17/2021 23:51    Procedures .Critical Care Performed by: Merryl Hacker, MD Authorized by: Merryl Hacker, MD   Critical care provider statement:    Critical care time (minutes):  45   Critical care was necessary to treat or prevent imminent or life-threatening deterioration of the following conditions:  Circulatory  failure (Symptomatic anemia)   Critical care was time spent personally by me on the following activities:  Development of treatment plan with patient or surrogate, discussions with consultants, evaluation of patient's response to treatment, examination of patient, ordering and review of laboratory studies, ordering and review of radiographic studies, ordering and performing treatments and interventions, pulse oximetry, re-evaluation of patient's condition and review of old charts   Medications Ordered in ED Medications  pantoprozole (PROTONIX) 80 mg /NS 100 mL infusion (has no administration in time range)  pantoprazole (PROTONIX) injection 40 mg (has no administration in time range)  0.9 %  sodium chloride infusion (has no administration in time range)  pantoprazole (PROTONIX) injection 40 mg (40 mg Intravenous Given 06/18/21 0106)    ED Course  I have reviewed the triage vital signs and the nursing notes.  Pertinent labs & imaging results that were available during my care of the patient were reviewed by me and considered in my medical decision making (see chart for details).    MDM Rules/Calculators/A&P                          Patient presents with chest pain.  He is overall nontoxic-appearing and vital signs are reassuring.  EKG is without acute ischemic changes.  Chest pain is waxing and waning.  He is currently chest pain-free.  Considerations include but not limited to, ACS, pneumonia, less likely PE as patient is blood thinner.  He also has some upper abdominal discomfort.  He does not take NSAIDs or drink alcohol.  Lab work notable for negative troponin.  Initial hemoglobin is 6.1.  This is acutely down from 13.0 10-month ago.  He is hemodynamically stable and not hypotensive.  Suspect subacute loss.  Hemoglobin was repeated and confirmed at 5.8.  Patient was typed and screened and given 1 unit of packed red cells.  He was grossly negative from below but Hemoccult positive.  Suspect GI  source.  He was given IV Protonix and started on Protonix drip.  Gastroenterology was added to the care team.  Spoke with the admitting hospitalist regarding admission for presumed GI bleed.  Troponin x2 negative.  Doubt acute ACS although this could be a symptom of symptomatic anemia given patient's history of coronary artery disease.     Final Clinical Impression(s) / ED Diagnoses Final diagnoses:  Symptomatic anemia  Atypical chest pain    Rx / DC Orders ED Discharge Orders     None        Merryl Hacker, MD 06/18/21 [phone]

## 2021-06-18 NOTE — H&P (View-Only) (Signed)
Referring Provider: [place] Primary Care Physician:  Vernie Shanks, MD Primary Gastroenterologist: Sadie Haber PCP  Reason for Consultation: Symptomatic anemia with hemoglobin of 5.9  HPI: Gregory Christensen is a 70 y.o. male with past medical history of coronary artery disease, chronic kidney disease, COPD on 2 L oxygen, history of PE on Eliquis presented to the hospital with fatigue and weakness.  Was also having melena.  Upon initial evaluation was found to have hemoglobin of 5.9.  Eliquis on hold and GI is consulted for further evaluation.   Patient seen and examined at bedside.  He is complaining of black-colored stool for last 2 weeks as well as worsening reflux.  Denies any trouble swallowing, nausea or vomiting.  Denies any abdominal pain.  Denies diarrhea or constipation.  Denies bright blood per rectum.    EGD in March 2021 for evaluation of anemia by Dr. Henrene Pastor was normal.  Colonoscopy showed 12 mm tubular adenoma in the rectum as well as few more adenomatous polyp scattered throughout the colon and diverticulosis .  Repeat colonoscopy was recommended in 3 years.  Past Medical History:  Diagnosis Date   Acute deep vein thrombosis (DVT) of left lower extremity (HCC)    Acute hypoxemic respiratory failure (HCC)    Anemia    Iron deficiency   Anxiety    Aortic atherosclerosis (HCC)    Arthritis    Benign prostatic hyperplasia 08/28/2018   UNSPECIFIED WHETHER LOWER URINARY TRACT SYMPTOMS PRESENT   CAD (coronary artery disease)    Cancer (HCC)    Chronic back pain    Chronic hip pain    CKD stage 3a 03/19/2021   Constipation    due to Iron M-W-F   COPD exacerbation ([place]) 10/18/2017   Dyspnea    Emphysema lung ([place])    Essential hypertension    GERD (gastroesophageal reflux disease)    Gout    Gross hematuria 09/08/2018   Head injury, acute, with loss of consciousness ([place])    unsure how long he was unresponsive   History of blood transfusion 05/2019   History of kidney stones     History of tobacco abuse    Hypoxia    Insomnia    Oxygen dependent    2l- 24/7   Pneumonia    in 1980's   Pre-diabetes    Pulmonary embolism ([place]) 05/31/2018   Renal mass    CONCERNING FOR RENAL CELL CARCINOMA DR. Lovena Neighbours   Respiratory distress    Respiratory failure, acute-on-chronic ([place]) 08/03/2018   Seasonal allergies    Sleep apnea     Past Surgical History:  Procedure Laterality Date   COLONOSCOPY WITH PROPOFOL N/A 09/27/2019   Procedure: COLONOSCOPY WITH PROPOFOL;  Surgeon: Irene Shipper, MD;  Location: [facility];  Service: Endoscopy;  Laterality: N/A;   CYSTOSCOPY/URETEROSCOPY/HOLMIUM LASER/STENT PLACEMENT Left 10/31/2018   Procedure: CYSTOSCOPY/URETEROSCOPY;  Surgeon: Ceasar Mons, MD;  Location: [facility];  Service: Urology;  Laterality: Left;   ESOPHAGOGASTRODUODENOSCOPY (EGD) WITH PROPOFOL N/A 09/27/2019   Procedure: ESOPHAGOGASTRODUODENOSCOPY (EGD) WITH PROPOFOL;  Surgeon: Irene Shipper, MD;  Location: [facility];  Service: Endoscopy;  Laterality: N/A;   HERNIA REPAIR Left 1977   inguinal   HYDROCELE EXCISION Left 05/13/2019   Procedure: HYDROCELECTOMY ADULT;  Surgeon: Ceasar Mons, MD;  Location: [facility];  Service: Urology;  Laterality: Left;   [place]   EXTENSIVE   POLYPECTOMY  09/27/2019   Procedure: POLYPECTOMY;  Surgeon: Irene Shipper, MD;  Location: [facility];  Service: Endoscopy;;   ROBOT ASSITED LAPAROSCOPIC NEPHROURETERECTOMY Left 10/31/2018   Procedure: XI ROBOT ASSITED LAPAROSCOPIC NEPHROURETERECTOMY;  Surgeon: Ceasar Mons, MD;  Location: [facility];  Service: Urology;  Laterality: Left;  ONLY NEEDS 240 MIN FOR ALL PROCEDURES   SHOULDER ARTHROSCOPY WITH ROTATOR CUFF REPAIR Left 04/12/2020   Procedure: LEFT SHOULDER ARTHROSCOPY, DEBRIDEMENT, BICEPS TENODESIS, MINI OPEN ROTATOR CUFF TEAR REPAIR;  Surgeon: Meredith Pel, MD;  Location: [facility];  Service: Orthopedics;  Laterality: Left;    Prior to Admission  medications   Medication Sig Start Date End Date Taking? Authorizing Provider  albuterol (PROVENTIL) (2.5 MG/3ML) 0.083% nebulizer solution Take 3 mLs (2.5 mg total) by nebulization every 6 (six) hours as needed for wheezing or shortness of breath. 08/04/18  Yes Reyne Dumas, MD  albuterol (VENTOLIN HFA) 108 (90 Base) MCG/ACT inhaler Inhale 2 puffs into the lungs every 2 (two) hours as needed for wheezing or shortness of breath. 04/13/21  Yes Freddi Starr, MD  amLODipine (NORVASC) 2.5 MG tablet Take 2.5 mg by mouth daily.   Yes [provider]  apixaban (ELIQUIS) 5 MG TABS tablet Take 1 tablet (5 mg total) by mouth 2 (two) times daily. 06/10/18  Yes Regalado, Belkys A, MD  ascorbic acid (VITAMIN C) 500 MG tablet in the morning and at bedtime. 05/10/21  Yes [provider]  atorvastatin (LIPITOR) 80 MG tablet Take 1 tablet (80 mg total) by mouth daily. 05/12/21  Yes Chandrasekhar, Mahesh A, MD  chlorhexidine (HIBICLENS) 4 % external liquid See admin instructions. 03/28/21  Yes [provider]  Dextran 70-Hypromellose (ARTIFICIAL TEARS) 0.1-0.3 % SOLN Place 1 drop into both eyes daily as needed (dry eyes).   Yes [provider]  diclofenac Sodium (VOLTAREN) 1 % GEL Apply 2 g topically 2 (two) times daily as needed (Pain).   Yes [provider]  docusate sodium (COLACE) 100 MG capsule Take 100 mg by mouth daily as needed for mild constipation.   Yes [provider]  fluticasone (FLONASE) 50 MCG/ACT nasal spray Place into both nostrils as needed for allergies or rhinitis.   Yes [provider]  fluticasone-salmeterol (ADVAIR) 250-50 MCG/ACT AEPB Inhale 1 puff into the lungs in the morning and at bedtime.   Yes [provider]  gabapentin (NEURONTIN) 300 MG capsule Take 300 mg by mouth 3 (three) times daily.   Yes [provider]  levocetirizine (XYZAL) 5 MG tablet Take 5 mg by mouth daily.   Yes [provider]   lidocaine (LIDODERM) 5 % Place 1 patch onto the skin every 12 (twelve) hours as needed (pain).   Yes [provider]  methenamine (HIPREX) 1 g tablet in the morning and at bedtime. 05/10/21  Yes [provider]  montelukast (SINGULAIR) 10 MG tablet Take 10 mg by mouth at bedtime.   Yes [provider]  nitroGLYCERIN (NITROSTAT) 0.4 MG SL tablet Take up to 3 tablets as needed for chest pain. 05/17/21  Yes Chandrasekhar, Mahesh A, MD  predniSONE (STERAPRED UNI-PAK 21 TAB) 10 MG (21) TBPK tablet Take by mouth daily. Take 6 tabs by mouth daily  for 2 days, then 5 tabs for 2 days, then 4 tabs for 2 days, then 3 tabs for 2 days, 2 tabs for 2 days, then 1 tab by mouth daily for 2 days Patient not taking: Reported on 06/18/2021 05/18/21   Isla Pence, MD  tamsulosin (FLOMAX) 0.4 MG CAPS capsule Take 0.4 mg by mouth  daily.   Yes [provider]  Tiotropium Bromide Monohydrate (SPIRIVA RESPIMAT) 2.5 MCG/ACT AERS Inhale 2 puffs into the lungs daily.   Yes [provider]  diltiazem (CARDIZEM) 30 MG tablet Take 2 hours prior to Cardiac CT Patient not taking: Reported on 06/18/2021 05/12/21   Werner Lean, MD  mupirocin ointment (BACTROBAN) 2 % as needed for rash. Patient not taking: Reported on 06/18/2021 04/18/21   [provider]  sildenafil (VIAGRA) 100 MG tablet Take 100 mg by mouth daily as needed for erectile dysfunction. 01/25/20   [provider]  Tiotropium Bromide Monohydrate (SPIRIVA RESPIMAT) 2.5 MCG/ACT AERS Inhale 2 puffs into the lungs daily. Patient not taking: Reported on 06/18/2021    [provider]    Scheduled Meds:  sodium chloride   Intravenous Once   fluticasone furoate-vilanterol  1 puff Inhalation Daily   [START ON 06/21/2021] pantoprazole  40 mg Intravenous Q12H   tamsulosin  0.4 mg Oral Daily   umeclidinium bromide  1 puff Inhalation Daily   Continuous Infusions:  sodium chloride Stopped  (06/18/21 0346)   lactated ringers 75 mL/hr at 06/18/21 0646   pantoprazole 8 mg/hr (06/18/21 0348)   PRN Meds:.acetaminophen **OR** acetaminophen, albuterol, ondansetron **OR** ondansetron (ZOFRAN) IV  Allergies as of 06/17/2021   (No Known Allergies)    Family History  Problem Relation Age of Onset   Diabetes Mother    Hypertension Sister    Hypertension Brother     Social History   Socioeconomic History   Marital status: Divorced    Spouse name: Not on file   Number of children: Not on file   Years of education: Not on file   Highest education level: Not on file  Occupational History   Not on file  Tobacco Use   Smoking status: Former    Packs/day: 0.50    Years: 57.00    Pack years: 28.50    Types: Cigarettes   Smokeless tobacco: Never  Vaping Use   Vaping Use: Never used  Substance and Sexual Activity   Alcohol use: Not Currently    Alcohol/week: 10.0 standard drinks    Types: 10 Glasses of wine per week    Comment: 0-2 beers a day    Drug use: Not Currently    Types: Flunitrazepam   Sexual activity: Not on file  Other Topics Concern   Not on file  Social History Narrative   Not on file   Social Determinants of Health   Financial Resource Strain: Not on file  Food Insecurity: Not on file  Transportation Needs: Not on file  Physical Activity: Not on file  Stress: Not on file  Social Connections: Not on file  Intimate Partner Violence: Not on file    Review of Systems: All negative except as stated above in HPI.  Physical Exam: Vital signs: Vitals:   06/18/21 0932 06/18/21 0953  BP: 115/67 101/72  Pulse: 73 72  Resp: 18 18  Temp: 97.8 F (36.6 C) (!) 97.2 F (36.2 C)  SpO2: 100% 100%     General:   Alert,  Well-developed, well-nourished, pleasant and cooperative in NAD, oxygen by nasal cannula Lungs: Anterior exam only, decreased breath sounds bilaterally, Heart:  Regular rate and rhythm; no murmurs, clicks, rubs,  or gallops. Abdomen:  Soft, nontender, nondistended, bowel sounds present. Neuro : Alert and oriented x3 Psych : Mood and affect normal Rectal:  Deferred  GI:  Lab Results: Recent Labs    06/17/21  2318 06/18/21 0038 06/18/21 0700  WBC 5.9  --  5.1  HGB 6.1* 5.8* 5.7*  HCT 20.8* 20.1* 19.2*  PLT 306  --  277   BMET Recent Labs    06/17/21 2318 06/18/21 0700  NA 137 137  K 4.0 4.0  CL 111 111  CO2 17* 20*  GLUCOSE 106* 94  BUN 17 17  CREATININE 1.65* 1.63*  CALCIUM 8.7* 8.7*   LFT Recent Labs    06/17/21 2318  PROT 6.7  ALBUMIN 3.5  AST 21  ALT 18  ALKPHOS 65  BILITOT 0.5   PT/INR No results for input(s): LABPROT, INR in the last 72 hours.   Studies/Results: DG Chest Portable 1 View  Result Date: 06/17/2021 CLINICAL DATA:  Chest pain. EXAM: PORTABLE CHEST 1 VIEW COMPARISON:  PA Lat 05/18/2021. FINDINGS: The lungs emphysematous with scattered linear scarring but no evidence of focal pneumonia. No pleural effusion is seen. Heart size and vasculature are normal except for calcifications in the aortic arch. Osteopenia and slight thoracic levoscoliosis. IMPRESSION: No evidence of acute chest disease.  Stable COPD chest. Electronically Signed   By: Telford Nab M.D.   On: 06/17/2021 23:51    Impression/Plan: -Symptomatic anemia with melena.  Hemoglobin of 5.9 on admission.  EGD in March 2021 was normal. -COPD oxygen supplement -Pulmonary embolism.  Was on Eliquis.  Last dose yesterday morning.  Recommendations --------------------------- -Okay to have full liquid diet today -Patient is currently getting is secondary to blood transfusion. -Plan for EGD tomorrow if hemoglobin more than 7 -Continue Protonix -N.p.o. past midnight  Risks (bleeding, infection, bowel perforation that could require surgery, sedation-related changes in cardiopulmonary systems), benefits (identification and possible treatment of source of symptoms, exclusion of certain causes of symptoms), and alternatives  (watchful waiting, radiographic imaging studies, empiric medical treatment)  were explained to patient/family in detail and patient wishes to proceed.     LOS: 0 days   Otis Brace  MD, FACP 06/18/2021, 12:13 PM  Contact #  [phone]

## 2021-06-18 NOTE — Progress Notes (Signed)
70 year old gentleman with history of hypertension, CAD, CKD stage IIIa, COPD on 2 L of home oxygen chronically and history of Eliquis and PE presented with chest pain, fatigue and weakness as well as history of melena.  Was diagnosed with severe anemia with hemoglobin of 5.9.  Eliquis held.  2 units of PRBC transfusion ordered, first unit going on right now when I am seeing this patient today.  He has no more chest pain or shortness of breath.  All cardiac enzymes are negative.  GI is consulted.  Would likely benefit from EGD.  Continue to hold Eliquis until further clarification from GI.  Repeat H&H 2 hours after second unit of transfusion.  Keep hemoglobin over 7.

## 2021-06-18 NOTE — H&P (Signed)
History and Physical    Gregory Christensen FYB:[id number] DOB: Apr 28, 1951 DOA: 06/17/2021  PCP: Vernie Shanks, MD   Patient coming from: Home  Chief Complaint:   Chest pain, fatigue, weakness  HPI: Gregory Christensen is a 70 y.o. male with medical history significant for HTN, CAD, CKD 3A, COPD on 2 L of oxygen at home, hx of PE on eliquis who presents for evaluation of chest pain, fatigue.  He reports having intermittent chest pain for the last 1 to 2 weeks.  Chest pain generally occurs if he is doing some type of activity. He states that the chest pain waxes and wanes.  He did use nitroglycerin a few times at home which did seem to help.  He has shortness of breath with the chest pain.  He also reports having shortness of breath with exertion.  He has had intermittent indigestion and heartburn and will use Pepto-Bismol for his indigestion.  He states he last took Pepto-Bismol a month ago.  He states that 3 weeks ago he noticed he was having black tarry stools that have continued.  He has not had any bright red blood in rectum and he has had no coffee-ground emesis.  He has no chest pain at this time.  States he has not had any fever or cough.  He has had no known sick contacts.  Patient had anemia that required transfusion and Hemoccult positive stool and early 2021 and underwent upper endoscopy and colonoscopy at that time with no identified source of bleeding. He has a history of smoking but quit over 20 years ago.  He denies alcohol or illicit drug use.  ED Course: Gregory Christensen has remained hemodynamically stable in the emergency room.  He was found to have significant anemia with a hemoglobin of 5.8-6.1.  Cardiac work-up was not negative with no ischemic changes on EKG and troponin of 4 on 2 occasions in the emergency room.  He was Hemoccult positive in the emergency room.  WBC 5900 hemoglobin 6.1 hematocrit 20.8 platelets 306,000 sodium 137 potassium 4.0 chloride 111 bicarb 17 creatinine 1.65 BUN 17 glucose  106 alkaline phosphatase 65 AST 21 ALT 18.  COVID test is pending.  ER physician consulted gastroenterology and sent secure chat for GI to see patient in the morning  Review of Systems:  General: Denies fever, chills, weight loss, night sweats.  Denies dizziness.  Denies change in appetite HENT: Denies head trauma, headache, denies change in hearing, tinnitus.  Denies nasal congestion or bleeding.  Denies sore throat, sores in mouth.  Denies difficulty swallowing Eyes: Denies blurry vision, pain in eye, drainage.  Denies discoloration of eyes. Neck: Denies pain.  Denies swelling.  Denies pain with movement. Cardiovascular: Reports intermittent chest pain. Denies palpitations.  Denies edema.  Denies orthopnea Respiratory: Reports shortness of breath with exertion. Denies cough. Denies sputum production Gastrointestinal: Denies abdominal pain, swelling. Denies nausea, vomiting, diarrhea.  Reports melena.  Denies hematemesis. Musculoskeletal: Denies limitation of movement.  Denies deformity or swelling. Denies arthralgias or myalgias. Genitourinary: Denies pelvic pain.  Denies urinary frequency or hesitancy.  Denies dysuria.  Skin: Denies rash.  Denies petechiae, purpura, ecchymosis. Neurological:Denies syncope. Denies seizure activity. Denies paresthesia. Denies slurred speech, drooping face.  Denies visual change. Psychiatric: Denies depression, anxiety. Denies hallucinations.  Past Medical History:  Diagnosis Date   Acute deep vein thrombosis (DVT) of left lower extremity (HCC)    Acute hypoxemic respiratory failure (HCC)    Anemia    Iron deficiency  Anxiety    Aortic atherosclerosis (HCC)    Arthritis    Benign prostatic hyperplasia 08/28/2018   UNSPECIFIED WHETHER LOWER URINARY TRACT SYMPTOMS PRESENT   CAD (coronary artery disease)    Cancer (HCC)    Chronic back pain    Chronic hip pain    CKD stage 3a 03/19/2021   Constipation    due to Iron M-W-F   COPD exacerbation ([place])  10/18/2017   Dyspnea    Emphysema lung (HCC)    Essential hypertension    GERD (gastroesophageal reflux disease)    Gout    Gross hematuria 09/08/2018   Head injury, acute, with loss of consciousness ([place])    unsure how long he was unresponsive   History of blood transfusion 05/2019   History of kidney stones    History of tobacco abuse    Hypoxia    Insomnia    Oxygen dependent    2l- 24/7   Pneumonia    in 1980's   Pre-diabetes    Pulmonary embolism ([place]) 05/31/2018   Renal mass    CONCERNING FOR RENAL CELL CARCINOMA DR. Lovena Neighbours   Respiratory distress    Respiratory failure, acute-on-chronic ([place]) 08/03/2018   Seasonal allergies    Sleep apnea     Past Surgical History:  Procedure Laterality Date   COLONOSCOPY WITH PROPOFOL N/A 09/27/2019   Procedure: COLONOSCOPY WITH PROPOFOL;  Surgeon: Irene Shipper, MD;  Location: [facility];  Service: Endoscopy;  Laterality: N/A;   CYSTOSCOPY/URETEROSCOPY/HOLMIUM LASER/STENT PLACEMENT Left 10/31/2018   Procedure: CYSTOSCOPY/URETEROSCOPY;  Surgeon: Ceasar Mons, MD;  Location: [facility];  Service: Urology;  Laterality: Left;   ESOPHAGOGASTRODUODENOSCOPY (EGD) WITH PROPOFOL N/A 09/27/2019   Procedure: ESOPHAGOGASTRODUODENOSCOPY (EGD) WITH PROPOFOL;  Surgeon: Irene Shipper, MD;  Location: [facility];  Service: Endoscopy;  Laterality: N/A;   HERNIA REPAIR Left 1977   inguinal   HYDROCELE EXCISION Left 05/13/2019   Procedure: HYDROCELECTOMY ADULT;  Surgeon: Ceasar Mons, MD;  Location: [facility];  Service: Urology;  Laterality: Left;   [place]   EXTENSIVE   POLYPECTOMY  09/27/2019   Procedure: POLYPECTOMY;  Surgeon: Irene Shipper, MD;  Location: [facility];  Service: Endoscopy;;   ROBOT ASSITED LAPAROSCOPIC NEPHROURETERECTOMY Left 10/31/2018   Procedure: XI ROBOT ASSITED LAPAROSCOPIC NEPHROURETERECTOMY;  Surgeon: Ceasar Mons, MD;  Location: [facility];  Service: Urology;  Laterality: Left;  ONLY  NEEDS 240 MIN FOR ALL PROCEDURES   SHOULDER ARTHROSCOPY WITH ROTATOR CUFF REPAIR Left 04/12/2020   Procedure: LEFT SHOULDER ARTHROSCOPY, DEBRIDEMENT, BICEPS TENODESIS, MINI OPEN ROTATOR CUFF TEAR REPAIR;  Surgeon: Meredith Pel, MD;  Location: [facility];  Service: Orthopedics;  Laterality: Left;    Social History  reports that he has quit smoking. His smoking use included cigarettes. He has a 28.50 pack-year smoking history. He has never used smokeless tobacco. He reports that he does not currently use alcohol after a past usage of about 10.0 standard drinks per week. He reports that he does not currently use drugs after having used the following drugs: Flunitrazepam.  No Known Allergies  Family History  Problem Relation Age of Onset   Diabetes Mother    Hypertension Sister    Hypertension Brother      Prior to Admission medications   Medication Sig Start Date End Date Taking? Authorizing Provider  predniSONE (STERAPRED UNI-PAK 21 TAB) 10 MG (21) TBPK tablet Take by mouth daily. Take 6 tabs by mouth daily  for 2 days,  then 5 tabs for 2 days, then 4 tabs for 2 days, then 3 tabs for 2 days, 2 tabs for 2 days, then 1 tab by mouth daily for 2 days 05/18/21   Isla Pence, MD  albuterol (PROVENTIL) (2.5 MG/3ML) 0.083% nebulizer solution Take 3 mLs (2.5 mg total) by nebulization every 6 (six) hours as needed for wheezing or shortness of breath. 08/04/18   Reyne Dumas, MD  albuterol (VENTOLIN HFA) 108 (90 Base) MCG/ACT inhaler Inhale 2 puffs into the lungs every 2 (two) hours as needed for wheezing or shortness of breath. 04/13/21   Freddi Starr, MD  amLODipine (NORVASC) 2.5 MG tablet Take 2.5 mg by mouth daily.    [provider]  apixaban (ELIQUIS) 5 MG TABS tablet Take 1 tablet (5 mg total) by mouth 2 (two) times daily. 06/10/18   Regalado, Belkys A, MD  ascorbic acid (VITAMIN C) 500 MG tablet in the morning and at bedtime. 05/10/21   [provider]  atorvastatin  (LIPITOR) 80 MG tablet Take 1 tablet (80 mg total) by mouth daily. 05/12/21   Chandrasekhar, Lyda Kalata A, MD  chlorhexidine (HIBICLENS) 4 % external liquid See admin instructions. 03/28/21   [provider]  Dextran 70-Hypromellose (ARTIFICIAL TEARS) 0.1-0.3 % SOLN Place 1 drop into both eyes daily as needed (dry eyes).    [provider]  diclofenac Sodium (VOLTAREN) 1 % GEL Apply 2 g topically 2 (two) times daily as needed (Pain).    [provider]  diltiazem (CARDIZEM) 30 MG tablet Take 2 hours prior to Cardiac CT 05/12/21   Rudean Haskell A, MD  docusate sodium (COLACE) 100 MG capsule Take 100 mg by mouth daily as needed for mild constipation.    [provider]  fluticasone (FLONASE) 50 MCG/ACT nasal spray Place into both nostrils as needed for allergies or rhinitis.    [provider]  fluticasone-salmeterol (WIXELA INHUB) 250-50 MCG/ACT AEPB Inhale 1 puff into the lungs in the morning and at bedtime.    [provider]  gabapentin (NEURONTIN) 300 MG capsule Take 300-600 mg by mouth See admin instructions. Takes 1 capsule in the morning and 2 capsule at night    [provider]  levocetirizine (XYZAL) 5 MG tablet Take 5 mg by mouth daily.    [provider]  lidocaine (LIDODERM) 5 % Place 1 patch onto the skin every 12 (twelve) hours as needed (pain).    [provider]  methenamine (HIPREX) 1 g tablet in the morning and at bedtime. 05/10/21   [provider]  montelukast (SINGULAIR) 10 MG tablet Take 10 mg by mouth at bedtime.    [provider]  mupirocin ointment (BACTROBAN) 2 % as needed for rash. 04/18/21   [provider]  nitroGLYCERIN (NITROSTAT) 0.4 MG SL tablet Take up to 3 tablets as needed for chest pain. 05/17/21   Chandrasekhar, Mahesh A, MD  predniSONE (STERAPRED UNI-PAK 21 TAB) 10 MG (21) TBPK tablet Take by mouth daily. Take 6 tabs by mouth daily  for 2 days, then 5  tabs for 2 days, then 4 tabs for 2 days, then 3 tabs for 2 days, 2 tabs for 2 days, then 1 tab by mouth daily for 2 days 05/18/21   Isla Pence, MD  sildenafil (VIAGRA) 100 MG tablet Take 100 mg by mouth daily as needed for erectile dysfunction. 01/25/20   [provider]  tamsulosin (FLOMAX) 0.4 MG CAPS capsule Take 0.4 mg by mouth daily.  [provider]  Tiotropium Bromide Monohydrate (SPIRIVA RESPIMAT) 2.5 MCG/ACT AERS Inhale 2 puffs into the lungs daily.    [provider]  Tiotropium Bromide Monohydrate (SPIRIVA RESPIMAT) 2.5 MCG/ACT AERS Inhale 2 puffs into the lungs daily.    [provider]    Physical Exam: Vitals:   06/17/21 2301 06/18/21 0045 06/18/21 0230 06/18/21 0245  BP: 104/61 138/61 134/72 (!) 118/58  Pulse: 78 68 81 65  Resp: (!) 22 (!) 25 18 18   Temp: 97.6 F (36.4 C)     TempSrc: Oral     SpO2: 98% 100% 96% 100%    Constitutional: NAD, calm, comfortable Vitals:   06/17/21 2301 06/18/21 0045 06/18/21 0230 06/18/21 0245  BP: 104/61 138/61 134/72 (!) 118/58  Pulse: 78 68 81 65  Resp: (!) 22 (!) 25 18 18   Temp: 97.6 F (36.4 C)     TempSrc: Oral     SpO2: 98% 100% 96% 100%   General: WDWN, Alert and oriented x3.  Eyes: EOMI, PERRL, conjunctivae pale.  Sclera nonicteric HENT:  [place]/AT, external ears normal.  Nares patent without epistasis.  Mucous membranes are moist Neck: Soft, normal range of motion, supple, no masses, Trachea midline Respiratory: Equal but diminished breath sounds bilaterally with diffuse rales and expiratory wheezing, no crackles. Normal respiratory effort. No accessory muscle use.  Cardiovascular: Regular rate and rhythm, no murmurs / rubs / gallops. No extremity edema. 2+ pedal pulses.  Abdomen: Soft, no tenderness, nondistended, no rebound or guarding. No masses palpated. Bowel sounds normoactive Musculoskeletal: FROM. no cyanosis. No joint deformity upper and lower extremities. Normal muscle tone.   Skin: Warm, dry, intact no rashes, lesions, ulcers. No induration Neurologic: CN 2-12 grossly intact. Normal speech. Sensation intact, Strength 4/5 in all extremities.   Psychiatric: Normal judgment and insight. Normal mood.   Labs on Admission: I have personally reviewed following labs and imaging studies  CBC: Recent Labs  Lab 06/17/21 2318 06/18/21 0038  WBC 5.9  --   HGB 6.1* 5.8*  HCT 20.8* 20.1*  MCV 89.3  --   PLT 306  --     Basic Metabolic Panel: Recent Labs  Lab 06/17/21 2318  NA 137  K 4.0  CL 111  CO2 17*  GLUCOSE 106*  BUN 17  CREATININE 1.65*  CALCIUM 8.7*    GFR: CrCl cannot be calculated (Unknown ideal weight.).  Liver Function Tests: Recent Labs  Lab 06/17/21 2318  AST 21  ALT 18  ALKPHOS 65  BILITOT 0.5  PROT 6.7  ALBUMIN 3.5    Urine analysis:    Component Value Date/Time   COLORURINE YELLOW 12/20/2020 1135   APPEARANCEUR CLEAR 12/20/2020 1135   LABSPEC 1.013 12/20/2020 1135   PHURINE 6.0 12/20/2020 1135   GLUCOSEU NEGATIVE 12/20/2020 1135   HGBUR SMALL (A) 12/20/2020 1135   BILIRUBINUR NEGATIVE 12/20/2020 1135   KETONESUR NEGATIVE 12/20/2020 1135   PROTEINUR NEGATIVE 12/20/2020 1135   NITRITE NEGATIVE 12/20/2020 1135   LEUKOCYTESUR TRACE (A) 12/20/2020 1135    Radiological Exams on Admission: DG Chest Portable 1 View  Result Date: 06/17/2021 CLINICAL DATA:  Chest pain. EXAM: PORTABLE CHEST 1 VIEW COMPARISON:  PA Lat 05/18/2021. FINDINGS: The lungs emphysematous with scattered linear scarring but no evidence of focal pneumonia. No pleural effusion is seen. Heart size and vasculature are normal except for calcifications in the aortic arch. Osteopenia and slight thoracic levoscoliosis. IMPRESSION: No evidence of acute chest disease.  Stable COPD chest. Electronically Signed  By: Telford Nab M.D.   On: 06/17/2021 23:51    EKG: Independently reviewed.  EKG shows normal sinus rhythm with nonspecific ST changes but no acute ST  elevation or depression.  No ischemic changes noted.  QTc 448  Assessment/Plan Principal Problem:   Symptomatic anemia Gregory Christensen is admitted to Progressive care unit.  Transfuse 2 units of PRBC. Given tylenol and benadryl before transfusion and lasix between units.  Check Hgb/Hct  2 hours after transfusion completed.  Active Problems:   GI bleed Gastroenterology consulted to see pt in am for further workup to identify source of blood loss. Hold Eliquis    CKD stage 3a Stable.  Monitor renal function and labs in am. IVF hydration with LR at 75 ml/hr    COPD (chronic obstructive pulmonary disease) Breo and spiriva ordered. Albuterol as needed Incentive spirometer every 2 hours while awake    Essential hypertension Pharmacy to verify and reconcile home medications and then will resume    Chronic anticoagulation Patient is on Eliquis.  He had PE in 2019 and has been on Eliquis since then    Heme positive stool   DVT prophylaxis: SCDs for DVT prophylaxis.   Code Status:   Full Code  Family Communication:  Diagnosis and plan discussed with patient.  He verbalized understanding and agrees with plan.  Questions answered.  Further recommendations to follow as clinical indicated Disposition Plan:   Patient is from:  Home  Anticipated DC to:  Home  Anticipated DC date:  Anticipate 2 midnight or more stay to treat acute condition  Consults called:  Gastroenterology consulted by ER physician by secure chat to see pt in am  Admission status:  Inpatient  Yevonne Aline Momin Misko MD [facility]  How to contact the [facility] Attending or Consulting provider [place] or covering provider during after hours [place], for this patient?   Check the care team in [facility] and look for a) attending/consulting [facility] provider listed and b) the [facility] team listed Log into [email or web address] and use [place]'s universal password to access. If you do not have the password, please contact the hospital  operator. Locate the [facility] provider you are looking for under [facility] and page to a number that you can be directly reached. If you still have difficulty reaching the provider, please page the [facility] (Director on Call) for the Hospitalists listed on amion for assistance.  06/18/2021, 4:01 AM

## 2021-06-19 ENCOUNTER — Encounter (HOSPITAL_COMMUNITY): Payer: Self-pay | Admitting: Family Medicine

## 2021-06-19 ENCOUNTER — Inpatient Hospital Stay (HOSPITAL_COMMUNITY): Payer: No Typology Code available for payment source | Admitting: Certified Registered"

## 2021-06-19 ENCOUNTER — Encounter (HOSPITAL_COMMUNITY)
Admission: EM | Disposition: A | Discharge status: Home / Self Care | Payer: Self-pay | Source: Home / Self Care | Attending: Internal Medicine

## 2021-06-19 HISTORY — PX: BIOPSY: SHX5522

## 2021-06-19 HISTORY — PX: ESOPHAGOGASTRODUODENOSCOPY (EGD) WITH PROPOFOL: SHX5813

## 2021-06-19 LAB — CBC
HCT: 27.1 % — ABNORMAL LOW (ref 39.0–52.0)
Hemoglobin: 8.6 g/dL — ABNORMAL LOW (ref 13.0–17.0)
MCH: 27.3 pg (ref 26.0–34.0)
MCHC: 31.7 g/dL (ref 30.0–36.0)
MCV: 86 fL (ref 80.0–100.0)
Platelets: 302 10*3/uL (ref 150–400)
RBC: 3.15 MIL/uL — ABNORMAL LOW (ref 4.22–5.81)
RDW: 13.7 % (ref 11.5–15.5)
WBC: 5.6 10*3/uL (ref 4.0–10.5)
nRBC: 0 % (ref 0.0–0.2)

## 2021-06-19 LAB — BASIC METABOLIC PANEL
Anion gap: 5 (ref 5–15)
BUN: 15 mg/dL (ref 8–23)
CO2: 22 mmol/L (ref 22–32)
Calcium: 8.4 mg/dL — ABNORMAL LOW (ref 8.9–10.3)
Chloride: 109 mmol/L (ref 98–111)
Creatinine, Ser: 1.66 mg/dL — ABNORMAL HIGH (ref 0.61–1.24)
GFR, Estimated: 44 mL/min — ABNORMAL LOW (ref >60)
Glucose, Bld: 96 mg/dL (ref 70–99)
Potassium: 4 mmol/L (ref 3.5–5.1)
Sodium: 136 mmol/L (ref 135–145)

## 2021-06-19 LAB — TROPONIN I (HIGH SENSITIVITY): Troponin I (High Sensitivity): 5 ng/L (ref <18)

## 2021-06-19 LAB — GLUCOSE, CAPILLARY: Glucose-Capillary: 73 mg/dL (ref 70–99)

## 2021-06-19 SURGERY — ESOPHAGOGASTRODUODENOSCOPY (EGD) WITH PROPOFOL
Anesthesia: Monitor Anesthesia Care

## 2021-06-19 MED ORDER — PROPOFOL 10 MG/ML IV BOLUS
INTRAVENOUS | Status: DC | PRN
  Administered 2021-06-19: 25 mg via INTRAVENOUS
  Administered 2021-06-19: 20 mg via INTRAVENOUS

## 2021-06-19 MED ORDER — SODIUM CHLORIDE 0.9 % IV SOLN: INTRAVENOUS | Status: DC

## 2021-06-19 MED ORDER — GLYCOPYRROLATE PF 0.2 MG/ML IJ SOSY
PREFILLED_SYRINGE | INTRAMUSCULAR | Status: DC | PRN
  Administered 2021-06-19: .1 mg via INTRAVENOUS

## 2021-06-19 MED ORDER — NITROGLYCERIN 0.4 MG SL SUBL
SUBLINGUAL_TABLET | SUBLINGUAL | Status: AC
  Filled 2021-06-19: qty 1

## 2021-06-19 MED ORDER — SODIUM CHLORIDE 0.9 % IV SOLN
INTRAVENOUS | Status: AC | PRN
  Administered 2021-06-19: 1000 mL via INTRAVENOUS

## 2021-06-19 MED ORDER — NITROGLYCERIN 0.4 MG SL SUBL
0.4000 mg | SUBLINGUAL_TABLET | SUBLINGUAL | Status: DC | PRN
  Administered 2021-06-19 – 2021-06-20 (×4): 0.4 mg via SUBLINGUAL

## 2021-06-19 MED ORDER — MORPHINE SULFATE (PF) 2 MG/ML IV SOLN
2.0000 mg | INTRAVENOUS | Status: DC | PRN
  Administered 2021-06-19 – 2021-06-20 (×2): 2 mg via INTRAVENOUS
  Filled 2021-06-19 (×2): qty 1

## 2021-06-19 MED ORDER — PHENYLEPHRINE 40 MCG/ML (10ML) SYRINGE FOR IV PUSH (FOR BLOOD PRESSURE SUPPORT)
PREFILLED_SYRINGE | INTRAVENOUS | Status: DC | PRN
  Administered 2021-06-19: 80 ug via INTRAVENOUS

## 2021-06-19 MED ORDER — PROPOFOL 500 MG/50ML IV EMUL
INTRAVENOUS | Status: DC | PRN
  Administered 2021-06-19: 100 ug/kg/min via INTRAVENOUS

## 2021-06-19 MED ORDER — ONDANSETRON HCL 4 MG/2ML IJ SOLN: 4.0000 mg | Freq: Four times a day (QID) | INTRAMUSCULAR | Status: DC | PRN

## 2021-06-19 MED ORDER — ALUM & MAG HYDROXIDE-SIMETH 200-200-20 MG/5ML PO SUSP
30.0000 mL | Freq: Once | ORAL | Status: AC
  Administered 2021-06-19: 19:00:00 30 mL via ORAL
  Filled 2021-06-19: qty 30

## 2021-06-19 MED ORDER — LIDOCAINE 2% (20 MG/ML) 5 ML SYRINGE
INTRAMUSCULAR | Status: DC | PRN
  Administered 2021-06-19: 80 mg via INTRAVENOUS

## 2021-06-19 SURGICAL SUPPLY — 15 items

## 2021-06-19 NOTE — Progress Notes (Signed)
AT [facility] informed nurse that pt called out statiing he had chest pain.  Nurse entered the room and pt stated it was in the middle of his chest and felt like pressure.  Nurse informed md by text and retrieved a order for Nitroglycerin.  EKG was performed at bedside by nurse tech.  Read Nonspecific ST and T wave abnormality. QT/QTC 428/437.  Charge nurse at bedside with primary nurse. Increased pts oxygen to 6l due to stating SOB. MD notified. First nitro given @ 1717pm, with unrelieved pain after. Second Nitro given @ [id number], with unrelieved pain and radiation down left arm. Called RR nurse to bedside. Assessed and had pt sit on side of the bed. Pt was having more GI issues. Pt was belching and stated he felt better sitting on the side of the bed. MD ordred to increase current LR infusion to 144ml/hr, morphine 2mg  IV x1, and labs.  Nurse administered morphine and had pt to lie down. Will continue to monitor.

## 2021-06-19 NOTE — Op Note (Signed)
[facility] Patient Name: Gregory Christensen Procedure Date : 06/19/2021 MRN: [id number] Attending MD: [name] , MD Date of Birth: 1951/02/14 CSN: [id number] Age: 70 Admit Type: Inpatient Procedure:                Upper GI endoscopy Indications:              Suspected upper gastrointestinal bleeding, Melena Providers:                [name], MD, Dulcy Fanny, [facility] Technician, Technician Referring MD:             hospital team Medicines:                Propofol per Anesthesia, Monitored Anesthesia Care Complications:            No immediate complications. Estimated Blood Loss:     Estimated blood loss was minimal. Procedure:                Pre-Anesthesia Assessment:                           - Prior to the procedure, a History and Physical                            was performed, and patient medications and                            allergies were reviewed. The patient's tolerance of                            previous anesthesia was also reviewed. The risks                            and benefits of the procedure and the sedation                            options and risks were discussed with the patient.                            All questions were answered, and informed consent                            was obtained. Prior Anticoagulants: The patient has                            taken Eliquis (apixaban), last dose was 2 days                            prior to procedure. ASA Grade Assessment: III - A                            patient with severe systemic disease. After  reviewing the risks and benefits, the patient was                            deemed in satisfactory condition to undergo the                            procedure.                           After obtaining informed consent, the endoscope was                            passed under direct vision. Throughout the                             procedure, the patient's blood pressure, pulse, and                            oxygen saturations were monitored continuously. The                            [id number]-[id number] ([id number]) Olympus endoscope was introduced                            through the mouth, and advanced to the second part                            of duodenum. The upper GI endoscopy was                            accomplished without difficulty. The patient                            tolerated the procedure. Scope In: Scope Out: Findings:      The examined esophagus was normal.      The Z-line was regular and was found 40 cm from the incisors.      Segmental moderate inflammation characterized by congestion (edema) and       erythema was found in the gastric antrum and in the prepyloric region of       the stomach. Biopsies were taken with a cold forceps for histology.       Estimated blood loss was minimal.      The exam of the stomach was otherwise normal.      The examined duodenum was normal. No active bleeding or blood products       seen prior to biopsies. Impression:               - Normal esophagus.                           - Z-line regular, 40 cm from the incisors.                           - Gastritis. Biopsied.                           -  Normal examined duodenum. Recommendation:           - Clear liquid diet.                           - Await pathology results.                           - Observe patient's clinical course. Procedure Code(s):        --- Professional ---                           [phone], Esophagogastroduodenoscopy, flexible,                            transoral; with biopsy, single or multiple Diagnosis Code(s):        --- Professional ---                           K92.1, Melena (includes Hematochezia)                           K29.70, Gastritis, unspecified, without bleeding CPT copyright 2019 American Medical Association. All rights reserved. The codes documented in  this report are preliminary and upon coder review may  be revised to meet current compliance requirements. [name], MD 06/19/2021 9:14:54 AM This report has been signed electronically. Number of Addenda: 0

## 2021-06-19 NOTE — Anesthesia Preprocedure Evaluation (Signed)
Anesthesia Evaluation  Patient identified by MRN, date of birth, ID band Patient awake    Reviewed: Allergy & Precautions, H&P , NPO status , Patient's Chart, lab work & pertinent test results  Airway Mallampati: II   Neck ROM: full    Dental   Pulmonary shortness of breath, sleep apnea , COPD, former smoker,    breath sounds clear to auscultation       Cardiovascular hypertension, + CAD   Rhythm:regular Rate:Normal  TTE (03/2021): EF 65% Stress test (10/2018): no ischemia   Neuro/Psych PSYCHIATRIC DISORDERS Anxiety    GI/Hepatic GERD  ,melena   Endo/Other  diabetes, Type 2  Renal/GU Renal InsufficiencyRenal disease     Musculoskeletal  (+) Arthritis ,   Abdominal   Peds  Hematology   Anesthesia Other Findings   Reproductive/Obstetrics                             Anesthesia Physical Anesthesia Plan  ASA: 3  Anesthesia Plan: MAC   Post-op Pain Management:    Induction: Intravenous  PONV Risk Score and Plan: 1 and Propofol infusion and Treatment may vary due to age or medical condition  Airway Management Planned: Nasal Cannula  Additional Equipment:   Intra-op Plan:   Post-operative Plan:   Informed Consent: I have reviewed the patients History and Physical, chart, labs and discussed the procedure including the risks, benefits and alternatives for the proposed anesthesia with the patient or authorized representative who has indicated his/her understanding and acceptance.     Dental advisory given  Plan Discussed with: CRNA, Anesthesiologist and Surgeon  Anesthesia Plan Comments:         Anesthesia Quick Evaluation

## 2021-06-19 NOTE — Transfer of Care (Signed)
Immediate Anesthesia Transfer of Care Note  Patient: Gregory Christensen  Procedure(s) Performed: ESOPHAGOGASTRODUODENOSCOPY (EGD) WITH PROPOFOL BIOPSY  Patient Location: PACU  Anesthesia Type:MAC  Level of Consciousness: drowsy  Airway & Oxygen Therapy: Patient Spontanous Breathing and Patient connected to nasal cannula oxygen  Post-op Assessment: Report given to RN and Post -op Vital signs reviewed and stable  Post vital signs: Reviewed and stable  Last Vitals:  Vitals Value Taken Time  BP 91/63 06/19/21 0916  Temp 36.7 C 06/19/21 0915  Pulse 57 06/19/21 0929  Resp 20 06/19/21 0929  SpO2 100 % 06/19/21 0929  Vitals shown include unvalidated device data.  Last Pain:  Vitals:   06/19/21 0915  TempSrc:   PainSc: 0-No pain      Patients Stated Pain Goal: 0 (50/15/86 8257)  Complications: No notable events documented.

## 2021-06-19 NOTE — Significant Event (Signed)
Rapid Response Event Note   Reason for Call :  Midsternal chest pain Unrelieved by SL NTG 12 lead EKG done  Initial Focused Assessment:  Patient is lying in the bed.  He is alert and oriented.  When asked where his pain is located he points to his epigastric/midsternal area. He also states he has pain in his bilateral scapular area.  Abdomen mildly tender, but soft and not distended.  Positive bowel sounds Lung sounds are clear.  Heart tones are regular  BP  126/71  SR 60s  RR 14 O2 sat 100% on 6L [place] Protonix gtt @ 8mg /hr  Interventions:  2mg  Morphine given IV LR @ 100cc/hr Assisted patient to sitting on the side of the bed.  He burped repeatedly.   He states he is feeling better.   Plan of Care:     Event Summary:   MD Notified: Adhikari notified by RN Call Time: [place] Time: 6073 End Time: 1800  Raliegh Ip, RN

## 2021-06-19 NOTE — Progress Notes (Signed)
°  Transition of Care [facility]) Screening Note   Patient Details  Name: Chamberlain Fouch Date of Birth: August 04, 1950   Transition of Care [facility]) CM/SW Contact:    Milas Gain, [place] Phone Number: 06/19/2021, 4:29 PM    Transition of Care Department [facility] has reviewed patient and no TOC needs have been identified at this time. We will continue to monitor patient advancement through interdisciplinary progression rounds. If new patient transition needs arise, please place a TOC consult.

## 2021-06-19 NOTE — Consult Note (Signed)
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  Of note, [facility] Care Management services does not replace or interfere with any services that are arranged by inpatient case management or social work.  For additional questions or referrals please contact:    Plan: Will sign off.    Natividad Brood, RN BSN [facility] Liaison  [phone] business mobile phone Toll free office [phone]  Fax number: [phone] [email or web address]

## 2021-06-19 NOTE — Anesthesia Postprocedure Evaluation (Signed)
Anesthesia Post Note  Patient: Gregory Christensen  Procedure(s) Performed: ESOPHAGOGASTRODUODENOSCOPY (EGD) WITH PROPOFOL BIOPSY     Patient location during evaluation: Endoscopy Anesthesia Type: MAC Level of consciousness: awake and alert Pain management: pain level controlled Vital Signs Assessment: post-procedure vital signs reviewed and stable Respiratory status: spontaneous breathing, nonlabored ventilation, respiratory function stable and patient connected to nasal cannula oxygen Cardiovascular status: stable and blood pressure returned to baseline Postop Assessment: no apparent nausea or vomiting Anesthetic complications: no   No notable events documented.  Last Vitals:  Vitals:   06/19/21 0930 06/19/21 0958  BP: 112/64 (!) 148/75  Pulse: (!) 59 (!) 54  Resp: 16 16  Temp: 36.7 C (!) 36.2 C  SpO2: 100% 100%    Last Pain:  Vitals:   06/19/21 0958  TempSrc: Oral  PainSc:                  [place] S

## 2021-06-19 NOTE — Progress Notes (Signed)
Nurse evaluated pt and he stated he felt much better.  Denies any pain at this time.  Will continue to monitor.

## 2021-06-19 NOTE — Interval H&P Note (Signed)
History and Physical Interval Note:  06/19/2021 8:49 AM  Gregory Christensen  has presented today for surgery, with the diagnosis of Melena, anemia.  The various methods of treatment have been discussed with the patient and family. After consideration of risks, benefits and other options for treatment, the patient has consented to  Procedure(s): ESOPHAGOGASTRODUODENOSCOPY (EGD) WITH PROPOFOL (N/A) as a surgical intervention.  The patient's history has been reviewed, patient examined, no change in status, stable for surgery.  I have reviewed the patient's chart and labs.  Questions were answered to the patient's satisfaction.     [name]

## 2021-06-19 NOTE — Brief Op Note (Signed)
EGD showed no active bleeding or blood products. Moderate antral gastritis - s/p biopsies. No source of GI bleeding seen on EGD. Will do capsule endoscopy tomorrow to allow time for passage of blood from small intestine from recent biopsies and try and avoid a false positive study. Clear liquid diet today. NPO p MN for small bowel capsule placement tomorrow morning.

## 2021-06-19 NOTE — Anesthesia Procedure Notes (Signed)
Procedure Name: MAC Date/Time: 06/19/2021 9:06 AM Performed by: Imagene Riches, CRNA Pre-anesthesia Checklist: Patient identified, Emergency Drugs available, Suction available, Patient being monitored and Timeout performed Patient Re-evaluated:Patient Re-evaluated prior to induction Oxygen Delivery Method: Nasal cannula

## 2021-06-19 NOTE — Progress Notes (Signed)
PROGRESS NOTE    Gregory Christensen  EPP:[id number] DOB: 1950/07/29 DOA: 06/17/2021 PCP: Vernie Shanks, MD   Chief Complain: Chest pain, fatigue, weakness  Brief Narrative: Patient is a 70 year old male with history of hypertension, coronary artery disease, CKD stage III, COPD on 2 L of oxygen at home, history of PE on Eliquis who presented with complaints of chest pain, fatigue.He mentioned that he noticed black tarry stools 3 weeks ago.On presentation, he was hemodynamically stable.  Hemoglobin found to be in the range of 5.8.  Troponins were negative, no ischemic changes in the EKG.  FOBT was positive.  2 units of blood transfusion was done .GI consulted and he underwent EGD today with finding of gastritis, no active bleeding.  Plan for video capsule study tomorrow.  Assessment & Plan:   Principal Problem:   Symptomatic anemia Active Problems:   Chronic anticoagulation   Heme positive stool   CKD stage 3a   COPD (chronic obstructive pulmonary disease) (HCC)   Essential hypertension   GI bleed   Acute blood loss anemia: Presented with weakness.  Report of passage of black tarry stool about 3 weeks ago.  Takes Eliquis for PE.  Hemoglobin was in the range of 5 on presentation.  Transfused units of PRBC.  Hemoglobin stable in the range of 8 today.  No new episode of bleeding like hematochezia or melena after admission. Underwent EGD today with finding of gastritis but no active bleeding, no source of bleeding.  Plan for video capsule endoscopy tomorrow. Continue Protonix drip  COPD: Currently stable.  On 2 L of oxygen at home.  Continue bronchodilators and supplemental oxygen as needed  Stage IIIb CKD: Currently kidney function stable.  Monitor  Hypertension: Currently blood pressure stable.  Continue current medications  Hyperlipidemia: On Lipitor at home  History of BPH: On Flomax  History of PE: On Eliquis           DVT prophylaxis:SCD Code Status: Full Family  Communication: None at bedside Patient status:  Dispo: The patient is from: Home              Anticipated d/c is to: Home              Anticipated d/c date is: tomorow  Consultants: GI  Procedures:EGD  Antimicrobials:  Anti-infectives (From admission, onward)    None       Subjective:  Patient seen and examined at the bedside this morning after endoscopy procedure.  Denies any complaints except for upper abdominal discomfort.  Hungry and wanted to eat food   Objective: Vitals:   06/19/21 0819 06/19/21 0915 06/19/21 0930 06/19/21 0958  BP: (!) 155/84 91/63 112/64 (!) 148/75  Pulse: (!) 54 72 (!) 59 (!) 54  Resp: 19 (!) 21 16 16   Temp: 98.4 F (36.9 C) 98.1 F (36.7 C) 98.1 F (36.7 C) (!) 97.2 F (36.2 C)  TempSrc: Oral   Oral  SpO2: 100% 100% 100% 100%  Weight:      Height:        Intake/Output Summary (Last 24 hours) at 06/19/2021 1306 Last data filed at 06/19/2021 6063 Gross per 24 hour  Intake 1753.06 ml  Output 600 ml  Net 1153.06 ml   Filed Weights   06/18/21 2107  Weight: 77.1 kg    Examination:  General exam: Overall comfortable, not in distress HEENT: PERRL Respiratory system:  no wheezes or crackles  Cardiovascular system: S1 & S2 heard, RRR.  Gastrointestinal system: Abdomen is  nondistended, soft and nontender. Central nervous system: Alert and oriented Extremities: No edema, no clubbing ,no cyanosis Skin: No rashes, no ulcers,no icterus      Data Reviewed: I have personally reviewed following labs and imaging studies  CBC: Recent Labs  Lab 06/17/21 2318 06/18/21 0038 06/18/21 0700 06/18/21 1808 06/19/21 0244  WBC 5.9  --  5.1  --  5.6  HGB 6.1* 5.8* 5.7* 8.3* 8.6*  HCT 20.8* 20.1* 19.2* 25.5* 27.1*  MCV 89.3  --  89.3  --  86.0  PLT 306  --  277  --  387   Basic Metabolic Panel: Recent Labs  Lab 06/17/21 2318 06/18/21 0700 06/19/21 0244  NA 137 137 136  K 4.0 4.0 4.0  CL 111 111 109  CO2 17* 20* 22  GLUCOSE 106* 94  96  BUN 17 17 15   CREATININE 1.65* 1.63* 1.66*  CALCIUM 8.7* 8.7* 8.4*   GFR: Estimated Creatinine Clearance: 45.2 mL/min (A) (by C-G formula based on SCr of 1.66 mg/dL (H)). Liver Function Tests: Recent Labs  Lab 06/17/21 2318  AST 21  ALT 18  ALKPHOS 65  BILITOT 0.5  PROT 6.7  ALBUMIN 3.5   No results for input(s): LIPASE, AMYLASE in the last 168 hours. No results for input(s): AMMONIA in the last 168 hours. Coagulation Profile: No results for input(s): INR, PROTIME in the last 168 hours. Cardiac Enzymes: No results for input(s): CKTOTAL, CKMB, CKMBINDEX, TROPONINI in the last 168 hours. BNP (last 3 results) No results for input(s): PROBNP in the last 8760 hours. HbA1C: No results for input(s): HGBA1C in the last 72 hours. CBG: Recent Labs  Lab 06/19/21 0917  GLUCAP 73   Lipid Profile: No results for input(s): CHOL, HDL, LDLCALC, TRIG, CHOLHDL, LDLDIRECT in the last 72 hours. Thyroid Function Tests: No results for input(s): TSH, T4TOTAL, FREET4, T3FREE, THYROIDAB in the last 72 hours. Anemia Panel: No results for input(s): VITAMINB12, FOLATE, FERRITIN, TIBC, IRON, RETICCTPCT in the last 72 hours. Sepsis Labs: No results for input(s): PROCALCITON, LATICACIDVEN in the last 168 hours.  Recent Results (from the past 240 hour(s))  Resp Panel by RT-PCR (Flu A&B, Covid) Nasopharyngeal Swab     Status: None   Collection Time: 06/18/21  3:53 AM   Specimen: Nasopharyngeal Swab; Nasopharyngeal(NP) swabs in vial transport medium  Result Value Ref Range Status   SARS Coronavirus 2 by RT PCR NEGATIVE NEGATIVE Final    Comment: (NOTE) SARS-CoV-2 target nucleic acids are NOT DETECTED.  The SARS-CoV-2 RNA is generally detectable in upper respiratory specimens during the acute phase of infection. The lowest concentration of SARS-CoV-2 viral copies this assay can detect is 138 copies/mL. A negative result does not preclude SARS-Cov-2 infection and should not be used as the sole  basis for treatment or other patient management decisions. A negative result may occur with  improper specimen collection/handling, submission of specimen other than nasopharyngeal swab, presence of viral mutation(s) within the areas targeted by this assay, and inadequate number of viral copies(<138 copies/mL). A negative result must be combined with clinical observations, patient history, and epidemiological information. The expected result is Negative.  Fact Sheet for Patients:  EntrepreneurPulse.com.au  Fact Sheet for Healthcare Providers:  IncredibleEmployment.be  This test is no t yet approved or cleared by the [place] FDA and  has been authorized for detection and/or diagnosis of SARS-CoV-2 by FDA under an Emergency Use Authorization (EUA). This EUA will remain  in effect (meaning this test can be used)  for the duration of the COVID-19 declaration under Section 564(b)(1) of the Act, 21 U.S.C.section 360bbb-3(b)(1), unless the authorization is terminated  or revoked sooner.       Influenza A by PCR NEGATIVE NEGATIVE Final   Influenza B by PCR NEGATIVE NEGATIVE Final    Comment: (NOTE) The Xpert Xpress SARS-CoV-2/FLU/RSV plus assay is intended as an aid in the diagnosis of influenza from Nasopharyngeal swab specimens and should not be used as a sole basis for treatment. Nasal washings and aspirates are unacceptable for Xpert Xpress SARS-CoV-2/FLU/RSV testing.  Fact Sheet for Patients: EntrepreneurPulse.com.au  Fact Sheet for Healthcare Providers: IncredibleEmployment.be  This test is not yet approved or cleared by the [place] FDA and has been authorized for detection and/or diagnosis of SARS-CoV-2 by FDA under an Emergency Use Authorization (EUA). This EUA will remain in effect (meaning this test can be used) for the duration of the COVID-19 declaration under Section 564(b)(1) of the Act,  21 U.S.C. section 360bbb-3(b)(1), unless the authorization is terminated or revoked.  Performed at [facility], [place] [address] [place], [place] [address]          Radiology Studies: DG Chest Portable 1 View  Result Date: 06/17/2021 CLINICAL DATA:  Chest pain. EXAM: PORTABLE CHEST 1 VIEW COMPARISON:  PA Lat 05/18/2021. FINDINGS: The lungs emphysematous with scattered linear scarring but no evidence of focal pneumonia. No pleural effusion is seen. Heart size and vasculature are normal except for calcifications in the aortic arch. Osteopenia and slight thoracic levoscoliosis. IMPRESSION: No evidence of acute chest disease.  Stable COPD chest. Electronically Signed   By: Telford Nab M.D.   On: 06/17/2021 23:51        Scheduled Meds:  sodium chloride   Intravenous Once   fluticasone  1 spray Each Nare Daily   fluticasone furoate-vilanterol  1 puff Inhalation Daily   [START ON 06/21/2021] pantoprazole  40 mg Intravenous Q12H   tamsulosin  0.4 mg Oral Daily   umeclidinium bromide  1 puff Inhalation Daily   Continuous Infusions:  sodium chloride Stopped (06/18/21 0346)   lactated ringers 75 mL/hr at 06/19/21 0314   pantoprazole 8 mg/hr (06/19/21 1003)     LOS: 1 day    Time spent: More than 50% of that time was spent in counseling and/or coordination of care.      Shelly Coss, MD [facility] P12/19/2022, 1:06 PM

## 2021-06-20 ENCOUNTER — Encounter (HOSPITAL_COMMUNITY): Payer: Self-pay | Admitting: Family Medicine

## 2021-06-20 ENCOUNTER — Encounter (HOSPITAL_COMMUNITY)
Admission: EM | Disposition: A | Discharge status: Home / Self Care | Payer: Self-pay | Source: Home / Self Care | Attending: Internal Medicine

## 2021-06-20 HISTORY — PX: GIVENS CAPSULE STUDY: SHX5432

## 2021-06-20 LAB — TYPE AND SCREEN
ABO/RH(D): B POS
Antibody Screen: POSITIVE
Unit division: 0
Unit division: 0

## 2021-06-20 LAB — BASIC METABOLIC PANEL
Anion gap: 7 (ref 5–15)
Anion gap: 5 (ref 5–15)
BUN: 12 mg/dL (ref 8–23)
BUN: 11 mg/dL (ref 8–23)
CO2: 21 mmol/L — ABNORMAL LOW (ref 22–32)
CO2: 23 mmol/L (ref 22–32)
Calcium: 8.5 mg/dL — ABNORMAL LOW (ref 8.9–10.3)
Calcium: 8.6 mg/dL — ABNORMAL LOW (ref 8.9–10.3)
Chloride: 109 mmol/L (ref 98–111)
Chloride: 110 mmol/L (ref 98–111)
Creatinine, Ser: 1.56 mg/dL — ABNORMAL HIGH (ref 0.61–1.24)
Creatinine, Ser: 1.66 mg/dL — ABNORMAL HIGH (ref 0.61–1.24)
GFR, Estimated: 47 mL/min — ABNORMAL LOW (ref >60)
GFR, Estimated: 44 mL/min — ABNORMAL LOW (ref >60)
Glucose, Bld: 96 mg/dL (ref 70–99)
Glucose, Bld: 93 mg/dL (ref 70–99)
Potassium: 3.8 mmol/L (ref 3.5–5.1)
Potassium: 4.2 mmol/L (ref 3.5–5.1)
Sodium: 137 mmol/L (ref 135–145)
Sodium: 138 mmol/L (ref 135–145)

## 2021-06-20 LAB — BPAM RBC
Blood Product Expiration Date: 202301112359
Blood Product Expiration Date: 202301112359
ISSUE DATE / TIME: 202212180919
ISSUE DATE / TIME: 202212181238
Unit Type and Rh: 5100
Unit Type and Rh: 5100

## 2021-06-20 LAB — TROPONIN I (HIGH SENSITIVITY)
Troponin I (High Sensitivity): 5 ng/L (ref <18)
Troponin I (High Sensitivity): 5 ng/L (ref <18)

## 2021-06-20 LAB — HEMOGLOBIN AND HEMATOCRIT, BLOOD
HCT: 26.3 % — ABNORMAL LOW (ref 39.0–52.0)
Hemoglobin: 8.5 g/dL — ABNORMAL LOW (ref 13.0–17.0)

## 2021-06-20 LAB — CBC
HCT: 25.8 % — ABNORMAL LOW (ref 39.0–52.0)
Hemoglobin: 8.3 g/dL — ABNORMAL LOW (ref 13.0–17.0)
MCH: 27.4 pg (ref 26.0–34.0)
MCHC: 32.2 g/dL (ref 30.0–36.0)
MCV: 85.1 fL (ref 80.0–100.0)
Platelets: 318 10*3/uL (ref 150–400)
RBC: 3.03 MIL/uL — ABNORMAL LOW (ref 4.22–5.81)
RDW: 13.6 % (ref 11.5–15.5)
WBC: 5.3 10*3/uL (ref 4.0–10.5)
nRBC: 0 % (ref 0.0–0.2)

## 2021-06-20 SURGERY — IMAGING PROCEDURE, GI TRACT, INTRALUMINAL, VIA CAPSULE
Anesthesia: LOCAL

## 2021-06-20 MED ORDER — ALUM & MAG HYDROXIDE-SIMETH 200-200-20 MG/5ML PO SUSP
30.0000 mL | ORAL | Status: DC | PRN
  Administered 2021-06-20 – 2021-06-21 (×4): 30 mL via ORAL
  Filled 2021-06-20 (×4): qty 30

## 2021-06-20 SURGICAL SUPPLY — 1 items: TOWEL COTTON PACK 4EA (MISCELLANEOUS) ×4 IMPLANT

## 2021-06-20 NOTE — Plan of Care (Signed)
°  Problem: Education: Goal: Knowledge of General Education information will improve Description: Including pain rating scale, medication(s)/side effects and non-pharmacologic comfort measures Outcome: Progressing   Problem: Clinical Measurements: Goal: Ability to maintain clinical measurements within normal limits will improve Outcome: Progressing   Problem: Clinical Measurements: Goal: Diagnostic test results will improve Outcome: Progressing   Problem: Clinical Measurements: Goal: Cardiovascular complication will be avoided Outcome: Progressing   Problem: Pain Managment: Goal: General experience of comfort will improve Outcome: Progressing   Problem: Safety: Goal: Ability to remain free from injury will improve Outcome: Progressing

## 2021-06-20 NOTE — Progress Notes (Signed)
PROGRESS NOTE    Gregory Christensen  NLZ:[id number] DOB: 19-May-1951 DOA: 06/17/2021 PCP: Vernie Shanks, MD   Chief Complain: Chest pain, fatigue, weakness  Brief Narrative: Patient is a 70 year old male with history of hypertension, coronary artery disease, CKD stage III, COPD on 2 L of oxygen at home, history of PE on Eliquis who presented with complaints of chest pain, fatigue.He mentioned that he noticed black tarry stools 3 weeks ago.On presentation, he was hemodynamically stable.  Hemoglobin found to be in the range of 5.8.  Troponins were negative, no ischemic changes in the EKG.  FOBT was positive.  2 units of blood transfusion was done .GI consulted and he underwent EGD on 06/19/21 with finding of gastritis, no active bleeding.  Undergoing for video capsule study today.  Assessment & Plan:   Principal Problem:   Symptomatic anemia Active Problems:   Chronic anticoagulation   Heme positive stool   CKD stage 3a   COPD (chronic obstructive pulmonary disease) (HCC)   Essential hypertension   GI bleed   Acute blood loss anemia: Presented with weakness.  Report of passage of black tarry stool about 3 weeks ago.  Takes Eliquis for PE.  Hemoglobin was in the range of 5 on presentation.  Transfused units of PRBC.  Hemoglobin stable in the range of 8 today.  No new episode of bleeding like hematochezia or melena after admission. Underwent EGD on 12/19  with finding of gastritis but no active bleeding, no source of bleeding.  Plan for video capsule endoscopy today. On  Protonix drip  Chest pain: Has intermittent chest pain.  Likely GI mediated like reflux or could be from costochondritis.  This morning he is chest pain-free.  Troponins negative.  EKG did not show any ischemic changes.  Continue to monitor  COPD: Currently stable.  On 2 L of oxygen at home.  Continue bronchodilators and supplemental oxygen as needed  Stage IIIb CKD: Currently kidney function stable.  Monitor.  Baseline  creatinine ranges from 1.6-1.8.  Hypertension: Currently blood pressure stable.  Continue current medications  Hyperlipidemia: On Lipitor at home  History of BPH: On Flomax  History of PE: On Eliquis, on hold           DVT prophylaxis:SCD Code Status: Full Family Communication: None at bedside Patient status:  Dispo: The patient is from: Home              Anticipated d/c is to: Home              Anticipated d/c date is: After video capsule endoscopy report  Consultants: GI  Procedures:EGD  Antimicrobials:  Anti-infectives (From admission, onward)    None       Subjective:  Patient seen and examined at the bedside this morning.  He had issues with chest pain yesterday night and this morning.  When I evaluated him he was chest pain-free.  Denies any new complaints.  Looks comfortable.  No report of hematochezia or melena  Objective: Vitals:   06/19/21 1747 06/19/21 1757 06/19/21 2002 06/20/21 0323  BP: 127/69 111/66 132/61 128/69  Pulse:   70 63  Resp:  15 17 18   Temp:   97.8 F (36.6 C) 97.8 F (36.6 C)  TempSrc:   Oral Oral  SpO2: 100% 100% 98% 98%  Weight:      Height:        Intake/Output Summary (Last 24 hours) at 06/20/2021 0751 Last data filed at 06/20/2021 0324 Gross per 24  hour  Intake 300 ml  Output 450 ml  Net -150 ml   Filed Weights   06/18/21 2107  Weight: 77.1 kg    Examination:  General exam: Overall comfortable, not in distress HEENT: PERRL Respiratory system:  no wheezes or crackles  Cardiovascular system: S1 & S2 heard, RRR.  Gastrointestinal system: Abdomen is nondistended, soft and nontender. Central nervous system: Alert and oriented Extremities: No edema, no clubbing ,no cyanosis Skin: No rashes, no ulcers,no icterus    Data Reviewed: I have personally reviewed following labs and imaging studies  CBC: Recent Labs  Lab 06/17/21 2318 06/18/21 0038 06/18/21 0700 06/18/21 1808 06/19/21 0244 06/20/21 0234  WBC  5.9  --  5.1  --  5.6 5.3  HGB 6.1* 5.8* 5.7* 8.3* 8.6* 8.3*  HCT 20.8* 20.1* 19.2* 25.5* 27.1* 25.8*  MCV 89.3  --  89.3  --  86.0 85.1  PLT 306  --  277  --  302 350   Basic Metabolic Panel: Recent Labs  Lab 06/17/21 2318 06/18/21 0700 06/19/21 0244 06/20/21 0234  NA 137 137 136 137  K 4.0 4.0 4.0 3.8  CL 111 111 109 109  CO2 17* 20* 22 21*  GLUCOSE 106* 94 96 96  BUN 17 17 15 12   CREATININE 1.65* 1.63* 1.66* 1.56*  CALCIUM 8.7* 8.7* 8.4* 8.5*   GFR: Estimated Creatinine Clearance: 48.1 mL/min (A) (by C-G formula based on SCr of 1.56 mg/dL (H)). Liver Function Tests: Recent Labs  Lab 06/17/21 2318  AST 21  ALT 18  ALKPHOS 65  BILITOT 0.5  PROT 6.7  ALBUMIN 3.5   No results for input(s): LIPASE, AMYLASE in the last 168 hours. No results for input(s): AMMONIA in the last 168 hours. Coagulation Profile: No results for input(s): INR, PROTIME in the last 168 hours. Cardiac Enzymes: No results for input(s): CKTOTAL, CKMB, CKMBINDEX, TROPONINI in the last 168 hours. BNP (last 3 results) No results for input(s): PROBNP in the last 8760 hours. HbA1C: No results for input(s): HGBA1C in the last 72 hours. CBG: Recent Labs  Lab 06/19/21 0917  GLUCAP 73   Lipid Profile: No results for input(s): CHOL, HDL, LDLCALC, TRIG, CHOLHDL, LDLDIRECT in the last 72 hours. Thyroid Function Tests: No results for input(s): TSH, T4TOTAL, FREET4, T3FREE, THYROIDAB in the last 72 hours. Anemia Panel: No results for input(s): VITAMINB12, FOLATE, FERRITIN, TIBC, IRON, RETICCTPCT in the last 72 hours. Sepsis Labs: No results for input(s): PROCALCITON, LATICACIDVEN in the last 168 hours.  Recent Results (from the past 240 hour(s))  Resp Panel by RT-PCR (Flu A&B, Covid) Nasopharyngeal Swab     Status: None   Collection Time: 06/18/21  3:53 AM   Specimen: Nasopharyngeal Swab; Nasopharyngeal(NP) swabs in vial transport medium  Result Value Ref Range Status   SARS Coronavirus 2 by RT PCR  NEGATIVE NEGATIVE Final    Comment: (NOTE) SARS-CoV-2 target nucleic acids are NOT DETECTED.  The SARS-CoV-2 RNA is generally detectable in upper respiratory specimens during the acute phase of infection. The lowest concentration of SARS-CoV-2 viral copies this assay can detect is 138 copies/mL. A negative result does not preclude SARS-Cov-2 infection and should not be used as the sole basis for treatment or other patient management decisions. A negative result may occur with  improper specimen collection/handling, submission of specimen other than nasopharyngeal swab, presence of viral mutation(s) within the areas targeted by this assay, and inadequate number of viral copies(<138 copies/mL). A negative result must be combined with  clinical observations, patient history, and epidemiological information. The expected result is Negative.  Fact Sheet for Patients:  EntrepreneurPulse.com.au  Fact Sheet for Healthcare Providers:  IncredibleEmployment.be  This test is no t yet approved or cleared by the [place] FDA and  has been authorized for detection and/or diagnosis of SARS-CoV-2 by FDA under an Emergency Use Authorization (EUA). This EUA will remain  in effect (meaning this test can be used) for the duration of the COVID-19 declaration under Section 564(b)(1) of the Act, 21 U.S.C.section 360bbb-3(b)(1), unless the authorization is terminated  or revoked sooner.       Influenza A by PCR NEGATIVE NEGATIVE Final   Influenza B by PCR NEGATIVE NEGATIVE Final    Comment: (NOTE) The Xpert Xpress SARS-CoV-2/FLU/RSV plus assay is intended as an aid in the diagnosis of influenza from Nasopharyngeal swab specimens and should not be used as a sole basis for treatment. Nasal washings and aspirates are unacceptable for Xpert Xpress SARS-CoV-2/FLU/RSV testing.  Fact Sheet for Patients: EntrepreneurPulse.com.au  Fact Sheet for  Healthcare Providers: IncredibleEmployment.be  This test is not yet approved or cleared by the [place] FDA and has been authorized for detection and/or diagnosis of SARS-CoV-2 by FDA under an Emergency Use Authorization (EUA). This EUA will remain in effect (meaning this test can be used) for the duration of the COVID-19 declaration under Section 564(b)(1) of the Act, 21 U.S.C. section 360bbb-3(b)(1), unless the authorization is terminated or revoked.  Performed at [facility], [place] [address] [place], [place] [address]          Radiology Studies: No results found.      Scheduled Meds:  sodium chloride   Intravenous Once   fluticasone  1 spray Each Nare Daily   fluticasone furoate-vilanterol  1 puff Inhalation Daily   [START ON 06/21/2021] pantoprazole  40 mg Intravenous Q12H   tamsulosin  0.4 mg Oral Daily   umeclidinium bromide  1 puff Inhalation Daily   Continuous Infusions:  sodium chloride Stopped (06/18/21 0346)   lactated ringers 100 mL/hr at 06/20/21 0324   pantoprazole 8 mg/hr (06/20/21 0705)     LOS: 2 days    Time spent: 25 mins.More than 50% of that time was spent in counseling and/or coordination of care.      Shelly Coss, MD [facility] P12/20/2022, 7:51 AM

## 2021-06-20 NOTE — Progress Notes (Signed)
[facility] Progress Note  Gregory Christensen 70 y.o. 03/04/1951   Subjective: Feels good. Denies chest pain now (overnight events noted). Capsule placed this morning.  Objective: Vital signs: Vitals:   06/19/21 2002 06/20/21 0323  BP: 132/61 128/69  Pulse: 70 63  Resp: 17 18  Temp: 97.8 F (36.6 C) 97.8 F (36.6 C)  SpO2: 98% 98%    Physical Exam: Gen: lethargic, no acute distress, elderly HEENT: anicteric sclera CV: RRR Chest: CTA B Abd: soft, nontender, nondistended, +BS Ext: no edema  Lab Results: Recent Labs    06/20/21 0234 06/20/21 0606  NA 137 138  K 3.8 4.2  CL 109 110  CO2 21* 23  GLUCOSE 96 93  BUN 12 11  CREATININE 1.56* 1.66*  CALCIUM 8.5* 8.6*   Recent Labs    06/17/21 2318  AST 21  ALT 18  ALKPHOS 65  BILITOT 0.5  PROT 6.7  ALBUMIN 3.5   Recent Labs    06/19/21 0244 06/20/21 0234 06/20/21 0606  WBC 5.6 5.3  --   HGB 8.6* 8.3* 8.5*  HCT 27.1* 25.8* 26.3*  MCV 86.0 85.1  --   PLT 302 318  --       Assessment/Plan: Obscure overt GI bleed - EGD unrevealing for source. Hgb stable at 8.5. Capsule placed today and results will be ready tomorrow afternoon. Continue supportive care. Will f/u after capsule study results completed.    [name] 06/20/2021, 10:17 AM  Questions please call [phone] Patient ID: Gregory Christensen, male   DOB: [birth year], 70 y.o.   MRN: [id number]

## 2021-06-20 NOTE — Progress Notes (Signed)
Patient called nurse at bedside complaining of 7/10 chest pain. Increased oxygen from 4 to 6 liters. Vital signs are within normal limits. Gave nitroglycerin SL twice and pain is still there. Administered IV morphine. Patient had the same episode yesterday afternoon. Will notify MD and will continue to monitor.

## 2021-06-20 NOTE — Progress Notes (Signed)
Mobility Specialist Progress Note   06/20/21 1545  Mobility  Activity Ambulated in hall  Level of Assistance Standby assist, set-up cues, supervision of patient - no hands on  Assistive Device None (IV Pole)  Distance Ambulated (ft) 62 ft  Mobility Ambulated with assistance in hallway  Mobility Response Tolerated well  Mobility performed by Mobility specialist  $Mobility charge 1 Mobility   Received pt EOB requiring mod encouragement but agreeable. Mod I for bed mobility < > STS but needing cues for line management. X1 seated break d/t rapid SOB w/ a desat of 88% Sp02 but able to regain 92% sat w/ pursed lip breathing for ~58mins. Returned back to EOB w/ call bell in reach.    Pre Mobility: 74 HR, 95% SpO2 During Mobility: 89 HR, 88% SpO2 Post Mobility: 74 HR, 100% SpO2  Holland Falling Mobility Specialist Phone Number [phone]

## 2021-06-21 ENCOUNTER — Encounter (HOSPITAL_COMMUNITY): Payer: Self-pay | Admitting: Gastroenterology

## 2021-06-21 LAB — CBC WITH DIFFERENTIAL/PLATELET
Abs Immature Granulocytes: 0.01 10*3/uL (ref 0.00–0.07)
Basophils Absolute: 0 10*3/uL (ref 0.0–0.1)
Basophils Relative: 0 %
Eosinophils Absolute: 0.1 10*3/uL (ref 0.0–0.5)
Eosinophils Relative: 2 %
HCT: 25.5 % — ABNORMAL LOW (ref 39.0–52.0)
Hemoglobin: 8.1 g/dL — ABNORMAL LOW (ref 13.0–17.0)
Immature Granulocytes: 0 %
Lymphocytes Relative: 22 %
Lymphs Abs: 1.1 10*3/uL (ref 0.7–4.0)
MCH: 27.2 pg (ref 26.0–34.0)
MCHC: 31.8 g/dL (ref 30.0–36.0)
MCV: 85.6 fL (ref 80.0–100.0)
Monocytes Absolute: 0.6 10*3/uL (ref 0.1–1.0)
Monocytes Relative: 12 %
Neutro Abs: 3.2 10*3/uL (ref 1.7–7.7)
Neutrophils Relative %: 64 %
Platelets: 298 10*3/uL (ref 150–400)
RBC: 2.98 MIL/uL — ABNORMAL LOW (ref 4.22–5.81)
RDW: 13.7 % (ref 11.5–15.5)
WBC: 5.1 10*3/uL (ref 4.0–10.5)
nRBC: 0 % (ref 0.0–0.2)

## 2021-06-21 MED ORDER — PANTOPRAZOLE SODIUM 40 MG PO TBEC: 40.0000 mg | DELAYED_RELEASE_TABLET | Freq: Every day | ORAL | 0 refills | Status: DC

## 2021-06-21 MED ORDER — FERROUS SULFATE 325 (65 FE) MG PO TABS: 325.0000 mg | ORAL_TABLET | Freq: Every day | ORAL | 1 refills | Status: DC

## 2021-06-21 NOTE — Progress Notes (Signed)
Discharge instructions given to and reviewed with patient. Patient verbalized understanding of all discharge instructions including to call and make a follow up appointment with his PCP and the GI doctor. Patient verbalized understanding of changes to home med list including new prescriptions.

## 2021-06-21 NOTE — Progress Notes (Signed)
Patient left unit by wheelchair, A&Ox4, on 2L o2 with no distress noted and Diamond, NT at side. Patient discharging home with his daughter driving.

## 2021-06-21 NOTE — Progress Notes (Signed)
Gregory Christensen  Gregory Christensen 70 y.o. Jul 26, 1950   Subjective: Sleeping soundly. Denies abdominal pain.  Objective: Vital signs: Vitals:   06/21/21 0803 06/21/21 1217  BP: 132/69 131/73  Pulse:  82  Resp:  18  Temp:  (!) 97.4 F (36.3 C)  SpO2: 100% 100%    Physical Exam: Gen: alert, no acute distress  HEENT: anicteric sclera Psych: normal affect, mood  Lab Results: Recent Labs    06/20/21 0234 06/20/21 0606  NA 137 138  K 3.8 4.2  CL 109 110  CO2 21* 23  GLUCOSE 96 93  BUN 12 11  CREATININE 1.56* 1.66*  CALCIUM 8.5* 8.6*   No results for input(s): AST, ALT, ALKPHOS, BILITOT, PROT, ALBUMIN in the last 72 hours. 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 See procedure Christensen for details (will be scanned in). No active bleeding. Ok to resume Eliquis. Continue PPI QD. Iron supplementation if anemia worsens or persists. May need Hematology evaluation as well if anemia persists. F/U with me in 6-8 weeks. Ok to go home from GI standpoint. D/W Dr. Tawanna Solo.   Gregory Christensen 06/21/2021, 4:28 PM  Questions please call [phone] Patient ID: Gregory Christensen, male   DOB: Jan 20, 1951, 70 y.o.   MRN: [id number]

## 2021-06-21 NOTE — Discharge Summary (Signed)
Physician Discharge Summary  Gregory Christensen CWC:[id number] DOB: January 23, 1951 DOA: 06/17/2021  PCP: Vernie Shanks, MD  Admit date: 06/17/2021 Discharge date: 06/21/2021  Admitted From: Home Disposition:  Home  Discharge Condition:Stable CODE STATUS:FULL Diet recommendation: Heart Healthy   Brief/Interim Summary:  Patient is a 70 year old male with history of hypertension, coronary artery disease, CKD stage III, COPD on 2 L of oxygen at home, history of PE on Eliquis who presented with complaints of chest pain, fatigue.He mentioned that he noticed black tarry stools 3 weeks ago.On presentation, he was hemodynamically stable.  Hemoglobin found to be in the range of 5.8.  Troponins were negative, no ischemic changes in the EKG.  FOBT was positive.  2 units of blood transfusion was done .GI consulted and he underwent EGD on 06/19/21 with finding of gastritis, no active bleeding.  Underwent video capsule study which showed small AVBs in small bowel without finding of any active bleeding.Patient is medically stable for dc today.He will follow up with GI as outpatient.  Following problems were addressed during her  hospitalization:  Acute blood loss anemia: Presented with weakness.  Report of passage of black tarry stool about 3 weeks ago.  Takes Eliquis for PE.  Hemoglobin was in the range of 5 on presentation.  Transfused 2 units of PRBC.  Hemoglobin has remained stable in the range of 8.  No new episode of bleeding like hematochezia or melena after admission. Underwent EGD on 12/19  with finding of gastritis but no active bleeding, no source of bleeding seen. Underwent video capsule endoscopy  wh=ich showed small bowel AVMs without finding of active bleeding.He was started on protonix drip,now changed to oral   Chest pain: Has intermittent chest pain.  Likely GI mediated like reflux or could be from costochondritis.  This morning he is chest pain-free.  Troponins negative.  EKG did not show any  ischemic changes.  Continue to monitor   COPD: Currently stable.  On 2 L of oxygen at home.  Continue bronchodilators and supplemental oxygen as needed   Stage IIIb CKD: Currently kidney function stable.  Monitor.  Baseline creatinine ranges from 1.6-1.8.   Hypertension: Currently blood pressure stable.  Continue current medications   Hyperlipidemia: On Lipitor at home   History of BPH: On Flomax   History of PE: On Eliquis, was on  hold    Discharge Diagnoses:  Principal Problem:   Symptomatic anemia Active Problems:   Chronic anticoagulation   Heme positive stool   CKD stage 3a   COPD (chronic obstructive pulmonary disease) (HCC)   Essential hypertension   GI bleed    Discharge Instructions  Discharge Instructions     Diet - low sodium heart healthy   Complete by: As directed    Discharge instructions   Complete by: As directed    1)Please take prescribed medications as instructed 2)Follow up with your PCP in a week. Do a CBC and BMP tests during the follow up 3)Follow up gastroenterology in 6-8 weeks.Name and number of the provider has been attached   Increase activity slowly   Complete by: As directed       Allergies as of 06/21/2021   No Known Allergies      Medication List     STOP taking these medications    diltiazem 30 MG tablet Commonly known as: Cardizem   mupirocin ointment 2 % Commonly known as: BACTROBAN   predniSONE 10 MG (21) Tbpk tablet Commonly known as: STERAPRED UNI-PAK 21 TAB  TAKE these medications    albuterol (2.5 MG/3ML) 0.083% nebulizer solution Commonly known as: PROVENTIL Take 3 mLs (2.5 mg total) by nebulization every 6 (six) hours as needed for wheezing or shortness of breath.   albuterol 108 (90 Base) MCG/ACT inhaler Commonly known as: VENTOLIN HFA Inhale 2 puffs into the lungs every 2 (two) hours as needed for wheezing or shortness of breath.   amLODipine 2.5 MG tablet Commonly known as: NORVASC Take  2.5 mg by mouth daily.   apixaban 5 MG Tabs tablet Commonly known as: Eliquis Take 1 tablet (5 mg total) by mouth 2 (two) times daily.   Artificial Tears 0.1-0.3 % Soln Generic drug: Dextran 70-Hypromellose Place 1 drop into both eyes daily as needed (dry eyes).   ascorbic acid 500 MG tablet Commonly known as: VITAMIN C in the morning and at bedtime.   atorvastatin 80 MG tablet Commonly known as: LIPITOR Take 1 tablet (80 mg total) by mouth daily.   diclofenac Sodium 1 % Gel Commonly known as: VOLTAREN Apply 2 g topically 2 (two) times daily as needed (Pain).   docusate sodium 100 MG capsule Commonly known as: COLACE Take 100 mg by mouth daily as needed for mild constipation.   ferrous sulfate 325 (65 FE) MG tablet Take 1 tablet (325 mg total) by mouth daily.   fluticasone 50 MCG/ACT nasal spray Commonly known as: FLONASE Place into both nostrils as needed for allergies or rhinitis.   fluticasone-salmeterol 250-50 MCG/ACT Aepb Commonly known as: ADVAIR Inhale 1 puff into the lungs in the morning and at bedtime.   gabapentin 300 MG capsule Commonly known as: NEURONTIN Take 300 mg by mouth 3 (three) times daily.   Hibiclens 4 % external liquid Generic drug: chlorhexidine See admin instructions.   levocetirizine 5 MG tablet Commonly known as: XYZAL Take 5 mg by mouth daily.   lidocaine 5 % Commonly known as: LIDODERM Place 1 patch onto the skin every 12 (twelve) hours as needed (pain).   methenamine 1 g tablet Commonly known as: HIPREX in the morning and at bedtime.   montelukast 10 MG tablet Commonly known as: SINGULAIR Take 10 mg by mouth at bedtime.   nitroGLYCERIN 0.4 MG SL tablet Commonly known as: NITROSTAT Take up to 3 tablets as needed for chest pain.   pantoprazole 40 MG tablet Commonly known as: Protonix Take 1 tablet (40 mg total) by mouth daily.   sildenafil 100 MG tablet Commonly known as: VIAGRA Take 100 mg by mouth daily as needed for  erectile dysfunction.   Spiriva Respimat 2.5 MCG/ACT Aers Generic drug: Tiotropium Bromide Monohydrate Inhale 2 puffs into the lungs daily. What changed: Another medication with the same name was removed. Continue taking this medication, and follow the directions you see here.   tamsulosin 0.4 MG Caps capsule Commonly known as: FLOMAX Take 0.4 mg by mouth daily.        Follow-up Information     Vernie Shanks, MD. Schedule an appointment as soon as possible for a visit in 1 week(s).   Specialty: Family Medicine Contact information: [place] [address] [phone]                No Known Allergies  Consultations: GI   Procedures/Studies: DG Chest Portable 1 View  Result Date: 06/17/2021 CLINICAL DATA:  Chest pain. EXAM: PORTABLE CHEST 1 VIEW COMPARISON:  PA Lat 05/18/2021. FINDINGS: The lungs emphysematous with scattered linear scarring but no evidence of focal pneumonia.  No pleural effusion is seen. Heart size and vasculature are normal except for calcifications in the aortic arch. Osteopenia and slight thoracic levoscoliosis. IMPRESSION: No evidence of acute chest disease.  Stable COPD chest. Electronically Signed   By: Telford Nab M.D.   On: 06/17/2021 23:51      Subjective: Patient seen and examined the bedside this morning.  Hemodynamically stable for discharge today  Discharge Exam: Vitals:   06/21/21 0803 06/21/21 1217  BP: 132/69 131/73  Pulse:  82  Resp:  18  Temp:  (!) 97.4 F (36.3 C)  SpO2: 100% 100%   Vitals:   06/20/21 2153 06/21/21 0354 06/21/21 0803 06/21/21 1217  BP: (!) 105/59 (!) 142/62 132/69 131/73  Pulse: 73 78  82  Resp: 18 19  18   Temp: 98 F (36.7 C) 97.8 F (36.6 C)  (!) 97.4 F (36.3 C)  TempSrc: Oral Oral  Oral  SpO2: 100%  100% 100%  Weight:      Height:        General: Pt is alert, awake, not in acute distress Cardiovascular: RRR, S1/S2 +, no rubs, no gallops Respiratory: CTA bilaterally,  no wheezing, no rhonchi Abdominal: Soft, NT, ND, bowel sounds + Extremities: no edema, no cyanosis    The results of significant diagnostics from this hospitalization (including imaging, microbiology, ancillary and laboratory) are listed below for reference.     Microbiology: Recent Results (from the past 240 hour(s))  Resp Panel by RT-PCR (Flu A&B, Covid) Nasopharyngeal Swab     Status: None   Collection Time: 06/18/21  3:53 AM   Specimen: Nasopharyngeal Swab; Nasopharyngeal(NP) swabs in vial transport medium  Result Value Ref Range Status   SARS Coronavirus 2 by RT PCR NEGATIVE NEGATIVE Final    Comment: (NOTE) SARS-CoV-2 target nucleic acids are NOT DETECTED.  The SARS-CoV-2 RNA is generally detectable in upper respiratory specimens during the acute phase of infection. The lowest concentration of SARS-CoV-2 viral copies this assay can detect is 138 copies/mL. A negative result does not preclude SARS-Cov-2 infection and should not be used as the sole basis for treatment or other patient management decisions. A negative result may occur with  improper specimen collection/handling, submission of specimen other than nasopharyngeal swab, presence of viral mutation(s) within the areas targeted by this assay, and inadequate number of viral copies(<138 copies/mL). A negative result must be combined with clinical observations, patient history, and epidemiological information. The expected result is Negative.  Fact Sheet for Patients:  EntrepreneurPulse.com.au  Fact Sheet for Healthcare Providers:  IncredibleEmployment.be  This test is no t yet approved or cleared by the [place] FDA and  has been authorized for detection and/or diagnosis of SARS-CoV-2 by FDA under an Emergency Use Authorization (EUA). This EUA will remain  in effect (meaning this test can be used) for the duration of the COVID-19 declaration under Section 564(b)(1) of the  Act, 21 U.S.C.section 360bbb-3(b)(1), unless the authorization is terminated  or revoked sooner.       Influenza A by PCR NEGATIVE NEGATIVE Final   Influenza B by PCR NEGATIVE NEGATIVE Final    Comment: (NOTE) The Xpert Xpress SARS-CoV-2/FLU/RSV plus assay is intended as an aid in the diagnosis of influenza from Nasopharyngeal swab specimens and should not be used as a sole basis for treatment. Nasal washings and aspirates are unacceptable for Xpert Xpress SARS-CoV-2/FLU/RSV testing.  Fact Sheet for Patients: EntrepreneurPulse.com.au  Fact Sheet for Healthcare Providers: IncredibleEmployment.be  This test is not yet approved or  cleared by the [place] and has been authorized for detection and/or diagnosis of SARS-CoV-2 by FDA under an Emergency Use Authorization (EUA). This EUA will remain in effect (meaning this test can be used) for the duration of the COVID-19 declaration under Section 564(b)(1) of the Act, 21 U.S.C. section 360bbb-3(b)(1), unless the authorization is terminated or revoked.  Performed at [name], [place] [address] [place], [place] [address]      Labs: BNP (last 3 results) No results for input(s): BNP in the last 8760 hours. Basic Metabolic Panel: Recent Labs  Lab 06/17/21 2318 06/18/21 0700 06/19/21 0244 06/20/21 0234 06/20/21 0606  NA 137 137 136 137 138  K 4.0 4.0 4.0 3.8 4.2  CL 111 111 109 109 110  CO2 17* 20* 22 21* 23  GLUCOSE 106* 94 96 96 93  BUN 17 17 15 12 11   CREATININE 1.65* 1.63* 1.66* 1.56* 1.66*  CALCIUM 8.7* 8.7* 8.4* 8.5* 8.6*   Liver Function Tests: Recent Labs  Lab 06/17/21 2318  AST 21  ALT 18  ALKPHOS 65  BILITOT 0.5  PROT 6.7  ALBUMIN 3.5   No results for input(s): LIPASE, AMYLASE in the last 168 hours. No results for input(s): AMMONIA in the last 168 hours. CBC: Recent Labs  Lab 06/17/21 2318 06/18/21 0038 06/18/21 0700 06/18/21 1808 06/19/21 0244  06/20/21 0234 06/20/21 0606 06/21/21 0044  WBC 5.9  --  5.1  --  5.6 5.3  --  5.1  NEUTROABS  --   --   --   --   --   --   --  3.2  HGB 6.1*   < > 5.7* 8.3* 8.6* 8.3* 8.5* 8.1*  HCT 20.8*   < > 19.2* 25.5* 27.1* 25.8* 26.3* 25.5*  MCV 89.3  --  89.3  --  86.0 85.1  --  85.6  PLT 306  --  277  --  302 318  --  298   < > = values in this interval not displayed.   Cardiac Enzymes: No results for input(s): CKTOTAL, CKMB, CKMBINDEX, TROPONINI in the last 168 hours. BNP: Invalid input(s): POCBNP CBG: Recent Labs  Lab 06/19/21 0917  GLUCAP 73   D-Dimer No results for input(s): DDIMER in the last 72 hours. Hgb A1c No results for input(s): HGBA1C in the last 72 hours. Lipid Profile No results for input(s): CHOL, HDL, LDLCALC, TRIG, CHOLHDL, LDLDIRECT in the last 72 hours. Thyroid function studies No results for input(s): TSH, T4TOTAL, T3FREE, THYROIDAB in the last 72 hours.  Invalid input(s): FREET3 Anemia work up No results for input(s): VITAMINB12, FOLATE, FERRITIN, TIBC, IRON, RETICCTPCT in the last 72 hours. Urinalysis    Component Value Date/Time   COLORURINE YELLOW 12/20/2020 1135   APPEARANCEUR CLEAR 12/20/2020 1135   LABSPEC 1.013 12/20/2020 1135   PHURINE 6.0 12/20/2020 1135   GLUCOSEU NEGATIVE 12/20/2020 1135   HGBUR SMALL (A) 12/20/2020 1135   BILIRUBINUR NEGATIVE 12/20/2020 1135   KETONESUR NEGATIVE 12/20/2020 1135   PROTEINUR NEGATIVE 12/20/2020 1135   NITRITE NEGATIVE 12/20/2020 1135   LEUKOCYTESUR TRACE (A) 12/20/2020 1135   Sepsis Labs Invalid input(s): PROCALCITONIN,  WBC,  LACTICIDVEN Microbiology Recent Results (from the past 240 hour(s))  Resp Panel by RT-PCR (Flu A&B, Covid) Nasopharyngeal Swab     Status: None   Collection Time: 06/18/21  3:53 AM   Specimen: Nasopharyngeal Swab; Nasopharyngeal(NP) swabs in vial transport medium  Result Value Ref Range Status   SARS Coronavirus  2 by RT PCR NEGATIVE NEGATIVE Final    Comment: (NOTE) SARS-CoV-2  target nucleic acids are NOT DETECTED.  The SARS-CoV-2 RNA is generally detectable in upper respiratory specimens during the acute phase of infection. The lowest concentration of SARS-CoV-2 viral copies this assay can detect is 138 copies/mL. A negative result does not preclude SARS-Cov-2 infection and should not be used as the sole basis for treatment or other patient management decisions. A negative result may occur with  improper specimen collection/handling, submission of specimen other than nasopharyngeal swab, presence of viral mutation(s) within the areas targeted by this assay, and inadequate number of viral copies(<138 copies/mL). A negative result must be combined with clinical observations, patient history, and epidemiological information. The expected result is Negative.  Fact Sheet for Patients:  EntrepreneurPulse.com.au  Fact Sheet for Healthcare Providers:  IncredibleEmployment.be  This test is no t yet approved or cleared by the [place] FDA and  has been authorized for detection and/or diagnosis of SARS-CoV-2 by FDA under an Emergency Use Authorization (EUA). This EUA will remain  in effect (meaning this test can be used) for the duration of the COVID-19 declaration under Section 564(b)(1) of the Act, 21 U.S.C.section 360bbb-3(b)(1), unless the authorization is terminated  or revoked sooner.       Influenza A by PCR NEGATIVE NEGATIVE Final   Influenza B by PCR NEGATIVE NEGATIVE Final    Comment: (NOTE) The Xpert Xpress SARS-CoV-2/FLU/RSV plus assay is intended as an aid in the diagnosis of influenza from Nasopharyngeal swab specimens and should not be used as a sole basis for treatment. Nasal washings and aspirates are unacceptable for Xpert Xpress SARS-CoV-2/FLU/RSV testing.  Fact Sheet for Patients: EntrepreneurPulse.com.au  Fact Sheet for Healthcare  Providers: IncredibleEmployment.be  This test is not yet approved or cleared by the [place] FDA and has been authorized for detection and/or diagnosis of SARS-CoV-2 by FDA under an Emergency Use Authorization (EUA). This EUA will remain in effect (meaning this test can be used) for the duration of the COVID-19 declaration under Section 564(b)(1) of the Act, 21 U.S.C. section 360bbb-3(b)(1), unless the authorization is terminated or revoked.  Performed at [facility], [place] [address], [place], [place] [address]     Please note: You were cared for by a hospitalist during your hospital stay. Once you are discharged, your primary care physician will handle any further medical issues. Please note that NO REFILLS for any discharge medications will be authorized once you are discharged, as it is imperative that you return to your primary care physician (or establish a relationship with a primary care physician if you do not have one) for your post hospital discharge needs so that they can reassess your need for medications and monitor your lab values.    Time coordinating discharge: 40 minutes  SIGNED:   Shelly Coss, MD  [facility] 06/21/2021, 4:38 PM Pager [phone]  If 7PM-7AM, please contact night-coverage [email or web address] Password [email or web address]

## 2021-06-21 NOTE — TOC Initial Note (Addendum)
Transition of Care [facility]) - Initial/Assessment Note    Patient Details  Name: Gregory Christensen MRN: [id number] Date of Birth: 1951-02-21  Transition of Care [facility]) CM/SW Contact:    Bethena Roys, RN Phone Number: 06/21/2021, 3:37 PM  Clinical Narrative:  Risk for readmission assessment completed. Prior to arrival patient was from home with his mother. Patient states he has durable medical equipment cane, and oxygen via [employer] @ 2 Liters. Patient states he has a portable tank for travel home. Patient states his ex-wife and daughter takes him to his appointments and that he gets his medications without any issues. Patient states his daughter will assist with transportation home once he is discharged. Patient states he is independent at this time. No home health needs identified at this time.  Case Manager will continue to follow for additional needs.                 Expected Discharge Plan: Home/Self Care Barriers to Discharge: No Barriers Identified   Patient Goals and CMS Choice Patient states their goals for this hospitalization and ongoing recovery are:: to return home with family support.   Choice offered to / list presented to : NA  Expected Discharge Plan and Services Expected Discharge Plan: Home/Self Care In-house Referral: NA Discharge Planning Services: CM Consult Post Acute Care Choice: NA Living arrangements for the past 2 months: Single Family Home                 DME Arranged: N/A DME Agency: NA       HH Arranged: NA          Prior Living Arrangements/Services Living arrangements for the past 2 months: Single Family Home Lives with:: Parents Patient language and need for interpreter reviewed:: Yes        Need for Family Participation in Patient Care: No (Comment) Care giver support system in place?: No (comment) Current home services: DME (Patient has oxygen via [employer] and cane at home.) Criminal Activity/Legal Involvement Pertinent to Current  Situation/Hospitalization: No - Comment as needed  Activities of Daily Living Home Assistive Devices/Equipment: None ADL Screening (condition at time of admission) Patient's cognitive ability adequate to safely complete daily activities?: Yes Is the patient deaf or have difficulty hearing?: No Does the patient have difficulty seeing, even when wearing glasses/contacts?: No Does the patient have difficulty concentrating, remembering, or making decisions?: No Patient able to express need for assistance with ADLs?: No Does the patient have difficulty dressing or bathing?: No Independently performs ADLs?: Yes (appropriate for developmental age) Does the patient have difficulty walking or climbing stairs?: Yes Weakness of Legs: None Weakness of Arms/Hands: None             Emotional Assessment Appearance:: Appears stated age Attitude/Demeanor/Rapport: Engaged Affect (typically observed): Appropriate Orientation: : Oriented to Self, Oriented to Place, Oriented to  Time, Oriented to Situation Alcohol / Substance Use: Not Applicable Psych Involvement: No (comment)  Admission diagnosis:  Atypical chest pain [R07.89] Symptomatic anemia [D64.9] Patient Active Problem List   Diagnosis Date Noted   Essential hypertension 06/18/2021   GI bleed 06/18/2021   Precordial pain 05/12/2021   COPD with acute exacerbation ([place]) 03/19/2021   Benign prostatic hyperplasia with lower urinary tract symptoms 03/19/2021   Elevated prostate specific antigen (PSA) 03/19/2021   Type 2 diabetes mellitus ([place]) 03/19/2021   Obstructive sleep apnea 03/19/2021   Hyperlipidemia 03/19/2021   Allergic rhinitis 03/19/2021   CKD stage 3a 03/19/2021   Grade I  diastolic dysfunction 68/25/7493   COPD (chronic obstructive pulmonary disease) ([place]) 03/19/2021   Benign neoplasm of transverse colon    Benign neoplasm of descending colon    Benign neoplasm of rectum    Heme positive stool    Iron deficiency anemia due  to chronic blood loss    Melena    Abdominal pain, chronic, right lower quadrant    Chronic anticoagulation    Symptomatic anemia 09/24/2019   Pneumonia due to severe acute respiratory syndrome coronavirus 2 (SARS-CoV-2) 02/28/2019   Renal mass 10/31/2018   Chest pressure 10/15/2018   Coronary artery calcification 10/15/2018   Respiratory failure, acute-on-chronic ([place]) 08/03/2018   Acute deep vein thrombosis (DVT) of left lower extremity ([place])    Acute hypoxemic respiratory failure ([place])    Pulmonary embolism ([place]) 05/31/2018   COPD exacerbation ([place]) 10/18/2017   Hypoxia    Respiratory distress    Hypertension associated with diabetes ([place])    PCP:  Vernie Shanks, MD Pharmacy:   [facility], [address] [place] [address] Phone: [phone] Fax: [phone]     Social Determinants of Health (SDOH) Interventions    Readmission Risk Interventions Readmission Risk Prevention Plan 06/21/2021  Transportation Screening Complete  PCP or Specialist Appt within 3-5 Days Complete  HRI or [place] Complete  Social Work Consult for [place] Planning/Counseling Complete  Palliative Care Screening Not Applicable  Medication Review Press photographer) Complete  Some recent data might be hidden

## 2021-06-22 LAB — SURGICAL PATHOLOGY

## 2021-06-28 DIAGNOSIS — D62 Acute posthemorrhagic anemia: Secondary | ICD-10-CM | POA: Diagnosis not present

## 2021-06-28 DIAGNOSIS — D6869 Other thrombophilia: Secondary | ICD-10-CM | POA: Diagnosis not present

## 2021-06-28 DIAGNOSIS — D649 Anemia, unspecified: Secondary | ICD-10-CM | POA: Diagnosis not present

## 2021-06-28 DIAGNOSIS — N1832 Chronic kidney disease, stage 3b: Secondary | ICD-10-CM | POA: Diagnosis not present

## 2021-06-28 DIAGNOSIS — I2510 Atherosclerotic heart disease of native coronary artery without angina pectoris: Secondary | ICD-10-CM | POA: Diagnosis not present

## 2021-06-28 DIAGNOSIS — J449 Chronic obstructive pulmonary disease, unspecified: Secondary | ICD-10-CM | POA: Diagnosis not present

## 2021-06-28 DIAGNOSIS — Z09 Encounter for follow-up examination after completed treatment for conditions other than malignant neoplasm: Secondary | ICD-10-CM | POA: Diagnosis not present

## 2021-06-28 DIAGNOSIS — I10 Essential (primary) hypertension: Secondary | ICD-10-CM | POA: Diagnosis not present

## 2021-06-28 DIAGNOSIS — I700 Atherosclerosis of aorta: Secondary | ICD-10-CM | POA: Diagnosis not present

## 2021-06-28 DIAGNOSIS — E7800 Pure hypercholesterolemia, unspecified: Secondary | ICD-10-CM | POA: Diagnosis not present

## 2021-06-29 DIAGNOSIS — C652 Malignant neoplasm of left renal pelvis: Secondary | ICD-10-CM | POA: Diagnosis not present

## 2021-06-29 DIAGNOSIS — R8279 Other abnormal findings on microbiological examination of urine: Secondary | ICD-10-CM | POA: Diagnosis not present

## 2021-08-05 DIAGNOSIS — J441 Chronic obstructive pulmonary disease with (acute) exacerbation: Secondary | ICD-10-CM | POA: Diagnosis not present

## 2021-08-14 DIAGNOSIS — N183 Chronic kidney disease, stage 3 unspecified: Secondary | ICD-10-CM | POA: Diagnosis not present

## 2021-08-14 NOTE — Progress Notes (Signed)
Cardiology Office Note:    Date:  08/15/2021   ID:  Gregory Christensen, DOB [birth year], MRN [id number]  PCP:  Vernie Shanks, MD   [facility] Providers Cardiologist:  Werner Lean, MD     Referring MD: Vernie Shanks, MD   CC: Follow up chest tightness.  History of Present Illness:    Gregory Christensen is a 71 y.o. male with a hx of CAD, HTN with DM, COPD with active tobacco abuse on Home O2, CPE and DVT on IDA and hx of GI bleed who presents as a new patient 05/12/21.  Distally saw Dr. Acie Fredrickson (during the pandemic through telemedicine only).  Did not have CT in interim.  Had Melena and EGD 06/2021.  Patient notes that he is doing good. Still having rare chest tightness.  No change in quality from prior (vs slight improvement during some lines of questioning) Has had interval bleeding issues and and is being seen by GI.  Still having black stools and is having weight loss and iron.  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 Breathing is at baseline.  Had SOB with DOE with significant activity. Still smoking.   Past Medical History:  Diagnosis Date   Acute deep vein thrombosis (DVT) of left lower extremity (HCC)    Acute hypoxemic respiratory failure (HCC)    Anemia    Iron deficiency   Anxiety    Aortic atherosclerosis (HCC)    Arthritis    Benign prostatic hyperplasia 08/28/2018   UNSPECIFIED WHETHER LOWER URINARY TRACT SYMPTOMS PRESENT   CAD (coronary artery disease)    Cancer (HCC)    Chronic back pain    Chronic hip pain    CKD stage 3a 03/19/2021   Constipation    due to Iron M-W-F   COPD exacerbation ([place]) 10/18/2017   Dyspnea    Emphysema lung ([place])    Essential hypertension    GERD (gastroesophageal reflux disease)    Gout    Gross hematuria 09/08/2018   Head injury, acute, with loss of consciousness ([place])    unsure how long he was unresponsive   History of blood transfusion 05/2019   History of kidney stones    History of  tobacco abuse    Hypoxia    Insomnia    Oxygen dependent    2l- 24/7   Pneumonia    in 1980's   Pre-diabetes    Pulmonary embolism ([place]) 05/31/2018   Renal mass    CONCERNING FOR RENAL CELL CARCINOMA DR. Lovena Neighbours   Respiratory distress    Respiratory failure, acute-on-chronic ([place]) 08/03/2018   Seasonal allergies    Sleep apnea     Past Surgical History:  Procedure Laterality Date   BIOPSY  06/19/2021   Procedure: BIOPSY;  Surgeon: Wilford Corner, MD;  Location: [place];  Service: Endoscopy;;   COLONOSCOPY WITH PROPOFOL N/A 09/27/2019   Procedure: COLONOSCOPY WITH PROPOFOL;  Surgeon: Irene Shipper, MD;  [facility]: [facility];  Service: Endoscopy;  Laterality: N/A;   CYSTOSCOPY/URETEROSCOPY/HOLMIUM LASER/STENT PLACEMENT Left 10/31/2018   Procedure: CYSTOSCOPY/URETEROSCOPY;  Surgeon: Ceasar Mons, MD;  Location: [facility];  Service: Urology;  Laterality: Left;   ESOPHAGOGASTRODUODENOSCOPY (EGD) WITH PROPOFOL N/A 09/27/2019   Procedure: ESOPHAGOGASTRODUODENOSCOPY (EGD) WITH PROPOFOL;  Surgeon: Irene Shipper, MD;  [facility];  Service: Endoscopy;  Laterality: N/A;   ESOPHAGOGASTRODUODENOSCOPY (EGD) WITH PROPOFOL N/A 06/19/2021   Procedure: ESOPHAGOGASTRODUODENOSCOPY (EGD) WITH PROPOFOL;  Surgeon: Wilford Corner, MD;  Location[facility]  ENDOSCOPY;  Service: Endoscopy;  Laterality: N/A;   GIVENS CAPSULE STUDY N/A 06/20/2021   Procedure: GIVENS CAPSULE STUDY;  Surgeon: Wilford Corner, MD;  Location: [place];  Service: Endoscopy;  Laterality: N/A;   HERNIA REPAIR Left 1977   inguinal   HYDROCELE EXCISION Left 05/13/2019   Procedure: HYDROCELECTOMY ADULT;  Surgeon: Ceasar Mons, MD;  [facility];  Service: Urology;  Laterality: Left;   [place]   EXTENSIVE   POLYPECTOMY  09/27/2019   Procedure: POLYPECTOMY;  Surgeon: Irene Shipper, MD;  [facility];  Service: Endoscopy;;   ROBOT ASSITED LAPAROSCOPIC NEPHROURETERECTOMY  Left 10/31/2018   Procedure: XI ROBOT ASSITED LAPAROSCOPIC NEPHROURETERECTOMY;  Surgeon: Ceasar Mons, MD;  [facility];  Service: Urology;  Laterality: Left;  ONLY NEEDS 240 MIN FOR ALL PROCEDURES   SHOULDER ARTHROSCOPY WITH ROTATOR CUFF REPAIR Left 04/12/2020   Procedure: LEFT SHOULDER ARTHROSCOPY, DEBRIDEMENT, BICEPS TENODESIS, MINI OPEN ROTATOR CUFF TEAR REPAIR;  Surgeon: Meredith Pel, MD;  Location: [place];  Service: Orthopedics;  Laterality: Left;    Current Medications: Current Meds  Medication Sig   albuterol (PROVENTIL) (2.5 MG/3ML) 0.083% nebulizer solution Take 3 mLs (2.5 mg total) by nebulization every 6 (six) hours as needed for wheezing or shortness of breath.   albuterol (VENTOLIN HFA) 108 (90 Base) MCG/ACT inhaler Inhale 2 puffs into the lungs every 2 (two) hours as needed for wheezing or shortness of breath.   apixaban (ELIQUIS) 5 MG TABS tablet Take 1 tablet (5 mg total) by mouth 2 (two) times daily.   ascorbic acid (VITAMIN C) 500 MG tablet in the morning and at bedtime.   atorvastatin (LIPITOR) 80 MG tablet Take 1 tablet (80 mg total) by mouth daily.   Dextran 70-Hypromellose (ARTIFICIAL TEARS) 0.1-0.3 % SOLN Place 1 drop into both eyes daily as needed (dry eyes).   diclofenac Sodium (VOLTAREN) 1 % GEL Apply 2 g topically 2 (two) times daily as needed (Pain).   docusate sodium (COLACE) 100 MG capsule Take 100 mg by mouth daily as needed for mild constipation.   ferrous sulfate 325 (65 FE) MG tablet Take 1 tablet (325 mg total) by mouth daily.   fluticasone (FLONASE) 50 MCG/ACT nasal spray Place into both nostrils as needed for allergies or rhinitis.   fluticasone-salmeterol (ADVAIR) 250-50 MCG/ACT AEPB Inhale 1 puff into the lungs in the morning and at bedtime.   gabapentin (NEURONTIN) 300 MG capsule Take 300 mg by mouth 3 (three) times daily.   levocetirizine (XYZAL) 5 MG tablet Take 5 mg by mouth as needed.   lidocaine (LIDODERM) 5 % Place 1 patch  onto the skin every 12 (twelve) hours as needed (pain).   methenamine (HIPREX) 1 g tablet in the morning and at bedtime.   montelukast (SINGULAIR) 10 MG tablet Take 10 mg by mouth at bedtime.   nitroGLYCERIN (NITROSTAT) 0.4 MG SL tablet Take up to 3 tablets as needed for chest pain.   pantoprazole (PROTONIX) 40 MG tablet Take 1 tablet (40 mg total) by mouth daily.   sildenafil (VIAGRA) 100 MG tablet Take 100 mg by mouth daily as needed for erectile dysfunction.   tamsulosin (FLOMAX) 0.4 MG CAPS capsule Take 0.4 mg by mouth daily.   Tiotropium Bromide Monohydrate (SPIRIVA RESPIMAT) 2.5 MCG/ACT AERS Inhale 2 puffs into the lungs daily.   [DISCONTINUED] amLODipine (NORVASC) 2.5 MG tablet Take 2.5 mg by mouth daily.   [DISCONTINUED] chlorhexidine (HIBICLENS) 4 % external liquid See admin  instructions.     Allergies:   Patient has no known allergies.   Social History   Socioeconomic History   Marital status: Divorced    Spouse name: Not on file   Number of children: Not on file   Years of education: Not on file   Highest education level: Not on file  Occupational History   Not on file  Tobacco Use   Smoking status: Former    Packs/day: 0.50    Years: 57.00    Pack years: 28.50    Types: Cigarettes   Smokeless tobacco: Never  Vaping Use   Vaping Use: Never used  Substance and Sexual Activity   Alcohol use: Not Currently    Alcohol/week: 10.0 standard drinks    Types: 10 Glasses of wine per week    Comment: 0-2 beers a day    Drug use: Not Currently    Types: Flunitrazepam   Sexual activity: Not on file  Other Topics Concern   Not on file  Social History Narrative   Not on file   Social Determinants of Health   Financial Resource Strain: Not on file  Food Insecurity: Not on file  Transportation Needs: Not on file  Physical Activity: Not on file  Stress: Not on file  Social Connections: Not on file    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  ROS:   Please see the history of present illness.     All other systems reviewed and are negative.  EKGs/Labs/Other Studies Reviewed:    The following studies were reviewed today:  EKG:   05/12/21: SR rate 82 Low voltage with baseline artifact  Transthoracic Echocardiogram: Date: 03/20/21 Results:  1. Left ventricular ejection fraction, by estimation, is 60 to 65%. The  left ventricle has normal function. The left ventricle has no regional  wall motion abnormalities. Left ventricular diastolic parameters were  normal.   2. Right ventricular systolic function is low normal. The right  ventricular size is mildly enlarged.   3. The mitral valve is normal in structure. Trivial mitral valve  regurgitation.   4. The aortic valve is normal in structure. Aortic valve regurgitation is  not visualized.   NonCardiac CT: Date: 08/02/18 Results: Mild CAC and Aortic Athero IMPRESSION: 1. Resolution of previously noted right upper lobe lobar pulmonary embolus. No large central pulmonary embolus is identified. 2. Panlobular emphysema with new areas of subsegmental atelectasis and/or scarring in the right upper lobe and right middle lobe. 3. Respiratory motion and left arm streak artifact simulating pulmonary emboli to the right lower lobe are identified. No acute pulmonary embolus is currently noted.   Aortic Atherosclerosis (ICD10-I70.0) and Emphysema (ICD10-J43.9).  ECG or NM Stress Testing : Date: 10/17/18 Results: Nuclear stress EF: 71%. Normal perfusion with minimal soft tissue attenuation. No ischemia or scar This is a low risk study.     Recent Labs: 06/17/2021: ALT 18 06/20/2021: BUN 11; Creatinine, Ser 1.66; Potassium 4.2; Sodium 138 06/21/2021: Hemoglobin 8.1; Platelets 298  Recent Lipid Panel No results found for: CHOL, TRIG, HDL, CHOLHDL, VLDL, LDLCALC,  LDLDIRECT       Physical Exam:    VS:  BP (!) 80/58    Pulse (!) 107    Ht 6\' 1"  (1.854 m)    Wt 73 kg    SpO2 99%    BMI 21.24 kg/m  Wt Readings from Last 3 Encounters:  08/15/21 73 kg  06/18/21 77.1 kg  05/18/21 75.8 kg    Gen: No distress, Thin male appears older that stated age   Neck: No JVD Ears: Bilateral Pilar Plate Sign Cardiac: No Rubs or Gallops, no murmur, normal rate , +2 radial pulses; No RV heave Respiratory: Bilateral expiratory wheezes normal effort, normal  respiratory rate GI: Soft, nontender, non-distended  MS: No  edema;  moves all extremities Integument: Skin feels warm Neuro:  At time of evaluation, alert and oriented to person/place/time/situation  Psych: Normal affect, patient feels well   ASSESSMENT:    1. Iron deficiency anemia, unspecified iron deficiency anemia type   2. Coronary artery calcification   3. Aortic atherosclerosis ([place])   4. Gastrointestinal hemorrhage, unspecified gastrointestinal hemorrhage type     PLAN:    Chest tightness, coronary artery calcifications; aortic atherosclerosis HTN with DM, presently with hypotension Active Tobacco Abuse COPD who continues to smoke on home O2 HX of PE and DVT on Eliquis IDA and hx of GI bleed small bowel AVMs with recent EGD 12/2 CKD Stage IIIb - given his bleeding issues, his CKD, and active smoking, we have limited options for procedural intervention (unable to do lexican, cannot tolerate stress NM study, intervention if a positive lesion would be complicated by bleeding risk) - reviewed 2020 CT Chest with patient - will stop Norvasc given hypotension - Will set Hgb goal of 8, check Iron studies, check lipids - patient was recommended to return to eliquis after discharge for non-cardiac indications - continue atorvastatin 80 mg PO daily - has PRN nitrogylcerin - needs to stop smoking - will send copies of note to Dr. Jacelyn Grip and Dr. Kennon Rounds ([facility])    4-5 months me or APP   Medication  Adjustments/Labs and Tests Ordered: Current medicines are reviewed at length with the patient today.  Concerns regarding medicines are outlined above.  Orders Placed This Encounter  Procedures   CBC   Lipid panel   Iron, TIBC and Ferritin Panel    No orders of the defined types were placed in this encounter.    Patient Instructions  Medication Instructions:  Your physician has recommended you make the following change in your medication:  STOP: amlodipine (Norvasc)   *If you need a refill on your cardiac medications before your next appointment, please call your pharmacy*   Lab Work: TODAY: CBC, iron study, and lipid panel If you have labs (blood work) drawn today and your tests are completely normal, you will receive your results only by: [place] (if you have MyChart) OR A paper copy in the mail If you have any lab test that is abnormal or we need to change your treatment, we will call you to review the results.   Testing/Procedures: NONE   Follow-Up: At [facility], you and your health needs are our priority.  As part of our continuing mission to provide you with exceptional heart care, we have created designated Provider Care Teams.  These Care Teams include your primary Cardiologist (physician) and Advanced Practice Providers (APPs -  Physician Assistants and Nurse Practitioners) who all work together to provide you with the care you need, when you need it.  We recommend signing up for the patient portal called "MyChart".  Sign up information is provided on this After Visit Summary.  MyChart is used to connect with patients for Virtual Visits (Telemedicine).  Patients are able to view lab/test results, encounter notes, upcoming appointments, etc.  Non-urgent messages can be sent to your provider as well.   To learn more about what you can do with MyChart, go to [email or web address].    Your next appointment:   4 -5  month(s)  The format for your next  appointment:   In Person  Provider:   Werner Lean, MD     Other Instructions Monitor BP     Signed, Werner Lean, MD  08/15/2021 10:08 AM    [place]

## 2021-08-15 ENCOUNTER — Encounter: Payer: Self-pay | Admitting: Internal Medicine

## 2021-08-15 ENCOUNTER — Ambulatory Visit (INDEPENDENT_AMBULATORY_CARE_PROVIDER_SITE_OTHER): Payer: Medicare Other | Admitting: Internal Medicine

## 2021-08-15 ENCOUNTER — Other Ambulatory Visit: Payer: Self-pay

## 2021-08-15 VITALS — BP 80/58 | HR 107 | Ht 73.0 in | Wt 161.0 lb

## 2021-08-15 DIAGNOSIS — K922 Gastrointestinal hemorrhage, unspecified: Secondary | ICD-10-CM

## 2021-08-15 DIAGNOSIS — I2510 Atherosclerotic heart disease of native coronary artery without angina pectoris: Secondary | ICD-10-CM

## 2021-08-15 DIAGNOSIS — D509 Iron deficiency anemia, unspecified: Secondary | ICD-10-CM

## 2021-08-15 DIAGNOSIS — I700 Atherosclerosis of aorta: Secondary | ICD-10-CM | POA: Diagnosis not present

## 2021-08-15 DIAGNOSIS — I2584 Coronary atherosclerosis due to calcified coronary lesion: Secondary | ICD-10-CM

## 2021-08-15 LAB — IRON,TIBC AND FERRITIN PANEL
Ferritin: 43 ng/mL (ref 30–400)
Iron Saturation: >95 % (ref 15–55)
Iron: 352 ug/dL (ref 38–169)
Total Iron Binding Capacity: <369 ug/dL (ref 250–450)
UIBC: <17 ug/dL — ABNORMAL LOW (ref 111–343)

## 2021-08-15 LAB — LIPID PANEL
Chol/HDL Ratio: 2.7 ratio (ref 0.0–5.0)
Cholesterol, Total: 141 mg/dL (ref 100–199)
HDL: 52 mg/dL (ref >39)
LDL Chol Calc (NIH): 64 mg/dL (ref 0–99)
Triglycerides: 148 mg/dL (ref 0–149)
VLDL Cholesterol Cal: 25 mg/dL (ref 5–40)

## 2021-08-15 LAB — CBC
Hematocrit: 36.1 % — ABNORMAL LOW (ref 37.5–51.0)
Hemoglobin: 11.3 g/dL — ABNORMAL LOW (ref 13.0–17.7)
MCH: 26.9 pg (ref 26.6–33.0)
MCHC: 31.3 g/dL — ABNORMAL LOW (ref 31.5–35.7)
MCV: 86 fL (ref 79–97)
Platelets: 345 10*3/uL (ref 150–450)
RBC: 4.2 x10E6/uL (ref 4.14–5.80)
RDW: 15.2 % (ref 11.6–15.4)
WBC: 6.6 10*3/uL (ref 3.4–10.8)

## 2021-08-15 NOTE — Patient Instructions (Signed)
Medication Instructions:  Your physician has recommended you make the following change in your medication:  STOP: amlodipine (Norvasc)   *If you need a refill on your cardiac medications before your next appointment, please call your pharmacy*   Lab Work: TODAY: CBC, iron study, and lipid panel If you have labs (blood work) drawn today and your tests are completely normal, you will receive your results only by: [place] (if you have MyChart) OR A paper copy in the mail If you have any lab test that is abnormal or we need to change your treatment, we will call you to review the results.   Testing/Procedures: NONE   Follow-Up: At [facility], you and your health needs are our priority.  As part of our continuing mission to provide you with exceptional heart care, we have created designated Provider Care Teams.  These Care Teams include your primary Cardiologist (physician) and Advanced Practice Providers (APPs -  Physician Assistants and Nurse Practitioners) who all work together to provide you with the care you need, when you need it.  We recommend signing up for the patient portal called "MyChart".  Sign up information is provided on this After Visit Summary.  MyChart is used to connect with patients for Virtual Visits (Telemedicine).  Patients are able to view lab/test results, encounter notes, upcoming appointments, etc.  Non-urgent messages can be sent to your provider as well.   To learn more about what you can do with MyChart, go to [email or web address].    Your next appointment:   4 -5  month(s)  The format for your next appointment:   In Person  Provider:   Werner Lean, MD     Other Instructions Monitor BP

## 2021-08-18 ENCOUNTER — Telehealth: Payer: Self-pay

## 2021-08-18 DIAGNOSIS — E7849 Other hyperlipidemia: Secondary | ICD-10-CM

## 2021-08-18 DIAGNOSIS — Z79899 Other long term (current) drug therapy: Secondary | ICD-10-CM

## 2021-08-18 DIAGNOSIS — I2510 Atherosclerotic heart disease of native coronary artery without angina pectoris: Secondary | ICD-10-CM

## 2021-08-18 MED ORDER — EZETIMIBE 10 MG PO TABS: 10.0000 mg | ORAL_TABLET | Freq: Every day | ORAL | 3 refills | Status: DC

## 2021-08-18 NOTE — Telephone Encounter (Signed)
Spoke with patient and provided lab results. Sent results to PCP to review and possibly d/c iron supplementation. Scheduled lab appt and placed orders for 11/20/21.

## 2021-08-18 NOTE — Telephone Encounter (Signed)
-----   Message from Werner Lean, MD sent at 08/17/2021 10:06 AM EST ----- Results: Iron has significantly improved Hgb has improved LDL is slightly above goal Plan: - send results to PCP re: iron.  He may be able to stop iron supplements - will add zetia 10 mg and due lipids and ALT in three months - at next visit if, non cardiac co-morbidities continue to improve, he may be a candidate for ischemic testing  Werner Lean, MD

## 2021-08-25 DIAGNOSIS — I129 Hypertensive chronic kidney disease with stage 1 through stage 4 chronic kidney disease, or unspecified chronic kidney disease: Secondary | ICD-10-CM | POA: Diagnosis not present

## 2021-08-25 DIAGNOSIS — N183 Chronic kidney disease, stage 3 unspecified: Secondary | ICD-10-CM | POA: Diagnosis not present

## 2021-09-02 DIAGNOSIS — J441 Chronic obstructive pulmonary disease with (acute) exacerbation: Secondary | ICD-10-CM | POA: Diagnosis not present

## 2021-09-15 DIAGNOSIS — K921 Melena: Secondary | ICD-10-CM | POA: Diagnosis not present

## 2021-09-15 DIAGNOSIS — K5520 Angiodysplasia of colon without hemorrhage: Secondary | ICD-10-CM | POA: Diagnosis not present

## 2021-09-21 DIAGNOSIS — C652 Malignant neoplasm of left renal pelvis: Secondary | ICD-10-CM | POA: Diagnosis not present

## 2021-09-26 DIAGNOSIS — C652 Malignant neoplasm of left renal pelvis: Secondary | ICD-10-CM | POA: Diagnosis not present

## 2021-09-27 ENCOUNTER — Telehealth: Payer: Self-pay | Admitting: Pulmonary Disease

## 2021-09-28 DIAGNOSIS — C652 Malignant neoplasm of left renal pelvis: Secondary | ICD-10-CM | POA: Diagnosis not present

## 2021-09-28 DIAGNOSIS — R3915 Urgency of urination: Secondary | ICD-10-CM | POA: Diagnosis not present

## 2021-09-28 DIAGNOSIS — N390 Urinary tract infection, site not specified: Secondary | ICD-10-CM | POA: Diagnosis not present

## 2021-09-28 NOTE — Telephone Encounter (Signed)
Called [employer] and spoke with rep. She was calling to see if we have received a fax form sent on 3/28 for pt's neb meds - albuterol, perforomist, budesonide. Fax was sent to [facility].  ? ?Will forward to [facility] to see if we received form.  ?

## 2021-09-29 NOTE — Telephone Encounter (Signed)
Janett Billow from [facility] is checking on fax sent 09/28/2021. Janett Billow phone number is [phone]. ?

## 2021-09-29 NOTE — Telephone Encounter (Signed)
Form received, placed in TP's sign folder, I will have her sign it today and have it faxed. ?

## 2021-09-29 NOTE — Telephone Encounter (Signed)
Called and spoke with Marcie Bal at [facility]. I have asked her to change the prescription to Dr. August Albino name so that he can sign the RX on Monday. We are not sure if TP ever received the RX. She will fax the prescription today. Will give to [place] on Monday to sign.  ?

## 2021-10-03 ENCOUNTER — Telehealth: Payer: Self-pay | Admitting: Adult Health

## 2021-10-03 NOTE — Telephone Encounter (Signed)
Called Jessica from [facility] and told her that the patient is not scheduled for an appointment until 4/19 and that after that appointment we can fax over the office notes to her and the new neb rx on this day as well.  ? Janett Billow phone number [phone] ?Fax number [phone] ? ?Nothing further until visit  ?

## 2021-10-04 DIAGNOSIS — K921 Melena: Secondary | ICD-10-CM | POA: Diagnosis not present

## 2021-10-04 NOTE — Telephone Encounter (Signed)
Called and spoke with another staff member regarding rx and OV notes.  They received the rx, he was last seen in October of 2022, however has an upcoming appointment on 4/19.  After his f/u appointment, the OV notes and a new RX will need to be faxed.  Leave open until this has been completed. ?

## 2021-10-11 DIAGNOSIS — K921 Melena: Secondary | ICD-10-CM | POA: Diagnosis not present

## 2021-10-13 DIAGNOSIS — J449 Chronic obstructive pulmonary disease, unspecified: Secondary | ICD-10-CM | POA: Diagnosis not present

## 2021-10-13 DIAGNOSIS — I10 Essential (primary) hypertension: Secondary | ICD-10-CM | POA: Diagnosis not present

## 2021-10-13 DIAGNOSIS — E785 Hyperlipidemia, unspecified: Secondary | ICD-10-CM | POA: Diagnosis not present

## 2021-10-13 DIAGNOSIS — N1832 Chronic kidney disease, stage 3b: Secondary | ICD-10-CM | POA: Diagnosis not present

## 2021-10-18 ENCOUNTER — Other Ambulatory Visit: Payer: Self-pay

## 2021-10-18 ENCOUNTER — Encounter: Payer: Self-pay | Admitting: Adult Health

## 2021-10-18 ENCOUNTER — Ambulatory Visit (INDEPENDENT_AMBULATORY_CARE_PROVIDER_SITE_OTHER): Payer: Medicare Other | Admitting: Adult Health

## 2021-10-18 ENCOUNTER — Encounter (HOSPITAL_COMMUNITY): Payer: Self-pay | Admitting: Oncology

## 2021-10-18 ENCOUNTER — Inpatient Hospital Stay (HOSPITAL_COMMUNITY)
Admission: EM | Admit: 2021-10-18 | Discharge: 2021-10-23 | DRG: 190 | Disposition: A | Discharge status: Home / Self Care | Payer: No Typology Code available for payment source | Attending: Internal Medicine | Admitting: Internal Medicine

## 2021-10-18 ENCOUNTER — Emergency Department (HOSPITAL_COMMUNITY)
Admission: EM | Admit: 2021-10-18 | Discharge: 2021-10-18 | Disposition: A | Discharge status: Home / Self Care | Payer: No Typology Code available for payment source | Attending: Emergency Medicine | Admitting: Emergency Medicine

## 2021-10-18 ENCOUNTER — Emergency Department (HOSPITAL_COMMUNITY): Payer: No Typology Code available for payment source

## 2021-10-18 ENCOUNTER — Ambulatory Visit (INDEPENDENT_AMBULATORY_CARE_PROVIDER_SITE_OTHER): Payer: Medicare Other

## 2021-10-18 VITALS — BP 120/50 | HR 95 | Temp 97.5°F | Ht 73.0 in | Wt 160.8 lb

## 2021-10-18 DIAGNOSIS — Z20822 Contact with and (suspected) exposure to covid-19: Secondary | ICD-10-CM | POA: Insufficient documentation

## 2021-10-18 DIAGNOSIS — N189 Chronic kidney disease, unspecified: Secondary | ICD-10-CM | POA: Insufficient documentation

## 2021-10-18 DIAGNOSIS — Z7951 Long term (current) use of inhaled steroids: Secondary | ICD-10-CM | POA: Diagnosis not present

## 2021-10-18 DIAGNOSIS — Z833 Family history of diabetes mellitus: Secondary | ICD-10-CM

## 2021-10-18 DIAGNOSIS — N400 Enlarged prostate without lower urinary tract symptoms: Secondary | ICD-10-CM | POA: Diagnosis present

## 2021-10-18 DIAGNOSIS — D62 Acute posthemorrhagic anemia: Secondary | ICD-10-CM | POA: Diagnosis present

## 2021-10-18 DIAGNOSIS — G8929 Other chronic pain: Secondary | ICD-10-CM | POA: Diagnosis present

## 2021-10-18 DIAGNOSIS — J9611 Chronic respiratory failure with hypoxia: Secondary | ICD-10-CM

## 2021-10-18 DIAGNOSIS — E1122 Type 2 diabetes mellitus with diabetic chronic kidney disease: Secondary | ICD-10-CM | POA: Diagnosis present

## 2021-10-18 DIAGNOSIS — M549 Dorsalgia, unspecified: Secondary | ICD-10-CM | POA: Diagnosis present

## 2021-10-18 DIAGNOSIS — K31811 Angiodysplasia of stomach and duodenum with bleeding: Secondary | ICD-10-CM | POA: Diagnosis present

## 2021-10-18 DIAGNOSIS — Z87442 Personal history of urinary calculi: Secondary | ICD-10-CM

## 2021-10-18 DIAGNOSIS — J441 Chronic obstructive pulmonary disease with (acute) exacerbation: Secondary | ICD-10-CM

## 2021-10-18 DIAGNOSIS — D649 Anemia, unspecified: Secondary | ICD-10-CM | POA: Diagnosis present

## 2021-10-18 DIAGNOSIS — Z8719 Personal history of other diseases of the digestive system: Secondary | ICD-10-CM

## 2021-10-18 DIAGNOSIS — E611 Iron deficiency: Secondary | ICD-10-CM | POA: Diagnosis present

## 2021-10-18 DIAGNOSIS — I959 Hypotension, unspecified: Secondary | ICD-10-CM | POA: Diagnosis present

## 2021-10-18 DIAGNOSIS — D72829 Elevated white blood cell count, unspecified: Secondary | ICD-10-CM | POA: Diagnosis not present

## 2021-10-18 DIAGNOSIS — Z79899 Other long term (current) drug therapy: Secondary | ICD-10-CM

## 2021-10-18 DIAGNOSIS — D631 Anemia in chronic kidney disease: Secondary | ICD-10-CM | POA: Diagnosis present

## 2021-10-18 DIAGNOSIS — J9621 Acute and chronic respiratory failure with hypoxia: Secondary | ICD-10-CM | POA: Diagnosis not present

## 2021-10-18 DIAGNOSIS — Z7901 Long term (current) use of anticoagulants: Secondary | ICD-10-CM | POA: Insufficient documentation

## 2021-10-18 DIAGNOSIS — R197 Diarrhea, unspecified: Secondary | ICD-10-CM | POA: Diagnosis not present

## 2021-10-18 DIAGNOSIS — Z9981 Dependence on supplemental oxygen: Secondary | ICD-10-CM

## 2021-10-18 DIAGNOSIS — Z7952 Long term (current) use of systemic steroids: Secondary | ICD-10-CM | POA: Insufficient documentation

## 2021-10-18 DIAGNOSIS — Z743 Need for continuous supervision: Secondary | ICD-10-CM | POA: Diagnosis not present

## 2021-10-18 DIAGNOSIS — Z8249 Family history of ischemic heart disease and other diseases of the circulatory system: Secondary | ICD-10-CM

## 2021-10-18 DIAGNOSIS — Z87891 Personal history of nicotine dependence: Secondary | ICD-10-CM

## 2021-10-18 DIAGNOSIS — E785 Hyperlipidemia, unspecified: Secondary | ICD-10-CM | POA: Diagnosis present

## 2021-10-18 DIAGNOSIS — R079 Chest pain, unspecified: Secondary | ICD-10-CM | POA: Diagnosis not present

## 2021-10-18 DIAGNOSIS — R0602 Shortness of breath: Secondary | ICD-10-CM | POA: Diagnosis present

## 2021-10-18 DIAGNOSIS — N1831 Chronic kidney disease, stage 3a: Secondary | ICD-10-CM | POA: Diagnosis present

## 2021-10-18 DIAGNOSIS — J439 Emphysema, unspecified: Secondary | ICD-10-CM | POA: Diagnosis not present

## 2021-10-18 DIAGNOSIS — I2699 Other pulmonary embolism without acute cor pulmonale: Secondary | ICD-10-CM

## 2021-10-18 DIAGNOSIS — Z86718 Personal history of other venous thrombosis and embolism: Secondary | ICD-10-CM

## 2021-10-18 DIAGNOSIS — Z86711 Personal history of pulmonary embolism: Secondary | ICD-10-CM

## 2021-10-18 DIAGNOSIS — K921 Melena: Secondary | ICD-10-CM | POA: Diagnosis present

## 2021-10-18 DIAGNOSIS — M109 Gout, unspecified: Secondary | ICD-10-CM | POA: Diagnosis present

## 2021-10-18 DIAGNOSIS — I25119 Atherosclerotic heart disease of native coronary artery with unspecified angina pectoris: Secondary | ICD-10-CM | POA: Diagnosis present

## 2021-10-18 DIAGNOSIS — R0689 Other abnormalities of breathing: Secondary | ICD-10-CM | POA: Diagnosis not present

## 2021-10-18 DIAGNOSIS — N183 Chronic kidney disease, stage 3 unspecified: Secondary | ICD-10-CM | POA: Diagnosis present

## 2021-10-18 DIAGNOSIS — J9620 Acute and chronic respiratory failure, unspecified whether with hypoxia or hypercapnia: Secondary | ICD-10-CM | POA: Diagnosis present

## 2021-10-18 DIAGNOSIS — I129 Hypertensive chronic kidney disease with stage 1 through stage 4 chronic kidney disease, or unspecified chronic kidney disease: Secondary | ICD-10-CM | POA: Diagnosis present

## 2021-10-18 DIAGNOSIS — E875 Hyperkalemia: Secondary | ICD-10-CM | POA: Diagnosis not present

## 2021-10-18 DIAGNOSIS — J449 Chronic obstructive pulmonary disease, unspecified: Secondary | ICD-10-CM

## 2021-10-18 LAB — CBC WITH DIFFERENTIAL/PLATELET
Abs Immature Granulocytes: 0.04 10*3/uL (ref 0.00–0.07)
Basophils Absolute: 0 10*3/uL (ref 0.0–0.1)
Basophils Relative: 0 %
Eosinophils Absolute: 0.1 10*3/uL (ref 0.0–0.5)
Eosinophils Relative: 1 %
HCT: 31.2 % — ABNORMAL LOW (ref 39.0–52.0)
Hemoglobin: 8.8 g/dL — ABNORMAL LOW (ref 13.0–17.0)
Immature Granulocytes: 1 %
Lymphocytes Relative: 22 %
Lymphs Abs: 1.6 10*3/uL (ref 0.7–4.0)
MCH: 24.5 pg — ABNORMAL LOW (ref 26.0–34.0)
MCHC: 28.2 g/dL — ABNORMAL LOW (ref 30.0–36.0)
MCV: 86.9 fL (ref 80.0–100.0)
Monocytes Absolute: 0.7 10*3/uL (ref 0.1–1.0)
Monocytes Relative: 9 %
Neutro Abs: 4.6 10*3/uL (ref 1.7–7.7)
Neutrophils Relative %: 67 %
Platelets: 202 10*3/uL (ref 150–400)
RBC: 3.59 MIL/uL — ABNORMAL LOW (ref 4.22–5.81)
RDW: 18.6 % — ABNORMAL HIGH (ref 11.5–15.5)
WBC: 7 10*3/uL (ref 4.0–10.5)
nRBC: 0 % (ref 0.0–0.2)

## 2021-10-18 LAB — BRAIN NATRIURETIC PEPTIDE: B Natriuretic Peptide: 9.9 pg/mL (ref 0.0–100.0)

## 2021-10-18 LAB — BASIC METABOLIC PANEL
Anion gap: 6 (ref 5–15)
BUN: 10 mg/dL (ref 8–23)
CO2: 21 mmol/L — ABNORMAL LOW (ref 22–32)
Calcium: 8.6 mg/dL — ABNORMAL LOW (ref 8.9–10.3)
Chloride: 110 mmol/L (ref 98–111)
Creatinine, Ser: 1.51 mg/dL — ABNORMAL HIGH (ref 0.61–1.24)
GFR, Estimated: 49 mL/min — ABNORMAL LOW (ref >60)
Glucose, Bld: 123 mg/dL — ABNORMAL HIGH (ref 70–99)
Potassium: 4.3 mmol/L (ref 3.5–5.1)
Sodium: 137 mmol/L (ref 135–145)

## 2021-10-18 LAB — TROPONIN I (HIGH SENSITIVITY)
Troponin I (High Sensitivity): 3 ng/L (ref <18)
Troponin I (High Sensitivity): 3 ng/L (ref <18)
Troponin I (High Sensitivity): 3 ng/L (ref <18)

## 2021-10-18 LAB — RESP PANEL BY RT-PCR (FLU A&B, COVID) ARPGX2
Influenza A by PCR: NEGATIVE
Influenza B by PCR: NEGATIVE
SARS Coronavirus 2 by RT PCR: NEGATIVE

## 2021-10-18 LAB — POC COVID19 BINAXNOW: SARS Coronavirus 2 Ag: NEGATIVE

## 2021-10-18 MED ORDER — SODIUM CHLORIDE 0.9 % IV SOLN
1.0000 g | INTRAVENOUS | Status: DC
  Administered 2021-10-18 – 2021-10-19 (×2): 1 g via INTRAVENOUS
  Filled 2021-10-18 (×2): qty 10

## 2021-10-18 MED ORDER — MAGNESIUM SULFATE 2 GM/50ML IV SOLN
2.0000 g | Freq: Once | INTRAVENOUS | Status: AC
  Administered 2021-10-18: 2 g via INTRAVENOUS
  Filled 2021-10-18: qty 50

## 2021-10-18 MED ORDER — NITROGLYCERIN 2 % TD OINT
0.5000 [in_us] | TOPICAL_OINTMENT | Freq: Once | TRANSDERMAL | Status: AC
  Administered 2021-10-18: 0.5 [in_us] via TOPICAL
  Filled 2021-10-18: qty 1

## 2021-10-18 MED ORDER — MOMETASONE FURO-FORMOTEROL FUM 200-5 MCG/ACT IN AERO
2.0000 | INHALATION_SPRAY | Freq: Two times a day (BID) | RESPIRATORY_TRACT | Status: DC
  Administered 2021-10-19 – 2021-10-23 (×9): 2 via RESPIRATORY_TRACT
  Filled 2021-10-18: qty 8.8

## 2021-10-18 MED ORDER — CALCIUM CARBONATE ANTACID 500 MG PO CHEW
1.0000 | CHEWABLE_TABLET | Freq: Once | ORAL | Status: AC
  Administered 2021-10-19: 200 mg via ORAL
  Filled 2021-10-18: qty 1

## 2021-10-18 MED ORDER — POLYVINYL ALCOHOL 1.4 % OP SOLN: 1.0000 [drp] | Freq: Every day | OPHTHALMIC | Status: DC | PRN

## 2021-10-18 MED ORDER — PREDNISONE 10 MG (21) PO TBPK: ORAL_TABLET | Freq: Every day | ORAL | 0 refills | Status: DC

## 2021-10-18 MED ORDER — DOCUSATE SODIUM 100 MG PO CAPS: 100.0000 mg | ORAL_CAPSULE | Freq: Every day | ORAL | Status: DC | PRN

## 2021-10-18 MED ORDER — ACETAMINOPHEN 325 MG PO TABS
650.0000 mg | ORAL_TABLET | Freq: Four times a day (QID) | ORAL | Status: DC | PRN
  Administered 2021-10-18 – 2021-10-22 (×10): 650 mg via ORAL
  Filled 2021-10-18 (×10): qty 2

## 2021-10-18 MED ORDER — TAMSULOSIN HCL 0.4 MG PO CAPS
0.4000 mg | ORAL_CAPSULE | Freq: Every day | ORAL | Status: DC
  Administered 2021-10-18 – 2021-10-22 (×5): 0.4 mg via ORAL
  Filled 2021-10-18 (×5): qty 1

## 2021-10-18 MED ORDER — ALBUTEROL SULFATE (2.5 MG/3ML) 0.083% IN NEBU
2.5000 mg | INHALATION_SOLUTION | Freq: Once | RESPIRATORY_TRACT | Status: AC
  Administered 2021-10-18: 2.5 mg via RESPIRATORY_TRACT

## 2021-10-18 MED ORDER — FINASTERIDE 5 MG PO TABS
5.0000 mg | ORAL_TABLET | Freq: Every day | ORAL | Status: DC
Start: 2021-10-19 — End: 2021-10-23
  Administered 2021-10-19 – 2021-10-22 (×4): 5 mg via ORAL
  Filled 2021-10-18 (×4): qty 1

## 2021-10-18 MED ORDER — GABAPENTIN 300 MG PO CAPS
300.0000 mg | ORAL_CAPSULE | Freq: Three times a day (TID) | ORAL | Status: DC
  Administered 2021-10-18 – 2021-10-22 (×13): 300 mg via ORAL
  Filled 2021-10-18 (×13): qty 1

## 2021-10-18 MED ORDER — MONTELUKAST SODIUM 10 MG PO TABS
10.0000 mg | ORAL_TABLET | Freq: Every day | ORAL | Status: DC
  Administered 2021-10-19 – 2021-10-22 (×5): 10 mg via ORAL
  Filled 2021-10-18 (×5): qty 1

## 2021-10-18 MED ORDER — ISOSORBIDE MONONITRATE 10 MG PO TABS: 10.0000 mg | ORAL_TABLET | Freq: Two times a day (BID) | ORAL | Status: DC

## 2021-10-18 MED ORDER — APIXABAN 5 MG PO TABS
5.0000 mg | ORAL_TABLET | Freq: Two times a day (BID) | ORAL | Status: DC
  Administered 2021-10-18 – 2021-10-19 (×2): 5 mg via ORAL
  Filled 2021-10-18 (×2): qty 1

## 2021-10-18 MED ORDER — MORPHINE SULFATE (PF) 2 MG/ML IV SOLN
1.0000 mg | Freq: Once | INTRAVENOUS | Status: AC
  Administered 2021-10-18: 1 mg via INTRAVENOUS
  Filled 2021-10-18: qty 1

## 2021-10-18 MED ORDER — IPRATROPIUM-ALBUTEROL 0.5-2.5 (3) MG/3ML IN SOLN
3.0000 mL | Freq: Once | RESPIRATORY_TRACT | Status: AC
Start: 2021-10-18 — End: 2021-10-18
  Administered 2021-10-18: 3 mL via RESPIRATORY_TRACT
  Filled 2021-10-18: qty 3

## 2021-10-18 MED ORDER — ISOSORBIDE MONONITRATE 10 MG PO TABS
10.0000 mg | ORAL_TABLET | Freq: Two times a day (BID) | ORAL | Status: DC
Start: 2021-10-18 — End: 2021-10-18

## 2021-10-18 MED ORDER — METHENAMINE MANDELATE 0.5 G PO TABS
1.0000 g | ORAL_TABLET | Freq: Two times a day (BID) | ORAL | Status: DC
  Administered 2021-10-19 – 2021-10-21 (×6): 1 g via ORAL
  Filled 2021-10-18 (×7): qty 2

## 2021-10-18 MED ORDER — AMOXICILLIN-POT CLAVULANATE 875-125 MG PO TABS: 1.0000 | ORAL_TABLET | Freq: Two times a day (BID) | ORAL | 0 refills | Status: DC

## 2021-10-18 MED ORDER — ALBUTEROL SULFATE (2.5 MG/3ML) 0.083% IN NEBU
10.0000 mg | INHALATION_SOLUTION | RESPIRATORY_TRACT | Status: AC
  Administered 2021-10-18: 10 mg via RESPIRATORY_TRACT
  Filled 2021-10-18: qty 12

## 2021-10-18 MED ORDER — ALBUTEROL SULFATE (2.5 MG/3ML) 0.083% IN NEBU: 2.5000 mg | INHALATION_SOLUTION | Freq: Four times a day (QID) | RESPIRATORY_TRACT | 2 refills | Status: DC | PRN

## 2021-10-18 MED ORDER — METHYLPREDNISOLONE SODIUM SUCC 125 MG IJ SOLR
125.0000 mg | Freq: Once | INTRAMUSCULAR | Status: AC
  Administered 2021-10-18: 125 mg via INTRAVENOUS
  Filled 2021-10-18: qty 2

## 2021-10-18 MED ORDER — FERROUS SULFATE 325 (65 FE) MG PO TABS
325.0000 mg | ORAL_TABLET | Freq: Every day | ORAL | Status: DC
Start: 2021-10-19 — End: 2021-10-23
  Administered 2021-10-19 – 2021-10-22 (×4): 325 mg via ORAL
  Filled 2021-10-18 (×4): qty 1

## 2021-10-18 MED ORDER — ISOSORBIDE MONONITRATE 10 MG PO TABS
5.0000 mg | ORAL_TABLET | Freq: Two times a day (BID) | ORAL | Status: DC
  Administered 2021-10-19 (×2): 5 mg via ORAL
  Filled 2021-10-18 (×2): qty 0.5

## 2021-10-18 MED ORDER — IPRATROPIUM BROMIDE 0.02 % IN SOLN: 0.5000 mg | Freq: Four times a day (QID) | RESPIRATORY_TRACT | Status: DC

## 2021-10-18 MED ORDER — EZETIMIBE 10 MG PO TABS
10.0000 mg | ORAL_TABLET | Freq: Every day | ORAL | Status: DC
  Administered 2021-10-19 – 2021-10-22 (×4): 10 mg via ORAL
  Filled 2021-10-18 (×4): qty 1

## 2021-10-18 MED ORDER — ALUM & MAG HYDROXIDE-SIMETH 200-200-20 MG/5ML PO SUSP
15.0000 mL | Freq: Once | ORAL | Status: AC
Start: 2021-10-18 — End: 2021-10-18
  Administered 2021-10-18: 15 mL via ORAL
  Filled 2021-10-18: qty 30

## 2021-10-18 MED ORDER — LEVALBUTEROL HCL 0.63 MG/3ML IN NEBU: 0.6300 mg | INHALATION_SOLUTION | Freq: Four times a day (QID) | RESPIRATORY_TRACT | Status: DC

## 2021-10-18 MED ORDER — ATORVASTATIN CALCIUM 40 MG PO TABS
80.0000 mg | ORAL_TABLET | Freq: Every evening | ORAL | Status: DC
  Administered 2021-10-19 – 2021-10-22 (×4): 80 mg via ORAL
  Filled 2021-10-18 (×4): qty 2

## 2021-10-18 MED ORDER — IPRATROPIUM-ALBUTEROL 0.5-2.5 (3) MG/3ML IN SOLN
3.0000 mL | Freq: Once | RESPIRATORY_TRACT | Status: AC
  Administered 2021-10-18: 3 mL via RESPIRATORY_TRACT
  Filled 2021-10-18: qty 3

## 2021-10-18 MED ORDER — PANTOPRAZOLE SODIUM 40 MG PO TBEC
40.0000 mg | DELAYED_RELEASE_TABLET | Freq: Two times a day (BID) | ORAL | Status: DC
  Administered 2021-10-18 – 2021-10-22 (×9): 40 mg via ORAL
  Filled 2021-10-18 (×9): qty 1

## 2021-10-18 MED ORDER — METHYLPREDNISOLONE SODIUM SUCC 125 MG IJ SOLR
60.0000 mg | Freq: Every day | INTRAMUSCULAR | Status: DC
  Administered 2021-10-19: 60 mg via INTRAVENOUS
  Filled 2021-10-18: qty 2

## 2021-10-18 MED ORDER — ASCORBIC ACID 500 MG PO TABS
500.0000 mg | ORAL_TABLET | Freq: Two times a day (BID) | ORAL | Status: DC
  Administered 2021-10-18 – 2021-10-22 (×9): 500 mg via ORAL
  Filled 2021-10-18 (×9): qty 1

## 2021-10-18 NOTE — ED Notes (Signed)
Pt ambulated around the exam room multiple times. Pt's SpO2 maintained 96-100% RA with good waveform. Pt reports that he "Can't walk for more than 10 feet without getting winded."  ?

## 2021-10-18 NOTE — ED Triage Notes (Signed)
Pt bib [facility] from pulmonologist d/t shob.  Per EMS MD wanted pt to be evaluated for PNA as well as COPD exacerbation. Pt given 2.5 mg albuterol at doctors office. EMS gave as additional 5 mg of albuterol and 0.5 mg of atrovent.  ?

## 2021-10-18 NOTE — Assessment & Plan Note (Signed)
Creatinine is stable at 1.5. ?

## 2021-10-18 NOTE — Addendum Note (Signed)
Addended by: Vanessa Barbara on: 10/18/2021 02:53 PM ? ? Modules accepted: Orders ? ?

## 2021-10-18 NOTE — Assessment & Plan Note (Addendum)
Reports ongoing melena since December 2022.  Had small bowel capsule endoscopy on 06/2021 showing several nonbleeding AVMs, and mild gastritis and duodenitis.  He has been following with [facility] for the past several months but no planned scopes at this time. ?-Last hemoglobin 2 months ago around 11 and has downward trended to 8.8.  Threshold transfusion goal of hemoglobin <8 per outpatient cardiology.  Continue to monitor hemoglobin morning labs. ?-remains on Eliquis for hx of PE/DVT -unclear if this is recurrent since only found in imaging 2019 ?-Continuous management with GI outpatient ?

## 2021-10-18 NOTE — H&P (Addendum)
?History and Physical  ? ? ?Patient: Gregory Christensen VEL:[id number] DOB: 29-May-1951 ?DOA: 10/18/2021 ?DOS: the patient was seen and examined on 10/19/2021 ?PCP: Vernie Shanks, MD  ?Patient coming from: Home ? ?Chief Complaint:  ?Chief Complaint  ?Patient presents with  ? Shortness of Breath  ? ?HPI: Gregory Christensen is a 71 y.o. male with medical history significant of COPD with chronic hypoxia on 2 L with ongoing tobacco use, history of PE/DVT on Eliquis, GI bleed with AVMs, hypertension, CAD, and type 2 diabetes who presents with concerns of increasing shortness of breath and chest pain. ? ?Patient has been having 2 weeks of ongoing worsening shortness of breath with dyspnea.  Has cough productive of sputum.  Denies any fever.  Reports intermittent chest pain for the past 2 months both at rest and with exertion.  Worse with cough and breathing.  Took multiple doses of nitro this week and last week and reports immediate relief following nitro administration.  Has been following cardiology for ongoing chest pain and deemed poor candidate for procedural intervention. ? ?He presented to pulmonology with these symptoms today and was advised to present to the ED.  He presented to ED earlier today but declined admission.  Later returned for continual worsening shortness of breath. ? ?No leukocytosis.  Negative flu and COVID PCR.  Hemoglobin of 8.8Down from a prior of 11 although previous hemoglobins have been around 8.  No significant electrolyte abnormalities.  Creatinine at baseline 1.51. ? ?Chest x-ray shows emphysema but no acute findings. ? ?He was given IV continuous albuterol. ? ?Hospitalist then called for admission of COPD exacerbation. ? ?Review of Systems: As mentioned in the history of present illness. All other systems reviewed and are negative. ?Past Medical History:  ?Diagnosis Date  ? Acute deep vein thrombosis (DVT) of left lower extremity ([place])   ? Acute hypoxemic respiratory failure ([place])   ? Anemia   ? Iron  deficiency  ? Anxiety   ? Aortic atherosclerosis ([place])   ? Arthritis   ? Benign prostatic hyperplasia 08/28/2018  ? UNSPECIFIED WHETHER LOWER URINARY TRACT SYMPTOMS PRESENT  ? CAD (coronary artery disease)   ? Cancer [facility])   ? Chronic back pain   ? Chronic hip pain   ? CKD stage 3a 03/19/2021  ? Constipation   ? due to Iron M-W-F  ? COPD exacerbation ([place]) 10/18/2017  ? Dyspnea   ? Emphysema lung ([place])   ? Essential hypertension   ? GERD (gastroesophageal reflux disease)   ? Gout   ? Gross hematuria 09/08/2018  ? Head injury, acute, with loss of consciousness ([place])   ? unsure how long he was unresponsive  ? History of blood transfusion 05/2019  ? History of kidney stones   ? History of tobacco abuse   ? Hypoxia   ? Insomnia   ? Oxygen dependent   ? 2l- 24/7  ? Pneumonia   ? in 1980's  ? Pre-diabetes   ? Pulmonary embolism ([place]) 05/31/2018  ? Renal mass   ? CONCERNING FOR RENAL CELL CARCINOMA DR. Lovena Neighbours  ? Respiratory distress   ? Respiratory failure, acute-on-chronic ([place]) 08/03/2018  ? Seasonal allergies   ? Sleep apnea   ? ?Past Surgical History:  ?Procedure Laterality Date  ? BIOPSY  06/19/2021  ? Procedure: BIOPSY;  Surgeon: Wilford Corner, MD;  Location: [place];  Service: Endoscopy;;  ? COLONOSCOPY WITH PROPOFOL N/A 09/27/2019  ? Procedure: COLONOSCOPY WITH PROPOFOL;  Surgeon: Irene Shipper, MD;  Location: [facility];  Service: Endoscopy;  Laterality: N/A;  ? CYSTOSCOPY/URETEROSCOPY/HOLMIUM LASER/STENT PLACEMENT Left 10/31/2018  ? Procedure: CYSTOSCOPY/URETEROSCOPY;  Surgeon: Ceasar Mons, MD;  Location: [facility];  Service: Urology;  Laterality: Left;  ? ESOPHAGOGASTRODUODENOSCOPY (EGD) WITH PROPOFOL N/A 09/27/2019  ? Procedure: ESOPHAGOGASTRODUODENOSCOPY (EGD) WITH PROPOFOL;  Surgeon: Irene Shipper, MD;  Location: [facility];  Service: Endoscopy;  Laterality: N/A;  ? ESOPHAGOGASTRODUODENOSCOPY (EGD) WITH PROPOFOL N/A 06/19/2021  ? Procedure: ESOPHAGOGASTRODUODENOSCOPY (EGD) WITH PROPOFOL;   Surgeon: Wilford Corner, MD;  Location: [place];  Service: Endoscopy;  Laterality: N/A;  ? GIVENS CAPSULE STUDY N/A 06/20/2021  ? Procedure: GIVENS CAPSULE STUDY;  Surgeon: Wilford Corner, MD;  Location: [facility] ENDOSCOPY;  Service: Endoscopy;  Laterality: N/A;  ? HERNIA REPAIR Left 1977  ? inguinal  ? HYDROCELE EXCISION Left 05/13/2019  ? Procedure: HYDROCELECTOMY ADULT;  Surgeon: Ceasar Mons, MD;  Location: [facility];  Service: Urology;  Laterality: Left;  ? [place]  ? EXTENSIVE  ? POLYPECTOMY  09/27/2019  ? Procedure: POLYPECTOMY;  Surgeon: Irene Shipper, MD;  Location: [facility];  Service: Endoscopy;;  ? ROBOT ASSITED LAPAROSCOPIC NEPHROURETERECTOMY Left 10/31/2018  ? Procedure: XI ROBOT ASSITED LAPAROSCOPIC NEPHROURETERECTOMY;  Surgeon: Ceasar Mons, MD;  Location: [facility];  Service: Urology;  Laterality: Left;  ONLY NEEDS 240 MIN FOR ALL PROCEDURES  ? SHOULDER ARTHROSCOPY WITH ROTATOR CUFF REPAIR Left 04/12/2020  ? Procedure: LEFT SHOULDER ARTHROSCOPY, DEBRIDEMENT, BICEPS TENODESIS, MINI OPEN ROTATOR CUFF TEAR REPAIR;  Surgeon: Meredith Pel, MD;  Location: [place];  Service: Orthopedics;  Laterality: Left;  ? ?Social History:  reports that he quit smoking about 5 months ago. His smoking use included cigarettes. He has a 28.50 pack-year smoking history. He has never used smokeless tobacco. He reports that he does not currently use alcohol after a past usage of about 10.0 standard drinks per week. He reports that he does not currently use drugs after having used the following drugs: Flunitrazepam. ? ?No Known Allergies ? ?Family History  ?Problem Relation Age of Onset  ? Diabetes Mother   ? Hypertension Sister   ? Hypertension Brother   ? ? ?Prior to Admission medications   ?Medication Sig Start Date End Date Taking? Authorizing Provider  ?albuterol (PROVENTIL) (2.5 MG/3ML) 0.083% nebulizer solution Take 3 mLs (2.5 mg total) by nebulization every 6 (six) hours as  needed for wheezing or shortness of breath. 08/04/18   Reyne Dumas, MD  ?albuterol (PROVENTIL) (2.5 MG/3ML) 0.083% nebulizer solution Take 3 mLs (2.5 mg total) by nebulization every 6 (six) hours as needed for wheezing or shortness of breath. 10/18/21   Parrett, Fonnie Mu, NP  ?albuterol (VENTOLIN HFA) 108 (90 Base) MCG/ACT inhaler Inhale 2 puffs into the lungs every 2 (two) hours as needed for wheezing or shortness of breath. 04/13/21   Freddi Starr, MD  ?amoxicillin-clavulanate (AUGMENTIN) 875-125 MG tablet Take 1 tablet by mouth every 12 (twelve) hours. 10/18/21   Lucrezia Starch, MD  ?apixaban (ELIQUIS) 5 MG TABS tablet Take 1 tablet (5 mg total) by mouth 2 (two) times daily. 06/10/18   Regalado, Belkys A, MD  ?ascorbic acid (VITAMIN C) 500 MG tablet in the morning and at bedtime. 05/10/21   [provider]  ?atorvastatin (LIPITOR) 80 MG tablet Take 1 tablet (80 mg total) by mouth daily. 05/12/21   Chandrasekhar, Lyda Kalata A, MD  ?Dextran 70-Hypromellose (ARTIFICIAL TEARS) 0.1-0.3 % SOLN Place 1 drop into both eyes daily as needed (dry  eyes).    [provider]  ?diclofenac Sodium (VOLTAREN) 1 % GEL Apply 2 g topically 2 (two) times daily as needed (Pain).    [provider]  ?docusate sodium (COLACE) 100 MG capsule Take 100 mg by mouth daily as needed for mild constipation.    [provider]  ?ezetimibe (ZETIA) 10 MG tablet Take 1 tablet (10 mg total) by mouth daily. 08/18/21   Werner Lean, MD  ?ferrous sulfate 325 (65 FE) MG tablet Take 1 tablet (325 mg total) by mouth daily. 06/21/21 06/21/22  Shelly Coss, MD  ?fluticasone (FLONASE) 50 MCG/ACT nasal spray Place into both nostrils as needed for allergies or rhinitis.    [provider]  ?fluticasone-salmeterol (ADVAIR) 250-50 MCG/ACT AEPB Inhale 1 puff into the lungs in the morning and at bedtime.    [provider]  ?gabapentin (NEURONTIN) 300 MG capsule Take 300 mg by mouth 3 (three)  times daily.    [provider]  ?levocetirizine (XYZAL) 5 MG tablet Take 5 mg by mouth as needed.    [provider]  ?lidocaine (LIDODERM) 5 % Place 1 patch onto the skin every 12 (twelve

## 2021-10-18 NOTE — ED Notes (Signed)
Pt c/o midsternal chest pain, non radiating x20 mins. Pt states that this pain feels like a burning sensation and mylanta helped him last time he had pain like this. Pt also c/o headache. Dr Flossie Buffy notified via secure chat. ?

## 2021-10-18 NOTE — ED Provider Notes (Signed)
?[place] [facility] DEPT ?Provider Note ? ? ?CSN: [id number] ?Arrival date & time: 10/18/21  2024 ? ?  ? ?History ? ?Chief Complaint  ?Patient presents with  ? Shortness of Breath  ? ? ?Gregory Christensen is a 71 y.o. male with a history of COPD on 2 L nasal cannula baseline presenting to emergency department with shortness of breath.  The patient was seen in the emergency department earlier today and discharged home after breathing treatments with concern for COPD exacerbation and questionable pneumonia versus pulmonary nodule.  Patient was given breathing treatments and steroids and insisted on going home at the time, though he was recommended for admission.  He reports shortly after arriving home his shortness of breath got significantly worse, and he prompted a call to the EMS coming back to the ER.  He took prednisone 60 mg at home in addition to solumedrol given today in ED. ? ?HPI ? ?  ? ?Home Medications ?Prior to Admission medications   ?Medication Sig Start Date End Date Taking? Authorizing Provider  ?albuterol (PROVENTIL) (2.5 MG/3ML) 0.083% nebulizer solution Take 3 mLs (2.5 mg total) by nebulization every 6 (six) hours as needed for wheezing or shortness of breath. 10/18/21  Yes Parrett, Tammy S, NP  ?albuterol (VENTOLIN HFA) 108 (90 Base) MCG/ACT inhaler Inhale 2 puffs into the lungs every 2 (two) hours as needed for wheezing or shortness of breath. 04/13/21  Yes Freddi Starr, MD  ?amoxicillin-clavulanate (AUGMENTIN) 875-125 MG tablet Take 1 tablet by mouth every 12 (twelve) hours. 10/18/21  Yes Lucrezia Starch, MD  ?apixaban (ELIQUIS) 5 MG TABS tablet Take 1 tablet (5 mg total) by mouth 2 (two) times daily. 06/10/18  Yes Regalado, Belkys A, MD  ?ascorbic acid (VITAMIN C) 500 MG tablet Take 500 mg by mouth in the morning and at bedtime. 05/10/21  Yes [provider]  ?atorvastatin (LIPITOR) 40 MG tablet Take 80 mg by mouth every evening.   Yes [provider]   ?Dextran 70-Hypromellose (ARTIFICIAL TEARS) 0.1-0.3 % SOLN Place 1 drop into both eyes daily as needed (dry eyes).   Yes [provider]  ?diclofenac Sodium (VOLTAREN) 1 % GEL Apply 2 g topically 2 (two) times daily as needed (Pain).   Yes [provider]  ?docusate sodium (COLACE) 100 MG capsule Take 100 mg by mouth daily as needed for mild constipation.   Yes [provider]  ?ezetimibe (ZETIA) 10 MG tablet Take 1 tablet (10 mg total) by mouth daily. 08/18/21  Yes Chandrasekhar, Mahesh A, MD  ?ferrous sulfate 325 (65 FE) MG tablet Take 1 tablet (325 mg total) by mouth daily. 06/21/21 06/21/22 Yes Shelly Coss, MD  ?finasteride (PROSCAR) 5 MG tablet Take 5 mg by mouth daily. 09/28/21  Yes [provider]  ?fluticasone (FLONASE) 50 MCG/ACT nasal spray Place into both nostrils as needed for allergies or rhinitis.   Yes [provider]  ?fluticasone-salmeterol (ADVAIR) 250-50 MCG/ACT AEPB Inhale 1 puff into the lungs in the morning and at bedtime.   Yes [provider]  ?gabapentin (NEURONTIN) 300 MG capsule Take 300 mg by mouth 3 (three) times daily.   Yes [provider]  ?levocetirizine (XYZAL) 5 MG tablet Take 5 mg by mouth daily as needed for allergies.   Yes [provider]  ?lidocaine (LIDODERM) 5 % Place 1 patch onto the skin every 12 (twelve) hours as needed (pain).   Yes [provider]  ?methenamine (HIPREX) 1 g tablet Take 1  g by mouth in the morning and at bedtime. 05/10/21  Yes [provider]  ?montelukast (SINGULAIR) 10 MG tablet Take 10 mg by mouth at bedtime.   Yes [provider]  ?nitroGLYCERIN (NITROSTAT) 0.4 MG SL tablet Take up to 3 tablets as needed for chest pain. ?Patient taking differently: 0.4 mg every 5 (five) minutes as needed for chest pain. Take up to 3 tablets as needed for chest pain. 05/17/21  Yes Chandrasekhar, Mahesh A, MD  ?pantoprazole (PROTONIX) 40 MG tablet Take 1 tablet (40 mg  total) by mouth daily. ?Patient taking differently: Take 40 mg by mouth 2 (two) times daily. 06/21/21 06/21/22 Yes Shelly Coss, MD  ?predniSONE (STERAPRED UNI-PAK 21 TAB) 10 MG (21) TBPK tablet Take by mouth daily. Take 6 tabs by mouth daily  for 1 day, then 5 tabs for 1 day, then 4 tabs for 1 day, then 3 tabs for 1 day, 2 tabs for 1 day, then 1 tab by mouth daily for 1 day 10/18/21  Yes Dykstra, Ellwood Dense, MD  ?sildenafil (VIAGRA) 100 MG tablet Take 100 mg by mouth daily as needed for erectile dysfunction. 01/25/20  Yes [provider]  ?tamsulosin (FLOMAX) 0.4 MG CAPS capsule Take 0.4 mg by mouth at bedtime.   Yes [provider]  ?Tiotropium Bromide Monohydrate (SPIRIVA RESPIMAT) 2.5 MCG/ACT AERS Inhale 2 puffs into the lungs daily.   Yes [provider]  ?albuterol (PROVENTIL) (2.5 MG/3ML) 0.083% nebulizer solution Take 3 mLs (2.5 mg total) by nebulization every 6 (six) hours as needed for wheezing or shortness of breath. ?Patient not taking: Reported on 10/18/2021 08/04/18   Reyne Dumas, MD  ?atorvastatin (LIPITOR) 80 MG tablet Take 1 tablet (80 mg total) by mouth daily. 05/12/21   Werner Lean, MD  ?   ? ?Allergies    ?Patient has no known allergies.   ? ?Review of Systems   ?Review of Systems ? ?Physical Exam ?Updated Vital Signs ?BP 90/62   Pulse 94   Temp 98.2 ?F (36.8 ?C) (Oral)   Resp (!) 22   Ht '6\' 1"'$  (1.854 m)   Wt 72.6 kg   SpO2 100%   BMI 21.11 kg/m?  ?Physical Exam ?Constitutional:   ?   General: He is not in acute distress. ?HENT:  ?   Head: Normocephalic and atraumatic.  ?Eyes:  ?   Conjunctiva/sclera: Conjunctivae normal.  ?   Pupils: Pupils are equal, round, and reactive to light.  ?Cardiovascular:  ?   Rate and Rhythm: Normal rate and regular rhythm.  ?Pulmonary:  ?   Effort: Pulmonary effort is normal. No respiratory distress.  ?   Breath sounds: Wheezing present.  ?Abdominal:  ?   General: There is no distension.  ?   Tenderness: There is no  abdominal tenderness.  ?Skin: ?   General: Skin is warm and dry.  ?Neurological:  ?   General: No focal deficit present.  ?   Mental Status: He is alert. Mental status is at baseline.  ?Psychiatric:     ?   Mood and Affect: Mood normal.     ?   Behavior: Behavior normal.  ? ? ?ED Results / Procedures / Treatments   ?Labs ?(all labs ordered are listed, but only abnormal results are displayed) ?Labs Reviewed  ?CBC  ?TROPONIN I (HIGH SENSITIVITY)  ? ? ?EKG ?EKG Interpretation ? ?Date/Time:  Wednesday October 18 2021 20:59:37 EDT ?Ventricular Rate:  94 ?PR Interval:  151 ?QRS Duration:  90 ?QT Interval:  375 ?QTC Calculation: 469 ?R Axis:   42 ?Text Interpretation: Sinus rhythm Borderline repolarization abnormality Confirmed by Octaviano Glow [phone]) on 10/18/2021 9:08:55 PM ? ?Radiology ?DG Chest 2 View ? ?Result Date: 10/18/2021 ?CLINICAL DATA:  Short of breath, COPD EXAM: CHEST - 2 VIEW COMPARISON:  10/18/2021 at 12:32 p.m. FINDINGS: Frontal and lateral views of the chest demonstrate a stable cardiac silhouette. Background emphysema without airspace disease, effusion, or pneumothorax. No acute bony abnormalities. IMPRESSION: 1. Emphysema.  No acute airspace disease. Electronically Signed   By: Randa Ngo M.D.   On: 10/18/2021 20:57  ? ?DG Chest 2 View ? ?Result Date: 10/18/2021 ?CLINICAL DATA:  COPD exacerbation. EXAM: CHEST - 2 VIEW COMPARISON:  06/17/2021 FINDINGS: The heart size and mediastinal contours are within normal limits. Pulmonary hyperinflation and coarsening of interstitial lung markings is again seen, consistent with COPD. No evidence of pulmonary consolidation or pleural effusion. A small nodular opacity is seen in the right upper lobe on the PA view, which was not seen on previous study. IMPRESSION: COPD. Small nodular opacity in right upper lobe. Chest CT without contrast is recommended to exclude pulmonary nodule. Electronically Signed   By: Marlaine Hind M.D.   On: 10/18/2021 11:15  ? ?DG Chest Port  1 View ? ?Result Date: 10/18/2021 ?CLINICAL DATA:  Shortness of breath. Cough. Chest pain. Right lung nodule on recent chest radiograph. EXAM: PORTABLE CHEST 1 VIEW COMPARISON:  Prior study today and 06/18/2019 FINDING

## 2021-10-18 NOTE — ED Provider Notes (Signed)
?[facility] ?Provider Note ? ? ?CSN: [id number] ?Arrival date & time: 10/18/21  1223 ? ?  ? ?History ? ?Chief Complaint  ?Patient presents with  ? Shortness of Breath  ? ? ?Gregory Christensen is a 71 y.o. male.  Presented to the emergency department with concern for shortness of breath. ? ?Has past medical history of COPD, chronic respiratory failure on 2 L edema, DVT/PE, OSA, CKD. ? ?Seen by pulmonologist this morning.  He was reporting worsening shortness of breath and cough over the past couple weeks.  Cough is mostly dry nonproductive.  Shortness of breath worse with exertion.  States that he had a mild episode of chest discomfort this morning.  Brief, resolved.  No ongoing pain at this time. ? ?Reviewed note from pulmonology office.  They are concerned for COPD exacerbation, possible pneumonia and advised going to ER for further evaluation and management. ? ?HPI ? ?  ? ?Home Medications ?Prior to Admission medications   ?Medication Sig Start Date End Date Taking? Authorizing Provider  ?amoxicillin-clavulanate (AUGMENTIN) 875-125 MG tablet Take 1 tablet by mouth every 12 (twelve) hours. 10/18/21  Yes Yeslin Delio, Ellwood Dense, MD  ?predniSONE (STERAPRED UNI-PAK 21 TAB) 10 MG (21) TBPK tablet Take by mouth daily. Take 6 tabs by mouth daily  for 1 day, then 5 tabs for 1 day, then 4 tabs for 1 day, then 3 tabs for 1 day, 2 tabs for 1 day, then 1 tab by mouth daily for 1 day 10/18/21  Yes Levada Bowersox, Ellwood Dense, MD  ?albuterol (PROVENTIL) (2.5 MG/3ML) 0.083% nebulizer solution Take 3 mLs (2.5 mg total) by nebulization every 6 (six) hours as needed for wheezing or shortness of breath. 08/04/18   Reyne Dumas, MD  ?albuterol (PROVENTIL) (2.5 MG/3ML) 0.083% nebulizer solution Take 3 mLs (2.5 mg total) by nebulization every 6 (six) hours as needed for wheezing or shortness of breath. 10/18/21   Parrett, Fonnie Mu, NP  ?albuterol (VENTOLIN HFA) 108 (90 Base) MCG/ACT inhaler Inhale 2 puffs into the lungs every  2 (two) hours as needed for wheezing or shortness of breath. 04/13/21   Freddi Starr, MD  ?apixaban (ELIQUIS) 5 MG TABS tablet Take 1 tablet (5 mg total) by mouth 2 (two) times daily. 06/10/18   Regalado, Belkys A, MD  ?ascorbic acid (VITAMIN C) 500 MG tablet in the morning and at bedtime. 05/10/21   [provider]  ?atorvastatin (LIPITOR) 80 MG tablet Take 1 tablet (80 mg total) by mouth daily. 05/12/21   Chandrasekhar, Lyda Kalata A, MD  ?Dextran 70-Hypromellose (ARTIFICIAL TEARS) 0.1-0.3 % SOLN Place 1 drop into both eyes daily as needed (dry eyes).    [provider]  ?diclofenac Sodium (VOLTAREN) 1 % GEL Apply 2 g topically 2 (two) times daily as needed (Pain).    [provider]  ?docusate sodium (COLACE) 100 MG capsule Take 100 mg by mouth daily as needed for mild constipation.    [provider]  ?ezetimibe (ZETIA) 10 MG tablet Take 1 tablet (10 mg total) by mouth daily. 08/18/21   Werner Lean, MD  ?ferrous sulfate 325 (65 FE) MG tablet Take 1 tablet (325 mg total) by mouth daily. 06/21/21 06/21/22  Shelly Coss, MD  ?fluticasone (FLONASE) 50 MCG/ACT nasal spray Place into both nostrils as needed for allergies or rhinitis.    [provider]  ?fluticasone-salmeterol (ADVAIR) 250-50 MCG/ACT AEPB Inhale 1 puff into the lungs in the morning and at bedtime.    [provider]  ?gabapentin (NEURONTIN) 300 MG capsule Take 300 mg by mouth 3 (three) times daily.    [provider]  ?levocetirizine (XYZAL) 5 MG tablet Take 5 mg by mouth as needed.    [provider]  ?lidocaine (LIDODERM) 5 % Place 1 patch onto the skin every 12 (twelve) hours as needed (pain).    [provider]  ?methenamine (HIPREX) 1 g tablet in the morning and at bedtime. 05/10/21   [provider]  ?montelukast (SINGULAIR) 10 MG tablet Take 10 mg by mouth at bedtime.    [provider]  ?nitroGLYCERIN (NITROSTAT) 0.4 MG SL tablet  Take up to 3 tablets as needed for chest pain. ?Patient not taking: Reported on 10/18/2021 05/17/21   Werner Lean, MD  ?pantoprazole (PROTONIX) 40 MG tablet Take 1 tablet (40 mg total) by mouth daily. 06/21/21 06/21/22  Shelly Coss, MD  ?sildenafil (VIAGRA) 100 MG tablet Take 100 mg by mouth daily as needed for erectile dysfunction. ?Patient not taking: Reported on 10/18/2021 01/25/20   [provider]  ?tamsulosin (FLOMAX) 0.4 MG CAPS capsule Take 0.4 mg by mouth daily.    [provider]  ?Tiotropium Bromide Monohydrate (SPIRIVA RESPIMAT) 2.5 MCG/ACT AERS Inhale 2 puffs into the lungs daily.    [provider]  ?   ? ?Allergies    ?Patient has no known allergies.   ? ?Review of Systems   ?Review of Systems  ?Constitutional:  Positive for fatigue. Negative for chills and fever.  ?HENT:  Negative for ear pain and sore throat.   ?Eyes:  Negative for pain and visual disturbance.  ?Respiratory:  Positive for cough and shortness of breath.   ?Cardiovascular:  Negative for chest pain and palpitations.  ?Gastrointestinal:  Negative for abdominal pain and vomiting.  ?Genitourinary:  Negative for dysuria and hematuria.  ?Musculoskeletal:  Negative for arthralgias and back pain.  ?Skin:  Negative for color change and rash.  ?Neurological:  Negative for seizures and syncope.  ?All other systems reviewed and are negative. ? ?Physical Exam ?Updated Vital Signs ?BP (!) 152/75   Pulse 73   Temp 97.6 ?F (36.4 ?C) (Oral)   Resp 18   SpO2 100%  ?Physical Exam ?Vitals and nursing note reviewed.  ?Constitutional:   ?   General: He is not in acute distress. ?   Appearance: He is well-developed.  ?HENT:  ?   Head: Normocephalic and atraumatic.  ?Eyes:  ?   Conjunctiva/sclera: Conjunctivae normal.  ?Cardiovascular:  ?   Rate and Rhythm: Normal rate and regular rhythm.  ?   Heart sounds: No murmur heard. ?Pulmonary:  ?   Comments: Bilateral expiratory wheezing noted, patient able to speak in  full sentences ?Abdominal:  ?   Palpations: Abdomen is soft.  ?   Tenderness: There is no abdominal tenderness.  ?Musculoskeletal:     ?   General: No swelling.  ?   Cervical back: Neck supple.  ?Skin: ?   General: Skin is warm and dry.  ?   Capillary Refill: Capillary refill takes less than 2 seconds.  ?Neurological:  ?   Mental Status: He is alert.  ?Psychiatric:     ?   Mood and Affect: Mood normal.  ? ? ?ED Results / Procedures / Treatments   ?Labs ?(all labs ordered are listed, but only abnormal results are displayed) ?Labs Reviewed  ?BASIC METABOLIC PANEL - Abnormal; Notable for the following components:  ?    Result  Value  ? CO2 21 (*)   ? Glucose, Bld 123 (*)   ? Creatinine, Ser 1.51 (*)   ? Calcium 8.6 (*)   ? GFR, Estimated 49 (*)   ? All other components within normal limits  ?CBC WITH DIFFERENTIAL/PLATELET - Abnormal; Notable for the following components:  ? RBC 3.59 (*)   ? Hemoglobin 8.8 (*)   ? HCT 31.2 (*)   ? MCH 24.5 (*)   ? MCHC 28.2 (*)   ? RDW 18.6 (*)   ? All other components within normal limits  ?RESP PANEL BY RT-PCR (FLU A&B, COVID) ARPGX2  ?BRAIN NATRIURETIC PEPTIDE  ?TROPONIN I (HIGH SENSITIVITY)  ?TROPONIN I (HIGH SENSITIVITY)  ? ? ?EKG ?EKG Interpretation ? ?Date/Time:  Wednesday October 18 2021 12:43:21 EDT ?Ventricular Rate:  76 ?PR Interval:  153 ?QRS Duration: 88 ?QT Interval:  407 ?QTC Calculation: 458 ?R Axis:   48 ?Text Interpretation: Sinus rhythm Borderline low voltage, extremity leads Nonspecific T abnrm, anterolateral leads Confirmed by Madalyn Rob [phone]) on 10/18/2021 3:32:26 PM ? ?Radiology ?DG Chest 2 View ? ?Result Date: 10/18/2021 ?CLINICAL DATA:  COPD exacerbation. EXAM: CHEST - 2 VIEW COMPARISON:  06/17/2021 FINDINGS: The heart size and mediastinal contours are within normal limits. Pulmonary hyperinflation and coarsening of interstitial lung markings is again seen, consistent with COPD. No evidence of pulmonary consolidation or pleural effusion. A small nodular  opacity is seen in the right upper lobe on the PA view, which was not seen on previous study. IMPRESSION: COPD. Small nodular opacity in right upper lobe. Chest CT without contrast is recommended to exclude pulmona

## 2021-10-18 NOTE — ED Notes (Signed)
I provided reinforced discharge education based off of discharge instructions. Pt acknowledged and understood my education. Pt had no further questions/concerns for provider/myself.  °

## 2021-10-18 NOTE — Assessment & Plan Note (Addendum)
Has ongoing chest pain for the past 2 months. ?He was seen by cardiology in February and deemed a poor candidate for procedural intervention given his bleeding issues, CKD and ongoing tobacco use.  He had Norvasc discontinued given hypotension.  ?-check stat Troponin ?-placed 0.5 nitro ointment but had persistent pain. Trial of low dose Imdur and monitor BP closely.  ?-consider cardiology consult tomorrow if pain persist ?

## 2021-10-18 NOTE — Assessment & Plan Note (Signed)
Patient has a history of anemia.  Currently undergoing work-up with GI.  Patient has black stools however is on iron.  Patient is recently had lab work last week with GI.  Results were not available.  Will need further evaluation as he is presyncopal since to rule out symptomatic anemia. ?

## 2021-10-18 NOTE — Assessment & Plan Note (Signed)
Baseline on 2L and presents with respiratory distress secondary to COPD exacerbation ?Maintain O2 88-92% ?Continue daily IV Solu-Medrol 60 mg ?Scheduled ipratropium-Xopenex q6hr  ?Start IV Rocephin ?

## 2021-10-18 NOTE — Progress Notes (Signed)
? ?'@Patient'$  ID: Gregory Christensen, male    DOB: 1950-07-04, 71 y.o.   MRN: [id number] ? ?Chief Complaint  ?Patient presents with  ? Follow-up  ? ? ?Referring provider: ?Vernie Shanks, MD ? ?HPI: ?71 year old male former smoker followed for COPD and chronic respiratory failure on oxygen ?Medical history significant for DVT, PE, sleep apnea and chronic kidney disease ?Followed at the [facility] ? ? ?TEST/EVENTS :  ?Pulmonary function test from 2016 showed an FEV1 of 1.06 L (33%), ratio 51, DLCO 51%.  Alpha 1 antitrypsin level in 2019 was normal.  He was referred to pulmonary rehab in 08/2019 but did not attend. ? ?2019: Alpha 1 Antitrypsin phenotype is MM ?  ?Echo 03/20/21: ?LV EF 60-65%. RV systolic function is low normal. RV size is mildly enlarged. ? ?10/18/2021 Follow up ; COPD , O2 RF  ?Patient presents for a 3-monthfollow-up.  Patient has underlying severe COPD with emphysema and chronic respiratory failure on oxygen at 2 L.  He is maintained on Advair  twice daily and Spiriva daily. ?Last visit patient was referred to the lung cancer screening program. This has not been set up .  ?Complains that over 2 weeks of increased wheezing , dry cough , shortness of breath.  Low grade fever on and off. Says he feels week with no energy and lightheaded.  Patient complains that every time he stands up he feels like he is going to pass out.  Today in office patient says he feels terrible just cannot go any longer.  Patient has significant wheezing on arrival.  He was given albuterol nebulizer treatment.  COVID testing in the office was negative.  Chest x-ray in the office today shows COPD changes and a small nodular opacity in the right upper lobe.  ? ?Has chronic anemia , followed by GI . Remains on Iron. Hemoglobin has been lower, recently had labs results pending . Denies bloody stools.  But says his stools are black.  ? ?History of PE on Eliquis .  ? ? ? ? ?No Known Allergies ? ?Immunization History  ?Administered Date(s)  Administered  ? Fluad Quad(high Dose 65+) 03/21/2020  ? Influenza, High Dose Seasonal PF 06/10/2018  ? Influenza,inj,Quad PF,6+ Mos 06/05/2016, 03/28/2021  ? Influenza,inj,quad, With Preservative 03/30/2019  ? Influenza-Unspecified 03/30/2013, 04/29/2014, 05/11/2015, 04/06/2019, 03/28/2021  ? Janssen (J&J) SARS-COV-2 Vaccination 10/05/2019  ? Moderna Sars-Covid-2 Vaccination 08/08/2020, 02/21/2021  ? Pneumococcal Conjugate-13 12/03/2017  ? Pneumococcal Polysaccharide-23 10/23/2013, 12/05/2018  ? Tdap 03/03/2011, 12/03/2017  ? Zoster Recombinat (Shingrix) 04/05/2020, 07/19/2020  ? ? ?Past Medical History:  ?Diagnosis Date  ? Acute deep vein thrombosis (DVT) of left lower extremity ([facility]   ? Acute hypoxemic respiratory failure ([facility]   ? Anemia   ? Iron deficiency  ? Anxiety   ? Aortic atherosclerosis ([facility]   ? Arthritis   ? Benign prostatic hyperplasia 08/28/2018  ? UNSPECIFIED WHETHER LOWER URINARY TRACT SYMPTOMS PRESENT  ? CAD (coronary artery disease)   ? Cancer ([facility]   ? Chronic back pain   ? Chronic hip pain   ? CKD stage 3a 03/19/2021  ? Constipation   ? due to Iron M-W-F  ? COPD exacerbation ([facility] 10/18/2017  ? Dyspnea   ? Emphysema lung ([facility]   ? Essential hypertension   ? GERD (gastroesophageal reflux disease)   ? Gout   ? Gross hematuria 09/08/2018  ? Head injury, acute, with loss of consciousness ([facility]   ? unsure how long he was unresponsive  ?  History of blood transfusion 05/2019  ? History of kidney stones   ? History of tobacco abuse   ? Hypoxia   ? Insomnia   ? Oxygen dependent   ? 2l- 24/7  ? Pneumonia   ? in 1980's  ? Pre-diabetes   ? Pulmonary embolism ([place]) 05/31/2018  ? Renal mass   ? CONCERNING FOR RENAL CELL CARCINOMA DR. Lovena Neighbours  ? Respiratory distress   ? Respiratory failure, acute-on-chronic ([place]) 08/03/2018  ? Seasonal allergies   ? Sleep apnea   ? ? ?Tobacco History: ?Social History  ? ?Tobacco Use  ?Smoking Status Former  ? Packs/day: 0.50  ? Years: 57.00  ? Pack years: 28.50  ? Types:  Cigarettes  ? Quit date: 05/02/2021  ? Years since quitting: 0.4  ?Smokeless Tobacco Never  ? ?Counseling given: Not Answered ? ? ?Outpatient Medications Prior to Visit  ?Medication Sig Dispense Refill  ? albuterol (PROVENTIL) (2.5 MG/3ML) 0.083% nebulizer solution Take 3 mLs (2.5 mg total) by nebulization every 6 (six) hours as needed for wheezing or shortness of breath. 75 mL 12  ? albuterol (VENTOLIN HFA) 108 (90 Base) MCG/ACT inhaler Inhale 2 puffs into the lungs every 2 (two) hours as needed for wheezing or shortness of breath. 1 each 2  ? apixaban (ELIQUIS) 5 MG TABS tablet Take 1 tablet (5 mg total) by mouth 2 (two) times daily. 60 tablet 1  ? ascorbic acid (VITAMIN C) 500 MG tablet in the morning and at bedtime.    ? atorvastatin (LIPITOR) 80 MG tablet Take 1 tablet (80 mg total) by mouth daily. 90 tablet 3  ? Dextran 70-Hypromellose (ARTIFICIAL TEARS) 0.1-0.3 % SOLN Place 1 drop into both eyes daily as needed (dry eyes).    ? diclofenac Sodium (VOLTAREN) 1 % GEL Apply 2 g topically 2 (two) times daily as needed (Pain).    ? docusate sodium (COLACE) 100 MG capsule Take 100 mg by mouth daily as needed for mild constipation.    ? ezetimibe (ZETIA) 10 MG tablet Take 1 tablet (10 mg total) by mouth daily. 90 tablet 3  ? ferrous sulfate 325 (65 FE) MG tablet Take 1 tablet (325 mg total) by mouth daily. 30 tablet 1  ? fluticasone (FLONASE) 50 MCG/ACT nasal spray Place into both nostrils as needed for allergies or rhinitis.    ? fluticasone-salmeterol (ADVAIR) 250-50 MCG/ACT AEPB Inhale 1 puff into the lungs in the morning and at bedtime.    ? gabapentin (NEURONTIN) 300 MG capsule Take 300 mg by mouth 3 (three) times daily.    ? levocetirizine (XYZAL) 5 MG tablet Take 5 mg by mouth as needed.    ? lidocaine (LIDODERM) 5 % Place 1 patch onto the skin every 12 (twelve) hours as needed (pain).    ? methenamine (HIPREX) 1 g tablet in the morning and at bedtime.    ? montelukast (SINGULAIR) 10 MG tablet Take 10 mg by  mouth at bedtime.    ? pantoprazole (PROTONIX) 40 MG tablet Take 1 tablet (40 mg total) by mouth daily. 60 tablet 0  ? tamsulosin (FLOMAX) 0.4 MG CAPS capsule Take 0.4 mg by mouth daily.    ? Tiotropium Bromide Monohydrate (SPIRIVA RESPIMAT) 2.5 MCG/ACT AERS Inhale 2 puffs into the lungs daily.    ? nitroGLYCERIN (NITROSTAT) 0.4 MG SL tablet Take up to 3 tablets as needed for chest pain. (Patient not taking: Reported on 10/18/2021) 25 tablet 3  ? sildenafil (VIAGRA) 100 MG tablet Take 100  mg by mouth daily as needed for erectile dysfunction. (Patient not taking: Reported on 10/18/2021)    ? ?No facility-administered medications prior to visit.  ? ? ? ?Review of Systems:  ? ?Constitutional:   No  weight loss, night sweats,   ?+Fevers, chills, fatigue, or  lassitude. ? ?HEENT:   No headaches,  Difficulty swallowing,  Tooth/dental problems, or  Sore throat,  ?              No sneezing, itching, ear ache, nasal congestion, post nasal drip,  ? ?CV:  No chest pain,  Orthopnea, PND, swelling in lower extremities, anasarca, dizziness, palpitations, syncope.  ? ?GI  No heartburn, indigestion, abdominal pain, nausea, vomiting, diarrhea, change in bowel habits, loss of appetite, bloody stools.  ? ?Resp:   No chest wall deformity ? ?Skin: no rash or lesions. ? ?GU: no dysuria, change in color of urine, no urgency or frequency.  No flank pain, no hematuria  ? ?MS:  No joint pain or swelling.  No decreased range of motion.  No back pain. ? ? ? ?Physical Exam ? ?BP (!) 120/50 (BP Location: Left Arm, Patient Position: Sitting, Cuff Size: Normal)   Pulse 95   Temp (!) 97.5 ?F (36.4 ?C) (Oral)   Ht '6\' 1"'$  (1.854 m)   Wt 160 lb 12.8 oz (72.9 kg)   SpO2 100%   BMI 21.22 kg/m?  ? ?GEN: A/Ox3; pleasant , NAD, thin, cachectic on oxygen ?  ?HEENT:  [place]/AT,  NOSE-clear, THROAT-clear, no lesions, no postnasal drip or exudate noted.  ? ?NECK:  Supple w/ fair ROM; no JVD; normal carotid impulses w/o bruits; no thyromegaly or nodules  palpated; no lymphadenopathy.   ? ?RESP scattered rhonchi bilaterally with expiratory wheezes + accessory muscle use, no dullness to percussion ? ?CARD:  RRR, no m/r/g, no peripheral edema, pulses intact, no cyanosis or clubbing.

## 2021-10-18 NOTE — Assessment & Plan Note (Addendum)
Acute COPD exacerbation.  Patient has significant symptom burden with associated presyncopal episodes.  Today in the office patient with mild respiratory distress.  He was given albuterol nebulizer.  Chest x-ray does show a right upper lobe nodularity.  Possibly underlying pneumonia.  Patient is high risk for decompensation.  Will need higher level care and will transfer to emergency room for evaluation and consideration of hospital admission.  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 ?COVID nasal swab in office negative for COVID ? ?Plan  ?Transfer to the emergency room via EMS ?

## 2021-10-18 NOTE — Discharge Instructions (Addendum)
We offered admission for your COPD exacerbation but you have declined.  If you you change your mind at any time or you have any increased work of breathing, chest pain or other new concerning symptom, please return to the emergency room for reassessment. ? ?Take the antibiotic as prescribed for possible pneumonia. ? ?Please follow-up with your primary care doctor and your pulmonologist. ?

## 2021-10-18 NOTE — Assessment & Plan Note (Signed)
History of PE-remains on Eliquis. ?

## 2021-10-18 NOTE — Assessment & Plan Note (Signed)
Continue on oxygen to maintain O2 saturations greater than 88 to 90%. 

## 2021-10-18 NOTE — Telephone Encounter (Signed)
Patient was seen in the office, new neb machine was ordered.  He was sent to the ED for evaluation after having CXR and nebulizer tx in the office.  I will go ahead and have this order for the neb machine sent to [facility].  They are also requesting neb meds:  albuterol, performist and Budesonide however I do not see that he is on Performist or Budesonide at this time.  Currently, he is on Albuterol nebs, Advair and spiriva resimat.  I will have his OV note faxed to them as well as the script for the albuterol neb solution. ?

## 2021-10-18 NOTE — ED Triage Notes (Signed)
Pt from home via EMS c/o SOB. Pt was seen here today and diagnosed with COPD exacerbation and possible pneumonia at that time. Pt was offered admission, but opted to be discharged home. Pt was home for approx 3 hours before he called 911 to return due to increasing symptoms. Pt wears 2L O2 at home at all times. EMS reports pt's SpO2 at 96% upon their arrival, very diminished/absent breath sounds to L lung. Pt received 1 albuterol and 1 duoneb treatment prior to arrival. Pt has expiratory wheezing throughout, but states his SOB is slightly better than prior to the treatments. EMS reports that pt appears in less distress upon arrival as well. Pt also c/o 7/10 headache upon arrival - denies injury/trauma. Pt took 1 dose of PO antibiotic and prednisone at home prior to arrival. Hx of DVT and PE, taking Eliquis as prescribed. States his hgb was low the past few weeks, states that he has had some black stools since December, but it's been loose x3 days. Pt also c/o dizziness/lightheadedness, general weakness, fatigue. Pt states that he took iron pills last MWF. Pt A&Ox4, VSS. ?

## 2021-10-19 ENCOUNTER — Telehealth: Payer: Self-pay | Admitting: Pulmonary Disease

## 2021-10-19 DIAGNOSIS — I959 Hypotension, unspecified: Secondary | ICD-10-CM | POA: Diagnosis present

## 2021-10-19 DIAGNOSIS — E1122 Type 2 diabetes mellitus with diabetic chronic kidney disease: Secondary | ICD-10-CM | POA: Diagnosis present

## 2021-10-19 DIAGNOSIS — Z20822 Contact with and (suspected) exposure to covid-19: Secondary | ICD-10-CM | POA: Diagnosis present

## 2021-10-19 DIAGNOSIS — Z87442 Personal history of urinary calculi: Secondary | ICD-10-CM | POA: Diagnosis not present

## 2021-10-19 DIAGNOSIS — Z833 Family history of diabetes mellitus: Secondary | ICD-10-CM | POA: Diagnosis not present

## 2021-10-19 DIAGNOSIS — M109 Gout, unspecified: Secondary | ICD-10-CM | POA: Diagnosis present

## 2021-10-19 DIAGNOSIS — D62 Acute posthemorrhagic anemia: Secondary | ICD-10-CM | POA: Diagnosis present

## 2021-10-19 DIAGNOSIS — D649 Anemia, unspecified: Secondary | ICD-10-CM | POA: Diagnosis not present

## 2021-10-19 DIAGNOSIS — D72829 Elevated white blood cell count, unspecified: Secondary | ICD-10-CM | POA: Diagnosis not present

## 2021-10-19 DIAGNOSIS — E785 Hyperlipidemia, unspecified: Secondary | ICD-10-CM | POA: Diagnosis present

## 2021-10-19 DIAGNOSIS — K921 Melena: Secondary | ICD-10-CM | POA: Diagnosis not present

## 2021-10-19 DIAGNOSIS — Z86711 Personal history of pulmonary embolism: Secondary | ICD-10-CM | POA: Diagnosis not present

## 2021-10-19 DIAGNOSIS — J9611 Chronic respiratory failure with hypoxia: Secondary | ICD-10-CM

## 2021-10-19 DIAGNOSIS — K5520 Angiodysplasia of colon without hemorrhage: Secondary | ICD-10-CM | POA: Diagnosis not present

## 2021-10-19 DIAGNOSIS — Z9981 Dependence on supplemental oxygen: Secondary | ICD-10-CM | POA: Diagnosis not present

## 2021-10-19 DIAGNOSIS — I129 Hypertensive chronic kidney disease with stage 1 through stage 4 chronic kidney disease, or unspecified chronic kidney disease: Secondary | ICD-10-CM | POA: Diagnosis present

## 2021-10-19 DIAGNOSIS — N1831 Chronic kidney disease, stage 3a: Secondary | ICD-10-CM | POA: Diagnosis not present

## 2021-10-19 DIAGNOSIS — E611 Iron deficiency: Secondary | ICD-10-CM | POA: Diagnosis present

## 2021-10-19 DIAGNOSIS — J441 Chronic obstructive pulmonary disease with (acute) exacerbation: Secondary | ICD-10-CM | POA: Diagnosis not present

## 2021-10-19 DIAGNOSIS — R079 Chest pain, unspecified: Secondary | ICD-10-CM | POA: Diagnosis not present

## 2021-10-19 DIAGNOSIS — Z79899 Other long term (current) drug therapy: Secondary | ICD-10-CM | POA: Diagnosis not present

## 2021-10-19 DIAGNOSIS — Z86718 Personal history of other venous thrombosis and embolism: Secondary | ICD-10-CM | POA: Diagnosis not present

## 2021-10-19 DIAGNOSIS — D500 Iron deficiency anemia secondary to blood loss (chronic): Secondary | ICD-10-CM | POA: Diagnosis not present

## 2021-10-19 DIAGNOSIS — D631 Anemia in chronic kidney disease: Secondary | ICD-10-CM | POA: Diagnosis present

## 2021-10-19 DIAGNOSIS — Z87891 Personal history of nicotine dependence: Secondary | ICD-10-CM | POA: Diagnosis not present

## 2021-10-19 DIAGNOSIS — K31811 Angiodysplasia of stomach and duodenum with bleeding: Secondary | ICD-10-CM | POA: Diagnosis present

## 2021-10-19 DIAGNOSIS — N400 Enlarged prostate without lower urinary tract symptoms: Secondary | ICD-10-CM | POA: Diagnosis present

## 2021-10-19 DIAGNOSIS — J439 Emphysema, unspecified: Secondary | ICD-10-CM | POA: Diagnosis present

## 2021-10-19 DIAGNOSIS — I25119 Atherosclerotic heart disease of native coronary artery with unspecified angina pectoris: Secondary | ICD-10-CM | POA: Diagnosis present

## 2021-10-19 DIAGNOSIS — Z8249 Family history of ischemic heart disease and other diseases of the circulatory system: Secondary | ICD-10-CM | POA: Diagnosis not present

## 2021-10-19 DIAGNOSIS — Z7901 Long term (current) use of anticoagulants: Secondary | ICD-10-CM | POA: Diagnosis not present

## 2021-10-19 LAB — CBC
HCT: 24.7 % — ABNORMAL LOW (ref 39.0–52.0)
Hemoglobin: 7.2 g/dL — ABNORMAL LOW (ref 13.0–17.0)
MCH: 24.6 pg — ABNORMAL LOW (ref 26.0–34.0)
MCHC: 29.1 g/dL — ABNORMAL LOW (ref 30.0–36.0)
MCV: 84.3 fL (ref 80.0–100.0)
Platelets: 186 10*3/uL (ref 150–400)
RBC: 2.93 MIL/uL — ABNORMAL LOW (ref 4.22–5.81)
RDW: 18.6 % — ABNORMAL HIGH (ref 11.5–15.5)
WBC: 9 10*3/uL (ref 4.0–10.5)
nRBC: 0 % (ref 0.0–0.2)

## 2021-10-19 LAB — PREPARE RBC (CROSSMATCH)

## 2021-10-19 LAB — HEMOGLOBIN AND HEMATOCRIT, BLOOD
HCT: 31.2 % — ABNORMAL LOW (ref 39.0–52.0)
Hemoglobin: 9.6 g/dL — ABNORMAL LOW (ref 13.0–17.0)

## 2021-10-19 MED ORDER — ALUM & MAG HYDROXIDE-SIMETH 200-200-20 MG/5ML PO SUSP
15.0000 mL | ORAL | Status: DC | PRN
  Administered 2021-10-19 – 2021-10-21 (×6): 15 mL via ORAL
  Filled 2021-10-19 (×6): qty 30

## 2021-10-19 MED ORDER — FLUTICASONE PROPIONATE 50 MCG/ACT NA SUSP
1.0000 | Freq: Two times a day (BID) | NASAL | Status: DC | PRN
  Administered 2021-10-19 – 2021-10-21 (×2): 1 via NASAL
  Filled 2021-10-19: qty 16

## 2021-10-19 MED ORDER — SODIUM CHLORIDE 0.9% IV SOLUTION: Freq: Once | INTRAVENOUS | Status: AC

## 2021-10-19 MED ORDER — LIP MEDEX EX OINT
TOPICAL_OINTMENT | CUTANEOUS | Status: DC | PRN
  Administered 2021-10-19: 75 via TOPICAL
  Filled 2021-10-19: qty 7

## 2021-10-19 MED ORDER — ALBUTEROL SULFATE (2.5 MG/3ML) 0.083% IN NEBU
2.5000 mg | INHALATION_SOLUTION | RESPIRATORY_TRACT | Status: DC | PRN
  Administered 2021-10-19 (×2): 2.5 mg via RESPIRATORY_TRACT
  Filled 2021-10-19 (×2): qty 3

## 2021-10-19 MED ORDER — NICOTINE 21 MG/24HR TD PT24
21.0000 mg | MEDICATED_PATCH | Freq: Every day | TRANSDERMAL | Status: DC
  Administered 2021-10-19 – 2021-10-22 (×4): 21 mg via TRANSDERMAL
  Filled 2021-10-19 (×4): qty 1

## 2021-10-19 MED ORDER — IPRATROPIUM-ALBUTEROL 0.5-2.5 (3) MG/3ML IN SOLN
3.0000 mL | Freq: Three times a day (TID) | RESPIRATORY_TRACT | Status: DC
  Administered 2021-10-19 – 2021-10-23 (×12): 3 mL via RESPIRATORY_TRACT
  Filled 2021-10-19 (×12): qty 3

## 2021-10-19 MED ORDER — METHYLPREDNISOLONE SODIUM SUCC 125 MG IJ SOLR
80.0000 mg | Freq: Two times a day (BID) | INTRAMUSCULAR | Status: DC
  Administered 2021-10-19 – 2021-10-21 (×4): 80 mg via INTRAVENOUS
  Filled 2021-10-19 (×4): qty 2

## 2021-10-19 NOTE — Consult Note (Addendum)
Referring Provider: TRH ?Primary Care Physician:  Vernie Shanks, MD ?Primary Gastroenterologist:  Dr. Michail Sermon ? ?Reason for Consultation:  Anemia, Melena ? ?HPI: Gregory Christensen is a 71 y.o. male medical history significant of COPD with chronic hypoxia on 2 L with ongoing tobacco use, history of PE/DVT on Eliquis, GI bleed with AVMs, hypertension, CAD, and type 2 diabetes who presented to the ED for worsening shortness of breath. ? ?Patient presented to the ED 10/18/2021 with shortness of breath and concerns for COPD exacerbation worsening for 2 weeks.  Patient is on 2 L of oxygen at home.  He reported ongoing chest pain for 2 months.  Chest pain worsens with coughing and deep breaths.  Notes chest pain is stabbing.  Chest pain was relieved with nitroglycerin as well as Mylanta.  Cardiology was consulted.  Patient had negative ACS evaluation.  Cardiology suspects his angina is due to anemia.  ? ?Patient notes he has had black loose stools since he was previously admitted in December.  Upon admission his hemoglobin was 8.8.  Today hemoglobin dropped to 7.2.  Patient was hypotensive on average 90/60 this morning.  He has been hypertensive this afternoon.  ? ?Previously in the hospital 06/19/21.  At that time hemoglobin was 5.6 and he had melena.  He had GI workup with EGD revealed no active bleeding and moderate antrum gastritis.  Subsequent capsule study showed several small nonbleeding small bowel AVMs, mild gastritis and duodenitis.  He was cleared to resume Eliquis and recommended PPI daily with iron supplement. ? ?Patient was previously seen with Dr. Michail Sermon at [place] on 09/15/2021 following his last hospitalization.  At that time hemoglobin was 9.  On 4/5 and 4/12 repeat CBC showed hemoglobin of 7.7. ? ?Colonoscopy 08/2019 and 3 TA polyps removed and diverticulosis ? ?Last dose of Eliquis this morning. ?Patient denies family history of colon cancer.  Denies NSAID use.  ? ? ?Past Medical History:  ?Diagnosis  Date  ? Acute deep vein thrombosis (DVT) of left lower extremity ([place])   ? Acute hypoxemic respiratory failure ([place])   ? Anemia   ? Iron deficiency  ? Anxiety   ? Aortic atherosclerosis ([place])   ? Arthritis   ? Benign prostatic hyperplasia 08/28/2018  ? UNSPECIFIED WHETHER LOWER URINARY TRACT SYMPTOMS PRESENT  ? CAD (coronary artery disease)   ? Cancer [facility])   ? Chronic back pain   ? Chronic hip pain   ? CKD stage 3a 03/19/2021  ? Constipation   ? due to Iron M-W-F  ? COPD exacerbation ([place]) 10/18/2017  ? Dyspnea   ? Emphysema lung ([place])   ? Essential hypertension   ? GERD (gastroesophageal reflux disease)   ? Gout   ? Gross hematuria 09/08/2018  ? Head injury, acute, with loss of consciousness ([place])   ? unsure how long he was unresponsive  ? History of blood transfusion 05/2019  ? History of kidney stones   ? History of tobacco abuse   ? Hypoxia   ? Insomnia   ? Oxygen dependent   ? 2l- 24/7  ? Pneumonia   ? in 1980's  ? Pre-diabetes   ? Pulmonary embolism ([place]) 05/31/2018  ? Renal mass   ? CONCERNING FOR RENAL CELL CARCINOMA DR. Lovena Neighbours  ? Respiratory distress   ? Respiratory failure, acute-on-chronic ([place]) 08/03/2018  ? Seasonal allergies   ? Sleep apnea   ? ? ?Past Surgical History:  ?Procedure Laterality Date  ? BIOPSY  06/19/2021  ?  Procedure: BIOPSY;  Surgeon: Wilford Corner, MD;  Location: [place];  Service: Endoscopy;;  ? COLONOSCOPY WITH PROPOFOL N/A 09/27/2019  ? Procedure: COLONOSCOPY WITH PROPOFOL;  Surgeon: Irene Shipper, MD;  Location: [facility];  Service: Endoscopy;  Laterality: N/A;  ? CYSTOSCOPY/URETEROSCOPY/HOLMIUM LASER/STENT PLACEMENT Left 10/31/2018  ? Procedure: CYSTOSCOPY/URETEROSCOPY;  Surgeon: Ceasar Mons, MD;  Location: [facility];  Service: Urology;  Laterality: Left;  ? ESOPHAGOGASTRODUODENOSCOPY (EGD) WITH PROPOFOL N/A 09/27/2019  ? Procedure: ESOPHAGOGASTRODUODENOSCOPY (EGD) WITH PROPOFOL;  Surgeon: Irene Shipper, MD;  Location: [facility];  Service: Endoscopy;   Laterality: N/A;  ? ESOPHAGOGASTRODUODENOSCOPY (EGD) WITH PROPOFOL N/A 06/19/2021  ? Procedure: ESOPHAGOGASTRODUODENOSCOPY (EGD) WITH PROPOFOL;  Surgeon: Wilford Corner, MD;  Location: [place];  Service: Endoscopy;  Laterality: N/A;  ? GIVENS CAPSULE STUDY N/A 06/20/2021  ? Procedure: GIVENS CAPSULE STUDY;  Surgeon: Wilford Corner, MD;  Location: [facility] ENDOSCOPY;  Service: Endoscopy;  Laterality: N/A;  ? HERNIA REPAIR Left 1977  ? inguinal  ? HYDROCELE EXCISION Left 05/13/2019  ? Procedure: HYDROCELECTOMY ADULT;  Surgeon: Ceasar Mons, MD;  Location: [facility];  Service: Urology;  Laterality: Left;  ? [place]  ? EXTENSIVE  ? POLYPECTOMY  09/27/2019  ? Procedure: POLYPECTOMY;  Surgeon: Irene Shipper, MD;  Location: [facility];  Service: Endoscopy;;  ? ROBOT ASSITED LAPAROSCOPIC NEPHROURETERECTOMY Left 10/31/2018  ? Procedure: XI ROBOT ASSITED LAPAROSCOPIC NEPHROURETERECTOMY;  Surgeon: Ceasar Mons, MD;  Location: [facility];  Service: Urology;  Laterality: Left;  ONLY NEEDS 240 MIN FOR ALL PROCEDURES  ? SHOULDER ARTHROSCOPY WITH ROTATOR CUFF REPAIR Left 04/12/2020  ? Procedure: LEFT SHOULDER ARTHROSCOPY, DEBRIDEMENT, BICEPS TENODESIS, MINI OPEN ROTATOR CUFF TEAR REPAIR;  Surgeon: Meredith Pel, MD;  Location: [place];  Service: Orthopedics;  Laterality: Left;  ? ? ?Prior to Admission medications   ?Medication Sig Start Date End Date Taking? Authorizing Provider  ?albuterol (PROVENTIL) (2.5 MG/3ML) 0.083% nebulizer solution Take 3 mLs (2.5 mg total) by nebulization every 6 (six) hours as needed for wheezing or shortness of breath. 10/18/21  Yes Parrett, Tammy S, NP  ?albuterol (VENTOLIN HFA) 108 (90 Base) MCG/ACT inhaler Inhale 2 puffs into the lungs every 2 (two) hours as needed for wheezing or shortness of breath. 04/13/21  Yes Freddi Starr, MD  ?amoxicillin-clavulanate (AUGMENTIN) 875-125 MG tablet Take 1 tablet by mouth every 12 (twelve) hours. 10/18/21  Yes  Lucrezia Starch, MD  ?apixaban (ELIQUIS) 5 MG TABS tablet Take 1 tablet (5 mg total) by mouth 2 (two) times daily. 06/10/18  Yes Regalado, Belkys A, MD  ?ascorbic acid (VITAMIN C) 500 MG tablet Take 500 mg by mouth in the morning and at bedtime. 05/10/21  Yes [provider]  ?atorvastatin (LIPITOR) 40 MG tablet Take 80 mg by mouth every evening.   Yes [provider]  ?Dextran 70-Hypromellose (ARTIFICIAL TEARS) 0.1-0.3 % SOLN Place 1 drop into both eyes daily as needed (dry eyes).   Yes [provider]  ?diclofenac Sodium (VOLTAREN) 1 % GEL Apply 2 g topically 2 (two) times daily as needed (Pain).   Yes [provider]  ?docusate sodium (COLACE) 100 MG capsule Take 100 mg by mouth daily as needed for mild constipation.   Yes [provider]  ?ezetimibe (ZETIA) 10 MG tablet Take 1 tablet (10 mg total) by mouth daily. 08/18/21  Yes Chandrasekhar, Mahesh A, MD  ?ferrous sulfate 325 (65 FE) MG tablet Take 1 tablet (325 mg total) by mouth daily.  06/21/21 06/21/22 Yes Shelly Coss, MD  ?finasteride (PROSCAR) 5 MG tablet Take 5 mg by mouth daily. 09/28/21  Yes [provider]  ?fluticasone (FLONASE) 50 MCG/ACT nasal spray Place into both nostrils as needed for allergies or rhinitis.   Yes [provider]  ?fluticasone-salmeterol (ADVAIR) 250-50 MCG/ACT AEPB Inhale 1 puff into the lungs in the morning and at bedtime.   Yes [provider]  ?gabapentin (NEURONTIN) 300 MG capsule Take 300 mg by mouth 3 (three) times daily.   Yes [provider]  ?levocetirizine (XYZAL) 5 MG tablet Take 5 mg by mouth daily as needed for allergies.   Yes [provider]  ?lidocaine (LIDODERM) 5 % Place 1 patch onto the skin every 12 (twelve) hours as needed (pain).   Yes [provider]  ?methenamine (HIPREX) 1 g tablet Take 1 g by mouth in the morning and at bedtime. 05/10/21  Yes [provider]  ?montelukast (SINGULAIR) 10 MG tablet  Take 10 mg by mouth at bedtime.   Yes [provider]  ?nitroGLYCERIN (NITROSTAT) 0.4 MG SL tablet Take up to 3 tablets as needed for chest pain. ?Patient taking differently: 0.4 mg every 5 (five) minutes as ne

## 2021-10-19 NOTE — Telephone Encounter (Signed)
I will fax the information and print off the recent office note.  ?

## 2021-10-19 NOTE — Progress Notes (Signed)
?PROGRESS NOTE ? ? ? ?Gregory Christensen  VWU:[id number] DOB: [birth year] DOA: 10/18/2021 ?PCP: Vernie Shanks, MD  ? ?Brief Narrative:  ? 71 y.o. male with medical history significant of COPD with chronic hypoxia on 2 L with ongoing tobacco use, history of PE/DVT on Eliquis, GI bleed with AVMs, hypertension, CAD, and type 2 diabetes presented with worsening shortness of breath and chest pain.  Patient was referred to the hospital by pulmonary.  On presentation.  Influenza and COVID testing were negative.  Hemoglobin was 8.8.  Chest x-ray showed emphysema but no acute findings.  He was started on IV Solu-Medrol. ? ?Assessment & Plan: ?  ?COPD exacerbation ?Chronic respiratory failure with hypoxia ?-Normally wears 2 L oxygen by nasal cannula at home.  Currently on the same.  He was referred to the hospital by pulmonary clinic because of worsening shortness of breath ?-Chest x-ray negative for acute findings.  COVID-19 and influenza testing negative ?-Increase Solu-Medrol to 80 mg IV every 12 hours.  Continue nebs and montelukast.  DC Rocephin.  Start oral Zithromax. ? ?Chest pain ?History of CAD ?-Has had ongoing chest pain for the past 2 months.  Apparently not a candidate for any procedures as per cardiology. ?-Still intermittently has chest pain.  Consult cardiology.  Continue as needed nitroglycerin.  Continue statin, isosorbide mononitrate ? ?Acute on chronic anemia ?-Has had recent extensive work-up including EGD and capsule endoscopy in December 2022 which had shown mild gastritis, duodenitis and nonbleeding AVMs.  Had recent follow-up with [facility].  He will need to follow-up with [facility] as an outpatient ?-Transfuse 1 unit packed red cells.  Monitor H&H. ? ?CKD stage IIIa ?-Creatinine at baseline currently.  Monitor ? ?History of PE/DVT  ?-continue Eliquis ? ?Hyperlipidemia ?-Continue ezetimibe and statin ? ?Hypertension ?-Blood pressure on the lower side.  Continue isosorbide mononitrate. ? ?BPH ?-Continue  finasteride and tamsulosin ? ?Generalized deconditioning ?-Will need PT eval ? ? ?DVT prophylaxis: Eliquis ?Code Status: Full ?Family Communication: None at bedside ?Disposition Plan: ?Status is: Observation ?The patient will require care spanning > 2 midnights and should be moved to inpatient because: Of severity of illness.  Need for IV steroids.  Cardiology eval. ? ? ? ?Consultants: Cardiology ? ?Procedures: None ? ?Antimicrobials:  ?Anti-infectives (From admission, onward)  ? ? Start     Dose/Rate Route Frequency Ordered Stop  ? 10/18/21 2330  methenamine (MANDELAMINE) tablet 1 g       ? 1 g Oral 2 times daily 10/18/21 2317    ? 10/18/21 2215  cefTRIAXone (ROCEPHIN) 1 g in sodium chloride 0.9 % 100 mL IVPB       ? 1 g ?200 mL/hr over 30 Minutes Intravenous Every 24 hours 10/18/21 2209 10/23/21 2214  ? ?  ? ? ? ?Subjective: ?Patient seen and examined at bedside.  Continues to have intermittent chest pain with heartburn.  Does not feel well.  Still short of breath with exertion.  No fever or vomiting reported. ? ?Objective: ?Vitals:  ? 10/19/21 0405 10/19/21 0453 10/19/21 0800 10/19/21 1112  ?BP:  96/72 (!) 91/57 95/61  ?Pulse: 100 95 85 96  ?Resp: (!) 29 (!) 21 (!) 21 19  ?Temp:    97.6 ?F (36.4 ?C)  ?TempSrc:    Oral  ?SpO2: 97% 100% 100% 100%  ?Weight:      ?Height:      ? ? ?Intake/Output Summary (Last 24 hours) at 10/19/2021 1132 ?Last data filed at 10/18/2021 2157 ?Gross per  24 hour  ?Intake 50 ml  ?Output --  ?Net 50 ml  ? ?Filed Weights  ? 10/18/21 2040  ?Weight: 72.6 kg  ? ? ?Examination: ? ?General exam: Appears calm and comfortable looks chronically ill and deconditioned.  Currently on 2 L oxygen by nasal cannula.  Very thinly built. ?Respiratory system: Bilateral decreased breath sounds at bases with some scattered crackles and intermittent tachypnea ?Cardiovascular system: S1 & S2 heard, Rate controlled ?Gastrointestinal system: Abdomen is nondistended, soft and nontender. Normal bowel sounds  heard. ?Extremities: No cyanosis, clubbing, edema  ?Central nervous system: Alert and oriented.  Slow to respond.  Poor historian.  No focal neurological deficits. Moving extremities ?Skin: No rashes, lesions or ulcers ?Psychiatry: Intermittently anxious.  No signs of agitation. ? ? ?Data Reviewed: I have personally reviewed following labs and imaging studies ? ?CBC: ?Recent Labs  ?Lab 10/18/21 ?1253 10/19/21 ?0716  ?WBC 7.0 9.0  ?NEUTROABS 4.6  --   ?HGB 8.8* 7.2*  ?HCT 31.2* 24.7*  ?MCV 86.9 84.3  ?PLT 202 186  ? ?Basic Metabolic Panel: ?Recent Labs  ?Lab 10/18/21 ?1253  ?NA 137  ?K 4.3  ?CL 110  ?CO2 21*  ?GLUCOSE 123*  ?BUN 10  ?CREATININE 1.51*  ?CALCIUM 8.6*  ? ?GFR: ?Estimated Creatinine Clearance: 46.7 mL/min (A) (by C-G formula based on SCr of 1.51 mg/dL (H)). ?Liver Function Tests: ?No results for input(s): AST, ALT, ALKPHOS, BILITOT, PROT, ALBUMIN in the last 168 hours. ?No results for input(s): LIPASE, AMYLASE in the last 168 hours. ?No results for input(s): AMMONIA in the last 168 hours. ?Coagulation Profile: ?No results for input(s): INR, PROTIME in the last 168 hours. ?Cardiac Enzymes: ?No results for input(s): CKTOTAL, CKMB, CKMBINDEX, TROPONINI in the last 168 hours. ?BNP (last 3 results) ?No results for input(s): PROBNP in the last 8760 hours. ?HbA1C: ?No results for input(s): HGBA1C in the last 72 hours. ?CBG: ?No results for input(s): GLUCAP in the last 168 hours. ?Lipid Profile: ?No results for input(s): CHOL, HDL, LDLCALC, TRIG, CHOLHDL, LDLDIRECT in the last 72 hours. ?Thyroid Function Tests: ?No results for input(s): TSH, T4TOTAL, FREET4, T3FREE, THYROIDAB in the last 72 hours. ?Anemia Panel: ?No results for input(s): VITAMINB12, FOLATE, FERRITIN, TIBC, IRON, RETICCTPCT in the last 72 hours. ?Sepsis Labs: ?No results for input(s): PROCALCITON, LATICACIDVEN in the last 168 hours. ? ?Recent Results (from the past 240 hour(s))  ?Resp Panel by RT-PCR (Flu A&B, Covid) Nasopharyngeal Swab      Status: None  ? Collection Time: 10/18/21  1:21 PM  ? Specimen: Nasopharyngeal Swab; Nasopharyngeal(NP) swabs in vial transport medium  ?Result Value Ref Range Status  ? SARS Coronavirus 2 by RT PCR NEGATIVE NEGATIVE Final  ?  Comment: (NOTE) ?SARS-CoV-2 target nucleic acids are NOT DETECTED. ? ?The SARS-CoV-2 RNA is generally detectable in upper respiratory ?specimens during the acute phase of infection. The lowest ?concentration of SARS-CoV-2 viral copies this assay can detect is ?138 copies/mL. A negative result does not preclude SARS-Cov-2 ?infection and should not be used as the sole basis for treatment or ?other patient management decisions. A negative result may occur with  ?improper specimen collection/handling, submission of specimen other ?than nasopharyngeal swab, presence of viral mutation(s) within the ?areas targeted by this assay, and inadequate number of viral ?copies(<138 copies/mL). A negative result must be combined with ?clinical observations, patient history, and epidemiological ?information. The expected result is Negative. ? ?Fact Sheet for Patients:  ?EntrepreneurPulse.com.au ? ?Fact Sheet for Healthcare Providers:  ?IncredibleEmployment.be ? ?This test  is no t yet approved or cleared by the [place] FDA and  ?has been authorized for detection and/or diagnosis of SARS-CoV-2 by ?FDA under an Emergency Use Authorization (EUA). This EUA will remain  ?in effect (meaning this test can be used) for the duration of the ?COVID-19 declaration under Section 564(b)(1) of the Act, 21 ?U.S.C.section 360bbb-3(b)(1), unless the authorization is terminated  ?or revoked sooner.  ? ? ?  ? Influenza A by PCR NEGATIVE NEGATIVE Final  ? Influenza B by PCR NEGATIVE NEGATIVE Final  ?  Comment: (NOTE) ?The Xpert Xpress SARS-CoV-2/FLU/RSV plus assay is intended as an aid ?in the diagnosis of influenza from Nasopharyngeal swab specimens and ?should not be used as a sole basis  for treatment. Nasal washings and ?aspirates are unacceptable for Xpert Xpress SARS-CoV-2/FLU/RSV ?testing. ? ?Fact Sheet for Patients: ?EntrepreneurPulse.com.au ? ?Fact Sheet for Healthcare Providers: ?htt

## 2021-10-19 NOTE — ED Notes (Signed)
Pt had called out several times throughout the night c/o various pain. Pt moved to hospital bed to aide in comfort. ?

## 2021-10-19 NOTE — Consult Note (Signed)
?Cardiology Consultation:  ? ?Patient ID: Gregory Christensen ?MRN: [id number]; DOB: 03/02/1951 ? ?Admit date: 10/18/2021 ?Date of Consult: 10/19/2021 ? ?PCP:  Vernie Shanks, MD ?  ?[facility] Providers ?Cardiologist:  Werner Lean, MD   { ? ? ?Patient Profile:  ? ?Gregory Christensen is a 71 y.o. male with a hx of CAD, HTN, type 2 DM, oxygen dependent COPD, tobacco use, hx of PE and DVT, hx of GI bleed,  who is being seen 10/19/2021 for the evaluation of chest pain at the request of Dr. Starla Link. ? ?History of Present Illness:  ? ?[name] with above PMH presented to ER last night with c/o SOB. ? ?He was hospitalized 05/2018 for chest pain.  Patient CT angio which revealed 3 V coronary art calcification. Echo from 03/20/21 showed EF 60-65%, no RWMA, low normal RV, RV mildly enlarged. Trivial MR. He was seen by Dr Acie Fredrickson in 2020 via telemedicine visit for chest pain. Had low risk Lexiscan myovue on 10/17/18 with no ischemia.  ? ?He was seen by Dr Gasper Sells on 08/15/21, still had some chest tightness. He had  also had GI bleeding issue requiring GI workup. Options for cardiac work up were felt to be limited due to his bleeding issue, CKD.  His amlodipine was stopped for hypotension, and continued on statin and advised stop smoking.  ? ?He was last hospitalized December 2022 for acute blood loss anemia requiring blood transfusion. He had GI workup 06/19/21 with EGD revealed no active bleeding and moderate antral gastritis.  Subsequent capsule study showed several small nonbleeding small bowel AVMs, mild gastritis and duodenitis.  He was cleared to resume Eliquis and recommended PPI daily with iron supplement. ? ?He came to the hospital last night c/o SOB and chest pain.  He felt his shortness of breath has worsened over the past 2 weeks.  He reported productive cough.  He also reports 2 months duration intermittent chest pain that occurs at rest and with exertion similar to what he complained about when he saw Dr.  Eugenio Hoes last.  Chest pain seems to worse with coughing and deep breathing and is a stabbing sensation.  He had some relief from taking nitroglycerin last week.  ? ?Admission diagnostic revealed CKD stage III with creatinine 1.51, and GFR 49 near baseline.  CBC revealed hemoglobin 8.8 dropping to 7.2 today.  High sensitive troponin negative x2.  BNP 9.9.  Influenza and COVID-19 negative.  Chest x-ray revealed no acute finding but emphysema. ? ?EKG revealed sinus rhythm with ventricular rate of 76 bpm, no acute changes.  He was admitted to hospital medicine service for COPD exacerbation and acute on chronic anemia.  Cardiology consult is requested today for chest pain.  ? ? ?Past Medical History:  ?Diagnosis Date  ? Acute deep vein thrombosis (DVT) of left lower extremity ([place])   ? Acute hypoxemic respiratory failure ([place])   ? Anemia   ? Iron deficiency  ? Anxiety   ? Aortic atherosclerosis ([place])   ? Arthritis   ? Benign prostatic hyperplasia 08/28/2018  ? UNSPECIFIED WHETHER LOWER URINARY TRACT SYMPTOMS PRESENT  ? CAD (coronary artery disease)   ? Cancer [facility])   ? Chronic back pain   ? Chronic hip pain   ? CKD stage 3a 03/19/2021  ? Constipation   ? due to Iron M-W-F  ? COPD exacerbation ([place]) 10/18/2017  ? Dyspnea   ? Emphysema lung ([place])   ? Essential hypertension   ? GERD (gastroesophageal reflux disease)   ?  Gout   ? Gross hematuria 09/08/2018  ? Head injury, acute, with loss of consciousness ([place])   ? unsure how long he was unresponsive  ? History of blood transfusion 05/2019  ? History of kidney stones   ? History of tobacco abuse   ? Hypoxia   ? Insomnia   ? Oxygen dependent   ? 2l- 24/7  ? Pneumonia   ? in 1980's  ? Pre-diabetes   ? Pulmonary embolism ([place]) 05/31/2018  ? Renal mass   ? CONCERNING FOR RENAL CELL CARCINOMA DR. Lovena Neighbours  ? Respiratory distress   ? Respiratory failure, acute-on-chronic ([place]) 08/03/2018  ? Seasonal allergies   ? Sleep apnea   ? ? ?Past Surgical History:  ?Procedure Laterality Date   ? BIOPSY  06/19/2021  ? Procedure: BIOPSY;  Surgeon: Wilford Corner, MD;  Location: [facility];  Service: Endoscopy;;  ? COLONOSCOPY WITH PROPOFOL N/A 09/27/2019  ? Procedure: COLONOSCOPY WITH PROPOFOL;  Surgeon: Irene Shipper, MD;  Location: [facility];  Service: Endoscopy;  Laterality: N/A;  ? CYSTOSCOPY/URETEROSCOPY/HOLMIUM LASER/STENT PLACEMENT Left 10/31/2018  ? Procedure: CYSTOSCOPY/URETEROSCOPY;  Surgeon: Ceasar Mons, MD;  Location: [facility];  Service: Urology;  Laterality: Left;  ? ESOPHAGOGASTRODUODENOSCOPY (EGD) WITH PROPOFOL N/A 09/27/2019  ? Procedure: ESOPHAGOGASTRODUODENOSCOPY (EGD) WITH PROPOFOL;  Surgeon: Irene Shipper, MD;  Location: [facility];  Service: Endoscopy;  Laterality: N/A;  ? ESOPHAGOGASTRODUODENOSCOPY (EGD) WITH PROPOFOL N/A 06/19/2021  ? Procedure: ESOPHAGOGASTRODUODENOSCOPY (EGD) WITH PROPOFOL;  Surgeon: Wilford Corner, MD;  Location: [facility];  Service: Endoscopy;  Laterality: N/A;  ? GIVENS CAPSULE STUDY N/A 06/20/2021  ? Procedure: GIVENS CAPSULE STUDY;  Surgeon: Wilford Corner, MD;  Location: [facility];  Service: Endoscopy;  Laterality: N/A;  ? HERNIA REPAIR Left 1977  ? inguinal  ? HYDROCELE EXCISION Left 05/13/2019  ? Procedure: HYDROCELECTOMY ADULT;  Surgeon: Ceasar Mons, MD;  Location: [facility];  Service: Urology;  Laterality: Left;  ? [place]  ? EXTENSIVE  ? POLYPECTOMY  09/27/2019  ? Procedure: POLYPECTOMY;  Surgeon: Irene Shipper, MD;  Location: [facility];  Service: Endoscopy;;  ? ROBOT ASSITED LAPAROSCOPIC NEPHROURETERECTOMY Left 10/31/2018  ? Procedure: XI ROBOT ASSITED LAPAROSCOPIC NEPHROURETERECTOMY;  Surgeon: Ceasar Mons, MD;  Location: [facility];  Service: Urology;  Laterality: Left;  ONLY NEEDS 240 MIN FOR ALL PROCEDURES  ? SHOULDER ARTHROSCOPY WITH ROTATOR CUFF REPAIR Left 04/12/2020  ? Procedure: LEFT SHOULDER ARTHROSCOPY, DEBRIDEMENT, BICEPS TENODESIS, MINI OPEN ROTATOR CUFF TEAR REPAIR;  Surgeon:  Meredith Pel, MD;  Location: [facility];  Service: Orthopedics;  Laterality: Left;  ?  ? ?Home Medications:  ?Prior to Admission medications   ?Medication Sig Start Date End Date Taking? Authorizing Provider  ?albuterol (PROVENTIL) (2.5 MG/3ML) 0.083% nebulizer solution Take 3 mLs (2.5 mg total) by nebulization every 6 (six) hours as needed for wheezing or shortness of breath. 10/18/21  Yes Parrett, Tammy S, NP  ?albuterol (VENTOLIN HFA) 108 (90 Base) MCG/ACT inhaler Inhale 2 puffs into the lungs every 2 (two) hours as needed for wheezing or shortness of breath. 04/13/21  Yes Freddi Starr, MD  ?amoxicillin-clavulanate (AUGMENTIN) 875-125 MG tablet Take 1 tablet by mouth every 12 (twelve) hours. 10/18/21  Yes Lucrezia Starch, MD  ?apixaban (ELIQUIS) 5 MG TABS tablet Take 1 tablet (5 mg total) by mouth 2 (two) times daily. 06/10/18  Yes Regalado, Belkys A, MD  ?ascorbic acid (VITAMIN C) 500 MG tablet Take 500 mg by mouth in the morning and  at bedtime. 05/10/21  Yes [provider]  ?atorvastatin (LIPITOR) 40 MG tablet Take 80 mg by mouth every evening.   Yes [provider]  ?Dextran 70-Hypromellose (ARTIFICIAL TEARS) 0.1-0.3 % SOLN Place 1 drop into both eyes daily as needed (dry eyes).   Yes [provider]  ?diclofenac Sodium (VOLTAREN) 1 % GEL Apply 2 g topically 2 (two) times daily as needed (Pain).   Yes [provider]  ?docusate sodium (COLACE) 100 MG capsule Take 100 mg by mouth daily as needed for mild constipation.   Yes [provider]  ?ezetimibe (ZETIA) 10 MG tablet Take 1 tablet (10 mg total) by mouth daily. 08/18/21  Yes Chandrasekhar, Mahesh A, MD  ?ferrous sulfate 325 (65 FE) MG tablet Take 1 tablet (325 mg total) by mouth daily. 06/21/21 06/21/22 Yes Shelly Coss, MD  ?finasteride (PROSCAR) 5 MG tablet Take 5 mg by mouth daily. 09/28/21  Yes [provider]  ?fluticasone (FLONASE) 50 MCG/ACT nasal spray Place into both nostrils as needed  for allergies or rhinitis.   Yes [provider]  ?fluticasone-salmeterol (ADVAIR) 250-50 MCG/ACT AEPB Inhale 1 puff into the lungs in the morning and at bedtime.   Yes [provider]

## 2021-10-19 NOTE — ED Notes (Signed)
After albuterol neb, pt states that he is still feeling SOB. Provider notified and told me to offer 0800 duoneb treatment early. After offering this to patient, he states that he is feeling better and can wait until scheduled dose at 0800. Provider updated. ?

## 2021-10-20 ENCOUNTER — Telehealth: Payer: Self-pay | Admitting: Pulmonary Disease

## 2021-10-20 DIAGNOSIS — N1831 Chronic kidney disease, stage 3a: Secondary | ICD-10-CM | POA: Diagnosis not present

## 2021-10-20 DIAGNOSIS — D500 Iron deficiency anemia secondary to blood loss (chronic): Secondary | ICD-10-CM | POA: Diagnosis not present

## 2021-10-20 DIAGNOSIS — R079 Chest pain, unspecified: Secondary | ICD-10-CM | POA: Diagnosis not present

## 2021-10-20 DIAGNOSIS — J441 Chronic obstructive pulmonary disease with (acute) exacerbation: Secondary | ICD-10-CM | POA: Diagnosis not present

## 2021-10-20 LAB — COMPREHENSIVE METABOLIC PANEL
ALT: 14 U/L (ref 0–44)
AST: 22 U/L (ref 15–41)
Albumin: 3.8 g/dL (ref 3.5–5.0)
Alkaline Phosphatase: 66 U/L (ref 38–126)
Anion gap: 10 (ref 5–15)
BUN: 16 mg/dL (ref 8–23)
CO2: 20 mmol/L — ABNORMAL LOW (ref 22–32)
Calcium: 8.8 mg/dL — ABNORMAL LOW (ref 8.9–10.3)
Chloride: 107 mmol/L (ref 98–111)
Creatinine, Ser: 1.49 mg/dL — ABNORMAL HIGH (ref 0.61–1.24)
GFR, Estimated: 50 mL/min — ABNORMAL LOW (ref >60)
Glucose, Bld: 146 mg/dL — ABNORMAL HIGH (ref 70–99)
Potassium: 4.7 mmol/L (ref 3.5–5.1)
Sodium: 137 mmol/L (ref 135–145)
Total Bilirubin: 0.2 mg/dL — ABNORMAL LOW (ref 0.3–1.2)
Total Protein: 7.6 g/dL (ref 6.5–8.1)

## 2021-10-20 LAB — TYPE AND SCREEN
ABO/RH(D): B POS
Antibody Screen: POSITIVE
Donor AG Type: NEGATIVE
Unit division: 0

## 2021-10-20 LAB — BPAM RBC
Blood Product Expiration Date: 202305132359
ISSUE DATE / TIME: 202304201604
Unit Type and Rh: 7300

## 2021-10-20 LAB — CBC WITH DIFFERENTIAL/PLATELET
Abs Immature Granulocytes: 0.13 10*3/uL — ABNORMAL HIGH (ref 0.00–0.07)
Basophils Absolute: 0 10*3/uL (ref 0.0–0.1)
Basophils Relative: 0 %
Eosinophils Absolute: 0 10*3/uL (ref 0.0–0.5)
Eosinophils Relative: 0 %
HCT: 33 % — ABNORMAL LOW (ref 39.0–52.0)
Hemoglobin: 9.8 g/dL — ABNORMAL LOW (ref 13.0–17.0)
Immature Granulocytes: 1 %
Lymphocytes Relative: 5 %
Lymphs Abs: 0.7 10*3/uL (ref 0.7–4.0)
MCH: 25.8 pg — ABNORMAL LOW (ref 26.0–34.0)
MCHC: 29.7 g/dL — ABNORMAL LOW (ref 30.0–36.0)
MCV: 86.8 fL (ref 80.0–100.0)
Monocytes Absolute: 0.4 10*3/uL (ref 0.1–1.0)
Monocytes Relative: 3 %
Neutro Abs: 13.1 10*3/uL — ABNORMAL HIGH (ref 1.7–7.7)
Neutrophils Relative %: 91 %
Platelets: 229 10*3/uL (ref 150–400)
RBC: 3.8 MIL/uL — ABNORMAL LOW (ref 4.22–5.81)
RDW: 18.2 % — ABNORMAL HIGH (ref 11.5–15.5)
WBC: 14.3 10*3/uL — ABNORMAL HIGH (ref 4.0–10.5)
nRBC: 0 % (ref 0.0–0.2)

## 2021-10-20 LAB — MAGNESIUM: Magnesium: 2.6 mg/dL — ABNORMAL HIGH (ref 1.7–2.4)

## 2021-10-20 MED ORDER — ALBUTEROL SULFATE (2.5 MG/3ML) 0.083% IN NEBU: 2.5000 mg | INHALATION_SOLUTION | Freq: Four times a day (QID) | RESPIRATORY_TRACT | 12 refills | Status: DC | PRN

## 2021-10-20 MED ORDER — AZITHROMYCIN 250 MG PO TABS
500.0000 mg | ORAL_TABLET | Freq: Every day | ORAL | Status: DC
  Administered 2021-10-20 – 2021-10-22 (×3): 500 mg via ORAL
  Filled 2021-10-20 (×3): qty 2

## 2021-10-20 NOTE — Progress Notes (Signed)
[facility] Progress Note ? ?Gregory Christensen 71 y.o. [birth year] ? ?CC:   Anemia, Melena ? ? ?Subjective: ?Patient seen and examined sitting up in bed.  Patient is much less short of breath today.  He is eating his clear liquid breakfast tolerating well.  Reports 1 bowel movement this morning that continues to be black in color.  Denies abdominal pain, nausea, vomiting.  ? ?Patient received 1 unit of packed red blood cells yesterday. ? ?ROS : Review of Systems  ?Constitutional:  Negative for chills and fever.  ?Gastrointestinal:  Positive for melena. Negative for abdominal pain, blood in stool, constipation, diarrhea, heartburn, nausea and vomiting.  ?Genitourinary:  Negative for dysuria, frequency and hematuria.  ? ? ? ?Objective: ?Vital signs in last 24 hours: ?Vitals:  ? 10/19/21 2315 10/20/21 0210  ?BP: 121/76 118/72  ?Pulse: 89 79  ?Resp: 20 20  ?Temp: 98.3 ?F (36.8 ?C) 98.1 ?F (36.7 ?C)  ?SpO2: 98% 99%  ? ? ?Physical Exam: ? ?General:  Alert, cooperative, no distress, appears stated age,  ?Head:  Normocephalic, without obvious abnormality, atraumatic  ?Eyes:  Anicteric sclera, EOM's intact, conjunctival pallor  ?Lungs:   Respirations with mild labor, diffuse crackles b/l   ?Heart:  Regular rate and rhythm, S1, S2 normal  ?Abdomen:   Soft, non-tender, bowel sounds active all four quadrants,  no masses,   ?Extremities: Extremities normal, atraumatic, no  edema  ?Pulses: 2+ and symmetric  ? ? ?Lab Results: ?Recent Labs  ?  10/18/21 ?1253 10/20/21 ?3818  ?NA 137 137  ?K 4.3 4.7  ?CL 110 107  ?CO2 21* 20*  ?GLUCOSE 123* 146*  ?BUN 10 16  ?CREATININE 1.51* 1.49*  ?CALCIUM 8.6* 8.8*  ?MG  --  2.6*  ? ?Recent Labs  ?  10/20/21 ?2993  ?AST 22  ?ALT 14  ?ALKPHOS 66  ?BILITOT 0.2*  ?PROT 7.6  ?ALBUMIN 3.8  ? ?Recent Labs  ?  10/18/21 ?1253 10/19/21 ?0716 10/19/21 ?2045 10/20/21 ?7169  ?WBC 7.0 9.0  --  14.3*  ?NEUTROABS 4.6  --   --  13.1*  ?HGB 8.8* 7.2* 9.6* 9.8*  ?HCT 31.2* 24.7* 31.2* 33.0*  ?MCV 86.9 84.3  --   86.8  ?PLT 202 186  --  229  ? ?No results for input(s): LABPROT, INR in the last 72 hours. ? ? ? ?Assessment ?Anemia ?Melena ?Small bowel AVMs ?COPD exacerbation ?  ?HGB 9.8 (9.6) Platelets 229 (186) WBC 14.3(9.0) ? ?Received 1 unit of packed red blood cells yesterday ?  ?Iron panel 08/15/2021 iron 352, saturation greater than 95% ?  ?With current COPD exacerbation and poor respiratory status he is not a good candidate for procedures.  Possible black stools due to iron supplementation.   Baseline Hgb seems to be around 8.5, no profound drop in hemoglobin recently.  Outpatient hemoglobin 7.7 for the last 2 weeks.  Patient also has history of AVMs likely has some aspect of chronic GI bleed slowly worsened with chronic anticoagulation. ?  ?Eliquis was held on 10/19/2021 by cardiology ? ?EGD 06/22/2022 revealed no active bleeding and moderate antrum gastritis.  Subsequent capsule study showed several small nonbleeding small bowel AVMs, mild gastritis and duodenitis. ? ? ?Plan: ?- Monitor conservatively at this time for continued GI bleeding.  No plan for procedure at this time.  Will reevaluate need for procedure when respiratory status improves. ?-Continue supportive care ?-Monitor hemoglobin and transfuse if below 8 per cardiology ?-Continue pantoprazole 40 mg twice daily. ?-Recommend hold Eliquis  for now ?[facility] will follow ? ?Charlott Rakes PA-C ?10/20/2021, 9:11 AM ? ?Contact #  [phone]  ?

## 2021-10-20 NOTE — Progress Notes (Signed)
?PROGRESS NOTE ? ? ? ?Gregory Christensen  IFO:[id number] DOB: 27-Aug-1950 DOA: 10/18/2021 ?PCP: Vernie Shanks, MD  ? ?Brief Narrative:  ? 71 y.o. male with medical history significant of COPD with chronic hypoxia on 2 L with ongoing tobacco use, history of PE/DVT on Eliquis, GI bleed with AVMs, hypertension, CAD, and type 2 diabetes presented with worsening shortness of breath and chest pain.  Patient was referred to the hospital by pulmonary.  On presentation.  Influenza and COVID testing were negative.  Hemoglobin was 8.8.  Chest x-ray showed emphysema but no acute findings.  He was started on IV Solu-Medrol.  Cardiology and subsequently GI were consulted. ? ?Assessment & Plan: ?  ?COPD exacerbation ?Chronic respiratory failure with hypoxia ?-Normally wears 2 L oxygen by nasal cannula at home.  Currently on the same.  He was referred to the hospital by pulmonary clinic because of worsening shortness of breath ?-Chest x-ray negative for acute findings.  COVID-19 and influenza testing negative ?-Continue IV Solu-Medrol.  Continue nebs and montelukast.  DC Rocephin.  Start oral Zithromax. ? ?Chest pain ?History of CAD ?-Has had ongoing chest pain for the past 2 months.  Apparently not a candidate for any procedures as per cardiology. ?-Continue as needed nitroglycerin.  Continue statin, isosorbide mononitrate ?-Cardiology evaluation appreciated: Recommended continuing supportive care with medical management.  Outpatient follow-up with cardiology to consider coronary CTA if renal function improves.  Cardiology signed off on 10/19/2021. ? ?Acute on chronic anemia ?Melena/history of small bowel AVMs ?-Has had recent extensive work-up including EGD and capsule endoscopy in December 2022 which had shown mild gastritis, duodenitis and nonbleeding AVMs.   ?-Status post 1 unit packed red cell transfusion on 10/19/2021 for hemoglobin of 7.2.  Hemoglobin 9.8 today.  Monitor H&H. ?-GI following.  Currently being managed conservatively.   Continue oral Protonix twice a day.   ? ?CKD stage IIIa ?-Creatinine at baseline currently.  Monitor ? ?History of PE/DVT  ?-Eliquis held on 10/19/2021 by cardiology ? ?Leukocytosis ?-Possibly reactive. ? ?Hyperlipidemia ?-Continue ezetimibe and statin ? ?Hypertension ?-Blood pressure on the lower side but still.  Continue isosorbide mononitrate. ? ?BPH ?-Continue finasteride and tamsulosin ? ?Generalized deconditioning ?-Will need PT eval ? ? ?DVT prophylaxis: Eliquis ?Code Status: Full ?Family Communication: None at bedside ?Disposition Plan: ?Status is:  inpatient because: Of severity of illness.  Need for IV steroids.   ? ? ?Consultants: Cardiology/GI ? ?Procedures: None ? ?Antimicrobials:  ?Anti-infectives (From admission, onward)  ? ? Start     Dose/Rate Route Frequency Ordered Stop  ? 10/18/21 2330  methenamine (MANDELAMINE) tablet 1 g       ? 1 g Oral 2 times daily 10/18/21 2317    ? 10/18/21 2215  cefTRIAXone (ROCEPHIN) 1 g in sodium chloride 0.9 % 100 mL IVPB       ? 1 g ?200 mL/hr over 30 Minutes Intravenous Every 24 hours 10/18/21 2209 10/23/21 2159  ? ?  ? ? ? ?Subjective: ?Patient seen and examined at bedside.  Poor historian.  Feels better this morning but still has intermittent shortness of breath with exertion.  Wants to advance his diet to solid food.  No fever, vomiting, worsening abdominal pain reported. ?Objective: ?Vitals:  ? 10/19/21 1938 10/19/21 2006 10/19/21 2315 10/20/21 0210  ?BP: 114/65  121/76 118/72  ?Pulse: 92  89 79  ?Resp: '20  20 20  '$ ?Temp: 98.1 ?F (36.7 ?C)  98.3 ?F (36.8 ?C) 98.1 ?F (36.7 ?C)  ?TempSrc: Oral  Oral Oral  ?SpO2: 98% 99% 98% 99%  ?Weight:      ?Height:      ? ? ?Intake/Output Summary (Last 24 hours) at 10/20/2021 0749 ?Last data filed at 10/20/2021 0600 ?Gross per 24 hour  ?Intake 515 ml  ?Output 1850 ml  ?Net -1335 ml  ? ? ?Filed Weights  ? 10/18/21 2040  ?Weight: 72.6 kg  ? ? ?Examination: ? ?General: On 2 L oxygen via nasal cannula.  No distress.  Looks chronically  ill and deconditioned.  Poor historian. ?ENT/neck: No thyromegaly.  JVD is not elevated  ?respiratory: Decreased breath sounds at bases bilaterally with some crackles; no wheezing CVS: S1-S2 heard, rate controlled currently ?Abdominal: Soft, nontender, slightly distended; no organomegaly, normal bowel sounds are heard ?Extremities: Trace lower extremity edema; no cyanosis  ?CNS: Awake and alert.  Slow to respond.  No focal neurologic deficit.  Moves extremities ?Lymph: No obvious lymphadenopathy ?Skin: No obvious ecchymosis/lesions  ?psych: Currently not agitated.  Affect is mostly flat. ?musculoskeletal: No obvious joint swelling/deformity ? ? ? ?Data Reviewed: I have personally reviewed following labs and imaging studies ? ?CBC: ?Recent Labs  ?Lab 10/18/21 ?1253 10/19/21 ?0716 10/19/21 ?2045 10/20/21 ?5784  ?WBC 7.0 9.0  --  14.3*  ?NEUTROABS 4.6  --   --  13.1*  ?HGB 8.8* 7.2* 9.6* 9.8*  ?HCT 31.2* 24.7* 31.2* 33.0*  ?MCV 86.9 84.3  --  86.8  ?PLT 202 186  --  229  ? ? ?Basic Metabolic Panel: ?Recent Labs  ?Lab 10/18/21 ?1253 10/20/21 ?6962  ?NA 137 137  ?K 4.3 4.7  ?CL 110 107  ?CO2 21* 20*  ?GLUCOSE 123* 146*  ?BUN 10 16  ?CREATININE 1.51* 1.49*  ?CALCIUM 8.6* 8.8*  ?MG  --  2.6*  ? ? ?GFR: ?Estimated Creatinine Clearance: 47.4 mL/min (A) (by C-G formula based on SCr of 1.49 mg/dL (H)). ?Liver Function Tests: ?Recent Labs  ?Lab 10/20/21 ?9528  ?AST 22  ?ALT 14  ?ALKPHOS 66  ?BILITOT 0.2*  ?PROT 7.6  ?ALBUMIN 3.8  ? ?No results for input(s): LIPASE, AMYLASE in the last 168 hours. ?No results for input(s): AMMONIA in the last 168 hours. ?Coagulation Profile: ?No results for input(s): INR, PROTIME in the last 168 hours. ?Cardiac Enzymes: ?No results for input(s): CKTOTAL, CKMB, CKMBINDEX, TROPONINI in the last 168 hours. ?BNP (last 3 results) ?No results for input(s): PROBNP in the last 8760 hours. ?HbA1C: ?No results for input(s): HGBA1C in the last 72 hours. ?CBG: ?No results for input(s): GLUCAP in the last 168  hours. ?Lipid Profile: ?No results for input(s): CHOL, HDL, LDLCALC, TRIG, CHOLHDL, LDLDIRECT in the last 72 hours. ?Thyroid Function Tests: ?No results for input(s): TSH, T4TOTAL, FREET4, T3FREE, THYROIDAB in the last 72 hours. ?Anemia Panel: ?No results for input(s): VITAMINB12, FOLATE, FERRITIN, TIBC, IRON, RETICCTPCT in the last 72 hours. ?Sepsis Labs: ?No results for input(s): PROCALCITON, LATICACIDVEN in the last 168 hours. ? ?Recent Results (from the past 240 hour(s))  ?Resp Panel by RT-PCR (Flu A&B, Covid) Nasopharyngeal Swab     Status: None  ? Collection Time: 10/18/21  1:21 PM  ? Specimen: Nasopharyngeal Swab; Nasopharyngeal(NP) swabs in vial transport medium  ?Result Value Ref Range Status  ? SARS Coronavirus 2 by RT PCR NEGATIVE NEGATIVE Final  ?  Comment: (NOTE) ?SARS-CoV-2 target nucleic acids are NOT DETECTED. ? ?The SARS-CoV-2 RNA is generally detectable in upper respiratory ?specimens during the acute phase of infection. The lowest ?concentration of SARS-CoV-2 viral copies this  assay can detect is ?138 copies/mL. A negative result does not preclude SARS-Cov-2 ?infection and should not be used as the sole basis for treatment or ?other patient management decisions. A negative result may occur with  ?improper specimen collection/handling, submission of specimen other ?than nasopharyngeal swab, presence of viral mutation(s) within the ?areas targeted by this assay, and inadequate number of viral ?copies(<138 copies/mL). A negative result must be combined with ?clinical observations, patient history, and epidemiological ?information. The expected result is Negative. ? ?Fact Sheet for Patients:  ?EntrepreneurPulse.com.au ? ?Fact Sheet for Healthcare Providers:  ?IncredibleEmployment.be ? ?This test is no t yet approved or cleared by the [place] FDA and  ?has been authorized for detection and/or diagnosis of SARS-CoV-2 by ?FDA under an Emergency Use Authorization  (EUA). This EUA will remain  ?in effect (meaning this test can be used) for the duration of the ?COVID-19 declaration under Section 564(b)(1) of the Act, 21 ?U.S.C.section 360bbb-3(b)(1), unless the

## 2021-10-20 NOTE — Telephone Encounter (Signed)
Will reprint RX with Dr. August Albino name on it and stamped signature.  ?

## 2021-10-21 DIAGNOSIS — E875 Hyperkalemia: Secondary | ICD-10-CM

## 2021-10-21 DIAGNOSIS — D500 Iron deficiency anemia secondary to blood loss (chronic): Secondary | ICD-10-CM | POA: Diagnosis not present

## 2021-10-21 DIAGNOSIS — J441 Chronic obstructive pulmonary disease with (acute) exacerbation: Secondary | ICD-10-CM | POA: Diagnosis not present

## 2021-10-21 DIAGNOSIS — R079 Chest pain, unspecified: Secondary | ICD-10-CM | POA: Diagnosis not present

## 2021-10-21 DIAGNOSIS — N1831 Chronic kidney disease, stage 3a: Secondary | ICD-10-CM | POA: Diagnosis not present

## 2021-10-21 LAB — CBC WITH DIFFERENTIAL/PLATELET
Abs Immature Granulocytes: 0.11 10*3/uL — ABNORMAL HIGH (ref 0.00–0.07)
Basophils Absolute: 0 10*3/uL (ref 0.0–0.1)
Basophils Relative: 0 %
Eosinophils Absolute: 0 10*3/uL (ref 0.0–0.5)
Eosinophils Relative: 0 %
HCT: 34 % — ABNORMAL LOW (ref 39.0–52.0)
Hemoglobin: 10.2 g/dL — ABNORMAL LOW (ref 13.0–17.0)
Immature Granulocytes: 1 %
Lymphocytes Relative: 5 %
Lymphs Abs: 0.6 10*3/uL — ABNORMAL LOW (ref 0.7–4.0)
MCH: 25.7 pg — ABNORMAL LOW (ref 26.0–34.0)
MCHC: 30 g/dL (ref 30.0–36.0)
MCV: 85.6 fL (ref 80.0–100.0)
Monocytes Absolute: 0.4 10*3/uL (ref 0.1–1.0)
Monocytes Relative: 3 %
Neutro Abs: 10.2 10*3/uL — ABNORMAL HIGH (ref 1.7–7.7)
Neutrophils Relative %: 91 %
Platelets: 214 10*3/uL (ref 150–400)
RBC: 3.97 MIL/uL — ABNORMAL LOW (ref 4.22–5.81)
RDW: 18 % — ABNORMAL HIGH (ref 11.5–15.5)
WBC: 11.3 10*3/uL — ABNORMAL HIGH (ref 4.0–10.5)
nRBC: 0 % (ref 0.0–0.2)

## 2021-10-21 LAB — BASIC METABOLIC PANEL
Anion gap: 8 (ref 5–15)
BUN: 22 mg/dL (ref 8–23)
CO2: 23 mmol/L (ref 22–32)
Calcium: 9 mg/dL (ref 8.9–10.3)
Chloride: 104 mmol/L (ref 98–111)
Creatinine, Ser: 1.74 mg/dL — ABNORMAL HIGH (ref 0.61–1.24)
GFR, Estimated: 42 mL/min — ABNORMAL LOW (ref >60)
Glucose, Bld: 167 mg/dL — ABNORMAL HIGH (ref 70–99)
Potassium: 5.6 mmol/L — ABNORMAL HIGH (ref 3.5–5.1)
Sodium: 135 mmol/L (ref 135–145)

## 2021-10-21 LAB — MAGNESIUM: Magnesium: 2.7 mg/dL — ABNORMAL HIGH (ref 1.7–2.4)

## 2021-10-21 MED ORDER — LIDOCAINE 5 % EX PTCH
1.0000 | MEDICATED_PATCH | CUTANEOUS | Status: DC
  Administered 2021-10-21 – 2021-10-22 (×2): 1 via TRANSDERMAL
  Filled 2021-10-21 (×2): qty 1

## 2021-10-21 MED ORDER — SODIUM ZIRCONIUM CYCLOSILICATE 10 G PO PACK
10.0000 g | PACK | Freq: Once | ORAL | Status: AC
  Administered 2021-10-21: 10 g via ORAL
  Filled 2021-10-21: qty 1

## 2021-10-21 MED ORDER — OXYMETAZOLINE HCL 0.05 % NA SOLN
1.0000 | Freq: Two times a day (BID) | NASAL | Status: DC | PRN
  Filled 2021-10-21: qty 15

## 2021-10-21 MED ORDER — APIXABAN 5 MG PO TABS
5.0000 mg | ORAL_TABLET | Freq: Two times a day (BID) | ORAL | Status: DC
  Administered 2021-10-21 – 2021-10-22 (×4): 5 mg via ORAL
  Filled 2021-10-21 (×4): qty 1

## 2021-10-21 MED ORDER — METHYLPREDNISOLONE SODIUM SUCC 125 MG IJ SOLR
60.0000 mg | Freq: Two times a day (BID) | INTRAMUSCULAR | Status: DC
  Administered 2021-10-21 – 2021-10-22 (×2): 60 mg via INTRAVENOUS
  Filled 2021-10-21 (×2): qty 2

## 2021-10-21 NOTE — Plan of Care (Signed)
?  Problem: Education: ?Goal: Knowledge of disease or condition will improve ?Outcome: Progressing ?  ?Problem: Education: ?Goal: Knowledge of the prescribed therapeutic regimen will improve ?Outcome: Progressing ?  ?Problem: Activity: ?Goal: Ability to tolerate increased activity will improve ?Outcome: Progressing ?  ?Problem: Activity: ?Goal: Will verbalize the importance of balancing activity with adequate rest periods ?Outcome: Progressing ?  ?Problem: Respiratory: ?Goal: Ability to maintain a clear airway will improve ?Outcome: Progressing ?  ?Problem: Respiratory: ?Goal: Levels of oxygenation will improve ?Outcome: Progressing ?  ?

## 2021-10-21 NOTE — Progress Notes (Signed)
Subjective: ?Patient reports having 5-6 bowel movements in the last 3 days. ?Bedside commode with dark green stools. ?Denies abdominal pain, nausea or vomiting. ? ?Objective: ?Vital signs in last 24 hours: ?Temp:  [97.7 ?F (36.5 ?C)-98.2 ?F (36.8 ?C)] 98.1 ?F (36.7 ?C) (04/22 3893) ?Pulse Rate:  [83-96] 91 (04/22 0956) ?Resp:  [15-20] 20 (04/22 0956) ?BP: (119-159)/(56-91) 142/56 (04/22 0956) ?SpO2:  [92 %-100 %] 100 % (04/22 0956) ?Weight change:  ?Last BM Date : 10/21/21 ? ?PE: On oxygen via nasal cannula ?GENERAL: Bilateral wheezes audible  ?ABDOMEN: Soft, nondistended, nontender, normoactive bowel sounds ?EXTREMITIES: No deformity ? ?Lab Results: ?Results for orders placed or performed during the hospital encounter of 10/18/21 (from the past 48 hour(s))  ?Prepare RBC (crossmatch)     Status: None  ? Collection Time: 10/19/21 12:10 PM  ?Result Value Ref Range  ? Order Confirmation    ?  ORDER PROCESSED BY BLOOD BANK ?Performed at [facility], [place] [address] [place], [place] [address] ?  ?Type and screen [place]     Status: None  ? Collection Time: 10/19/21 12:10 PM  ?Result Value Ref Range  ? ABO/RH(D) B POS   ? Antibody Screen POS   ? Sample Expiration 10/22/2021,2359   ? Antibody Identification ANTI M   ? Unit Number [id number]   ? Blood Component Type RED CELLS,LR   ? Unit division 00   ? Status of Unit ISSUED,FINAL   ? Transfusion Status OK TO TRANSFUSE   ? Crossmatch Result COMPATIBLE   ? Donor AG Type NEGATIVE FOR M ANTIGEN   ?Hemoglobin and hematocrit, blood     Status: Abnormal  ? Collection Time: 10/19/21  8:45 PM  ?Result Value Ref Range  ? Hemoglobin 9.6 (L) 13.0 - 17.0 g/dL  ?  Comment: REPEATED TO VERIFY ?POST TRANSFUSION SPECIMEN ?  ? HCT 31.2 (L) 39.0 - 52.0 %  ?  Comment: Performed at [facility], [place] [address], [place], [place] [address]  ?CBC with Differential/Platelet     Status: Abnormal  ? Collection Time: 10/20/21  5:14 AM   ?Result Value Ref Range  ? WBC 14.3 (H) 4.0 - 10.5 K/uL  ? RBC 3.80 (L) 4.22 - 5.81 MIL/uL  ? Hemoglobin 9.8 (L) 13.0 - 17.0 g/dL  ? HCT 33.0 (L) 39.0 - 52.0 %  ? MCV 86.8 80.0 - 100.0 fL  ? MCH 25.8 (L) 26.0 - 34.0 pg  ? MCHC 29.7 (L) 30.0 - 36.0 g/dL  ? RDW 18.2 (H) 11.5 - 15.5 %  ? Platelets 229 150 - 400 K/uL  ? nRBC 0.0 0.0 - 0.2 %  ? Neutrophils Relative % 91 %  ? Neutro Abs 13.1 (H) 1.7 - 7.7 K/uL  ? Lymphocytes Relative 5 %  ? Lymphs Abs 0.7 0.7 - 4.0 K/uL  ? Monocytes Relative 3 %  ? Monocytes Absolute 0.4 0.1 - 1.0 K/uL  ? Eosinophils Relative 0 %  ? Eosinophils Absolute 0.0 0.0 - 0.5 K/uL  ? Basophils Relative 0 %  ? Basophils Absolute 0.0 0.0 - 0.1 K/uL  ? Immature Granulocytes 1 %  ? Abs Immature Granulocytes 0.13 (H) 0.00 - 0.07 K/uL  ?  Comment: Performed at [facility], [place] [address] [place], [place] [address]  ?Comprehensive metabolic panel     Status: Abnormal  ? Collection Time: 10/20/21  5:14 AM  ?Result Value Ref Range  ? Sodium 137 135 - 145 mmol/L  ?  Potassium 4.7 3.5 - 5.1 mmol/L  ? Chloride 107 98 - 111 mmol/L  ? CO2 20 (L) 22 - 32 mmol/L  ? Glucose, Bld 146 (H) 70 - 99 mg/dL  ?  Comment: Glucose reference range applies only to samples taken after fasting for at least 8 hours.  ? BUN 16 8 - 23 mg/dL  ? Creatinine, Ser 1.49 (H) 0.61 - 1.24 mg/dL  ? Calcium 8.8 (L) 8.9 - 10.3 mg/dL  ? Total Protein 7.6 6.5 - 8.1 g/dL  ? Albumin 3.8 3.5 - 5.0 g/dL  ? AST 22 15 - 41 U/L  ? ALT 14 0 - 44 U/L  ? Alkaline Phosphatase 66 38 - 126 U/L  ? Total Bilirubin 0.2 (L) 0.3 - 1.2 mg/dL  ? GFR, Estimated 50 (L) >60 mL/min  ?  Comment: (NOTE) ?Calculated using the CKD-EPI Creatinine Equation (2021) ?  ? Anion gap 10 5 - 15  ?  Comment: Performed at [facility], [place] [address], [place], [place] [address]  ?Magnesium     Status: Abnormal  ? Collection Time: 10/20/21  5:14 AM  ?Result Value Ref Range  ? Magnesium 2.6 (H) 1.7 - 2.4 mg/dL  ?  Comment: Performed at [facility], [place] [address], [place], [place] [address]  ?CBC with Differential/Platelet     Status: Abnormal  ? Collection Time: 10/21/21  6:28 AM  ?Result Value Ref Range  ? WBC 11.3 (H) 4.0 - 10.5 K/uL  ? RBC 3.97 (L) 4.22 - 5.81 MIL/uL  ? Hemoglobin 10.2 (L) 13.0 - 17.0 g/dL  ? HCT 34.0 (L) 39.0 - 52.0 %  ? MCV 85.6 80.0 - 100.0 fL  ? MCH 25.7 (L) 26.0 - 34.0 pg  ? MCHC 30.0 30.0 - 36.0 g/dL  ? RDW 18.0 (H) 11.5 - 15.5 %  ? Platelets 214 150 - 400 K/uL  ? nRBC 0.0 0.0 - 0.2 %  ? Neutrophils Relative % 91 %  ? Neutro Abs 10.2 (H) 1.7 - 7.7 K/uL  ? Lymphocytes Relative 5 %  ? Lymphs Abs 0.6 (L) 0.7 - 4.0 K/uL  ? Monocytes Relative 3 %  ? Monocytes Absolute 0.4 0.1 - 1.0 K/uL  ? Eosinophils Relative 0 %  ? Eosinophils Absolute 0.0 0.0 - 0.5 K/uL  ? Basophils Relative 0 %  ? Basophils Absolute 0.0 0.0 - 0.1 K/uL  ? Immature Granulocytes 1 %  ? Abs Immature Granulocytes 0.11 (H) 0.00 - 0.07 K/uL  ?  Comment: Performed at [facility], [place] [address] [place],  [address]  ?Basic metabolic panel     Status: Abnormal  ? Collection Time: 10/21/21  6:28 AM  ?Result Value Ref Range  ? Sodium 135 135 - 145 mmol/L  ? Potassium 5.6 (H) 3.5 - 5.1 mmol/L  ? Chloride 104 98 - 111 mmol/L  ? CO2 23 22 - 32 mmol/L  ? Glucose, Bld 167 (H) 70 - 99 mg/dL  ?  Comment: Glucose reference range applies only to samples taken after fasting for at least 8 hours.  ? BUN 22 8 - 23 mg/dL  ? Creatinine, Ser 1.74 (H) 0.61 - 1.24 mg/dL  ? Calcium 9.0 8.9 - 10.3 mg/dL  ? GFR, Estimated 42 (L) >60 mL/min  ?  Comment: (NOTE) ?Calculated using the CKD-EPI Creatinine Equation (2021) ?  ? Anion gap 8 5 - 15  ?  Comment: Performed at [facility], [address]  [place], [place] [address]  ?Magnesium     Status: Abnormal  ? Collection Time: 10/21/21  6:28 AM  ?Result Value Ref Range  ? Magnesium 2.7 (H) 1.7 - 2.4 mg/dL  ?  Comment: Performed at [facility], [place] [address], [place], [place] [address]  ? ? ?Studies/Results: ?No results found. ? ?Medications: I have reviewed the patient's current medications. ? ?Assessment: ?History of small bowel AVMs, acute on chronic anemia ?BUN 22/creatinine 1.74 today ?Hemoglobin 9.8 yesterday 10.2 today ? ?Multiple comorbidities-COPD, CAD, CDK stage III, history of PE/DVT ? ?Plan: ?On heart healthy diet ?Okay to resume Eliquis ?No plans for EGD as patient has small bowel AVM. ?If patient has drop in blood count or destabilizing bleed, patient will need double-balloon enteroscopy, which can only be performed at [facility]. ?Majority of small bowel AVMs managed conservatively with blood transfusion and oral/IV iron. ?GI will sign off, patient to follow-up with his primary gastroenterologist, Dr. Michail Sermon on discharge. ? ?Ronnette Juniper, MD ?10/21/2021, 11:15 AM ? ?  ?

## 2021-10-21 NOTE — Progress Notes (Addendum)
?PROGRESS NOTE ? ? ? ?Gregory Christensen  MKL:[id number] DOB: May 25, 1951 DOA: 10/18/2021 ?PCP: Vernie Shanks, MD  ? ?Brief Narrative:  ? 71 y.o. male with medical history significant of COPD with chronic hypoxia on 2 L with ongoing tobacco use, history of PE/DVT on Eliquis, GI bleed with AVMs, hypertension, CAD, and type 2 diabetes presented with worsening shortness of breath and chest pain.  Patient was referred to the hospital by pulmonary.  On presentation.  Influenza and COVID testing were negative.  Hemoglobin was 8.8.  Chest x-ray showed emphysema but no acute findings.  He was started on IV Solu-Medrol.  Cardiology and subsequently GI were consulted. ? ?Assessment & Plan: ?  ?COPD exacerbation ?Chronic respiratory failure with hypoxia ?-Normally wears 2 L oxygen by nasal cannula at home.  Currently on the same.  He was referred to the hospital by pulmonary clinic because of worsening shortness of breath ?-Chest x-ray negative for acute findings.  COVID-19 and influenza testing negative ?-Decrease Solu-Medrol to 60 mg IV every 12 hours.  Continue nebs and montelukast.  Continue oral Zithromax. ? ?Chest pain ?History of CAD ?-Has had ongoing chest pain for the past 2 months.  Apparently not a candidate for any procedures as per cardiology. ?-Continue as needed nitroglycerin.  Continue statin, isosorbide mononitrate ?-Cardiology evaluation appreciated: Recommended continuing supportive care with medical management.  Outpatient follow-up with cardiology to consider coronary CTA if renal function improves.  Cardiology signed off on 10/19/2021. ? ?Acute on chronic anemia ?Melena/history of small bowel AVMs ?-Has had recent extensive work-up including EGD and capsule endoscopy in December 2022 which had shown mild gastritis, duodenitis and nonbleeding AVMs.   ?-Status post 1 unit packed red cell transfusion on 10/19/2021 for hemoglobin of 7.2.  Hemoglobin 10.2 today.  Monitor H&H. ?-GI recommended to continue conservative  management.  Continue oral Protonix twice a day.  Outpatient follow-up with GI.  Will need outpatient hematology evaluation and follow-up as well as patient might have recurrent anemia from intermittent small bowel AVMs bleed. ? ?CKD stage IIIa ?-Creatinine at baseline currently.  Monitor ? ?Hyperkalemia ?-Questionable cause.  We will give 1 dose of Lokelma.  Repeat a.m. labs. ? ?History of PE/DVT  ?-Eliquis held on 10/19/2021 by cardiology.  Resume Eliquis from today as per GI recommendations. ? ?Leukocytosis ?-Possibly reactive.  Improving. ? ?Hyperlipidemia ?-Continue ezetimibe and statin ? ?Hypertension ?-Blood pressure currently stable.  Continue isosorbide mononitrate. ? ?BPH ?-Continue finasteride and tamsulosin ? ?Generalized deconditioning ?-Will need PT eval ? ? ?DVT prophylaxis: Eliquis ?Code Status: Full ?Family Communication: None at bedside ?Disposition Plan: ?Status is:  inpatient because: Of severity of illness.  Need for IV steroids.  Possible discharge home in 1 to 2 days if clinically improved. ? ? ?Consultants: Cardiology/GI ? ?Procedures: None ? ?Antimicrobials:  ?Anti-infectives (From admission, onward)  ? ? Start     Dose/Rate Route Frequency Ordered Stop  ? 10/20/21 1000  azithromycin (ZITHROMAX) tablet 500 mg       ? 500 mg Oral Daily 10/20/21 0758    ? 10/18/21 2330  methenamine (MANDELAMINE) tablet 1 g       ? 1 g Oral 2 times daily 10/18/21 2317    ? 10/18/21 2215  cefTRIAXone (ROCEPHIN) 1 g in sodium chloride 0.9 % 100 mL IVPB  Status:  Discontinued       ? 1 g ?200 mL/hr over 30 Minutes Intravenous Every 24 hours 10/18/21 2209 10/20/21 0758  ? ?  ? ? ? ?Subjective: ?  Patient seen and examined at bedside.  Poor historian.  Still feels short of breath with exertion but feeling better since admission.  Has intermittent cough.  No fever, abdominal and, vomiting.   ? ?Objective: ?Vitals:  ? 10/20/21 1930 10/20/21 1957 10/21/21 0056 10/21/21 0417  ?BP:  (!) 144/60 128/86 119/65  ?Pulse:  96 88  83  ?Resp:  '18 18 15  '$ ?Temp:  97.7 ?F (36.5 ?C) 98 ?F (36.7 ?C) 98.1 ?F (36.7 ?C)  ?TempSrc:  Oral Oral Oral  ?SpO2: 99% 100% 100% 100%  ?Weight:      ?Height:      ? ? ?Intake/Output Summary (Last 24 hours) at 10/21/2021 0753 ?Last data filed at 10/20/2021 1753 ?Gross per 24 hour  ?Intake 3246 ml  ?Output 2025 ml  ?Net 1221 ml  ? ? ?Filed Weights  ? 10/18/21 2040  ?Weight: 72.6 kg  ? ? ?Examination: ? ?General: On 2 L oxygen via nasal cannula.  No distress.  Looks chronically ill and deconditioned.  Poor historian. ?ENT/neck: No thyromegaly.  JVD is not elevated  ?respiratory: Decreased breath sounds at bases bilaterally with some crackles; no wheezing CVS: S1-S2 heard, rate controlled currently ?Abdominal: Soft, nontender, slightly distended; no organomegaly, normal bowel sounds are heard ?Extremities: Trace lower extremity edema; no cyanosis  ?CNS: Awake and alert.  Slow to respond.  No focal neurologic deficit.  Moves extremities ?Lymph: No obvious lymphadenopathy ?Skin: No obvious ecchymosis/lesions  ?psych: Currently not agitated.  Affect is mostly flat. ?musculoskeletal: No obvious joint swelling/deformity ? ? ? ?Data Reviewed: I have personally reviewed following labs and imaging studies ? ?CBC: ?Recent Labs  ?Lab 10/18/21 ?1253 10/19/21 ?0716 10/19/21 ?2045 10/20/21 ?2956 10/21/21 ?2130  ?WBC 7.0 9.0  --  14.3* 11.3*  ?NEUTROABS 4.6  --   --  13.1* 10.2*  ?HGB 8.8* 7.2* 9.6* 9.8* 10.2*  ?HCT 31.2* 24.7* 31.2* 33.0* 34.0*  ?MCV 86.9 84.3  --  86.8 85.6  ?PLT 202 186  --  229 214  ? ? ?Basic Metabolic Panel: ?Recent Labs  ?Lab 10/18/21 ?1253 10/20/21 ?8657 10/21/21 ?8469  ?NA 137 137 135  ?K 4.3 4.7 5.6*  ?CL 110 107 104  ?CO2 21* 20* 23  ?GLUCOSE 123* 146* 167*  ?BUN '10 16 22  '$ ?CREATININE 1.51* 1.49* 1.74*  ?CALCIUM 8.6* 8.8* 9.0  ?MG  --  2.6* 2.7*  ? ? ?GFR: ?Estimated Creatinine Clearance: 40.6 mL/min (A) (by C-G formula based on SCr of 1.74 mg/dL (H)). ?Liver Function Tests: ?Recent Labs  ?Lab  10/20/21 ?6295  ?AST 22  ?ALT 14  ?ALKPHOS 66  ?BILITOT 0.2*  ?PROT 7.6  ?ALBUMIN 3.8  ? ? ?No results for input(s): LIPASE, AMYLASE in the last 168 hours. ?No results for input(s): AMMONIA in the last 168 hours. ?Coagulation Profile: ?No results for input(s): INR, PROTIME in the last 168 hours. ?Cardiac Enzymes: ?No results for input(s): CKTOTAL, CKMB, CKMBINDEX, TROPONINI in the last 168 hours. ?BNP (last 3 results) ?No results for input(s): PROBNP in the last 8760 hours. ?HbA1C: ?No results for input(s): HGBA1C in the last 72 hours. ?CBG: ?No results for input(s): GLUCAP in the last 168 hours. ?Lipid Profile: ?No results for input(s): CHOL, HDL, LDLCALC, TRIG, CHOLHDL, LDLDIRECT in the last 72 hours. ?Thyroid Function Tests: ?No results for input(s): TSH, T4TOTAL, FREET4, T3FREE, THYROIDAB in the last 72 hours. ?Anemia Panel: ?No results for input(s): VITAMINB12, FOLATE, FERRITIN, TIBC, IRON, RETICCTPCT in the last 72 hours. ?Sepsis Labs: ?No results for  input(s): PROCALCITON, LATICACIDVEN in the last 168 hours. ? ?Recent Results (from the past 240 hour(s))  ?Resp Panel by RT-PCR (Flu A&B, Covid) Nasopharyngeal Swab     Status: None  ? Collection Time: 10/18/21  1:21 PM  ? Specimen: Nasopharyngeal Swab; Nasopharyngeal(NP) swabs in vial transport medium  ?Result Value Ref Range Status  ? SARS Coronavirus 2 by RT PCR NEGATIVE NEGATIVE Final  ?  Comment: (NOTE) ?SARS-CoV-2 target nucleic acids are NOT DETECTED. ? ?The SARS-CoV-2 RNA is generally detectable in upper respiratory ?specimens during the acute phase of infection. The lowest ?concentration of SARS-CoV-2 viral copies this assay can detect is ?138 copies/mL. A negative result does not preclude SARS-Cov-2 ?infection and should not be used as the sole basis for treatment or ?other patient management decisions. A negative result may occur with  ?improper specimen collection/handling, submission of specimen other ?than nasopharyngeal swab, presence of viral  mutation(s) within the ?areas targeted by this assay, and inadequate number of viral ?copies(<138 copies/mL). A negative result must be combined with ?clinical observations, patient history, and epidemiological ?info

## 2021-10-22 DIAGNOSIS — E875 Hyperkalemia: Secondary | ICD-10-CM

## 2021-10-22 DIAGNOSIS — R079 Chest pain, unspecified: Secondary | ICD-10-CM | POA: Diagnosis not present

## 2021-10-22 DIAGNOSIS — D500 Iron deficiency anemia secondary to blood loss (chronic): Secondary | ICD-10-CM | POA: Diagnosis not present

## 2021-10-22 DIAGNOSIS — J9611 Chronic respiratory failure with hypoxia: Secondary | ICD-10-CM | POA: Diagnosis not present

## 2021-10-22 DIAGNOSIS — J441 Chronic obstructive pulmonary disease with (acute) exacerbation: Secondary | ICD-10-CM | POA: Diagnosis not present

## 2021-10-22 LAB — CBC WITH DIFFERENTIAL/PLATELET
Abs Immature Granulocytes: 0.1 10*3/uL — ABNORMAL HIGH (ref 0.00–0.07)
Basophils Absolute: 0 10*3/uL (ref 0.0–0.1)
Basophils Relative: 0 %
Eosinophils Absolute: 0 10*3/uL (ref 0.0–0.5)
Eosinophils Relative: 0 %
HCT: 34 % — ABNORMAL LOW (ref 39.0–52.0)
Hemoglobin: 10 g/dL — ABNORMAL LOW (ref 13.0–17.0)
Immature Granulocytes: 1 %
Lymphocytes Relative: 8 %
Lymphs Abs: 0.9 10*3/uL (ref 0.7–4.0)
MCH: 25.1 pg — ABNORMAL LOW (ref 26.0–34.0)
MCHC: 29.4 g/dL — ABNORMAL LOW (ref 30.0–36.0)
MCV: 85.4 fL (ref 80.0–100.0)
Monocytes Absolute: 0.5 10*3/uL (ref 0.1–1.0)
Monocytes Relative: 4 %
Neutro Abs: 9.8 10*3/uL — ABNORMAL HIGH (ref 1.7–7.7)
Neutrophils Relative %: 87 %
Platelets: 220 10*3/uL (ref 150–400)
RBC: 3.98 MIL/uL — ABNORMAL LOW (ref 4.22–5.81)
RDW: 17.3 % — ABNORMAL HIGH (ref 11.5–15.5)
WBC: 11.2 10*3/uL — ABNORMAL HIGH (ref 4.0–10.5)
nRBC: 0 % (ref 0.0–0.2)

## 2021-10-22 LAB — BASIC METABOLIC PANEL
Anion gap: 8 (ref 5–15)
BUN: 26 mg/dL — ABNORMAL HIGH (ref 8–23)
CO2: 24 mmol/L (ref 22–32)
Calcium: 8.9 mg/dL (ref 8.9–10.3)
Chloride: 104 mmol/L (ref 98–111)
Creatinine, Ser: 1.8 mg/dL — ABNORMAL HIGH (ref 0.61–1.24)
GFR, Estimated: 40 mL/min — ABNORMAL LOW (ref >60)
Glucose, Bld: 151 mg/dL — ABNORMAL HIGH (ref 70–99)
Potassium: 4.5 mmol/L (ref 3.5–5.1)
Sodium: 136 mmol/L (ref 135–145)

## 2021-10-22 LAB — MAGNESIUM: Magnesium: 2.6 mg/dL — ABNORMAL HIGH (ref 1.7–2.4)

## 2021-10-22 MED ORDER — METHYLPREDNISOLONE SODIUM SUCC 40 MG IJ SOLR
40.0000 mg | Freq: Two times a day (BID) | INTRAMUSCULAR | Status: DC
  Administered 2021-10-22: 40 mg via INTRAVENOUS
  Filled 2021-10-22: qty 1

## 2021-10-22 NOTE — TOC CM/SW Note (Signed)
?  Transition of Care (TOC) Screening Note ? ? ?Patient Details  ?Name: Gregory Christensen ?Date of Birth: [birth year] ? ? ?Transition of Care (TOC) CM/SW Contact:    ?Ross Ludwig, LCSW ?Phone Number: ?10/22/2021, 4:36 PM ? ? ? ?Transition of Care Department [facility]) has reviewed patient and no TOC needs have been identified at this time. We will continue to monitor patient advancement through interdisciplinary progression rounds. If new patient transition needs arise, please place a TOC consult. ?  ?

## 2021-10-22 NOTE — Progress Notes (Signed)
?PROGRESS NOTE ? ? ? ?Gregory Christensen  SJG:[id number] DOB: [birth year] DOA: 10/18/2021 ?PCP: Vernie Shanks, MD  ? ?Brief Narrative:  ? 71 y.o. male with medical history significant of COPD with chronic hypoxia on 2 L with ongoing tobacco use, history of PE/DVT on Eliquis, GI bleed with AVMs, hypertension, CAD, and type 2 diabetes presented with worsening shortness of breath and chest pain.  Patient was referred to the hospital by pulmonary.  On presentation.  Influenza and COVID testing were negative.  Hemoglobin was 8.8.  Chest x-ray showed emphysema but no acute findings.  He was started on IV Solu-Medrol.  Cardiology and subsequently GI were consulted. ? ?Assessment & Plan: ?  ?COPD exacerbation ?Chronic respiratory failure with hypoxia ?-Normally wears 2 L oxygen by nasal cannula at home.  Currently on the same.  He was referred to the hospital by pulmonary clinic because of worsening shortness of breath ?-Chest x-ray negative for acute findings.  COVID-19 and influenza testing negative ?-Respiratory status improving.  Decrease Solu-Medrol to 40 mg IV every 12 hours.  Continue nebs and montelukast.  Continue oral Zithromax. ? ?Chest pain ?History of CAD ?-Has had ongoing chest pain for the past 2 months.  Apparently not a candidate for any procedures as per cardiology. ?-Continue as needed nitroglycerin.  Continue statin, isosorbide mononitrate ?-Cardiology evaluation appreciated: Recommended continuing supportive care with medical management.  Outpatient follow-up with cardiology to consider coronary CTA if renal function improves.  Cardiology signed off on 10/19/2021. ? ?Acute on chronic anemia ?Melena/history of small bowel AVMs ?-Has had recent extensive work-up including EGD and capsule endoscopy in December 2022 which had shown mild gastritis, duodenitis and nonbleeding AVMs.   ?-Status post 1 unit packed red cell transfusion on 10/19/2021 for hemoglobin of 7.2.  Hemoglobin 10.2 today.  Monitor H&H. ?-GI  recommended to continue conservative management.  Continue oral Protonix twice a day.  Outpatient follow-up with GI.  Will need outpatient hematology evaluation and follow-up as well as patient might have recurrent anemia from intermittent small bowel AVMs bleed.  GI signed off on 10/21/2021. ? ?CKD stage IIIa ?-Creatinine slightly elevated today.  Monitor. ? ?Hyperkalemia ?-Questionable cause.  Received 1 dose of Lokelma on 10/21/2020.  Resolved ? ?History of PE/DVT  ?-Eliquis held on 10/19/2021 by cardiology.  Resumed Eliquis from 10/21/2021. ? ?Leukocytosis ?-Possibly reactive.  Improving. ? ?Hyperlipidemia ?-Continue ezetimibe and statin ? ?Hypertension ?-Blood pressure currently stable.  Continue isosorbide mononitrate. ? ?BPH ?-Continue finasteride and tamsulosin ? ?Generalized deconditioning ?-PT eval pending. ? ?DVT prophylaxis: Eliquis ?Code Status: Full ?Family Communication: None at bedside ?Disposition Plan: ?Status is:  inpatient because: Of severity of illness.  Need for IV steroids.  Possible discharge home in 1 to 2 days if clinically improved.  PT eval pending. ? ? ?Consultants: Cardiology/GI ? ?Procedures: None ? ?Antimicrobials:  ?Anti-infectives (From admission, onward)  ? ? Start     Dose/Rate Route Frequency Ordered Stop  ? 10/20/21 1000  azithromycin (ZITHROMAX) tablet 500 mg       ? 500 mg Oral Daily 10/20/21 0758    ? 10/18/21 2330  methenamine (MANDELAMINE) tablet 1 g  Status:  Discontinued       ? 1 g Oral 2 times daily 10/18/21 2317 10/21/21 1008  ? 10/18/21 2215  cefTRIAXone (ROCEPHIN) 1 g in sodium chloride 0.9 % 100 mL IVPB  Status:  Discontinued       ? 1 g ?200 mL/hr over 30 Minutes Intravenous Every 24 hours 10/18/21 2209  10/20/21 0758  ? ?  ? ? ? ?Subjective: ?Patient seen and examined at bedside.  Poor historian.  Breathing is improving but still has some shortness of breath with exertion.  No fever, vomiting or worsening abdominal pain reported. ?Objective: ?Vitals:  ? 10/21/21 1945  10/21/21 1956 10/22/21 0334 10/22/21 0721  ?BP:  120/76 (!) 162/89   ?Pulse:  79 85   ?Resp:  16 18   ?Temp:  98.1 ?F (36.7 ?C) 98 ?F (36.7 ?C)   ?TempSrc:  Oral Oral   ?SpO2: 98% 100% 100% 95%  ?Weight:      ?Height:      ? ? ?Intake/Output Summary (Last 24 hours) at 10/22/2021 0937 ?Last data filed at 10/22/2021 0800 ?Gross per 24 hour  ?Intake 240 ml  ?Output 925 ml  ?Net -685 ml  ? ? ?Filed Weights  ? 10/18/21 2040  ?Weight: 72.6 kg  ? ? ?Examination: ? ?General: No acute distress.  Still on 2 L oxygen via nasal cannula.  Looks chronically ill and deconditioned.  Poor historian. ?ENT/neck: No elevated JVP.  No palpable thyromegaly. ?respiratory: Bilateral decreased breath sounds at bases with very minimal scattered wheezing  ?CVS: Currently rate controlled; S1-S2 heard  ?abdominal: Soft, nontender, distended slightly; no organomegaly, bowel sounds heard ?extremities: No clubbing; mild lower extremity edema present ?CNS: Alert and answers some questions.  Still slow to respond.  No focal neurologic deficit.  Moving extremities ?Lymph: No palpable lymphadenopathy  ?skin: No obvious petechiae/rashes ?psych: Flat affect.  No signs of agitation currently.   ?Musculoskeletal: No obvious joint erythema/tenderness ? ? ? ?Data Reviewed: I have personally reviewed following labs and imaging studies ? ?CBC: ?Recent Labs  ?Lab 10/18/21 ?1253 10/19/21 ?0716 10/19/21 ?2045 10/20/21 ?6222 10/21/21 ?9798 10/22/21 ?9211  ?WBC 7.0 9.0  --  14.3* 11.3* 11.2*  ?NEUTROABS 4.6  --   --  13.1* 10.2* 9.8*  ?HGB 8.8* 7.2* 9.6* 9.8* 10.2* 10.0*  ?HCT 31.2* 24.7* 31.2* 33.0* 34.0* 34.0*  ?MCV 86.9 84.3  --  86.8 85.6 85.4  ?PLT 202 186  --  229 214 220  ? ? ?Basic Metabolic Panel: ?Recent Labs  ?Lab 10/18/21 ?1253 10/20/21 ?9417 10/21/21 ?4081 10/22/21 ?4481  ?NA 137 137 135 136  ?K 4.3 4.7 5.6* 4.5  ?CL 110 107 104 104  ?CO2 21* 20* 23 24  ?GLUCOSE 123* 146* 167* 151*  ?BUN '10 16 22 '$ 26*  ?CREATININE 1.51* 1.49* 1.74* 1.80*  ?CALCIUM 8.6*  8.8* 9.0 8.9  ?MG  --  2.6* 2.7* 2.6*  ? ? ?GFR: ?Estimated Creatinine Clearance: 39.2 mL/min (A) (by C-G formula based on SCr of 1.8 mg/dL (H)). ?Liver Function Tests: ?Recent Labs  ?Lab 10/20/21 ?8563  ?AST 22  ?ALT 14  ?ALKPHOS 66  ?BILITOT 0.2*  ?PROT 7.6  ?ALBUMIN 3.8  ? ? ?No results for input(s): LIPASE, AMYLASE in the last 168 hours. ?No results for input(s): AMMONIA in the last 168 hours. ?Coagulation Profile: ?No results for input(s): INR, PROTIME in the last 168 hours. ?Cardiac Enzymes: ?No results for input(s): CKTOTAL, CKMB, CKMBINDEX, TROPONINI in the last 168 hours. ?BNP (last 3 results) ?No results for input(s): PROBNP in the last 8760 hours. ?HbA1C: ?No results for input(s): HGBA1C in the last 72 hours. ?CBG: ?No results for input(s): GLUCAP in the last 168 hours. ?Lipid Profile: ?No results for input(s): CHOL, HDL, LDLCALC, TRIG, CHOLHDL, LDLDIRECT in the last 72 hours. ?Thyroid Function Tests: ?No results for input(s): TSH, T4TOTAL, FREET4, T3FREE,  THYROIDAB in the last 72 hours. ?Anemia Panel: ?No results for input(s): VITAMINB12, FOLATE, FERRITIN, TIBC, IRON, RETICCTPCT in the last 72 hours. ?Sepsis Labs: ?No results for input(s): PROCALCITON, LATICACIDVEN in the last 168 hours. ? ?Recent Results (from the past 240 hour(s))  ?Resp Panel by RT-PCR (Flu A&B, Covid) Nasopharyngeal Swab     Status: None  ? Collection Time: 10/18/21  1:21 PM  ? Specimen: Nasopharyngeal Swab; Nasopharyngeal(NP) swabs in vial transport medium  ?Result Value Ref Range Status  ? SARS Coronavirus 2 by RT PCR NEGATIVE NEGATIVE Final  ?  Comment: (NOTE) ?SARS-CoV-2 target nucleic acids are NOT DETECTED. ? ?The SARS-CoV-2 RNA is generally detectable in upper respiratory ?specimens during the acute phase of infection. The lowest ?concentration of SARS-CoV-2 viral copies this assay can detect is ?138 copies/mL. A negative result does not preclude SARS-Cov-2 ?infection and should not be used as the sole basis for treatment  or ?other patient management decisions. A negative result may occur with  ?improper specimen collection/handling, submission of specimen other ?than nasopharyngeal swab, presence of viral mutation(s) within the

## 2021-10-22 NOTE — Evaluation (Signed)
Physical Therapy Evaluation ?Patient Details ?Name: Gregory Christensen ?MRN: [id number] ?DOB: Jan 21, 1951 ?Today's Date: 10/22/2021 ? ?History of Present Illness ? Pt admitted from home with acute on chronic respiratory failure and hx of aortic atherosclerosis, CAD. CKD, COPD - O2 dependent at home on 2L, RCR, aned chronic back pain  ?Clinical Impression ? Pt admitted as above and presenting with functional mobility limitations 2* decreased activity tolerance and mild ambulatory balance deficits.  This date, pt up to ambulate 200' in hall with RW and 3L O2 - pt with marked increased in WOB and experiencing SOB but O2 sats maintained over 90% and HR max at 106.  Pt states he feels close to baseline regarding ability to mobilize but more SOB than normal doing so.  Pt hopes to progress to dc home to prior living arrangement with assist of mother. ?   ? ?Recommendations for follow up therapy are one component of a multi-disciplinary discharge planning process, led by the attending physician.  Recommendations may be updated based on patient status, additional functional criteria and insurance authorization. ? ?Follow Up Recommendations No PT follow up ? ?  ?Assistance Recommended at Discharge PRN  ?Patient can return home with the following ?   ? ?  ?Equipment Recommendations None recommended by PT  ?Recommendations for Other Services ?    ?  ?Functional Status Assessment    ? ?  ?Precautions / Restrictions Precautions ?Precautions: Fall ?Precaution Comments: monitor sats ?Restrictions ?Weight Bearing Restrictions: No  ? ?  ? ?Mobility ? Bed Mobility ?Overal bed mobility: Modified Independent ?  ?  ?  ?  ?  ?  ?General bed mobility comments: no physical assist ?  ? ?Transfers ?Overall transfer level: Needs assistance ?Equipment used: None ?Transfers: Sit to/from Stand ?Sit to Stand: Modified independent (Device/Increase time) ?  ?  ?  ?  ?  ?  ?  ? ?Ambulation/Gait ?Ambulation/Gait assistance: Min guard, Supervision ?Gait  Distance (Feet): 200 Feet ?Assistive device: Rolling walker (2 wheels) ?Gait Pattern/deviations: Step-through pattern, Decreased step length - right, Decreased step length - left, Shuffle, Trunk flexed ?  ?  ?  ?General Gait Details: min cues for posture and position from RW; ? ?Stairs ?  ?  ?  ?  ?  ? ?Wheelchair Mobility ?  ? ?Modified Rankin (Stroke Patients Only) ?  ? ?  ? ?Balance Overall balance assessment: Needs assistance ?Sitting-balance support: No upper extremity supported, Feet supported ?Sitting balance-Leahy Scale: Normal ?  ?  ?Standing balance support: No upper extremity supported ?Standing balance-Leahy Scale: Fair ?  ?  ?  ?  ?  ?  ?  ?  ?  ?  ?  ?  ?   ? ? ? ?Pertinent Vitals/Pain Pain Assessment ?Pain Assessment: No/denies pain  ? ? ?Home Living Family/patient expects to be discharged to:: Private residence ?Living Arrangements: Parent ?Available Help at Discharge: Family;Available PRN/intermittently ?Type of Home: House ?Home Access: Stairs to enter ?Entrance Stairs-Rails: None ?Entrance Stairs-Number of Steps: 2 in front; 4 in back ?  ?Home Layout: One level ?Home Equipment: Conservation officer, nature (2 wheels);Rollator (4 wheels);BSC/3in1;Cane - single point;Grab bars - tub/shower;Wheelchair - manual ?Additional Comments: home O2 @ 2L  ?  ?Prior Function Prior Level of Function : Independent/Modified Independent ?  ?  ?  ?  ?  ?  ?Mobility Comments: "I take my cane and my oxygen tank wherever I go" ?  ?  ? ? ?Hand Dominance  ? Dominant Hand: Right ? ?  ?  Extremity/Trunk Assessment  ? Upper Extremity Assessment ?Upper Extremity Assessment: Overall WFL for tasks assessed ?  ? ?Lower Extremity Assessment ?Lower Extremity Assessment: Overall WFL for tasks assessed ?  ? ?Cervical / Trunk Assessment ?Cervical / Trunk Assessment: Kyphotic  ?Communication  ? Communication: No difficulties  ?Cognition Arousal/Alertness: Awake/alert ?Behavior During Therapy: [facility] for tasks assessed/performed ?Overall Cognitive  Status: Within Functional Limits for tasks assessed ?  ?  ?  ?  ?  ?  ?  ?  ?  ?  ?  ?  ?  ?  ?  ?  ?  ?  ?  ? ?  ?General Comments   ? ?  ?Exercises    ? ?Assessment/Plan  ?  ?PT Assessment Patient needs continued PT services  ?PT Problem List Decreased activity tolerance;Decreased balance ? ?   ?  ?PT Treatment Interventions DME instruction;Gait training;Stair training;Functional mobility training;Therapeutic activities;Therapeutic exercise;Patient/family education;Balance training   ? ?PT Goals (Current goals can be found in the Care Plan section)  ?Acute Rehab PT Goals ?Patient Stated Goal: Resume previous lifestyle ?PT Goal Formulation: With patient ?Time For Goal Achievement: 11/05/21 ?Potential to Achieve Goals: Good ? ?  ?Frequency Min 3X/week ?  ? ? ?Co-evaluation   ?  ?  ?  ?  ? ? ?  ?AM-PAC PT "6 Clicks" Mobility  ?Outcome Measure Help needed turning from your back to your side while in a flat bed without using bedrails?: None ?Help needed moving from lying on your back to sitting on the side of a flat bed without using bedrails?: None ?Help needed moving to and from a bed to a chair (including a wheelchair)?: A Little ?Help needed standing up from a chair using your arms (e.g., wheelchair or bedside chair)?: A Little ?Help needed to walk in hospital room?: A Little ?Help needed climbing 3-5 steps with a railing? : A Little ?6 Click Score: 20 ? ?  ?End of Session Equipment Utilized During Treatment: Gait belt;Oxygen ?Activity Tolerance: Patient tolerated treatment well;Other (comment) (SOB) ?Patient left: in chair;with call bell/phone within reach;with chair alarm set ?Nurse Communication: Mobility status ?PT Visit Diagnosis: Unsteadiness on feet (R26.81) ?  ? ?Time: 6294-7654 ?PT Time Calculation (min) (ACUTE ONLY): 16 min ? ? ?Charges:   PT Evaluation ?$PT Eval Low Complexity: 1 Low ?  ?  ?   ? ? ?Debe Coder PT ?Acute Rehabilitation Services ?Pager [phone] ?Office  [phone] ? ? ?Malachy Coleman ?10/22/2021, 1:15 PM ? ?

## 2021-10-23 DIAGNOSIS — D500 Iron deficiency anemia secondary to blood loss (chronic): Secondary | ICD-10-CM | POA: Diagnosis not present

## 2021-10-23 DIAGNOSIS — J441 Chronic obstructive pulmonary disease with (acute) exacerbation: Secondary | ICD-10-CM | POA: Diagnosis not present

## 2021-10-23 DIAGNOSIS — J9611 Chronic respiratory failure with hypoxia: Secondary | ICD-10-CM | POA: Diagnosis not present

## 2021-10-23 DIAGNOSIS — R079 Chest pain, unspecified: Secondary | ICD-10-CM | POA: Diagnosis not present

## 2021-10-23 LAB — CBC WITH DIFFERENTIAL/PLATELET
Abs Immature Granulocytes: 0.15 10*3/uL — ABNORMAL HIGH (ref 0.00–0.07)
Basophils Absolute: 0 10*3/uL (ref 0.0–0.1)
Basophils Relative: 0 %
Eosinophils Absolute: 0 10*3/uL (ref 0.0–0.5)
Eosinophils Relative: 0 %
HCT: 32.8 % — ABNORMAL LOW (ref 39.0–52.0)
Hemoglobin: 9.9 g/dL — ABNORMAL LOW (ref 13.0–17.0)
Immature Granulocytes: 1 %
Lymphocytes Relative: 8 %
Lymphs Abs: 1 10*3/uL (ref 0.7–4.0)
MCH: 25.3 pg — ABNORMAL LOW (ref 26.0–34.0)
MCHC: 30.2 g/dL (ref 30.0–36.0)
MCV: 83.7 fL (ref 80.0–100.0)
Monocytes Absolute: 1.1 10*3/uL — ABNORMAL HIGH (ref 0.1–1.0)
Monocytes Relative: 9 %
Neutro Abs: 10.4 10*3/uL — ABNORMAL HIGH (ref 1.7–7.7)
Neutrophils Relative %: 82 %
Platelets: 208 10*3/uL (ref 150–400)
RBC: 3.92 MIL/uL — ABNORMAL LOW (ref 4.22–5.81)
RDW: 17 % — ABNORMAL HIGH (ref 11.5–15.5)
WBC: 12.7 10*3/uL — ABNORMAL HIGH (ref 4.0–10.5)
nRBC: 0 % (ref 0.0–0.2)

## 2021-10-23 LAB — BASIC METABOLIC PANEL
Anion gap: 7 (ref 5–15)
BUN: 32 mg/dL — ABNORMAL HIGH (ref 8–23)
CO2: 24 mmol/L (ref 22–32)
Calcium: 8.4 mg/dL — ABNORMAL LOW (ref 8.9–10.3)
Chloride: 104 mmol/L (ref 98–111)
Creatinine, Ser: 1.78 mg/dL — ABNORMAL HIGH (ref 0.61–1.24)
GFR, Estimated: 41 mL/min — ABNORMAL LOW (ref >60)
Glucose, Bld: 158 mg/dL — ABNORMAL HIGH (ref 70–99)
Potassium: 4.4 mmol/L (ref 3.5–5.1)
Sodium: 135 mmol/L (ref 135–145)

## 2021-10-23 LAB — MAGNESIUM: Magnesium: 2.6 mg/dL — ABNORMAL HIGH (ref 1.7–2.4)

## 2021-10-23 MED ORDER — NICOTINE 21 MG/24HR TD PT24: 21.0000 mg | MEDICATED_PATCH | Freq: Every day | TRANSDERMAL | 0 refills | Status: DC

## 2021-10-23 MED ORDER — PANTOPRAZOLE SODIUM 40 MG PO TBEC: 40.0000 mg | DELAYED_RELEASE_TABLET | Freq: Two times a day (BID) | ORAL | Status: DC

## 2021-10-23 MED ORDER — ALBUTEROL SULFATE HFA 108 (90 BASE) MCG/ACT IN AERS: 2.0000 | INHALATION_SPRAY | RESPIRATORY_TRACT | 0 refills | Status: DC | PRN

## 2021-10-23 MED ORDER — PREDNISONE 20 MG PO TABS
40.0000 mg | ORAL_TABLET | Freq: Every day | ORAL | 0 refills | Status: AC
Start: 2021-10-23 — End: 2021-10-30

## 2021-10-23 NOTE — Discharge Summary (Signed)
Physician Discharge Summary  ?Gregory Christensen UKG:[id number] DOB: 05-15-1951 DOA: 10/18/2021 ? ?PCP: Vernie Shanks, MD ? ?Admit date: 10/18/2021 ?Discharge date: 10/23/2021 ? ?Admitted From: Home ?Disposition: Home ? ?Recommendations for Outpatient Follow-up:  ?Follow up with PCP in 1 week with repeat CBC/BMP ?Outpatient follow-up with cardiology/GI/pulmonary ?Follow up in ED if symptoms worsen or new appear ? ? ?Home Health: No ?Equipment/Devices: Continue supplemental oxygen via nasal cannula ? ?Discharge Condition: Stable ?CODE STATUS: Full ?Diet recommendation: Heart healthy ? ?Brief/Interim Summary: ? 72 y.o. male with medical history significant of COPD with chronic hypoxia on 2 L with ongoing tobacco use, history of PE/DVT on Eliquis, GI bleed with AVMs, hypertension, CAD, and type 2 diabetes presented with worsening shortness of breath and chest pain.  Patient was referred to the hospital by pulmonary.  On presentation.  Influenza and COVID testing were negative.  Hemoglobin was 8.8.  Chest x-ray showed emphysema but no acute findings.  He was started on IV Solu-Medrol.  Cardiology and subsequently GI were consulted.  Patient was managed conservatively.  He was transfused 1 unit packed red cells on 10/19/2021 for hemoglobin of 7.2.  Subsequently hemoglobin has remained stable.  Eliquis was held but subsequently resumed after clearance by GI.  Cardiology and GI have signed off.  His respiratory status has improved.  He feels much better and wants to go home today.  He will be discharged home on oral prednisone.  Outpatient follow-up with PCP/cardiology/GI and pulmonary. ? ?Discharge Diagnoses:  ? ?COPD exacerbation ?Chronic respiratory failure with hypoxia ?-Normally wears 2 L oxygen by nasal cannula at home.  Currently on the same.  He was referred to the hospital by pulmonary clinic because of worsening shortness of breath ?-Chest x-ray negative for acute findings.  COVID-19 and influenza testing  negative ?-Respiratory status improving.  Currently on tapering doses of IV steroids along with nebs and montelukast.  Discharged home on prednisone 40 mg daily for 7 days.  Continue home inhaled regimen.  Outpatient follow-up with pulmonary. ?  ?Chest pain ?History of CAD ?-Has had ongoing chest pain for the past 2 months.  Apparently not a candidate for any procedures as per cardiology. ?-Continue as needed nitroglycerin.  Continue statin, isosorbide mononitrate ?-Cardiology evaluation appreciated: Recommended continuing supportive care with medical management.  Outpatient follow-up with cardiology to consider coronary CTA if renal function improves.  Cardiology signed off on 10/19/2021. ?  ?Acute on chronic anemia ?Melena/history of small bowel AVMs ?-Has had recent extensive work-up including EGD and capsule endoscopy in December 2022 which had shown mild gastritis, duodenitis and nonbleeding AVMs.   ?-Status post 1 unit packed red cell transfusion on 10/19/2021 for hemoglobin of 7.2.  Hemoglobin 9.9 today.  Outpatient follow-up of CBC. ?-GI recommended to continue conservative management.  Continue oral Protonix twice a day.  Outpatient follow-up with GI.  Will need outpatient hematology evaluation and follow-up as well as patient might have recurrent anemia from intermittent small bowel AVMs bleed.  GI signed off on 10/21/2021. ?  ?CKD stage IIIa ?-Creatinine still slightly elevated today.  Outpatient follow-up. ? ?Hyperkalemia ?-Questionable cause.  Received 1 dose of Lokelma on 10/21/2020.  Resolved ?  ?History of PE/DVT  ?-Eliquis held on 10/19/2021 by cardiology.  Resumed Eliquis from 10/21/2021. ?  ?Leukocytosis ?-Possibly reactive.  Still mildly elevated. ?  ?Hyperlipidemia ?-Continue ezetimibe and statin ?  ?Hypertension ?-Blood pressure currently stable.  Continue isosorbide mononitrate. ? ?BPH ?-Continue finasteride and tamsulosin ?  ?Generalized deconditioning ?-Tolerated PT well. ? ?  Discharge  Instructions ? ?Discharge Instructions   ? ? Ambulatory referral to Cardiology   Complete by: As directed ?  ? Ambulatory referral to Pulmonology   Complete by: As directed ?  ? Reason for referral: Asthma/COPD  ? Diet - low sodium heart healthy   Complete by: As directed ?  ? Increase activity slowly   Complete by: As directed ?  ? ?  ? ?Allergies as of 10/23/2021   ?No Known Allergies ?  ? ?  ?Medication List  ?  ? ?STOP taking these medications   ? ?amoxicillin-clavulanate 875-125 MG tablet ?Commonly known as: AUGMENTIN ?  ?methenamine 1 g tablet ?Commonly known as: HIPREX ?  ?predniSONE 10 MG (21) Tbpk tablet ?Commonly known as: STERAPRED UNI-PAK 21 TAB ?Replaced by: predniSONE 20 MG tablet ?  ? ?  ? ?TAKE these medications   ? ?albuterol (2.5 MG/3ML) 0.083% nebulizer solution ?Commonly known as: PROVENTIL ?Take 3 mLs (2.5 mg total) by nebulization every 6 (six) hours as needed for wheezing or shortness of breath. ?What changed:  ?Another medication with the same name was changed. Make sure you understand how and when to take each. ?Another medication with the same name was removed. Continue taking this medication, and follow the directions you see here. ?  ?albuterol 108 (90 Base) MCG/ACT inhaler ?Commonly known as: VENTOLIN HFA ?Inhale 2 puffs into the lungs every 4 (four) hours as needed for wheezing or shortness of breath. ?What changed:  ?when to take this ?Another medication with the same name was removed. Continue taking this medication, and follow the directions you see here. ?  ?apixaban 5 MG Tabs tablet ?Commonly known as: Eliquis ?Take 1 tablet (5 mg total) by mouth 2 (two) times daily. ?  ?Artificial Tears 0.1-0.3 % Soln ?Generic drug: Dextran 70-Hypromellose ?Place 1 drop into both eyes daily as needed (dry eyes). ?  ?ascorbic acid 500 MG tablet ?Commonly known as: VITAMIN C ?Take 500 mg by mouth in the morning and at bedtime. ?  ?atorvastatin 40 MG tablet ?Commonly known as: LIPITOR ?Take 80 mg by  mouth every evening. ?What changed: Another medication with the same name was removed. Continue taking this medication, and follow the directions you see here. ?  ?diclofenac Sodium 1 % Gel ?Commonly known as: VOLTAREN ?Apply 2 g topically 2 (two) times daily as needed (Pain). ?  ?docusate sodium 100 MG capsule ?Commonly known as: COLACE ?Take 100 mg by mouth daily as needed for mild constipation. ?  ?ezetimibe 10 MG tablet ?Commonly known as: ZETIA ?Take 1 tablet (10 mg total) by mouth daily. ?  ?ferrous sulfate 325 (65 FE) MG tablet ?Take 1 tablet (325 mg total) by mouth daily. ?  ?finasteride 5 MG tablet ?Commonly known as: PROSCAR ?Take 5 mg by mouth daily. ?  ?fluticasone 50 MCG/ACT nasal spray ?Commonly known as: FLONASE ?Place into both nostrils as needed for allergies or rhinitis. ?  ?fluticasone-salmeterol 250-50 MCG/ACT Aepb ?Commonly known as: ADVAIR ?Inhale 1 puff into the lungs in the morning and at bedtime. ?  ?gabapentin 300 MG capsule ?Commonly known as: NEURONTIN ?Take 300 mg by mouth 3 (three) times daily. ?  ?levocetirizine 5 MG tablet ?Commonly known as: XYZAL ?Take 5 mg by mouth daily as needed for allergies. ?  ?lidocaine 5 % ?Commonly known as: LIDODERM ?Place 1 patch onto the skin every 12 (twelve) hours as needed (pain). ?  ?montelukast 10 MG tablet ?Commonly known as: SINGULAIR ?Take 10 mg by mouth  at bedtime. ?  ?nicotine 21 mg/24hr patch ?Commonly known as: NICODERM CQ - dosed in mg/24 hours ?Place 1 patch (21 mg total) onto the skin daily. ?  ?nitroGLYCERIN 0.4 MG SL tablet ?Commonly known as: NITROSTAT ?Take up to 3 tablets as needed for chest pain. ?What changed:  ?how much to take ?when to take this ?reasons to take this ?  ?pantoprazole 40 MG tablet ?Commonly known as: Protonix ?Take 1 tablet (40 mg total) by mouth 2 (two) times daily. ?  ?predniSONE 20 MG tablet ?Commonly known as: DELTASONE ?Take 2 tablets (40 mg total) by mouth daily with breakfast for 7 days. ?Replaces:  predniSONE 10 MG (21) Tbpk tablet ?  ?sildenafil 100 MG tablet ?Commonly known as: VIAGRA ?Take 100 mg by mouth daily as needed for erectile dysfunction. ?  ?Spiriva Respimat 2.5 MCG/ACT Aers ?Generic drug: Tiotropium Bromide Monohydrate

## 2021-10-24 ENCOUNTER — Telehealth: Payer: Self-pay | Admitting: Pulmonary Disease

## 2021-10-24 NOTE — Telephone Encounter (Signed)
Rx can be faxed to [phone] or fax directly to [facility] @ [phone].[name] ? ?

## 2021-10-25 NOTE — Telephone Encounter (Signed)
Tried to call [facility] but line rang once and then went to a busy tone. Will try to call back later. ?

## 2021-10-26 NOTE — Telephone Encounter (Signed)
Called and spoke with Gregory Christensen. She stated that she received a fax from our office but she was not sure who signed the form. Received form from [facility] [employer]. TP signed the order. She stated that she needed to have the NPI and TP's name on the RX.  ? ?The form already has TP's information on it. Will refax form.  ? ?Nothing further needed at time of call.   ? ? ?

## 2021-10-26 NOTE — Telephone Encounter (Signed)
Jessica from [facility] is calling back. Janett Billow phone number is [phone]. ?

## 2021-10-27 ENCOUNTER — Telehealth: Payer: Self-pay | Admitting: Pulmonary Disease

## 2021-10-27 NOTE — Telephone Encounter (Signed)
Fax was submitted yesterday. Will re-fax again today.  ?

## 2021-10-29 NOTE — Progress Notes (Signed)
?Cardiology Office Note:   ? ?Date:  10/31/2021  ? ?ID:  Gregory Christensen, DOB Jul 24, 1950, MRN [id number] ? ?PCP:  Vernie Shanks, MD ?  ?[facility] ?Cardiologist:  Werner Lean, MD    ? ?Referring MD: Aline August, MD  ? ?Chief Complaint: hospital follow-up chest pain ? ?History of Present Illness:   ? ?Gregory Christensen is a 71 y.o. male with a hx of PE and DVT on chronic anticoagulation, coronary artery calcification, aortic atherosclerosis, tobacco abuse, hypertension, PE, chronic hypoxia, COPD on home O2, diabetes, iron deficiency anemia, and GI bleed with small bowel AVMs.  ? ?He was referred for evaluation of coronary artery calcifications and chronic shortness of breath and evaluated by Dr. Acie Fredrickson on 02/25/2019 via telemedicine.  Lexiscan Myoview was low risk and he was cleared for an upcoming surgery.  ? ?He returned 05/12/2021 for evaluation of hypertension and was seen by Dr. Gasper Sells.  He reported that he stopped smoking 05/2020.  Having rare episodes of chest tightness that worsened in cold weather, improved with rescue inhaler.  Echo from 03/20/2021 revealed LVEF 60 to 65%, no RWMA, low normal RV, RV mildly enlarged, trivial MR.  ? ?At follow-up visit on 08/15/2021 Dr. Gasper Sells discussed limited options for procedural intervention given his bleeding issues, CKD, and active smoking (unable to do Lexiscan, cannot tolerate stress NM study, intervention of a positive lesion would be complicated by bleeding risk).  Amlodipine was stopped in the setting of hypotension. Goal hbg of 8, resume Eliquis after non-cardiac intervention and return in 4-5 months for follow-up.  ? ?Admission 4/19-4/24/23 for worsening shortness of breath with productive cough and chest pain Took nitroglycerin with some relief. Hs troponin negative x 2.  No acute ischemic change on EKG, ACS ruled out, felt not to be a good candidate for ischemic intervention due to ongoing/worsening anemia, high risk GI bleed as  well as CKD.  Hgb had dropped to 7.2 during admission, down from 8.8. Limited antianginal therapy given low blood pressure. Plan to re-evaluate option for coronary CT as an OP. Resumed Eliqius 10/21/21 ? ?Today, he is here alone for follow-up.  Reports he is doing well since hospital discharge. Occasional chest tightness that occurs 1 time per day or less, relieved by nitroglycerin. He is on home O2, feels that his dyspnea is stable. He denies lower extremity edema, fatigue, palpitations, melena, hematuria, hemoptysis, diaphoresis, weakness, presyncope, syncope, orthopnea, and PND.  He does not voice any specific concerns today. ? ?Past Medical History:  ?Diagnosis Date  ? Acute deep vein thrombosis (DVT) of left lower extremity ([place])   ? Acute hypoxemic respiratory failure ([place])   ? Anemia   ? Iron deficiency  ? Anxiety   ? Aortic atherosclerosis ([place])   ? Arthritis   ? Benign prostatic hyperplasia 08/28/2018  ? UNSPECIFIED WHETHER LOWER URINARY TRACT SYMPTOMS PRESENT  ? CAD (coronary artery disease)   ? Cancer [facility])   ? Chronic back pain   ? Chronic hip pain   ? CKD stage 3a 03/19/2021  ? Constipation   ? due to Iron M-W-F  ? COPD exacerbation ([place]) 10/18/2017  ? Dyspnea   ? Emphysema lung ([place])   ? Essential hypertension   ? GERD (gastroesophageal reflux disease)   ? Gout   ? Gross hematuria 09/08/2018  ? Head injury, acute, with loss of consciousness ([place])   ? unsure how long he was unresponsive  ? History of blood transfusion 05/2019  ? History of kidney  stones   ? History of tobacco abuse   ? Hypoxia   ? Insomnia   ? Oxygen dependent   ? 2l- 24/7  ? Pneumonia   ? in 1980's  ? Pre-diabetes   ? Pulmonary embolism ([place]) 05/31/2018  ? Renal mass   ? CONCERNING FOR RENAL CELL CARCINOMA DR. Lovena Neighbours  ? Respiratory distress   ? Respiratory failure, acute-on-chronic ([place]) 08/03/2018  ? Seasonal allergies   ? Sleep apnea   ? ? ?Past Surgical History:  ?Procedure Laterality Date  ? BIOPSY  06/19/2021  ? Procedure: BIOPSY;   Surgeon: Wilford Corner, MD;  Location: [place];  Service: Endoscopy;;  ? COLONOSCOPY WITH PROPOFOL N/A 09/27/2019  ? Procedure: COLONOSCOPY WITH PROPOFOL;  Surgeon: Irene Shipper, MD;  Location: [facility];  Service: Endoscopy;  Laterality: N/A;  ? CYSTOSCOPY/URETEROSCOPY/HOLMIUM LASER/STENT PLACEMENT Left 10/31/2018  ? Procedure: CYSTOSCOPY/URETEROSCOPY;  Surgeon: Ceasar Mons, MD;  Location: [facility];  Service: Urology;  Laterality: Left;  ? ESOPHAGOGASTRODUODENOSCOPY (EGD) WITH PROPOFOL N/A 09/27/2019  ? Procedure: ESOPHAGOGASTRODUODENOSCOPY (EGD) WITH PROPOFOL;  Surgeon: Irene Shipper, MD;  Location: [facility];  Service: Endoscopy;  Laterality: N/A;  ? ESOPHAGOGASTRODUODENOSCOPY (EGD) WITH PROPOFOL N/A 06/19/2021  ? Procedure: ESOPHAGOGASTRODUODENOSCOPY (EGD) WITH PROPOFOL;  Surgeon: Wilford Corner, MD;  Location: [place];  Service: Endoscopy;  Laterality: N/A;  ? GIVENS CAPSULE STUDY N/A 06/20/2021  ? Procedure: GIVENS CAPSULE STUDY;  Surgeon: Wilford Corner, MD;  Location: [facility];  Service: Endoscopy;  Laterality: N/A;  ? HERNIA REPAIR Left 1977  ? inguinal  ? HYDROCELE EXCISION Left 05/13/2019  ? Procedure: HYDROCELECTOMY ADULT;  Surgeon: Ceasar Mons, MD;  Location: [facility];  Service: Urology;  Laterality: Left;  ? [place]  ? EXTENSIVE  ? POLYPECTOMY  09/27/2019  ? Procedure: POLYPECTOMY;  Surgeon: Irene Shipper, MD;  Location: [facility];  Service: Endoscopy;;  ? ROBOT ASSITED LAPAROSCOPIC NEPHROURETERECTOMY Left 10/31/2018  ? Procedure: XI ROBOT ASSITED LAPAROSCOPIC NEPHROURETERECTOMY;  Surgeon: Ceasar Mons, MD;  Location: [facility];  Service: Urology;  Laterality: Left;  ONLY NEEDS 240 MIN FOR ALL PROCEDURES  ? SHOULDER ARTHROSCOPY WITH ROTATOR CUFF REPAIR Left 04/12/2020  ? Procedure: LEFT SHOULDER ARTHROSCOPY, DEBRIDEMENT, BICEPS TENODESIS, MINI OPEN ROTATOR CUFF TEAR REPAIR;  Surgeon: Meredith Pel, MD;  Location: [place];   Service: Orthopedics;  Laterality: Left;  ? ? ?Current Medications: ?Current Meds  ?Medication Sig  ? albuterol (PROVENTIL) (2.5 MG/3ML) 0.083% nebulizer solution Take 3 mLs (2.5 mg total) by nebulization every 6 (six) hours as needed for wheezing or shortness of breath.  ? albuterol (VENTOLIN HFA) 108 (90 Base) MCG/ACT inhaler Inhale 2 puffs into the lungs every 4 (four) hours as needed for wheezing or shortness of breath.  ? apixaban (ELIQUIS) 5 MG TABS tablet Take 1 tablet (5 mg total) by mouth 2 (two) times daily.  ? ascorbic acid (VITAMIN C) 500 MG tablet Take 500 mg by mouth in the morning and at bedtime.  ? atorvastatin (LIPITOR) 40 MG tablet Take 80 mg by mouth every evening.  ? Dextran 70-Hypromellose (ARTIFICIAL TEARS) 0.1-0.3 % SOLN Place 1 drop into both eyes daily as needed (dry eyes).  ? diclofenac Sodium (VOLTAREN) 1 % GEL Apply 2 g topically 2 (two) times daily as needed (Pain).  ? docusate sodium (COLACE) 100 MG capsule Take 100 mg by mouth daily as needed for mild constipation.  ? ezetimibe (ZETIA) 10 MG tablet Take 1 tablet (10 mg total) by mouth  daily.  ? ferrous sulfate 325 (65 FE) MG tablet Take 1 tablet (325 mg total) by mouth daily.  ? finasteride (PROSCAR) 5 MG tablet Take 5 mg by mouth daily.  ? fluticasone (FLONASE) 50 MCG/ACT nasal spray Place into both nostrils as needed for allergies or rhinitis.  ? fluticasone-salmeterol (ADVAIR) 250-50 MCG/ACT AEPB Inhale 1 puff into the lungs in the morning and at bedtime.  ? gabapentin (NEURONTIN) 300 MG capsule Take 300 mg by mouth 3 (three) times daily.  ? levocetirizine (XYZAL) 5 MG tablet Take 5 mg by mouth daily as needed for allergies.  ? lidocaine (LIDODERM) 5 % Place 1 patch onto the skin every 12 (twelve) hours as needed (pain).  ? metoprolol succinate (TOPROL XL) 25 MG 24 hr tablet Take 1 tablet (25 mg total) by mouth at bedtime.  ? montelukast (SINGULAIR) 10 MG tablet Take 10 mg by mouth at bedtime.  ? nicotine (NICODERM CQ - DOSED IN  MG/24 HOURS) 21 mg/24hr patch Place 1 patch (21 mg total) onto the skin daily.  ? nitroGLYCERIN (NITROSTAT) 0.4 MG SL tablet Take up to 3 tablets as needed for chest pain.  ? pantoprazole (PROTONIX) 40 MG

## 2021-10-30 ENCOUNTER — Telehealth: Payer: Self-pay | Admitting: Pulmonary Disease

## 2021-10-31 ENCOUNTER — Ambulatory Visit (INDEPENDENT_AMBULATORY_CARE_PROVIDER_SITE_OTHER): Payer: Medicare Other | Admitting: Nurse Practitioner

## 2021-10-31 ENCOUNTER — Encounter: Payer: Self-pay | Admitting: Nurse Practitioner

## 2021-10-31 ENCOUNTER — Telehealth: Payer: Self-pay | Admitting: *Deleted

## 2021-10-31 VITALS — BP 104/68 | HR 90 | Ht 73.0 in | Wt 160.4 lb

## 2021-10-31 DIAGNOSIS — E1159 Type 2 diabetes mellitus with other circulatory complications: Secondary | ICD-10-CM

## 2021-10-31 DIAGNOSIS — I2510 Atherosclerotic heart disease of native coronary artery without angina pectoris: Secondary | ICD-10-CM

## 2021-10-31 DIAGNOSIS — J449 Chronic obstructive pulmonary disease, unspecified: Secondary | ICD-10-CM

## 2021-10-31 DIAGNOSIS — I700 Atherosclerosis of aorta: Secondary | ICD-10-CM | POA: Diagnosis not present

## 2021-10-31 DIAGNOSIS — I2584 Coronary atherosclerosis due to calcified coronary lesion: Secondary | ICD-10-CM

## 2021-10-31 DIAGNOSIS — I152 Hypertension secondary to endocrine disorders: Secondary | ICD-10-CM

## 2021-10-31 DIAGNOSIS — I10 Essential (primary) hypertension: Secondary | ICD-10-CM | POA: Diagnosis not present

## 2021-10-31 DIAGNOSIS — D509 Iron deficiency anemia, unspecified: Secondary | ICD-10-CM | POA: Diagnosis not present

## 2021-10-31 MED ORDER — METOPROLOL SUCCINATE ER 25 MG PO TB24: 25.0000 mg | ORAL_TABLET | Freq: Every day | ORAL | 3 refills | Status: DC

## 2021-10-31 MED ORDER — ALBUTEROL SULFATE (2.5 MG/3ML) 0.083% IN NEBU: 2.5000 mg | INHALATION_SOLUTION | Freq: Four times a day (QID) | RESPIRATORY_TRACT | 4 refills | Status: DC | PRN

## 2021-10-31 NOTE — Telephone Encounter (Signed)
Albuterol RX has been printed, stamped and faxed to [facility].  ?

## 2021-10-31 NOTE — Telephone Encounter (Signed)
Call [facility] to get recent labs fax to office.  ?

## 2021-10-31 NOTE — Telephone Encounter (Signed)
[facility] calling a prescript that needs to be redated for the time after pt was seen  Gregory Christensen has called before and needdng to know who is going to sign this for pt to get nebulizer she can be reached @ [phone] fax [phone].[name] ? ?

## 2021-10-31 NOTE — Patient Instructions (Signed)
Medication Instructions:  ? ?START Toprol one(1) tablet by mouth ( 25 mg ) at bedtime.  ? ?*If you need a refill on your cardiac medications before your next appointment, please call your pharmacy* ? ?Lab Work: ? ?PLEASE GIVE THE [facility] FAX NUMBER [phone] TO SEND RECENT LABS TO OFFICE ?ATTENTION Jerriann Schrom ? ?If you have labs (blood work) drawn today and your tests are completely normal, you will receive your results only by: ?MyChart Message (if you have MyChart) OR ?A paper copy in the mail ?If you have any lab test that is abnormal or we need to change your treatment, we will call you to review the results. ? ?Follow-Up: ?At [facility], you and your health needs are our priority.  As part of our continuing mission to provide you with exceptional heart care, we have created designated Provider Care Teams.  These Care Teams include your primary Cardiologist (physician) and Advanced Practice Providers (APPs -  Physician Assistants and Nurse Practitioners) who all work together to provide you with the care you need, when you need it. ? ?We recommend signing up for the patient portal called "MyChart".  Sign up information is provided on this After Visit Summary.  MyChart is used to connect with patients for Virtual Visits (Telemedicine).  Patients are able to view lab/test results, encounter notes, upcoming appointments, etc.  Non-urgent messages can be sent to your provider as well.   ?To learn more about what you can do with MyChart, go to [email or web address].   ? ?Your next appointment:   ?1 month(s) ? ?The format for your next appointment:   ?In Person ? ?Provider:   ?Werner Lean, MD   ? ? ?Other Instructions ? ? ?Important Information About Sugar ? ? ? ? ?  ?

## 2021-10-31 NOTE — Telephone Encounter (Signed)
Pt stated to ask for [place]. ?

## 2021-11-03 DIAGNOSIS — K921 Melena: Secondary | ICD-10-CM | POA: Diagnosis not present

## 2021-11-06 DIAGNOSIS — N1832 Chronic kidney disease, stage 3b: Secondary | ICD-10-CM | POA: Diagnosis not present

## 2021-11-06 DIAGNOSIS — J9611 Chronic respiratory failure with hypoxia: Secondary | ICD-10-CM | POA: Diagnosis not present

## 2021-11-06 DIAGNOSIS — D649 Anemia, unspecified: Secondary | ICD-10-CM | POA: Diagnosis not present

## 2021-11-06 DIAGNOSIS — I10 Essential (primary) hypertension: Secondary | ICD-10-CM | POA: Diagnosis not present

## 2021-11-06 DIAGNOSIS — F17200 Nicotine dependence, unspecified, uncomplicated: Secondary | ICD-10-CM | POA: Diagnosis not present

## 2021-11-06 DIAGNOSIS — E785 Hyperlipidemia, unspecified: Secondary | ICD-10-CM | POA: Diagnosis not present

## 2021-11-06 DIAGNOSIS — Z9981 Dependence on supplemental oxygen: Secondary | ICD-10-CM | POA: Diagnosis not present

## 2021-11-06 DIAGNOSIS — J449 Chronic obstructive pulmonary disease, unspecified: Secondary | ICD-10-CM | POA: Diagnosis not present

## 2021-11-10 NOTE — Telephone Encounter (Signed)
Notes received from [facility] and given to Christen Bame, NP. ?

## 2021-11-13 ENCOUNTER — Ambulatory Visit (INDEPENDENT_AMBULATORY_CARE_PROVIDER_SITE_OTHER): Payer: Medicare Other | Admitting: Nurse Practitioner

## 2021-11-13 ENCOUNTER — Encounter: Payer: Self-pay | Admitting: Nurse Practitioner

## 2021-11-13 VITALS — BP 128/88 | HR 88 | Ht 73.0 in | Wt 162.0 lb

## 2021-11-13 DIAGNOSIS — J3089 Other allergic rhinitis: Secondary | ICD-10-CM | POA: Diagnosis not present

## 2021-11-13 DIAGNOSIS — J9611 Chronic respiratory failure with hypoxia: Secondary | ICD-10-CM

## 2021-11-13 DIAGNOSIS — D500 Iron deficiency anemia secondary to blood loss (chronic): Secondary | ICD-10-CM

## 2021-11-13 DIAGNOSIS — J449 Chronic obstructive pulmonary disease, unspecified: Secondary | ICD-10-CM

## 2021-11-13 DIAGNOSIS — J441 Chronic obstructive pulmonary disease with (acute) exacerbation: Secondary | ICD-10-CM

## 2021-11-13 NOTE — Assessment & Plan Note (Signed)
Most recent hemoglobin 9.9 which is stable from discharge.  Did receive 1 unit packed red blood cells during his stay.  Followed up with GI last week who are continuing with conservative management.  Advised to monitor symptoms closely. ?

## 2021-11-13 NOTE — Progress Notes (Signed)
? ?'@Patient'$  ID: Gregory Christensen, male    DOB: 28-Sep-1950, 71 y.o.   MRN: [id number] ? ?Chief Complaint  ?Patient presents with  ? Hospitalization Follow-up  ?  copd  ? ? ?Referring provider: ?Vernie Shanks, MD ? ?HPI: ?71 year old male, former smoker (28.5 pack years) followed for COPD and chronic respiratory failure.  He is a patient of Dr. August Albino and last seen in office on 10/18/2021 by Parrett NP.  Past medical history significant for DVT, PE on Eliquis, hypertension, CAD, DM 2, BPH, CKD stage III, IDA, HLD, diastolic dysfunction.  He is followed at the [facility] center. ? ?TEST/EVENTS:  ?2016 PFTs: FEV1 of 33%, ratio 51, DLCO 51.  Severe obstructive airway disease with severe diffusion defect ?A1AT in 2019 was normal ?03/20/2021 echocardiogram: EF 66 5%.  RV systolic function is low normal.  RV size is mildly enlarged. ? ?10/18/2021: OV with Parrett,NP for 9-monthfollow-up; reported acute symptoms with 2 weeks of increased wheezing, dry cough and shortness of breath.  Low-grade fever on and off.  Little energy and lightheadedness.  COVID testing was negative.  CXR showed COPD changes and a small nodular opacity in the right upper lobe, possible underlying pneumonia.  Significant symptom burden with presyncopal episodes and mild respiratory distress in office.  Patient high risk for decompensation; in need of higher level of care.  Transferred to emergency room for further evaluation. ? ?10/18/2021-10/23/2021: Hospitalization for GI bleed requiring blood transfusion.  Treated with conservative management and plans to follow-up with GI outpatient.  Also treated for AECOPD with IV steroids; no increased O2 requirements.  CXR without acute findings.  Respiratory status improved and discharged on 40 mg prednisone for 7 days. ? ?11/13/2021: Today-Hospital follow-up ?Patient presents today for hospital follow-up.  He was discharged on 10/23/2021 and feels like he has been doing significantly better since before his hospital  stay.  Breathing is overall back to his baseline.  Still has some DOE, but feels about normal for him.  He continues on 2 L supplemental O2 and has not had any increased O2 requirements.  He has a rare, minimally productive cough which is chronic for him.  He denies any recent fevers, hemoptysis, lower extremity swelling, wheezing.  He had a blood transfusion while hospitalized and hemoglobin has remained stable after discharge upon most recent labs.  Followed up with GI last week.  He has not had any more presyncopal episodes since being discharged.  He continues on Advair and Spiriva.  Uses his neb a few times a week.  No concerns or complaints today. ? ?No Known Allergies ? ?Immunization History  ?Administered Date(s) Administered  ? Fluad Quad(high Dose 65+) 03/21/2020  ? Influenza, High Dose Seasonal PF 06/10/2018, 04/02/2019  ? Influenza,inj,Quad PF,6+ Mos 06/05/2016, 03/28/2021  ? Influenza,inj,Quad PF,6-35 Mos 06/10/2018  ? Influenza,inj,quad, With Preservative 03/30/2019  ? Influenza-Unspecified 03/30/2013, 04/29/2014, 05/11/2015, 04/06/2019, 03/28/2021  ? Janssen (J&J) SARS-COV-2 Vaccination 10/05/2019  ? Moderna Sars-Covid-2 Vaccination 08/08/2020, 02/21/2021  ? Pneumococcal Conjugate-13 12/03/2017  ? Pneumococcal Polysaccharide-23 10/23/2013, 12/05/2018  ? Tdap 03/03/2011, 12/03/2017  ? Zoster Recombinat (Shingrix) 04/05/2020, 07/19/2020  ? ? ?Past Medical History:  ?Diagnosis Date  ? Acute deep vein thrombosis (DVT) of left lower extremity ([place]   ? Acute hypoxemic respiratory failure ([place]   ? Anemia   ? Iron deficiency  ? Anxiety   ? Aortic atherosclerosis ([place]   ? Arthritis   ? Benign prostatic hyperplasia 08/28/2018  ? UNSPECIFIED WHETHER LOWER URINARY TRACT SYMPTOMS PRESENT  ?  CAD (coronary artery disease)   ? [facility])   ? Chronic back pain   ? Chronic hip pain   ? CKD stage 3a 03/19/2021  ? Constipation   ? due to Iron M-W-F  ? COPD exacerbation ([place]) 10/18/2017  ? Dyspnea   ? Emphysema lung  ([place])   ? Essential hypertension   ? GERD (gastroesophageal reflux disease)   ? Gout   ? Gross hematuria 09/08/2018  ? Head injury, acute, with loss of consciousness ([place])   ? unsure how long he was unresponsive  ? History of blood transfusion 05/2019  ? History of kidney stones   ? History of tobacco abuse   ? Hypoxia   ? Insomnia   ? Oxygen dependent   ? 2l- 24/7  ? Pneumonia   ? in 1980's  ? Pre-diabetes   ? Pulmonary embolism ([place]) 05/31/2018  ? Renal mass   ? CONCERNING FOR RENAL CELL CARCINOMA DR. Lovena Neighbours  ? Respiratory distress   ? Respiratory failure, acute-on-chronic ([place]) 08/03/2018  ? Seasonal allergies   ? Sleep apnea   ? ? ?Tobacco History: ?Social History  ? ?Tobacco Use  ?Smoking Status Former  ? Packs/day: 0.50  ? Years: 57.00  ? Pack years: 28.50  ? Types: Cigarettes  ? Quit date: 05/02/2021  ? Years since quitting: 0.5  ?Smokeless Tobacco Never  ? ?Counseling given: Not Answered ? ? ?Outpatient Medications Prior to Visit  ?Medication Sig Dispense Refill  ? albuterol (PROVENTIL) (2.5 MG/3ML) 0.083% nebulizer solution Take 3 mLs (2.5 mg total) by nebulization every 6 (six) hours as needed for wheezing or shortness of breath. 180 mL 4  ? albuterol (VENTOLIN HFA) 108 (90 Base) MCG/ACT inhaler Inhale 2 puffs into the lungs every 4 (four) hours as needed for wheezing or shortness of breath. 1 each 0  ? apixaban (ELIQUIS) 5 MG TABS tablet Take 1 tablet (5 mg total) by mouth 2 (two) times daily. 60 tablet 1  ? ascorbic acid (VITAMIN C) 500 MG tablet Take 500 mg by mouth in the morning and at bedtime.    ? atorvastatin (LIPITOR) 40 MG tablet Take 80 mg by mouth every evening.    ? Dextran 70-Hypromellose (ARTIFICIAL TEARS) 0.1-0.3 % SOLN Place 1 drop into both eyes daily as needed (dry eyes).    ? diclofenac Sodium (VOLTAREN) 1 % GEL Apply 2 g topically 2 (two) times daily as needed (Pain).    ? docusate sodium (COLACE) 100 MG capsule Take 100 mg by mouth daily as needed for mild constipation.    ? ezetimibe  (ZETIA) 10 MG tablet Take 1 tablet (10 mg total) by mouth daily. 90 tablet 3  ? ferrous sulfate 325 (65 FE) MG tablet Take 1 tablet (325 mg total) by mouth daily. 30 tablet 1  ? finasteride (PROSCAR) 5 MG tablet Take 5 mg by mouth daily.    ? fluticasone (FLONASE) 50 MCG/ACT nasal spray Place into both nostrils as needed for allergies or rhinitis.    ? fluticasone-salmeterol (ADVAIR) 250-50 MCG/ACT AEPB Inhale 1 puff into the lungs in the morning and at bedtime.    ? gabapentin (NEURONTIN) 300 MG capsule Take 300 mg by mouth 3 (three) times daily.    ? levocetirizine (XYZAL) 5 MG tablet Take 5 mg by mouth daily as needed for allergies.    ? lidocaine (LIDODERM) 5 % Place 1 patch onto the skin every 12 (twelve) hours as needed (pain).    ? metoprolol succinate (TOPROL  XL) 25 MG 24 hr tablet Take 1 tablet (25 mg total) by mouth at bedtime. 90 tablet 3  ? montelukast (SINGULAIR) 10 MG tablet Take 10 mg by mouth at bedtime.    ? nicotine (NICODERM CQ - DOSED IN MG/24 HOURS) 21 mg/24hr patch Place 1 patch (21 mg total) onto the skin daily. 28 patch 0  ? nitroGLYCERIN (NITROSTAT) 0.4 MG SL tablet Take up to 3 tablets as needed for chest pain. 25 tablet 3  ? pantoprazole (PROTONIX) 40 MG tablet Take 1 tablet (40 mg total) by mouth 2 (two) times daily.    ? sildenafil (VIAGRA) 100 MG tablet Take 100 mg by mouth daily as needed for erectile dysfunction.    ? tamsulosin (FLOMAX) 0.4 MG CAPS capsule Take 0.4 mg by mouth at bedtime.    ? Tiotropium Bromide Monohydrate (SPIRIVA RESPIMAT) 2.5 MCG/ACT AERS Inhale 2 puffs into the lungs daily.    ? ?No facility-administered medications prior to visit.  ? ? ? ?Review of Systems:  ? ?Constitutional: No weight loss or gain, night sweats, fevers, chills. +Fatigue (chronic) ?HEENT: No headaches, difficulty swallowing, tooth/dental problems, or sore throat. No sneezing, itching, ear ache, nasal congestion, or post nasal drip ?CV:  No chest pain, orthopnea, PND, swelling in lower  extremities, anasarca, dizziness, palpitations, syncope ?Resp: +shortness of breath with exertion (at baseline); minimal cough (chronic). No excess mucus or change in color of mucus.  No hemoptysis. No wheezing.  No

## 2021-11-13 NOTE — Assessment & Plan Note (Signed)
Stable.  No increased O2 demand.  Continue 2 L supplemental O2 for goal SPO2 greater than 88 to 90%.  Order sent to [place] for [place] qualification and device. ?

## 2021-11-13 NOTE — Patient Instructions (Addendum)
Continue Advair 1 puff twice daily.  Brush tongue and rinse mouth afterwards ?Continue Spiriva 2 puffs daily ?Continue Albuterol inhaler 2 puffs or 3 mL neb every 6 hours as needed for shortness of breath or wheezing. Notify if symptoms persist despite rescue inhaler/neb use. ?Continue Singulair 10 mg nightly ?Continue Xyzal 5 mg daily as needed for allergies ?Continue Flonase 2 sprays each nostril daily as needed for allergies/nasal congestion ?Continue supplemental oxygen 2 lpm for goal >88-90%  ? ?Follow up in 3 months with Dr. Erin Fulling. If symptoms do not improve or worsen, please contact office for sooner follow up or seek emergency care. ?

## 2021-11-13 NOTE — Assessment & Plan Note (Signed)
Well-controlled on current regimen of Flonase, Xyzal and Singulair. ?

## 2021-11-13 NOTE — Assessment & Plan Note (Addendum)
Recovered well.  Suspect majority of his symptoms were related to his anemia.  Compensated on current regimen.  Continue triple therapy with Advair and Spiriva.  As needed albuterol nebs or MDI.  Continue Singulair and Xyzal for trigger prevention. ? ?Patient Instructions  ?Continue Advair 1 puff twice daily.  Brush tongue and rinse mouth afterwards ?Continue Spiriva 2 puffs daily ?Continue Albuterol inhaler 2 puffs or 3 mL neb every 6 hours as needed for shortness of breath or wheezing. Notify if symptoms persist despite rescue inhaler/neb use. ?Continue Singulair 10 mg nightly ?Continue Xyzal 5 mg daily as needed for allergies ?Continue Flonase 2 sprays each nostril daily as needed for allergies/nasal congestion ?Continue supplemental oxygen 2 lpm for goal >88-90%  ? ?Follow up in 3 months with Dr. Erin Fulling. If symptoms do not improve or worsen, please contact office for sooner follow up or seek emergency care. ? ? ?

## 2021-11-14 NOTE — Telephone Encounter (Signed)
Checked JD and TP's papers and nothing received. Called [employer] and spoke with rep. She is going to refax this. Will await fax.  ?

## 2021-11-14 NOTE — Telephone Encounter (Signed)
Jessica checking on fax sent 11/13/2021 for Albuterol. Janett Billow phone number is [phone]. ?

## 2021-11-15 ENCOUNTER — Telehealth: Payer: Self-pay | Admitting: Pulmonary Disease

## 2021-11-15 ENCOUNTER — Telehealth: Payer: Self-pay | Admitting: Internal Medicine

## 2021-11-15 NOTE — Telephone Encounter (Signed)
Form has been received and faxed back to [place]. I have also made a copy of the form and will send it to scan.  ?

## 2021-11-15 NOTE — Telephone Encounter (Signed)
Called pt advised that labs  10/31/21 were received from the [place].   LDL 44 ALT-29.  [place] lab appointment for 11/21/21.  Will send to Dr. Gasper Sells as an Juluis Rainier.  ?

## 2021-11-15 NOTE — Telephone Encounter (Signed)
Patient wanted to know if Dr. Gasper Sells had gotten a chance to go over the labs he had done at the [facility] 10/31/21. He wanted to know if he still needs to have repeat labs done Tuesday 11/21/21 ?

## 2021-11-16 DIAGNOSIS — J449 Chronic obstructive pulmonary disease, unspecified: Secondary | ICD-10-CM | POA: Diagnosis not present

## 2021-11-16 NOTE — Telephone Encounter (Signed)
Jessica from [facility] states she received a fax this morning with Gregory Christensen's name but Dr. August Albino signature and no date. Janett Billow needs Gregory's name crossed out and add Dr. Lisabeth Pick name, NPI and date. OR the fax with Dr. Lisabeth Pick name printed and signed.

## 2021-11-16 NOTE — Telephone Encounter (Signed)
Fax has been received and has been placed in cabinet with Dr. August Albino papers. Routing to [place] for her to follow up on this.

## 2021-11-16 NOTE — Telephone Encounter (Signed)
Called and spoke with Gregory Christensen. I advised her that we received the correct form yesterday from [facility] and it was faxed yesterday afternoon. She stated that she did not receive the form. I advised her that I could re-fax the form but she may need to check with Melissa to make sure they are not processing the same form at the same time. She verbalized understanding.   Nothing further needed at time of call.

## 2021-11-16 NOTE — Telephone Encounter (Signed)
See phone encounter from 11/15/21.

## 2021-11-20 ENCOUNTER — Other Ambulatory Visit: Payer: Medicare Other

## 2021-11-21 ENCOUNTER — Other Ambulatory Visit: Payer: Medicare Other

## 2021-11-21 DIAGNOSIS — K219 Gastro-esophageal reflux disease without esophagitis: Secondary | ICD-10-CM | POA: Diagnosis not present

## 2021-11-21 DIAGNOSIS — E785 Hyperlipidemia, unspecified: Secondary | ICD-10-CM | POA: Diagnosis not present

## 2021-11-21 DIAGNOSIS — I10 Essential (primary) hypertension: Secondary | ICD-10-CM | POA: Diagnosis not present

## 2021-11-21 DIAGNOSIS — N1832 Chronic kidney disease, stage 3b: Secondary | ICD-10-CM | POA: Diagnosis not present

## 2021-11-21 DIAGNOSIS — J449 Chronic obstructive pulmonary disease, unspecified: Secondary | ICD-10-CM | POA: Diagnosis not present

## 2021-12-05 ENCOUNTER — Other Ambulatory Visit: Payer: Self-pay | Admitting: Internal Medicine

## 2021-12-06 ENCOUNTER — Ambulatory Visit (INDEPENDENT_AMBULATORY_CARE_PROVIDER_SITE_OTHER): Payer: Medicare Other | Admitting: Internal Medicine

## 2021-12-06 ENCOUNTER — Encounter: Payer: Self-pay | Admitting: Internal Medicine

## 2021-12-06 VITALS — BP 112/58 | Ht 73.0 in | Wt 162.0 lb

## 2021-12-06 DIAGNOSIS — I2584 Coronary atherosclerosis due to calcified coronary lesion: Secondary | ICD-10-CM

## 2021-12-06 DIAGNOSIS — J449 Chronic obstructive pulmonary disease, unspecified: Secondary | ICD-10-CM | POA: Diagnosis not present

## 2021-12-06 DIAGNOSIS — E119 Type 2 diabetes mellitus without complications: Secondary | ICD-10-CM

## 2021-12-06 DIAGNOSIS — I10 Essential (primary) hypertension: Secondary | ICD-10-CM

## 2021-12-06 DIAGNOSIS — R079 Chest pain, unspecified: Secondary | ICD-10-CM

## 2021-12-06 DIAGNOSIS — N1832 Chronic kidney disease, stage 3b: Secondary | ICD-10-CM | POA: Diagnosis not present

## 2021-12-06 DIAGNOSIS — I2510 Atherosclerotic heart disease of native coronary artery without angina pectoris: Secondary | ICD-10-CM | POA: Diagnosis not present

## 2021-12-06 NOTE — Patient Instructions (Signed)
Medication Instructions: Your physician recommends that you continue on your current medications as directed. Please refer to the Current Medication list given to you today.  *If you need a refill on your cardiac medications before your next appointment, please call your pharmacy*   Lab Work: none If you have labs (blood work) drawn today and your tests are completely normal, you will receive your results only by: Gregory Christensen (if you have MyChart) OR A paper copy in the mail If you have any lab test that is abnormal or we need to change your treatment, we will call you to review the results.   Testing/Procedures: How to Prepare for Your Cardiac PET/CT Stress Test:  1. Please do not take these medications before your test:   Medications that may interfere with the cardiac pharmacological stress agent (ex. nitrates - including erectile dysfunction medications or beta-blockers) the day of the exam. (Erectile dysfunction medication should be held for at least 72 hrs prior to test) Theophylline containing medications for 12 hours. Dipyridamole 48 hours prior to the test. Your remaining medications may be taken with water.        Hold Metoprolol morning of your Pet Scan.   2. Nothing to eat or drink, except water, 3 hours prior to arrival time.   NO caffeine/decaffeinated products, or chocolate 12 hours prior to arrival.  3. NO perfume, cologne or lotion  4. Total time is 1 to 2 hours; you may want to bring reading material for the waiting time.  5. Please report to Admitting at the [place] Entrance 60 minutes early for your test.  [name], [name] [id number]   In preparation for your appointment, medication and supplies will be purchased.  Appointment availability is limited, so if you need to cancel or reschedule, please call the Radiology Department at [phone]  24 hours in advance to avoid a cancellation fee of $100.00  What to Expect  After you Arrive:  Once you arrive and check in for your appointment, you will be taken to a preparation room within the Radiology Department.  A technologist or Nurse will obtain your medical history, verify that you are correctly prepped for the exam, and explain the procedure.  Afterwards,  an IV will be started in your arm and electrodes will be placed on your skin for EKG monitoring during the stress portion of the exam. Then you will be escorted to the PET/CT scanner.  There, staff will get you positioned on the scanner and obtain a blood pressure and EKG.  During the exam, you will continue to be connected to the EKG and blood pressure machines.  A small, safe amount of a radioactive tracer will be injected in your IV to obtain a series of pictures of your heart along with an injection of a stress agent.    After your Exam:  It is recommended that you eat a meal and drink a caffeinated beverage to counter act any effects of the stress agent.  Drink plenty of fluids for the remainder of the day and urinate frequently for the first couple of hours after the exam.  Your doctor will inform you of your test results within 7-10 business days.  For questions about your test or how to prepare for your test, please call: Marchia Bond, Cardiac Imaging Nurse Navigator  Gordy Clement, Cardiac Imaging Nurse Navigator Office: [phone]    Follow-Up: At [facility], you and your health needs are our priority.  As part of our continuing mission to provide you with exceptional heart care, we have created designated Provider Care Teams.  These Care Teams include your primary Cardiologist (physician) and Advanced Practice Providers (APPs -  Physician Assistants and Nurse Practitioners) who all work together to provide you with the care you need, when you need it.  We recommend signing up for the patient portal called "MyChart".  Sign up information is provided on this After Visit Summary.  MyChart is  used to connect with patients for Virtual Visits (Telemedicine).  Patients are able to view lab/test results, encounter notes, upcoming appointments, etc.  Non-urgent messages can be sent to your provider as well.   To learn more about what you can do with MyChart, go to [email or web address].    Your next appointment:   6 month(s)  The format for your next appointment:   In Person  Provider:   Werner Lean, MD     Other Instructions   Important Information About Sugar

## 2021-12-06 NOTE — Progress Notes (Signed)
Cardiology Office Note:    Date:  12/06/2021   ID:  Gregory Christensen, DOB [birth year], MRN [id number]  PCP:  Vernie Shanks, MD   [facility] Providers Cardiologist:  Werner Lean, MD     Referring MD: Vernie Shanks, MD   CC: Follow up chest tightness.  History of Present Illness:    Gregory Christensen is a 71 y.o. male with a hx of CAD, HTN with DM, COPD with active tobacco abuse on Home O2, CPE and DVT on IDA and hx of GI bleed who presents as a new patient 05/12/21.  2022: Echo low normal RV otherwise WNL had melena 2023: Had GI bleed again, had CP again, AKI on CKD.  Started elqiuis.  LDL was at goal at [facility]  he is back to being smoke free.   Patient notes that he is doing fine.   He presently is smoke free. He is sedentary and mostly sits around and walks to the mail box. His most activity daily is going to the mailbox; has no symptoms. There are no interval hospital/ED visit.    Rare chest tightness, used nitrogylcerin last Monday.  No SOB/DOE when going to the mailbox and no PND/Orthopnea.  No weight gain or leg swelling.  No palpitations or syncope .  No further bleeding (he is back on DOAC)   Past Medical History:  Diagnosis Date   Acute deep vein thrombosis (DVT) of left lower extremity (HCC)    Acute hypoxemic respiratory failure (HCC)    Anemia    Iron deficiency   Anxiety    Aortic atherosclerosis (HCC)    Arthritis    Benign prostatic hyperplasia 08/28/2018   UNSPECIFIED WHETHER LOWER URINARY TRACT SYMPTOMS PRESENT   CAD (coronary artery disease)    Cancer (HCC)    Chronic back pain    Chronic hip pain    CKD stage 3a 03/19/2021   Constipation    due to Iron M-W-F   COPD exacerbation ([place]) 10/18/2017   Dyspnea    Emphysema lung ([place])    Essential hypertension    GERD (gastroesophageal reflux disease)    Gout    Gross hematuria 09/08/2018   Head injury, acute, with loss of consciousness ([place])    unsure how long he was unresponsive   History of  blood transfusion 05/2019   History of kidney stones    History of tobacco abuse    Hypoxia    Insomnia    Oxygen dependent    2l- 24/7   Pneumonia    in 1980's   Pre-diabetes    Pulmonary embolism ([place]) 05/31/2018   Renal mass    CONCERNING FOR RENAL CELL CARCINOMA DR. Lovena Neighbours   Respiratory distress    Respiratory failure, acute-on-chronic ([place]) 08/03/2018   Seasonal allergies    Sleep apnea     Past Surgical History:  Procedure Laterality Date   BIOPSY  06/19/2021   Procedure: BIOPSY;  Surgeon: Wilford Corner, MD;  Location: [place];  Service: Endoscopy;;   COLONOSCOPY WITH PROPOFOL N/A 09/27/2019   Procedure: COLONOSCOPY WITH PROPOFOL;  Surgeon: Irene Shipper, MD;  Location: [facility];  Service: Endoscopy;  Laterality: N/A;   CYSTOSCOPY/URETEROSCOPY/HOLMIUM LASER/STENT PLACEMENT Left 10/31/2018   Procedure: CYSTOSCOPY/URETEROSCOPY;  Surgeon: Ceasar Mons, MD;  Location: [facility];  Service: Urology;  Laterality: Left;   ESOPHAGOGASTRODUODENOSCOPY (EGD) WITH PROPOFOL N/A 09/27/2019   Procedure: ESOPHAGOGASTRODUODENOSCOPY (EGD) WITH PROPOFOL;  Surgeon: Irene Shipper, MD;  [facility];  Service:  Endoscopy;  Laterality: N/A;   ESOPHAGOGASTRODUODENOSCOPY (EGD) WITH PROPOFOL N/A 06/19/2021   Procedure: ESOPHAGOGASTRODUODENOSCOPY (EGD) WITH PROPOFOL;  Surgeon: Wilford Corner, MD;  Location: [place];  Service: Endoscopy;  Laterality: N/A;   GIVENS CAPSULE STUDY N/A 06/20/2021   Procedure: GIVENS CAPSULE STUDY;  Surgeon: Wilford Corner, MD;  Location: [place];  Service: Endoscopy;  Laterality: N/A;   HERNIA REPAIR Left 1977   inguinal   HYDROCELE EXCISION Left 05/13/2019   Procedure: HYDROCELECTOMY ADULT;  Surgeon: Ceasar Mons, MD;  [facility];  Service: Urology;  Laterality: Left;   Black   EXTENSIVE   POLYPECTOMY  09/27/2019   Procedure: POLYPECTOMY;  Surgeon: Irene Shipper, MD;  [facility];   Service: Endoscopy;;   ROBOT ASSITED LAPAROSCOPIC NEPHROURETERECTOMY Left 10/31/2018   Procedure: XI ROBOT ASSITED LAPAROSCOPIC NEPHROURETERECTOMY;  Surgeon: Ceasar Mons, MD;  [facility];  Service: Urology;  Laterality: Left;  ONLY NEEDS 240 MIN FOR ALL PROCEDURES   SHOULDER ARTHROSCOPY WITH ROTATOR CUFF REPAIR Left 04/12/2020   Procedure: LEFT SHOULDER ARTHROSCOPY, DEBRIDEMENT, BICEPS TENODESIS, MINI OPEN ROTATOR CUFF TEAR REPAIR;  Surgeon: Meredith Pel, MD;  Location: [place];  Service: Orthopedics;  Laterality: Left;    Current Medications: Current Meds  Medication Sig   albuterol (PROVENTIL) (2.5 MG/3ML) 0.083% nebulizer solution Take 3 mLs (2.5 mg total) by nebulization every 6 (six) hours as needed for wheezing or shortness of breath.   albuterol (VENTOLIN HFA) 108 (90 Base) MCG/ACT inhaler Inhale 2 puffs into the lungs every 4 (four) hours as needed for wheezing or shortness of breath.   apixaban (ELIQUIS) 5 MG TABS tablet Take 1 tablet (5 mg total) by mouth 2 (two) times daily.   ascorbic acid (VITAMIN C) 500 MG tablet Take 500 mg by mouth in the morning and at bedtime.   atorvastatin (LIPITOR) 80 MG tablet Take 80 mg by mouth daily.   buPROPion (WELLBUTRIN SR) 150 MG 12 hr tablet 2 (two) times daily.   Dextran 70-Hypromellose (ARTIFICIAL TEARS) 0.1-0.3 % SOLN Place 1 drop into both eyes daily as needed (dry eyes).   diclofenac Sodium (VOLTAREN) 1 % GEL Apply 2 g topically 2 (two) times daily as needed (Pain).   docusate sodium (COLACE) 100 MG capsule Take 100 mg by mouth daily as needed for mild constipation.   ezetimibe (ZETIA) 10 MG tablet Take 1 tablet (10 mg total) by mouth daily.   ferrous sulfate 325 (65 FE) MG tablet Take 1 tablet (325 mg total) by mouth daily.   finasteride (PROSCAR) 5 MG tablet Take 5 mg by mouth daily.   fluticasone (FLONASE) 50 MCG/ACT nasal spray Place into both nostrils as needed for allergies or rhinitis.   fluticasone-salmeterol  (WIXELA INHUB) 250-50 MCG/ACT AEPB Inhale 1 puff into the lungs in the morning and at bedtime.   gabapentin (NEURONTIN) 300 MG capsule Take 300 mg by mouth 3 (three) times daily.   levocetirizine (XYZAL) 5 MG tablet Take 5 mg by mouth daily as needed for allergies.   lidocaine (LIDODERM) 5 % Place 1 patch onto the skin every 12 (twelve) hours as needed (pain).   metoprolol succinate (TOPROL XL) 25 MG 24 hr tablet Take 1 tablet (25 mg total) by mouth at bedtime.   montelukast (SINGULAIR) 10 MG tablet Take 10 mg by mouth at bedtime.   nicotine (NICODERM CQ - DOSED IN MG/24 HOURS) 21 mg/24hr patch Place 1 patch (21 mg total) onto the skin daily.  nitroGLYCERIN (NITROSTAT) 0.4 MG SL tablet DISSOLVE ONE TABLET UNDER THE TONGUE EVERY 5 MINUTES AS NEEDED FOR CHEST PAIN.  DO NOT EXCEED A TOTAL OF 3 DOSES IN 15 MINUTES NOW   pantoprazole (PROTONIX) 40 MG tablet Take 1 tablet (40 mg total) by mouth 2 (two) times daily.   sildenafil (VIAGRA) 100 MG tablet Take 100 mg by mouth daily as needed for erectile dysfunction.   tamsulosin (FLOMAX) 0.4 MG CAPS capsule Take 0.4 mg by mouth at bedtime.   Tiotropium Bromide Monohydrate (SPIRIVA RESPIMAT) 2.5 MCG/ACT AERS Inhale 2 puffs into the lungs daily.   [DISCONTINUED] atorvastatin (LIPITOR) 40 MG tablet Take 80 mg by mouth every evening.   [DISCONTINUED] fluticasone-salmeterol (ADVAIR) 250-50 MCG/ACT AEPB Inhale 1 puff into the lungs in the morning and at bedtime.     Allergies:   Patient has no known allergies.   Social History   Socioeconomic History   Marital status: Divorced    Spouse name: Not on file   Number of children: Not on file   Years of education: Not on file   Highest education level: Not on file  Occupational History   Not on file  Tobacco Use   Smoking status: Former    Packs/day: 0.50    Years: 57.00    Pack years: 28.50    Types: Cigarettes    Quit date: 05/02/2021    Years since quitting: 0.5   Smokeless tobacco: Never  Vaping  Use   Vaping Use: Never used  Substance and Sexual Activity   Alcohol use: Not Currently    Alcohol/week: 10.0 standard drinks    Types: 10 Glasses of wine per week    Comment: 0-2 beers a day    Drug use: Not Currently    Types: Flunitrazepam   Sexual activity: Not on file  Other Topics Concern   Not on file  Social History Narrative   Not on file   Social Determinants of Health   Financial Resource Strain: Not on file  Food Insecurity: Not on file  Transportation Needs: Not on file  Physical Activity: Not on file  Stress: Not on file  Social Connections: Not on file    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  ROS:   Please see the history of present illness.     All other systems reviewed and are negative.  EKGs/Labs/Other Studies Reviewed:    The following studies were reviewed today:  EKG:   05/12/21: SR rate 82 Low voltage with baseline artifact  Transthoracic Echocardiogram: Date: 03/20/21 Results:  1. Left ventricular ejection fraction, by estimation, is 60 to 65%. The  left ventricle has normal function. The left ventricle has no regional  wall motion abnormalities. Left ventricular diastolic parameters were  normal.   2. Right ventricular systolic function is low normal. The right  ventricular size is mildly enlarged.   3. The mitral valve is normal in structure. Trivial mitral valve  regurgitation.   4. The aortic valve is normal in structure. Aortic valve regurgitation is  not visualized.   NonCardiac CT: Date: 08/02/18 Results: Mild CAC and Aortic Athero IMPRESSION: 1. Resolution of previously noted right upper lobe lobar pulmonary embolus. No large central pulmonary embolus is identified. 2. Panlobular emphysema with new areas of subsegmental atelectasis and/or scarring in the right upper lobe and right middle lobe. 3.  Respiratory  motion and left arm streak artifact simulating pulmonary emboli to the right lower lobe are identified. No acute pulmonary embolus is currently noted.   Aortic Atherosclerosis (ICD10-I70.0) and Emphysema (ICD10-J43.9).  ECG or NM Stress Testing : Date: 10/17/18 Results: Nuclear stress EF: 71%. Normal perfusion with minimal soft tissue attenuation. No ischemia or scar This is a low risk study.     Recent Labs: 10/18/2021: B Natriuretic Peptide 9.9 10/20/2021: ALT 14 10/23/2021: BUN 32; Creatinine, Ser 1.78; Hemoglobin 9.9; Magnesium 2.6; Platelets 208; Potassium 4.4; Sodium 135  Recent Lipid Panel    Component Value Date/Time   CHOL 141 08/15/2021 1007   TRIG 148 08/15/2021 1007   HDL 52 08/15/2021 1007   CHOLHDL 2.7 08/15/2021 1007   LDLCALC 64 08/15/2021 1007        Physical Exam:    VS:  BP (!) 112/58   Ht '6\' 1"'$  (1.854 m)   Wt 162 lb (73.5 kg)   BMI 21.37 kg/m     Wt Readings from Last 3 Encounters:  12/06/21 162 lb (73.5 kg)  11/13/21 162 lb (73.5 kg)  10/31/21 160 lb 6.4 oz (72.8 kg)    Gen: No distress Neck: No JVD Ears: Bilateral Frank Sign Cardiac: No Rubs or Gallops, no murmur, normal rate , +2 radial pulses; No RV heave Respiratory: No wheezes, normal  respiratory rate GI: Soft, nontender, non-distended  MS: No  edema;  moves all extremities Integument: Skin feels warm Neuro:  At time of evaluation, alert and oriented to person/place/time/situation  Psych: Normal affect, patient feels well   ASSESSMENT:    1. Chest pain, unspecified type   2. Coronary artery calcification   3. Diabetes mellitus with coincident hypertension ([place])   4. Chronic obstructive pulmonary disease, unspecified COPD type ([place])   5. Stage 3b chronic kidney disease ([place])      PLAN:    Chest Pain Coronary artery calcifications; aortic atherosclerosis HTN with DM Former Tobacco Abuse COPD  on home O2  HX of PE and DVT on Eliquis IDA and hx of GI bleed small  bowel AVMs with bleed in 2022 and 2023 CKD Stage IIIb - Will do PET- MPO; if positive, will bring back sooner for cath discussion - continue succinate 25 for now; hold for PET scan - continue nicotine patch - Hgb goal of 8 - patient is on eliquis for non-cardiac indications (DVT and PE); high risk either way, defer to PCP - continue atorvastatin 80 mg PO daily - has PRN nitrogylcerin, if positive PET will add IMDUR 15 mg PO daily and stop Viagra (none used in 2023) - limited BP room for aggressive medical mgmt   Fall/Winter f/u   Medication Adjustments/Labs and Tests Ordered: Current medicines are reviewed at length with the patient today.  Concerns regarding medicines are outlined above.  Orders Placed This Encounter  Procedures   NM PET CT CARDIAC PERFUSION MULTI W/ABSOLUTE BLOODFLOW   Cardiac Stress Test: Informed Consent Details: Physician/Practitioner Attestation; Transcribe to consent form and obtain patient signature    No orders of the defined types were placed in this encounter.    Patient Instructions  Medication Instructions: Your physician recommends that you continue on your current medications as directed. Please refer to the Current Medication list given to you today.  *If you need a refill on your cardiac medications before your next appointment, please call your pharmacy*   Lab Work: none If you have labs (blood work) drawn today and your tests are completely  normal, you will receive your results only by: MyChart Message (if you have MyChart) OR A paper copy in the mail If you have any lab test that is abnormal or we need to change your treatment, we will call you to review the results.   Testing/Procedures: How to Prepare for Your Cardiac PET/CT Stress Test:  1. Please do not take these medications before your test:   Medications that may interfere with the cardiac pharmacological stress agent (ex. nitrates - including erectile dysfunction medications or  beta-blockers) the day of the exam. (Erectile dysfunction medication should be held for at least 72 hrs prior to test) Theophylline containing medications for 12 hours. Dipyridamole 48 hours prior to the test. Your remaining medications may be taken with water.        Hold Metoprolol morning of your Pet Scan.   2. Nothing to eat or drink, except water, 3 hours prior to arrival time.   NO caffeine/decaffeinated products, or chocolate 12 hours prior to arrival.  3. NO perfume, cologne or lotion  4. Total time is 1 to 2 hours; you may want to bring reading material for the waiting time.  5. Please report to Admitting at the [place] Entrance 60 minutes early for your test.  [place], [place] [phone]   In preparation for your appointment, medication and supplies will be purchased.  Appointment availability is limited, so if you need to cancel or reschedule, please call the Radiology Department at [phone]  24 hours in advance to avoid a cancellation fee of $100.00  What to Expect After you Arrive:  Once you arrive and check in for your appointment, you will be taken to a preparation room within the Radiology Department.  A technologist or Nurse will obtain your medical history, verify that you are correctly prepped for the exam, and explain the procedure.  Afterwards,  an IV will be started in your arm and electrodes will be placed on your skin for EKG monitoring during the stress portion of the exam. Then you will be escorted to the PET/CT scanner.  There, staff will get you positioned on the scanner and obtain a blood pressure and EKG.  During the exam, you will continue to be connected to the EKG and blood pressure machines.  A small, safe amount of a radioactive tracer will be injected in your IV to obtain a series of pictures of your heart along with an injection of a stress agent.    After your Exam:  It is recommended that you eat a meal and drink  a caffeinated beverage to counter act any effects of the stress agent.  Drink plenty of fluids for the remainder of the day and urinate frequently for the first couple of hours after the exam.  Your doctor will inform you of your test results within 7-10 business days.  For questions about your test or how to prepare for your test, please call: Marchia Bond, Cardiac Imaging Nurse Navigator  Gordy Clement, Cardiac Imaging Nurse Navigator Office: [phone]    Follow-Up: At [facility], you and your health needs are our priority.  As part of our continuing mission to provide you with exceptional heart care, we have created designated Provider Care Teams.  These Care Teams include your primary Cardiologist (physician) and Advanced Practice Providers (APPs -  Physician Assistants and Nurse Practitioners) who all work together to provide you with the care you need, when you need it.  We  recommend signing up for the patient portal called "MyChart".  Sign up information is provided on this After Visit Summary.  MyChart is used to connect with patients for Virtual Visits (Telemedicine).  Patients are able to view lab/test results, encounter notes, upcoming appointments, etc.  Non-urgent messages can be sent to your provider as well.   To learn more about what you can do with MyChart, go to [email or web address].    Your next appointment:   6 month(s)  The format for your next appointment:   In Person  Provider:   Werner Lean, MD     Other Instructions   Important Information About Sugar         Signed, Werner Lean, MD  12/06/2021 10:21 AM    [place]

## 2021-12-17 DIAGNOSIS — J449 Chronic obstructive pulmonary disease, unspecified: Secondary | ICD-10-CM | POA: Diagnosis not present

## 2022-01-16 DIAGNOSIS — J449 Chronic obstructive pulmonary disease, unspecified: Secondary | ICD-10-CM | POA: Diagnosis not present

## 2022-01-17 ENCOUNTER — Other Ambulatory Visit: Payer: Self-pay | Admitting: Gastroenterology

## 2022-01-17 DIAGNOSIS — K922 Gastrointestinal hemorrhage, unspecified: Secondary | ICD-10-CM | POA: Diagnosis not present

## 2022-01-19 ENCOUNTER — Telehealth: Payer: Self-pay | Admitting: *Deleted

## 2022-01-19 NOTE — Telephone Encounter (Signed)
   Pre-operative Risk Assessment    Patient Name: Gregory Christensen  DOB: [birth year] MRN: [id number]      Request for Surgical Clearance    Procedure:   colonoscopy/endoscopy  Date of Surgery:  Clearance 04/18/22                                 Surgeon:  Dr. Michail Sermon Surgeon's Group or Practice Name:  [facility] Phone number:  [phone] Fax number:  [phone]   Type of Clearance Requested:   - Pharmacy:  Hold Apixaban (Eliquis) not indicated   Type of Anesthesia:   Propafol   Additional requests/questions:    Signed, Greer Ee   01/19/2022, 1:00 PM

## 2022-01-22 NOTE — Telephone Encounter (Signed)
   Patient Name: Gregory Christensen  DOB: 03-02-1951 MRN: [id number]  Primary Cardiologist: Werner Lean, MD  Chart reviewed as part of pre-operative protocol coverage.   Received pharmacy clearance request to hold Eliquis prior to colonoscopy/endoscopy scheduled for 04/18/2022. Patient takes Eliquis for history of PE/DVT, not managed by cardiology. Therefore, recommendations for holding Eliquis prior to procedure should come from managing/prescribing provider.  I will route this recommendation to the requesting party and remove this request from pre-op pool.  Please call with any questions. Thank you.   Lenna Sciara, NP 01/22/2022, 11:38 AM

## 2022-02-27 ENCOUNTER — Ambulatory Visit (INDEPENDENT_AMBULATORY_CARE_PROVIDER_SITE_OTHER): Payer: No Typology Code available for payment source | Admitting: Pulmonary Disease

## 2022-02-27 ENCOUNTER — Encounter: Payer: Self-pay | Admitting: Pulmonary Disease

## 2022-02-27 VITALS — BP 122/66 | HR 110 | Temp 97.7°F | Ht 73.0 in | Wt 164.6 lb

## 2022-02-27 DIAGNOSIS — J9611 Chronic respiratory failure with hypoxia: Secondary | ICD-10-CM

## 2022-02-27 DIAGNOSIS — J441 Chronic obstructive pulmonary disease with (acute) exacerbation: Secondary | ICD-10-CM | POA: Diagnosis not present

## 2022-02-27 MED ORDER — PREDNISONE 10 MG PO TABS: ORAL_TABLET | ORAL | 0 refills | Status: AC

## 2022-02-27 MED ORDER — AZITHROMYCIN 250 MG PO TABS: ORAL_TABLET | ORAL | 0 refills | Status: DC

## 2022-02-27 NOTE — Patient Instructions (Addendum)
Given your increased cough and chest tightness with wheezing on exam we will treat you for COPD exacerbation  Start prednisone taper and Zpak antibiotic  Continue wixella 1 puff twice daily  Continue Spiriva 2 puffs daily  Continue to use albuterol every 4 hours as needed  Follow up in 6 months

## 2022-02-27 NOTE — Progress Notes (Signed)
Synopsis: Referred in October 2022 for COPD by Gregory Heft, MD  Subjective:   PATIENT ID: Gregory Christensen GENDER: male DOB: [birth year], MRN: [id number]  HPI  Chief Complaint  Patient presents with   Follow-up    Breathing is at his baseline today. He has had cough with thick, yellow sputum x 2 months. He states he notices wheezing at night. He is using his albuterol inhaler 2 x daily on average and neb at least once per day.    Gregory Christensen is a 71 year old male, former smoker with chronic respiratory failure, COPD, DVT/PE, sleep apnea and chronic kidney disease who returns to pulmonary clinic for COPD.  He was seen 11/13/21 by Roxan Diesel, NP for hospital follow up. He has been treated for 2-3 COPD exacerbations since last fall. He remains on Wixella 250-76mg 1 puff twice daily and spiriva respimat 2.569m daily.   He has increased cough and chest tightness today. He is coughing up thick yellow phlegm. Otherwise he has been doing ok since May.  Initial OV 04/13/21 [facility]  He is currently using Advair and Spiriva along with albuterol 1-2 times per day.  He has complaints of dyspnea and wheezing.  He does have a nebulizer machine at home which he uses about 2 times per week.  He does complain of waking up at night with shortness of breath.  He denies any snoring.  He quit smoking in November 2021 and has been smoking since he was [age] old.  At one point he was smoking 4 packs/day.  He has over a 20-pack-year smoking history.  Pulmonary function test from 2016 showed an FEV1 of 1.06 L (33%), ratio 51, DLCO 51%.  Alpha 1 antitrypsin level in 2019 was normal.  He was referred to pulmonary rehab in 08/2019 but did not attend.  He is on Eliquis for history of DVT/PE.  Past Medical History:  Diagnosis Date   Acute deep vein thrombosis (DVT) of left lower extremity (HCC)    Acute hypoxemic respiratory failure (HCC)     Anemia    Iron deficiency   Anxiety    Aortic atherosclerosis (HCC)    Arthritis    Benign prostatic hyperplasia 08/28/2018   UNSPECIFIED WHETHER LOWER URINARY TRACT SYMPTOMS PRESENT   CAD (coronary artery disease)    Cancer (HCC)    Chronic back pain    Chronic hip pain    CKD stage 3a 03/19/2021   Constipation    due to Iron M-W-F   COPD exacerbation ([facility]/19/2019   Dyspnea    Emphysema lung ([facility]   Essential hypertension    GERD (gastroesophageal reflux disease)    Gout    Gross hematuria 09/08/2018   Head injury, acute, with loss of consciousness ([place]   unsure how long he was unresponsive   History of blood transfusion 05/2019   History of kidney stones    History of tobacco abuse    Hypoxia    Insomnia    Oxygen dependent    2l- 24/7   Pneumonia    in 1980's   Pre-diabetes    Pulmonary embolism ([facility]11/30/2019   Renal mass    CONCERNING FOR RENAL CELL CARCINOMA DR. WILovena Neighbours Respiratory distress    Respiratory failure, acute-on-chronic (HCHall[place]2/08/2018   Seasonal allergies    Sleep apnea      Family History  Problem Relation Age of Onset   Diabetes Mother    Hypertension Sister    Hypertension Brother      Social History   Socioeconomic History   Marital status: Divorced    Spouse name: Not on file   Number of children: Not on file   Years of education: Not on file   Highest education level: Not on file  Occupational History   Not on file  Tobacco Use   Smoking status: Some Days    Packs/day: 0.50    Years: 57.00    Total pack years: 28.50    Types: Cigarettes   Smokeless tobacco: Never  Vaping Use   Vaping Use: Never used  Substance and Sexual Activity   Alcohol use: Not Currently    Alcohol/week: 10.0 standard drinks of alcohol    Types: 10 Glasses of wine per week    Comment: 0-2 beers a day    Drug use: Not Currently    Types: Flunitrazepam   Sexual activity: Not on file  Other Topics Concern   Not on file  Social History  Narrative   Not on file   Social Determinants of Health   Financial Resource Strain: Not on file  Food Insecurity: Not on file  Transportation Needs: Not on file  Physical Activity: Not on file  Stress: Not on file  Social Connections: Not on file  Intimate Partner Violence: Not on file     No Known Allergies   Outpatient Medications Prior to Visit  Medication Sig Dispense Refill   albuterol (PROVENTIL) (2.5 MG/3ML) 0.083% nebulizer solution Take 3 mLs (2.5 mg total) by nebulization every 6 (six) hours as needed for wheezing or shortness of breath. 180 mL 4   albuterol (VENTOLIN HFA) 108 (90 Base) MCG/ACT inhaler Inhale 2 puffs into the lungs every 4 (four) hours as needed for wheezing or shortness of breath. 1 each 0   apixaban (ELIQUIS) 5 MG TABS tablet Take 1 tablet (5 mg total) by mouth 2 (two) times daily. 60 tablet 1   ascorbic acid (VITAMIN C) 500 MG tablet Take 500 mg by mouth in the morning and at bedtime.     atorvastatin (LIPITOR) 80 MG tablet Take 80 mg by mouth daily.     buPROPion (WELLBUTRIN SR) 150 MG 12 hr tablet 2 (two) times daily.     Dextran 70-Hypromellose (ARTIFICIAL TEARS) 0.1-0.3 % SOLN Place 1 drop into both eyes daily as needed (dry eyes).     diclofenac Sodium (VOLTAREN) 1 % GEL Apply 2 g topically 2 (two) times daily as needed (Pain).     docusate sodium (COLACE) 100 MG capsule Take 100 mg by mouth daily as needed for mild constipation.     ezetimibe (ZETIA) 10 MG tablet Take 1 tablet (10 mg total) by mouth daily. 90 tablet 3   ferrous sulfate 325 (65 FE) MG tablet Take 1 tablet (325 mg total) by mouth daily. 30 tablet 1   finasteride (PROSCAR) 5 MG tablet Take 5 mg by mouth daily.     fluticasone (FLONASE) 50 MCG/ACT nasal spray Place into both nostrils as needed for allergies or rhinitis.     fluticasone-salmeterol (WIXELA INHUB) 250-50 MCG/ACT AEPB Inhale 1 puff into the lungs in the morning and at bedtime.     gabapentin (NEURONTIN) 300 MG capsule Take  300 mg by mouth 3 (three) times daily.     levocetirizine (XYZAL) 5 MG tablet Take 5 mg by mouth daily as needed for  allergies.     lidocaine (LIDODERM) 5 % Place 1 patch onto the skin every 12 (twelve) hours as needed (pain).     metoprolol succinate (TOPROL XL) 25 MG 24 hr tablet Take 1 tablet (25 mg total) by mouth at bedtime. 90 tablet 3   montelukast (SINGULAIR) 10 MG tablet Take 10 mg by mouth at bedtime.     nicotine (NICODERM CQ - DOSED IN MG/24 HOURS) 21 mg/24hr patch Place 1 patch (21 mg total) onto the skin daily. 28 patch 0   nitroGLYCERIN (NITROSTAT) 0.4 MG SL tablet DISSOLVE ONE TABLET UNDER THE TONGUE EVERY 5 MINUTES AS NEEDED FOR CHEST PAIN.  DO NOT EXCEED A TOTAL OF 3 DOSES IN 15 MINUTES NOW 25 tablet 11   pantoprazole (PROTONIX) 40 MG tablet Take 1 tablet (40 mg total) by mouth 2 (two) times daily.     sildenafil (VIAGRA) 100 MG tablet Take 100 mg by mouth daily as needed for erectile dysfunction.     tamsulosin (FLOMAX) 0.4 MG CAPS capsule Take 0.4 mg by mouth at bedtime.     Tiotropium Bromide Monohydrate (SPIRIVA RESPIMAT) 2.5 MCG/ACT AERS Inhale 2 puffs into the lungs daily.     No facility-administered medications prior to visit.    Review of Systems  Constitutional:  Negative for chills, fever, malaise/fatigue and weight loss.  HENT:  Negative for congestion, sinus pain and sore throat.   Eyes: Negative.   Respiratory:  Positive for cough, sputum production, shortness of breath and wheezing. Negative for hemoptysis.   Cardiovascular:  Negative for chest pain, palpitations, orthopnea, claudication and leg swelling.  Gastrointestinal:  Negative for abdominal pain, heartburn, nausea and vomiting.  Genitourinary: Negative.   Musculoskeletal:  Negative for joint pain and myalgias.  Skin:  Negative for rash.  Neurological:  Negative for weakness.  Endo/Heme/Allergies: Negative.   Psychiatric/Behavioral: Negative.      Objective:   Vitals:   02/27/22 0945  BP:  122/66  Pulse: (!) 110  Temp: 97.7 F (36.5 C)  TempSrc: Oral  SpO2: 99%  Weight: 164 lb 9.6 oz (74.7 kg)  Height: '6\' 1"'$  (1.854 m)    Physical Exam Constitutional:      General: He is not in acute distress. HENT:     Head: Normocephalic and atraumatic.  Cardiovascular:     Rate and Rhythm: Normal rate and regular rhythm.     Pulses: Normal pulses.     Heart sounds: Normal heart sounds. No murmur heard. Pulmonary:     Breath sounds: Decreased air movement present. Wheezing present. No rhonchi or rales.  Musculoskeletal:     Right lower leg: No edema.     Left lower leg: No edema.  Skin:    General: Skin is warm and dry.  Neurological:     General: No focal deficit present.     Mental Status: He is alert.  Psychiatric:        Mood and Affect: Mood normal.        Behavior: Behavior normal.        Thought Content: Thought content normal.        Judgment: Judgment normal.     CBC    Component Value Date/Time   WBC 12.7 (H) 10/23/2021 0433   RBC 3.92 (L) 10/23/2021 0433   HGB 9.9 (L) 10/23/2021 0433   HGB 11.3 (L) 08/15/2021 1007   HCT 32.8 (L) 10/23/2021 0433   HCT 36.1 (L) 08/15/2021 1007   PLT 208 10/23/2021 0433  PLT 345 08/15/2021 1007   MCV 83.7 10/23/2021 0433   MCV 86 08/15/2021 1007   MCH 25.3 (L) 10/23/2021 0433   MCHC 30.2 10/23/2021 0433   RDW 17.0 (H) 10/23/2021 0433   RDW 15.2 08/15/2021 1007   LYMPHSABS 1.0 10/23/2021 0433   MONOABS 1.1 (H) 10/23/2021 0433   EOSABS 0.0 10/23/2021 0433   BASOSABS 0.0 10/23/2021 0433      Latest Ref Rng & Units 10/23/2021    4:33 AM 10/22/2021    5:08 AM 10/21/2021    6:28 AM  BMP  Glucose 70 - 99 mg/dL 158  151  167   BUN 8 - 23 mg/dL 32  26  22   Creatinine 0.61 - 1.24 mg/dL 1.78  1.80  1.74   Sodium 135 - 145 mmol/L 135  136  135   Potassium 3.5 - 5.1 mmol/L 4.4  4.5  5.6   Chloride 98 - 111 mmol/L 104  104  104   CO2 22 - 32 mmol/L '24  24  23   '$ Calcium 8.9 - 10.3 mg/dL 8.4  8.9  9.0    Chest  imaging: CXR 03/18/21 The heart and mediastinal contours are within normal limits. Atherosclerotic plaque. Slightly more lucent right mid lung zone likely due to patient positioning. No focal consolidation. No pulmonary edema. No pleural effusion. No pneumothorax. No acute osseous abnormality  CT Abdomen 09/25/19 Lower Chest: emphysematous changes at the lung bases bilaterally.  PFT:     No data to display          [facility] FEV1 1.06L (33%), ratio 51 and DLCO 51%  Labs: 2019: Alpha 1 Antitrypsin phenotype is MM  Echo 03/20/21: LV EF 60-65%. RV systolic function is low normal. RV size is mildly enlarged.  Assessment & Plan:   COPD with acute exacerbation ([place]) - Plan: predniSONE (DELTASONE) 10 MG tablet, azithromycin (ZITHROMAX) 250 MG tablet  Chronic respiratory failure with hypoxia ([place])  Discussion: Arlie Swilling is a 71 year old male, former smoker with chronic respiratory failure, COPD, DVT/PET, sleep apnea and chronic kidney disease who returns to pulmonary clinic for COPD.  Patient has severe obstructive lung disease due to pulmonary emphysema and moderate diffusion defect. He has chronic respiratory failure that requires 2L of supplemental O2 which he uses day/night.   He is on ICS/LABA/LAMA therapy via a combination of wixela 250-35mg 1 puff twice daily and spiriva 2.529m 2 puffs daily. He is using as needed albuterol inhaler and nebulizer solution daily. He is to continue on this regimen.   He appears to have exacerbation of his COPD today with increased chest tightness, sputum production and wheezing on exam. We will treat him with extended prednisone taper and Zpak.  Follow up in 6 months.  JoFreda JacksonMD [facility] Office: [phone] See Amion for personal pager PCCM on call pager ([phone] 7pm. Please call Elink 7p-7a. [phone]   Current Outpatient Medications:    albuterol (PROVENTIL) (2.5  MG/3ML) 0.083% nebulizer solution, Take 3 mLs (2.5 mg total) by nebulization every 6 (six) hours as needed for wheezing or shortness of breath., Disp: 180 mL, Rfl: 4   albuterol (VENTOLIN HFA) 108 (90 Base) MCG/ACT inhaler, Inhale 2 puffs into the lungs every 4 (four) hours as needed for wheezing or shortness of breath., Disp: 1 each, Rfl: 0   apixaban (ELIQUIS) 5 MG TABS tablet, Take 1 tablet (5 mg total) by mouth 2 (  two) times daily., Disp: 60 tablet, Rfl: 1   ascorbic acid (VITAMIN C) 500 MG tablet, Take 500 mg by mouth in the morning and at bedtime., Disp: , Rfl:    atorvastatin (LIPITOR) 80 MG tablet, Take 80 mg by mouth daily., Disp: , Rfl:    azithromycin (ZITHROMAX) 250 MG tablet, Take as directed, Disp: 6 tablet, Rfl: 0   buPROPion (WELLBUTRIN SR) 150 MG 12 hr tablet, 2 (two) times daily., Disp: , Rfl:    Dextran 70-Hypromellose (ARTIFICIAL TEARS) 0.1-0.3 % SOLN, Place 1 drop into both eyes daily as needed (dry eyes)., Disp: , Rfl:    diclofenac Sodium (VOLTAREN) 1 % GEL, Apply 2 g topically 2 (two) times daily as needed (Pain)., Disp: , Rfl:    docusate sodium (COLACE) 100 MG capsule, Take 100 mg by mouth daily as needed for mild constipation., Disp: , Rfl:    ezetimibe (ZETIA) 10 MG tablet, Take 1 tablet (10 mg total) by mouth daily., Disp: 90 tablet, Rfl: 3   ferrous sulfate 325 (65 FE) MG tablet, Take 1 tablet (325 mg total) by mouth daily., Disp: 30 tablet, Rfl: 1   finasteride (PROSCAR) 5 MG tablet, Take 5 mg by mouth daily., Disp: , Rfl:    fluticasone (FLONASE) 50 MCG/ACT nasal spray, Place into both nostrils as needed for allergies or rhinitis., Disp: , Rfl:    fluticasone-salmeterol (WIXELA INHUB) 250-50 MCG/ACT AEPB, Inhale 1 puff into the lungs in the morning and at bedtime., Disp: , Rfl:    gabapentin (NEURONTIN) 300 MG capsule, Take 300 mg by mouth 3 (three) times daily., Disp: , Rfl:    levocetirizine (XYZAL) 5 MG tablet, Take 5 mg by mouth daily as needed for allergies.,  Disp: , Rfl:    lidocaine (LIDODERM) 5 %, Place 1 patch onto the skin every 12 (twelve) hours as needed (pain)., Disp: , Rfl:    metoprolol succinate (TOPROL XL) 25 MG 24 hr tablet, Take 1 tablet (25 mg total) by mouth at bedtime., Disp: 90 tablet, Rfl: 3   montelukast (SINGULAIR) 10 MG tablet, Take 10 mg by mouth at bedtime., Disp: , Rfl:    nicotine (NICODERM CQ - DOSED IN MG/24 HOURS) 21 mg/24hr patch, Place 1 patch (21 mg total) onto the skin daily., Disp: 28 patch, Rfl: 0   nitroGLYCERIN (NITROSTAT) 0.4 MG SL tablet, DISSOLVE ONE TABLET UNDER THE TONGUE EVERY 5 MINUTES AS NEEDED FOR CHEST PAIN.  DO NOT EXCEED A TOTAL OF 3 DOSES IN 15 MINUTES NOW, Disp: 25 tablet, Rfl: 11   pantoprazole (PROTONIX) 40 MG tablet, Take 1 tablet (40 mg total) by mouth 2 (two) times daily., Disp: , Rfl:    predniSONE (DELTASONE) 10 MG tablet, Take 4 tablets (40 mg total) by mouth daily with breakfast for 3 days, THEN 3 tablets (30 mg total) daily with breakfast for 3 days, THEN 2 tablets (20 mg total) daily with breakfast for 3 days, THEN 1 tablet (10 mg total) daily with breakfast for 3 days., Disp: 30 tablet, Rfl: 0   sildenafil (VIAGRA) 100 MG tablet, Take 100 mg by mouth daily as needed for erectile dysfunction., Disp: , Rfl:    tamsulosin (FLOMAX) 0.4 MG CAPS capsule, Take 0.4 mg by mouth at bedtime., Disp: , Rfl:    Tiotropium Bromide Monohydrate (SPIRIVA RESPIMAT) 2.5 MCG/ACT AERS, Inhale 2 puffs into the lungs daily., Disp: , Rfl:

## 2022-03-26 ENCOUNTER — Emergency Department (HOSPITAL_COMMUNITY)
Admission: EM | Admit: 2022-03-26 | Discharge: 2022-03-26 | Disposition: A | Discharge status: Home / Self Care | Payer: No Typology Code available for payment source | Attending: Emergency Medicine | Admitting: Emergency Medicine

## 2022-03-26 ENCOUNTER — Emergency Department (HOSPITAL_COMMUNITY): Payer: No Typology Code available for payment source

## 2022-03-26 ENCOUNTER — Other Ambulatory Visit: Payer: Self-pay

## 2022-03-26 ENCOUNTER — Encounter (HOSPITAL_COMMUNITY): Payer: Self-pay

## 2022-03-26 DIAGNOSIS — J449 Chronic obstructive pulmonary disease, unspecified: Secondary | ICD-10-CM | POA: Diagnosis not present

## 2022-03-26 DIAGNOSIS — S22088A Other fracture of T11-T12 vertebra, initial encounter for closed fracture: Secondary | ICD-10-CM

## 2022-03-26 DIAGNOSIS — F17210 Nicotine dependence, cigarettes, uncomplicated: Secondary | ICD-10-CM | POA: Insufficient documentation

## 2022-03-26 DIAGNOSIS — R062 Wheezing: Secondary | ICD-10-CM | POA: Insufficient documentation

## 2022-03-26 DIAGNOSIS — Z7951 Long term (current) use of inhaled steroids: Secondary | ICD-10-CM | POA: Insufficient documentation

## 2022-03-26 DIAGNOSIS — Z7901 Long term (current) use of anticoagulants: Secondary | ICD-10-CM | POA: Diagnosis not present

## 2022-03-26 DIAGNOSIS — R109 Unspecified abdominal pain: Secondary | ICD-10-CM | POA: Insufficient documentation

## 2022-03-26 DIAGNOSIS — C7951 Secondary malignant neoplasm of bone: Secondary | ICD-10-CM

## 2022-03-26 LAB — I-STAT CHEM 8, ED
BUN: 12 mg/dL (ref 8–23)
Calcium, Ion: 1.16 mmol/L (ref 1.15–1.40)
Chloride: 108 mmol/L (ref 98–111)
Creatinine, Ser: 1.7 mg/dL — ABNORMAL HIGH (ref 0.61–1.24)
Glucose, Bld: 111 mg/dL — ABNORMAL HIGH (ref 70–99)
HCT: 41 % (ref 39.0–52.0)
Hemoglobin: 13.9 g/dL (ref 13.0–17.0)
Potassium: 4.5 mmol/L (ref 3.5–5.1)
Sodium: 139 mmol/L (ref 135–145)
TCO2: 21 mmol/L — ABNORMAL LOW (ref 22–32)

## 2022-03-26 LAB — COMPREHENSIVE METABOLIC PANEL
ALT: 15 U/L (ref 0–44)
AST: 15 U/L (ref 15–41)
Albumin: 3.8 g/dL (ref 3.5–5.0)
Alkaline Phosphatase: 83 U/L (ref 38–126)
Anion gap: 7 (ref 5–15)
BUN: 14 mg/dL (ref 8–23)
CO2: 22 mmol/L (ref 22–32)
Calcium: 9.2 mg/dL (ref 8.9–10.3)
Chloride: 109 mmol/L (ref 98–111)
Creatinine, Ser: 1.75 mg/dL — ABNORMAL HIGH (ref 0.61–1.24)
GFR, Estimated: 41 mL/min — ABNORMAL LOW (ref >60)
Glucose, Bld: 115 mg/dL — ABNORMAL HIGH (ref 70–99)
Potassium: 4.6 mmol/L (ref 3.5–5.1)
Sodium: 138 mmol/L (ref 135–145)
Total Bilirubin: 0.3 mg/dL (ref 0.3–1.2)
Total Protein: 7.6 g/dL (ref 6.5–8.1)

## 2022-03-26 LAB — CBC WITH DIFFERENTIAL/PLATELET
Abs Immature Granulocytes: 0.02 10*3/uL (ref 0.00–0.07)
Basophils Absolute: 0 10*3/uL (ref 0.0–0.1)
Basophils Relative: 0 %
Eosinophils Absolute: 0.1 10*3/uL (ref 0.0–0.5)
Eosinophils Relative: 2 %
HCT: 42.5 % (ref 39.0–52.0)
Hemoglobin: 13.4 g/dL (ref 13.0–17.0)
Immature Granulocytes: 0 %
Lymphocytes Relative: 34 %
Lymphs Abs: 2.3 10*3/uL (ref 0.7–4.0)
MCH: 28.2 pg (ref 26.0–34.0)
MCHC: 31.5 g/dL (ref 30.0–36.0)
MCV: 89.3 fL (ref 80.0–100.0)
Monocytes Absolute: 0.5 10*3/uL (ref 0.1–1.0)
Monocytes Relative: 8 %
Neutro Abs: 3.8 10*3/uL (ref 1.7–7.7)
Neutrophils Relative %: 56 %
Platelets: 229 10*3/uL (ref 150–400)
RBC: 4.76 MIL/uL (ref 4.22–5.81)
RDW: 14.8 % (ref 11.5–15.5)
WBC: 6.8 10*3/uL (ref 4.0–10.5)
nRBC: 0 % (ref 0.0–0.2)

## 2022-03-26 LAB — TYPE AND SCREEN
ABO/RH(D): B POS
Antibody Screen: POSITIVE

## 2022-03-26 LAB — MAGNESIUM: Magnesium: 1.9 mg/dL (ref 1.7–2.4)

## 2022-03-26 MED ORDER — HYDROCODONE-ACETAMINOPHEN 5-325 MG PO TABS: 1.0000 | ORAL_TABLET | Freq: Four times a day (QID) | ORAL | 0 refills | Status: DC | PRN

## 2022-03-26 MED ORDER — IOHEXOL 300 MG/ML  SOLN
75.0000 mL | Freq: Once | INTRAMUSCULAR | Status: AC | PRN
  Administered 2022-03-26: 75 mL via INTRAVENOUS

## 2022-03-26 NOTE — ED Provider Notes (Addendum)
[place] DEPT Provider Note   CSN: [id number] Arrival date & time: 03/26/22  0857     History  Chief Complaint  Patient presents with   Flank Pain    Gregory Christensen is a 71 y.o. male.  Patient with complaint of right back right CVA pain for 2 days.  Is made worse by coughing made worse by moving.  No fall or injury.  No strain to the back that he knows of.  Patient has a history of pretty significant COPD and is on 2 L of oxygen at all times.  Patient is never had a history of kidney stones before.  He has been had a left nephrectomy in 2020 for kidney tumor.  Known to have chronic kidney disease stage III problem list does a history of kidney stones with patient denies it.  Had a robotic assisted laparoscopic nephro ureter rectum May done by Dr. Lovena Neighbours.  Also has chronic back pain listed on his problem list.  Patient still occasionally smokes cigarettes.       Home Medications Prior to Admission medications   Medication Sig Start Date End Date Taking? Authorizing Provider  albuterol (PROVENTIL) (2.5 MG/3ML) 0.083% nebulizer solution Take 3 mLs (2.5 mg total) by nebulization every 6 (six) hours as needed for wheezing or shortness of breath. 10/31/21   Freddi Starr, MD  albuterol (VENTOLIN HFA) 108 (90 Base) MCG/ACT inhaler Inhale 2 puffs into the lungs every 4 (four) hours as needed for wheezing or shortness of breath. 10/23/21   Aline August, MD  apixaban (ELIQUIS) 5 MG TABS tablet Take 1 tablet (5 mg total) by mouth 2 (two) times daily. 06/10/18   Regalado, Belkys A, MD  ascorbic acid (VITAMIN C) 500 MG tablet Take 500 mg by mouth in the morning and at bedtime. 05/10/21   [provider]  atorvastatin (LIPITOR) 80 MG tablet Take 80 mg by mouth daily.    [provider]  azithromycin (ZITHROMAX) 250 MG tablet Take as directed 02/27/22   Freddi Starr, MD  buPROPion [facility] SR) 150 MG 12 hr tablet 2 (two) times daily.  11/09/21   [provider]  Dextran 70-Hypromellose (ARTIFICIAL TEARS) 0.1-0.3 % SOLN Place 1 drop into both eyes daily as needed (dry eyes).    [provider]  diclofenac Sodium (VOLTAREN) 1 % GEL Apply 2 g topically 2 (two) times daily as needed (Pain).    [provider]  docusate sodium (COLACE) 100 MG capsule Take 100 mg by mouth daily as needed for mild constipation.    [provider]  ezetimibe (ZETIA) 10 MG tablet Take 1 tablet (10 mg total) by mouth daily. 08/18/21   Chandrasekhar, Lyda Kalata A, MD  ferrous sulfate 325 (65 FE) MG tablet Take 1 tablet (325 mg total) by mouth daily. 06/21/21 06/21/22  Shelly Coss, MD  finasteride (PROSCAR) 5 MG tablet Take 5 mg by mouth daily. 09/28/21   [provider]  fluticasone (FLONASE) 50 MCG/ACT nasal spray Place into both nostrils as needed for allergies or rhinitis.    [provider]  fluticasone-salmeterol (WIXELA INHUB) 250-50 MCG/ACT AEPB Inhale 1 puff into the lungs in the morning and at bedtime.    [provider]  gabapentin (NEURONTIN) 300 MG capsule Take 300 mg by mouth 3 (three) times daily.    [provider]  levocetirizine (XYZAL) 5 MG tablet Take 5 mg by mouth daily as needed for allergies.    [provider]  lidocaine (LIDODERM) 5 % Place 1 patch onto the skin every 12 (twelve) hours as needed (pain).    [provider]  metoprolol succinate (TOPROL XL) 25 MG 24 hr tablet Take 1 tablet (25 mg total) by mouth at bedtime. 10/31/21 11/01/22  Swinyer, Lanice Schwab, NP  montelukast (SINGULAIR) 10 MG tablet Take 10 mg by mouth at bedtime.    [provider]  nicotine (NICODERM CQ - DOSED IN MG/24 HOURS) 21 mg/24hr patch Place 1 patch (21 mg total) onto the skin daily. 10/23/21   Aline August, MD  nitroGLYCERIN (NITROSTAT) 0.4 MG SL tablet DISSOLVE ONE TABLET UNDER THE TONGUE EVERY 5 MINUTES AS NEEDED FOR CHEST PAIN.  DO NOT EXCEED A TOTAL OF 3 DOSES  IN 15 MINUTES NOW 12/05/21   Chandrasekhar, Lyda Kalata A, MD  pantoprazole (PROTONIX) 40 MG tablet Take 1 tablet (40 mg total) by mouth 2 (two) times daily. 10/23/21 10/23/22  Aline August, MD  sildenafil (VIAGRA) 100 MG tablet Take 100 mg by mouth daily as needed for erectile dysfunction. 01/25/20   [provider]  tamsulosin (FLOMAX) 0.4 MG CAPS capsule Take 0.4 mg by mouth at bedtime.    [provider]  Tiotropium Bromide Monohydrate (SPIRIVA RESPIMAT) 2.5 MCG/ACT AERS Inhale 2 puffs into the lungs daily.    [provider]      Allergies    Patient has no known allergies.    Review of Systems   Review of Systems  Constitutional:  Negative for chills and fever.  HENT:  Negative for ear pain and sore throat.   Eyes:  Negative for pain and visual disturbance.  Respiratory:  Negative for cough and shortness of breath.   Cardiovascular:  Negative for chest pain and palpitations.  Gastrointestinal:  Negative for abdominal pain and vomiting.  Genitourinary:  Positive for flank pain. Negative for dysuria and hematuria.  Musculoskeletal:  Negative for arthralgias and back pain.  Skin:  Negative for color change and rash.  Neurological:  Negative for seizures and syncope.  All other systems reviewed and are negative.   Physical Exam Updated Vital Signs BP (!) 152/82 (BP Location: Right Arm)   Pulse 75   Temp 97.6 F (36.4 C) (Oral)   Resp 18   Ht 1.854 m ('6\' 1"'$ )   Wt 77.1 kg   SpO2 94%   BMI 22.43 kg/m  Physical Exam Vitals and nursing note reviewed.  Constitutional:      General: He is not in acute distress.    Appearance: Normal appearance. He is well-developed.  HENT:     Head: Normocephalic and atraumatic.  Eyes:     Extraocular Movements: Extraocular movements intact.     Conjunctiva/sclera: Conjunctivae normal.     Pupils: Pupils are equal, round, and reactive to light.  Cardiovascular:     Rate and Rhythm: Normal rate and regular rhythm.      Heart sounds: No murmur heard. Pulmonary:     Effort: Pulmonary effort is normal. No respiratory distress.     Breath sounds: Wheezing present.  Abdominal:     Palpations: Abdomen is soft.     Tenderness: There is abdominal tenderness.     Comments: Right CVA area tenderness  Musculoskeletal:        General: No swelling.     Cervical back: Normal range of motion and neck supple. No rigidity.     Right lower leg: No edema.     Left lower leg: No edema.  Skin:  General: Skin is warm and dry.     Capillary Refill: Capillary refill takes less than 2 seconds.  Neurological:     General: No focal deficit present.     Mental Status: He is alert and oriented to person, place, and time.     Cranial Nerves: No cranial nerve deficit.     Sensory: No sensory deficit.  Psychiatric:        Mood and Affect: Mood normal.     ED Results / Procedures / Treatments   Labs (all labs ordered are listed, but only abnormal results are displayed) Labs Reviewed  I-STAT CHEM 8, ED - Abnormal; Notable for the following components:      Result Value   Creatinine, Ser 1.70 (*)    Glucose, Bld 111 (*)    TCO2 21 (*)    All other components within normal limits  CBC WITH DIFFERENTIAL/PLATELET  COMPREHENSIVE METABOLIC PANEL  URINALYSIS, ROUTINE W REFLEX MICROSCOPIC  MAGNESIUM  TYPE AND SCREEN    EKG None  Radiology No results found.  Procedures Procedures    Medications Ordered in ED Medications - No data to display  ED Course/ Medical Decision Making/ A&P                           Medical Decision Making Amount and/or Complexity of Data Reviewed Radiology: ordered.  Risk Prescription drug management.   Patient oxygen saturation 94% on his 2 L of oxygen that he is on at all times.  Patient's lungs with some faint wheezing but he feels as if his breathing is baseline.  Patient with tenderness to palpation right CVA area.  Patient will need urinalysis labs for CT renal to rule out  kidney stone.  Reported that he had different blood pressures in the upper extremities.  But is not hypotensive.  This raises concerns for some peripheral vascular disease and may be an occlusion.  May need follow-up with vascular surgery for further evaluation of that.  Patient with some wheezing bilaterally but he feels that this is baseline not in any respiratory distress.  Patient pain also could be musculoskeletal in nature.  But CT renal be done to rule out kidney stone.  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  Will treat with pain medicine.  Patient was scheduled for CT scan screening follow-up for his renal cell carcinoma 2020 with Dr. Lovena Neighbours from urology tomorrow.  Patient is going to contact his office in the morning he may not need the scans.  He also has follow-up with Dr. Lovena Neighbours on Friday.  Additional evaluation of this T11 lesion concern for missed metastatic disease will be necessary.  Patient understands.  Patient's labs today's renal function is not significantly changed from baseline CBC is normal platelets are normal patient GFR is 41 has been around 40 in the past magnesium was normal.  Patient stable for discharge home and close follow-up with [facility] Dr. Lovena Neighbours   Final Clinical Impression(s) / ED Diagnoses Final diagnoses:  Right flank pain    Rx / DC Orders ED Discharge Orders     None         Fredia Sorrow, MD 03/26/22 1002    Fredia Sorrow, MD 03/26/22 1407

## 2022-03-26 NOTE — ED Triage Notes (Signed)
Per EMS- Patient c/o right flank pain x 2 days. Patient has had a left nephrectomy. Patient denies hematuria or dysuria.  EMS gave Fentanyl 100 mcg prior to arrival to the ED.

## 2022-03-26 NOTE — ED Provider Triage Note (Signed)
Emergency Medicine Provider Triage Evaluation Note  Gregory Christensen , a 71 y.o. male  was evaluated in triage.  Pt complains of right flank pain for the past few days that has been worsening. 165mg of fentanyl given via EMS. On Chronic 2L of COPD. 4 puffs of rescue inhaler given since he has not had is maintenance therapy. BP difference.  Review of Systems  Positive:  Negative:   Physical Exam  There were no vitals taken for this visit. Gen:   Awake, no distress   Resp:  Normal effort  MSK:   Moves extremities without difficulty  Other:  Pulse is lighter on the right side.   Medical Decision Making  Medically screening exam initiated at 9:02 AM.  Appropriate orders placed.  Gregory Christensen was informed that the remainder of the evaluation will be completed by another provider, this initial triage assessment does not replace that evaluation, and the importance of remaining in the ED until their evaluation is complete.  Discussed this case with my attending on this chart who recommended CT renal.    RSherrell Puller PA-C 03/26/22 0915

## 2022-03-26 NOTE — Discharge Instructions (Signed)
Here today raise concerns for a lytic lesion in your 11th thoracic vertebrae.  This could be recurrent kidney cancer or could be something else.  There is some degree of fracture there.  Check in with Dr. Gilford Rile office tomorrow prior to getting your CAT scans because I think we completed all the scans that she needed to have done.  And also definitely keep your appointment with him for Friday.  You may need additional work-up for the findings.  Take the pain medicine as directed.

## 2022-04-02 DIAGNOSIS — R109 Unspecified abdominal pain: Secondary | ICD-10-CM | POA: Diagnosis not present

## 2022-04-03 ENCOUNTER — Telehealth: Payer: Self-pay | Admitting: Oncology

## 2022-04-03 NOTE — Telephone Encounter (Signed)
Scheduled appt per 10/3 referral. Pt is aware of appt date and time. Pt is aware to arrive 15 mins prior to appt time and to bring and updated insurance card. Pt is aware of appt location.   

## 2022-04-05 DIAGNOSIS — I129 Hypertensive chronic kidney disease with stage 1 through stage 4 chronic kidney disease, or unspecified chronic kidney disease: Secondary | ICD-10-CM | POA: Diagnosis not present

## 2022-04-05 DIAGNOSIS — N183 Chronic kidney disease, stage 3 unspecified: Secondary | ICD-10-CM | POA: Diagnosis not present

## 2022-04-17 ENCOUNTER — Other Ambulatory Visit: Payer: Self-pay

## 2022-04-17 ENCOUNTER — Inpatient Hospital Stay: Payer: No Typology Code available for payment source | Attending: Oncology | Admitting: Oncology

## 2022-04-17 VITALS — BP 101/74 | HR 79 | Temp 98.0°F | Resp 16 | Ht 73.0 in | Wt 167.5 lb

## 2022-04-17 DIAGNOSIS — I129 Hypertensive chronic kidney disease with stage 1 through stage 4 chronic kidney disease, or unspecified chronic kidney disease: Secondary | ICD-10-CM

## 2022-04-17 DIAGNOSIS — M898X8 Other specified disorders of bone, other site: Secondary | ICD-10-CM | POA: Diagnosis not present

## 2022-04-17 DIAGNOSIS — F17210 Nicotine dependence, cigarettes, uncomplicated: Secondary | ICD-10-CM

## 2022-04-17 DIAGNOSIS — C652 Malignant neoplasm of left renal pelvis: Secondary | ICD-10-CM | POA: Diagnosis not present

## 2022-04-17 DIAGNOSIS — N1831 Chronic kidney disease, stage 3a: Secondary | ICD-10-CM

## 2022-04-17 MED ORDER — DIAZEPAM 5 MG PO TABS: ORAL_TABLET | ORAL | 0 refills | Status: DC

## 2022-04-17 MED ORDER — OXYCODONE-ACETAMINOPHEN 10-325 MG PO TABS: 1.0000 | ORAL_TABLET | ORAL | 0 refills | Status: DC | PRN

## 2022-04-17 NOTE — Progress Notes (Signed)
Reason for the request:    Spinal tumor  HPI: I was asked by Dr. Lovena Neighbours to evaluate Gregory Christensen for the evaluation of a spinal tumor.  He is a 71 year old with urothelial carcinoma involving the renal pelvis that was diagnosed in 2020.  He was found to have T2N0 tumor after left robotic nephro ureterectomy completed on Oct 31, 2018.  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  He remained on active surveillance without any relapsed disease.  He presented in September 2023 after complaints of back pain.  He underwent his CT scan chest abdomen and pelvis on March 26, 2022.  CT scan showed a lytic lesion in the inferior right T11 vertebral body with a small fracture in the inferior endplate.  He also had a 2.8 x 2.0 cm right upper lobe consolidation.  Although this does not appear to be malignant in appearance.  Based on these findings he was referred for an evaluation.  Clinically, he reports continuous back pain although no neurological deficits.  He is still able to walk and ambulate without any deficits.  His pain is middle of his back without any radiculopathy.  He denies any falls or syncope.  Still attends to activities of daily living.  Continues to be oxygen dependent and smokes about 1 pack a day.  He does not report any headaches, blurry vision, syncope or seizures. Does not report any fevers, chills or sweats.  Does not report any cough, wheezing or hemoptysis.  Does not report any chest pain, palpitation, orthopnea or leg edema.  Does not report any nausea, vomiting or abdominal pain.  Does not report any constipation or diarrhea.  Does not report any skeletal complaints.    Does not report frequency, urgency or hematuria.  Does not report any skin rashes or lesions. Does not report any heat or cold intolerance.  Does not report any lymphadenopathy or petechiae.  Does not report any anxiety or  depression.  Remaining review of systems is negative.     Past Medical History:  Diagnosis Date   Acute deep vein thrombosis (DVT) of left lower extremity (HCC)    Acute hypoxemic respiratory failure (HCC)    Anemia    Iron deficiency   Anxiety    Aortic atherosclerosis (HCC)    Arthritis    Benign prostatic hyperplasia 08/28/2018   UNSPECIFIED WHETHER LOWER URINARY TRACT SYMPTOMS PRESENT   CAD (coronary artery disease)    Cancer (HCC)    Chronic back pain    Chronic hip pain    CKD stage 3a 03/19/2021   Constipation    due to Iron M-W-F   COPD exacerbation ([place]) 10/18/2017   Dyspnea    Emphysema lung ([place])    Essential hypertension    GERD (gastroesophageal reflux disease)    Gout    Gross hematuria 09/08/2018   Head injury, acute, with loss of consciousness ([place])    unsure how long he was unresponsive   History of blood transfusion 05/2019   History of kidney stones    History of tobacco abuse    Hypoxia    Insomnia    Oxygen dependent    2l- 24/7   Pneumonia    in 1980's   Pre-diabetes    Pulmonary embolism ([place]) 05/31/2018   Renal mass    CONCERNING FOR RENAL CELL CARCINOMA DR. Lovena Neighbours   Respiratory distress    Respiratory failure, acute-on-chronic ([place]) 08/03/2018  Seasonal allergies    Sleep apnea   :   Past Surgical History:  Procedure Laterality Date   BIOPSY  06/19/2021   Procedure: BIOPSY;  Surgeon: Wilford Corner, MD;  Location: [facility];  Service: Endoscopy;;   COLONOSCOPY WITH PROPOFOL N/A 09/27/2019   Procedure: COLONOSCOPY WITH PROPOFOL;  Surgeon: Irene Shipper, MD;  Location: [facility];  Service: Endoscopy;  Laterality: N/A;   CYSTOSCOPY/URETEROSCOPY/HOLMIUM LASER/STENT PLACEMENT Left 10/31/2018   Procedure: CYSTOSCOPY/URETEROSCOPY;  Surgeon: Ceasar Mons, MD;  Location: [facility];  Service: Urology;  Laterality: Left;   ESOPHAGOGASTRODUODENOSCOPY (EGD) WITH PROPOFOL N/A 09/27/2019   Procedure: ESOPHAGOGASTRODUODENOSCOPY (EGD)  WITH PROPOFOL;  Surgeon: Irene Shipper, MD;  [facility];  Service: Endoscopy;  Laterality: N/A;   ESOPHAGOGASTRODUODENOSCOPY (EGD) WITH PROPOFOL N/A 06/19/2021   Procedure: ESOPHAGOGASTRODUODENOSCOPY (EGD) WITH PROPOFOL;  Surgeon: Wilford Corner, MD;  Location: [facility];  Service: Endoscopy;  Laterality: N/A;   GIVENS CAPSULE STUDY N/A 06/20/2021   Procedure: GIVENS CAPSULE STUDY;  Surgeon: Wilford Corner, MD;  Location: [facility];  Service: Endoscopy;  Laterality: N/A;   HERNIA REPAIR Left 1977   inguinal   HYDROCELE EXCISION Left 05/13/2019   Procedure: HYDROCELECTOMY ADULT;  Surgeon: Ceasar Mons, MD;  Location: [facility];  Service: Urology;  Laterality: Left;   [place]   EXTENSIVE   POLYPECTOMY  09/27/2019   Procedure: POLYPECTOMY;  Surgeon: Irene Shipper, MD;  [facility];  Service: Endoscopy;;   ROBOT ASSITED LAPAROSCOPIC NEPHROURETERECTOMY Left 10/31/2018   Procedure: XI ROBOT ASSITED LAPAROSCOPIC NEPHROURETERECTOMY;  Surgeon: Ceasar Mons, MD;  Location: [facility];  Service: Urology;  Laterality: Left;  ONLY NEEDS 240 MIN FOR ALL PROCEDURES   SHOULDER ARTHROSCOPY WITH ROTATOR CUFF REPAIR Left 04/12/2020   Procedure: LEFT SHOULDER ARTHROSCOPY, DEBRIDEMENT, BICEPS TENODESIS, MINI OPEN ROTATOR CUFF TEAR REPAIR;  Surgeon: Meredith Pel, MD;  Location: [facility];  Service: Orthopedics;  Laterality: Left;  :   Current Outpatient Medications:    albuterol (PROVENTIL) (2.5 MG/3ML) 0.083% nebulizer solution, Take 3 mLs (2.5 mg total) by nebulization every 6 (six) hours as needed for wheezing or shortness of breath., Disp: 180 mL, Rfl: 4   albuterol (VENTOLIN HFA) 108 (90 Base) MCG/ACT inhaler, Inhale 2 puffs into the lungs every 4 (four) hours as needed for wheezing or shortness of breath., Disp: 1 each, Rfl: 0   apixaban (ELIQUIS) 5 MG TABS tablet, Take 1 tablet (5 mg total) by mouth 2 (two) times daily., Disp: 60 tablet,  Rfl: 1   ascorbic acid (VITAMIN C) 500 MG tablet, Take 500 mg by mouth in the morning and at bedtime., Disp: , Rfl:    atorvastatin (LIPITOR) 80 MG tablet, Take 80 mg by mouth daily., Disp: , Rfl:    azithromycin (ZITHROMAX) 250 MG tablet, Take as directed, Disp: 6 tablet, Rfl: 0   buPROPion (WELLBUTRIN SR) 150 MG 12 hr tablet, 2 (two) times daily., Disp: , Rfl:    Dextran 70-Hypromellose (ARTIFICIAL TEARS) 0.1-0.3 % SOLN, Place 1 drop into both eyes daily as needed (dry eyes)., Disp: , Rfl:    diclofenac Sodium (VOLTAREN) 1 % GEL, Apply 2 g topically 2 (two) times daily as needed (Pain)., Disp: , Rfl:    docusate sodium (COLACE) 100 MG capsule, Take 100 mg by mouth daily as needed for mild constipation., Disp: , Rfl:    ezetimibe (ZETIA) 10 MG tablet, Take 1 tablet (10 mg total) by mouth daily., Disp: 90 tablet, Rfl: 3  ferrous sulfate 325 (65 FE) MG tablet, Take 1 tablet (325 mg total) by mouth daily., Disp: 30 tablet, Rfl: 1   finasteride (PROSCAR) 5 MG tablet, Take 5 mg by mouth daily., Disp: , Rfl:    fluticasone (FLONASE) 50 MCG/ACT nasal spray, Place into both nostrils as needed for allergies or rhinitis., Disp: , Rfl:    fluticasone-salmeterol (WIXELA INHUB) 250-50 MCG/ACT AEPB, Inhale 1 puff into the lungs in the morning and at bedtime., Disp: , Rfl:    gabapentin (NEURONTIN) 300 MG capsule, Take 300 mg by mouth 3 (three) times daily., Disp: , Rfl:    HYDROcodone-acetaminophen (NORCO/VICODIN) 5-325 MG tablet, Take 1 tablet by mouth every 6 (six) hours as needed for moderate pain., Disp: 20 tablet, Rfl: 0   levocetirizine (XYZAL) 5 MG tablet, Take 5 mg by mouth daily as needed for allergies., Disp: , Rfl:    lidocaine (LIDODERM) 5 %, Place 1 patch onto the skin every 12 (twelve) hours as needed (pain)., Disp: , Rfl:    metoprolol succinate (TOPROL XL) 25 MG 24 hr tablet, Take 1 tablet (25 mg total) by mouth at bedtime., Disp: 90 tablet, Rfl: 3   montelukast (SINGULAIR) 10 MG tablet, Take  10 mg by mouth at bedtime., Disp: , Rfl:    nicotine (NICODERM CQ - DOSED IN MG/24 HOURS) 21 mg/24hr patch, Place 1 patch (21 mg total) onto the skin daily., Disp: 28 patch, Rfl: 0   nitroGLYCERIN (NITROSTAT) 0.4 MG SL tablet, DISSOLVE ONE TABLET UNDER THE TONGUE EVERY 5 MINUTES AS NEEDED FOR CHEST PAIN.  DO NOT EXCEED A TOTAL OF 3 DOSES IN 15 MINUTES NOW, Disp: 25 tablet, Rfl: 11   pantoprazole (PROTONIX) 40 MG tablet, Take 1 tablet (40 mg total) by mouth 2 (two) times daily., Disp: , Rfl:    sildenafil (VIAGRA) 100 MG tablet, Take 100 mg by mouth daily as needed for erectile dysfunction., Disp: , Rfl:    tamsulosin (FLOMAX) 0.4 MG CAPS capsule, Take 0.4 mg by mouth at bedtime., Disp: , Rfl:    Tiotropium Bromide Monohydrate (SPIRIVA RESPIMAT) 2.5 MCG/ACT AERS, Inhale 2 puffs into the lungs daily., Disp: , Rfl: :  No Known Allergies:   Family History  Problem Relation Age of Onset   Diabetes Mother    Hypertension Sister    Hypertension Brother   :   Social History   Socioeconomic History   Marital status: Divorced    Spouse name: Not on file   Number of children: Not on file   Years of education: Not on file   Highest education level: Not on file  Occupational History   Not on file  Tobacco Use   Smoking status: Some Days    Packs/day: 0.50    Years: 57.00    Total pack years: 28.50    Types: Cigarettes   Smokeless tobacco: Never  Vaping Use   Vaping Use: Never used  Substance and Sexual Activity   Alcohol use: Yes    Alcohol/week: 10.0 standard drinks of alcohol    Types: 10 Glasses of wine per week   Drug use: Not Currently    Types: Flunitrazepam   Sexual activity: Not on file  Other Topics Concern   Not on file  Social History Narrative   Not on file   Social Determinants of Health   Financial Resource Strain: Not on file  Food Insecurity: Not on file  Transportation Needs: Not on file  Physical Activity: Not on file  Stress:  Not on file  Social  Connections: Not on file  Intimate Partner Violence: Not on file  :  Pertinent items are noted in HPI.  Exam: Blood pressure 101/74, pulse 79, temperature 98 F (36.7 C), temperature source Temporal, resp. rate 16, height $RemoveBe'6\' 1"'XSEhEBAvl$  (1.854 m), weight 167 lb 8 oz (76 kg), SpO2 100 %.  General appearance: alert and cooperative appeared without distress. Head: atraumatic without any abnormalities. Eyes: conjunctivae/corneas clear. PERRL.  Sclera anicteric. Throat: lips, mucosa, and tongue normal; without oral thrush or ulcers. Resp: clear to auscultation bilaterally without rhonchi, wheezes or dullness to percussion. Cardio: regular rate and rhythm, S1, S2 normal, no murmur, click, rub or gallop GI: soft, non-tender; bowel sounds normal; no masses,  no organomegaly Skin: Skin color, texture, turgor normal. No rashes or lesions Lymph nodes: Cervical, supraclavicular, and axillary nodes normal. Neurologic: Grossly normal without any motor, sensory or deep tendon reflexes. Musculoskeletal: No joint deformity or effusion.    CT Abdomen Pelvis W Contrast  Result Date: 03/26/2022 CLINICAL DATA:  Back pain, renal cell carcinoma, status post left nephrectomy, new lytic lesion of T12 identified by prior same day noncontrast CT of the abdomen and pelvis * Tracking Code: BO * EXAM: CT CHEST, ABDOMEN, AND PELVIS WITH CONTRAST TECHNIQUE: Multidetector CT imaging of the chest, abdomen and pelvis was performed following the standard protocol during bolus administration of intravenous contrast. RADIATION DOSE REDUCTION: This exam was performed according to the departmental dose-optimization program which includes automated exposure control, adjustment of the mA and/or kV according to patient size and/or use of iterative reconstruction technique. CONTRAST:  55mL OMNIPAQUE IOHEXOL 300 MG/ML  SOLN COMPARISON:  Same-day CT abdomen pelvis, 03/26/2022 FINDINGS: CT CHEST FINDINGS Cardiovascular: Aortic atherosclerosis.  Normal heart size. Left coronary artery calcifications. No pericardial effusion. Mediastinum/Nodes: No enlarged mediastinal, hilar, or axillary lymph nodes. Thyroid gland, trachea, and esophagus demonstrate no significant findings. Lungs/Pleura: Severe centrilobular emphysema. Diffuse bilateral bronchial wall thickening. Irregular subpleural consolidation of the peripheral right upper lobe, a region measuring approximately 2.8 x 2.0 cm (series 3, image 29). No pleural effusion or pneumothorax. Musculoskeletal: No chest wall abnormality. Lytic lesion of the inferior right T11 vertebral body with a small fracture of the inferior endplate (series 5, image 69). CT ABDOMEN PELVIS FINDINGS Hepatobiliary: No solid liver abnormality is seen. No gallstones, gallbladder wall thickening, or biliary dilatation. Pancreas: Unremarkable. No pancreatic ductal dilatation or surrounding inflammatory changes. Spleen: Normal in size without significant abnormality. Adrenals/Urinary Tract: Adrenal glands are unremarkable. Status post left nephrectomy. No suspicious soft tissue or contrast enhancement in the nephrectomy bed. The right kidney is normal, without renal calculi, solid lesion, or hydronephrosis. Bladder is unremarkable. Stomach/Bowel: Stomach is within normal limits. Appendix appears normal. No evidence of bowel wall thickening, distention, or inflammatory changes. Occasional descending diverticula. Vascular/Lymphatic: Aortic atherosclerosis. Predominantly noncalcific atherosclerosis of the proximal superior mesenteric artery, with greater than 50% stenosis (series 2, image 65, series 5, image 79). No enlarged abdominal or pelvic lymph nodes. Reproductive: Mild prostatomegaly. 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 Small, fat containing bilateral inguinal hernias (series 2, image 111). No ascites. Musculoskeletal: No acute osseous findings. Four  lumbar type non rib-bearing vertebral bodies. IMPRESSION: 1. Status post left nephrectomy. No suspicious soft tissue or contrast enhancement in the nephrectomy bed to suggest local recurrence. 2. Lytic lesion of the inferior right T11 vertebral body with a small fracture of the inferior endplate, highly suspicious for  an osseous metastasis. Anatomic note: This lesion was described as involving the T12 vertebral body on prior examination; for the purposes of this examination, vertebral bodies are numbered from the first rib-bearing thoracic vertebral body, of which there are 12, and there is transitional anatomy of the lumbar spine with 4 non-rib-bearing lumbar vertebral bodies. 3. No other evidence of lymphadenopathy or metastatic disease in the chest, abdomen, or pelvis. 4. Irregular subpleural consolidation of the peripheral right upper lobe, a region measuring approximately 2.8 x 2.0 cm, nonspecific and most likely infectious/inflammatory or scarring. Metastatic disease distinctly not favored given morphology and absence of discrete pulmonary nodules. Attention on follow-up. 5. Severe emphysema and diffuse bilateral bronchial wall thickening. 6. Aortic atherosclerosis. Predominantly noncalcific atherosclerosis of the proximal superior mesenteric artery, with greater than 50% stenosis. Correlate for referable symptoms. 7. 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 8. Coronary artery disease. Aortic Atherosclerosis (ICD10-I70.0) and Emphysema (ICD10-J43.9). Electronically Signed   By: Delanna Ahmadi M.D.   On: 03/26/2022 13:18   CT Chest W Contrast  Result Date: 03/26/2022 CLINICAL DATA:  Back pain, renal cell carcinoma, status post left nephrectomy, new lytic lesion of T12 identified by prior same day noncontrast CT of the abdomen and pelvis * Tracking Code: BO * EXAM: CT CHEST, ABDOMEN, AND PELVIS WITH CONTRAST TECHNIQUE: Multidetector CT imaging of the chest,  abdomen and pelvis was performed following the standard protocol during bolus administration of intravenous contrast. RADIATION DOSE REDUCTION: This exam was performed according to the departmental dose-optimization program which includes automated exposure control, adjustment of the mA and/or kV according to patient size and/or use of iterative reconstruction technique. CONTRAST:  73mL OMNIPAQUE IOHEXOL 300 MG/ML  SOLN COMPARISON:  Same-day CT abdomen pelvis, 03/26/2022 FINDINGS: CT CHEST FINDINGS Cardiovascular: Aortic atherosclerosis. Normal heart size. Left coronary artery calcifications. No pericardial effusion. Mediastinum/Nodes: No enlarged mediastinal, hilar, or axillary lymph nodes. Thyroid gland, trachea, and esophagus demonstrate no significant findings. Lungs/Pleura: Severe centrilobular emphysema. Diffuse bilateral bronchial wall thickening. Irregular subpleural consolidation of the peripheral right upper lobe, a region measuring approximately 2.8 x 2.0 cm (series 3, image 29). No pleural effusion or pneumothorax. Musculoskeletal: No chest wall abnormality. Lytic lesion of the inferior right T11 vertebral body with a small fracture of the inferior endplate (series 5, image 69). CT ABDOMEN PELVIS FINDINGS Hepatobiliary: No solid liver abnormality is seen. No gallstones, gallbladder wall thickening, or biliary dilatation. Pancreas: Unremarkable. No pancreatic ductal dilatation or surrounding inflammatory changes. Spleen: Normal in size without significant abnormality. Adrenals/Urinary Tract: Adrenal glands are unremarkable. Status post left nephrectomy. No suspicious soft tissue or contrast enhancement in the nephrectomy bed. The right kidney is normal, without renal calculi, solid lesion, or hydronephrosis. Bladder is unremarkable. Stomach/Bowel: Stomach is within normal limits. Appendix appears normal. No evidence of bowel wall thickening, distention, or inflammatory changes. Occasional descending  diverticula. Vascular/Lymphatic: Aortic atherosclerosis. Predominantly noncalcific atherosclerosis of the proximal superior mesenteric artery, with greater than 50% stenosis (series 2, image 65, series 5, image 79). No enlarged abdominal or pelvic lymph nodes. Reproductive: Mild prostatomegaly. 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 Small, fat containing bilateral inguinal hernias (series 2, image 111). No ascites. Musculoskeletal: No acute osseous findings. Four lumbar type non rib-bearing vertebral bodies. IMPRESSION: 1. Status post left nephrectomy. No suspicious soft tissue or contrast enhancement in the nephrectomy bed to suggest local recurrence. 2. Lytic lesion of the inferior right  T11 vertebral body with a small fracture of the inferior endplate, highly suspicious for an osseous metastasis. Anatomic note: This lesion was described as involving the T12 vertebral body on prior examination; for the purposes of this examination, vertebral bodies are numbered from the first rib-bearing thoracic vertebral body, of which there are 12, and there is transitional anatomy of the lumbar spine with 4 non-rib-bearing lumbar vertebral bodies. 3. No other evidence of lymphadenopathy or metastatic disease in the chest, abdomen, or pelvis. 4. Irregular subpleural consolidation of the peripheral right upper lobe, a region measuring approximately 2.8 x 2.0 cm, nonspecific and most likely infectious/inflammatory or scarring. Metastatic disease distinctly not favored given morphology and absence of discrete pulmonary nodules. Attention on follow-up. 5. Severe emphysema and diffuse bilateral bronchial wall thickening. 6. Aortic atherosclerosis. Predominantly noncalcific atherosclerosis of the proximal superior mesenteric artery, with greater than 50% stenosis. Correlate for referable symptoms. 7. 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 8. Coronary artery disease. Aortic Atherosclerosis (ICD10-I70.0) and Emphysema (ICD10-J43.9). Electronically Signed   By: Delanna Ahmadi M.D.   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 Electronically Signed   By: Maurine Simmering M.D.   On: 03/26/2022 12:12   Result Date: 03/26/2022 CLINICAL DATA:  Flank pain, kidney stone suspected EXAM: CT ABDOMEN AND PELVIS WITHOUT CONTRAST TECHNIQUE: Multidetector CT imaging of the abdomen and pelvis was performed following the standard protocol without IV contrast. RADIATION DOSE REDUCTION: This exam was performed according to the departmental dose-optimization program which includes automated exposure control, adjustment of the mA and/or kV according to patient size and/or use of iterative reconstruction technique. COMPARISON:  CT 09/25/2019 FINDINGS: Lower chest: Unchanged emphysema.  No acute abnormality. Hepatobiliary: No focal liver abnormality is seen. The gallbladder is unremarkable. Pancreas: Unremarkable. No pancreatic ductal dilatation or surrounding inflammatory changes. Spleen: Normal in size without focal abnormality. Adrenals/Urinary Tract: The right adrenal gland is unremarkable. The left adrenal gland is surgically absent. Postsurgical changes of prior left nephrectomy. The right kidney is unremarkable without evidence of hydronephrosis or nephroureterolithiasis. The bladder is moderately distended. Stomach/Bowel: No evidence of bowel obstruction. The appendix is normal.Scattered colonic diverticula. Moderate stool burden in the ascending colon and hepatic flexure. No acute inflammatory process involving the bowel. Vascular/Lymphatic: Aortoiliac atherosclerosis. No AAA. No  lymphadenopathy. Reproductive: Enlarged prostate gland protruding into the posterior bladder. Other: Small fat containing inguinal hernias, left greater than right. No bowel containing hernia. No ascites. No free air. Musculoskeletal: There is a new lytic bone lesion within the right aspect of the T12 vertebral body with cortical buckling of the inferior endplate (see series 6, image 67). Chronic lower rib injuries. No acute osseous abnormality. Multilevel degenerative disc disease of the spine, worst at L5-S1 with degenerate endplate changes and cystic change in S1, unchanged. IMPRESSION: New lytic lesion within the right aspect of the T12 vertebral body with cortical buckling of the inferior endplate, concerning for a metastasis. Patient is scheduled for a contrast enhanced CT of the abdomen and pelvis tomorrow for oncologic follow-up, which will further delineate other areas of potential metastases, if present. Would also add a contrast enhanced CT of the chest in addition. No other acute findings in the abdomen or pelvis. Moderate stool burden in the ascending  colon and hepatic flexure may suggest constipation. Electronically Signed: By: Maurine Simmering M.D. On: 03/26/2022 10:55   DG Chest 2 View  Result Date: 03/26/2022 CLINICAL DATA:  Cough. EXAM: CHEST - 2 VIEW COMPARISON:  October 18, 2021. FINDINGS: The heart size and mediastinal contours are within normal limits. Probable scarring is noted in the right upper lobe. Left lung is noted. The visualized skeletal structures are unremarkable. IMPRESSION: Probable right upper lobe scarring is noted. No definite acute abnormality is noted. Electronically Signed   By: Marijo Conception M.D.   On: 03/26/2022 10:14    Assessment and Plan:   71 year old with:  1.  T11 lytic lesion involving the vertebral body and small fracture of the inferior endplate that is suspicious for osseous metastatic disease.  He has no evidence of any primary malignancy on imaging studies  on September 23.  The differential diagnosis of these findings were discussed.  Metastatic urothelial carcinoma from his GU tract is a certainly a possibility but other malignancies should be considered.  Disease from a lung primary could also be considered.  Plasma cell disorder such as multiple myeloma could also be a consideration but considered less likely given his normal labs on September 25 including normal calcium and total protein.  From a management standpoint, tissue biopsy is mandatory in this setting.  A diagnostic and therapeutic approach that include kyphoplasty and biopsy would be ideal.  This could be neurosurgical technique versus interventional radiology.  I believe local therapy is his best option upfront with consideration for systemic therapy possibly if he develop other  After discussion today, we will obtain MRI of the spine and we will make referral to interventional radiology for bone biopsy and kyphoplasty.  Following the results possible adjuvant radiation therapy would be required.  2.  T2N0 involving the left renal pelvis diagnosed in 2020.  He is status post nephro ureterectomy without any additional treatment.  3.  Bone pain: Related to his metastatic disease.  Prescription for Percocet will be available to him.  4.  Follow-up: Will be determined after his work-up.  60  minutes were dedicated to this visit. The time was spent on reviewing laboratory data, imaging studies, discussing treatment options, discussing differential diagnosis and answering questions regarding future plan.    A copy of this consult has been forwarded to the requesting physician.

## 2022-04-18 ENCOUNTER — Encounter (HOSPITAL_COMMUNITY): Admission: RE | Payer: Self-pay | Source: Home / Self Care

## 2022-04-18 ENCOUNTER — Telehealth: Payer: Self-pay | Admitting: *Deleted

## 2022-04-18 ENCOUNTER — Other Ambulatory Visit: Payer: Self-pay | Admitting: Oncology

## 2022-04-18 ENCOUNTER — Ambulatory Visit (HOSPITAL_COMMUNITY): Admission: RE | Admit: 2022-04-18 | Payer: Medicare Other | Source: Home / Self Care | Admitting: Gastroenterology

## 2022-04-18 DIAGNOSIS — J449 Chronic obstructive pulmonary disease, unspecified: Secondary | ICD-10-CM | POA: Diagnosis not present

## 2022-04-18 SURGERY — COLONOSCOPY WITH PROPOFOL
Anesthesia: Monitor Anesthesia Care

## 2022-04-18 MED ORDER — OXYCODONE-ACETAMINOPHEN 10-325 MG PO TABS: 1.0000 | ORAL_TABLET | Freq: Four times a day (QID) | ORAL | 0 refills | Status: DC | PRN

## 2022-04-18 NOTE — Telephone Encounter (Signed)
PC to patient's daughter, Jonelle Sidle - no answer, left VM.  Informed her patient's MRI is scheduled for 05/03/22 at 11:30 at [facility]  However, it can be done sooner at [facility] or on the weekend - phone number to [employer] given so she can change the appointment if she wants, [phone].  Instructed her to call this office with any questions/concerns.

## 2022-04-25 ENCOUNTER — Other Ambulatory Visit (HOSPITAL_COMMUNITY): Payer: Medicare Other

## 2022-04-30 ENCOUNTER — Telehealth: Payer: Self-pay | Admitting: *Deleted

## 2022-04-30 ENCOUNTER — Other Ambulatory Visit: Payer: Self-pay | Admitting: Internal Medicine

## 2022-04-30 MED ORDER — OXYCODONE-ACETAMINOPHEN 10-325 MG PO TABS: 1.0000 | ORAL_TABLET | Freq: Four times a day (QID) | ORAL | 0 refills | Status: DC | PRN

## 2022-04-30 NOTE — Telephone Encounter (Signed)
Patient called requested Rx refill for Oxycodone 10-325.  Last refilled on 04/18/2022 (7.5 day supply which was 11 days ago).    Routed to covering provider.

## 2022-05-03 ENCOUNTER — Ambulatory Visit (HOSPITAL_COMMUNITY)
Admission: RE | Admit: 2022-05-03 | Discharge: 2022-05-03 | Disposition: A | Discharge status: Home / Self Care | Payer: Medicare Other | Source: Ambulatory Visit | Attending: Oncology | Admitting: Oncology

## 2022-05-03 DIAGNOSIS — M898X8 Other specified disorders of bone, other site: Secondary | ICD-10-CM | POA: Insufficient documentation

## 2022-05-03 DIAGNOSIS — M5124 Other intervertebral disc displacement, thoracic region: Secondary | ICD-10-CM | POA: Diagnosis not present

## 2022-05-03 DIAGNOSIS — C652 Malignant neoplasm of left renal pelvis: Secondary | ICD-10-CM

## 2022-05-03 MED ORDER — GADOBUTROL 1 MMOL/ML IV SOLN
7.0000 mL | Freq: Once | INTRAVENOUS | Status: AC | PRN
  Administered 2022-05-03: 7 mL via INTRAVENOUS

## 2022-05-04 ENCOUNTER — Other Ambulatory Visit: Payer: Self-pay | Admitting: Oncology

## 2022-05-04 MED ORDER — OXYCODONE-ACETAMINOPHEN 10-325 MG PO TABS: 1.0000 | ORAL_TABLET | Freq: Four times a day (QID) | ORAL | 0 refills | Status: DC | PRN

## 2022-05-07 DIAGNOSIS — E7800 Pure hypercholesterolemia, unspecified: Secondary | ICD-10-CM | POA: Diagnosis not present

## 2022-05-07 DIAGNOSIS — J449 Chronic obstructive pulmonary disease, unspecified: Secondary | ICD-10-CM | POA: Diagnosis not present

## 2022-05-07 DIAGNOSIS — N1832 Chronic kidney disease, stage 3b: Secondary | ICD-10-CM | POA: Diagnosis not present

## 2022-05-07 DIAGNOSIS — I2510 Atherosclerotic heart disease of native coronary artery without angina pectoris: Secondary | ICD-10-CM | POA: Diagnosis not present

## 2022-05-07 DIAGNOSIS — I10 Essential (primary) hypertension: Secondary | ICD-10-CM | POA: Diagnosis not present

## 2022-05-18 ENCOUNTER — Other Ambulatory Visit: Payer: Self-pay | Admitting: *Deleted

## 2022-05-18 DIAGNOSIS — C652 Malignant neoplasm of left renal pelvis: Secondary | ICD-10-CM

## 2022-05-18 MED ORDER — OXYCODONE-ACETAMINOPHEN 10-325 MG PO TABS: 1.0000 | ORAL_TABLET | Freq: Four times a day (QID) | ORAL | 0 refills | Status: DC | PRN

## 2022-05-19 DIAGNOSIS — J449 Chronic obstructive pulmonary disease, unspecified: Secondary | ICD-10-CM | POA: Diagnosis not present

## 2022-05-21 ENCOUNTER — Inpatient Hospital Stay: Payer: Medicare Other | Attending: Oncology | Admitting: Oncology

## 2022-05-21 VITALS — BP 100/72 | HR 96 | Temp 97.7°F | Resp 16 | Wt 165.1 lb

## 2022-05-21 DIAGNOSIS — Z9981 Dependence on supplemental oxygen: Secondary | ICD-10-CM | POA: Diagnosis not present

## 2022-05-21 DIAGNOSIS — M48061 Spinal stenosis, lumbar region without neurogenic claudication: Secondary | ICD-10-CM | POA: Insufficient documentation

## 2022-05-21 DIAGNOSIS — C652 Malignant neoplasm of left renal pelvis: Secondary | ICD-10-CM | POA: Diagnosis not present

## 2022-05-21 DIAGNOSIS — J449 Chronic obstructive pulmonary disease, unspecified: Secondary | ICD-10-CM | POA: Insufficient documentation

## 2022-05-21 DIAGNOSIS — C7951 Secondary malignant neoplasm of bone: Secondary | ICD-10-CM | POA: Diagnosis not present

## 2022-05-21 NOTE — Progress Notes (Signed)
Hematology and Oncology Follow Up Visit  Gregory Christensen [id number] 19-Jul-1950 71 y.o. 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  Currently undergoing evaluation.   Secondary diagnosis: Left renal pelvis cancer diagnosed in 2020.  He was found to have T2N0 after nephro ureterectomy completed by Dr. Lovena Neighbours on Oct 31, 2018.   Prior Therapy: left robotic nephro ureterectomy completed on Oct 31, 2018.  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   Current therapy: Under evaluation for possible additional treatment.  Interim History: [name] returns today for a follow-up.  Since the last visit, he reports no major changes in his health.  He continues to have mid back and right flank pain which has not changed dramatically at this time.  He denies any hematuria or dysuria.  He denies any worsening neuropathy.  He denies any change in his mobility.  He is limited in his performance status due to COPD continues to be oxygen dependent.     Medications: I have reviewed the patient's current medications.  Current Outpatient Medications  Medication Sig Dispense Refill   albuterol (PROVENTIL) (2.5 MG/3ML) 0.083% nebulizer solution Take 3 mLs (2.5 mg total) by nebulization every 6 (six) hours as needed for wheezing or shortness of breath. 180 mL 4   albuterol (VENTOLIN HFA) 108 (90 Base) MCG/ACT inhaler Inhale 2 puffs into the lungs every 4 (four) hours as needed for wheezing or shortness of breath. 1 each 0   apixaban (ELIQUIS) 5 MG TABS tablet Take 1 tablet (5 mg total) by mouth 2 (two) times daily. 60 tablet 1   ascorbic acid (VITAMIN C) 500 MG tablet Take 500 mg by mouth in the morning and at bedtime.     atorvastatin (LIPITOR) 80 MG tablet  Take 80 mg by mouth daily.     azithromycin (ZITHROMAX) 250 MG tablet Take as directed 6 tablet 0   buPROPion (WELLBUTRIN SR) 150 MG 12 hr tablet 2 (two) times daily.     Dextran 70-Hypromellose (ARTIFICIAL TEARS) 0.1-0.3 % SOLN Place 1 drop into both eyes daily as needed (dry eyes).     diazepam (VALIUM) 5 MG tablet Take the day of MRI as needed 5 tablet 0   diclofenac Sodium (VOLTAREN) 1 % GEL Apply 2 g topically 2 (two) times daily as needed (Pain).     docusate sodium (COLACE) 100 MG capsule Take 100 mg by mouth daily as needed for mild constipation.     ezetimibe (ZETIA) 10 MG tablet Take 1 tablet (10 mg total) by mouth daily. 90 tablet 3   ferrous sulfate 325 (65 FE) MG tablet Take 1 tablet (325 mg total) by mouth daily. 30 tablet 1   finasteride (PROSCAR) 5 MG tablet Take 5 mg by mouth daily.     fluticasone (FLONASE) 50 MCG/ACT nasal spray Place into both nostrils as needed for allergies or rhinitis.     fluticasone-salmeterol (WIXELA INHUB) 250-50 MCG/ACT AEPB Inhale 1 puff into the lungs in the morning and at bedtime.     gabapentin (NEURONTIN) 300 MG capsule Take 300 mg by mouth 3 (three) times daily.     levocetirizine (XYZAL) 5 MG tablet Take 5 mg by mouth daily as needed for allergies.     lidocaine (LIDODERM) 5 % Place 1 patch onto the skin  every 12 (twelve) hours as needed (pain).     METHENAMINE HIPPURATE PO Take 10 mg by mouth 2 (two) times daily.     metoprolol succinate (TOPROL XL) 25 MG 24 hr tablet Take 1 tablet (25 mg total) by mouth at bedtime. 90 tablet 3   montelukast (SINGULAIR) 10 MG tablet Take 10 mg by mouth at bedtime.     nicotine (NICODERM CQ - DOSED IN MG/24 HOURS) 21 mg/24hr patch Place 1 patch (21 mg total) onto the skin daily. 28 patch 0   nitroGLYCERIN (NITROSTAT) 0.4 MG SL tablet DISSOLVE ONE TABLET UNDER THE TONGUE EVERY 5 MINUTES AS NEEDED FOR CHEST PAIN.  DO NOT EXCEED A TOTAL OF 3 DOSES IN 15 MINUTES NOW 25 tablet 11   oxyCODONE-acetaminophen (PERCOCET)  10-325 MG tablet Take 1 tablet by mouth every 6 (six) hours as needed for pain. 45 tablet 0   pantoprazole (PROTONIX) 40 MG tablet Take 1 tablet (40 mg total) by mouth 2 (two) times daily.     sildenafil (VIAGRA) 100 MG tablet Take 100 mg by mouth daily as needed for erectile dysfunction.     tamsulosin (FLOMAX) 0.4 MG CAPS capsule Take 0.4 mg by mouth at bedtime.     Tiotropium Bromide Monohydrate (SPIRIVA RESPIMAT) 2.5 MCG/ACT AERS Inhale 2 puffs into the lungs daily.     No current facility-administered medications for this visit.     Allergies: No Known Allergies   Physical Exam: Blood pressure 100/72, pulse 96, temperature 97.7 F (36.5 C), resp. rate 16, weight 165 lb 1.6 oz (74.9 kg), SpO2 96 %.  ECOG: 1    General appearance: Comfortable appearing without any discomfort Head: Normocephalic without any trauma Oropharynx: Mucous membranes are moist and pink without any thrush or ulcers. Eyes: Pupils are equal and round reactive to light. Lymph nodes: No cervical, supraclavicular, inguinal or axillary lymphadenopathy.   Heart:regular rate and rhythm.  S1 and S2 without leg edema. Lung: Clear without any rhonchi or wheezes.  No dullness to percussion. Abdomin: Soft, nontender, nondistended with good bowel sounds.  No hepatosplenomegaly. Musculoskeletal: No joint deformity or effusion.  Full range of motion noted. Neurological: No deficits noted on motor, sensory and deep tendon reflex exam. Skin: No petechial rash or dryness.  Appeared moist.     Lab Results: Lab Results  Component Value Date   WBC 6.8 03/26/2022   HGB 13.9 03/26/2022   HCT 41.0 03/26/2022   MCV 89.3 03/26/2022   PLT 229 03/26/2022     Chemistry      Component Value Date/Time   NA 139 03/26/2022 0956   NA 140 05/12/2021 1058   K 4.5 03/26/2022 0956   CL 108 03/26/2022 0956   CO2 22 03/26/2022 0940   BUN 12 03/26/2022 0956   BUN 13 05/12/2021 1058   CREATININE 1.70 (H) 03/26/2022 0956       Component Value Date/Time   CALCIUM 9.2 03/26/2022 0940   ALKPHOS 83 03/26/2022 0940   AST 15 03/26/2022 0940   ALT 15 03/26/2022 0940   BILITOT 0.3 03/26/2022 0940       Radiological Studies:  Narrative & Impression  CLINICAL DATA:  Malignant tumor of renal pelvis, left (HCC) C65.2 (ICD-10-CM).   EXAM: MRI THORACIC AND LUMBAR SPINE WITHOUT AND WITH CONTRAST   TECHNIQUE: Multiplanar and multiecho pulse sequences of the thoracic and lumbar spine were obtained without and with intravenous contrast.   CONTRAST:  91m GADAVIST GADOBUTROL 1 MMOL/ML IV SOLN  COMPARISON:  CT chest abdomen and pelvis September 25 20   FINDINGS: MRI THORACIC SPINE FINDINGS   Alignment:  Physiologic.   Vertebrae: A T1 hypointense, enhancing lesion is seen on the right side of the T11 vertebral body extending into the corresponding pedicle, correlating with lytic lesion described on prior CT and concerning for metastatic disease. There is a associated small inferior endplate fracture and mild bulging of the posterior wall on the right side without significant spinal canal stenosis. Minimal chronic compression fracture of the superior endplate of T4.   Cord:  Normal signal and morphology.   Paraspinal and other soft tissues: Irregular subpleural hyperintensity of the peripheral right upper lobe correlating finding described on prior CT of the chest.   Disc levels:   Small posterior disc protrusions at T2-3, T3-4, T4-5, T5-6, T7-8 and T10-11, causing minimal indentation on the thecal sac. No significant spinal canal or neural foraminal stenosis at any thoracic level.   MRI LUMBAR SPINE FINDINGS   Segmentation: A transitional lumbosacral vertebra is assumed to represent sacralized L5 level. Careful correlation with this numbering strategy prior to any procedural intervention would be recommended.   Alignment:  Small retrolisthesis of L4 over L5.   Vertebrae: A T1 hypointense,  enhancing lesion is seen in the L1 vertebral body at the inferior endplate. Given absence of significant degenerative disc disease at this level, this most likely represent a metastatic lesion. Endplate degenerative changes at L4-5.   Conus medullaris: Extends to the L1 level and appears normal.   Paraspinal and other soft tissues: Absent left kidney.   Disc levels:   L1-2: Mild-to-moderate facet degenerative changes. No significant spinal canal or neural foraminal stenosis.   L2-3: Disc bulge, moderate hypertrophic facet degenerative change ligamentum flavum redundancy resulting in moderate spinal canal stenosis with mild narrowing of the bilateral subarticular zones and mild right neural foraminal narrowing.   L3-4: Disc bulge, advanced hypertrophic facet degenerative change ligamentum flavum redundancy resulting in moderate spinal canal stenosis with mild narrowing of the bilateral subarticular zones and mild left neural foraminal narrowing.   L4-5: Loss of disc height, disc bulge with associated osteophytic component and advanced facet degenerative changes with ligamentum flavum redundancy resulting in mild spinal canal stenosis with prominent narrowing of the bilateral subarticular zones and moderate to severe bilateral neural foraminal narrowing.   L5-S1: No spinal canal or neural foraminal stenosis.   IMPRESSION: 1. 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 There is a small inferior endplate fracture at N39 with mild bulging of the posterior wall without significant spinal canal stenosis. 2. Degenerative changes of the lumbar spine with moderate spinal canal stenosis at L2-3 and L3-4 and moderate to severe bilateral neural foraminal narrowing at L4-5. 3. 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      Impression and Plan:   71 year old with:   1.  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    The differential diagnosis and management options were discussed at this time.  Metastatic urothelial carcinoma versus a different primary were discussed and ultimately will need tissue biopsy.  Kyphoplasty and a bone biopsy would be the ideal procedure which will serve as diagnostic and therapeutic endeavor.  Given the fact that he has no evidence of widespread metastatic disease local therapy with radiation after kyphoplasty could be considered as a definitive treatment and will defer systemic therapy if he has widespread metastatic disease.  This also  could be contingent on the final pathology.   2.  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  He was found to have T2N0 after nephroureterectomy.  He has not received any additional treatment since that time.   3.  Bone pain: He remains on Percocet with pain is manageable.   4.  Follow-up: In the next 4 to 6 weeks to follow his progress.   30  minutes were spent on this encounter.  Time was dedicated to updating his disease status, treatment choices and outlining future plan of care discussion.          Zola Button, MD 11/20/202310:14 AM

## 2022-05-23 ENCOUNTER — Other Ambulatory Visit: Payer: Self-pay | Admitting: Radiology

## 2022-05-23 DIAGNOSIS — D492 Neoplasm of unspecified behavior of bone, soft tissue, and skin: Secondary | ICD-10-CM

## 2022-05-24 ENCOUNTER — Emergency Department (HOSPITAL_COMMUNITY): Payer: Medicare Other

## 2022-05-24 ENCOUNTER — Other Ambulatory Visit: Payer: Self-pay | Admitting: Student

## 2022-05-24 ENCOUNTER — Inpatient Hospital Stay (HOSPITAL_COMMUNITY)
Admission: EM | Admit: 2022-05-24 | Discharge: 2022-05-27 | DRG: 190 | Disposition: A | Discharge status: Home / Self Care | Payer: Medicare Other | Attending: Student | Admitting: Student

## 2022-05-24 DIAGNOSIS — K219 Gastro-esophageal reflux disease without esophagitis: Secondary | ICD-10-CM | POA: Diagnosis not present

## 2022-05-24 DIAGNOSIS — N1831 Chronic kidney disease, stage 3a: Secondary | ICD-10-CM | POA: Diagnosis not present

## 2022-05-24 DIAGNOSIS — R0789 Other chest pain: Secondary | ICD-10-CM | POA: Diagnosis not present

## 2022-05-24 DIAGNOSIS — I700 Atherosclerosis of aorta: Secondary | ICD-10-CM | POA: Diagnosis not present

## 2022-05-24 DIAGNOSIS — Z9981 Dependence on supplemental oxygen: Secondary | ICD-10-CM

## 2022-05-24 DIAGNOSIS — C689 Malignant neoplasm of urinary organ, unspecified: Secondary | ICD-10-CM | POA: Insufficient documentation

## 2022-05-24 DIAGNOSIS — I2782 Chronic pulmonary embolism: Secondary | ICD-10-CM | POA: Diagnosis not present

## 2022-05-24 DIAGNOSIS — E1122 Type 2 diabetes mellitus with diabetic chronic kidney disease: Secondary | ICD-10-CM | POA: Diagnosis present

## 2022-05-24 DIAGNOSIS — C7951 Secondary malignant neoplasm of bone: Secondary | ICD-10-CM | POA: Diagnosis not present

## 2022-05-24 DIAGNOSIS — N401 Enlarged prostate with lower urinary tract symptoms: Secondary | ICD-10-CM | POA: Diagnosis not present

## 2022-05-24 DIAGNOSIS — C652 Malignant neoplasm of left renal pelvis: Secondary | ICD-10-CM | POA: Diagnosis not present

## 2022-05-24 DIAGNOSIS — N289 Disorder of kidney and ureter, unspecified: Secondary | ICD-10-CM | POA: Diagnosis not present

## 2022-05-24 DIAGNOSIS — I129 Hypertensive chronic kidney disease with stage 1 through stage 4 chronic kidney disease, or unspecified chronic kidney disease: Secondary | ICD-10-CM | POA: Diagnosis present

## 2022-05-24 DIAGNOSIS — E871 Hypo-osmolality and hyponatremia: Secondary | ICD-10-CM | POA: Diagnosis not present

## 2022-05-24 DIAGNOSIS — Z86010 Personal history of colonic polyps: Secondary | ICD-10-CM

## 2022-05-24 DIAGNOSIS — Z87442 Personal history of urinary calculi: Secondary | ICD-10-CM

## 2022-05-24 DIAGNOSIS — E119 Type 2 diabetes mellitus without complications: Secondary | ICD-10-CM

## 2022-05-24 DIAGNOSIS — I10 Essential (primary) hypertension: Secondary | ICD-10-CM | POA: Diagnosis not present

## 2022-05-24 DIAGNOSIS — E1169 Type 2 diabetes mellitus with other specified complication: Secondary | ICD-10-CM | POA: Diagnosis not present

## 2022-05-24 DIAGNOSIS — R1013 Epigastric pain: Secondary | ICD-10-CM | POA: Insufficient documentation

## 2022-05-24 DIAGNOSIS — N179 Acute kidney failure, unspecified: Secondary | ICD-10-CM | POA: Insufficient documentation

## 2022-05-24 DIAGNOSIS — I2510 Atherosclerotic heart disease of native coronary artery without angina pectoris: Secondary | ICD-10-CM | POA: Diagnosis present

## 2022-05-24 DIAGNOSIS — N138 Other obstructive and reflux uropathy: Secondary | ICD-10-CM | POA: Diagnosis not present

## 2022-05-24 DIAGNOSIS — J302 Other seasonal allergic rhinitis: Secondary | ICD-10-CM | POA: Diagnosis not present

## 2022-05-24 DIAGNOSIS — G4730 Sleep apnea, unspecified: Secondary | ICD-10-CM | POA: Diagnosis present

## 2022-05-24 DIAGNOSIS — J441 Chronic obstructive pulmonary disease with (acute) exacerbation: Secondary | ICD-10-CM | POA: Diagnosis not present

## 2022-05-24 DIAGNOSIS — J9621 Acute and chronic respiratory failure with hypoxia: Secondary | ICD-10-CM | POA: Diagnosis not present

## 2022-05-24 DIAGNOSIS — G8929 Other chronic pain: Secondary | ICD-10-CM | POA: Diagnosis not present

## 2022-05-24 DIAGNOSIS — R410 Disorientation, unspecified: Secondary | ICD-10-CM | POA: Diagnosis not present

## 2022-05-24 DIAGNOSIS — R0602 Shortness of breath: Secondary | ICD-10-CM | POA: Diagnosis not present

## 2022-05-24 DIAGNOSIS — Z8553 Personal history of malignant neoplasm of renal pelvis: Secondary | ICD-10-CM

## 2022-05-24 DIAGNOSIS — I2699 Other pulmonary embolism without acute cor pulmonale: Secondary | ICD-10-CM | POA: Diagnosis present

## 2022-05-24 DIAGNOSIS — F17210 Nicotine dependence, cigarettes, uncomplicated: Secondary | ICD-10-CM | POA: Diagnosis present

## 2022-05-24 DIAGNOSIS — D649 Anemia, unspecified: Secondary | ICD-10-CM | POA: Diagnosis present

## 2022-05-24 DIAGNOSIS — T68XXXA Hypothermia, initial encounter: Secondary | ICD-10-CM | POA: Diagnosis not present

## 2022-05-24 DIAGNOSIS — Z86711 Personal history of pulmonary embolism: Secondary | ICD-10-CM

## 2022-05-24 DIAGNOSIS — E785 Hyperlipidemia, unspecified: Secondary | ICD-10-CM | POA: Diagnosis present

## 2022-05-24 DIAGNOSIS — J439 Emphysema, unspecified: Secondary | ICD-10-CM | POA: Diagnosis not present

## 2022-05-24 DIAGNOSIS — G4700 Insomnia, unspecified: Secondary | ICD-10-CM | POA: Diagnosis present

## 2022-05-24 DIAGNOSIS — N183 Chronic kidney disease, stage 3 unspecified: Secondary | ICD-10-CM | POA: Diagnosis present

## 2022-05-24 DIAGNOSIS — M109 Gout, unspecified: Secondary | ICD-10-CM | POA: Diagnosis present

## 2022-05-24 DIAGNOSIS — E875 Hyperkalemia: Secondary | ICD-10-CM | POA: Diagnosis not present

## 2022-05-24 DIAGNOSIS — Z8249 Family history of ischemic heart disease and other diseases of the circulatory system: Secondary | ICD-10-CM

## 2022-05-24 DIAGNOSIS — R079 Chest pain, unspecified: Secondary | ICD-10-CM | POA: Diagnosis not present

## 2022-05-24 DIAGNOSIS — Z781 Physical restraint status: Secondary | ICD-10-CM

## 2022-05-24 DIAGNOSIS — Z7901 Long term (current) use of anticoagulants: Secondary | ICD-10-CM

## 2022-05-24 DIAGNOSIS — Z20822 Contact with and (suspected) exposure to covid-19: Secondary | ICD-10-CM | POA: Diagnosis not present

## 2022-05-24 DIAGNOSIS — F419 Anxiety disorder, unspecified: Secondary | ICD-10-CM | POA: Diagnosis present

## 2022-05-24 DIAGNOSIS — M549 Dorsalgia, unspecified: Secondary | ICD-10-CM | POA: Diagnosis present

## 2022-05-24 DIAGNOSIS — R338 Other retention of urine: Secondary | ICD-10-CM | POA: Diagnosis present

## 2022-05-24 DIAGNOSIS — Z79899 Other long term (current) drug therapy: Secondary | ICD-10-CM

## 2022-05-24 DIAGNOSIS — J80 Acute respiratory distress syndrome: Secondary | ICD-10-CM | POA: Diagnosis not present

## 2022-05-24 DIAGNOSIS — Z833 Family history of diabetes mellitus: Secondary | ICD-10-CM

## 2022-05-24 DIAGNOSIS — Z86718 Personal history of other venous thrombosis and embolism: Secondary | ICD-10-CM

## 2022-05-24 LAB — BLOOD GAS, ARTERIAL
Acid-Base Excess: 0 mmol/L (ref 0.0–2.0)
Bicarbonate: 24.8 mmol/L (ref 20.0–28.0)
Drawn by: 56037
O2 Content: 2 L/min
O2 Saturation: 99 %
Patient temperature: 36.7
pCO2 arterial: 40 mmHg (ref 32–48)
pH, Arterial: 7.4 (ref 7.35–7.45)
pO2, Arterial: 166 mmHg — ABNORMAL HIGH (ref 83–108)

## 2022-05-24 LAB — CBC WITH DIFFERENTIAL/PLATELET
Abs Immature Granulocytes: 0 10*3/uL (ref 0.00–0.07)
Basophils Absolute: 0 10*3/uL (ref 0.0–0.1)
Basophils Relative: 0 %
Eosinophils Absolute: 0.1 10*3/uL (ref 0.0–0.5)
Eosinophils Relative: 1 %
HCT: 41.5 % (ref 39.0–52.0)
Hemoglobin: 12.7 g/dL — ABNORMAL LOW (ref 13.0–17.0)
Immature Granulocytes: 0 %
Lymphocytes Relative: 27 %
Lymphs Abs: 1.6 10*3/uL (ref 0.7–4.0)
MCH: 27.9 pg (ref 26.0–34.0)
MCHC: 30.6 g/dL (ref 30.0–36.0)
MCV: 91.2 fL (ref 80.0–100.0)
Monocytes Absolute: 0.7 10*3/uL (ref 0.1–1.0)
Monocytes Relative: 12 %
Neutro Abs: 3.6 10*3/uL (ref 1.7–7.7)
Neutrophils Relative %: 60 %
Platelets: 242 10*3/uL (ref 150–400)
RBC: 4.55 MIL/uL (ref 4.22–5.81)
RDW: 14.8 % (ref 11.5–15.5)
WBC: 6 10*3/uL (ref 4.0–10.5)
nRBC: 0 % (ref 0.0–0.2)

## 2022-05-24 LAB — D-DIMER, QUANTITATIVE: D-Dimer, Quant: 0.41 ug/mL-FEU (ref 0.00–0.50)

## 2022-05-24 LAB — BASIC METABOLIC PANEL
Anion gap: 7 (ref 5–15)
BUN: 12 mg/dL (ref 8–23)
CO2: 26 mmol/L (ref 22–32)
Calcium: 9.5 mg/dL (ref 8.9–10.3)
Chloride: 103 mmol/L (ref 98–111)
Creatinine, Ser: 1.48 mg/dL — ABNORMAL HIGH (ref 0.61–1.24)
GFR, Estimated: 51 mL/min — ABNORMAL LOW (ref >60)
Glucose, Bld: 100 mg/dL — ABNORMAL HIGH (ref 70–99)
Potassium: 4 mmol/L (ref 3.5–5.1)
Sodium: 136 mmol/L (ref 135–145)

## 2022-05-24 LAB — HIV ANTIBODY (ROUTINE TESTING W REFLEX): HIV Screen 4th Generation wRfx: NONREACTIVE

## 2022-05-24 LAB — RESP PANEL BY RT-PCR (FLU A&B, COVID) ARPGX2
Influenza A by PCR: NEGATIVE
Influenza B by PCR: NEGATIVE
SARS Coronavirus 2 by RT PCR: NEGATIVE

## 2022-05-24 LAB — TROPONIN I (HIGH SENSITIVITY)
Troponin I (High Sensitivity): 5 ng/L (ref <18)
Troponin I (High Sensitivity): 5 ng/L (ref <18)

## 2022-05-24 MED ORDER — NICOTINE 21 MG/24HR TD PT24
21.0000 mg | MEDICATED_PATCH | Freq: Every day | TRANSDERMAL | Status: DC
  Administered 2022-05-24 – 2022-05-27 (×4): 21 mg via TRANSDERMAL
  Filled 2022-05-24 (×4): qty 1

## 2022-05-24 MED ORDER — ALUM & MAG HYDROXIDE-SIMETH 200-200-20 MG/5ML PO SUSP
15.0000 mL | ORAL | Status: DC | PRN
  Administered 2022-05-24 – 2022-05-25 (×3): 15 mL via ORAL
  Filled 2022-05-24 (×3): qty 30

## 2022-05-24 MED ORDER — PANTOPRAZOLE SODIUM 40 MG PO TBEC
40.0000 mg | DELAYED_RELEASE_TABLET | Freq: Two times a day (BID) | ORAL | Status: DC
  Administered 2022-05-24 – 2022-05-27 (×7): 40 mg via ORAL
  Filled 2022-05-24 (×7): qty 1

## 2022-05-24 MED ORDER — METHYLPREDNISOLONE SODIUM SUCC 40 MG IJ SOLR
40.0000 mg | Freq: Two times a day (BID) | INTRAMUSCULAR | Status: AC
  Administered 2022-05-24 (×2): 40 mg via INTRAVENOUS
  Filled 2022-05-24 (×2): qty 1

## 2022-05-24 MED ORDER — GUAIFENESIN ER 600 MG PO TB12
600.0000 mg | ORAL_TABLET | Freq: Two times a day (BID) | ORAL | Status: DC
  Administered 2022-05-24 – 2022-05-27 (×7): 600 mg via ORAL
  Filled 2022-05-24 (×7): qty 1

## 2022-05-24 MED ORDER — ACETAMINOPHEN 650 MG RE SUPP: 650.0000 mg | Freq: Four times a day (QID) | RECTAL | Status: DC | PRN

## 2022-05-24 MED ORDER — ONDANSETRON HCL 4 MG/2ML IJ SOLN: 4.0000 mg | Freq: Four times a day (QID) | INTRAMUSCULAR | Status: DC | PRN

## 2022-05-24 MED ORDER — ALBUTEROL SULFATE (2.5 MG/3ML) 0.083% IN NEBU
5.0000 mg | INHALATION_SOLUTION | Freq: Once | RESPIRATORY_TRACT | Status: AC
  Administered 2022-05-24: 5 mg via RESPIRATORY_TRACT
  Filled 2022-05-24: qty 6

## 2022-05-24 MED ORDER — OXYCODONE HCL 5 MG PO TABS
10.0000 mg | ORAL_TABLET | ORAL | Status: DC | PRN
  Administered 2022-05-24 – 2022-05-25 (×5): 10 mg via ORAL
  Filled 2022-05-24 (×5): qty 2

## 2022-05-24 MED ORDER — ACETAMINOPHEN 325 MG PO TABS
650.0000 mg | ORAL_TABLET | Freq: Four times a day (QID) | ORAL | Status: DC | PRN
  Administered 2022-05-24: 650 mg via ORAL
  Filled 2022-05-24: qty 2

## 2022-05-24 MED ORDER — ALBUTEROL SULFATE (2.5 MG/3ML) 0.083% IN NEBU
20.0000 mg/h | INHALATION_SOLUTION | Freq: Once | RESPIRATORY_TRACT | Status: AC
  Administered 2022-05-24: 20 mg/h via RESPIRATORY_TRACT
  Filled 2022-05-24: qty 24
  Filled 2022-05-24: qty 3

## 2022-05-24 MED ORDER — ORAL CARE MOUTH RINSE: 15.0000 mL | OROMUCOSAL | Status: DC | PRN

## 2022-05-24 MED ORDER — IPRATROPIUM-ALBUTEROL 0.5-2.5 (3) MG/3ML IN SOLN
3.0000 mL | Freq: Four times a day (QID) | RESPIRATORY_TRACT | Status: DC
  Administered 2022-05-24: 3 mL via RESPIRATORY_TRACT
  Filled 2022-05-24: qty 3

## 2022-05-24 MED ORDER — IPRATROPIUM BROMIDE 0.02 % IN SOLN
0.5000 mg | Freq: Once | RESPIRATORY_TRACT | Status: AC
  Administered 2022-05-24: 0.5 mg via RESPIRATORY_TRACT
  Filled 2022-05-24: qty 2.5

## 2022-05-24 MED ORDER — ONDANSETRON HCL 4 MG PO TABS: 4.0000 mg | ORAL_TABLET | Freq: Four times a day (QID) | ORAL | Status: DC | PRN

## 2022-05-24 MED ORDER — ASPIRIN 81 MG PO CHEW
324.0000 mg | CHEWABLE_TABLET | Freq: Once | ORAL | Status: AC
  Administered 2022-05-24: 324 mg via ORAL
  Filled 2022-05-24: qty 4

## 2022-05-24 MED ORDER — IPRATROPIUM-ALBUTEROL 0.5-2.5 (3) MG/3ML IN SOLN
3.0000 mL | RESPIRATORY_TRACT | Status: DC
  Administered 2022-05-24 – 2022-05-25 (×3): 3 mL via RESPIRATORY_TRACT
  Filled 2022-05-24 (×4): qty 3

## 2022-05-24 MED ORDER — MAGNESIUM SULFATE 2 GM/50ML IV SOLN
2.0000 g | Freq: Once | INTRAVENOUS | Status: AC
  Administered 2022-05-24: 2 g via INTRAVENOUS
  Filled 2022-05-24: qty 50

## 2022-05-24 MED ORDER — SODIUM CHLORIDE 0.9 % IV BOLUS
500.0000 mL | Freq: Once | INTRAVENOUS | Status: AC
  Administered 2022-05-24: 500 mL via INTRAVENOUS

## 2022-05-24 MED ORDER — LIP MEDEX EX OINT
TOPICAL_OINTMENT | CUTANEOUS | Status: DC | PRN
  Filled 2022-05-24: qty 7

## 2022-05-24 MED ORDER — PREDNISONE 20 MG PO TABS
40.0000 mg | ORAL_TABLET | Freq: Every day | ORAL | Status: DC
  Administered 2022-05-25 – 2022-05-27 (×3): 40 mg via ORAL
  Filled 2022-05-24 (×3): qty 2

## 2022-05-24 NOTE — ED Provider Notes (Signed)
[name] Provider Note   CSN: [id number] Arrival date & time: 05/24/22  0520     History  Chief Complaint  Patient presents with   Shortness of Breath    Gregory Christensen is a 71 y.o. male.  The history is provided by the patient.  Shortness of Breath He has history of emphysema, hypertension, diabetes, coronary artery disease, chronic kidney disease, pulmonary embolism was brought in by ambulance because difficulty breathing.  For last 4 days, he has been having shortness of breath and also frequent episodes of indigestion.  He has been taking frequent doses of antacids which do give him some temporary relief.  He has had a nonproductive cough.  Denies fever or chills but has had nausea and sweats.  At home, his albuterol inhaler was giving slight, temporary relief.  He also did take a dose of nitroglycerin tonight which also seem to give some slight relief.  In the ambulance, he was given albuterol with Atrovent via nebulizer, intravenous methylprednisolone.  He has noted some improvement with this.   Home Medications Prior to Admission medications   Medication Sig Start Date End Date Taking? Authorizing Provider  albuterol (PROVENTIL) (2.5 MG/3ML) 0.083% nebulizer solution Take 3 mLs (2.5 mg total) by nebulization every 6 (six) hours as needed for wheezing or shortness of breath. 10/31/21   Freddi Starr, MD  albuterol (VENTOLIN HFA) 108 (90 Base) MCG/ACT inhaler Inhale 2 puffs into the lungs every 4 (four) hours as needed for wheezing or shortness of breath. 10/23/21   Aline August, MD  ammonium lactate (LAC-HYDRIN) 12 % lotion Apply 1 Application topically as needed for dry skin.    [provider]  apixaban (ELIQUIS) 5 MG TABS tablet Take 1 tablet (5 mg total) by mouth 2 (two) times daily. 06/10/18   Regalado, Belkys A, MD  ascorbic acid (VITAMIN C) 500 MG tablet Take 500 mg by mouth in the morning and at bedtime. 05/10/21   [provider]  atorvastatin (LIPITOR) 80 MG tablet Take 80 mg by mouth at bedtime.    [provider]  azithromycin (ZITHROMAX) 250 MG tablet Take as directed Patient not taking: Reported on 05/22/2022 02/27/22   Freddi Starr, MD  buPROPion [facility] SR) 150 MG 12 hr tablet Take 150 mg by mouth 2 (two) times daily. 11/09/21   [provider]  diazepam (VALIUM) 5 MG tablet Take the day of MRI as needed Patient not taking: Reported on 05/22/2022 04/17/22   Wyatt Portela, MD  diclofenac Sodium (VOLTAREN) 1 % GEL Apply 2 g topically 2 (two) times daily as needed (Pain).    [provider]  docusate sodium (COLACE) 100 MG capsule Take 100 mg by mouth 2 (two) times daily.    [provider]  ezetimibe (ZETIA) 10 MG tablet Take 1 tablet (10 mg total) by mouth daily. 08/18/21   Chandrasekhar, Lyda Kalata A, MD  ferrous sulfate 325 (65 FE) MG tablet Take 1 tablet (325 mg total) by mouth daily. Patient taking differently: Take 325 mg by mouth every Monday, Wednesday, and Friday. 06/21/21 06/21/22  Shelly Coss, MD  fluticasone (FLONASE) 50 MCG/ACT nasal spray Place 1 spray into both nostrils as needed for allergies or rhinitis.    [provider]  fluticasone-salmeterol (WIXELA INHUB) 250-50 MCG/ACT AEPB Inhale 1 puff into the lungs in the morning and at bedtime.    [provider]  gabapentin (NEURONTIN) 300 MG capsule Take 300 mg by mouth  3 (three) times daily.    [provider]  levocetirizine (XYZAL) 5 MG tablet Take 5 mg by mouth daily as needed for allergies.    [provider]  lidocaine (LIDODERM) 5 % Place 1 patch onto the skin every 12 (twelve) hours as needed (pain).    [provider]  meclizine (ANTIVERT) 25 MG tablet Take 25 mg by mouth 3 (three) times daily as needed for dizziness.    [provider]  metoprolol succinate (TOPROL XL) 25 MG 24 hr tablet Take 1 tablet (25 mg total) by mouth at  bedtime. Patient not taking: Reported on 05/22/2022 10/31/21 11/01/22  Swinyer, Lanice Schwab, NP  montelukast (SINGULAIR) 10 MG tablet Take 10 mg by mouth at bedtime.    [provider]  naloxone [facility]) nasal spray 4 mg/0.1 mL Place 1 spray into the nose as needed (opioid overdose).    [provider]  nicotine (NICODERM CQ - DOSED IN MG/24 HOURS) 14 mg/24hr patch Place 14 mg onto the skin daily.    [provider]  nicotine (NICODERM CQ - DOSED IN MG/24 HOURS) 21 mg/24hr patch Place 1 patch (21 mg total) onto the skin daily. Patient not taking: Reported on 05/22/2022 10/23/21   Aline August, MD  nitroGLYCERIN (NITROSTAT) 0.4 MG SL tablet DISSOLVE ONE TABLET UNDER THE TONGUE EVERY 5 MINUTES AS NEEDED FOR CHEST PAIN.  DO NOT EXCEED A TOTAL OF 3 DOSES IN 15 MINUTES NOW 12/05/21   Rudean Haskell A, MD  oxyCODONE-acetaminophen (PERCOCET) 10-325 MG tablet Take 1 tablet by mouth every 6 (six) hours as needed for pain. 05/18/22   Wyatt Portela, MD  pantoprazole (PROTONIX) 40 MG tablet Take 1 tablet (40 mg total) by mouth 2 (two) times daily. 10/23/21 10/23/22  Aline August, MD  sildenafil (VIAGRA) 100 MG tablet Take 100 mg by mouth daily as needed for erectile dysfunction. 01/25/20   [provider]  tamsulosin (FLOMAX) 0.4 MG CAPS capsule Take 0.4 mg by mouth at bedtime.    [provider]  Tiotropium Bromide Monohydrate (SPIRIVA RESPIMAT) 2.5 MCG/ACT AERS Inhale 2 puffs into the lungs daily.    [provider]      Allergies    Patient has no known allergies.    Review of Systems   Review of Systems  Respiratory:  Positive for shortness of breath.   All other systems reviewed and are negative.   Physical Exam Updated Vital Signs BP 113/88 (BP Location: Right Arm)   Pulse 99   Temp 98.1 F (36.7 C) (Axillary)   Resp 20   Ht '6\' 1"'$  (1.854 m)   Wt 74.8 kg   SpO2 100%   BMI 21.77 kg/m  Physical Exam Vitals and nursing note reviewed.    71 year old male, in mild to moderate respiratory distress.  He is using accessory muscles of respiration, but is able to speak in complete sentences without stopping for breath.  Vital signs are normal. Oxygen saturation is 100%, which is normal. Head is normocephalic and atraumatic. PERRLA, EOMI. Oropharynx is clear. Neck is nontender and supple without adenopathy or JVD. Back is nontender and there is no CVA tenderness. Lungs have diminished airflow with diffuse inspiratory and expiratory wheezes. Chest is nontender. Heart has regular rate and rhythm without murmur. Abdomen is soft, flat, nontender. Extremities have no cyanosis or edema, full range of motion is present. Skin is warm and dry without rash. Neurologic: Mental status is normal, cranial nerves are intact,  moves all extremities equally.  ED Results / Procedures / Treatments   Labs (all labs ordered are listed, but only abnormal results are displayed) Labs Reviewed  BASIC METABOLIC PANEL - Abnormal; Notable for the following components:      Result Value   Glucose, Bld 100 (*)    Creatinine, Ser 1.48 (*)    GFR, Estimated 51 (*)    All other components within normal limits  CBC WITH DIFFERENTIAL/PLATELET - Abnormal; Notable for the following components:   Hemoglobin 12.7 (*)    All other components within normal limits  BLOOD GAS, ARTERIAL - Abnormal; Notable for the following components:   pO2, Arterial 166 (*)    All other components within normal limits  RESP PANEL BY RT-PCR (FLU A&B, COVID) ARPGX2  D-DIMER, QUANTITATIVE (NOT AT [facility])  TROPONIN I (HIGH SENSITIVITY)  TROPONIN I (HIGH SENSITIVITY)    EKG ED ECG REPORT   Date: 05/24/2022  Rate: 98  Rhythm: normal sinus rhythm  QRS Axis: left  Intervals: normal  ST/T Wave abnormalities: nonspecific ST/T changes  Conduction Disutrbances:left anterior fascicular block  Narrative Interpretation: Left atrial enlargement, nonspecific ST-T wave changes, left  anterior fascicular block.  When compared with ECG of 10/31/2021, T wave inversions in the anterior leads are new.  Old EKG Reviewed: changes noted  I have personally reviewed the EKG tracing and agree with the computerized printout as noted.  Radiology DG Chest Port 1 View  Result Date: 05/24/2022 CLINICAL DATA:  Shortness of breath.  History of COPD. EXAM: PORTABLE CHEST 1 VIEW COMPARISON:  03/26/2022 FINDINGS: Heart size and mediastinal contours are unremarkable. No pleural effusion or edema identified. Lungs are hyperinflated and there are advanced changes of emphysema. No airspace opacities. IMPRESSION: 1. Lungs are clear. 2. Hyperinflation and emphysema. Correlate for clinical signs or symptoms of COPD exacerbation. Electronically Signed   By: Kerby Moors M.D.   On: 05/24/2022 06:59    Procedures Procedures  Cardiac monitor shows sinus tachycardia, per my interpretation.  Medications Ordered in ED Medications  albuterol (PROVENTIL) (2.5 MG/3ML) 0.083% nebulizer solution 5 mg (has no administration in time range)  ipratropium (ATROVENT) nebulizer solution 0.5 mg (has no administration in time range)  magnesium sulfate IVPB 2 g 50 mL (0 g Intravenous Stopped 05/24/22 0721)  albuterol (PROVENTIL) (2.5 MG/3ML) 0.083% nebulizer solution (20 mg/hr Nebulization Given 05/24/22 0624)  ipratropium (ATROVENT) nebulizer solution 0.5 mg (0.5 mg Nebulization Given 05/24/22 0624)  aspirin chewable tablet 324 mg (324 mg Oral Given 05/24/22 0617)    ED Course/ Medical Decision Making/ A&P                           Medical Decision Making Amount and/or Complexity of Data Reviewed Labs: ordered. Radiology: ordered.  Risk OTC drugs. Prescription drug management.   Shortness of breath with wheezing which appears to be a COPD exacerbation.  However, chest discomfort with relief from nitroglycerin does raise possibility of either a superimposed ACS or ACS as the source of his breathing  problems.  Also, consider pneumonia, pulmonary embolism.  He received methylprednisolone in the ambulance, but not magnesium so I have ordered some intravenous magnesium.  Because of chest discomfort, I have also ordered a dose of oral aspirin.  I have ordered a continuous nebulizer treatment with albuterol and ipratropium.  Old records are reviewed, and he has numerous ED visits for COPD, as well as hospitalization with the most recent hospitalization in 08/18/2018.  He also had been admitted on 05/31/2018 with a pulmonary embolism.  Subsequent to that admission, he has had negative D-dimers.  I have ordered laboratory work-up of CBC, basic metabolic panel, troponin x2, D-dimer to rule out pulmonary embolism.  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  Following above-noted, patient states that he is feeling better.  However, he still using accessory muscles of respiration and still appears dyspneic at rest.  I have ordered an additional nebulizer treatment, but still feel that he is likely to need admission.  His chest x-ray shows hyperinflation but no infiltrate.  I have independently viewed the image, and agree with the radiologist's interpretation.  I have reviewed and interpreted his laboratory tests and my interpretation is mild renal insufficiency which is stable, mild anemia which is new.  Arterial blood gases shows no evidence of CO2 retention.  Case signed out to Dr. Tamera Punt to evaluate patient following nebulizer treatment.  CRITICAL CARE Performed by: Delora Fuel Total critical care time: 60 minutes Critical care time was exclusive of separately billable procedures and treating other patients. Critical care was necessary to treat or prevent imminent or life-threatening deterioration. Critical care was time spent personally by me on  the following activities: development of treatment plan with patient and/or surrogate as well as nursing, discussions with consultants, evaluation of patient's response to treatment, examination of patient, obtaining history from patient or surrogate, ordering and performing treatments and interventions, ordering and review of laboratory studies, ordering and review of radiographic studies, pulse oximetry and re-evaluation of patient's condition.  Final Clinical Impression(s) / ED Diagnoses Final diagnoses:  COPD exacerbation ([place])  Renal insufficiency  Normochromic normocytic anemia    Rx / DC Orders ED Discharge Orders     None         Delora Fuel, MD 83/66/29 0745

## 2022-05-24 NOTE — ED Triage Notes (Signed)
Pt BIBA from home for c/o chest pain feeling like indigestion x4 days, then started feeling SHOB. Audible wheezing, did not use rescue inhaler. Reports chest pain with breathing. Hx COPD. 20g LW  10 albuterol, .5 atrovent, '125mg'$  solumedrol. 2L [place] baseline  HR 100 107/80 CBG 99

## 2022-05-24 NOTE — H&P (Signed)
History and Physical    Patient: Gregory Christensen VQQ:[id number] DOB: 08-21-1950 DOA: 05/24/2022 DOS: the patient was seen and examined on 05/24/2022 PCP: [facility] [place]  Patient coming from: Home  Chief Complaint:  Chief Complaint  Patient presents with   Shortness of Breath   HPI: Gregory Christensen is a 71 y.o. male with medical history significant of COPD, tobacco abuse, HTN, CAD, DM2, PE/DVT, chronic hypoxic respiratory failure on 2L [place]. Presenting with shortness of breath. He reports that he's had increase indigestion over the last 4 days. He's tried pepto bismol and mylanta but they haven't helped. During this time he's also had an increase in shortness of breath. He has a non-productive cough. He denies fevers, N/V/D, or sick contacts. He tried his rescue inhalers but they haven't provided full relief. When his symptoms did not improve last night, he called for EMS. He denies any other aggravating or alleviating factors.     Review of Systems: As mentioned in the history of present illness. All other systems reviewed and are negative. Past Medical History:  Diagnosis Date   Acute deep vein thrombosis (DVT) of left lower extremity (HCC)    Acute hypoxemic respiratory failure (HCC)    Anemia    Iron deficiency   Anxiety    Aortic atherosclerosis (HCC)    Arthritis    Benign prostatic hyperplasia 08/28/2018   UNSPECIFIED WHETHER LOWER URINARY TRACT SYMPTOMS PRESENT   CAD (coronary artery disease)    Cancer (HCC)    Chronic back pain    Chronic hip pain    CKD stage 3a 03/19/2021   Constipation    due to Iron M-W-F   COPD exacerbation ([place]) 10/18/2017   Dyspnea    Emphysema lung ([place])    Essential hypertension    GERD (gastroesophageal reflux disease)    Gout    Gross hematuria 09/08/2018   Head injury, acute, with loss of consciousness ([place])    unsure how long he was unresponsive   History of blood transfusion 05/2019   History of kidney stones     History of tobacco abuse    Hypoxia    Insomnia    Oxygen dependent    2l- 24/7   Pneumonia    in 1980's   Pre-diabetes    Pulmonary embolism ([place]) 05/31/2018   Renal mass    CONCERNING FOR RENAL CELL CARCINOMA DR. Lovena Neighbours   Respiratory distress    Respiratory failure, acute-on-chronic ([place]) 08/03/2018   Seasonal allergies    Sleep apnea    Past Surgical History:  Procedure Laterality Date   BIOPSY  06/19/2021   Procedure: BIOPSY;  Surgeon: Wilford Corner, MD;  Location: [facility];  Service: Endoscopy;;   COLONOSCOPY WITH PROPOFOL N/A 09/27/2019   Procedure: COLONOSCOPY WITH PROPOFOL;  Surgeon: Irene Shipper, MD;  Location: [facility];  Service: Endoscopy;  Laterality: N/A;   CYSTOSCOPY/URETEROSCOPY/HOLMIUM LASER/STENT PLACEMENT Left 10/31/2018   Procedure: CYSTOSCOPY/URETEROSCOPY;  Surgeon: Ceasar Mons, MD;  Location: [facility];  Service: Urology;  Laterality: Left;   ESOPHAGOGASTRODUODENOSCOPY (EGD) WITH PROPOFOL N/A 09/27/2019   Procedure: ESOPHAGOGASTRODUODENOSCOPY (EGD) WITH PROPOFOL;  Surgeon: Irene Shipper, MD;  [facility];  Service: Endoscopy;  Laterality: N/A;   ESOPHAGOGASTRODUODENOSCOPY (EGD) WITH PROPOFOL N/A 06/19/2021   Procedure: ESOPHAGOGASTRODUODENOSCOPY (EGD) WITH PROPOFOL;  Surgeon: Wilford Corner, MD;  Location: [facility];  Service: Endoscopy;  Laterality: N/A;   GIVENS CAPSULE STUDY N/A 06/20/2021   Procedure: GIVENS CAPSULE STUDY;  Surgeon: Wilford Corner,  MD;  Location: [facility];  Service: Endoscopy;  Laterality: N/A;   HERNIA REPAIR Left 1977   inguinal   HYDROCELE EXCISION Left 05/13/2019   Procedure: HYDROCELECTOMY ADULT;  Surgeon: Ceasar Mons, MD;  Location: [facility];  Service: Urology;  Laterality: Left;   [place]   EXTENSIVE   POLYPECTOMY  09/27/2019   Procedure: POLYPECTOMY;  Surgeon: Irene Shipper, MD;  Location: [facility];  Service: Endoscopy;;   ROBOT ASSITED LAPAROSCOPIC  NEPHROURETERECTOMY Left 10/31/2018   Procedure: XI ROBOT ASSITED LAPAROSCOPIC NEPHROURETERECTOMY;  Surgeon: Ceasar Mons, MD;  Location: [facility];  Service: Urology;  Laterality: Left;  ONLY NEEDS 240 MIN FOR ALL PROCEDURES   SHOULDER ARTHROSCOPY WITH ROTATOR CUFF REPAIR Left 04/12/2020   Procedure: LEFT SHOULDER ARTHROSCOPY, DEBRIDEMENT, BICEPS TENODESIS, MINI OPEN ROTATOR CUFF TEAR REPAIR;  Surgeon: Meredith Pel, MD;  Location: [facility];  Service: Orthopedics;  Laterality: Left;   Social History:  reports that he has been smoking cigarettes. He has a 28.50 pack-year smoking history. He has never used smokeless tobacco. He reports current alcohol use of about 10.0 standard drinks of alcohol per week. He reports that he does not currently use drugs after having used the following drugs: Flunitrazepam.  No Known Allergies  Family History  Problem Relation Age of Onset   Diabetes Mother    Hypertension Sister    Hypertension Brother     Prior to Admission medications   Medication Sig Start Date End Date Taking? Authorizing Provider  albuterol (PROVENTIL) (2.5 MG/3ML) 0.083% nebulizer solution Take 3 mLs (2.5 mg total) by nebulization every 6 (six) hours as needed for wheezing or shortness of breath. 10/31/21   Freddi Starr, MD  albuterol (VENTOLIN HFA) 108 (90 Base) MCG/ACT inhaler Inhale 2 puffs into the lungs every 4 (four) hours as needed for wheezing or shortness of breath. 10/23/21   Aline August, MD  ammonium lactate (LAC-HYDRIN) 12 % lotion Apply 1 Application topically as needed for dry skin.    [provider]  apixaban (ELIQUIS) 5 MG TABS tablet Take 1 tablet (5 mg total) by mouth 2 (two) times daily. 06/10/18   Regalado, Belkys A, MD  ascorbic acid (VITAMIN C) 500 MG tablet Take 500 mg by mouth in the morning and at bedtime. 05/10/21   [provider]  atorvastatin (LIPITOR) 80 MG tablet Take 80 mg by mouth at bedtime.    [provider]   azithromycin (ZITHROMAX) 250 MG tablet Take as directed Patient not taking: Reported on 05/22/2022 02/27/22   Freddi Starr, MD  buPROPion [facility] SR) 150 MG 12 hr tablet Take 150 mg by mouth 2 (two) times daily. 11/09/21   [provider]  diazepam (VALIUM) 5 MG tablet Take the day of MRI as needed Patient not taking: Reported on 05/22/2022 04/17/22   Wyatt Portela, MD  diclofenac Sodium (VOLTAREN) 1 % GEL Apply 2 g topically 2 (two) times daily as needed (Pain).    [provider]  docusate sodium (COLACE) 100 MG capsule Take 100 mg by mouth 2 (two) times daily.    [provider]  ezetimibe (ZETIA) 10 MG tablet Take 1 tablet (10 mg total) by mouth daily. 08/18/21   Chandrasekhar, Lyda Kalata A, MD  ferrous sulfate 325 (65 FE) MG tablet Take 1 tablet (325 mg total) by mouth daily. Patient taking differently: Take 325 mg by mouth every Monday, Wednesday, and Friday. 06/21/21 06/21/22  Shelly Coss, MD  fluticasone (  FLONASE) 50 MCG/ACT nasal spray Place 1 spray into both nostrils as needed for allergies or rhinitis.    [provider]  fluticasone-salmeterol (WIXELA INHUB) 250-50 MCG/ACT AEPB Inhale 1 puff into the lungs in the morning and at bedtime.    [provider]  gabapentin (NEURONTIN) 300 MG capsule Take 300 mg by mouth 3 (three) times daily.    [provider]  levocetirizine (XYZAL) 5 MG tablet Take 5 mg by mouth daily as needed for allergies.    [provider]  lidocaine (LIDODERM) 5 % Place 1 patch onto the skin every 12 (twelve) hours as needed (pain).    [provider]  meclizine (ANTIVERT) 25 MG tablet Take 25 mg by mouth 3 (three) times daily as needed for dizziness.    [provider]  metoprolol succinate (TOPROL XL) 25 MG 24 hr tablet Take 1 tablet (25 mg total) by mouth at bedtime. Patient not taking: Reported on 05/22/2022 10/31/21 11/01/22  Swinyer, Lanice Schwab, NP  montelukast (SINGULAIR)  10 MG tablet Take 10 mg by mouth at bedtime.    [provider]  naloxone [facility]) nasal spray 4 mg/0.1 mL Place 1 spray into the nose as needed (opioid overdose).    [provider]  nicotine (NICODERM CQ - DOSED IN MG/24 HOURS) 14 mg/24hr patch Place 14 mg onto the skin daily.    [provider]  nicotine (NICODERM CQ - DOSED IN MG/24 HOURS) 21 mg/24hr patch Place 1 patch (21 mg total) onto the skin daily. Patient not taking: Reported on 05/22/2022 10/23/21   Aline August, MD  nitroGLYCERIN (NITROSTAT) 0.4 MG SL tablet DISSOLVE ONE TABLET UNDER THE TONGUE EVERY 5 MINUTES AS NEEDED FOR CHEST PAIN.  DO NOT EXCEED A TOTAL OF 3 DOSES IN 15 MINUTES NOW 12/05/21   Rudean Haskell A, MD  oxyCODONE-acetaminophen (PERCOCET) 10-325 MG tablet Take 1 tablet by mouth every 6 (six) hours as needed for pain. 05/18/22   Wyatt Portela, MD  pantoprazole (PROTONIX) 40 MG tablet Take 1 tablet (40 mg total) by mouth 2 (two) times daily. 10/23/21 10/23/22  Aline August, MD  sildenafil (VIAGRA) 100 MG tablet Take 100 mg by mouth daily as needed for erectile dysfunction. 01/25/20   [provider]  tamsulosin (FLOMAX) 0.4 MG CAPS capsule Take 0.4 mg by mouth at bedtime.    [provider]  Tiotropium Bromide Monohydrate (SPIRIVA RESPIMAT) 2.5 MCG/ACT AERS Inhale 2 puffs into the lungs daily.    [provider]    Physical Exam: Vitals:   05/24/22 0533 05/24/22 0645 05/24/22 0715 05/24/22 0830  BP: 113/88 114/87 111/83 98/80  Pulse: 99 97 96 (!) 107  Resp: 20 17 (!) 22 (!) 24  Temp: 98.1 F (36.7 C)     TempSrc: Axillary     SpO2: 100% 100% 100% 97%  Weight: 74.8 kg     Height: '6\' 1"'$  (1.854 m)      General: 71 y.o. male resting in bed in NAD Eyes: PERRL, normal sclera ENMT: Nares patent w/o discharge, orophaynx clear, dentition normal, ears w/o discharge/lesions/ulcers Neck: Supple, trachea midline Cardiovascular: tachy, +S1, S2, no m/g/r, equal  pulses throughout Respiratory: diffuse expiratory wheeze, increased WOB while on nebs GI: BS+, NDNT, no masses noted, no organomegaly noted MSK: No e/c/c Neuro: A&O x 3, no focal deficits Psyc: Appropriate interaction and affect, calm/cooperative  Data Reviewed:  Results for orders placed or performed during the hospital encounter of 05/24/22 (from the past  24 hour(s))  Basic metabolic panel     Status: Abnormal   Collection Time: 05/24/22  5:52 AM  Result Value Ref Range   Sodium 136 135 - 145 mmol/L   Potassium 4.0 3.5 - 5.1 mmol/L   Chloride 103 98 - 111 mmol/L   CO2 26 22 - 32 mmol/L   Glucose, Bld 100 (H) 70 - 99 mg/dL   BUN 12 8 - 23 mg/dL   Creatinine, Ser 1.48 (H) 0.61 - 1.24 mg/dL   Calcium 9.5 8.9 - 10.3 mg/dL   GFR, Estimated 51 (L) >60 mL/min   Anion gap 7 5 - 15  Troponin I (High Sensitivity)     Status: None   Collection Time: 05/24/22  5:52 AM  Result Value Ref Range   Troponin I (High Sensitivity) 5 <18 ng/L  CBC with Differential     Status: Abnormal   Collection Time: 05/24/22  5:52 AM  Result Value Ref Range   WBC 6.0 4.0 - 10.5 K/uL   RBC 4.55 4.22 - 5.81 MIL/uL   Hemoglobin 12.7 (L) 13.0 - 17.0 g/dL   HCT 41.5 39.0 - 52.0 %   MCV 91.2 80.0 - 100.0 fL   MCH 27.9 26.0 - 34.0 pg   MCHC 30.6 30.0 - 36.0 g/dL   RDW 14.8 11.5 - 15.5 %   Platelets 242 150 - 400 K/uL   nRBC 0.0 0.0 - 0.2 %   Neutrophils Relative % 60 %   Neutro Abs 3.6 1.7 - 7.7 K/uL   Lymphocytes Relative 27 %   Lymphs Abs 1.6 0.7 - 4.0 K/uL   Monocytes Relative 12 %   Monocytes Absolute 0.7 0.1 - 1.0 K/uL   Eosinophils Relative 1 %   Eosinophils Absolute 0.1 0.0 - 0.5 K/uL   Basophils Relative 0 %   Basophils Absolute 0.0 0.0 - 0.1 K/uL   Immature Granulocytes 0 %   Abs Immature Granulocytes 0.00 0.00 - 0.07 K/uL  D-dimer, quantitative     Status: None   Collection Time: 05/24/22  6:05 AM  Result Value Ref Range   D-Dimer, Quant 0.41 0.00 - 0.50 ug/mL-FEU  Blood gas, arterial      Status: Abnormal   Collection Time: 05/24/22  6:30 AM  Result Value Ref Range   O2 Content 2.0 L/min   Delivery systems NASAL CANNULA    pH, Arterial 7.4 7.35 - 7.45   pCO2 arterial 40 32 - 48 mmHg   pO2, Arterial 166 (H) 83 - 108 mmHg   Bicarbonate 24.8 20.0 - 28.0 mmol/L   Acid-Base Excess 0.0 0.0 - 2.0 mmol/L   O2 Saturation 99 %   Patient temperature 36.7    Collection site LEFT RADIAL    Drawn by [id number]    Allens test (pass/fail) PASS PASS  Troponin I (High Sensitivity)     Status: None   Collection Time: 05/24/22  8:09 AM  Result Value Ref Range   Troponin I (High Sensitivity) 5 <18 ng/L   CXR: 1. Lungs are clear. 2. Hyperinflation and emphysema. Correlate for clinical signs or symptoms of COPD exacerbation.  Assessment and Plan: COPD exacerbation Acute on chronic hypoxic respiratory failure     - place in ob, tele     - chronically on 2L [place]     - CXR is negative     - COVID/flu pending, but normal white count and no fever     - continue steroids, nebs; add guaifenesin  GERD Dyspepsia     -  protonix  CKD 3a     - at baseline, follow  Tobacco abuse     - counsel against further use     - nicotine patch  Hx of PE/DVT     - continue home anticoagulation when confirmed  HLD CAD     - continue home regimen when confirmed  HTN     - continue home regimen when confirmed  Urothelial carcinoma w/ spinal mets     - continue home regimen when confirmed     - continue outpt follow w/ Dr. Alen Blew  DM2     - diet controlled     - carb controlled diet; daily glucose checks  BPH     - continue home regimen when confirmed  Advance Care Planning:   Code Status: FULL  Consults: None  Family Communication: None at bedside  Severity of Illness: The appropriate patient status for this patient is OBSERVATION. Observation status is judged to be reasonable and necessary in order to provide the required intensity of service to ensure the patient's safety. The  patient's presenting symptoms, physical exam findings, and initial radiographic and laboratory data in the context of their medical condition is felt to place them at decreased risk for further clinical deterioration. Furthermore, it is anticipated that the patient will be medically stable for discharge from the hospital within 2 midnights of admission.   Author: Jonnie Finner, DO 05/24/2022 8:59 AM  For on call review [email or web address].

## 2022-05-24 NOTE — Progress Notes (Addendum)
Patient was rude to this RN when this RN came to answer for his call light. He used inappropriate word and RN had to have a conversation to patient. Patient talked to this RN unrespectful with loud voice that he did not care how busy this unit is and still cussed out.  This RN gave patient PRN medication for pain, PRN medication for heart burn and assess patient for his chest pain complained.

## 2022-05-24 NOTE — ED Provider Notes (Signed)
Care was taken over from Dr. Roxanne Mins.  Patient is still having some wheezing and increased work of breathing with diminished breath sounds following multiple nebulizer treatments in the ED.  He is also being given magnesium and Solu-Medrol.  Chest x-ray does not illustrate any signs of pneumonia.  Will plan admission.  I discussed with Dr. Marylyn Ishihara who will admit the patient for further treatment.   Malvin Johns, MD 05/24/22 [phone]

## 2022-05-25 ENCOUNTER — Ambulatory Visit (HOSPITAL_COMMUNITY)
Admission: RE | Admit: 2022-05-25 | Discharge: 2022-05-25 | Disposition: A | Discharge status: Home / Self Care | Payer: Medicare Other | Source: Ambulatory Visit | Attending: Oncology | Admitting: Oncology

## 2022-05-25 ENCOUNTER — Observation Stay (HOSPITAL_COMMUNITY): Payer: Medicare Other

## 2022-05-25 DIAGNOSIS — R1084 Generalized abdominal pain: Secondary | ICD-10-CM | POA: Diagnosis not present

## 2022-05-25 DIAGNOSIS — I10 Essential (primary) hypertension: Secondary | ICD-10-CM | POA: Diagnosis not present

## 2022-05-25 DIAGNOSIS — N179 Acute kidney failure, unspecified: Secondary | ICD-10-CM | POA: Diagnosis not present

## 2022-05-25 DIAGNOSIS — E875 Hyperkalemia: Secondary | ICD-10-CM | POA: Diagnosis not present

## 2022-05-25 DIAGNOSIS — I2782 Chronic pulmonary embolism: Secondary | ICD-10-CM

## 2022-05-25 DIAGNOSIS — J441 Chronic obstructive pulmonary disease with (acute) exacerbation: Secondary | ICD-10-CM | POA: Diagnosis not present

## 2022-05-25 DIAGNOSIS — K219 Gastro-esophageal reflux disease without esophagitis: Secondary | ICD-10-CM

## 2022-05-25 DIAGNOSIS — C652 Malignant neoplasm of left renal pelvis: Secondary | ICD-10-CM | POA: Diagnosis present

## 2022-05-25 DIAGNOSIS — R410 Disorientation, unspecified: Secondary | ICD-10-CM | POA: Diagnosis not present

## 2022-05-25 DIAGNOSIS — E871 Hypo-osmolality and hyponatremia: Secondary | ICD-10-CM | POA: Diagnosis not present

## 2022-05-25 DIAGNOSIS — E1169 Type 2 diabetes mellitus with other specified complication: Secondary | ICD-10-CM

## 2022-05-25 DIAGNOSIS — C689 Malignant neoplasm of urinary organ, unspecified: Secondary | ICD-10-CM

## 2022-05-25 DIAGNOSIS — N401 Enlarged prostate with lower urinary tract symptoms: Secondary | ICD-10-CM | POA: Diagnosis present

## 2022-05-25 DIAGNOSIS — G8929 Other chronic pain: Secondary | ICD-10-CM | POA: Diagnosis present

## 2022-05-25 DIAGNOSIS — C7951 Secondary malignant neoplasm of bone: Secondary | ICD-10-CM | POA: Diagnosis present

## 2022-05-25 DIAGNOSIS — Z9981 Dependence on supplemental oxygen: Secondary | ICD-10-CM | POA: Diagnosis not present

## 2022-05-25 DIAGNOSIS — J439 Emphysema, unspecified: Secondary | ICD-10-CM | POA: Diagnosis present

## 2022-05-25 DIAGNOSIS — I700 Atherosclerosis of aorta: Secondary | ICD-10-CM | POA: Diagnosis present

## 2022-05-25 DIAGNOSIS — N138 Other obstructive and reflux uropathy: Secondary | ICD-10-CM | POA: Diagnosis not present

## 2022-05-25 DIAGNOSIS — Z7901 Long term (current) use of anticoagulants: Secondary | ICD-10-CM | POA: Diagnosis not present

## 2022-05-25 DIAGNOSIS — E785 Hyperlipidemia, unspecified: Secondary | ICD-10-CM | POA: Diagnosis present

## 2022-05-25 DIAGNOSIS — N1831 Chronic kidney disease, stage 3a: Secondary | ICD-10-CM | POA: Diagnosis not present

## 2022-05-25 DIAGNOSIS — F17210 Nicotine dependence, cigarettes, uncomplicated: Secondary | ICD-10-CM | POA: Diagnosis present

## 2022-05-25 DIAGNOSIS — J9621 Acute and chronic respiratory failure with hypoxia: Secondary | ICD-10-CM | POA: Diagnosis present

## 2022-05-25 DIAGNOSIS — G4700 Insomnia, unspecified: Secondary | ICD-10-CM | POA: Diagnosis present

## 2022-05-25 DIAGNOSIS — I129 Hypertensive chronic kidney disease with stage 1 through stage 4 chronic kidney disease, or unspecified chronic kidney disease: Secondary | ICD-10-CM | POA: Diagnosis present

## 2022-05-25 DIAGNOSIS — Z20822 Contact with and (suspected) exposure to covid-19: Secondary | ICD-10-CM | POA: Diagnosis present

## 2022-05-25 DIAGNOSIS — D649 Anemia, unspecified: Secondary | ICD-10-CM | POA: Diagnosis present

## 2022-05-25 DIAGNOSIS — N289 Disorder of kidney and ureter, unspecified: Secondary | ICD-10-CM | POA: Diagnosis present

## 2022-05-25 DIAGNOSIS — E1122 Type 2 diabetes mellitus with diabetic chronic kidney disease: Secondary | ICD-10-CM | POA: Diagnosis present

## 2022-05-25 DIAGNOSIS — G4730 Sleep apnea, unspecified: Secondary | ICD-10-CM | POA: Diagnosis present

## 2022-05-25 DIAGNOSIS — J302 Other seasonal allergic rhinitis: Secondary | ICD-10-CM | POA: Diagnosis present

## 2022-05-25 DIAGNOSIS — I2510 Atherosclerotic heart disease of native coronary artery without angina pectoris: Secondary | ICD-10-CM | POA: Diagnosis present

## 2022-05-25 LAB — CBC
HCT: 41.3 % (ref 39.0–52.0)
Hemoglobin: 13.1 g/dL (ref 13.0–17.0)
MCH: 28.4 pg (ref 26.0–34.0)
MCHC: 31.7 g/dL (ref 30.0–36.0)
MCV: 89.6 fL (ref 80.0–100.0)
Platelets: 287 10*3/uL (ref 150–400)
RBC: 4.61 MIL/uL (ref 4.22–5.81)
RDW: 14.6 % (ref 11.5–15.5)
WBC: 12.3 10*3/uL — ABNORMAL HIGH (ref 4.0–10.5)
nRBC: 0 % (ref 0.0–0.2)

## 2022-05-25 LAB — BLOOD GAS, ARTERIAL
Acid-Base Excess: 1 mmol/L (ref 0.0–2.0)
Bicarbonate: 25.2 mmol/L (ref 20.0–28.0)
O2 Saturation: 99.9 %
Patient temperature: 37
pCO2 arterial: 38 mmHg (ref 32–48)
pH, Arterial: 7.43 (ref 7.35–7.45)
pO2, Arterial: 211 mmHg — ABNORMAL HIGH (ref 83–108)

## 2022-05-25 LAB — HEMOGLOBIN A1C
Hgb A1c MFr Bld: 6.1 % — ABNORMAL HIGH (ref 4.8–5.6)
Mean Plasma Glucose: 128 mg/dL

## 2022-05-25 LAB — COMPREHENSIVE METABOLIC PANEL
ALT: 18 U/L (ref 0–44)
AST: 34 U/L (ref 15–41)
Albumin: 4.4 g/dL (ref 3.5–5.0)
Alkaline Phosphatase: 90 U/L (ref 38–126)
Anion gap: 12 (ref 5–15)
BUN: 19 mg/dL (ref 8–23)
CO2: 21 mmol/L — ABNORMAL LOW (ref 22–32)
Calcium: 9.5 mg/dL (ref 8.9–10.3)
Chloride: 98 mmol/L (ref 98–111)
Creatinine, Ser: 1.44 mg/dL — ABNORMAL HIGH (ref 0.61–1.24)
GFR, Estimated: 52 mL/min — ABNORMAL LOW (ref >60)
Glucose, Bld: 128 mg/dL — ABNORMAL HIGH (ref 70–99)
Potassium: 5.2 mmol/L — ABNORMAL HIGH (ref 3.5–5.1)
Sodium: 131 mmol/L — ABNORMAL LOW (ref 135–145)
Total Bilirubin: 0.5 mg/dL (ref 0.3–1.2)
Total Protein: 8.2 g/dL — ABNORMAL HIGH (ref 6.5–8.1)

## 2022-05-25 MED ORDER — FLUTICASONE FUROATE-VILANTEROL 200-25 MCG/ACT IN AEPB
1.0000 | INHALATION_SPRAY | Freq: Every day | RESPIRATORY_TRACT | Status: DC
  Administered 2022-05-25 – 2022-05-27 (×3): 1 via RESPIRATORY_TRACT
  Filled 2022-05-25: qty 28

## 2022-05-25 MED ORDER — FINASTERIDE 5 MG PO TABS
5.0000 mg | ORAL_TABLET | Freq: Every day | ORAL | Status: DC
  Administered 2022-05-25 – 2022-05-27 (×3): 5 mg via ORAL
  Filled 2022-05-25 (×3): qty 1

## 2022-05-25 MED ORDER — LORAZEPAM 2 MG/ML IJ SOLN
0.5000 mg | INTRAMUSCULAR | Status: DC
  Filled 2022-05-25: qty 1

## 2022-05-25 MED ORDER — GABAPENTIN 300 MG PO CAPS
300.0000 mg | ORAL_CAPSULE | Freq: Every day | ORAL | Status: DC
  Administered 2022-05-25 – 2022-05-26 (×2): 300 mg via ORAL
  Filled 2022-05-25 (×2): qty 1

## 2022-05-25 MED ORDER — BUPROPION HCL ER (SR) 150 MG PO TB12
150.0000 mg | ORAL_TABLET | Freq: Two times a day (BID) | ORAL | Status: DC
  Administered 2022-05-25 – 2022-05-27 (×5): 150 mg via ORAL
  Filled 2022-05-25 (×5): qty 1

## 2022-05-25 MED ORDER — MONTELUKAST SODIUM 10 MG PO TABS
10.0000 mg | ORAL_TABLET | Freq: Every day | ORAL | Status: DC
  Administered 2022-05-25 – 2022-05-26 (×2): 10 mg via ORAL
  Filled 2022-05-25 (×2): qty 1

## 2022-05-25 MED ORDER — GABAPENTIN 300 MG PO CAPS: 300.0000 mg | ORAL_CAPSULE | ORAL | Status: DC

## 2022-05-25 MED ORDER — LORAZEPAM 2 MG/ML IJ SOLN
0.5000 mg | INTRAMUSCULAR | Status: AC
  Administered 2022-05-25: 0.5 mg via INTRAMUSCULAR
  Filled 2022-05-25: qty 1

## 2022-05-25 MED ORDER — ALBUTEROL SULFATE (2.5 MG/3ML) 0.083% IN NEBU
2.5000 mg | INHALATION_SOLUTION | Freq: Four times a day (QID) | RESPIRATORY_TRACT | Status: DC
  Administered 2022-05-26 – 2022-05-27 (×7): 2.5 mg via RESPIRATORY_TRACT
  Filled 2022-05-25 (×7): qty 3

## 2022-05-25 MED ORDER — FERROUS SULFATE 325 (65 FE) MG PO TABS
325.0000 mg | ORAL_TABLET | ORAL | Status: DC
  Administered 2022-05-25: 325 mg via ORAL
  Filled 2022-05-25: qty 1

## 2022-05-25 MED ORDER — ATORVASTATIN CALCIUM 40 MG PO TABS
80.0000 mg | ORAL_TABLET | Freq: Every day | ORAL | Status: DC
  Administered 2022-05-25: 80 mg via ORAL
  Filled 2022-05-25: qty 2

## 2022-05-25 MED ORDER — EZETIMIBE 10 MG PO TABS
10.0000 mg | ORAL_TABLET | Freq: Every day | ORAL | Status: DC
  Administered 2022-05-25 – 2022-05-27 (×3): 10 mg via ORAL
  Filled 2022-05-25 (×3): qty 1

## 2022-05-25 MED ORDER — REVEFENACIN 175 MCG/3ML IN SOLN
175.0000 ug | Freq: Every day | RESPIRATORY_TRACT | Status: DC
  Administered 2022-05-25 – 2022-05-27 (×3): 175 ug via RESPIRATORY_TRACT
  Filled 2022-05-25 (×3): qty 3

## 2022-05-25 MED ORDER — METOPROLOL SUCCINATE ER 25 MG PO TB24
25.0000 mg | ORAL_TABLET | Freq: Every day | ORAL | Status: DC
  Administered 2022-05-25 – 2022-05-26 (×2): 25 mg via ORAL
  Filled 2022-05-25 (×2): qty 1

## 2022-05-25 MED ORDER — FLUTICASONE PROPIONATE 50 MCG/ACT NA SUSP
1.0000 | Freq: Two times a day (BID) | NASAL | Status: DC
  Administered 2022-05-25 – 2022-05-27 (×5): 1 via NASAL
  Filled 2022-05-25: qty 16

## 2022-05-25 MED ORDER — GABAPENTIN 300 MG PO CAPS
600.0000 mg | ORAL_CAPSULE | Freq: Every day | ORAL | Status: DC
  Administered 2022-05-25: 600 mg via ORAL
  Filled 2022-05-25: qty 2

## 2022-05-25 MED ORDER — APIXABAN 5 MG PO TABS
5.0000 mg | ORAL_TABLET | Freq: Two times a day (BID) | ORAL | Status: DC
  Administered 2022-05-25 – 2022-05-27 (×5): 5 mg via ORAL
  Filled 2022-05-25 (×5): qty 1

## 2022-05-25 MED ORDER — LEVOCETIRIZINE DIHYDROCHLORIDE 5 MG PO TABS: 5.0000 mg | ORAL_TABLET | Freq: Every day | ORAL | Status: DC | PRN

## 2022-05-25 MED ORDER — IPRATROPIUM-ALBUTEROL 0.5-2.5 (3) MG/3ML IN SOLN
3.0000 mL | RESPIRATORY_TRACT | Status: DC | PRN
  Administered 2022-05-25 (×2): 3 mL via RESPIRATORY_TRACT
  Filled 2022-05-25 (×2): qty 3

## 2022-05-25 MED ORDER — TAMSULOSIN HCL 0.4 MG PO CAPS
0.8000 mg | ORAL_CAPSULE | Freq: Once | ORAL | Status: AC
  Administered 2022-05-25: 0.8 mg via ORAL
  Filled 2022-05-25 (×2): qty 2

## 2022-05-25 MED ORDER — TAMSULOSIN HCL 0.4 MG PO CAPS
0.4000 mg | ORAL_CAPSULE | Freq: Every day | ORAL | Status: DC
  Administered 2022-05-26: 0.4 mg via ORAL
  Filled 2022-05-25: qty 1

## 2022-05-25 MED ORDER — LORATADINE 10 MG PO TABS: 10.0000 mg | ORAL_TABLET | Freq: Every day | ORAL | Status: DC | PRN

## 2022-05-25 NOTE — Progress Notes (Signed)
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  He was started on systemic steroid, scheduled and as needed nebulizers.  The next day, patient had acute urinary retention and bladder over distention.  Foley catheter placed with removal of 1.1 L urine right away.  Subjective: Seen and examined earlier this morning.  Very uncomfortable due to difficulty urinating and "constipation".  He was asking for enema.  Continues to endorse shortness of breath, cough and wheeze.  Also reports abdominal pain and severe back pain.  He is sitting on bedside commode.  Objective: Vitals:   05/24/22 1458 05/24/22 1820 05/24/22 1900 05/25/22 0300  BP:  110/71 101/71 (!) 131/103  Pulse:  87 92 (!) 105  Resp:  '20 20 20  '$ Temp:  98 F (36.7 C) 97.7 F (36.5 C) 97.8 F (36.6 C)  TempSrc:  Oral Oral Oral  SpO2: 100% 100% 98% 98%  Weight:      Height:        Examination:  GENERAL: No apparent distress.  Nontoxic. HEENT: MMM.  Vision and hearing grossly intact.  NECK: Supple.  No apparent JVD.  RESP:  No IWOB.  Fair aeration bilaterally. CVS:  RRR. Heart sounds normal.  ABD/GI/GU: BS+.  Abdomen somewhat distended and tender.  Enlarged prostate on rectal exam MSK/EXT:  Moves extremities. No apparent deformity. No edema.  SKIN: no apparent skin lesion or wound NEURO: Awake, alert and oriented appropriately.  No apparent focal neuro deficit. PSYCH: Calm. Normal affect.    RN, Melissa house present as chaperone during rectal exam.  Procedures:  None  Microbiology summarized: [id number] and influenza PCR nonreactive.  Assessment and plan: Principal Problem:   COPD exacerbation ([place]) Active Problems:   Acute on chronic respiratory failure with hypoxia (HCC)   Pulmonary embolism (HCC)   Chronic anticoagulation   DM2 (diabetes mellitus, type 2) (HCC)   Hyperlipidemia   CKD stage 3a   Essential hypertension   GERD (gastroesophageal reflux disease)   Dyspepsia   Urothelial carcinoma (HCC)  COPD exacerbation/chronic respiratory failure with hypoxia: COVID-19 and influenza PCR nonreactive.  CXR with hyperinflation.  Still with significant symptoms including shortness of breath and cough. -Continue systemic steroid -Start Breo Ellipta and Yupelri -Change DuoNebs to as needed -Ambulatory saturation, PT/OT eval -Encourage smoking cessation.  Nicotine patch  BPH with acute urinary retention-likely from holding his BPH meds.  Had 1.1 L urine right after inserting Foley.  Rectal exam with enlarged prostate.  Low suspicion for prostatitis. -Give Flomax 0.8 mg x 1 followed by 0.4 mg daily -Resume home Proscar.   GERD -Protonix and GI cocktail   CKD-3A: Renal function better than baseline. Recent Labs    06/20/21 0606 10/18/21 1253 10/20/21 0514 10/21/21 0628 10/22/21 0508 10/23/21 0433 03/26/22 0940 03/26/22 0956 05/24/22 0552 05/25/22 0506  BUN '11 10 16 22 '$ 26* 32* '14 12 12 19  '$ CREATININE 1.66* 1.51* 1.49* 1.74* 1.80* 1.78* 1.75* 1.70* 1.48* 1.44*  -Continue monitoring  Tobacco abuse: Reports smoking about half a pack a day. -Encourage cessation.  Continue nicotine patch.   Hx of PE/DVT -Continue home Eliquis.   History of CAD/HLD: No chest pain. -Continue home regimen   HTN -Cardiac meds as above   Urothelial carcinoma involving the left renal pelvis s/p left nephroureterectomy -Metastatic spinal lesion on T11/L1. -Followed by  Dr. Lovena Neighbours and Dr. Alen Blew   Diet controlled DM-2: A1c 6.1%. -Monitor glucose with daily labs  Mild hyponatremia/hyperkalemia -Monitor  Body mass index is 21.77 kg/m.          DVT prophylaxis:   apixaban (ELIQUIS) tablet 5 mg  Code Status: Full code Family Communication: None at bedside Level of care: Telemetry Status is: Observation The patient will require care spanning > 2 midnights and should be moved to inpatient because: COPD exacerbation, electrolyte derangement and acute urinary retention   Final disposition: Likely home once medically stable Consultants:  None  Sch Meds:  Scheduled Meds:  apixaban  5 mg Oral BID   atorvastatin  80 mg Oral QHS   buPROPion  150 mg Oral BID   ezetimibe  10 mg Oral Daily   ferrous sulfate  325 mg Oral Q M,W,F   finasteride  5 mg Oral Daily   fluticasone furoate-vilanterol  1 puff Inhalation Daily   gabapentin  300 mg Oral Daily   And   gabapentin  600 mg Oral QHS   guaiFENesin  600 mg Oral BID   metoprolol succinate  25 mg Oral QHS   montelukast  10 mg Oral QHS   nicotine  21 mg Transdermal Daily   pantoprazole  40 mg Oral BID   predniSONE  40 mg Oral Q breakfast   revefenacin  175 mcg Nebulization Daily   tamsulosin  0.8 mg Oral Once   Followed by   [START ON 05/26/2022] tamsulosin  0.4 mg Oral QPC supper   Continuous Infusions: PRN Meds:.acetaminophen **OR** acetaminophen, alum & mag hydroxide-simeth, ipratropium-albuterol, lip balm, loratadine, ondansetron **OR** ondansetron (ZOFRAN) IV, mouth rinse, oxyCODONE  Antimicrobials: Anti-infectives (From admission, onward)    None        I have personally reviewed the following labs and images: CBC: Recent Labs  Lab 05/24/22 0552 05/25/22 0506  WBC 6.0 12.3*  NEUTROABS 3.6  --   HGB 12.7* 13.1  HCT 41.5 41.3  MCV 91.2 89.6  PLT 242 287   BMP &GFR Recent Labs  Lab 05/24/22 0552 05/25/22 0506  NA 136 131*  K 4.0 5.2*  CL 103 98  CO2 26 21*  GLUCOSE  100* 128*  BUN 12 19  CREATININE 1.48* 1.44*  CALCIUM 9.5 9.5   Estimated Creatinine Clearance: 50.5 mL/min (A) (by C-G formula based on SCr of 1.44 mg/dL (H)). Liver & Pancreas: Recent Labs  Lab 05/25/22 0506  AST 34  ALT 18  ALKPHOS 90  BILITOT 0.5  PROT 8.2*  ALBUMIN 4.4   No results for input(s): "LIPASE", "AMYLASE" in the last 168 hours. No results for input(s): "AMMONIA" in the last 168 hours. Diabetic: Recent Labs    05/24/22 1423  HGBA1C 6.1*   No results for input(s): "GLUCAP" in the last 168 hours. Cardiac Enzymes: No results for input(s): "CKTOTAL", "CKMB", "CKMBINDEX", "TROPONINI" in the last 168 hours. No results for input(s): "PROBNP" in the last 8760 hours. Coagulation Profile: No results for input(s): "INR", "PROTIME" in the last 168 hours. Thyroid Function Tests: No results for input(s): "TSH", "T4TOTAL", "FREET4", "T3FREE", "THYROIDAB" in the last  72 hours. Lipid Profile: No results for input(s): "CHOL", "HDL", "LDLCALC", "TRIG", "CHOLHDL", "LDLDIRECT" in the last 72 hours. Anemia Panel: No results for input(s): "VITAMINB12", "FOLATE", "FERRITIN", "TIBC", "IRON", "RETICCTPCT" in the last 72 hours. Urine analysis:    Component Value Date/Time   COLORURINE YELLOW 12/20/2020 1135   APPEARANCEUR CLEAR 12/20/2020 1135   LABSPEC 1.013 12/20/2020 1135   PHURINE 6.0 12/20/2020 1135   GLUCOSEU NEGATIVE 12/20/2020 1135   HGBUR SMALL (A) 12/20/2020 1135   BILIRUBINUR NEGATIVE 12/20/2020 1135   KETONESUR NEGATIVE 12/20/2020 1135   PROTEINUR NEGATIVE 12/20/2020 1135   NITRITE NEGATIVE 12/20/2020 1135   LEUKOCYTESUR TRACE (A) 12/20/2020 1135   Sepsis Labs: Invalid input(s): "PROCALCITONIN", "LACTICIDVEN"  Microbiology: Recent Results (from the past 240 hour(s))  Resp Panel by RT-PCR (Flu A&B, Covid) Anterior Nasal Swab     Status: None   Collection Time: 05/24/22  7:50 AM   Specimen: Anterior Nasal Swab  Result Value Ref Range Status   SARS Coronavirus  2 by RT PCR NEGATIVE NEGATIVE Final    Comment: (NOTE) SARS-CoV-2 target nucleic acids are NOT DETECTED.  The SARS-CoV-2 RNA is generally detectable in upper respiratory specimens during the acute phase of infection. The lowest concentration of SARS-CoV-2 viral copies this assay can detect is 138 copies/mL. A negative result does not preclude SARS-Cov-2 infection and should not be used as the sole basis for treatment or other patient management decisions. A negative result may occur with  improper specimen collection/handling, submission of specimen other than nasopharyngeal swab, presence of viral mutation(s) within the areas targeted by this assay, and inadequate number of viral copies(<138 copies/mL). A negative result must be combined with clinical observations, patient history, and epidemiological information. The expected result is Negative.  Fact Sheet for Patients:  EntrepreneurPulse.com.au  Fact Sheet for Healthcare Providers:  IncredibleEmployment.be  This test is no t yet approved or cleared by the [place] FDA and  has been authorized for detection and/or diagnosis of SARS-CoV-2 by FDA under an Emergency Use Authorization (EUA). This EUA will remain  in effect (meaning this test can be used) for the duration of the COVID-19 declaration under Section 564(b)(1) of the Act, 21 U.S.C.section 360bbb-3(b)(1), unless the authorization is terminated  or revoked sooner.       Influenza A by PCR NEGATIVE NEGATIVE Final   Influenza B by PCR NEGATIVE NEGATIVE Final    Comment: (NOTE) The Xpert Xpress SARS-CoV-2/FLU/RSV plus assay is intended as an aid in the diagnosis of influenza from Nasopharyngeal swab specimens and should not be used as a sole basis for treatment. Nasal washings and aspirates are unacceptable for Xpert Xpress SARS-CoV-2/FLU/RSV testing.  Fact Sheet for Patients: EntrepreneurPulse.com.au  Fact  Sheet for Healthcare Providers: IncredibleEmployment.be  This test is not yet approved or cleared by the [place] FDA and has been authorized for detection and/or diagnosis of SARS-CoV-2 by FDA under an Emergency Use Authorization (EUA). This EUA will remain in effect (meaning this test can be used) for the duration of the COVID-19 declaration under Section 564(b)(1) of the Act, 21 U.S.C. section 360bbb-3(b)(1), unless the authorization is terminated or revoked.  Performed at [facility], [place] [address], [place], [place] [address]     Radiology Studies: DG Abd Portable 1V  Result Date: 05/25/2022 CLINICAL DATA:  Generalized abdominal pain. EXAM: PORTABLE ABDOMEN - 1 VIEW COMPARISON:  None Available. FINDINGS: The bowel gas pattern is normal. No radio-opaque calculi or other significant radiographic abnormality are seen. IMPRESSION: Negative.  Electronically Signed   By: Marijo Conception M.D.   On: 05/25/2022 11:59      Redford Behrle T. [place]  If 7PM-7AM, please contact night-coverage [email or web address] 05/25/2022, 12:20 PM

## 2022-05-25 NOTE — Evaluation (Signed)
Physical Therapy Evaluation Patient Details Name: Gregory Christensen MRN: [id number] DOB: 1950-10-24 Today's Date: 05/25/2022  History of Present Illness  Patient admited with COPD. HPI: Gregory Christensen is a 71 y.o. male with medical history significant of COPD, tobacco abuse, HTN, CAD, DM2, PE/DVT, chronic hypoxic respiratory failure on 2L [place]. Presenting with shortness of breath. 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 Recent MRI showed: a small inferior endplate fracture at W09 with mild  bulging of the posterior wall without significant spinal canal  stenosis.  2. Degenerative changes of the lumbar spine with moderate spinal  canal stenosis at L2-3 and L3-4 and moderate to severe bilateral  neural foraminal narrowing at L4-5.  3. 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   Clinical Impression  Gregory Christensen is 71 y.o. male admitted with above HPI and diagnosis. Patient is currently limited by functional impairments below (see PT problem list). Patient lives with his  independent 23 y.o. mother, and is mod independent with AD for mobility at baseline; he has frequent assist available from his family (daughters, ex-wife). Patient will benefit from continued skilled PT interventions to address impairments and progress independence with mobility, recommending HHPT. Acute PT will follow and progress as able.        Recommendations for follow up therapy are one component of a multi-disciplinary discharge planning process, led by the attending physician.  Recommendations may be updated based on patient status, additional functional criteria and insurance authorization.  Follow Up Recommendations Home health PT      Assistance Recommended at Discharge Frequent or constant Supervision/Assistance  Patient can return home with the following  A little help with walking and/or transfers;A little help with bathing/dressing/bathroom;Assistance with  cooking/housework;Direct supervision/assist for medications management;Assist for transportation;Help with stairs or ramp for entrance    Equipment Recommendations None recommended by PT  Recommendations for Other Services       Functional Status Assessment Patient has had a recent decline in their functional status and demonstrates the ability to make significant improvements in function in a reasonable and predictable amount of time.     Precautions / Restrictions Precautions Precautions: Fall Precaution Comments: watch SpO2 Restrictions Weight Bearing Restrictions: No      Mobility  Bed Mobility Overal bed mobility: Needs Assistance Bed Mobility: Supine to Sit, Sit to Supine     Supine to sit: Supervision Sit to supine: Supervision   General bed mobility comments: use of bed rail, extra time    Transfers Overall transfer level: Needs assistance Equipment used: None Transfers: Sit to/from Stand Sit to Stand: Supervision           General transfer comment: sup for safety, pt steady with rise, bil UE use for power up    Ambulation/Gait Ambulation/Gait assistance: Min guard, Min assist Gait Distance (Feet): 200 Feet (seated rest at ~140') Assistive device:  (portable O2 tank) Gait Pattern/deviations: Step-through pattern, Decreased stride length, Trunk flexed Gait velocity: decr     General Gait Details: overall steady but crouched posture. pt with slight drift Lt and pushing O2 tank with Rt UE. as pt fatigued posture more flexed and pt SOB and required seated rest and cues for pursed lip breathing. SpO2 96% adn HR 101 bpm. on 2L with slight bump to 4L.  Stairs            Wheelchair Mobility    Modified Rankin (Stroke Patients Only)       Balance Overall balance  assessment: Mild deficits observed, not formally tested                                           Pertinent Vitals/Pain Pain Assessment Pain Assessment: No/denies pain     Home Living Family/patient expects to be discharged to:: Private residence Living Arrangements: Parent Available Help at Discharge: Family;Available PRN/intermittently Type of Home: House Home Access: Stairs to enter Entrance Stairs-Rails: None Entrance Stairs-Number of Steps: 2 in front; 4 in back   Home Layout: One level Home Equipment: Conservation officer, nature (2 wheels);Rollator (4 wheels);BSC/3in1;Cane - single point;Grab bars - tub/shower Additional Comments: home O2 @ 2L - ordered a wc and it's supoosed to be delivered    Prior Function Prior Level of Function : Independent/Modified Independent             Mobility Comments: "I take my cane and my oxygen tank wherever I go" Reports use of rollator as well ADLs Comments: independent     Hand Dominance   Dominant Hand: Right    Extremity/Trunk Assessment   Upper Extremity Assessment Upper Extremity Assessment: Overall WFL for tasks assessed;Defer to OT evaluation    Lower Extremity Assessment Lower Extremity Assessment: Overall WFL for tasks assessed    Cervical / Trunk Assessment Cervical / Trunk Assessment: Kyphotic  Communication   Communication: No difficulties  Cognition Arousal/Alertness: Awake/alert Behavior During Therapy: WFL for tasks assessed/performed Overall Cognitive Status: Within Functional Limits for tasks assessed                                          General Comments      Exercises     Assessment/Plan    PT Assessment Patient needs continued PT services  PT Problem List Decreased strength;Decreased range of motion;Decreased activity tolerance;Decreased balance;Decreased mobility;Decreased knowledge of use of DME;Decreased safety awareness;Decreased knowledge of precautions;Cardiopulmonary status limiting activity       PT Treatment Interventions DME instruction;Gait training;Stair training;Functional mobility training;Therapeutic activities;Therapeutic  exercise;Balance training;Patient/family education    PT Goals (Current goals can be found in the Care Plan section)  Acute Rehab PT Goals Patient Stated Goal: get better and back home PT Goal Formulation: With patient Time For Goal Achievement: 06/08/22 Potential to Achieve Goals: Good    Frequency Min 3X/week     Co-evaluation               AM-PAC PT "6 Clicks" Mobility  Outcome Measure Help needed turning from your back to your side while in a flat bed without using bedrails?: A Little Help needed moving from lying on your back to sitting on the side of a flat bed without using bedrails?: A Little Help needed moving to and from a bed to a chair (including a wheelchair)?: A Little Help needed standing up from a chair using your arms (e.g., wheelchair or bedside chair)?: A Little Help needed to walk in hospital room?: A Little Help needed climbing 3-5 steps with a railing? : A Lot 6 Click Score: 17    End of Session Equipment Utilized During Treatment: Gait belt;Oxygen Activity Tolerance: Patient tolerated treatment well Patient left: in bed;with call bell/phone within reach Nurse Communication: Mobility status PT Visit Diagnosis: Muscle weakness (generalized) (M62.81);Difficulty in walking, not elsewhere classified (R26.2);Other abnormalities of gait and  mobility (R26.89)    Time: 5075-7322 PT Time Calculation (min) (ACUTE ONLY): 26 min   Charges:   PT Evaluation $PT Eval Low Complexity: 1 Low PT Treatments $Gait Training: 8-22 mins        Verner Mould, DPT Acute Rehabilitation Services Office [phone]  05/25/22 3:25 PM

## 2022-05-25 NOTE — Evaluation (Signed)
Occupational Therapy Evaluation Patient Details Name: Gregory Christensen MRN: [id number] DOB: [birth year] Today's Date: 05/25/2022   History of Present Illness Patient admited with COPD. HPI: Gregory Christensen is a 71 y.o. male with medical history significant of COPD, tobacco abuse, HTN, CAD, DM2, PE/DVT, chronic hypoxic respiratory failure on 2L [place]. Presenting with shortness of breath. 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 Recent MRI showed: a small inferior endplate fracture at N82 with mild  bulging of the posterior wall without significant spinal canal  stenosis.  2. Degenerative changes of the lumbar spine with moderate spinal  canal stenosis at L2-3 and L3-4 and moderate to severe bilateral  neural foraminal narrowing at L4-5.  3. 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   Clinical Impression   [name] is a 71 year old man with above medical history. He reports to therapist that he was supposed to have spinal surgery today but he had to come to the hospital. Evaluation significantly limited due to patient reporting 10/10 back pain and need to urinate and have bowel movement. He was found on the [facility]. Nursing reports he has predominantly been on [facility] due to patient's feeling the need to void. Patient able to stand and manage clothing with supervision though complained of pain and then returned to seated position on BSC. He is transferring himself to the [facility] and nursing reports he was able to ambulate to and from the bathroom but limited by oxygen needs. He was found on 2 L [place]  with o2 sat 98% on left hand (Right hand read 80%). He appears grossly functional. Once pain and voiding issues improved - should be able to return home at his baseline.      Recommendations for follow up therapy are one component of a multi-disciplinary discharge planning process, led by the attending physician.  Recommendations may be updated based on patient status, additional  functional criteria and insurance authorization.   Follow Up Recommendations  No OT follow up     Assistance Recommended at Discharge PRN  Patient can return home with the following Assistance with cooking/housework;Help with stairs or ramp for entrance    Functional Status Assessment  Patient has had a recent decline in their functional status and demonstrates the ability to make significant improvements in function in a reasonable and predictable amount of time.  Equipment Recommendations  None recommended by OT    Recommendations for Other Services       Precautions / Restrictions Precautions Precaution Comments: monitor o2 sats      Mobility Bed Mobility               General bed mobility comments: up on BSC    Transfers Overall transfer level: Needs assistance Equipment used: None Transfers: Sit to/from Stand Sit to Stand: Supervision                  Balance Overall balance assessment: Mild deficits observed, not formally tested                                         ADL either performed or assessed with clinical judgement   ADL Overall ADL's : Needs assistance/impaired Eating/Feeding: Independent   Grooming: Set up;Sitting   Upper Body Bathing: Set up;Sitting   Lower Body Bathing: Set up;Sit to/from stand   Upper Body Dressing :  Set up;Sitting   Lower Body Dressing: Set up;Sit to/from stand   Toilet Transfer: Modified Independent;BSC/3in1 Armed forces technical officer Details (indicate cue type and reason): Independently transferring himself to [place] and Hygiene: Modified independent;Sit to/from stand       Functional mobility during ADLs: Supervision/safety       Vision Patient Visual Report: No change from baseline       Perception     Praxis      Pertinent Vitals/Pain Pain Assessment Pain Assessment: 0-10 Pain Score: 10-Worst pain ever Pain Location: Low back Pain Descriptors /  Indicators: Grimacing Pain Intervention(s): Limited activity within patient's tolerance, Monitored during session     Hand Dominance Right   Extremity/Trunk Assessment Upper Extremity Assessment Upper Extremity Assessment: Overall WFL for tasks assessed   Lower Extremity Assessment Lower Extremity Assessment: Defer to PT evaluation   Cervical / Trunk Assessment Cervical / Trunk Assessment: Kyphotic   Communication Communication Communication: No difficulties   Cognition Arousal/Alertness: Awake/alert Behavior During Therapy: WFL for tasks assessed/performed Overall Cognitive Status: Within Functional Limits for tasks assessed                                       General Comments       Exercises     Shoulder Instructions      Home Living Family/patient expects to be discharged to:: Private residence Living Arrangements: Parent Available Help at Discharge: Family;Available PRN/intermittently Type of Home: House Home Access: Stairs to enter [employer] of Steps: 2 in front; 4 in back Entrance Stairs-Rails: None Home Layout: One level     Bathroom Shower/Tub: Occupational psychologist: Standard Bathroom Accessibility: Yes How Accessible: Accessible via walker Home Equipment: Rolling Walker (2 wheels);Rollator (4 wheels);BSC/3in1;Cane - single point;Grab bars - tub/shower   Additional Comments: home O2 @ 2L - ordered a wc and it's supoosed to be delivered      Prior Functioning/Environment Prior Level of Function : Independent/Modified Independent             Mobility Comments: "I take my cane and my oxygen tank wherever I go" Reports use of walker PRN ADLs Comments: independent        OT Problem List: Decreased activity tolerance;Impaired balance (sitting and/or standing);Pain;Decreased knowledge of use of DME or AE;Decreased knowledge of precautions;Decreased safety awareness      OT Treatment/Interventions:  Self-care/ADL training;Therapeutic exercise;DME and/or AE instruction;Therapeutic activities;Balance training;Patient/family education    OT Goals(Current goals can be found in the care plan section) Acute Rehab OT Goals Patient Stated Goal: less pain, Time For Goal Achievement: 06/08/22 Potential to Achieve Goals: Fair  OT Frequency: Min 2X/week    Co-evaluation              AM-PAC OT "6 Clicks" Daily Activity     Outcome Measure Help from another person eating meals?: None Help from another person taking care of personal grooming?: None Help from another person toileting, which includes using toliet, bedpan, or urinal?: A Little Help from another person bathing (including washing, rinsing, drying)?: A Little Help from another person to put on and taking off regular upper body clothing?: None Help from another person to put on and taking off regular lower body clothing?: A Little 6 Click Score: 21   End of Session    Activity Tolerance: Patient limited by pain Patient left: Other (comment) (BSC)  OT  Visit Diagnosis: Pain                Time: 8473-0856 OT Time Calculation (min): 13 min Charges:  OT General Charges $OT Visit: 1 Visit OT Evaluation $OT Eval Low Complexity: 1 Low  Gustavo Lah, OTR/L [place]  Office [phone]   Lenward Chancellor 05/25/2022, 10:24 AM

## 2022-05-26 ENCOUNTER — Inpatient Hospital Stay (HOSPITAL_COMMUNITY): Payer: Medicare Other

## 2022-05-26 DIAGNOSIS — R410 Disorientation, unspecified: Secondary | ICD-10-CM

## 2022-05-26 DIAGNOSIS — E871 Hypo-osmolality and hyponatremia: Secondary | ICD-10-CM

## 2022-05-26 DIAGNOSIS — N179 Acute kidney failure, unspecified: Secondary | ICD-10-CM

## 2022-05-26 DIAGNOSIS — R338 Other retention of urine: Secondary | ICD-10-CM

## 2022-05-26 LAB — RENAL FUNCTION PANEL
Albumin: 4.6 g/dL (ref 3.5–5.0)
Albumin: 4 g/dL (ref 3.5–5.0)
Anion gap: 12 (ref 5–15)
Anion gap: 9 (ref 5–15)
BUN: 30 mg/dL — ABNORMAL HIGH (ref 8–23)
BUN: 33 mg/dL — ABNORMAL HIGH (ref 8–23)
CO2: 22 mmol/L (ref 22–32)
CO2: 24 mmol/L (ref 22–32)
Calcium: 9.6 mg/dL (ref 8.9–10.3)
Calcium: 8.9 mg/dL (ref 8.9–10.3)
Chloride: 102 mmol/L (ref 98–111)
Chloride: 102 mmol/L (ref 98–111)
Creatinine, Ser: 1.86 mg/dL — ABNORMAL HIGH (ref 0.61–1.24)
Creatinine, Ser: 2.19 mg/dL — ABNORMAL HIGH (ref 0.61–1.24)
GFR, Estimated: 38 mL/min — ABNORMAL LOW (ref >60)
GFR, Estimated: 32 mL/min — ABNORMAL LOW (ref >60)
Glucose, Bld: 90 mg/dL (ref 70–99)
Glucose, Bld: 138 mg/dL — ABNORMAL HIGH (ref 70–99)
Phosphorus: 3.3 mg/dL (ref 2.5–4.6)
Phosphorus: 3 mg/dL (ref 2.5–4.6)
Potassium: 4.7 mmol/L (ref 3.5–5.1)
Potassium: 5.4 mmol/L — ABNORMAL HIGH (ref 3.5–5.1)
Sodium: 136 mmol/L (ref 135–145)
Sodium: 135 mmol/L (ref 135–145)

## 2022-05-26 LAB — CBC
HCT: 43.9 % (ref 39.0–52.0)
Hemoglobin: 13.3 g/dL (ref 13.0–17.0)
MCH: 28.1 pg (ref 26.0–34.0)
MCHC: 30.3 g/dL (ref 30.0–36.0)
MCV: 92.6 fL (ref 80.0–100.0)
Platelets: 239 10*3/uL (ref 150–400)
RBC: 4.74 MIL/uL (ref 4.22–5.81)
RDW: 14.8 % (ref 11.5–15.5)
WBC: 10.6 10*3/uL — ABNORMAL HIGH (ref 4.0–10.5)
nRBC: 0 % (ref 0.0–0.2)

## 2022-05-26 LAB — CK: Total CK: 1032 U/L — ABNORMAL HIGH (ref 49–397)

## 2022-05-26 LAB — MAGNESIUM: Magnesium: 3.1 mg/dL — ABNORMAL HIGH (ref 1.7–2.4)

## 2022-05-26 MED ORDER — SODIUM CHLORIDE 0.9 % IV BOLUS: 500.0000 mL | Freq: Once | INTRAVENOUS | Status: DC

## 2022-05-26 MED ORDER — GABAPENTIN 300 MG PO CAPS: 300.0000 mg | ORAL_CAPSULE | Freq: Every day | ORAL | Status: DC

## 2022-05-26 MED ORDER — SODIUM CHLORIDE 0.9 % IV BOLUS
500.0000 mL | Freq: Once | INTRAVENOUS | Status: AC
  Administered 2022-05-26: 500 mL via INTRAVENOUS

## 2022-05-26 MED ORDER — SODIUM CHLORIDE 0.9 % IV SOLN: INTRAVENOUS | Status: DC

## 2022-05-26 MED ORDER — PREDNISONE 20 MG PO TABS: 40.0000 mg | ORAL_TABLET | Freq: Every day | ORAL | 0 refills | Status: AC

## 2022-05-26 MED ORDER — SODIUM ZIRCONIUM CYCLOSILICATE 10 G PO PACK
10.0000 g | PACK | Freq: Once | ORAL | Status: AC
  Administered 2022-05-26: 10 g via ORAL
  Filled 2022-05-26: qty 1

## 2022-05-26 MED ORDER — GABAPENTIN 300 MG PO CAPS
300.0000 mg | ORAL_CAPSULE | Freq: Two times a day (BID) | ORAL | Status: DC
  Administered 2022-05-26 – 2022-05-27 (×2): 300 mg via ORAL
  Filled 2022-05-26 (×2): qty 1

## 2022-05-26 NOTE — Progress Notes (Signed)
SATURATION QUALIFICATIONS: (This note is used to comply with regulatory documentation for home oxygen)  Patient Saturations on Room Air at Rest = 88%  Patient Saturations on Room Air while Ambulating = N/A%  Patient Saturations on 2 Liters of oxygen while Ambulating = 90-93%  Please briefly explain why patient needs home oxygen: Patient states baseline oxygen use at home is 2L.

## 2022-05-26 NOTE — Progress Notes (Signed)
Initial med pass pt would not respond to voice as he was sleeping. This RN voiced I needed to place his [place] back in place as I tried he attempted to slap my hands. This RN and NT attempted many times to verbally arouse pt. He would not reply to questions just started shaking his right arm/ hitting the bed. Advised pt we were going to clean him up and change his chuck as it was dirty and BM he allowed to be cleaned but when we tried to roll him he started hitting and kicking and jumped up out of bed starting to pull out IV and tried to pull out foley, ripped off tele,. NT grabbed foley bag to follow pt. Pt seemed confused pt wouldn't let anyone touch him and yelling, had to call security. Another RN cam in and also tried to calm pt. Eventually pt sat back down in bed wheezing heavily. Allowed [place] to put [place] back on. Paged on call order for ativan and soft restraints. IV half out and leaking, new order for IM. Charge assist with restraints. Pt went to sleep. 0300 pt sleeping but grunted yes for permission to replace [place], and he took his meds!

## 2022-05-26 NOTE — Progress Notes (Signed)
Pr request to add Robley Fries [phone] niece add to pt contacts work [employer] campus

## 2022-05-26 NOTE — Progress Notes (Signed)
PROGRESS NOTE  Gregory Christensen EXB:[id number] DOB: 01-Aug-1950   PCP: Amelia Jo, PA  Patient is from: Home  DOA: 05/24/2022 LOS: 1  Chief complaints Chief Complaint  Patient presents with   Shortness of Breath     Brief Narrative / Interim history: 71 year old M with PMH of COPD, hypoxic RF on 2 L, urothelial cancer involving left renal pelvis with thoracic spine metastasis s/p  left nephroureterectomy in 2020,  BPH, DM-2, CAD, PE, DVT, HTN and tobacco use disorder presenting with shortness of breath and indigestion, and admitted for COPD exacerbation.  He was started on systemic steroid, scheduled and as needed nebulizers.  The next day, patient had acute urinary retention and bladder over distention.  Foley catheter placed with removal of 1.1 L urine right away.  Needs Foley catheter at least for 1 week and outpatient follow-up with urology for voiding trial.  Patient developed AKI.  Renal US without significant finding. Started on IVF.   Subjective: Seen and examined earlier this morning.  Patient had an episode of confusion and agitation overnight likely delirium for which she was given IV Ativan and was put in soft wrist restraints.  He appears calm and well oriented this morning.  Soft restraints were discontinued.  Objective: Vitals:   05/26/22 0810 05/26/22 1356 05/26/22 1412 05/26/22 1418  BP:  (!) 84/55  94/61  Pulse:  95    Resp:  17    Temp:  98.2 F (36.8 C)    TempSrc:  Oral    SpO2: 93% 100% 98%   Weight:      Height:        Examination:  GENERAL: No apparent distress.  Nontoxic. HEENT: MMM.  Vision and hearing grossly intact.  NECK: Supple.  No apparent JVD.  RESP:  No IWOB.  Fair aeration bilaterally. CVS:  RRR. Heart sounds normal.  ABD/GI/GU: BS+. Abd soft, NTND.  Foley catheter in place. MSK/EXT:  Moves extremities. No apparent deformity. No edema.  SKIN: no apparent skin lesion or wound NEURO: Awake and alert. Oriented appropriately.  No  apparent focal neuro deficit. PSYCH: Calm. Normal affect.   Procedures:  None  Microbiology summarized: [id number] and influenza PCR nonreactive.  Assessment and plan: Principal Problem:   COPD exacerbation ([place]) Active Problems:   Acute on chronic respiratory failure with hypoxia (HCC)   Pulmonary embolism (HCC)   Chronic anticoagulation   BPH with urinary obstruction   DM2 (diabetes mellitus, type 2) (HCC)   Hyperlipidemia   CKD stage 3a   Essential hypertension   GERD (gastroesophageal reflux disease)   Dyspepsia   Urothelial carcinoma (HCC)   AKI (acute kidney injury) ([place])   Delirium  COPD exacerbation/chronic respiratory failure with hypoxia: COVID-19 and influenza PCR nonreactive.  CXR with hyperinflation.  Symptoms improved.  Ambulated on home 2 L and maintained appropriate saturation. -Continue systemic steroid -Continue Breo Ellipta and Yupelri -Continue DuoNebs to as needed -Encourage smoking cessation.  Nicotine patch  BPH with acute urinary retention-likely from holding his BPH meds.  Had 1.1 L urine right after inserting Foley.  Rectal exam with enlarged prostate.  Low suspicion for prostatitis.  Renal ultrasound without acute finding. -Continue Foley catheter until follow-up with urology in a week -Continue home Flomax and Proscar   AKI on CKD-3A: Likely prerenal.  He has urinary retention with bladder over distention but renal US without acute finding Recent Labs    10/20/21 0514 10/21/21 6644 10/22/21 0347 10/23/21 4259 03/26/22 0940 03/26/22 5638 05/24/22 7564  05/25/22 0506 05/26/22 0605 05/26/22 1333  BUN 16 22 26* 32* '14 12 12 19 '$ 30* 33*  CREATININE 1.49* 1.74* 1.80* 1.78* 1.75* 1.70* 1.48* 1.44* 1.86* 2.19*  -NS bolus followed by maintenance at 100 cc an hour -Avoid nephrotoxic meds -Renally dose medication -Recheck renal function in the morning  Hyperkalemia: In the setting of AKI. -P.o. Lokelma 10 g x 1 -IV fluid as above -Recheck in  the morning  Tobacco abuse: Reports smoking about half a pack a day. -Encourage cessation.  Continue nicotine patch.   Hx of PE/DVT -Continue home Eliquis.   History of CAD/HLD: No chest pain. -Continue home regimen   HTN -Cardiac meds as above  GERD -Protonix  -Discontinue GI cocktail in the setting of AKI   Urothelial carcinoma involving the left renal pelvis s/p left nephroureterectomy -Metastatic spinal lesion on T11/L1. -Followed by Dr. Lovena Neighbours and Dr. Alen Blew   Diet controlled DM-2: A1c 6.1%. -Monitor glucose with daily labs  Mild hyponatremia/hyperkalemia -Monitor  Delirium: Resolved. -Discontinued restraints -Reorientation and delirium precautions  Body mass index is 21.77 kg/m.          DVT prophylaxis:   apixaban (ELIQUIS) tablet 5 mg  Code Status: Full code Family Communication: None at bedside Level of care: Med-Surg Status is: Inpatient The patient will remain inpatient because: AKI and hyperkalemia   Final disposition: Likely home once medically stable Consultants:  None  Sch Meds:  Scheduled Meds:  albuterol  2.5 mg Nebulization Q6H   apixaban  5 mg Oral BID   atorvastatin  80 mg Oral QHS   buPROPion  150 mg Oral BID   ezetimibe  10 mg Oral Daily   ferrous sulfate  325 mg Oral Q M,W,F   finasteride  5 mg Oral Daily   fluticasone  1 spray Each Nare BID   fluticasone furoate-vilanterol  1 puff Inhalation Daily   [START ON 05/27/2022] gabapentin  300 mg Oral Daily   And   gabapentin  300 mg Oral QHS   guaiFENesin  600 mg Oral BID   metoprolol succinate  25 mg Oral QHS   montelukast  10 mg Oral QHS   nicotine  21 mg Transdermal Daily   pantoprazole  40 mg Oral BID   predniSONE  40 mg Oral Q breakfast   revefenacin  175 mcg Nebulization Daily   sodium zirconium cyclosilicate  10 g Oral Once   tamsulosin  0.4 mg Oral QPC supper   Continuous Infusions:  sodium chloride     Followed by   sodium chloride     PRN Meds:.acetaminophen  **OR** acetaminophen, ipratropium-albuterol, lip balm, loratadine, ondansetron **OR** ondansetron (ZOFRAN) IV, mouth rinse, oxyCODONE  Antimicrobials: Anti-infectives (From admission, onward)    None        I have personally reviewed the following labs and images: CBC: Recent Labs  Lab 05/24/22 0552 05/25/22 0506 05/26/22 0605  WBC 6.0 12.3* 10.6*  NEUTROABS 3.6  --   --   HGB 12.7* 13.1 13.3  HCT 41.5 41.3 43.9  MCV 91.2 89.6 92.6  PLT 242 287 239   BMP &GFR Recent Labs  Lab 05/24/22 0552 05/25/22 0506 05/26/22 0605 05/26/22 1333  NA 136 131* 136 135  K 4.0 5.2* 4.7 5.4*  CL 103 98 102 102  CO2 26 21* 22 24  GLUCOSE 100* 128* 90 138*  BUN 12 19 30* 33*  CREATININE 1.48* 1.44* 1.86* 2.19*  CALCIUM 9.5 9.5 9.6 8.9  MG  --   --  3.1*  --   PHOS  --   --  3.3 3.0   Estimated Creatinine Clearance: 33.2 mL/min (A) (by C-G formula based on SCr of 2.19 mg/dL (H)). Liver & Pancreas: Recent Labs  Lab 05/25/22 0506 05/26/22 0605 05/26/22 1333  AST 34  --   --   ALT 18  --   --   ALKPHOS 90  --   --   BILITOT 0.5  --   --   PROT 8.2*  --   --   ALBUMIN 4.4 4.6 4.0   No results for input(s): "LIPASE", "AMYLASE" in the last 168 hours. No results for input(s): "AMMONIA" in the last 168 hours. Diabetic: Recent Labs    05/24/22 1423  HGBA1C 6.1*   No results for input(s): "GLUCAP" in the last 168 hours. Cardiac Enzymes: No results for input(s): "CKTOTAL", "CKMB", "CKMBINDEX", "TROPONINI" in the last 168 hours. No results for input(s): "PROBNP" in the last 8760 hours. Coagulation Profile: No results for input(s): "INR", "PROTIME" in the last 168 hours. Thyroid Function Tests: No results for input(s): "TSH", "T4TOTAL", "FREET4", "T3FREE", "THYROIDAB" in the last 72 hours. Lipid Profile: No results for input(s): "CHOL", "HDL", "LDLCALC", "TRIG", "CHOLHDL", "LDLDIRECT" in the last 72 hours. Anemia Panel: No results for input(s): "VITAMINB12", "FOLATE",  "FERRITIN", "TIBC", "IRON", "RETICCTPCT" in the last 72 hours. Urine analysis:    Component Value Date/Time   COLORURINE YELLOW 12/20/2020 1135   APPEARANCEUR CLEAR 12/20/2020 1135   LABSPEC 1.013 12/20/2020 1135   PHURINE 6.0 12/20/2020 1135   GLUCOSEU NEGATIVE 12/20/2020 1135   HGBUR SMALL (A) 12/20/2020 1135   BILIRUBINUR NEGATIVE 12/20/2020 1135   KETONESUR NEGATIVE 12/20/2020 1135   PROTEINUR NEGATIVE 12/20/2020 1135   NITRITE NEGATIVE 12/20/2020 1135   LEUKOCYTESUR TRACE (A) 12/20/2020 1135   Sepsis Labs: Invalid input(s): "PROCALCITONIN", "LACTICIDVEN"  Microbiology: Recent Results (from the past 240 hour(s))  Resp Panel by RT-PCR (Flu A&B, Covid) Anterior Nasal Swab     Status: None   Collection Time: 05/24/22  7:50 AM   Specimen: Anterior Nasal Swab  Result Value Ref Range Status   SARS Coronavirus 2 by RT PCR NEGATIVE NEGATIVE Final    Comment: (NOTE) SARS-CoV-2 target nucleic acids are NOT DETECTED.  The SARS-CoV-2 RNA is generally detectable in upper respiratory specimens during the acute phase of infection. The lowest concentration of SARS-CoV-2 viral copies this assay can detect is 138 copies/mL. A negative result does not preclude SARS-Cov-2 infection and should not be used as the sole basis for treatment or other patient management decisions. A negative result may occur with  improper specimen collection/handling, submission of specimen other than nasopharyngeal swab, presence of viral mutation(s) within the areas targeted by this assay, and inadequate number of viral copies(<138 copies/mL). A negative result must be combined with clinical observations, patient history, and epidemiological information. The expected result is Negative.  Fact Sheet for Patients:  EntrepreneurPulse.com.au  Fact Sheet for Healthcare Providers:  IncredibleEmployment.be  This test is no t yet approved or cleared by the [place] FDA and   has been authorized for detection and/or diagnosis of SARS-CoV-2 by FDA under an Emergency Use Authorization (EUA). This EUA will remain  in effect (meaning this test can be used) for the duration of the COVID-19 declaration under Section 564(b)(1) of the Act, 21 U.S.C.section 360bbb-3(b)(1), unless the authorization is terminated  or revoked sooner.       Influenza A by PCR NEGATIVE NEGATIVE Final   Influenza B by PCR  NEGATIVE NEGATIVE Final    Comment: (NOTE) The Xpert Xpress SARS-CoV-2/FLU/RSV plus assay is intended as an aid in the diagnosis of influenza from Nasopharyngeal swab specimens and should not be used as a sole basis for treatment. Nasal washings and aspirates are unacceptable for Xpert Xpress SARS-CoV-2/FLU/RSV testing.  Fact Sheet for Patients: EntrepreneurPulse.com.au  Fact Sheet for Healthcare Providers: IncredibleEmployment.be  This test is not yet approved or cleared by the [place] FDA and has been authorized for detection and/or diagnosis of SARS-CoV-2 by FDA under an Emergency Use Authorization (EUA). This EUA will remain in effect (meaning this test can be used) for the duration of the COVID-19 declaration under Section 564(b)(1) of the Act, 21 U.S.C. section 360bbb-3(b)(1), unless the authorization is terminated or revoked.  Performed at [facility], [place] [address] [place], [place] [address]     Radiology Studies: US RENAL  Result Date: 05/26/2022 CLINICAL DATA:  Acute kidney injury.  Status post left nephrectomy. EXAM: RENAL / URINARY TRACT ULTRASOUND COMPLETE COMPARISON:  October 21, 2017.  March 26, 2022. FINDINGS: Right Kidney: Renal measurements: 10.1 x 4.2 x 3.9 cm = volume: 87 mL. Echogenicity within normal limits. No mass or hydronephrosis visualized. Status post left nephrectomy. Bladder: Decompressed secondary to Foley catheter. Other: None. IMPRESSION: Normal right kidney.   Status post left nephrectomy. Electronically Signed   By: Marijo Conception M.D.   On: 05/26/2022 09:58      Christy Friede T. [place]  If 7PM-7AM, please contact night-coverage [email or web address] 05/26/2022, 3:01 PM

## 2022-05-26 NOTE — Discharge Summary (Signed)
Physician Discharge Summary  Gregory Christensen OHY:[id number] DOB: 06-Sep-1950 DOA: 05/24/2022  PCP: Amelia Jo, PA  Admit date: 05/24/2022 Discharge date: 05/27/2022 Admitted From: Home Disposition: Home Recommendations for Outpatient Follow-up:  Follow up with PCP in 1 week Outpatient follow-up with urology in 1 week for voiding trial Check CMP and CBC in 1 week Please follow up on the following pending results: None  Home Health: PT Equipment/Devices: Patient has home oxygen  Discharge Condition: Stable CODE STATUS: Full code  Follow-up Information     Amelia Jo, [place]. Schedule an appointment as soon as possible for a visit in 1 week(s).   Specialty: Physician Assistant Contact information: [place] [address] [phone]         Ceasar Mons, MD. Schedule an appointment as soon as possible for a visit in 1 week(s).   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  He was started on systemic steroid, scheduled and as needed nebulizers.   The next day, patient had acute urinary retention and bladder over distention.  Foley catheter placed with removal of 1.1 L urine right away.  He also developed mild AKI.  Renal US without significant finding.  He was started on IV fluid with improvement in his renal function.  He is discharged with Foley catheter for outpatient voiding trial.  He will follow with his urologist outpatient.  In regards to his COPD, his respiratory symptoms improved tremendously.  He is discharged  on p.o. prednisone to complete treatment course.  He will continue home breathing treatments including triple inhaler.  He has been encouraged to quit smoking cigarettes.  See individual problem list below for more.   Problems addressed during this hospitalization Principal Problem:   COPD exacerbation ([facility]) Active Problems:   Acute on chronic respiratory failure with hypoxia (HCC)   Pulmonary embolism (HCC)   Chronic anticoagulation   BPH with urinary obstruction   DM2 (diabetes mellitus, type 2) (HCC)   Hyperlipidemia   CKD stage 3a   Essential hypertension   GERD (gastroesophageal reflux disease)   Dyspepsia   Urothelial carcinoma (HCC)   AKI (acute kidney injury) ([place])   Delirium   COPD exacerbation/chronic respiratory failure with hypoxia: COVID-19 and influenza PCR nonreactive.  CXR with hyperinflation.  Respiratory symptoms improved.  Ambulated on home 2 L and maintained appropriate saturation. -Discharged on p.o. prednisone for 3 more days -Continue home Wixela, Spiriva and as needed albuterol -Encouraged to quit smoking cigarettes -HH PT ordered.   BPH with acute urinary retention-likely from holding his BPH meds.  Had 1.1 L urine right after inserting Foley.  Rectal exam with enlarged prostate.  Low suspicion for prostatitis. -Discharged with Foley catheter.  Outpatient follow-up with urology in 1 week for voiding trial -Continue Flomax and Proscar   AKI on CKD-3A: Unclear cause.  Had urine retention with bladder over distention but renal US without obstruction.  CK slightly elevated.  Improved with IV fluid hydration Recent Labs    10/21/21 0628 10/22/21 0508 10/23/21 0433 03/26/22 0940  03/26/22 0956 05/24/22 0552 05/25/22 0506 05/26/22 0605 05/26/22 1333 05/27/22 0542  BUN 22 26* 32* _0 30* 33* 30*  CREATININE 1.74* 1.80* 1.78* 1.75* 1.70* 1.48* 1.44* 1.86* 2.19* 1.95*  -Continue indwelling Foley continue -Voiding trial outpatient -Recheck renal  function outpatient  BPH with acute urinary retention-likely from holding his BPH meds.  Had 1.1 L urine right after inserting Foley.  Rectal exam with enlarged prostate.  Low suspicion for prostatitis.  Renal ultrasound without acute finding. -Continue Foley catheter until follow-up with urology in a week -Continue home Flomax and Proscar  Urothelial carcinoma involving the left renal pelvis s/p left nephroureterectomy -Metastatic spinal lesion on T11/L1. -Followed by Dr. Lovena Neighbours and Dr. Alen Blew  Mild rhabdomyolysis: CK down to 700 -Hold Lipitor -Recheck CMP at follow-up   Tobacco abuse: Reports smoking about half a pack a day. -Encourage cessation.  Continue nicotine patch.   Hx of PE/DVT -Continue home Eliquis.   History of CAD/HLD/HTN: No chest pain. -Continue home Toprol-XL. -Hold Lipitor in the setting of rhabdo   GERD -Protonix    Diet controlled DM-2: A1c 6.1%. -Continue home meds   Hyponatremia/hyperkalemia: Resolved   Delirium: Resolved.            Vital signs Vitals:   05/27/22 0141 05/27/22 0458 05/27/22 0831 05/27/22 1353  BP:  (!) 97/55  108/81  Pulse:  64  69  Temp:  97.8 F (36.6 C)  98.2 F (36.8 C)  Resp:  18  18  Height:      Weight:      SpO2: 97% 100% 92% 100%  TempSrc:  Oral  Oral  BMI (Calculated):         Discharge exam  GENERAL: No apparent distress.  Nontoxic. HEENT: MMM.  Vision and hearing grossly intact.  NECK: Supple.  No apparent JVD.  RESP:  No IWOB.  Fair aeration bilaterally. CVS:  RRR. Heart sounds normal.  ABD/GI/GU: BS+. Abd soft, NTND.  Indwelling Foley catheter.  Urine clear. MSK/EXT:  Moves extremities. No apparent deformity. No edema.  SKIN: no apparent skin lesion or wound NEURO: Awake and alert. Oriented appropriately.  No apparent focal neuro deficit. PSYCH: Calm. Normal affect.   Discharge Instructions Discharge Instructions     Call MD for:  difficulty breathing, headache or visual disturbances    Complete by: As directed    Call MD for:  extreme fatigue   Complete by: As directed    Call MD for:  severe uncontrolled pain   Complete by: As directed    Diet general   Complete by: As directed    Discharge instructions   Complete by: As directed    It has been a pleasure taking care of you!  You were hospitalized due to COPD exacerbation for which you have been treated.  Your symptoms improved to the point we think it is safe to let you go home.  Discharging you more prednisone to complete treatment course.  It is very important that you use your breathing treatments as prescribed. It is also very important that you quit smoking cigarettes.  You may use nicotine patch to help you quit smoking.  Nicotine patch is available over-the-counter.  You may also discuss other options to help you quit smoking with your primary care doctor. You can also talk to professional counselors at 1-800-QUIT-NOW 6614370584) for free smoking cessation counseling.  You had urinary retention during this hospitalization likely due to enlarged prostate.  We are discharging  you with Foley catheter until you follow-up with your urologist in about a week.  We recommend holding your cholesterol medication (atorvastatin) until you follow-up with your primary care doctor in 1 to 2 weeks.  We recommend good hydration and avoiding any over-the-counter pain medication other than plain Tylenol.   Take care,   Increase activity slowly   Complete by: As directed       Allergies as of 05/27/2022   No Known Allergies      Medication List     STOP taking these medications    Fluticasone Propionate (Inhal) 250 MCG/ACT Aepb       TAKE these medications    albuterol 108 (90 Base) MCG/ACT inhaler Commonly known as: VENTOLIN HFA Inhale 2 puffs into the lungs every 4 (four) hours as needed for wheezing or shortness of breath.   albuterol (2.5 MG/3ML) 0.083% nebulizer solution Commonly known as:  PROVENTIL Take 3 mLs (2.5 mg total) by nebulization every 6 (six) hours as needed for wheezing or shortness of breath.   ammonium lactate 12 % lotion Commonly known as: LAC-HYDRIN Apply 1 Application topically as needed for dry skin.   apixaban 5 MG Tabs tablet Commonly known as: Eliquis Take 1 tablet (5 mg total) by mouth 2 (two) times daily.   ascorbic acid 500 MG tablet Commonly known as: VITAMIN C Take 500 mg by mouth in the morning and at bedtime.   atorvastatin 80 MG tablet Commonly known as: LIPITOR Take 1 tablet (80 mg total) by mouth at bedtime. Start taking on: June 09, 2022 What changed: These instructions start on June 09, 2022. If you are unsure what to do until then, ask your doctor or other care provider.   buPROPion 150 MG 12 hr tablet Commonly known as: WELLBUTRIN SR Take 150 mg by mouth 2 (two) times daily.   diazepam 5 MG tablet Commonly known as: VALIUM Take the day of MRI as needed   diclofenac Sodium 1 % Gel Commonly known as: VOLTAREN Apply 4 g topically 2 (two) times daily as needed (Pain).   docusate sodium 100 MG capsule Commonly known as: COLACE Take 100 mg by mouth 2 (two) times daily.   ezetimibe 10 MG tablet Commonly known as: ZETIA Take 1 tablet (10 mg total) by mouth daily.   ferrous sulfate 325 (65 FE) MG tablet Take 1 tablet (325 mg total) by mouth daily. What changed: when to take this   finasteride 5 MG tablet Commonly known as: PROSCAR Take 5 mg by mouth daily.   fluticasone 50 MCG/ACT nasal spray Commonly known as: FLONASE Place 1 spray into both nostrils as needed for allergies or rhinitis.   gabapentin 300 MG capsule Commonly known as: NEURONTIN Take 300-600 mg by mouth See admin instructions. Taking 300 mg in the AM and 600 mg at bedtime.   levocetirizine 5 MG tablet Commonly known as: XYZAL Take 5 mg by mouth daily as needed for allergies.   lidocaine 5 % Commonly known as: LIDODERM Place 1 patch onto the  skin every 12 (twelve) hours as needed (pain).   meclizine 25 MG tablet Commonly known as: ANTIVERT Take 25 mg by mouth 3 (three) times daily as needed for dizziness.   methenamine 1 g tablet Commonly known as: MANDELAMINE Take 1,000 mg by mouth 2 (two) times daily.   metoprolol succinate 25 MG 24 hr tablet Commonly known as: Toprol XL Take 1 tablet (25 mg total) by mouth at bedtime.   montelukast 10  MG tablet Commonly known as: SINGULAIR Take 10 mg by mouth at bedtime.   Narcan 4 MG/0.1ML Liqd nasal spray kit Generic drug: naloxone Place 1 spray into the nose as needed (opioid overdose).   nicotine 21 mg/24hr patch Commonly known as: NICODERM CQ - dosed in mg/24 hours Place 1 patch (21 mg total) onto the skin daily. What changed: Another medication with the same name was removed. Continue taking this medication, and follow the directions you see here.   nitroGLYCERIN 0.4 MG SL tablet Commonly known as: NITROSTAT DISSOLVE ONE TABLET UNDER THE TONGUE EVERY 5 MINUTES AS NEEDED FOR CHEST PAIN.  DO NOT EXCEED A TOTAL OF 3 DOSES IN 15 MINUTES NOW What changed:  how much to take how to take this when to take this reasons to take this   oxyCODONE-acetaminophen 10-325 MG tablet Commonly known as: Percocet Take 1 tablet by mouth every 6 (six) hours as needed for pain.   pantoprazole 40 MG tablet Commonly known as: Protonix Take 1 tablet (40 mg total) by mouth 2 (two) times daily.   predniSONE 20 MG tablet Commonly known as: DELTASONE Take 2 tablets (40 mg total) by mouth daily with breakfast for 3 days.   senna-docusate 8.6-50 MG tablet Commonly known as: Senokot-S Take 1 tablet by mouth 2 (two) times daily as needed for moderate constipation.   sildenafil 100 MG tablet Commonly known as: VIAGRA Take 100 mg by mouth daily as needed for erectile dysfunction.   Spiriva Respimat 2.5 MCG/ACT Aers Generic drug: Tiotropium Bromide Monohydrate Inhale 2 puffs into the lungs  daily.   tamsulosin 0.4 MG Caps capsule Commonly known as: FLOMAX Take 0.4 mg by mouth at bedtime.   Wixela Inhub 250-50 MCG/ACT Aepb Generic drug: fluticasone-salmeterol Inhale 1 puff into the lungs in the morning and at bedtime.        Consultations: None  Procedures/Studies:   US RENAL  Result Date: 05/26/2022 CLINICAL DATA:  Acute kidney injury.  Status post left nephrectomy. EXAM: RENAL / URINARY TRACT ULTRASOUND COMPLETE COMPARISON:  October 21, 2017.  March 26, 2022. FINDINGS: Right Kidney: Renal measurements: 10.1 x 4.2 x 3.9 cm = volume: 87 mL. Echogenicity within normal limits. No mass or hydronephrosis visualized. Status post left nephrectomy. Bladder: Decompressed secondary to Foley catheter. Other: None. IMPRESSION: Normal right kidney.  Status post left nephrectomy. Electronically Signed   By: Marijo Conception M.D.   On: 05/26/2022 09:58   DG Abd Portable 1V  Result Date: 05/25/2022 CLINICAL DATA:  Generalized abdominal pain. EXAM: PORTABLE ABDOMEN - 1 VIEW COMPARISON:  None Available. FINDINGS: The bowel gas pattern is normal. No radio-opaque calculi or other significant radiographic abnormality are seen. IMPRESSION: Negative. Electronically Signed   By: Marijo Conception M.D.   On: 05/25/2022 11:59   DG Chest Port 1 View  Result Date: 05/24/2022 CLINICAL DATA:  Shortness of breath.  History of COPD. EXAM: PORTABLE CHEST 1 VIEW COMPARISON:  03/26/2022 FINDINGS: Heart size and mediastinal contours are unremarkable. No pleural effusion or edema identified. Lungs are hyperinflated and there are advanced changes of emphysema. No airspace opacities. IMPRESSION: 1. Lungs are clear. 2. Hyperinflation and emphysema. Correlate for clinical signs or symptoms of COPD exacerbation. Electronically Signed   By: Kerby Moors M.D.   On: 05/24/2022 06:59   MR THORACIC SPINE W WO CONTRAST  Result Date: 05/06/2022 CLINICAL DATA:  Malignant tumor of renal pelvis, left (HCC) C65.2  (ICD-10-CM). EXAM: MRI THORACIC AND LUMBAR SPINE WITHOUT AND  WITH CONTRAST TECHNIQUE: Multiplanar and multiecho pulse sequences of the thoracic and lumbar spine were obtained without and with intravenous contrast. CONTRAST:  82m GADAVIST GADOBUTROL 1 MMOL/ML IV SOLN COMPARISON:  CT chest abdomen and pelvis September 25 20 FINDINGS: MRI THORACIC SPINE FINDINGS Alignment:  Physiologic. Vertebrae: A T1 hypointense, enhancing lesion is seen on the right side of the T11 vertebral body extending into the corresponding pedicle, correlating with lytic lesion described on prior CT and concerning for metastatic disease. There is a associated small inferior endplate fracture and mild bulging of the posterior wall on the right side without significant spinal canal stenosis. Minimal chronic compression fracture of the superior endplate of T4. Cord:  Normal signal and morphology. Paraspinal and other soft tissues: Irregular subpleural hyperintensity of the peripheral right upper lobe correlating finding described on prior CT of the chest. Disc levels: Small posterior disc protrusions at T2-3, T3-4, T4-5, T5-6, T7-8 and T10-11, causing minimal indentation on the thecal sac. No significant spinal canal or neural foraminal stenosis at any thoracic level. MRI LUMBAR SPINE FINDINGS Segmentation: A transitional lumbosacral vertebra is assumed to represent sacralized L5 level. Careful correlation with this numbering strategy prior to any procedural intervention would be recommended. Alignment:  Small retrolisthesis of L4 over L5. Vertebrae: A T1 hypointense, enhancing lesion is seen in the L1 vertebral body at the inferior endplate. Given absence of significant degenerative disc disease at this level, this most likely represent a metastatic lesion. Endplate degenerative changes at L4-5. Conus medullaris: Extends to the L1 level and appears normal. Paraspinal and other soft tissues: Absent left kidney. Disc levels: L1-2: Mild-to-moderate  facet degenerative changes. No significant spinal canal or neural foraminal stenosis. L2-3: Disc bulge, moderate hypertrophic facet degenerative change ligamentum flavum redundancy resulting in moderate spinal canal stenosis with mild narrowing of the bilateral subarticular zones and mild right neural foraminal narrowing. L3-4: Disc bulge, advanced hypertrophic facet degenerative change ligamentum flavum redundancy resulting in moderate spinal canal stenosis with mild narrowing of the bilateral subarticular zones and mild left neural foraminal narrowing. L4-5: Loss of disc height, disc bulge with associated osteophytic component and advanced facet degenerative changes with ligamentum flavum redundancy resulting in mild spinal canal stenosis with prominent narrowing of the bilateral subarticular zones and moderate to severe bilateral neural foraminal narrowing. L5-S1: No spinal canal or neural foraminal stenosis. IMPRESSION: 1. 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 There is a small inferior endplate fracture at TS97with mild bulging of the posterior wall without significant spinal canal stenosis. 2. Degenerative changes of the lumbar spine with moderate spinal canal stenosis at L2-3 and L3-4 and moderate to severe bilateral neural foraminal narrowing at L4-5. 3. 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 Electronically Signed   By: KPedro EarlsM.D.   On: 05/06/2022 11:00   MR Lumbar Spine W Wo Contrast  Result Date: 05/06/2022 CLINICAL DATA:  Malignant tumor of renal pelvis, left (HCC) C65.2 (ICD-10-CM). EXAM: MRI THORACIC AND LUMBAR SPINE WITHOUT AND WITH CONTRAST TECHNIQUE: Multiplanar and multiecho pulse sequences of the thoracic and lumbar spine were obtained without and with intravenous contrast. CONTRAST:  755mGADAVIST GADOBUTROL 1 MMOL/ML IV SOLN COMPARISON:  CT chest abdomen and pelvis September 25 20 FINDINGS: MRI THORACIC SPINE FINDINGS  Alignment:  Physiologic. Vertebrae: A T1 hypointense, enhancing lesion is seen on the right side of the T11 vertebral body extending into the corresponding pedicle, correlating with lytic lesion described on prior CT and concerning for metastatic  disease. There is a associated small inferior endplate fracture and mild bulging of the posterior wall on the right side without significant spinal canal stenosis. Minimal chronic compression fracture of the superior endplate of T4. Cord:  Normal signal and morphology. Paraspinal and other soft tissues: Irregular subpleural hyperintensity of the peripheral right upper lobe correlating finding described on prior CT of the chest. Disc levels: Small posterior disc protrusions at T2-3, T3-4, T4-5, T5-6, T7-8 and T10-11, causing minimal indentation on the thecal sac. No significant spinal canal or neural foraminal stenosis at any thoracic level. MRI LUMBAR SPINE FINDINGS Segmentation: A transitional lumbosacral vertebra is assumed to represent sacralized L5 level. Careful correlation with this numbering strategy prior to any procedural intervention would be recommended. Alignment:  Small retrolisthesis of L4 over L5. Vertebrae: A T1 hypointense, enhancing lesion is seen in the L1 vertebral body at the inferior endplate. Given absence of significant degenerative disc disease at this level, this most likely represent a metastatic lesion. Endplate degenerative changes at L4-5. Conus medullaris: Extends to the L1 level and appears normal. Paraspinal and other soft tissues: Absent left kidney. Disc levels: L1-2: Mild-to-moderate facet degenerative changes. No significant spinal canal or neural foraminal stenosis. L2-3: Disc bulge, moderate hypertrophic facet degenerative change ligamentum flavum redundancy resulting in moderate spinal canal stenosis with mild narrowing of the bilateral subarticular zones and mild right neural foraminal narrowing. L3-4: Disc bulge, advanced  hypertrophic facet degenerative change ligamentum flavum redundancy resulting in moderate spinal canal stenosis with mild narrowing of the bilateral subarticular zones and mild left neural foraminal narrowing. L4-5: Loss of disc height, disc bulge with associated osteophytic component and advanced facet degenerative changes with ligamentum flavum redundancy resulting in mild spinal canal stenosis with prominent narrowing of the bilateral subarticular zones and moderate to severe bilateral neural foraminal narrowing. L5-S1: No spinal canal or neural foraminal stenosis. IMPRESSION: 1. 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 There is a small inferior endplate fracture at Y07 with mild bulging of the posterior wall without significant spinal canal stenosis. 2. Degenerative changes of the lumbar spine with moderate spinal canal stenosis at L2-3 and L3-4 and moderate to severe bilateral neural foraminal narrowing at L4-5. 3. 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 Electronically Signed   By: Pedro Earls M.D.   On: 05/06/2022 11:00       The results of significant diagnostics from this hospitalization (including imaging, microbiology, ancillary and laboratory) are listed below for reference.     Microbiology: Recent Results (from the past 240 hour(s))  Resp Panel by RT-PCR (Flu A&B, Covid) Anterior Nasal Swab     Status: None   Collection Time: 05/24/22  7:50 AM   Specimen: Anterior Nasal Swab  Result Value Ref Range Status   SARS Coronavirus 2 by RT PCR NEGATIVE NEGATIVE Final    Comment: (NOTE) SARS-CoV-2 target nucleic acids are NOT DETECTED.  The SARS-CoV-2 RNA is generally detectable in upper respiratory specimens during the acute phase of infection. The lowest concentration of SARS-CoV-2 viral copies this assay can detect is 138 copies/mL. A negative result does not preclude SARS-Cov-2 infection and should not be used as the sole  basis for treatment or other patient management decisions. A negative result may occur with  improper specimen collection/handling, submission of specimen other than nasopharyngeal swab, presence of viral mutation(s) within the areas targeted by this assay, and inadequate number of viral copies(<138 copies/mL). A negative result must be combined with clinical observations,  patient history, and epidemiological information. The expected result is Negative.  Fact Sheet for Patients:  EntrepreneurPulse.com.au  Fact Sheet for Healthcare Providers:  IncredibleEmployment.be  This test is no t yet approved or cleared by the [place] FDA and  has been authorized for detection and/or diagnosis of SARS-CoV-2 by FDA under an Emergency Use Authorization (EUA). This EUA will remain  in effect (meaning this test can be used) for the duration of the COVID-19 declaration under Section 564(b)(1) of the Act, 21 U.S.C.section 360bbb-3(b)(1), unless the authorization is terminated  or revoked sooner.       Influenza A by PCR NEGATIVE NEGATIVE Final   Influenza B by PCR NEGATIVE NEGATIVE Final    Comment: (NOTE) The Xpert Xpress SARS-CoV-2/FLU/RSV plus assay is intended as an aid in the diagnosis of influenza from Nasopharyngeal swab specimens and should not be used as a sole basis for treatment. Nasal washings and aspirates are unacceptable for Xpert Xpress SARS-CoV-2/FLU/RSV testing.  Fact Sheet for Patients: EntrepreneurPulse.com.au  Fact Sheet for Healthcare Providers: IncredibleEmployment.be  This test is not yet approved or cleared by the [place] FDA and has been authorized for detection and/or diagnosis of SARS-CoV-2 by FDA under an Emergency Use Authorization (EUA). This EUA will remain in effect (meaning this test can be used) for the duration of the COVID-19 declaration under Section 564(b)(1) of the Act,  21 U.S.C. section 360bbb-3(b)(1), unless the authorization is terminated or revoked.  Performed at [facility], [place] [address] [place], [place] [address]      Labs:  CBC: Recent Labs  Lab 05/24/22 [phone] 05/25/22 0506 05/26/22 0605 05/27/22 0541  WBC 6.0 12.3* 10.6* 7.6  NEUTROABS 3.6  --   --   --   HGB 12.7* 13.1 13.3 11.1*  HCT 41.5 41.3 43.9 36.7*  MCV 91.2 89.6 92.6 93.6  PLT 242 287 239 190   BMP &GFR Recent Labs  Lab 05/24/22 0552 05/25/22 0506 05/26/22 0605 05/26/22 1333 05/27/22 0541 05/27/22 0542  NA 136 131* 136 135  --  139  K 4.0 5.2* 4.7 5.4*  --  4.0  CL 103 98 102 102  --  105  CO2 26 21* 22 24  --  23  GLUCOSE 100* 128* 90 138*  --  87  BUN 12 19 30* 33*  --  30*  CREATININE 1.48* 1.44* 1.86* 2.19*  --  1.95*  CALCIUM 9.5 9.5 9.6 8.9  --  8.5*  MG  --   --  3.1*  --  2.8*  --   PHOS  --   --  3.3 3.0  --  3.3   Estimated Creatinine Clearance: 37.3 mL/min (A) (by C-G formula based on SCr of 1.95 mg/dL (H)). Liver & Pancreas: Recent Labs  Lab 05/25/22 0506 05/26/22 0605 05/26/22 1333 05/27/22 0541 05/27/22 0542  AST 34  --   --  43*  --   ALT 18  --   --  25  --   ALKPHOS 90  --   --  78  --   BILITOT 0.5  --   --  0.3  --   PROT 8.2*  --   --  6.5  --   ALBUMIN 4.4 4.6 4.0 3.6 3.4*   No results for input(s): "LIPASE", "AMYLASE" in the last 168 hours. No results for input(s): "AMMONIA" in the last 168 hours. Diabetic: No results for input(s): "HGBA1C" in the last 72 hours.  No results for input(s): "  GLUCAP" in the last 168 hours. Cardiac Enzymes: Recent Labs  Lab 05/26/22 0605 05/27/22 0541  CKTOTAL 1,032* 706*   No results for input(s): "PROBNP" in the last 8760 hours. Coagulation Profile: No results for input(s): "INR", "PROTIME" in the last 168 hours. Thyroid Function Tests: No results for input(s): "TSH", "T4TOTAL", "FREET4", "T3FREE", "THYROIDAB" in the last 72 hours. Lipid Profile: No results for  input(s): "CHOL", "HDL", "LDLCALC", "TRIG", "CHOLHDL", "LDLDIRECT" in the last 72 hours. Anemia Panel: No results for input(s): "VITAMINB12", "FOLATE", "FERRITIN", "TIBC", "IRON", "RETICCTPCT" in the last 72 hours. Urine analysis:    Component Value Date/Time   COLORURINE YELLOW 12/20/2020 1135   APPEARANCEUR CLEAR 12/20/2020 1135   LABSPEC 1.013 12/20/2020 1135   PHURINE 6.0 12/20/2020 1135   GLUCOSEU NEGATIVE 12/20/2020 1135   HGBUR SMALL (A) 12/20/2020 1135   BILIRUBINUR NEGATIVE 12/20/2020 1135   KETONESUR NEGATIVE 12/20/2020 1135   PROTEINUR NEGATIVE 12/20/2020 1135   NITRITE NEGATIVE 12/20/2020 1135   LEUKOCYTESUR TRACE (A) 12/20/2020 1135   Sepsis Labs: Invalid input(s): "PROCALCITONIN", "LACTICIDVEN"   SIGNED:  Mercy Riding, MD  [facility] 05/27/2022, 3:35 PM

## 2022-05-27 LAB — RENAL FUNCTION PANEL
Albumin: 3.4 g/dL — ABNORMAL LOW (ref 3.5–5.0)
Anion gap: 11 (ref 5–15)
BUN: 30 mg/dL — ABNORMAL HIGH (ref 8–23)
CO2: 23 mmol/L (ref 22–32)
Calcium: 8.5 mg/dL — ABNORMAL LOW (ref 8.9–10.3)
Chloride: 105 mmol/L (ref 98–111)
Creatinine, Ser: 1.95 mg/dL — ABNORMAL HIGH (ref 0.61–1.24)
GFR, Estimated: 36 mL/min — ABNORMAL LOW (ref >60)
Glucose, Bld: 87 mg/dL (ref 70–99)
Phosphorus: 3.3 mg/dL (ref 2.5–4.6)
Potassium: 4 mmol/L (ref 3.5–5.1)
Sodium: 139 mmol/L (ref 135–145)

## 2022-05-27 LAB — CBC
HCT: 36.7 % — ABNORMAL LOW (ref 39.0–52.0)
Hemoglobin: 11.1 g/dL — ABNORMAL LOW (ref 13.0–17.0)
MCH: 28.3 pg (ref 26.0–34.0)
MCHC: 30.2 g/dL (ref 30.0–36.0)
MCV: 93.6 fL (ref 80.0–100.0)
Platelets: 190 10*3/uL (ref 150–400)
RBC: 3.92 MIL/uL — ABNORMAL LOW (ref 4.22–5.81)
RDW: 14.5 % (ref 11.5–15.5)
WBC: 7.6 10*3/uL (ref 4.0–10.5)
nRBC: 0 % (ref 0.0–0.2)

## 2022-05-27 LAB — HEPATIC FUNCTION PANEL
ALT: 25 U/L (ref 0–44)
AST: 43 U/L — ABNORMAL HIGH (ref 15–41)
Albumin: 3.6 g/dL (ref 3.5–5.0)
Alkaline Phosphatase: 78 U/L (ref 38–126)
Bilirubin, Direct: 0.1 mg/dL (ref 0.0–0.2)
Indirect Bilirubin: 0.2 mg/dL — ABNORMAL LOW (ref 0.3–0.9)
Total Bilirubin: 0.3 mg/dL (ref 0.3–1.2)
Total Protein: 6.5 g/dL (ref 6.5–8.1)

## 2022-05-27 LAB — CK: Total CK: 706 U/L — ABNORMAL HIGH (ref 49–397)

## 2022-05-27 LAB — MAGNESIUM: Magnesium: 2.8 mg/dL — ABNORMAL HIGH (ref 1.7–2.4)

## 2022-05-27 MED ORDER — CHLORHEXIDINE GLUCONATE CLOTH 2 % EX PADS
6.0000 | MEDICATED_PAD | Freq: Every day | CUTANEOUS | Status: DC
  Administered 2022-05-27: 6 via TOPICAL

## 2022-05-27 MED ORDER — SENNOSIDES-DOCUSATE SODIUM 8.6-50 MG PO TABS
1.0000 | ORAL_TABLET | Freq: Two times a day (BID) | ORAL | Status: DC | PRN
  Administered 2022-05-27: 1 via ORAL
  Filled 2022-05-27: qty 1

## 2022-05-27 MED ORDER — SENNOSIDES-DOCUSATE SODIUM 8.6-50 MG PO TABS: 1.0000 | ORAL_TABLET | Freq: Two times a day (BID) | ORAL | Status: DC | PRN

## 2022-05-27 MED ORDER — ATORVASTATIN CALCIUM 80 MG PO TABS: 80.0000 mg | ORAL_TABLET | Freq: Every day | ORAL | Status: DC

## 2022-05-27 MED ORDER — POLYETHYLENE GLYCOL 3350 17 G PO PACK
17.0000 g | PACK | Freq: Two times a day (BID) | ORAL | Status: DC | PRN
  Filled 2022-05-27: qty 1

## 2022-05-30 DIAGNOSIS — J441 Chronic obstructive pulmonary disease with (acute) exacerbation: Secondary | ICD-10-CM | POA: Diagnosis not present

## 2022-06-01 ENCOUNTER — Telehealth (HOSPITAL_COMMUNITY): Payer: Self-pay

## 2022-06-01 NOTE — Telephone Encounter (Signed)
Called to reschedule osteocool. Pt will call me back next week to get rescheduled. He is waiting to get his catheter removed.AW

## 2022-06-04 ENCOUNTER — Telehealth: Payer: Self-pay | Admitting: *Deleted

## 2022-06-04 ENCOUNTER — Other Ambulatory Visit: Payer: Self-pay | Admitting: Oncology

## 2022-06-04 DIAGNOSIS — C652 Malignant neoplasm of left renal pelvis: Secondary | ICD-10-CM

## 2022-06-04 MED ORDER — OXYCODONE-ACETAMINOPHEN 10-325 MG PO TABS: 1.0000 | ORAL_TABLET | Freq: Four times a day (QID) | ORAL | 0 refills | Status: DC | PRN

## 2022-06-04 NOTE — Telephone Encounter (Signed)
Mr Bottcher needs a refill of Oxycodone sent to [facility] on [address]

## 2022-06-05 NOTE — Progress Notes (Unsigned)
Cardiology Office Note:    Date:  06/06/2022   ID:  Gregory Christensen, DOB Mar 07, 1951, MRN [id number]  PCP:  Amelia Jo, [place] [facility] Cardiologist:  Werner Lean, MD     Referring MD: Vernie Shanks, MD   CC: Chest tightness f/u  History of Present Illness:    Gregory Christensen is a 71 y.o. male with a hx of CAD, HTN with DM, COPD with active tobacco abuse on Home O2, CPE and DVT on IDA and hx of GI bleed who presents as a new patient 05/12/21.  2022: Echo low normal RV otherwise WNL had melena 2023: Had GI bleed again, had CP again, AKI on CKD.  Started elqiuis.  LDL was at goal at [facility]  he is back to being smoke free.  Has EGD and eliquis was held. Did not get PET MPI done.  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  He just got out of the hospital Sunday.  Has bladder and had catheter out.  He has some dizziness. He has not taken his medication today.  His blood pressure was not able to be taken by CMA.     No chest pain or pressure .  Hasn't needed nitro.  No PND/Orthopnea.  No weight gain or leg swelling.  Breathing is the same. No palpitations or syncope.  He has noted no active bleeding but noted black stools for a couple of weeks.   Past Medical History:  Diagnosis Date   Acute deep vein thrombosis (DVT) of left lower extremity (HCC)    Acute hypoxemic respiratory failure (HCC)    Anemia    Iron deficiency   Anxiety    Aortic atherosclerosis (HCC)    Arthritis    Benign prostatic hyperplasia 08/28/2018   UNSPECIFIED WHETHER LOWER URINARY TRACT SYMPTOMS PRESENT   CAD (coronary artery disease)    Cancer (HCC)    Chronic back pain    Chronic hip pain    CKD stage 3a 03/19/2021   Constipation    due to Iron M-W-F   COPD exacerbation ([place]) 10/18/2017   Dyspnea    Emphysema lung ([place])    Essential hypertension    GERD (gastroesophageal reflux disease)    Gout    Gross hematuria  09/08/2018   Head injury, acute, with loss of consciousness ([place])    unsure how long he was unresponsive   History of blood transfusion 05/2019   History of kidney stones    History of tobacco abuse    Hypoxia    Insomnia    Oxygen dependent    2l- 24/7   Pneumonia    in 1980's   Pre-diabetes    Pulmonary embolism ([place]) 05/31/2018   Renal mass    CONCERNING FOR RENAL CELL CARCINOMA DR. Lovena Neighbours   Respiratory distress    Respiratory failure, acute-on-chronic ([place]) 08/03/2018   Seasonal allergies    Sleep apnea     Past Surgical History:  Procedure Laterality Date   BIOPSY  06/19/2021   Procedure: BIOPSY;  Surgeon: Wilford Corner, MD;  Location: [facility];  Service: Endoscopy;;   COLONOSCOPY WITH PROPOFOL N/A 09/27/2019   Procedure: COLONOSCOPY WITH PROPOFOL;  Surgeon: Irene Shipper, MD;  Location: [facility];  Service: Endoscopy;  Laterality: N/A;   CYSTOSCOPY/URETEROSCOPY/HOLMIUM LASER/STENT PLACEMENT Left 10/31/2018   Procedure: CYSTOSCOPY/URETEROSCOPY;  Surgeon: Ceasar Mons, MD;  Location: [facility];  Service: Urology;  Laterality: Left;   ESOPHAGOGASTRODUODENOSCOPY (EGD) WITH PROPOFOL N/A 09/27/2019   Procedure: ESOPHAGOGASTRODUODENOSCOPY (EGD) WITH PROPOFOL;  Surgeon: Irene Shipper, MD;  [facility];  Service: Endoscopy;  Laterality: N/A;   ESOPHAGOGASTRODUODENOSCOPY (EGD) WITH PROPOFOL N/A 06/19/2021   Procedure: ESOPHAGOGASTRODUODENOSCOPY (EGD) WITH PROPOFOL;  Surgeon: Wilford Corner, MD;  Location: [place];  Service: Endoscopy;  Laterality: N/A;   GIVENS CAPSULE STUDY N/A 06/20/2021   Procedure: GIVENS CAPSULE STUDY;  Surgeon: Wilford Corner, MD;  Location: [facility];  Service: Endoscopy;  Laterality: N/A;   HERNIA REPAIR Left 1977   inguinal   HYDROCELE EXCISION Left 05/13/2019   Procedure: HYDROCELECTOMY ADULT;  Surgeon: Ceasar Mons, MD;  [facility];  Service: Urology;  Laterality: Left;   [name]    EXTENSIVE   POLYPECTOMY  09/27/2019   Procedure: POLYPECTOMY;  Surgeon: Irene Shipper, MD;  [facility];  Service: Endoscopy;;   ROBOT ASSITED LAPAROSCOPIC NEPHROURETERECTOMY Left 10/31/2018   Procedure: XI ROBOT ASSITED LAPAROSCOPIC NEPHROURETERECTOMY;  Surgeon: Ceasar Mons, MD;  [facility];  Service: Urology;  Laterality: Left;  ONLY NEEDS 240 MIN FOR ALL PROCEDURES   SHOULDER ARTHROSCOPY WITH ROTATOR CUFF REPAIR Left 04/12/2020   Procedure: LEFT SHOULDER ARTHROSCOPY, DEBRIDEMENT, BICEPS TENODESIS, MINI OPEN ROTATOR CUFF TEAR REPAIR;  Surgeon: Meredith Pel, MD;  Location: [facility];  Service: Orthopedics;  Laterality: Left;    Current Medications: Current Meds  Medication Sig   albuterol (PROVENTIL) (2.5 MG/3ML) 0.083% nebulizer solution Take 3 mLs (2.5 mg total) by nebulization every 6 (six) hours as needed for wheezing or shortness of breath.   albuterol (VENTOLIN HFA) 108 (90 Base) MCG/ACT inhaler Inhale 2 puffs into the lungs every 4 (four) hours as needed for wheezing or shortness of breath.   ammonium lactate (LAC-HYDRIN) 12 % lotion Apply 1 Application topically as needed for dry skin.   apixaban (ELIQUIS) 5 MG TABS tablet Take 1 tablet (5 mg total) by mouth 2 (two) times daily.   ascorbic acid (VITAMIN C) 500 MG tablet Take 500 mg by mouth in the morning and at bedtime.   [START ON 06/09/2022] atorvastatin (LIPITOR) 80 MG tablet Take 1 tablet (80 mg total) by mouth at bedtime.   buPROPion (WELLBUTRIN SR) 150 MG 12 hr tablet Take 150 mg by mouth 2 (two) times daily.   diclofenac Sodium (VOLTAREN) 1 % GEL Apply 4 g topically 2 (two) times daily as needed (Pain).   docusate sodium (COLACE) 100 MG capsule Take 100 mg by mouth 2 (two) times daily.   ezetimibe (ZETIA) 10 MG tablet Take 1 tablet (10 mg total) by mouth daily.   ferrous sulfate 325 (65 FE) MG tablet Take 1 tablet (325 mg total) by mouth daily. (Patient taking differently: Take 325 mg by mouth  every Monday, Wednesday, and Friday.)   finasteride (PROSCAR) 5 MG tablet Take 5 mg by mouth daily.   fluticasone (FLONASE) 50 MCG/ACT nasal spray Place 1 spray into both nostrils as needed for allergies or rhinitis.   fluticasone-salmeterol (WIXELA INHUB) 250-50 MCG/ACT AEPB Inhale 1 puff into the lungs in the morning and at bedtime.   gabapentin (NEURONTIN) 300 MG capsule Take 300-600 mg by mouth See admin instructions. Taking 300 mg in the AM and 600 mg at bedtime.   levocetirizine (XYZAL) 5 MG tablet Take 5 mg by mouth daily as needed for allergies.   lidocaine (LIDODERM) 5 % Place 1 patch onto the skin every 12 (twelve) hours as  needed (pain).   meclizine (ANTIVERT) 25 MG tablet Take 25 mg by mouth 3 (three) times daily as needed for dizziness.   methenamine (MANDELAMINE) 1 g tablet Take 1,000 mg by mouth 2 (two) times daily.   metoprolol succinate (TOPROL XL) 25 MG 24 hr tablet Take 1 tablet (25 mg total) by mouth at bedtime.   montelukast (SINGULAIR) 10 MG tablet Take 10 mg by mouth at bedtime.   naloxone (NARCAN) nasal spray 4 mg/0.1 mL Place 1 spray into the nose as needed (opioid overdose).   nicotine (NICODERM CQ - DOSED IN MG/24 HOURS) 21 mg/24hr patch Place 1 patch (21 mg total) onto the skin daily.   nitroGLYCERIN (NITROSTAT) 0.4 MG SL tablet DISSOLVE ONE TABLET UNDER THE TONGUE EVERY 5 MINUTES AS NEEDED FOR CHEST PAIN.  DO NOT EXCEED A TOTAL OF 3 DOSES IN 15 MINUTES NOW   oxyCODONE-acetaminophen (PERCOCET) 10-325 MG tablet Take 1 tablet by mouth every 6 (six) hours as needed for pain.   pantoprazole (PROTONIX) 40 MG tablet Take 1 tablet (40 mg total) by mouth 2 (two) times daily.   senna-docusate (SENOKOT-S) 8.6-50 MG tablet Take 1 tablet by mouth 2 (two) times daily as needed for moderate constipation.   sildenafil (VIAGRA) 100 MG tablet Take 100 mg by mouth daily as needed for erectile dysfunction.   tamsulosin (FLOMAX) 0.4 MG CAPS capsule Take 0.4 mg by mouth at bedtime.    Tiotropium Bromide Monohydrate (SPIRIVA RESPIMAT) 2.5 MCG/ACT AERS Inhale 2 puffs into the lungs daily.   [DISCONTINUED] diazepam (VALIUM) 5 MG tablet Take the day of MRI as needed     Allergies:   Patient has no known allergies.   Social History   Socioeconomic History   Marital status: Divorced    Spouse name: Not on file   Number of children: Not on file   Years of education: Not on file   Highest education level: Not on file  Occupational History   Not on file  Tobacco Use   Smoking status: Some Days    Packs/day: 0.50    Years: 57.00    Total pack years: 28.50    Types: Cigarettes   Smokeless tobacco: Never  Vaping Use   Vaping Use: Never used  Substance and Sexual Activity   Alcohol use: Yes    Alcohol/week: 10.0 standard drinks of alcohol    Types: 10 Glasses of wine per week   Drug use: Not Currently    Types: Flunitrazepam   Sexual activity: Not on file  Other Topics Concern   Not on file  Social History Narrative   Not on file   Social Determinants of Health   Financial Resource Strain: Not on file  Food Insecurity: Not on file  Transportation Needs: Not on file  Physical Activity: Not on file  Stress: Not on file  Social Connections: Not on file    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  ROS:   Please see the history of present illness.     All other systems reviewed and are negative.  EKGs/Labs/Other Studies Reviewed:    The following studies were reviewed today:  EKG:   05/12/21: SR rate 82 Low voltage with baseline artifact  Transthoracic Echocardiogram: Date: 03/20/21 Results:  1. Left ventricular ejection fraction, by estimation, is 60 to 65%. The  left ventricle has normal function. The left  ventricle has no regional  wall motion abnormalities. Left ventricular diastolic parameters were  normal.   2. Right  ventricular systolic function is low normal. The right  ventricular size is mildly enlarged.   3. The mitral valve is normal in structure. Trivial mitral valve  regurgitation.   4. The aortic valve is normal in structure. Aortic valve regurgitation is  not visualized.   NonCardiac CT: Date: 08/02/18 Results: Mild CAC and Aortic Athero IMPRESSION: 1. Resolution of previously noted right upper lobe lobar pulmonary embolus. No large central pulmonary embolus is identified. 2. Panlobular emphysema with new areas of subsegmental atelectasis and/or scarring in the right upper lobe and right middle lobe. 3. Respiratory motion and left arm streak artifact simulating pulmonary emboli to the right lower lobe are identified. No acute pulmonary embolus is currently noted.   Aortic Atherosclerosis (ICD10-I70.0) and Emphysema (ICD10-J43.9).  ECG or NM Stress Testing : Date: 10/17/18 Results: Nuclear stress EF: 71%. Normal perfusion with minimal soft tissue attenuation. No ischemia or scar This is a low risk study.     Recent Labs: 10/18/2021: B Natriuretic Peptide 9.9 05/27/2022: ALT 25; BUN 30; Creatinine, Ser 1.95; Hemoglobin 11.1; Magnesium 2.8; Platelets 190; Potassium 4.0; Sodium 139  Recent Lipid Panel    Component Value Date/Time   CHOL 141 08/15/2021 1007   TRIG 148 08/15/2021 1007   HDL 52 08/15/2021 1007   CHOLHDL 2.7 08/15/2021 1007   LDLCALC 64 08/15/2021 1007       Physical Exam:    VS:  Pulse 90   Ht '6\' 1"'$  (1.854 m)   Wt 155 lb 12.8 oz (70.7 kg)   SpO2 99%   BMI 20.56 kg/m     BP 80/60  Wt Readings from Last 3 Encounters:  06/06/22 155 lb 12.8 oz (70.7 kg)  05/24/22 165 lb (74.8 kg)  05/21/22 165 lb 1.6 oz (74.9 kg)    Gen: No distress Neck: No JVD Ears: Bilateral Frank Sign Cardiac: No Rubs or Gallops, no murmur, normal rate , +2 radial pulses; No RV heave Respiratory: No wheezes, normal  respiratory rate GI: Soft, nontender, non-distended  MS: No  edema;   moves all extremities Integument: Skin feels warm Neuro:  At time of evaluation, alert and oriented to person/place/time/situation  Psych: Normal affect, patient feels well  ASSESSMENT:    1. Hypotension, unspecified hypotension type   2. Other chronic pulmonary embolism without acute cor pulmonale (HCC)   3. Gastrointestinal hemorrhage, unspecified gastrointestinal hemorrhage type     PLAN:    Symptomatic Hypotension HX of PE and DVT on Eliquis IDA and hx of GI bleed small bowel AVMs with bleed in 2022 and 2023 - We will get him to the hospital; he will need a CBC given his blood; stop DOAC until PCI/GI gives the OK  Coronary artery calcifications; aortic atherosclerosis HTN with DM Former Tobacco Abuse COPD  on home O2  - hold BB   He will need ED Eval for BP of 80/60.  EMS report given.  Will need CBC and IVF    Medication Adjustments/Labs and Tests Ordered: Current medicines are reviewed at length with the patient today.  Concerns regarding medicines are outlined above.  No orders of the defined types were placed in this encounter.   No orders of the defined types were placed in this encounter.    Patient Instructions  Medication Instructions:  Your physician has recommended you make the following change in your medication:  HOLD: Metoprolol  *  If you need a refill on your cardiac medications before your next appointment, please call your pharmacy*   Lab Work: NONE If you have labs (blood work) drawn today and your tests are completely normal, you will receive your results only by: [place] (if you have MyChart) OR A paper copy in the mail If you have any lab test that is abnormal or we need to change your treatment, we will call you to review the results.   Testing/Procedures: Your physician has requested that you go to the ED.    Follow-Up:To be determined At [facility], you and your health needs are our priority.  As part of our  continuing mission to provide you with exceptional heart care, we have created designated Provider Care Teams.  These Care Teams include your primary Cardiologist (physician) and Advanced Practice Providers (APPs -  Physician Assistants and Nurse Practitioners) who all work together to provide you with the care you need, when you need it.  We recommend signing up for the patient portal called "MyChart".  Sign up information is provided on this After Visit Summary.  MyChart is used to connect with patients for Virtual Visits (Telemedicine).  Patients are able to view lab/test results, encounter notes, upcoming appointments, etc.  Non-urgent messages can be sent to your provider as well.   To learn more about what you can do with MyChart, go to [email or web address].     Provider:   Werner Lean, MD      Important Information About Sugar         Signed, Werner Lean, MD  06/06/2022 11:02 AM    [place]

## 2022-06-06 ENCOUNTER — Emergency Department (HOSPITAL_COMMUNITY)
Admission: EM | Admit: 2022-06-06 | Discharge: 2022-06-06 | Disposition: A | Discharge status: Home / Self Care | Payer: Medicare Other | Attending: Emergency Medicine | Admitting: Emergency Medicine

## 2022-06-06 ENCOUNTER — Emergency Department (HOSPITAL_COMMUNITY): Payer: Medicare Other

## 2022-06-06 ENCOUNTER — Encounter: Payer: Self-pay | Admitting: Internal Medicine

## 2022-06-06 ENCOUNTER — Other Ambulatory Visit: Payer: Self-pay

## 2022-06-06 ENCOUNTER — Ambulatory Visit: Payer: Medicare Other | Attending: Internal Medicine | Admitting: Internal Medicine

## 2022-06-06 VITALS — BP 80/60 | HR 90 | Ht 73.0 in | Wt 155.8 lb

## 2022-06-06 DIAGNOSIS — I509 Heart failure, unspecified: Secondary | ICD-10-CM | POA: Diagnosis not present

## 2022-06-06 DIAGNOSIS — R339 Retention of urine, unspecified: Secondary | ICD-10-CM | POA: Insufficient documentation

## 2022-06-06 DIAGNOSIS — Z7951 Long term (current) use of inhaled steroids: Secondary | ICD-10-CM | POA: Insufficient documentation

## 2022-06-06 DIAGNOSIS — R0602 Shortness of breath: Secondary | ICD-10-CM | POA: Diagnosis not present

## 2022-06-06 DIAGNOSIS — J449 Chronic obstructive pulmonary disease, unspecified: Secondary | ICD-10-CM | POA: Insufficient documentation

## 2022-06-06 DIAGNOSIS — R0789 Other chest pain: Secondary | ICD-10-CM | POA: Diagnosis not present

## 2022-06-06 DIAGNOSIS — Z87891 Personal history of nicotine dependence: Secondary | ICD-10-CM | POA: Insufficient documentation

## 2022-06-06 DIAGNOSIS — Z7901 Long term (current) use of anticoagulants: Secondary | ICD-10-CM | POA: Diagnosis not present

## 2022-06-06 DIAGNOSIS — I959 Hypotension, unspecified: Secondary | ICD-10-CM | POA: Diagnosis not present

## 2022-06-06 DIAGNOSIS — Z743 Need for continuous supervision: Secondary | ICD-10-CM | POA: Diagnosis not present

## 2022-06-06 DIAGNOSIS — I2782 Chronic pulmonary embolism: Secondary | ICD-10-CM

## 2022-06-06 DIAGNOSIS — K922 Gastrointestinal hemorrhage, unspecified: Secondary | ICD-10-CM | POA: Diagnosis not present

## 2022-06-06 DIAGNOSIS — R6889 Other general symptoms and signs: Secondary | ICD-10-CM | POA: Diagnosis not present

## 2022-06-06 DIAGNOSIS — K5900 Constipation, unspecified: Secondary | ICD-10-CM | POA: Diagnosis not present

## 2022-06-06 DIAGNOSIS — I499 Cardiac arrhythmia, unspecified: Secondary | ICD-10-CM | POA: Diagnosis not present

## 2022-06-06 DIAGNOSIS — J9811 Atelectasis: Secondary | ICD-10-CM | POA: Diagnosis not present

## 2022-06-06 LAB — TROPONIN I (HIGH SENSITIVITY): Troponin I (High Sensitivity): 10 ng/L (ref <18)

## 2022-06-06 LAB — CBC WITH DIFFERENTIAL/PLATELET
Abs Immature Granulocytes: 0.07 10*3/uL (ref 0.00–0.07)
Basophils Absolute: 0 10*3/uL (ref 0.0–0.1)
Basophils Relative: 0 %
Eosinophils Absolute: 0.1 10*3/uL (ref 0.0–0.5)
Eosinophils Relative: 1 %
HCT: 37.2 % — ABNORMAL LOW (ref 39.0–52.0)
Hemoglobin: 11.7 g/dL — ABNORMAL LOW (ref 13.0–17.0)
Immature Granulocytes: 1 %
Lymphocytes Relative: 11 %
Lymphs Abs: 1.1 10*3/uL (ref 0.7–4.0)
MCH: 28.6 pg (ref 26.0–34.0)
MCHC: 31.5 g/dL (ref 30.0–36.0)
MCV: 91 fL (ref 80.0–100.0)
Monocytes Absolute: 1 10*3/uL (ref 0.1–1.0)
Monocytes Relative: 10 %
Neutro Abs: 7.9 10*3/uL — ABNORMAL HIGH (ref 1.7–7.7)
Neutrophils Relative %: 77 %
Platelets: 166 10*3/uL (ref 150–400)
RBC: 4.09 MIL/uL — ABNORMAL LOW (ref 4.22–5.81)
RDW: 14.6 % (ref 11.5–15.5)
WBC: 10.2 10*3/uL (ref 4.0–10.5)
nRBC: 0 % (ref 0.0–0.2)

## 2022-06-06 LAB — BASIC METABOLIC PANEL
Anion gap: 8 (ref 5–15)
BUN: 18 mg/dL (ref 8–23)
CO2: 23 mmol/L (ref 22–32)
Calcium: 8.8 mg/dL — ABNORMAL LOW (ref 8.9–10.3)
Chloride: 105 mmol/L (ref 98–111)
Creatinine, Ser: 1.96 mg/dL — ABNORMAL HIGH (ref 0.61–1.24)
GFR, Estimated: 36 mL/min — ABNORMAL LOW (ref >60)
Glucose, Bld: 80 mg/dL (ref 70–99)
Potassium: 3.9 mmol/L (ref 3.5–5.1)
Sodium: 136 mmol/L (ref 135–145)

## 2022-06-06 LAB — BRAIN NATRIURETIC PEPTIDE: B Natriuretic Peptide: 22.7 pg/mL (ref 0.0–100.0)

## 2022-06-06 MED ORDER — IPRATROPIUM-ALBUTEROL 0.5-2.5 (3) MG/3ML IN SOLN
3.0000 mL | Freq: Once | RESPIRATORY_TRACT | Status: AC
  Administered 2022-06-06: 3 mL via RESPIRATORY_TRACT
  Filled 2022-06-06: qty 3

## 2022-06-06 MED ORDER — SODIUM CHLORIDE 0.9 % IV BOLUS
500.0000 mL | Freq: Once | INTRAVENOUS | Status: AC
  Administered 2022-06-06: 500 mL via INTRAVENOUS

## 2022-06-06 NOTE — ED Notes (Signed)
RN gave pt water

## 2022-06-06 NOTE — ED Triage Notes (Signed)
Pt BIB EMS due to hypotension. Pt also has been having black stools. Pt was at heart doctor this AM and was found hypotensive in 80's. Pt is axox4. Pt wears 2L at baseline.

## 2022-06-06 NOTE — ED Notes (Signed)
Pt refuses to wear gown at this time

## 2022-06-06 NOTE — Patient Instructions (Signed)
Medication Instructions:  Your physician has recommended you make the following change in your medication:  HOLD: Metoprolol  *If you need a refill on your cardiac medications before your next appointment, please call your pharmacy*   Lab Work: NONE If you have labs (blood work) drawn today and your tests are completely normal, you will receive your results only by: [place] (if you have MyChart) OR A paper copy in the mail If you have any lab test that is abnormal or we need to change your treatment, we will call you to review the results.   Testing/Procedures: Your physician has requested that you go to the ED.    Follow-Up:To be determined At [facility], you and your health needs are our priority.  As part of our continuing mission to provide you with exceptional heart care, we have created designated Provider Care Teams.  These Care Teams include your primary Cardiologist (physician) and Advanced Practice Providers (APPs -  Physician Assistants and Nurse Practitioners) who all work together to provide you with the care you need, when you need it.  We recommend signing up for the patient portal called "MyChart".  Sign up information is provided on this After Visit Summary.  MyChart is used to connect with patients for Virtual Visits (Telemedicine).  Patients are able to view lab/test results, encounter notes, upcoming appointments, etc.  Non-urgent messages can be sent to your provider as well.   To learn more about what you can do with MyChart, go to [email or web address].     Provider:   Werner Lean, MD      Important Information About Sugar

## 2022-06-06 NOTE — ED Provider Notes (Signed)
Pt able to urinate at 4:30 pm without problems.  Pt stable for discharge   Sidney Ace 06/06/22 1640    Wyvonnia Dusky, MD 06/07/22 [phone]

## 2022-06-06 NOTE — ED Notes (Addendum)
Pt able to urinate successfully

## 2022-06-06 NOTE — ED Provider Notes (Signed)
[facility]ERGENCY DEPARTMENT Provider Note   CSN: [id number] Arrival date & time: 06/06/22  1129     History  Chief Complaint  Patient presents with   Hypotension    Gregory Christensen is a 71 y.o. male with history of DVT and PE, on Eliquis, COPD on 2 L nasal cannula, congestive heart failure, presenting by EMS from the cardiology office with concern for hypotension and dark stools.  Patient reports that he has felt dizzy or the past several days, his blood pressure was low, per external record review was 80/60 in the cardiology office.  Reports he drinks "plenty of water".  He reports he had some dark stools or bloody stools a few days ago but that is largely resolved.  He reports a history of GI bleed.  External records show the patient had an endoscopy most recently December 2022, one year ago, which showed upper GI AVMs, felt to be a source of GI bleeding at the time.  External records show the patient was also discharged from the hospital approximately 10 days ago,05/26/22, after medical admission for shortness of breath and indigestion, found to have COPD exacerbation.  In the hospital he developed acute urinary retention and a mild AKI which was relieved with a Foley catheter insertion.  He reports he had a follow-up appointment with urology today to have the Foley catheter removed.  He has a history of smoking, was given nebulizer treatments and a steroid course upon discharge from the hospital.  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  HPI     Home Medications Prior to Admission medications   Medication Sig Start Date End Date Taking? Authorizing Provider  albuterol (PROVENTIL) (2.5 MG/3ML) 0.083% nebulizer solution Take 3 mLs (2.5 mg total) by nebulization every 6 (six) hours as needed for wheezing or shortness of breath. 10/31/21   Freddi Starr, MD  albuterol (VENTOLIN HFA) 108 (90 Base) MCG/ACT inhaler Inhale 2 puffs into the lungs every 4 (four) hours as needed for wheezing or shortness of breath. 10/23/21   Aline August, MD  ammonium lactate (LAC-HYDRIN) 12 % lotion Apply 1 Application topically as needed for dry skin.    [provider]  apixaban (ELIQUIS) 5 MG TABS tablet Take 1 tablet (5 mg total) by mouth 2 (two) times daily. 06/10/18   Regalado, Belkys A, MD  ascorbic acid (VITAMIN C) 500 MG tablet Take 500 mg by mouth in the morning and at bedtime. 05/10/21   [provider]  atorvastatin (LIPITOR) 80 MG tablet Take 1 tablet (80 mg total) by mouth at bedtime. 06/09/22   Mercy Riding, MD  buPROPion (WELLBUTRIN SR) 150 MG 12 hr tablet Take 150 mg by mouth 2 (two) times daily. 11/09/21   [provider]  diclofenac Sodium (VOLTAREN) 1 % GEL Apply 4 g topically 2 (two) times daily as needed (Pain).    [provider]  docusate sodium (COLACE) 100 MG capsule Take 100 mg by mouth 2 (two) times daily.    [provider]  ezetimibe (ZETIA) 10 MG tablet Take 1 tablet (10 mg total) by mouth daily. 08/18/21   Chandrasekhar, Lyda Kalata A, MD  ferrous sulfate 325 (65 FE) MG tablet Take 1 tablet (325 mg total) by mouth daily. Patient taking differently: Take 325 mg by mouth every Monday, Wednesday, and Friday. 06/21/21 06/21/22  Shelly Coss, MD  finasteride (  PROSCAR) 5 MG tablet Take 5 mg by mouth daily.    [provider]  fluticasone (FLONASE) 50 MCG/ACT nasal spray Place 1 spray into both nostrils as needed for allergies or rhinitis.    [provider]  fluticasone-salmeterol (WIXELA INHUB) 250-50 MCG/ACT AEPB Inhale 1 puff into the lungs in the morning and at bedtime.    [provider]  gabapentin (NEURONTIN) 300 MG capsule Take 300-600 mg by mouth See admin instructions. Taking 300 mg in the AM and 600 mg at bedtime.    [provider]  levocetirizine  (XYZAL) 5 MG tablet Take 5 mg by mouth daily as needed for allergies.    [provider]  lidocaine (LIDODERM) 5 % Place 1 patch onto the skin every 12 (twelve) hours as needed (pain).    [provider]  meclizine (ANTIVERT) 25 MG tablet Take 25 mg by mouth 3 (three) times daily as needed for dizziness.    [provider]  methenamine (MANDELAMINE) 1 g tablet Take 1,000 mg by mouth 2 (two) times daily.    [provider]  metoprolol succinate (TOPROL XL) 25 MG 24 hr tablet Take 1 tablet (25 mg total) by mouth at bedtime. 10/31/21 11/01/22  Swinyer, Lanice Schwab, NP  montelukast (SINGULAIR) 10 MG tablet Take 10 mg by mouth at bedtime.    [provider]  naloxone [facility]) nasal spray 4 mg/0.1 mL Place 1 spray into the nose as needed (opioid overdose).    [provider]  nicotine (NICODERM CQ - DOSED IN MG/24 HOURS) 21 mg/24hr patch Place 1 patch (21 mg total) onto the skin daily. 10/23/21   Aline August, MD  nitroGLYCERIN (NITROSTAT) 0.4 MG SL tablet DISSOLVE ONE TABLET UNDER THE TONGUE EVERY 5 MINUTES AS NEEDED FOR CHEST PAIN.  DO NOT EXCEED A TOTAL OF 3 DOSES IN 15 MINUTES NOW 12/05/21   Rudean Haskell A, MD  oxyCODONE-acetaminophen (PERCOCET) 10-325 MG tablet Take 1 tablet by mouth every 6 (six) hours as needed for pain. 06/04/22   Wyatt Portela, MD  pantoprazole (PROTONIX) 40 MG tablet Take 1 tablet (40 mg total) by mouth 2 (two) times daily. 10/23/21 10/23/22  Aline August, MD  senna-docusate (SENOKOT-S) 8.6-50 MG tablet Take 1 tablet by mouth 2 (two) times daily as needed for moderate constipation. 05/27/22   Mercy Riding, MD  sildenafil (VIAGRA) 100 MG tablet Take 100 mg by mouth daily as needed for erectile dysfunction. 01/25/20   [provider]  tamsulosin (FLOMAX) 0.4 MG CAPS capsule Take 0.4 mg by mouth at bedtime.    [provider]  Tiotropium Bromide Monohydrate (SPIRIVA RESPIMAT) 2.5 MCG/ACT AERS Inhale 2 puffs  into the lungs daily.    [provider]      Allergies    Patient has no known allergies.    Review of Systems   Review of Systems  Physical Exam Updated Vital Signs BP (!) 141/70   Pulse 78   Temp (!) 97.5 F (36.4 C) (Oral)   Resp (!) 28   Ht '6\' 1"'$  (1.854 m)   Wt 70.3 kg   SpO2 98%   BMI 20.45 kg/m  Physical Exam Constitutional:      General: He is not in acute distress. HENT:     Head: Normocephalic and atraumatic.  Eyes:     Conjunctiva/sclera: Conjunctivae normal.     Pupils: Pupils are equal, round, and reactive to light.  Cardiovascular:     Rate and  Rhythm: Normal rate and regular rhythm.  Pulmonary:     Effort: Pulmonary effort is normal. No respiratory distress.     Comments: Patient on baseline 2 L nasal cannula, speaking comfortably full sentences, expiratory wheezing bilaterally Abdominal:     General: There is no distension.     Tenderness: There is no abdominal tenderness.  Genitourinary:    Comments: Foley catheter in place Musculoskeletal:     Right lower leg: No edema.     Left lower leg: No edema.  Skin:    General: Skin is warm and dry.  Neurological:     General: No focal deficit present.     Mental Status: He is alert. Mental status is at baseline.  Psychiatric:        Mood and Affect: Mood normal.        Behavior: Behavior normal.     ED Results / Procedures / Treatments   Labs (all labs ordered are listed, but only abnormal results are displayed) Labs Reviewed  BASIC METABOLIC PANEL - Abnormal; Notable for the following components:      Result Value   Creatinine, Ser 1.96 (*)    Calcium 8.8 (*)    GFR, Estimated 36 (*)    All other components within normal limits  CBC WITH DIFFERENTIAL/PLATELET - Abnormal; Notable for the following components:   RBC 4.09 (*)    Hemoglobin 11.7 (*)    HCT 37.2 (*)    Neutro Abs 7.9 (*)    All other components within normal limits  BRAIN NATRIURETIC PEPTIDE  TYPE AND SCREEN   TROPONIN I (HIGH SENSITIVITY)    EKG EKG Interpretation  Date/Time:  Wednesday June 06 2022 12:24:55 EST Ventricular Rate:  84 PR Interval:  158 QRS Duration: 89 QT Interval:  380 QTC Calculation: 450 R Axis:   81 Text Interpretation: Sinus rhythm Borderline right axis deviation Nonspecific T abnrm, anterolateral leads Confirmed by Octaviano Glow [phone]) on 06/06/2022 12:33:53 PM  Radiology DG Chest Portable 1 View  Result Date: 06/06/2022 CLINICAL DATA:  Provided history: Shortness of breath. EXAM: PORTABLE CHEST 1 VIEW COMPARISON:  Prior chest radiographs 05/24/2022 and earlier. Chest CT 03/26/2022. FINDINGS: Heart size is normal limits. Redemonstrated focus of subpleural consolidation within the periphery of the right upper lobe, more fully characterized on the prior chest CT of 03/26/2022. Minimal atelectasis within the left lung base. No appreciable airspace consolidation. No evidence of pleural effusion or pneumothorax. Degenerative changes of the spine. IMPRESSION: Redemonstrated focus of subpleural consolidation within the periphery of the right upper lobe, more fully characterized on the prior chest CT of 03/26/2022. Please refer to this prior examination for further description and for follow-up recommendations. No appreciable airspace consolidation elsewhere. Minimal atelectasis within the left lung base. Electronically Signed   By: Kellie Simmering D.O.   On: 06/06/2022 13:11    Procedures Procedures    Medications Ordered in ED Medications  sodium chloride 0.9 % bolus 500 mL (0 mLs Intravenous Stopped 06/06/22 1407)  ipratropium-albuterol (DUONEB) 0.5-2.5 (3) MG/3ML nebulizer solution 3 mL (3 mLs Nebulization Given 06/06/22 1215)    ED Course/ Medical Decision Making/ A&P Clinical Course as of 06/06/22 1527  Wed Jun 06, 2022  1326 Labs and blood pressure been unremarkable.  The patient is wanting to void trial.  He was due to do this in the office with a urologist.  I  have asked the nurse to discontinue the Foley and we will ensure that he is able to  urinate. [MT]  1326 Otherwise I do not see any evidence of medical emergency at this time, and I suspect that his hypotension may have been transient [MT]  1447 Pt to void trial here now with foley d/c, if able to urinate can be discharged home. [MT]    Clinical Course User Index [MT] Suheily Birks, Carola Rhine, MD                           Medical Decision Making Amount and/or Complexity of Data Reviewed Labs: ordered. Radiology: ordered. ECG/medicine tests: ordered.  Risk Prescription drug management.   This patient presents to the ED with concern for hypotension. This involves an extensive number of treatment options, and is a complaint that carries with it a high risk of complications and morbidity.  The differential diagnosis includes dehydration versus anemia versus cardiogenic cause versus other.  He appears to have some baseline low blood pressure per my review of external records, with BP 84/55 on 05/26/22 at the time of discharge.  He is not on antihypertensive medications.  Co-morbidities that complicate the patient evaluation: History of congestive heart failure, GI bleed, anemia, at risk of complications of these.  Additional history obtained from EMS  External records from outside source obtained and reviewed including cardiology office note from today, upper endoscopy report from 12/22, hospital discharge summary from 05/26/22   I ordered and personally interpreted labs.  The pertinent results include: No acute emergent findings.  Creatinine at baseline.  I ordered imaging studies including x-ray of the chest I independently visualized and interpreted imaging which showed no acute emergent findings I agree with the radiologist interpretation  The patient was maintained on a cardiac monitor.  I personally viewed and interpreted the cardiac monitored which showed an underlying rhythm of: Sinus  rhythm  Per my interpretation the patient's ECG shows no acute ischemic findings  I ordered medication including DuoNeb for wheezing with suspect is chronic, small fluid bolus for hydration as there is some Yasui elevation of creatinine  I have reviewed the patients home medicines and have made adjustments as needed  Test Considered: doubt acute PE clinically  After the interventions noted above, I reevaluated the patient and found that they have: improved  Dispostion:  Signed out to afternoon providers [facility] pending f/u on trial of void for UA.   After consideration of the diagnostic results and the patients response to treatment, I feel that the patent would benefit from outpatient f/u.         Final Clinical Impression(s) / ED Diagnoses Final diagnoses:  None    Rx / DC Orders ED Discharge Orders     None         Chong Wojdyla, Carola Rhine, MD 06/06/22 1527

## 2022-06-10 ENCOUNTER — Other Ambulatory Visit: Payer: Self-pay

## 2022-06-10 ENCOUNTER — Emergency Department (HOSPITAL_COMMUNITY)
Admission: EM | Admit: 2022-06-10 | Discharge: 2022-06-10 | Disposition: A | Discharge status: Home / Self Care | Payer: Medicare Other | Attending: Emergency Medicine | Admitting: Emergency Medicine

## 2022-06-10 ENCOUNTER — Encounter (HOSPITAL_COMMUNITY): Payer: Self-pay

## 2022-06-10 DIAGNOSIS — Z7951 Long term (current) use of inhaled steroids: Secondary | ICD-10-CM | POA: Insufficient documentation

## 2022-06-10 DIAGNOSIS — R1084 Generalized abdominal pain: Secondary | ICD-10-CM | POA: Diagnosis not present

## 2022-06-10 DIAGNOSIS — R339 Retention of urine, unspecified: Secondary | ICD-10-CM | POA: Diagnosis not present

## 2022-06-10 DIAGNOSIS — R300 Dysuria: Secondary | ICD-10-CM | POA: Diagnosis not present

## 2022-06-10 DIAGNOSIS — R338 Other retention of urine: Secondary | ICD-10-CM | POA: Diagnosis not present

## 2022-06-10 DIAGNOSIS — R109 Unspecified abdominal pain: Secondary | ICD-10-CM | POA: Diagnosis not present

## 2022-06-10 LAB — CBC WITH DIFFERENTIAL/PLATELET
Abs Immature Granulocytes: 0.03 10*3/uL (ref 0.00–0.07)
Basophils Absolute: 0 10*3/uL (ref 0.0–0.1)
Basophils Relative: 0 %
Eosinophils Absolute: 0.1 10*3/uL (ref 0.0–0.5)
Eosinophils Relative: 1 %
HCT: 45.3 % (ref 39.0–52.0)
Hemoglobin: 13.7 g/dL (ref 13.0–17.0)
Immature Granulocytes: 0 %
Lymphocytes Relative: 13 %
Lymphs Abs: 1.3 10*3/uL (ref 0.7–4.0)
MCH: 27.9 pg (ref 26.0–34.0)
MCHC: 30.2 g/dL (ref 30.0–36.0)
MCV: 92.3 fL (ref 80.0–100.0)
Monocytes Absolute: 0.6 10*3/uL (ref 0.1–1.0)
Monocytes Relative: 6 %
Neutro Abs: 7.4 10*3/uL (ref 1.7–7.7)
Neutrophils Relative %: 80 %
Platelets: 231 10*3/uL (ref 150–400)
RBC: 4.91 MIL/uL (ref 4.22–5.81)
RDW: 14.2 % (ref 11.5–15.5)
WBC: 9.4 10*3/uL (ref 4.0–10.5)
nRBC: 0 % (ref 0.0–0.2)

## 2022-06-10 LAB — TYPE AND SCREEN
ABO/RH(D): B POS
Antibody Screen: POSITIVE
Donor AG Type: NEGATIVE
Donor AG Type: NEGATIVE
Unit division: 0
Unit division: 0

## 2022-06-10 LAB — BASIC METABOLIC PANEL
Anion gap: 9 (ref 5–15)
BUN: 15 mg/dL (ref 8–23)
CO2: 21 mmol/L — ABNORMAL LOW (ref 22–32)
Calcium: 9.7 mg/dL (ref 8.9–10.3)
Chloride: 108 mmol/L (ref 98–111)
Creatinine, Ser: 1.55 mg/dL — ABNORMAL HIGH (ref 0.61–1.24)
GFR, Estimated: 48 mL/min — ABNORMAL LOW (ref >60)
Glucose, Bld: 94 mg/dL (ref 70–99)
Potassium: 5.4 mmol/L — ABNORMAL HIGH (ref 3.5–5.1)
Sodium: 138 mmol/L (ref 135–145)

## 2022-06-10 LAB — BPAM RBC
Blood Product Expiration Date: 202312302359
Blood Product Expiration Date: 202401102359
Unit Type and Rh: 7300
Unit Type and Rh: 7300

## 2022-06-10 LAB — URINALYSIS, ROUTINE W REFLEX MICROSCOPIC
Bacteria, UA: NONE SEEN
Bilirubin Urine: NEGATIVE
Glucose, UA: NEGATIVE mg/dL
Ketones, ur: NEGATIVE mg/dL
Leukocytes,Ua: NEGATIVE
Nitrite: NEGATIVE
Protein, ur: 30 mg/dL — AB
RBC / HPF: >50 RBC/hpf — ABNORMAL HIGH (ref 0–5)
Specific Gravity, Urine: 1.018 (ref 1.005–1.030)
pH: 5 (ref 5.0–8.0)

## 2022-06-10 NOTE — ED Triage Notes (Signed)
Pt BIB EMS with c/o flank pain since yesterday. Pain starts at lower right quad and wraps around to right back, do not cross midline. Pt reports having scrotum pain. Painful urination and retention. Denies N/V. Wears 2L of oxygen at baseline. Hx COPD. 148mg fentanyl from EMS  BP 120/88 HR 88 SpO2 100   CBG 97 RR 22

## 2022-06-10 NOTE — ED Notes (Signed)
Pt output 1450 mL. PA notified.

## 2022-06-10 NOTE — Discharge Instructions (Signed)
Get help right away if: You see blood in the catheter. Your urine is pink or red. Your bladder feels full. Your urine is not draining into the bag. Your catheter gets pulled out.

## 2022-06-10 NOTE — ED Provider Notes (Signed)
[facility] DEPT Provider Note   CSN: [id number] Arrival date & time: 06/10/22  0803     History {Add pertinent medical, surgical, social history, OB history to HPI:1} Chief Complaint  Patient presents with   Flank Pain    Gregory Christensen is a 71 y.o. male who presents emergency department chief complaint of acute urinary retention. He has a history of urinary retention.  Patient follows with Dr. Lovena Neighbours at [facility] urology.  The patient reports that he had a catheter removed earlier this week.  He has been having some difficulty getting his urine out.  He was able to push out just a small amount of urine around 3:30 AM but has had severely worsening bladder distention pain and discomfort since that time.   Flank Pain       Home Medications Prior to Admission medications   Medication Sig Start Date End Date Taking? Authorizing Provider  albuterol (PROVENTIL) (2.5 MG/3ML) 0.083% nebulizer solution Take 3 mLs (2.5 mg total) by nebulization every 6 (six) hours as needed for wheezing or shortness of breath. 10/31/21   Freddi Starr, MD  albuterol (VENTOLIN HFA) 108 (90 Base) MCG/ACT inhaler Inhale 2 puffs into the lungs every 4 (four) hours as needed for wheezing or shortness of breath. 10/23/21   Aline August, MD  ammonium lactate (LAC-HYDRIN) 12 % lotion Apply 1 Application topically as needed for dry skin.    [provider]  apixaban (ELIQUIS) 5 MG TABS tablet Take 1 tablet (5 mg total) by mouth 2 (two) times daily. 06/10/18   Regalado, Belkys A, MD  ascorbic acid (VITAMIN C) 500 MG tablet Take 500 mg by mouth in the morning and at bedtime. 05/10/21   [provider]  atorvastatin (LIPITOR) 80 MG tablet Take 1 tablet (80 mg total) by mouth at bedtime. 06/09/22   Mercy Riding, MD  buPROPion (WELLBUTRIN SR) 150 MG 12 hr tablet Take 150 mg by mouth 2 (two) times daily. 11/09/21   [provider]  diclofenac Sodium (VOLTAREN) 1 %  GEL Apply 4 g topically 2 (two) times daily as needed (Pain).    [provider]  docusate sodium (COLACE) 100 MG capsule Take 100 mg by mouth 2 (two) times daily.    [provider]  ezetimibe (ZETIA) 10 MG tablet Take 1 tablet (10 mg total) by mouth daily. 08/18/21   Chandrasekhar, Lyda Kalata A, MD  ferrous sulfate 325 (65 FE) MG tablet Take 1 tablet (325 mg total) by mouth daily. Patient taking differently: Take 325 mg by mouth every Monday, Wednesday, and Friday. 06/21/21 06/21/22  Shelly Coss, MD  finasteride (PROSCAR) 5 MG tablet Take 5 mg by mouth daily.    [provider]  fluticasone (FLONASE) 50 MCG/ACT nasal spray Place 1 spray into both nostrils as needed for allergies or rhinitis.    [provider]  fluticasone-salmeterol (WIXELA INHUB) 250-50 MCG/ACT AEPB Inhale 1 puff into the lungs in the morning and at bedtime.    [provider]  gabapentin (NEURONTIN) 300 MG capsule Take 300-600 mg by mouth See admin instructions. Taking 300 mg in the AM and 600 mg at bedtime.    [provider]  levocetirizine (XYZAL) 5 MG tablet Take 5 mg by mouth daily as needed for allergies.    [provider]  lidocaine (LIDODERM) 5 % Place 1 patch onto the skin every 12 (twelve) hours as needed (pain).    [provider]  meclizine Johnathan Hausen)  25 MG tablet Take 25 mg by mouth 3 (three) times daily as needed for dizziness.    [provider]  methenamine (MANDELAMINE) 1 g tablet Take 1,000 mg by mouth 2 (two) times daily.    [provider]  metoprolol succinate (TOPROL XL) 25 MG 24 hr tablet Take 1 tablet (25 mg total) by mouth at bedtime. 10/31/21 11/01/22  Swinyer, Lanice Schwab, NP  montelukast (SINGULAIR) 10 MG tablet Take 10 mg by mouth at bedtime.    [provider]  naloxone [facility]) nasal spray 4 mg/0.1 mL Place 1 spray into the nose as needed (opioid overdose).    [provider]  nicotine (NICODERM  CQ - DOSED IN MG/24 HOURS) 21 mg/24hr patch Place 1 patch (21 mg total) onto the skin daily. 10/23/21   Aline August, MD  nitroGLYCERIN (NITROSTAT) 0.4 MG SL tablet DISSOLVE ONE TABLET UNDER THE TONGUE EVERY 5 MINUTES AS NEEDED FOR CHEST PAIN.  DO NOT EXCEED A TOTAL OF 3 DOSES IN 15 MINUTES NOW 12/05/21   Rudean Haskell A, MD  oxyCODONE-acetaminophen (PERCOCET) 10-325 MG tablet Take 1 tablet by mouth every 6 (six) hours as needed for pain. 06/04/22   Wyatt Portela, MD  pantoprazole (PROTONIX) 40 MG tablet Take 1 tablet (40 mg total) by mouth 2 (two) times daily. 10/23/21 10/23/22  Aline August, MD  senna-docusate (SENOKOT-S) 8.6-50 MG tablet Take 1 tablet by mouth 2 (two) times daily as needed for moderate constipation. 05/27/22   Mercy Riding, MD  sildenafil (VIAGRA) 100 MG tablet Take 100 mg by mouth daily as needed for erectile dysfunction. 01/25/20   [provider]  tamsulosin (FLOMAX) 0.4 MG CAPS capsule Take 0.4 mg by mouth at bedtime.    [provider]  Tiotropium Bromide Monohydrate (SPIRIVA RESPIMAT) 2.5 MCG/ACT AERS Inhale 2 puffs into the lungs daily.    [provider]      Allergies    Patient has no known allergies.    Review of Systems   Review of Systems  Genitourinary:  Positive for flank pain.    Physical Exam Updated Vital Signs BP (!) 134/111   Pulse 96   Temp 97.6 F (36.4 C) (Oral)   Resp 20   Ht '6\' 1"'$  (1.854 m)   Wt 70.3 kg   SpO2 100%   BMI 20.45 kg/m  Physical Exam Vitals and nursing note reviewed.  Constitutional:      General: He is not in acute distress.    Appearance: He is well-developed. He is not diaphoretic.     Comments: Patient appears extremely uncomfortable  HENT:     Head: Normocephalic and atraumatic.  Eyes:     General: No scleral icterus.    Conjunctiva/sclera: Conjunctivae normal.  Cardiovascular:     Rate and Rhythm: Normal rate and regular rhythm.     Heart sounds: Normal heart sounds.   Pulmonary:     Effort: Pulmonary effort is normal. No respiratory distress.     Breath sounds: Normal breath sounds.  Abdominal:     General: There is distension.     Palpations: Abdomen is soft.     Tenderness: There is abdominal tenderness. There is guarding.     Comments: Patient with severe distention of the abdomen.  The fundus of the bladder is palpable midway between the umbilicus and the inferior rib cage  Musculoskeletal:     Cervical back: Normal range of motion and neck supple.  Skin:    General:  Skin is warm and dry.  Neurological:     Mental Status: He is alert.  Psychiatric:        Behavior: Behavior normal.     ED Results / Procedures / Treatments   Labs (all labs ordered are listed, but only abnormal results are displayed) Labs Reviewed - No data to display  EKG None  Radiology No results found.  Procedures BLADDER CATHETERIZATION  Date/Time: 06/10/2022 10:20 AM  Performed by: Margarita Mail, PA-C Authorized by: Margarita Mail, PA-C   Consent:    Consent obtained:  Verbal   Consent given by:  Patient   Risks discussed:  False passage, incomplete procedure, pain, infection and urethral injury   Alternatives discussed:  No treatment Universal protocol:    Patient identity confirmed:  Verbally with patient Pre-procedure details:    Procedure purpose:  Therapeutic   Preparation: Patient was prepped and draped in usual sterile fashion   Anesthesia:    Anesthesia method:  None Procedure details:    Provider performed due to:  Nurse unavailable   Catheter insertion:  Temporary indwelling   Catheter type:  Foley   Catheter size:  16 Fr   Bladder irrigation: no     Number of attempts:  1   Urine characteristics:  Clear Post-procedure details:    Procedure completion:  Tolerated well, no immediate complications   {Document cardiac monitor, telemetry assessment procedure when appropriate:1}  Medications Ordered in ED Medications - No data to  display  ED Course/ Medical Decision Making/ A&P Clinical Course as of 06/10/22 1018  Sun Jun 10, 2022  0841 Flank pain since yesterday.  [CC]  [id number] AUR. S/P Cath [CC]    Clinical Course User Index [CC] Tretha Sciara, MD                           Medical Decision Making Amount and/or Complexity of Data Reviewed Labs: ordered.   71 year old male here with acute urinary retention.  Bladder catheter placed and he put out greater than 1400 mL from the bladder.  He feels immediately better. I personally visualized and reviewed patient's labs.  His potassium was 5.4 it appears that he has had some elevated potassiums in the past however with a creatinine of 1.55 this is likely transient.  His previous 2 potassiums were normal.  This may reflect hemolysis at the lab. After review of all data points he also does not appear to have a urinary tract infection as cause of his acute retention.  CBC is without abnormality.  Creatinine is at baseline.  Patient will be discharged with indwelling catheter, outpatient follow-up with urology.  He appears otherwise appropriate for discharge at this time   {Document critical care time when appropriate:1} {Document review of labs and clinical decision tools ie heart score, Chads2Vasc2 etc:1}  {Document your independent review of radiology images, and any outside records:1} {Document your discussion with family members, caretakers, and with consultants:1} {Document social determinants of health affecting pt's care:1} {Document your decision making why or why not admission, treatments were needed:1} Final Clinical Impression(s) / ED Diagnoses Final diagnoses:  None    Rx / DC Orders ED Discharge Orders     None

## 2022-06-11 DIAGNOSIS — D649 Anemia, unspecified: Secondary | ICD-10-CM | POA: Diagnosis not present

## 2022-06-11 DIAGNOSIS — N1832 Chronic kidney disease, stage 3b: Secondary | ICD-10-CM | POA: Diagnosis not present

## 2022-06-11 DIAGNOSIS — R339 Retention of urine, unspecified: Secondary | ICD-10-CM | POA: Diagnosis not present

## 2022-06-11 DIAGNOSIS — Z6822 Body mass index (BMI) 22.0-22.9, adult: Secondary | ICD-10-CM | POA: Diagnosis not present

## 2022-06-11 DIAGNOSIS — J449 Chronic obstructive pulmonary disease, unspecified: Secondary | ICD-10-CM | POA: Diagnosis not present

## 2022-06-11 DIAGNOSIS — J9611 Chronic respiratory failure with hypoxia: Secondary | ICD-10-CM | POA: Diagnosis not present

## 2022-06-11 DIAGNOSIS — C7951 Secondary malignant neoplasm of bone: Secondary | ICD-10-CM | POA: Diagnosis not present

## 2022-06-12 ENCOUNTER — Telehealth (HOSPITAL_COMMUNITY): Payer: Self-pay

## 2022-06-12 NOTE — Telephone Encounter (Signed)
Called to f/u with pt about schedule osteocool/kp. Pt would like to continue to hold off for now until he has his catheter removed. AW

## 2022-06-15 ENCOUNTER — Other Ambulatory Visit (HOSPITAL_COMMUNITY): Payer: Self-pay | Admitting: Internal Medicine

## 2022-06-15 DIAGNOSIS — M899 Disorder of bone, unspecified: Secondary | ICD-10-CM

## 2022-06-18 DIAGNOSIS — Z905 Acquired absence of kidney: Secondary | ICD-10-CM | POA: Diagnosis not present

## 2022-06-18 DIAGNOSIS — J449 Chronic obstructive pulmonary disease, unspecified: Secondary | ICD-10-CM | POA: Diagnosis not present

## 2022-06-18 DIAGNOSIS — Z85528 Personal history of other malignant neoplasm of kidney: Secondary | ICD-10-CM | POA: Diagnosis not present

## 2022-06-18 DIAGNOSIS — M898X8 Other specified disorders of bone, other site: Secondary | ICD-10-CM | POA: Diagnosis not present

## 2022-06-18 DIAGNOSIS — Z6822 Body mass index (BMI) 22.0-22.9, adult: Secondary | ICD-10-CM | POA: Diagnosis not present

## 2022-06-18 DIAGNOSIS — Z993 Dependence on wheelchair: Secondary | ICD-10-CM | POA: Diagnosis not present

## 2022-06-19 DIAGNOSIS — Z905 Acquired absence of kidney: Secondary | ICD-10-CM | POA: Diagnosis not present

## 2022-06-19 DIAGNOSIS — D630 Anemia in neoplastic disease: Secondary | ICD-10-CM | POA: Diagnosis not present

## 2022-06-19 DIAGNOSIS — D631 Anemia in chronic kidney disease: Secondary | ICD-10-CM | POA: Diagnosis not present

## 2022-06-19 DIAGNOSIS — C7951 Secondary malignant neoplasm of bone: Secondary | ICD-10-CM | POA: Diagnosis not present

## 2022-06-19 DIAGNOSIS — Z9981 Dependence on supplemental oxygen: Secondary | ICD-10-CM | POA: Diagnosis not present

## 2022-06-19 DIAGNOSIS — J9611 Chronic respiratory failure with hypoxia: Secondary | ICD-10-CM | POA: Diagnosis not present

## 2022-06-19 DIAGNOSIS — N1832 Chronic kidney disease, stage 3b: Secondary | ICD-10-CM | POA: Diagnosis not present

## 2022-06-19 DIAGNOSIS — Z86718 Personal history of other venous thrombosis and embolism: Secondary | ICD-10-CM | POA: Diagnosis not present

## 2022-06-19 DIAGNOSIS — I129 Hypertensive chronic kidney disease with stage 1 through stage 4 chronic kidney disease, or unspecified chronic kidney disease: Secondary | ICD-10-CM | POA: Diagnosis not present

## 2022-06-19 DIAGNOSIS — Z466 Encounter for fitting and adjustment of urinary device: Secondary | ICD-10-CM | POA: Diagnosis not present

## 2022-06-19 DIAGNOSIS — R338 Other retention of urine: Secondary | ICD-10-CM | POA: Diagnosis not present

## 2022-06-19 DIAGNOSIS — Z7901 Long term (current) use of anticoagulants: Secondary | ICD-10-CM | POA: Diagnosis not present

## 2022-06-19 DIAGNOSIS — E1122 Type 2 diabetes mellitus with diabetic chronic kidney disease: Secondary | ICD-10-CM | POA: Diagnosis not present

## 2022-06-19 DIAGNOSIS — M150 Primary generalized (osteo)arthritis: Secondary | ICD-10-CM | POA: Diagnosis not present

## 2022-06-19 DIAGNOSIS — J449 Chronic obstructive pulmonary disease, unspecified: Secondary | ICD-10-CM | POA: Diagnosis not present

## 2022-06-19 DIAGNOSIS — Z7951 Long term (current) use of inhaled steroids: Secondary | ICD-10-CM | POA: Diagnosis not present

## 2022-06-19 DIAGNOSIS — I2510 Atherosclerotic heart disease of native coronary artery without angina pectoris: Secondary | ICD-10-CM | POA: Diagnosis not present

## 2022-06-19 DIAGNOSIS — Z87891 Personal history of nicotine dependence: Secondary | ICD-10-CM | POA: Diagnosis not present

## 2022-06-19 DIAGNOSIS — Z8553 Personal history of malignant neoplasm of renal pelvis: Secondary | ICD-10-CM | POA: Diagnosis not present

## 2022-06-19 DIAGNOSIS — Z9181 History of falling: Secondary | ICD-10-CM | POA: Diagnosis not present

## 2022-06-19 DIAGNOSIS — E785 Hyperlipidemia, unspecified: Secondary | ICD-10-CM | POA: Diagnosis not present

## 2022-06-19 DIAGNOSIS — Z86711 Personal history of pulmonary embolism: Secondary | ICD-10-CM | POA: Diagnosis not present

## 2022-06-19 DIAGNOSIS — Z79891 Long term (current) use of opiate analgesic: Secondary | ICD-10-CM | POA: Diagnosis not present

## 2022-06-22 ENCOUNTER — Encounter (HOSPITAL_COMMUNITY): Payer: Self-pay

## 2022-06-22 ENCOUNTER — Emergency Department (HOSPITAL_COMMUNITY): Payer: Medicare Other

## 2022-06-22 ENCOUNTER — Inpatient Hospital Stay (HOSPITAL_COMMUNITY)
Admission: EM | Admit: 2022-06-22 | Discharge: 2022-06-23 | DRG: 683 | Disposition: A | Discharge status: Hospice | Payer: Medicare Other | Attending: Internal Medicine | Admitting: Internal Medicine

## 2022-06-22 DIAGNOSIS — F419 Anxiety disorder, unspecified: Secondary | ICD-10-CM | POA: Diagnosis present

## 2022-06-22 DIAGNOSIS — C652 Malignant neoplasm of left renal pelvis: Secondary | ICD-10-CM | POA: Diagnosis not present

## 2022-06-22 DIAGNOSIS — Z515 Encounter for palliative care: Secondary | ICD-10-CM | POA: Diagnosis not present

## 2022-06-22 DIAGNOSIS — N1831 Chronic kidney disease, stage 3a: Secondary | ICD-10-CM | POA: Diagnosis present

## 2022-06-22 DIAGNOSIS — Z8249 Family history of ischemic heart disease and other diseases of the circulatory system: Secondary | ICD-10-CM

## 2022-06-22 DIAGNOSIS — Z7901 Long term (current) use of anticoagulants: Secondary | ICD-10-CM | POA: Diagnosis not present

## 2022-06-22 DIAGNOSIS — Z8553 Personal history of malignant neoplasm of renal pelvis: Secondary | ICD-10-CM

## 2022-06-22 DIAGNOSIS — E860 Dehydration: Secondary | ICD-10-CM | POA: Diagnosis present

## 2022-06-22 DIAGNOSIS — I2510 Atherosclerotic heart disease of native coronary artery without angina pectoris: Secondary | ICD-10-CM | POA: Diagnosis present

## 2022-06-22 DIAGNOSIS — Z1152 Encounter for screening for COVID-19: Secondary | ICD-10-CM | POA: Diagnosis not present

## 2022-06-22 DIAGNOSIS — Z9981 Dependence on supplemental oxygen: Secondary | ICD-10-CM | POA: Diagnosis not present

## 2022-06-22 DIAGNOSIS — N390 Urinary tract infection, site not specified: Secondary | ICD-10-CM | POA: Insufficient documentation

## 2022-06-22 DIAGNOSIS — N179 Acute kidney failure, unspecified: Principal | ICD-10-CM

## 2022-06-22 DIAGNOSIS — F17210 Nicotine dependence, cigarettes, uncomplicated: Secondary | ICD-10-CM | POA: Diagnosis present

## 2022-06-22 DIAGNOSIS — D492 Neoplasm of unspecified behavior of bone, soft tissue, and skin: Secondary | ICD-10-CM

## 2022-06-22 DIAGNOSIS — E1122 Type 2 diabetes mellitus with diabetic chronic kidney disease: Secondary | ICD-10-CM | POA: Diagnosis present

## 2022-06-22 DIAGNOSIS — J449 Chronic obstructive pulmonary disease, unspecified: Secondary | ICD-10-CM | POA: Diagnosis present

## 2022-06-22 DIAGNOSIS — Z7951 Long term (current) use of inhaled steroids: Secondary | ICD-10-CM

## 2022-06-22 DIAGNOSIS — Z833 Family history of diabetes mellitus: Secondary | ICD-10-CM | POA: Diagnosis not present

## 2022-06-22 DIAGNOSIS — C7951 Secondary malignant neoplasm of bone: Secondary | ICD-10-CM | POA: Diagnosis present

## 2022-06-22 DIAGNOSIS — N3001 Acute cystitis with hematuria: Secondary | ICD-10-CM | POA: Diagnosis present

## 2022-06-22 DIAGNOSIS — R338 Other retention of urine: Secondary | ICD-10-CM | POA: Diagnosis not present

## 2022-06-22 DIAGNOSIS — Z86711 Personal history of pulmonary embolism: Secondary | ICD-10-CM

## 2022-06-22 DIAGNOSIS — N400 Enlarged prostate without lower urinary tract symptoms: Secondary | ICD-10-CM | POA: Diagnosis present

## 2022-06-22 DIAGNOSIS — G8929 Other chronic pain: Secondary | ICD-10-CM | POA: Diagnosis present

## 2022-06-22 DIAGNOSIS — M109 Gout, unspecified: Secondary | ICD-10-CM | POA: Diagnosis present

## 2022-06-22 DIAGNOSIS — I129 Hypertensive chronic kidney disease with stage 1 through stage 4 chronic kidney disease, or unspecified chronic kidney disease: Secondary | ICD-10-CM | POA: Diagnosis present

## 2022-06-22 DIAGNOSIS — K219 Gastro-esophageal reflux disease without esophagitis: Secondary | ICD-10-CM | POA: Diagnosis present

## 2022-06-22 DIAGNOSIS — C689 Malignant neoplasm of urinary organ, unspecified: Secondary | ICD-10-CM | POA: Diagnosis present

## 2022-06-22 DIAGNOSIS — Z86718 Personal history of other venous thrombosis and embolism: Secondary | ICD-10-CM

## 2022-06-22 DIAGNOSIS — Z79899 Other long term (current) drug therapy: Secondary | ICD-10-CM

## 2022-06-22 DIAGNOSIS — E875 Hyperkalemia: Secondary | ICD-10-CM | POA: Diagnosis present

## 2022-06-22 DIAGNOSIS — J9611 Chronic respiratory failure with hypoxia: Secondary | ICD-10-CM | POA: Diagnosis present

## 2022-06-22 DIAGNOSIS — E119 Type 2 diabetes mellitus without complications: Secondary | ICD-10-CM

## 2022-06-22 DIAGNOSIS — K5730 Diverticulosis of large intestine without perforation or abscess without bleeding: Secondary | ICD-10-CM | POA: Diagnosis not present

## 2022-06-22 DIAGNOSIS — N183 Chronic kidney disease, stage 3 unspecified: Secondary | ICD-10-CM | POA: Diagnosis present

## 2022-06-22 DIAGNOSIS — M549 Dorsalgia, unspecified: Secondary | ICD-10-CM | POA: Insufficient documentation

## 2022-06-22 DIAGNOSIS — R627 Adult failure to thrive: Secondary | ICD-10-CM | POA: Diagnosis present

## 2022-06-22 DIAGNOSIS — Z66 Do not resuscitate: Secondary | ICD-10-CM | POA: Diagnosis present

## 2022-06-22 DIAGNOSIS — I2699 Other pulmonary embolism without acute cor pulmonale: Secondary | ICD-10-CM | POA: Diagnosis present

## 2022-06-22 DIAGNOSIS — I10 Essential (primary) hypertension: Secondary | ICD-10-CM | POA: Diagnosis present

## 2022-06-22 DIAGNOSIS — J439 Emphysema, unspecified: Secondary | ICD-10-CM | POA: Diagnosis present

## 2022-06-22 LAB — CBC
HCT: 45.1 % (ref 39.0–52.0)
HCT: 37.2 % — ABNORMAL LOW (ref 39.0–52.0)
Hemoglobin: 14 g/dL (ref 13.0–17.0)
Hemoglobin: 11.8 g/dL — ABNORMAL LOW (ref 13.0–17.0)
MCH: 28.1 pg (ref 26.0–34.0)
MCH: 28.2 pg (ref 26.0–34.0)
MCHC: 31 g/dL (ref 30.0–36.0)
MCHC: 31.7 g/dL (ref 30.0–36.0)
MCV: 90.6 fL (ref 80.0–100.0)
MCV: 88.8 fL (ref 80.0–100.0)
Platelets: 215 10*3/uL (ref 150–400)
Platelets: 225 10*3/uL (ref 150–400)
RBC: 4.98 MIL/uL (ref 4.22–5.81)
RBC: 4.19 MIL/uL — ABNORMAL LOW (ref 4.22–5.81)
RDW: 13.4 % (ref 11.5–15.5)
RDW: 13.4 % (ref 11.5–15.5)
WBC: 7.8 10*3/uL (ref 4.0–10.5)
WBC: 7.5 10*3/uL (ref 4.0–10.5)
nRBC: 0 % (ref 0.0–0.2)
nRBC: 0 % (ref 0.0–0.2)

## 2022-06-22 LAB — URINALYSIS, ROUTINE W REFLEX MICROSCOPIC
Bilirubin Urine: NEGATIVE
Bilirubin Urine: NEGATIVE
Glucose, UA: NEGATIVE mg/dL
Glucose, UA: NEGATIVE mg/dL
Ketones, ur: NEGATIVE mg/dL
Ketones, ur: NEGATIVE mg/dL
Nitrite: NEGATIVE
Nitrite: NEGATIVE
Protein, ur: 30 mg/dL — AB
Protein, ur: 30 mg/dL — AB
RBC / HPF: >50 RBC/hpf — ABNORMAL HIGH (ref 0–5)
Specific Gravity, Urine: 1.024 (ref 1.005–1.030)
Specific Gravity, Urine: 1.021 (ref 1.005–1.030)
Trans Epithel, UA: 1
WBC, UA: >50 WBC/hpf — ABNORMAL HIGH (ref 0–5)
WBC, UA: >50 WBC/hpf — ABNORMAL HIGH (ref 0–5)
pH: 5 (ref 5.0–8.0)
pH: 5 (ref 5.0–8.0)

## 2022-06-22 LAB — COMPREHENSIVE METABOLIC PANEL
ALT: 16 U/L (ref 0–44)
AST: 20 U/L (ref 15–41)
Albumin: 4.1 g/dL (ref 3.5–5.0)
Alkaline Phosphatase: 107 U/L (ref 38–126)
Anion gap: 11 (ref 5–15)
BUN: 36 mg/dL — ABNORMAL HIGH (ref 8–23)
CO2: 21 mmol/L — ABNORMAL LOW (ref 22–32)
Calcium: 9.7 mg/dL (ref 8.9–10.3)
Chloride: 105 mmol/L (ref 98–111)
Creatinine, Ser: 2.71 mg/dL — ABNORMAL HIGH (ref 0.61–1.24)
GFR, Estimated: 24 mL/min — ABNORMAL LOW (ref >60)
Glucose, Bld: 111 mg/dL — ABNORMAL HIGH (ref 70–99)
Potassium: 5.3 mmol/L — ABNORMAL HIGH (ref 3.5–5.1)
Sodium: 137 mmol/L (ref 135–145)
Total Bilirubin: 0.6 mg/dL (ref 0.3–1.2)
Total Protein: 8.4 g/dL — ABNORMAL HIGH (ref 6.5–8.1)

## 2022-06-22 LAB — BASIC METABOLIC PANEL
Anion gap: 8 (ref 5–15)
BUN: 32 mg/dL — ABNORMAL HIGH (ref 8–23)
CO2: 19 mmol/L — ABNORMAL LOW (ref 22–32)
Calcium: 8.6 mg/dL — ABNORMAL LOW (ref 8.9–10.3)
Chloride: 108 mmol/L (ref 98–111)
Creatinine, Ser: 2.15 mg/dL — ABNORMAL HIGH (ref 0.61–1.24)
GFR, Estimated: 32 mL/min — ABNORMAL LOW (ref >60)
Glucose, Bld: 113 mg/dL — ABNORMAL HIGH (ref 70–99)
Potassium: 4.6 mmol/L (ref 3.5–5.1)
Sodium: 135 mmol/L (ref 135–145)

## 2022-06-22 LAB — LIPASE, BLOOD: Lipase: 34 U/L (ref 11–51)

## 2022-06-22 LAB — PROTIME-INR
INR: 1.4 — ABNORMAL HIGH (ref 0.8–1.2)
Prothrombin Time: 17.3 seconds — ABNORMAL HIGH (ref 11.4–15.2)

## 2022-06-22 LAB — RESP PANEL BY RT-PCR (RSV, FLU A&B, COVID)  RVPGX2
Influenza A by PCR: NEGATIVE
Influenza B by PCR: NEGATIVE
Resp Syncytial Virus by PCR: NEGATIVE
SARS Coronavirus 2 by RT PCR: NEGATIVE

## 2022-06-22 MED ORDER — GLYCOPYRROLATE 1 MG PO TABS: 1.0000 mg | ORAL_TABLET | ORAL | Status: DC | PRN

## 2022-06-22 MED ORDER — HYDROMORPHONE HCL 1 MG/ML IJ SOLN
0.5000 mg | Freq: Once | INTRAMUSCULAR | Status: AC
  Administered 2022-06-22: 0.5 mg via INTRAVENOUS
  Filled 2022-06-22: qty 1

## 2022-06-22 MED ORDER — HALOPERIDOL LACTATE 2 MG/ML PO CONC: 0.5000 mg | ORAL | Status: DC | PRN

## 2022-06-22 MED ORDER — HALOPERIDOL 0.5 MG PO TABS: 0.5000 mg | ORAL_TABLET | ORAL | Status: DC | PRN

## 2022-06-22 MED ORDER — ACETAMINOPHEN 325 MG PO TABS
650.0000 mg | ORAL_TABLET | Freq: Four times a day (QID) | ORAL | Status: DC | PRN
  Administered 2022-06-23: 650 mg via ORAL
  Filled 2022-06-22: qty 2

## 2022-06-22 MED ORDER — BIOTENE DRY MOUTH MT LIQD: 15.0000 mL | OROMUCOSAL | Status: DC | PRN

## 2022-06-22 MED ORDER — HALOPERIDOL LACTATE 5 MG/ML IJ SOLN: 0.5000 mg | INTRAMUSCULAR | Status: DC | PRN

## 2022-06-22 MED ORDER — GLYCOPYRROLATE 0.2 MG/ML IJ SOLN: 0.2000 mg | INTRAMUSCULAR | Status: DC | PRN

## 2022-06-22 MED ORDER — ONDANSETRON HCL 4 MG/2ML IJ SOLN: 4.0000 mg | Freq: Four times a day (QID) | INTRAMUSCULAR | Status: DC | PRN

## 2022-06-22 MED ORDER — ACETAMINOPHEN 650 MG RE SUPP: 650.0000 mg | Freq: Four times a day (QID) | RECTAL | Status: DC | PRN

## 2022-06-22 MED ORDER — SODIUM CHLORIDE 0.9 % IV BOLUS
500.0000 mL | Freq: Once | INTRAVENOUS | Status: AC
  Administered 2022-06-22: 500 mL via INTRAVENOUS

## 2022-06-22 MED ORDER — POLYVINYL ALCOHOL 1.4 % OP SOLN: 1.0000 [drp] | Freq: Four times a day (QID) | OPHTHALMIC | Status: DC | PRN

## 2022-06-22 MED ORDER — FENTANYL CITRATE PF 50 MCG/ML IJ SOSY
25.0000 ug | PREFILLED_SYRINGE | Freq: Once | INTRAMUSCULAR | Status: AC
  Administered 2022-06-22: 25 ug via INTRAVENOUS
  Filled 2022-06-22: qty 1

## 2022-06-22 MED ORDER — ONDANSETRON 4 MG PO TBDP: 4.0000 mg | ORAL_TABLET | Freq: Four times a day (QID) | ORAL | Status: DC | PRN

## 2022-06-22 MED ORDER — SODIUM CHLORIDE 0.9 % IV SOLN
1.0000 g | Freq: Once | INTRAVENOUS | Status: AC
  Administered 2022-06-22: 1 g via INTRAVENOUS
  Filled 2022-06-22: qty 10

## 2022-06-22 MED ORDER — HYDROMORPHONE HCL 1 MG/ML IJ SOLN
0.5000 mg | INTRAMUSCULAR | Status: DC | PRN
  Administered 2022-06-22 – 2022-06-23 (×4): 0.5 mg via INTRAVENOUS
  Filled 2022-06-22 (×4): qty 0.5

## 2022-06-22 MED ORDER — SODIUM CHLORIDE 0.9 % IV SOLN: INTRAVENOUS | Status: DC

## 2022-06-22 MED ORDER — CEFAZOLIN SODIUM-DEXTROSE 2-4 GM/100ML-% IV SOLN: 2.0000 g | INTRAVENOUS | Status: DC

## 2022-06-22 MED ORDER — OXYCODONE HCL 5 MG PO TABS
5.0000 mg | ORAL_TABLET | ORAL | Status: DC | PRN
  Administered 2022-06-23: 5 mg via ORAL
  Filled 2022-06-22: qty 1

## 2022-06-22 NOTE — ED Triage Notes (Signed)
Pt arrived via POV with spouse, sent by urology. Went for follow up for cath removal and told he was dehydrated and to come to ED for IV fluids.

## 2022-06-22 NOTE — ED Provider Notes (Signed)
[facility] DEPT Provider Note   CSN: [id number] Arrival date & time: 06/22/22  1020     History  No chief complaint on file.   Rayman Arel is a 71 y.o. male.  HPI   71 year old male presents emergency department from neurology with complaints of dehydration.  Patient had urologist appointment today for catheter removal secondary to urinary retention.  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  Patient is accompanied by 2 family members who are primary historians.  They state that patient has had decreased p.o. intake over the past 2 to 3 days with feelings of nausea with no episodes of emesis.  Patient was seen earlier by urology and told to come the emergency department for further evaluation.  Patient currently complains of "entire back pain" of this is his baseline secondary to known metastasis; patient has been without his pain medication today and states the pain is worse than normal.  He secondarily endorses abdominal pain that he states is worsened with consumption of food.  Denies fever, chills, night sweats, chest pain, shortness of breath, urinary symptoms.  Patient states that he is dealt with intermittently with constipation secondary to chronic oxycodone use of which he is on a laxative with last bowel movement this morning.  Family expresses ability to take care of patient at home if cleared medically today.  Patient DNI with desire to be resuscitated once but "not for long."  Past medical history significant for emphysema, chronic back pain, chronic hip pain, CAD, COPD, CKD 3A, hypotension  Home Medications Prior to Admission medications   Medication Sig Start Date End Date Taking? Authorizing Provider  albuterol (PROVENTIL) (2.5 MG/3ML) 0.083% nebulizer solution Take 3 mLs (2.5 mg total) by nebulization every 6 (six) hours as needed for wheezing or shortness of breath. 10/31/21   Freddi Starr, MD  albuterol (VENTOLIN HFA) 108 (90 Base) MCG/ACT inhaler Inhale 2 puffs into the lungs every 4 (four) hours as needed for wheezing or shortness of breath. 10/23/21   Aline August, MD  ammonium lactate (LAC-HYDRIN) 12 % lotion Apply 1 Application topically as needed for dry skin.    [provider]  apixaban (ELIQUIS) 5 MG TABS tablet Take 1 tablet (5 mg total) by mouth 2 (two) times daily. 06/10/18   Regalado, Belkys A, MD  ascorbic acid (VITAMIN C) 500 MG tablet Take 500 mg by mouth in the morning and at bedtime. 05/10/21   [provider]  atorvastatin (LIPITOR) 80 MG tablet Take 1 tablet (80 mg total) by mouth at bedtime. 06/09/22   Mercy Riding, MD  buPROPion (WELLBUTRIN SR) 150 MG 12 hr tablet Take 150 mg by mouth 2 (two) times daily. 11/09/21   [provider]  diclofenac Sodium (VOLTAREN) 1 % GEL Apply 4 g topically 2 (two) times daily as needed (Pain).    [provider]  docusate sodium (COLACE) 100 MG capsule Take 100 mg by mouth 2 (two) times daily.    [provider]  ezetimibe (ZETIA) 10 MG tablet Take 1 tablet (10 mg total) by mouth daily. 08/18/21   Chandrasekhar, Lyda Kalata A, MD  ferrous sulfate 325 (65 FE) MG tablet Take 1 tablet (325 mg total) by mouth daily. Patient taking differently: Take 325 mg by mouth every Monday, Wednesday, and Friday. 06/21/21 06/21/22  Shelly Coss, MD  finasteride (PROSCAR) 5 MG tablet Take 5 mg by mouth daily.  [provider]  fluticasone (FLONASE) 50 MCG/ACT nasal spray Place 1 spray into both nostrils as needed for allergies or rhinitis.    [provider]  fluticasone-salmeterol (WIXELA INHUB) 250-50 MCG/ACT AEPB Inhale 1 puff into the lungs in the morning and at bedtime.    [provider]  gabapentin (NEURONTIN) 300 MG capsule Take 300-600 mg by mouth See admin instructions. Taking 300 mg in the AM and 600 mg at bedtime.    [provider]  levocetirizine  (XYZAL) 5 MG tablet Take 5 mg by mouth daily as needed for allergies.    [provider]  lidocaine (LIDODERM) 5 % Place 1 patch onto the skin every 12 (twelve) hours as needed (pain).    [provider]  meclizine (ANTIVERT) 25 MG tablet Take 25 mg by mouth 3 (three) times daily as needed for dizziness.    [provider]  methenamine (MANDELAMINE) 1 g tablet Take 1,000 mg by mouth 2 (two) times daily.    [provider]  metoprolol succinate (TOPROL XL) 25 MG 24 hr tablet Take 1 tablet (25 mg total) by mouth at bedtime. 10/31/21 11/01/22  Swinyer, Lanice Schwab, NP  montelukast (SINGULAIR) 10 MG tablet Take 10 mg by mouth at bedtime.    [provider]  naloxone [facility]) nasal spray 4 mg/0.1 mL Place 1 spray into the nose as needed (opioid overdose).    [provider]  nicotine (NICODERM CQ - DOSED IN MG/24 HOURS) 21 mg/24hr patch Place 1 patch (21 mg total) onto the skin daily. 10/23/21   Aline August, MD  nitroGLYCERIN (NITROSTAT) 0.4 MG SL tablet DISSOLVE ONE TABLET UNDER THE TONGUE EVERY 5 MINUTES AS NEEDED FOR CHEST PAIN.  DO NOT EXCEED A TOTAL OF 3 DOSES IN 15 MINUTES NOW 12/05/21   Rudean Haskell A, MD  oxyCODONE-acetaminophen (PERCOCET) 10-325 MG tablet Take 1 tablet by mouth every 6 (six) hours as needed for pain. 06/04/22   Wyatt Portela, MD  pantoprazole (PROTONIX) 40 MG tablet Take 1 tablet (40 mg total) by mouth 2 (two) times daily. 10/23/21 10/23/22  Aline August, MD  senna-docusate (SENOKOT-S) 8.6-50 MG tablet Take 1 tablet by mouth 2 (two) times daily as needed for moderate constipation. 05/27/22   Mercy Riding, MD  sildenafil (VIAGRA) 100 MG tablet Take 100 mg by mouth daily as needed for erectile dysfunction. 01/25/20   [provider]  tamsulosin (FLOMAX) 0.4 MG CAPS capsule Take 0.4 mg by mouth at bedtime.    [provider]  Tiotropium Bromide Monohydrate (SPIRIVA RESPIMAT) 2.5 MCG/ACT AERS Inhale 2 puffs  into the lungs daily.    [provider]      Allergies    Patient has no known allergies.    Review of Systems   Review of Systems  All other systems reviewed and are negative.   Physical Exam Updated Vital Signs BP (!) 58/45 (BP Location: Left Arm)   Pulse 85   Temp 97.7 F (36.5 C) (Oral)   Resp 16   SpO2 100%  Physical Exam Vitals and nursing note reviewed.  Constitutional:      Appearance: He is well-developed. He is not diaphoretic.     Comments: Patient difficult to find comfortable position given that any pressure on his back causes pain.  HENT:     Head: Normocephalic and atraumatic.  Eyes:     Conjunctiva/sclera: Conjunctivae normal.  Cardiovascular:     Rate and Rhythm: Normal rate and  regular rhythm.     Heart sounds: No murmur heard. Pulmonary:     Effort: Pulmonary effort is normal. No respiratory distress.     Breath sounds: Normal breath sounds.  Abdominal:     Palpations: Abdomen is soft.     Tenderness: There is abdominal tenderness.     Comments: Diffuse abdominal tenderness on exam.  Musculoskeletal:        General: No swelling.     Cervical back: Neck supple.     Right lower leg: No edema.     Left lower leg: No edema.     Comments: Diffuse thoracic and lumbar back midline tenderness. Patient with ability to move lower extremities with back of low back. Strength symmetric bilaterally.   Skin:    General: Skin is warm and dry.     Capillary Refill: Capillary refill takes less than 2 seconds.  Neurological:     Mental Status: He is alert.  Psychiatric:        Mood and Affect: Mood normal.     ED Results / Procedures / Treatments   Labs (all labs ordered are listed, but only abnormal results are displayed) Labs Reviewed  COMPREHENSIVE METABOLIC PANEL - Abnormal; Notable for the following components:      Result Value   Potassium 5.3 (*)    CO2 21 (*)    Glucose, Bld 111 (*)    BUN 36 (*)    Creatinine, Ser 2.71 (*)    Total  Protein 8.4 (*)    GFR, Estimated 24 (*)    All other components within normal limits  URINALYSIS, ROUTINE W REFLEX MICROSCOPIC - Abnormal; Notable for the following components:   Color, Urine AMBER (*)    APPearance CLOUDY (*)    Hgb urine dipstick SMALL (*)    Protein, ur 30 (*)    Leukocytes,Ua LARGE (*)    WBC, UA >50 (*)    Bacteria, UA MANY (*)    All other components within normal limits  URINALYSIS, ROUTINE W REFLEX MICROSCOPIC - Abnormal; Notable for the following components:   APPearance CLOUDY (*)    Hgb urine dipstick LARGE (*)    Protein, ur 30 (*)    Leukocytes,Ua LARGE (*)    RBC / HPF >50 (*)    WBC, UA >50 (*)    Bacteria, UA MANY (*)    All other components within normal limits  RESP PANEL BY RT-PCR (RSV, FLU A&B, COVID)  [id number]  URINE CULTURE  CBC  LIPASE, BLOOD    EKG None  Radiology No results found.  Procedures Procedures    Medications Ordered in ED Medications  sodium chloride 0.9 % bolus 500 mL (has no administration in time range)  acetaminophen (TYLENOL) tablet 650 mg (has no administration in time range)    Or  acetaminophen (TYLENOL) suppository 650 mg (has no administration in time range)  haloperidol (HALDOL) tablet 0.5 mg (has no administration in time range)    Or  haloperidol (HALDOL) 2 MG/ML solution 0.5 mg (has no administration in time range)    Or  haloperidol lactate (HALDOL) injection 0.5 mg (has no administration in time range)  ondansetron (ZOFRAN-ODT) disintegrating tablet 4 mg (has no administration in time range)    Or  ondansetron (ZOFRAN) injection 4 mg (has no administration in time range)  glycopyrrolate (ROBINUL) tablet 1 mg (has no administration in time range)    Or  glycopyrrolate (ROBINUL) injection 0.2 mg (has no administration in time  range)    Or  glycopyrrolate (ROBINUL) injection 0.2 mg (has no administration in time range)  antiseptic oral rinse (BIOTENE) solution 15 mL (has no administration in time  range)  polyvinyl alcohol (LIQUIFILM TEARS) 1.4 % ophthalmic solution 1 drop (has no administration in time range)  oxyCODONE (Oxy IR/ROXICODONE) immediate release tablet 5 mg (has no administration in time range)  HYDROmorphone (DILAUDID) injection 0.5 mg (has no administration in time range)  sodium chloride 0.9 % bolus 500 mL (0 mLs Intravenous Stopped 06/22/22 1414)  fentaNYL (SUBLIMAZE) injection 25 mcg (25 mcg Intravenous Given 06/22/22 1148)  sodium chloride 0.9 % bolus 500 mL (0 mLs Intravenous Stopped 06/22/22 1626)  cefTRIAXone (ROCEPHIN) 1 g in sodium chloride 0.9 % 100 mL IVPB (0 g Intravenous Stopped 06/22/22 1627)  HYDROmorphone (DILAUDID) injection 0.5 mg (0.5 mg Intravenous Given 06/22/22 1521)    ED Course/ Medical Decision Making/ A&P Clinical Course as of 06/22/22 1744  Fri Jun 22, 2022  1459 Consulted hospital medicine Dr. Marylyn Ishihara regarding the patient.  He agrees admission the patient assume further treatment/care. [CR]    Clinical Course User Index [CR] Wilnette Kales, PA                           Medical Decision Making Amount and/or Complexity of Data Reviewed Labs: ordered. Radiology: ordered.  Risk Prescription drug management. Decision regarding hospitalization.   This patient presents to the ED for concern of dehydration, this involves an extensive number of treatment options, and is a complaint that carries with it a high risk of complications and morbidity.  The differential diagnosis includes metabolic derangement, progression of malignancy, infectious etiology,    Co morbidities that complicate the patient evaluation  See HPI   Additional history obtained:  Additional history obtained from EMR External records from outside source obtained and reviewed including hospital records   Lab Tests:  I Ordered, and personally interpreted labs.  The pertinent results include:  No leukocytosis, No evidence of anemia. No platelet abnormalities. Mild  hyperkalemia of 5.3. Renal dysfunction with elevated creatinine of 2.71 from patients baseline as well as BUN of 36 and GFR of 24. No transaminitis. Resp viral panel negative. UA significant for many bacteria, > 50 RBCs and WBCs with large leukocytes. Urine culture pending.    Imaging Studies ordered:  I ordered imaging studies including CT abdomen pelvis  I independently visualized and interpreted imaging which showed T11 body progression anteriorly. Progression of 2.8 cm lytic lesion of left pubic bone. Aortic atherosclerosis.  I agree with the radiologist interpretation  Cardiac Monitoring: / EKG:  The patient was maintained on a cardiac monitor.  I personally viewed and interpreted the cardiac monitored which showed an underlying rhythm of: sinus rhythm   Consultations Obtained:  See ED course   Problem List / ED Course / Critical interventions / Medication management  Failure to thrive, AKI, acute cystitis, dehydration I ordered medication including dilaudid and fentanyl for pain. 500cc x3 of NS for hypotension. Rocephin for empiric abx coverage of UTI    Reevaluation of the patient after these medicines showed that the patient improved I have reviewed the patients home medicines and have made adjustments as needed   Social Determinants of Health:  Some cigarette use. Denies illicit drug use   Test / Admission - Considered:  Failure to thrive, AKI, acute cystitis, dehydration Vitals signs significant for Intermittent hypotension which responded well to IV fluids. Otherwise  within normal range and stable throughout visit. Laboratory/imaging studies significant for: see above Patient deemed to meet admission criteria given evidence of AKI with one remaining kidney, failure to thrive and acute cystits. Proper consultations were made while in the ED as seen in ED course. Treatment plan discussed with family and they acknowledged understanding and were agreeable to said plan.  Patient stable upon admission to the hospital          Final Clinical Impression(s) / ED Diagnoses Final diagnoses:  Failure to thrive in adult  AKI (acute kidney injury) ([place])  Acute cystitis with hematuria  Dehydration    Rx / DC Orders ED Discharge Orders     None         Wilnette Kales, [place] 06/22/22 1744    Fredia Sorrow, MD 2022/06/28 [phone]

## 2022-06-22 NOTE — H&P (Signed)
History and Physical    Patient: Gregory Christensen NGE:[id number] DOB: [birth year] DOA: 06/22/2022 DOS: the patient was seen and examined on 06/22/2022 PCP: Amelia Jo, PA  Patient coming from: Home  Chief Complaint: Back pain, dehydration  HPI: Gregory Christensen is a 71 y.o. male with medical history significant of COPD, tobacco abuse, HTN, DM2, PE/DVT, chronic hypoxic respiratory failure on 2L [place], urothelial carcinoma w/ spinal mets. Presenting with back pain. He reports that he has not had an appetite for several days. He had been nauseous, but has not vomited. He has not been able to take his pain meds during this time. He reports that he chronic back pain is much worse now. He reports sharp, severe pain running down his legs. He presented this morning to his urologist for a catheter removal. When they evaluated him, they noted that he was dehydrated. It was recommended that he come to the ED for evaluation. He denies any other aggravating or alleviating factors.     Review of Systems: As mentioned in the history of present illness. All other systems reviewed and are negative. Past Medical History:  Diagnosis Date   Acute deep vein thrombosis (DVT) of left lower extremity (HCC)    Acute hypoxemic respiratory failure (HCC)    Anemia    Iron deficiency   Anxiety    Aortic atherosclerosis (HCC)    Arthritis    Benign prostatic hyperplasia 08/28/2018   UNSPECIFIED WHETHER LOWER URINARY TRACT SYMPTOMS PRESENT   CAD (coronary artery disease)    Cancer (HCC)    Chronic back pain    Chronic hip pain    CKD stage 3a 03/19/2021   Constipation    due to Iron M-W-F   COPD exacerbation ([place]) 10/18/2017   Dyspnea    Emphysema lung ([place])    Essential hypertension    GERD (gastroesophageal reflux disease)    Gout    Gross hematuria 09/08/2018   Head injury, acute, with loss of consciousness ([place])    unsure how long he was unresponsive   History of blood transfusion 05/2019   History of kidney  stones    History of tobacco abuse    Hypoxia    Insomnia    Oxygen dependent    2l- 24/7   Pneumonia    in 1980's   Pre-diabetes    Pulmonary embolism ([place]) 05/31/2018   Renal mass    CONCERNING FOR RENAL CELL CARCINOMA DR. Lovena Neighbours   Respiratory distress    Respiratory failure, acute-on-chronic ([place]) 08/03/2018   Seasonal allergies    Sleep apnea    Past Surgical History:  Procedure Laterality Date   BIOPSY  06/19/2021   Procedure: BIOPSY;  Surgeon: Wilford Corner, MD;  Location: [facility];  Service: Endoscopy;;   COLONOSCOPY WITH PROPOFOL N/A 09/27/2019   Procedure: COLONOSCOPY WITH PROPOFOL;  Surgeon: Irene Shipper, MD;  [facility];  Service: Endoscopy;  Laterality: N/A;   CYSTOSCOPY/URETEROSCOPY/HOLMIUM LASER/STENT PLACEMENT Left 10/31/2018   Procedure: CYSTOSCOPY/URETEROSCOPY;  Surgeon: Ceasar Mons, MD;  [facility];  Service: Urology;  Laterality: Left;   ESOPHAGOGASTRODUODENOSCOPY (EGD) WITH PROPOFOL N/A 09/27/2019   Procedure: ESOPHAGOGASTRODUODENOSCOPY (EGD) WITH PROPOFOL;  Surgeon: Irene Shipper, MD;  [facility];  Service: Endoscopy;  Laterality: N/A;   ESOPHAGOGASTRODUODENOSCOPY (EGD) WITH PROPOFOL N/A 06/19/2021   Procedure: ESOPHAGOGASTRODUODENOSCOPY (EGD) WITH PROPOFOL;  Surgeon: Wilford Corner, MD;  Location: [place] [place];  Service: Endoscopy;  Laterality: N/A;   GIVENS CAPSULE STUDY N/A 06/20/2021   Procedure: GIVENS  CAPSULE STUDY;  Surgeon: Wilford Corner, MD;  Location: [facility];  Service: Endoscopy;  Laterality: N/A;   HERNIA REPAIR Left 1977   inguinal   HYDROCELE EXCISION Left 05/13/2019   Procedure: HYDROCELECTOMY ADULT;  Surgeon: Ceasar Mons, MD;  [facility];  Service: Urology;  Laterality: Left;   [place]   EXTENSIVE   POLYPECTOMY  09/27/2019   Procedure: POLYPECTOMY;  Surgeon: Irene Shipper, MD;  [facility];  Service: Endoscopy;;   ROBOT ASSITED  LAPAROSCOPIC NEPHROURETERECTOMY Left 10/31/2018   Procedure: XI ROBOT ASSITED LAPAROSCOPIC NEPHROURETERECTOMY;  Surgeon: Ceasar Mons, MD;  [facility];  Service: Urology;  Laterality: Left;  ONLY NEEDS 240 MIN FOR ALL PROCEDURES   SHOULDER ARTHROSCOPY WITH ROTATOR CUFF REPAIR Left 04/12/2020   Procedure: LEFT SHOULDER ARTHROSCOPY, DEBRIDEMENT, BICEPS TENODESIS, MINI OPEN ROTATOR CUFF TEAR REPAIR;  Surgeon: Meredith Pel, MD;  Location: [facility];  Service: Orthopedics;  Laterality: Left;   Social History:  reports that he has been smoking cigarettes. He has a 28.50 pack-year smoking history. He has never used smokeless tobacco. He reports current alcohol use of about 10.0 standard drinks of alcohol per week. He reports that he does not currently use drugs after having used the following drugs: Flunitrazepam.  No Known Allergies  Family History  Problem Relation Age of Onset   Diabetes Mother    Hypertension Sister    Hypertension Brother     Prior to Admission medications   Medication Sig Start Date End Date Taking? Authorizing Provider  albuterol (PROVENTIL) (2.5 MG/3ML) 0.083% nebulizer solution Take 3 mLs (2.5 mg total) by nebulization every 6 (six) hours as needed for wheezing or shortness of breath. 10/31/21   Freddi Starr, MD  albuterol (VENTOLIN HFA) 108 (90 Base) MCG/ACT inhaler Inhale 2 puffs into the lungs every 4 (four) hours as needed for wheezing or shortness of breath. 10/23/21   Aline August, MD  ammonium lactate (LAC-HYDRIN) 12 % lotion Apply 1 Application topically as needed for dry skin.    [provider]  apixaban (ELIQUIS) 5 MG TABS tablet Take 1 tablet (5 mg total) by mouth 2 (two) times daily. 06/10/18   Regalado, Belkys A, MD  ascorbic acid (VITAMIN C) 500 MG tablet Take 500 mg by mouth in the morning and at bedtime. 05/10/21   [provider]  atorvastatin (LIPITOR) 80 MG tablet Take 1 tablet (80 mg total) by mouth at bedtime.  06/09/22   Mercy Riding, MD  buPROPion (WELLBUTRIN SR) 150 MG 12 hr tablet Take 150 mg by mouth 2 (two) times daily. 11/09/21   [provider]  diclofenac Sodium (VOLTAREN) 1 % GEL Apply 4 g topically 2 (two) times daily as needed (Pain).    [provider]  docusate sodium (COLACE) 100 MG capsule Take 100 mg by mouth 2 (two) times daily.    [provider]  ezetimibe (ZETIA) 10 MG tablet Take 1 tablet (10 mg total) by mouth daily. 08/18/21   Chandrasekhar, Lyda Kalata A, MD  ferrous sulfate 325 (65 FE) MG tablet Take 1 tablet (325 mg total) by mouth daily. Patient taking differently: Take 325 mg by mouth every Monday, Wednesday, and Friday. 06/21/21 06/21/22  Shelly Coss, MD  finasteride (PROSCAR) 5 MG tablet Take 5 mg by mouth daily.    [provider]  fluticasone (FLONASE) 50 MCG/ACT nasal spray Place 1 spray into both nostrils as needed for allergies or rhinitis.  [provider]  fluticasone-salmeterol (WIXELA INHUB) 250-50 MCG/ACT AEPB Inhale 1 puff into the lungs in the morning and at bedtime.    [provider]  gabapentin (NEURONTIN) 300 MG capsule Take 300-600 mg by mouth See admin instructions. Taking 300 mg in the AM and 600 mg at bedtime.    [provider]  levocetirizine (XYZAL) 5 MG tablet Take 5 mg by mouth daily as needed for allergies.    [provider]  lidocaine (LIDODERM) 5 % Place 1 patch onto the skin every 12 (twelve) hours as needed (pain).    [provider]  meclizine (ANTIVERT) 25 MG tablet Take 25 mg by mouth 3 (three) times daily as needed for dizziness.    [provider]  methenamine (MANDELAMINE) 1 g tablet Take 1,000 mg by mouth 2 (two) times daily.    [provider]  metoprolol succinate (TOPROL XL) 25 MG 24 hr tablet Take 1 tablet (25 mg total) by mouth at bedtime. 10/31/21 11/01/22  Swinyer, Lanice Schwab, NP  montelukast (SINGULAIR) 10 MG tablet Take 10 mg by mouth at  bedtime.    [provider]  naloxone [facility]) nasal spray 4 mg/0.1 mL Place 1 spray into the nose as needed (opioid overdose).    [provider]  nicotine (NICODERM CQ - DOSED IN MG/24 HOURS) 21 mg/24hr patch Place 1 patch (21 mg total) onto the skin daily. 10/23/21   Aline August, MD  nitroGLYCERIN (NITROSTAT) 0.4 MG SL tablet DISSOLVE ONE TABLET UNDER THE TONGUE EVERY 5 MINUTES AS NEEDED FOR CHEST PAIN.  DO NOT EXCEED A TOTAL OF 3 DOSES IN 15 MINUTES NOW 12/05/21   Rudean Haskell A, MD  oxyCODONE-acetaminophen (PERCOCET) 10-325 MG tablet Take 1 tablet by mouth every 6 (six) hours as needed for pain. 06/04/22   Wyatt Portela, MD  pantoprazole (PROTONIX) 40 MG tablet Take 1 tablet (40 mg total) by mouth 2 (two) times daily. 10/23/21 10/23/22  Aline August, MD  senna-docusate (SENOKOT-S) 8.6-50 MG tablet Take 1 tablet by mouth 2 (two) times daily as needed for moderate constipation. 05/27/22   Mercy Riding, MD  sildenafil (VIAGRA) 100 MG tablet Take 100 mg by mouth daily as needed for erectile dysfunction. 01/25/20   [provider]  tamsulosin (FLOMAX) 0.4 MG CAPS capsule Take 0.4 mg by mouth at bedtime.    [provider]  Tiotropium Bromide Monohydrate (SPIRIVA RESPIMAT) 2.5 MCG/ACT AERS Inhale 2 puffs into the lungs daily.    [provider]    Physical Exam: Vitals:   06/22/22 1028 06/22/22 1215 06/22/22 1245 06/22/22 1410  BP: 120/66 (!) 126/103 (!) 83/65 (!) 139/116  Pulse: 97 81 85 85  Resp: '18 18 18 18  '$ Temp: (!) 97.5 F (36.4 C)   97.7 F (36.5 C)  TempSrc: Oral   Oral  SpO2: 95% 99% 100% 100%   General: 71 y.o. male resting in bed in obvious pain Eyes: PERRL, normal sclera ENMT: Nares patent w/o discharge, orophaynx clear, dentition normal, ears w/o discharge/lesions/ulcers Neck: Supple, trachea midline Cardiovascular: RRR, +S1, S2, no m/g/r, equal pulses throughout Respiratory: CTABL, no w/r/r, normal WOB GI: BS+, NDNT, no  masses noted, no organomegaly noted MSK: No e/c/c; dry skin Neuro: A&O x 3, no focal deficits Psyc: Appropriate interaction and affect, calm/cooperative  Data Reviewed:  Results for orders placed or performed during the hospital encounter of 06/22/22 (from the past 24 hour(s))  Urinalysis, Routine w reflex microscopic Urine, Catheterized  Status: Abnormal   Collection Time: 06/22/22 11:06 AM  Result Value Ref Range   Color, Urine AMBER (A) YELLOW   APPearance CLOUDY (A) CLEAR   Specific Gravity, Urine 1.024 1.005 - 1.030   pH 5.0 5.0 - 8.0   Glucose, UA NEGATIVE NEGATIVE mg/dL   Hgb urine dipstick SMALL (A) NEGATIVE   Bilirubin Urine NEGATIVE NEGATIVE   Ketones, ur NEGATIVE NEGATIVE mg/dL   Protein, ur 30 (A) NEGATIVE mg/dL   Nitrite NEGATIVE NEGATIVE   Leukocytes,Ua LARGE (A) NEGATIVE   RBC / HPF 21-50 0 - 5 RBC/hpf   WBC, UA >50 (H) 0 - 5 WBC/hpf   Bacteria, UA MANY (A) NONE SEEN   Squamous Epithelial / LPF 11-20 0 - 5   Mucus PRESENT   Comprehensive metabolic panel     Status: Abnormal   Collection Time: 06/22/22 11:59 AM  Result Value Ref Range   Sodium 137 135 - 145 mmol/L   Potassium 5.3 (H) 3.5 - 5.1 mmol/L   Chloride 105 98 - 111 mmol/L   CO2 21 (L) 22 - 32 mmol/L   Glucose, Bld 111 (H) 70 - 99 mg/dL   BUN 36 (H) 8 - 23 mg/dL   Creatinine, Ser 2.71 (H) 0.61 - 1.24 mg/dL   Calcium 9.7 8.9 - 10.3 mg/dL   Total Protein 8.4 (H) 6.5 - 8.1 g/dL   Albumin 4.1 3.5 - 5.0 g/dL   AST 20 15 - 41 U/L   ALT 16 0 - 44 U/L   Alkaline Phosphatase 107 38 - 126 U/L   Total Bilirubin 0.6 0.3 - 1.2 mg/dL   GFR, Estimated 24 (L) >60 mL/min   Anion gap 11 5 - 15  CBC     Status: None   Collection Time: 06/22/22 11:59 AM  Result Value Ref Range   WBC 7.8 4.0 - 10.5 K/uL   RBC 4.98 4.22 - 5.81 MIL/uL   Hemoglobin 14.0 13.0 - 17.0 g/dL   HCT 45.1 39.0 - 52.0 %   MCV 90.6 80.0 - 100.0 fL   MCH 28.1 26.0 - 34.0 pg   MCHC 31.0 30.0 - 36.0 g/dL   RDW 13.4 11.5 - 15.5 %    Platelets 215 150 - 400 K/uL   nRBC 0.0 0.0 - 0.2 %  Lipase, blood     Status: None   Collection Time: 06/22/22 12:00 PM  Result Value Ref Range   Lipase 34 11 - 51 U/L  Urinalysis, Routine w reflex microscopic Urine, Catheterized     Status: Abnormal   Collection Time: 06/22/22  1:45 PM  Result Value Ref Range   Color, Urine YELLOW YELLOW   APPearance CLOUDY (A) CLEAR   Specific Gravity, Urine 1.021 1.005 - 1.030   pH 5.0 5.0 - 8.0   Glucose, UA NEGATIVE NEGATIVE mg/dL   Hgb urine dipstick LARGE (A) NEGATIVE   Bilirubin Urine NEGATIVE NEGATIVE   Ketones, ur NEGATIVE NEGATIVE mg/dL   Protein, ur 30 (A) NEGATIVE mg/dL   Nitrite NEGATIVE NEGATIVE   Leukocytes,Ua LARGE (A) NEGATIVE   RBC / HPF >50 (H) 0 - 5 RBC/hpf   WBC, UA >50 (H) 0 - 5 WBC/hpf   Bacteria, UA MANY (A) NONE SEEN   Trans Epithel, UA 1    WBC Clumps PRESENT    Mucus PRESENT   Resp panel by RT-PCR (RSV, Flu A&B, Covid) Anterior Nasal Swab     Status: None   Collection Time: 06/22/22  2:11 PM  Specimen: Anterior Nasal Swab  Result Value Ref Range   SARS Coronavirus 2 by RT PCR NEGATIVE NEGATIVE   Influenza A by PCR NEGATIVE NEGATIVE   Influenza B by PCR NEGATIVE NEGATIVE   Resp Syncytial Virus by PCR NEGATIVE NEGATIVE   Assessment and Plan: AKI on CKD 3a     - place in obs, tele     - he was given a fluid bolus upon presentation to the ED     - see below  Urothelial carcinoma w/ mets to spine Back pain     - spoke at length with the patient and his daughter about their wishes     - they have had a hospice/palliative care conversation in the past and are aware of those options     - his daughter states that the patient is very tired and they have discussed comfort care measures     - when asked if he would prefer comfort care, he is clear in saying yes; he is aware that no further curative treatments will be attempted     - we will hold any further antibiotics     - we will finish his current bolus of  fluids     - we will start the End of Life order set for pain control     - TOC has been consulted for residential hospice evaluation     - we will continue his chronic O2 supplementation for now  UTI COPD HTN DM2 Hx of PE/DVT Chronic hypoxic respiratory failure on 2L [place]     - patient  and family have elected comfort care measures; no further acute interventions at this time  Advance Care Planning:   Code Status: DNR/comfort care  Consults: None  Family Communication: w/ dtr at bedside  Severity of Illness: The appropriate patient status for this patient is INPATIENT. Inpatient status is judged to be reasonable and necessary in order to provide the required intensity of service to ensure the patient's safety. The patient's presenting symptoms, physical exam findings, and initial radiographic and laboratory data in the context of their chronic comorbidities is felt to place them at high risk for further clinical deterioration. Furthermore, it is not anticipated that the patient will be medically stable for discharge from the hospital within 2 midnights of admission.   * I certify that at the point of admission it is my clinical judgment that the patient will require inpatient hospital care spanning beyond 2 midnights from the point of admission due to high intensity of service, high risk for further deterioration and high frequency of surveillance required.*  Author: Jonnie Finner, DO 06/22/2022 3:09 PM  For on call review [email or web address].

## 2022-06-23 ENCOUNTER — Other Ambulatory Visit: Payer: Self-pay

## 2022-06-23 DIAGNOSIS — D492 Neoplasm of unspecified behavior of bone, soft tissue, and skin: Secondary | ICD-10-CM

## 2022-06-23 DIAGNOSIS — N3001 Acute cystitis with hematuria: Secondary | ICD-10-CM | POA: Diagnosis not present

## 2022-06-23 DIAGNOSIS — E860 Dehydration: Secondary | ICD-10-CM

## 2022-06-23 DIAGNOSIS — G893 Neoplasm related pain (acute) (chronic): Secondary | ICD-10-CM

## 2022-06-23 DIAGNOSIS — R627 Adult failure to thrive: Secondary | ICD-10-CM

## 2022-06-23 DIAGNOSIS — M549 Dorsalgia, unspecified: Secondary | ICD-10-CM

## 2022-06-23 DIAGNOSIS — N179 Acute kidney failure, unspecified: Secondary | ICD-10-CM | POA: Diagnosis not present

## 2022-06-23 LAB — URINE CULTURE: Culture: >=100000 — AB

## 2022-06-23 MED ORDER — SODIUM CHLORIDE 0.9% FLUSH: 9.0000 mL | INTRAVENOUS | Status: DC | PRN

## 2022-06-23 MED ORDER — METHOCARBAMOL 500 MG PO TABS
500.0000 mg | ORAL_TABLET | Freq: Once | ORAL | Status: AC
  Administered 2022-06-23: 500 mg via ORAL
  Filled 2022-06-23: qty 1

## 2022-06-23 MED ORDER — HYDROMORPHONE 1 MG/ML IV SOLN
INTRAVENOUS | Status: DC
  Administered 2022-06-23: 0.2 mg via INTRAVENOUS
  Administered 2022-06-23: 2.4 mg via INTRAVENOUS
  Administered 2022-06-23: 1.4 mg via INTRAVENOUS
  Administered 2022-06-23: 30 mg via INTRAVENOUS
  Filled 2022-06-23: qty 30

## 2022-06-23 MED ORDER — NALOXONE HCL 0.4 MG/ML IJ SOLN: 0.4000 mg | INTRAMUSCULAR | Status: DC | PRN

## 2022-06-23 MED ORDER — HYDROMORPHONE HCL 1 MG/ML IJ SOLN
1.0000 mg | Freq: Once | INTRAMUSCULAR | Status: AC
  Administered 2022-06-23: 1 mg via INTRAVENOUS
  Filled 2022-06-23: qty 1

## 2022-06-23 MED ORDER — DIPHENHYDRAMINE HCL 12.5 MG/5ML PO ELIX: 12.5000 mg | ORAL_SOLUTION | Freq: Four times a day (QID) | ORAL | Status: DC | PRN

## 2022-06-23 MED ORDER — HYDROMORPHONE HCL 1 MG/ML IJ SOLN
0.5000 mg | INTRAMUSCULAR | Status: DC | PRN
  Administered 2022-06-23: 0.5 mg via INTRAVENOUS
  Filled 2022-06-23: qty 0.5

## 2022-06-23 MED ORDER — HYDROMORPHONE 1 MG/ML IV SOLN: 1.0000 mg | INTRAVENOUS | 0 refills | Status: AC

## 2022-06-23 MED ORDER — DIPHENHYDRAMINE HCL 50 MG/ML IJ SOLN
12.5000 mg | Freq: Four times a day (QID) | INTRAMUSCULAR | Status: DC | PRN
  Administered 2022-06-23: 12.5 mg via INTRAVENOUS
  Filled 2022-06-23: qty 1

## 2022-06-23 MED ORDER — ONDANSETRON HCL 4 MG/2ML IJ SOLN: 4.0000 mg | Freq: Four times a day (QID) | INTRAMUSCULAR | Status: DC | PRN

## 2022-06-23 NOTE — TOC Initial Note (Addendum)
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 [employer]; she is not sure of his Education officer, museum; Ms Long says the pt has reading glasses, and upper dentures; she says he has a wheel chair and a HHRN thru [facility]; the pt has home oxygen is thru [employer]; she also says the pt has a shower chair and bars in his shower; Ms [place]-Long says the pt does not have food insecurity or difficulty paying utilities; she takes her father to his appts; she agrees to residential hospice and would like [employer]; list of residential hospice facilities left [place] and she will gve it to the pt's dtr;  Roselee Nova hospital liaison for [employer] notified and she will contact the pt's dtr; Corene Cornea at [employer]; [facility] will follow.  Expected Discharge Plan: [place] Barriers to Discharge: Continued Medical Work up   Patient Goals and CMS Choice Patient states their goals for this hospitalization and ongoing recovery are:: Optometrist          Expected Discharge Plan and Services   Discharge Planning Services: CM Consult   Living arrangements for the past 2 months: Single Family Home                                      Prior Living Arrangements/Services Living arrangements for the past 2 months: Single Family Home Lives with:: Parents (home w/ mother) Patient language and need for interpreter reviewed:: Yes Do you feel safe going back to the place where you live?: Yes      Need for Family Participation in Patient Care: Yes (Comment) Care  giver support system in place?: Yes (comment) Current home services: Other (comment) (home oxygen w/ [employer]) Criminal Activity/Legal Involvement Pertinent to Current Situation/Hospitalization: No - Comment as needed  Activities of Daily Living      Permission Sought/Granted Permission sought to share information with : Case Manager Permission granted to share information with : Yes, Verbal Permission Granted  Share Information with NAME: Lenor Coffin, RN, CM           Emotional Assessment       Orientation: : Oriented to Self Alcohol / Substance Use: Not Applicable Psych Involvement: No (comment)  Admission diagnosis:  Dehydration [E86.0] Failure to thrive in adult [R62.7] Acute cystitis with hematuria [N30.01] AKI (acute kidney injury) ([place]) [N17.9] Patient Active Problem List   Diagnosis Date Noted   UTI (urinary tract infection) 06/22/2022   Back pain 06/22/2022   Hypotension 06/06/2022   AKI (acute kidney injury) ([place]) 05/26/2022   Delirium 05/26/2022   GERD (gastroesophageal reflux disease) 05/24/2022   Dyspepsia 05/24/2022   Urothelial carcinoma ([place]) 05/24/2022   Chronic respiratory failure with hypoxia ([place]) 10/18/2021   Anemia 10/18/2021   Chest pain 10/18/2021   Essential hypertension 06/18/2021   GI bleed 06/18/2021   Precordial pain 05/12/2021   BPH with urinary obstruction  03/19/2021   Elevated prostate specific antigen (PSA) 03/19/2021   DM (diabetes mellitus) ([place]) 03/19/2021   Obstructive sleep apnea 03/19/2021   Hyperlipidemia 03/19/2021   Allergic rhinitis 03/19/2021   CKD stage 3a 03/19/2021   Grade I diastolic dysfunction 32/08/3341   COPD (chronic obstructive pulmonary disease) ([place]) 03/19/2021   Benign neoplasm of transverse colon    Benign neoplasm of descending colon    Benign neoplasm of rectum    Heme positive stool    Iron deficiency anemia due to chronic blood loss    Melena    Abdominal pain, chronic, right lower quadrant     Chronic anticoagulation    Symptomatic anemia 09/24/2019   Pneumonia due to severe acute respiratory syndrome coronavirus 2 (SARS-CoV-2) 02/28/2019   Renal mass 10/31/2018   Coronary artery calcification 10/15/2018   Acute on chronic respiratory failure with hypoxia ([place]) 08/03/2018   Acute deep vein thrombosis (DVT) of left lower extremity (HCC)    Acute hypoxemic respiratory failure ([place])    Pulmonary embolism ([place]) 05/31/2018   COPD exacerbation ([place]) 10/18/2017   Hypoxia    Respiratory distress    Hypertension associated with diabetes ([place])    PCP:  Amelia Jo, [place] Pharmacy:   [place] [place] [address] Phone: [phone] Fax: [place], [place] - [address] [place] [address] Phone: [phone] Fax: [phone]     Social Determinants of Health (SDOH) Social History: SDOH Screenings   Tobacco Use: High Risk (06/22/2022)   SDOH Interventions:     Readmission Risk Interventions    06/23/2022    2:37 PM 06/21/2021    3:31 PM  Readmission Risk Prevention Plan  Transportation Screening Complete Complete  PCP or Specialist Appt within 3-5 Days  Complete  HRI or [place]  Complete  Social Work Consult for [place] Planning/Counseling  Complete  Palliative Care Screening  Not Applicable  Medication Review Press photographer) Complete Complete  PCP or Specialist appointment within 3-5 days of discharge Complete   HRI or [place] Complete   SW Recovery Care/Counseling Consult Complete   Palliative Care Screening Complete   [place] Not Applicable

## 2022-06-23 NOTE — Final Progress Note (Addendum)
Manufacturing engineer [facility]) Hospital Liaison Note  Referral received for patient/family interest in [facility]. Chart under [facility] by [facility] physician.   Hospice eligibility pending.   Bed offered and accepted for transfer today to [facility]. Unit RN please call report to [phone] prior to patient leaving the unit. Please send signed DNR and paperwork with patient.   Please leave all IV access and ports in place.   [facility] staff will call the unit once the dilaudid drip has been delivered. Once you receive this call, please arrange transport with [facility] [phone].   Please call with any questions or concerns. Thank you  Roselee Nova, [place] Hospital Liaison [phone]

## 2022-06-23 NOTE — TOC Progression Note (Addendum)
Transition of Care [facility]) - Progression Note    Patient Details  Name: Gregory Christensen MRN: [id number] Date of Birth: [birth year]  Transition of Care [facility]) CM/SW Contact  Henrietta Dine, RN Phone Number: [phone] 06/23/2022, 3:45 PM  Clinical Narrative:    Notified by Roselee Nova, [place] hospital liaison pt has bed at [facility] w/ call report # [phone]; per Tiffany at [facility] pt assigned to RM # 5; she says th pt's medication has to be delivered prior to transport; pt will transported by Colon Flattery RN and Dr Wynelle Cleveland notified; will call [facility] when all paperwork is completed.  -[id number]- notified by Fabio Pierce that [facility] Staff will call when medication has been delivered; pt d/c packet at unit desk (DNR, d/c summary, and medical necessity form); no TOC needs.   Expected Discharge Plan: [place] Barriers to Discharge: Continued Medical Work up  Expected Discharge Plan and Services   Discharge Planning Services: CM Consult   Living arrangements for the past 2 months: Single Family Home                                       Social Determinants of Health (SDOH) Interventions SDOH Screenings   Tobacco Use: High Risk (06/22/2022)    Readmission Risk Interventions    06/23/2022    2:37 PM 06/21/2021    3:31 PM  Readmission Risk Prevention Plan  Transportation Screening Complete Complete  PCP or Specialist Appt within 3-5 Days  Complete  HRI or [place]  Complete  Social Work Consult for [place] Planning/Counseling  Complete  Palliative Care Screening  Not Applicable  Medication Review Press photographer) Complete Complete  PCP or Specialist appointment within 3-5 days of discharge Complete   HRI or [place] Complete   SW Recovery Care/Counseling Consult Complete   Palliative Care Screening Complete   [place] Not Applicable

## 2022-06-23 NOTE — Plan of Care (Signed)
  Problem: Education: Goal: Knowledge of the prescribed therapeutic regimen will improve Outcome: Progressing   Problem: Coping: Goal: Ability to identify and develop effective coping behavior will improve Outcome: Progressing   Problem: Clinical Measurements: Goal: Quality of life will improve Outcome: Progressing   Problem: Respiratory: Goal: Verbalizations of increased ease of respirations will increase Outcome: Progressing   Problem: Role Relationship: Goal: Family's ability to cope with current situation will improve Outcome: Progressing   Problem: Pain Management: Goal: Satisfaction with pain management regimen will improve Outcome: Progressing

## 2022-06-23 NOTE — Discharge Summary (Signed)
Physician Discharge Summary  Gregory Christensen MBT:[id number] DOB: July 11, 1950 DOA: 06/22/2022  PCP: Amelia Jo, PA  Admit date: 06/22/2022 Discharge date: 06/23/2022 Discharging to: hospice home Recommendations for Outpatient Follow-up:  Continue Dilaudid infusion     Discharge Diagnoses:   Principal Problem:   AKI (acute kidney injury) ([place]) Active Problems:   Pulmonary embolism ([place])   DM (diabetes mellitus) ([place])   CKD stage 3a   COPD (chronic obstructive pulmonary disease) ([place])   Essential hypertension   Chronic respiratory failure with hypoxia ([place])   Urothelial carcinoma ([place])   UTI (urinary tract infection)   Back pain     Hospital Course:  This is a 71 year old male with urothelial carcinoma with spinal mets, COPD, chronic hypoxic respiratory failure on 2 L of oxygen, hypertension, diabetes mellitus, history of PE and DVT who presents to the hospital for progressive back pain and poor oral intake. In the ED, he was noted to be dehydrated.  He was transition to comfort care.  Principal Problem:   AKI (acute kidney injury) ([place]) Active Problems:   Pulmonary embolism ([facility])   DM (diabetes mellitus) ([place])   CKD stage 3a   COPD (chronic obstructive pulmonary disease) ([place])   Essential hypertension   Chronic respiratory failure with hypoxia (H[facility])   Urothelial carcinoma (H[facility])   UTI (urinary tract infection)   Back pain   [name] is now comfort care.  He is relatively alert and has had a small amount of breakfast this morning.  His pain was quite severe and we have placed him on a Dilaudid PCA which appears to be helping control most of his pain.  I have discussed transitioning him to [facility] with his daughter, ex-wife and his granddaughter and they are all in agreement with this plan.          Discharge Instructions   Allergies as of 06/23/2022   No Known Allergies      Medication List     STOP taking these medications    albuterol (2.5 MG/3ML)  0.083% nebulizer solution Commonly known as: PROVENTIL   albuterol 108 (90 Base) MCG/ACT inhaler Commonly known as: VENTOLIN HFA   ammonium lactate 12 % lotion Commonly known as: LAC-HYDRIN   apixaban 5 MG Tabs tablet Commonly known as: Eliquis   ascorbic acid 500 MG tablet Commonly known as: VITAMIN C   atorvastatin 80 MG tablet Commonly known as: LIPITOR   buPROPion 150 MG 12 hr tablet Commonly known as: WELLBUTRIN SR   diclofenac Sodium 1 % Gel Commonly known as: VOLTAREN   docusate sodium 100 MG capsule Commonly known as: COLACE   ezetimibe 10 MG tablet Commonly known as: ZETIA   ferrous sulfate 325 (65 FE) MG tablet   finasteride 5 MG tablet Commonly known as: PROSCAR   fluticasone 50 MCG/ACT nasal spray Commonly known as: FLONASE   gabapentin 300 MG capsule Commonly known as: NEURONTIN   levocetirizine 5 MG tablet Commonly known as: XYZAL   lidocaine 5 % Commonly known as: LIDODERM   meclizine 25 MG tablet Commonly known as: ANTIVERT   methenamine 1 g tablet Commonly known as: MANDELAMINE   metoprolol succinate 25 MG 24 hr tablet Commonly known as: Toprol XL   montelukast 10 MG tablet Commonly known as: SINGULAIR   Narcan 4 MG/0.1ML Liqd nasal spray kit Generic drug: naloxone   nicotine 21 mg/24hr patch Commonly known as: NICODERM CQ - dosed in mg/24 hours   nitroGLYCERIN 0.4 MG SL tablet Commonly known as:  NITROSTAT   oxyCODONE-acetaminophen 10-325 MG tablet Commonly known as: Percocet   pantoprazole 40 MG tablet Commonly known as: Protonix   senna-docusate 8.6-50 MG tablet Commonly known as: Senokot-S   sildenafil 100 MG tablet Commonly known as: VIAGRA   Spiriva Respimat 2.5 MCG/ACT Aers Generic drug: Tiotropium Bromide Monohydrate   tamsulosin 0.4 MG Caps capsule Commonly known as: FLOMAX   Wixela Inhub 250-50 MCG/ACT Aepb Generic drug: fluticasone-salmeterol       TAKE these medications    HYDROmorphone 1 mg/mL  injection Commonly known as: DILAUDID Inject 1 mL (1 mg total) into the vein every 4 (four) hours.            The results of significant diagnostics from this hospitalization (including imaging, microbiology, ancillary and laboratory) are listed below for reference.    CT ABDOMEN PELVIS WO CONTRAST  Result Date: 06/22/2022 CLINICAL DATA:  Abdominal pain and dehydration. History of left renal cell carcinoma. * Tracking Code: BO * EXAM: CT ABDOMEN AND PELVIS WITHOUT CONTRAST TECHNIQUE: Multidetector CT imaging of the abdomen and pelvis was performed following the standard protocol without IV contrast. RADIATION DOSE REDUCTION: This exam was performed according to the departmental dose-optimization program which includes automated exposure control, adjustment of the mA and/or kV according to patient size and/or use of iterative reconstruction technique. COMPARISON:  03/26/2022 FINDINGS: Lower chest: Centrilobular emphsyema noted. Hepatobiliary: No suspicious focal abnormality in the liver on this study without intravenous contrast. Gallbladder is distended. No intrahepatic or extrahepatic biliary dilation. Pancreas: No focal mass lesion. No dilatation of the main duct. No intraparenchymal cyst. No peripancreatic edema. Spleen: No splenomegaly. No focal mass lesion. Adrenals/Urinary Tract: Right adrenal gland stable. Left adrenal gland not well seen. Right kidney unremarkable. Left kidney surgically absent. No right hydroureter. Foley catheter identified in the urinary bladder. Stomach/Bowel: Stomach is unremarkable. No gastric wall thickening. No evidence of outlet obstruction. Duodenum is normally positioned as is the ligament of Treitz. No small bowel wall thickening. No small bowel dilatation. The terminal ileum is normal. The appendix is not well visualized, but there is no edema or inflammation in the region of the cecum. No gross colonic mass. No colonic wall thickening. Scattered diverticuli noted  without diverticulitis. Vascular/Lymphatic: There is mild atherosclerotic calcification of the abdominal aorta without aneurysm. There is no gastrohepatic or hepatoduodenal ligament lymphadenopathy. No retroperitoneal or mesenteric lymphadenopathy. No pelvic sidewall lymphadenopathy. Reproductive: The prostate gland and seminal vesicles are unremarkable. Other: No intraperitoneal free fluid. Musculoskeletal: No worrisome lytic or sclerotic osseous abnormality. Interval progression of a 2.8 cm lytic lesion in the left pubic bone (69/2). 2 cm sclerotic lesion identified right femoral head on 67/2, similar to prior. 2.4 cm lytic lesion in the right aspect of the T11 vertebral body is progressive in the interval, now with evidence of epidural tumor spread anteriorly (image 10/series 2). IMPRESSION: 1. Interval progression of a 2.4 cm lytic lesion in the right aspect of the T11 vertebral body, now with evidence of epidural tumor spread anteriorly. Consider thoracic MRI with and without contrast to further evaluate. 2. Interval progression of a 2.8 cm lytic lesion in the left pubic bone consistent with metastatic deposit. 3. Indeterminate sclerotic lesion right femoral head is stable. Although a somewhat atypical location, this may represent a vascular necrosis. Metastatic deposit not excluded. 4. Status post left nephrectomy. 5.  Aortic Atherosclerosis (ICD10-I70.0). Electronically Signed   By: Misty Stanley M.D.   On: 06/22/2022 13:39   DG Chest Portable 1 View  Result Date: 06/06/2022 CLINICAL DATA:  Provided history: Shortness of breath. EXAM: PORTABLE CHEST 1 VIEW COMPARISON:  Prior chest radiographs 05/24/2022 and earlier. Chest CT 03/26/2022. FINDINGS: Heart size is normal limits. Redemonstrated focus of subpleural consolidation within the periphery of the right upper lobe, more fully characterized on the prior chest CT of 03/26/2022. Minimal atelectasis within the left lung base. No appreciable airspace  consolidation. No evidence of pleural effusion or pneumothorax. Degenerative changes of the spine. IMPRESSION: Redemonstrated focus of subpleural consolidation within the periphery of the right upper lobe, more fully characterized on the prior chest CT of 03/26/2022. Please refer to this prior examination for further description and for follow-up recommendations. No appreciable airspace consolidation elsewhere. Minimal atelectasis within the left lung base. Electronically Signed   By: Kellie Simmering D.O.   On: 06/06/2022 13:11   US RENAL  Result Date: 05/26/2022 CLINICAL DATA:  Acute kidney injury.  Status post left nephrectomy. EXAM: RENAL / URINARY TRACT ULTRASOUND COMPLETE COMPARISON:  October 21, 2017.  March 26, 2022. FINDINGS: Right Kidney: Renal measurements: 10.1 x 4.2 x 3.9 cm = volume: 87 mL. Echogenicity within normal limits. No mass or hydronephrosis visualized. Status post left nephrectomy. Bladder: Decompressed secondary to Foley catheter. Other: None. IMPRESSION: Normal right kidney.  Status post left nephrectomy. Electronically Signed   By: Marijo Conception M.D.   On: 05/26/2022 09:58   DG Abd Portable 1V  Result Date: 05/25/2022 CLINICAL DATA:  Generalized abdominal pain. EXAM: PORTABLE ABDOMEN - 1 VIEW COMPARISON:  None Available. FINDINGS: The bowel gas pattern is normal. No radio-opaque calculi or other significant radiographic abnormality are seen. IMPRESSION: Negative. Electronically Signed   By: Marijo Conception M.D.   On: 05/25/2022 11:59   Labs:   Basic Metabolic Panel: Recent Labs  Lab 06/22/22 1159 06/22/22 1913  NA 137 135  K 5.3* 4.6  CL 105 108  CO2 21* 19*  GLUCOSE 111* 113*  BUN 36* 32*  CREATININE 2.71* 2.15*  CALCIUM 9.7 8.6*     CBC: Recent Labs  Lab 06/22/22 1159 06/22/22 1913  WBC 7.8 7.5  HGB 14.0 11.8*  HCT 45.1 37.2*  MCV 90.6 88.8  PLT 215 225         SIGNED:   Debbe Odea, MD  [facility] 06/23/2022, 3:48 PM

## 2022-06-23 NOTE — Progress Notes (Signed)
[facility] Progress Note  Patient: Gregory Christensen     ZOX:[id number]  DOA: 06/22/2022   PCP: Amelia Jo, PA       Brief hospital course: This is a 71 year old male with urothelial carcinoma with spinal mets, COPD, chronic hypoxic respiratory failure on 2 L of oxygen, hypertension, diabetes mellitus, history of PE and DVT who presents to the hospital for progressive back pain and poor oral intake. In the ED, he was noted to be dehydrated.  He was transition to comfort care.  Subjective:  I was told this morning that he was having severe pain in his back which was not being resolved by as needed Dilaudid.  He was also having generalized itching which started prior to being given narcotics in the hospital.  Assessment and Plan: Principal Problem:   AKI (acute kidney injury) ([place]) Active Problems:   Pulmonary embolism ([facility])   DM (diabetes mellitus) ([place])   CKD stage 3a   COPD (chronic obstructive pulmonary disease) ([place])   Essential hypertension   Chronic respiratory failure with hypoxia ([place])   Urothelial carcinoma ([place])   UTI (urinary tract infection)   Back pain  Mr. Scott is now comfort care.  He is relatively alert and has had a small amount of breakfast this morning.  His pain was quite severe and we have placed him on a Dilaudid PCA which appears to be helping control most of his pain.  I have discussed transitioning him to [facility] with his daughter, ex-wife and his granddaughter and they are all in agreement with this plan.  I have reached out to case management to see if he is a candidate for [facility].     Code Status: DNR Consultants: None Level of Care: Level of care: Med-Surg Total time on patient care: 30 minutes  Objective:   Vitals:   06/22/22 1822 06/22/22 2125 06/23/22 0820 06/23/22 1252  BP: (!) 92/47 (!) 82/51    Pulse:  78    Resp: (!) '24 14 17 16  '$ Temp: (!) 97.4 F (36.3 C) 97.7 F (36.5 C)    TempSrc: Oral Oral    SpO2:  100% 99%  99%   There were no vitals filed for this visit. Exam: General exam: Appears comfortable-appears slightly confused and is having some difficulty responding to questions HEENT: oral mucosa moist Respiratory system: Clear to auscultation.  Cardiovascular system: S1 & S2 heard  Gastrointestinal system: Abdomen soft, non-tender, nondistended. Normal bowel sounds   Extremities: No cyanosis, clubbing or edema Psychiatry: Flat affect    CBC: Recent Labs  Lab 06/22/22 1159 06/22/22 1913  WBC 7.8 7.5  HGB 14.0 11.8*  HCT 45.1 37.2*  MCV 90.6 88.8  PLT 215 811   Basic Metabolic Panel: Recent Labs  Lab 06/22/22 1159 06/22/22 1913  NA 137 135  K 5.3* 4.6  CL 105 108  CO2 21* 19*  GLUCOSE 111* 113*  BUN 36* 32*  CREATININE 2.71* 2.15*  CALCIUM 9.7 8.6*   GFR: Estimated Creatinine Clearance: 31.3 mL/min (A) (by C-G formula based on SCr of 2.15 mg/dL (H)).  Scheduled Meds:  HYDROmorphone   Intravenous Q4H   Continuous Infusions:  sodium chloride     Imaging and lab data was personally reviewed CT ABDOMEN PELVIS WO CONTRAST  Result Date: 06/22/2022 CLINICAL DATA:  Abdominal pain and dehydration. History of left renal cell carcinoma. * Tracking Code: BO * EXAM: CT ABDOMEN AND PELVIS WITHOUT CONTRAST TECHNIQUE: Multidetector CT imaging of the abdomen and  pelvis was performed following the standard protocol without IV contrast. RADIATION DOSE REDUCTION: This exam was performed according to the departmental dose-optimization program which includes automated exposure control, adjustment of the mA and/or kV according to patient size and/or use of iterative reconstruction technique. COMPARISON:  03/26/2022 FINDINGS: Lower chest: Centrilobular emphsyema noted. Hepatobiliary: No suspicious focal abnormality in the liver on this study without intravenous contrast. Gallbladder is distended. No intrahepatic or extrahepatic biliary dilation. Pancreas: No focal mass lesion. No dilatation of the  main duct. No intraparenchymal cyst. No peripancreatic edema. Spleen: No splenomegaly. No focal mass lesion. Adrenals/Urinary Tract: Right adrenal gland stable. Left adrenal gland not well seen. Right kidney unremarkable. Left kidney surgically absent. No right hydroureter. Foley catheter identified in the urinary bladder. Stomach/Bowel: Stomach is unremarkable. No gastric wall thickening. No evidence of outlet obstruction. Duodenum is normally positioned as is the ligament of Treitz. No small bowel wall thickening. No small bowel dilatation. The terminal ileum is normal. The appendix is not well visualized, but there is no edema or inflammation in the region of the cecum. No gross colonic mass. No colonic wall thickening. Scattered diverticuli noted without diverticulitis. Vascular/Lymphatic: There is mild atherosclerotic calcification of the abdominal aorta without aneurysm. There is no gastrohepatic or hepatoduodenal ligament lymphadenopathy. No retroperitoneal or mesenteric lymphadenopathy. No pelvic sidewall lymphadenopathy. Reproductive: The prostate gland and seminal vesicles are unremarkable. Other: No intraperitoneal free fluid. Musculoskeletal: No worrisome lytic or sclerotic osseous abnormality. Interval progression of a 2.8 cm lytic lesion in the left pubic bone (69/2). 2 cm sclerotic lesion identified right femoral head on 67/2, similar to prior. 2.4 cm lytic lesion in the right aspect of the T11 vertebral body is progressive in the interval, now with evidence of epidural tumor spread anteriorly (image 10/series 2). IMPRESSION: 1. Interval progression of a 2.4 cm lytic lesion in the right aspect of the T11 vertebral body, now with evidence of epidural tumor spread anteriorly. Consider thoracic MRI with and without contrast to further evaluate. 2. Interval progression of a 2.8 cm lytic lesion in the left pubic bone consistent with metastatic deposit. 3. Indeterminate sclerotic lesion right femoral head  is stable. Although a somewhat atypical location, this may represent a vascular necrosis. Metastatic deposit not excluded. 4. Status post left nephrectomy. 5.  Aortic Atherosclerosis (ICD10-I70.0). Electronically Signed   By: Misty Stanley M.D.   On: 06/22/2022 13:39    LOS: 1 day   Author: Debbe Odea  06/23/2022 1:07 PM  To contact [facility]>   Check the care team in [facility] and look for the attending/consulting [place] provider listed  Log into [email or web address] and use [facility]'s universal password   Go to> "[facility]"  and find provider  If you still have difficulty reaching the provider, please page the [facility] (Director on Call) for the Hospitalists listed on amion

## 2022-06-23 NOTE — Progress Notes (Signed)
Patient to be transferred [facility], room #5. Report already given to nurse this afternoon. Awaiting for PCA/drip to be delivered to facility prior to transport. Once delivered, they will call us so that PTAR/transportation can be arranged. Just called to verify-still not delivered yet. Family aware and updated.

## 2022-06-26 ENCOUNTER — Telehealth (HOSPITAL_COMMUNITY): Payer: Self-pay

## 2022-06-26 NOTE — Telephone Encounter (Signed)
-----   Message from Pedro Earls, MD sent at 05/08/2022  8:45 AM EST ----- Regarding: RE: Kyphoplasty? Hi Ash,  This should be an osteocool case (+ biopsy and kypho).  Thanks, Wendelyn Breslow  ----- Message ----- From: Danielle Dess Sent: 05/07/2022   1:37 PM EST To: Pedro Earls, MD; # Subject: Kyphoplasty?                                   Wendelyn Breslow,   Referral from Dr. Alen Blew. The pt has a T11 spinal mass and they have ordered a KP. Please review.   Thanks,  Lia Foyer

## 2022-06-27 ENCOUNTER — Other Ambulatory Visit: Payer: Self-pay | Admitting: Radiology

## 2022-06-27 DIAGNOSIS — M489 Spondylopathy, unspecified: Secondary | ICD-10-CM

## 2022-06-28 ENCOUNTER — Ambulatory Visit (HOSPITAL_COMMUNITY): Admission: RE | Admit: 2022-06-28 | Payer: Non-veteran care | Source: Ambulatory Visit

## 2022-06-28 ENCOUNTER — Telehealth (HOSPITAL_COMMUNITY): Payer: Self-pay

## 2022-06-28 NOTE — Telephone Encounter (Signed)
Called pt's daughter to find out if they were coming today for procedure. Pt passed away on Christmas day. Gave my condolences. AW

## 2022-07-02 DEATH — deceased

## 2022-08-30 NOTE — Telephone Encounter (Signed)
Error
